# Patient Record
Sex: Female | Born: 1960
Health system: Southern US, Community
[De-identification: ages and names within clinical notes are randomized; demographics above are authoritative.]

## PROBLEM LIST (undated history)

## (undated) DIAGNOSIS — J189 Pneumonia, unspecified organism: Secondary | ICD-10-CM

## (undated) DIAGNOSIS — R918 Other nonspecific abnormal finding of lung field: Secondary | ICD-10-CM

## (undated) DIAGNOSIS — K589 Irritable bowel syndrome without diarrhea: Secondary | ICD-10-CM

## (undated) DIAGNOSIS — F32A Depression, unspecified: Secondary | ICD-10-CM

## (undated) DIAGNOSIS — E119 Type 2 diabetes mellitus without complications: Secondary | ICD-10-CM

## (undated) DIAGNOSIS — I499 Cardiac arrhythmia, unspecified: Secondary | ICD-10-CM

## (undated) DIAGNOSIS — I639 Cerebral infarction, unspecified: Secondary | ICD-10-CM

## (undated) DIAGNOSIS — M1990 Unspecified osteoarthritis, unspecified site: Secondary | ICD-10-CM

## (undated) DIAGNOSIS — G4730 Sleep apnea, unspecified: Secondary | ICD-10-CM

## (undated) DIAGNOSIS — F79 Unspecified intellectual disabilities: Secondary | ICD-10-CM

## (undated) DIAGNOSIS — R011 Cardiac murmur, unspecified: Secondary | ICD-10-CM

## (undated) DIAGNOSIS — K219 Gastro-esophageal reflux disease without esophagitis: Secondary | ICD-10-CM

## (undated) DIAGNOSIS — F419 Anxiety disorder, unspecified: Secondary | ICD-10-CM

## (undated) DIAGNOSIS — I10 Essential (primary) hypertension: Secondary | ICD-10-CM

## (undated) DIAGNOSIS — G43909 Migraine, unspecified, not intractable, without status migrainosus: Secondary | ICD-10-CM

## (undated) DIAGNOSIS — R0600 Dyspnea, unspecified: Secondary | ICD-10-CM

## (undated) DIAGNOSIS — C3490 Malignant neoplasm of unspecified part of unspecified bronchus or lung: Secondary | ICD-10-CM

## (undated) DIAGNOSIS — I219 Acute myocardial infarction, unspecified: Secondary | ICD-10-CM

## (undated) DIAGNOSIS — R739 Hyperglycemia, unspecified: Secondary | ICD-10-CM

## (undated) HISTORY — PX: GLAUCOMA SURGERY: SHX656

## (undated) HISTORY — PX: ABDOMINAL HYSTERECTOMY: SHX81

## (undated) HISTORY — PX: FOOT SURGERY: SHX648

## (undated) HISTORY — DX: Migraine, unspecified, not intractable, without status migrainosus: G43.909

## (undated) HISTORY — DX: Hyperglycemia, unspecified: R73.9

## (undated) HISTORY — DX: Anxiety disorder, unspecified: F41.9

## (undated) HISTORY — DX: Unspecified intellectual disabilities: F79

## (undated) HISTORY — DX: Sleep apnea, unspecified: G47.30

## (undated) HISTORY — PX: EXTERNAL EAR SURGERY: SHX627

## (undated) HISTORY — DX: Type 2 diabetes mellitus without complications: E11.9

## (undated) HISTORY — DX: Malignant neoplasm of unspecified part of unspecified bronchus or lung: C34.90

## (undated) HISTORY — DX: Irritable bowel syndrome, unspecified: K58.9

## (undated) HISTORY — DX: Essential (primary) hypertension: I10

---

## 2001-05-15 ENCOUNTER — Ambulatory Visit (HOSPITAL_COMMUNITY): Admission: RE | Admit: 2001-05-15 | Discharge: 2001-05-15 | Payer: Self-pay | Admitting: Internal Medicine

## 2001-05-15 ENCOUNTER — Encounter: Payer: Self-pay | Admitting: Obstetrics and Gynecology

## 2001-05-27 ENCOUNTER — Encounter: Payer: Self-pay | Admitting: Family Medicine

## 2001-05-27 ENCOUNTER — Ambulatory Visit (HOSPITAL_COMMUNITY): Admission: RE | Admit: 2001-05-27 | Discharge: 2001-05-27 | Payer: Self-pay | Admitting: Family Medicine

## 2001-06-02 ENCOUNTER — Encounter: Payer: Self-pay | Admitting: Internal Medicine

## 2001-06-02 ENCOUNTER — Emergency Department (HOSPITAL_COMMUNITY): Admission: EM | Admit: 2001-06-02 | Discharge: 2001-06-02 | Payer: Self-pay | Admitting: Internal Medicine

## 2001-06-20 ENCOUNTER — Ambulatory Visit (HOSPITAL_COMMUNITY): Admission: RE | Admit: 2001-06-20 | Discharge: 2001-06-20 | Payer: Self-pay | Admitting: Family Medicine

## 2001-06-20 ENCOUNTER — Encounter: Payer: Self-pay | Admitting: Family Medicine

## 2001-08-08 ENCOUNTER — Inpatient Hospital Stay (HOSPITAL_COMMUNITY): Admission: RE | Admit: 2001-08-08 | Discharge: 2001-08-12 | Payer: Self-pay | Admitting: Obstetrics and Gynecology

## 2001-08-10 ENCOUNTER — Encounter: Payer: Self-pay | Admitting: Obstetrics and Gynecology

## 2001-10-08 ENCOUNTER — Encounter: Payer: Self-pay | Admitting: Obstetrics and Gynecology

## 2001-10-08 ENCOUNTER — Ambulatory Visit (HOSPITAL_COMMUNITY): Admission: RE | Admit: 2001-10-08 | Discharge: 2001-10-08 | Payer: Self-pay | Admitting: Obstetrics and Gynecology

## 2001-12-11 ENCOUNTER — Ambulatory Visit (HOSPITAL_COMMUNITY): Admission: RE | Admit: 2001-12-11 | Discharge: 2001-12-11 | Payer: Self-pay | Admitting: Obstetrics and Gynecology

## 2001-12-11 ENCOUNTER — Encounter: Payer: Self-pay | Admitting: Obstetrics and Gynecology

## 2002-01-22 ENCOUNTER — Encounter: Payer: Self-pay | Admitting: Family Medicine

## 2002-01-22 ENCOUNTER — Ambulatory Visit (HOSPITAL_COMMUNITY): Admission: RE | Admit: 2002-01-22 | Discharge: 2002-01-22 | Payer: Self-pay | Admitting: Family Medicine

## 2002-02-13 ENCOUNTER — Ambulatory Visit (HOSPITAL_COMMUNITY): Admission: RE | Admit: 2002-02-13 | Discharge: 2002-02-13 | Payer: Self-pay | Admitting: Internal Medicine

## 2002-02-17 ENCOUNTER — Encounter: Payer: Self-pay | Admitting: Internal Medicine

## 2002-02-17 ENCOUNTER — Ambulatory Visit (HOSPITAL_COMMUNITY): Admission: RE | Admit: 2002-02-17 | Discharge: 2002-02-17 | Payer: Self-pay | Admitting: Internal Medicine

## 2002-02-27 ENCOUNTER — Ambulatory Visit (HOSPITAL_COMMUNITY): Admission: RE | Admit: 2002-02-27 | Discharge: 2002-02-27 | Payer: Self-pay | Admitting: Internal Medicine

## 2002-02-27 ENCOUNTER — Encounter: Payer: Self-pay | Admitting: Internal Medicine

## 2002-03-20 ENCOUNTER — Ambulatory Visit (HOSPITAL_COMMUNITY): Admission: RE | Admit: 2002-03-20 | Discharge: 2002-03-20 | Payer: Self-pay | Admitting: Internal Medicine

## 2002-03-20 ENCOUNTER — Encounter: Payer: Self-pay | Admitting: Internal Medicine

## 2002-04-11 ENCOUNTER — Encounter: Payer: Self-pay | Admitting: Internal Medicine

## 2002-04-11 ENCOUNTER — Ambulatory Visit (HOSPITAL_COMMUNITY): Admission: RE | Admit: 2002-04-11 | Discharge: 2002-04-11 | Payer: Self-pay | Admitting: Internal Medicine

## 2002-05-05 ENCOUNTER — Ambulatory Visit (HOSPITAL_COMMUNITY): Admission: RE | Admit: 2002-05-05 | Discharge: 2002-05-05 | Payer: Self-pay | Admitting: Internal Medicine

## 2002-05-05 HISTORY — PX: COLONOSCOPY: SHX174

## 2002-09-09 ENCOUNTER — Ambulatory Visit (HOSPITAL_COMMUNITY): Admission: RE | Admit: 2002-09-09 | Discharge: 2002-09-09 | Payer: Self-pay | Admitting: Internal Medicine

## 2002-09-09 ENCOUNTER — Encounter: Payer: Self-pay | Admitting: Internal Medicine

## 2002-10-31 ENCOUNTER — Ambulatory Visit (HOSPITAL_COMMUNITY): Admission: RE | Admit: 2002-10-31 | Discharge: 2002-10-31 | Payer: Self-pay | Admitting: Internal Medicine

## 2002-10-31 ENCOUNTER — Encounter: Payer: Self-pay | Admitting: Internal Medicine

## 2002-12-29 ENCOUNTER — Encounter: Payer: Self-pay | Admitting: Family Medicine

## 2002-12-29 ENCOUNTER — Encounter (HOSPITAL_COMMUNITY): Admission: RE | Admit: 2002-12-29 | Discharge: 2003-01-28 | Payer: Self-pay | Admitting: Family Medicine

## 2003-02-05 ENCOUNTER — Ambulatory Visit (HOSPITAL_COMMUNITY): Admission: RE | Admit: 2003-02-05 | Discharge: 2003-02-05 | Payer: Self-pay | Admitting: Podiatry

## 2003-03-13 ENCOUNTER — Other Ambulatory Visit: Admission: RE | Admit: 2003-03-13 | Discharge: 2003-03-13 | Payer: Self-pay | Admitting: Obstetrics and Gynecology

## 2003-03-20 ENCOUNTER — Inpatient Hospital Stay (HOSPITAL_COMMUNITY): Admission: RE | Admit: 2003-03-20 | Discharge: 2003-03-24 | Payer: Self-pay | Admitting: Obstetrics and Gynecology

## 2003-04-15 ENCOUNTER — Ambulatory Visit (HOSPITAL_COMMUNITY): Admission: RE | Admit: 2003-04-15 | Discharge: 2003-04-15 | Payer: Self-pay | Admitting: Obstetrics and Gynecology

## 2003-04-15 ENCOUNTER — Encounter: Payer: Self-pay | Admitting: Obstetrics and Gynecology

## 2003-09-01 ENCOUNTER — Ambulatory Visit (HOSPITAL_COMMUNITY): Admission: RE | Admit: 2003-09-01 | Discharge: 2003-09-01 | Payer: Self-pay | Admitting: Family Medicine

## 2003-10-22 ENCOUNTER — Encounter (HOSPITAL_COMMUNITY): Admission: RE | Admit: 2003-10-22 | Discharge: 2003-11-21 | Payer: Self-pay | Admitting: Internal Medicine

## 2003-10-30 ENCOUNTER — Ambulatory Visit (HOSPITAL_COMMUNITY): Admission: RE | Admit: 2003-10-30 | Discharge: 2003-10-30 | Payer: Self-pay | Admitting: Family Medicine

## 2003-11-23 ENCOUNTER — Ambulatory Visit (HOSPITAL_COMMUNITY): Admission: RE | Admit: 2003-11-23 | Discharge: 2003-11-23 | Payer: Self-pay | Admitting: Internal Medicine

## 2004-04-18 ENCOUNTER — Ambulatory Visit (HOSPITAL_COMMUNITY): Admission: RE | Admit: 2004-04-18 | Discharge: 2004-04-18 | Payer: Self-pay | Admitting: Family Medicine

## 2004-04-23 ENCOUNTER — Emergency Department (HOSPITAL_COMMUNITY): Admission: EM | Admit: 2004-04-23 | Discharge: 2004-04-23 | Payer: Self-pay | Admitting: Emergency Medicine

## 2004-07-21 ENCOUNTER — Ambulatory Visit: Payer: Self-pay | Admitting: Internal Medicine

## 2004-07-27 ENCOUNTER — Ambulatory Visit (HOSPITAL_COMMUNITY): Admission: RE | Admit: 2004-07-27 | Discharge: 2004-07-27 | Payer: Self-pay | Admitting: Family Medicine

## 2004-11-12 ENCOUNTER — Emergency Department (HOSPITAL_COMMUNITY): Admission: EM | Admit: 2004-11-12 | Discharge: 2004-11-12 | Payer: Self-pay | Admitting: Emergency Medicine

## 2005-01-19 ENCOUNTER — Ambulatory Visit (HOSPITAL_COMMUNITY): Admission: RE | Admit: 2005-01-19 | Discharge: 2005-01-19 | Payer: Self-pay | Admitting: Family Medicine

## 2005-02-08 ENCOUNTER — Ambulatory Visit: Payer: Self-pay | Admitting: Internal Medicine

## 2005-02-13 ENCOUNTER — Ambulatory Visit: Payer: Self-pay | Admitting: Internal Medicine

## 2005-03-16 ENCOUNTER — Ambulatory Visit: Payer: Self-pay | Admitting: Internal Medicine

## 2005-04-20 ENCOUNTER — Ambulatory Visit (HOSPITAL_COMMUNITY): Admission: RE | Admit: 2005-04-20 | Discharge: 2005-04-20 | Payer: Self-pay | Admitting: Family Medicine

## 2005-04-21 ENCOUNTER — Emergency Department (HOSPITAL_COMMUNITY): Admission: EM | Admit: 2005-04-21 | Discharge: 2005-04-22 | Payer: Self-pay | Admitting: Emergency Medicine

## 2005-07-13 ENCOUNTER — Ambulatory Visit: Payer: Self-pay | Admitting: Internal Medicine

## 2005-08-21 ENCOUNTER — Emergency Department (HOSPITAL_COMMUNITY): Admission: EM | Admit: 2005-08-21 | Discharge: 2005-08-21 | Payer: Self-pay | Admitting: Emergency Medicine

## 2005-09-17 ENCOUNTER — Emergency Department (HOSPITAL_COMMUNITY): Admission: EM | Admit: 2005-09-17 | Discharge: 2005-09-18 | Payer: Self-pay | Admitting: Emergency Medicine

## 2005-12-08 ENCOUNTER — Emergency Department (HOSPITAL_COMMUNITY): Admission: EM | Admit: 2005-12-08 | Discharge: 2005-12-08 | Payer: Self-pay | Admitting: Emergency Medicine

## 2006-01-31 ENCOUNTER — Observation Stay (HOSPITAL_COMMUNITY): Admission: EM | Admit: 2006-01-31 | Discharge: 2006-02-01 | Payer: Self-pay | Admitting: Emergency Medicine

## 2006-02-17 ENCOUNTER — Emergency Department (HOSPITAL_COMMUNITY): Admission: EM | Admit: 2006-02-17 | Discharge: 2006-02-17 | Payer: Self-pay | Admitting: Emergency Medicine

## 2006-02-26 ENCOUNTER — Ambulatory Visit (HOSPITAL_COMMUNITY): Admission: RE | Admit: 2006-02-26 | Discharge: 2006-02-26 | Payer: Self-pay | Admitting: Family Medicine

## 2006-04-23 ENCOUNTER — Ambulatory Visit (HOSPITAL_COMMUNITY): Admission: RE | Admit: 2006-04-23 | Discharge: 2006-04-23 | Payer: Self-pay | Admitting: Family Medicine

## 2006-09-03 ENCOUNTER — Emergency Department (HOSPITAL_COMMUNITY): Admission: EM | Admit: 2006-09-03 | Discharge: 2006-09-03 | Payer: Self-pay | Admitting: Emergency Medicine

## 2006-09-13 ENCOUNTER — Observation Stay (HOSPITAL_COMMUNITY): Admission: RE | Admit: 2006-09-13 | Discharge: 2006-09-15 | Payer: Self-pay | Admitting: Obstetrics and Gynecology

## 2007-04-25 ENCOUNTER — Ambulatory Visit: Payer: Self-pay | Admitting: Internal Medicine

## 2007-04-29 ENCOUNTER — Ambulatory Visit (HOSPITAL_COMMUNITY): Admission: RE | Admit: 2007-04-29 | Discharge: 2007-04-29 | Payer: Self-pay | Admitting: Family Medicine

## 2007-05-07 ENCOUNTER — Ambulatory Visit (HOSPITAL_COMMUNITY): Admission: RE | Admit: 2007-05-07 | Discharge: 2007-05-07 | Payer: Self-pay | Admitting: Family Medicine

## 2007-06-07 ENCOUNTER — Emergency Department (HOSPITAL_COMMUNITY): Admission: EM | Admit: 2007-06-07 | Discharge: 2007-06-07 | Payer: Self-pay | Admitting: Emergency Medicine

## 2007-06-19 ENCOUNTER — Emergency Department (HOSPITAL_COMMUNITY): Admission: EM | Admit: 2007-06-19 | Discharge: 2007-06-19 | Payer: Self-pay | Admitting: Emergency Medicine

## 2007-07-18 ENCOUNTER — Emergency Department (HOSPITAL_COMMUNITY): Admission: EM | Admit: 2007-07-18 | Discharge: 2007-07-18 | Payer: Self-pay | Admitting: Emergency Medicine

## 2007-08-08 ENCOUNTER — Ambulatory Visit: Payer: Self-pay | Admitting: Internal Medicine

## 2007-08-11 HISTORY — PX: ESOPHAGOGASTRODUODENOSCOPY: SHX1529

## 2007-08-15 ENCOUNTER — Ambulatory Visit (HOSPITAL_COMMUNITY): Admission: RE | Admit: 2007-08-15 | Discharge: 2007-08-15 | Payer: Self-pay | Admitting: Internal Medicine

## 2007-08-15 ENCOUNTER — Ambulatory Visit: Payer: Self-pay | Admitting: Internal Medicine

## 2007-08-20 ENCOUNTER — Encounter (HOSPITAL_COMMUNITY): Admission: RE | Admit: 2007-08-20 | Discharge: 2007-09-19 | Payer: Self-pay | Admitting: Internal Medicine

## 2007-09-17 ENCOUNTER — Ambulatory Visit: Payer: Self-pay | Admitting: Internal Medicine

## 2007-10-09 HISTORY — PX: OTHER SURGICAL HISTORY: SHX169

## 2007-10-25 ENCOUNTER — Emergency Department (HOSPITAL_COMMUNITY): Admission: EM | Admit: 2007-10-25 | Discharge: 2007-10-25 | Payer: Self-pay | Admitting: Emergency Medicine

## 2007-10-28 ENCOUNTER — Ambulatory Visit: Payer: Self-pay | Admitting: Gastroenterology

## 2007-11-08 ENCOUNTER — Encounter: Payer: Self-pay | Admitting: Internal Medicine

## 2007-11-08 ENCOUNTER — Ambulatory Visit (HOSPITAL_COMMUNITY): Admission: RE | Admit: 2007-11-08 | Discharge: 2007-11-08 | Payer: Self-pay | Admitting: Internal Medicine

## 2007-11-08 ENCOUNTER — Ambulatory Visit: Payer: Self-pay | Admitting: Internal Medicine

## 2007-11-08 HISTORY — PX: COLONOSCOPY: SHX174

## 2007-12-25 ENCOUNTER — Emergency Department (HOSPITAL_COMMUNITY): Admission: EM | Admit: 2007-12-25 | Discharge: 2007-12-25 | Payer: Self-pay | Admitting: Emergency Medicine

## 2008-02-19 ENCOUNTER — Ambulatory Visit: Admission: RE | Admit: 2008-02-19 | Discharge: 2008-02-19 | Payer: Self-pay | Admitting: Family Medicine

## 2008-04-08 ENCOUNTER — Ambulatory Visit: Payer: Self-pay | Admitting: Internal Medicine

## 2008-04-15 ENCOUNTER — Other Ambulatory Visit: Payer: Self-pay

## 2008-04-15 ENCOUNTER — Inpatient Hospital Stay (HOSPITAL_COMMUNITY): Admission: RE | Admit: 2008-04-15 | Discharge: 2008-04-18 | Payer: Self-pay | Admitting: *Deleted

## 2008-04-15 ENCOUNTER — Ambulatory Visit: Payer: Self-pay | Admitting: *Deleted

## 2008-04-15 ENCOUNTER — Other Ambulatory Visit: Payer: Self-pay | Admitting: Emergency Medicine

## 2008-05-08 ENCOUNTER — Ambulatory Visit: Payer: Self-pay | Admitting: Internal Medicine

## 2008-05-12 ENCOUNTER — Ambulatory Visit (HOSPITAL_COMMUNITY): Admission: RE | Admit: 2008-05-12 | Discharge: 2008-05-12 | Payer: Self-pay | Admitting: Family Medicine

## 2008-05-22 ENCOUNTER — Inpatient Hospital Stay (HOSPITAL_COMMUNITY): Admission: EM | Admit: 2008-05-22 | Discharge: 2008-05-23 | Payer: Self-pay | Admitting: Cardiology

## 2008-05-22 ENCOUNTER — Encounter: Payer: Self-pay | Admitting: Emergency Medicine

## 2008-05-22 ENCOUNTER — Ambulatory Visit: Payer: Self-pay | Admitting: *Deleted

## 2008-10-30 ENCOUNTER — Ambulatory Visit: Payer: Self-pay | Admitting: Cardiology

## 2008-10-30 ENCOUNTER — Encounter: Payer: Self-pay | Admitting: Family Medicine

## 2008-10-30 ENCOUNTER — Ambulatory Visit (HOSPITAL_COMMUNITY): Admission: RE | Admit: 2008-10-30 | Discharge: 2008-10-30 | Payer: Self-pay | Admitting: Family Medicine

## 2008-11-16 ENCOUNTER — Telehealth (INDEPENDENT_AMBULATORY_CARE_PROVIDER_SITE_OTHER): Payer: Self-pay

## 2008-11-25 ENCOUNTER — Emergency Department (HOSPITAL_COMMUNITY): Admission: EM | Admit: 2008-11-25 | Discharge: 2008-11-25 | Payer: Self-pay | Admitting: Emergency Medicine

## 2009-01-15 DIAGNOSIS — M25569 Pain in unspecified knee: Secondary | ICD-10-CM | POA: Insufficient documentation

## 2009-01-15 DIAGNOSIS — I10 Essential (primary) hypertension: Secondary | ICD-10-CM | POA: Insufficient documentation

## 2009-01-15 DIAGNOSIS — K5909 Other constipation: Secondary | ICD-10-CM | POA: Insufficient documentation

## 2009-01-15 DIAGNOSIS — F79 Unspecified intellectual disabilities: Secondary | ICD-10-CM | POA: Insufficient documentation

## 2009-01-15 DIAGNOSIS — K649 Unspecified hemorrhoids: Secondary | ICD-10-CM | POA: Insufficient documentation

## 2009-01-15 DIAGNOSIS — M549 Dorsalgia, unspecified: Secondary | ICD-10-CM | POA: Insufficient documentation

## 2009-01-15 DIAGNOSIS — Z86010 Personal history of colonic polyps: Secondary | ICD-10-CM | POA: Insufficient documentation

## 2009-01-15 DIAGNOSIS — F411 Generalized anxiety disorder: Secondary | ICD-10-CM | POA: Insufficient documentation

## 2009-01-15 DIAGNOSIS — G43909 Migraine, unspecified, not intractable, without status migrainosus: Secondary | ICD-10-CM | POA: Insufficient documentation

## 2009-01-15 DIAGNOSIS — K219 Gastro-esophageal reflux disease without esophagitis: Secondary | ICD-10-CM | POA: Insufficient documentation

## 2009-01-15 DIAGNOSIS — K589 Irritable bowel syndrome without diarrhea: Secondary | ICD-10-CM | POA: Insufficient documentation

## 2009-01-15 DIAGNOSIS — E119 Type 2 diabetes mellitus without complications: Secondary | ICD-10-CM | POA: Insufficient documentation

## 2009-02-17 ENCOUNTER — Ambulatory Visit: Payer: Self-pay | Admitting: Internal Medicine

## 2009-02-17 DIAGNOSIS — F70 Mild intellectual disabilities: Secondary | ICD-10-CM | POA: Insufficient documentation

## 2009-02-17 DIAGNOSIS — H409 Unspecified glaucoma: Secondary | ICD-10-CM | POA: Insufficient documentation

## 2009-04-14 ENCOUNTER — Emergency Department (HOSPITAL_COMMUNITY): Admission: EM | Admit: 2009-04-14 | Discharge: 2009-04-14 | Payer: Self-pay | Admitting: Emergency Medicine

## 2009-04-21 ENCOUNTER — Encounter: Payer: Self-pay | Admitting: Gastroenterology

## 2009-04-23 ENCOUNTER — Emergency Department (HOSPITAL_COMMUNITY): Admission: EM | Admit: 2009-04-23 | Discharge: 2009-04-23 | Payer: Self-pay | Admitting: Emergency Medicine

## 2009-05-14 ENCOUNTER — Ambulatory Visit: Admission: RE | Admit: 2009-05-14 | Discharge: 2009-05-14 | Payer: Self-pay | Admitting: Family Medicine

## 2009-05-14 ENCOUNTER — Ambulatory Visit (HOSPITAL_COMMUNITY): Admission: RE | Admit: 2009-05-14 | Discharge: 2009-05-14 | Payer: Self-pay | Admitting: Family Medicine

## 2009-07-14 ENCOUNTER — Ambulatory Visit (HOSPITAL_COMMUNITY): Admission: RE | Admit: 2009-07-14 | Discharge: 2009-07-14 | Payer: Self-pay | Admitting: Family Medicine

## 2010-04-19 ENCOUNTER — Ambulatory Visit: Payer: Self-pay | Admitting: Internal Medicine

## 2010-04-21 DIAGNOSIS — R1319 Other dysphagia: Secondary | ICD-10-CM | POA: Insufficient documentation

## 2010-04-21 DIAGNOSIS — K921 Melena: Secondary | ICD-10-CM | POA: Insufficient documentation

## 2010-05-10 ENCOUNTER — Ambulatory Visit: Payer: Self-pay | Admitting: Internal Medicine

## 2010-05-10 ENCOUNTER — Ambulatory Visit (HOSPITAL_COMMUNITY)
Admission: RE | Admit: 2010-05-10 | Discharge: 2010-05-10 | Payer: Self-pay | Source: Home / Self Care | Admitting: Internal Medicine

## 2010-05-10 HISTORY — PX: COLONOSCOPY: SHX174

## 2010-05-10 HISTORY — PX: ESOPHAGOGASTRODUODENOSCOPY: SHX1529

## 2010-05-12 ENCOUNTER — Encounter: Payer: Self-pay | Admitting: Internal Medicine

## 2010-05-24 ENCOUNTER — Ambulatory Visit (HOSPITAL_COMMUNITY): Admission: RE | Admit: 2010-05-24 | Discharge: 2010-05-24 | Payer: Self-pay | Admitting: Family Medicine

## 2010-06-09 ENCOUNTER — Encounter: Payer: Self-pay | Admitting: Gastroenterology

## 2010-06-09 ENCOUNTER — Ambulatory Visit: Payer: Self-pay | Admitting: Internal Medicine

## 2010-06-09 DIAGNOSIS — R112 Nausea with vomiting, unspecified: Secondary | ICD-10-CM | POA: Insufficient documentation

## 2010-06-09 HISTORY — DX: Nausea with vomiting, unspecified: R11.2

## 2010-06-13 ENCOUNTER — Ambulatory Visit (HOSPITAL_COMMUNITY)
Admission: RE | Admit: 2010-06-13 | Discharge: 2010-06-13 | Payer: Self-pay | Source: Home / Self Care | Admitting: Internal Medicine

## 2010-07-30 ENCOUNTER — Encounter: Payer: Self-pay | Admitting: Family Medicine

## 2010-07-31 ENCOUNTER — Encounter: Payer: Self-pay | Admitting: Family Medicine

## 2010-08-09 NOTE — Letter (Signed)
Summary: PHYSICIAN ORDERS  PHYSICIAN ORDERS   Imported By: Rexene Alberts 06/09/2010 15:38:21  _____________________________________________________________________  External Attachment:    Type:   Image     Comment:   External Document

## 2010-08-09 NOTE — Letter (Signed)
Summary: TCS/EGDPOSSED ORDER  TCS/EGDPOSSED ORDER   Imported By: Ave Filter 04/19/2010 11:04:48  _____________________________________________________________________  External Attachment:    Type:   Image     Comment:   External Document  Appended Document: TCS/EGDPOSSED ORDER Procedure scheduled for 05/10/10@9 :30am

## 2010-08-09 NOTE — Assessment & Plan Note (Signed)
Summary: 75YR F/U,GERD,IBS,COLON POLYPS/LAW   Visit Type:  Follow-up Visit Primary Care Provider:  [name]  Chief Complaint:  F/U GERD/ IBS.  History of Present Illness: 50 year old African American  lady with multiple medical problems including GERD here for followup. Now reports esophageal dysphagia to solids and worsening constipation despite taking MiraLax multiple times daily; she cites  blood per rectum recently with straining.   History of  colonic adenoma. Slated for surveilance colonoscopy 2014. Prior EGD does not demonstrate a large hernia but no obstructing lesion. She is on multiple medications.  On omeprazole 20 mg orally daily with fairly good control of reflux symptoms but maybe not as well more recently as previously.  Patient does not like utilizing MiraLax for constipation. She takes it but does not feel it works were well. She reported takes it 2-3 times daily without much in the way of any good results.           i  Current Medications (verified): 1)  Mirtazapine 30 Mg Tabs (Mirtazapine) .... At Bedtime 2)  K-Tabs 10 Meq Cr-Tabs (Potassium Chloride) .... Once Daily 3)  Nabumetone 500 Mg Tabs (Nabumetone) .... Two Times A Day 4)  Voltaren 1 % Gel (Diclofenac Sodium) .... As Needed 5)  Lisinopril 5 Mg Tabs (Lisinopril) .... Once Daily 6)  Furosemide 40 Mg Tabs (Furosemide) .... Every Morning 7)  Omeprazole 20 Mg Tbec (Omeprazole) .... One By Mouth 30 Mins Before Breakfast Daily 8)  Oster Cal 500 + D .... Three Times A Day 9)  Alprazolam 0.25 Mg Tabs (Alprazolam) .... Two Times A Day 10)  Loratadine 10 Mg Tabs (Loratadine) .... Once Daily As Needed 11)  Flovent Diskus 50 Mcg/blist Aepb (Fluticasone Propionate (Inhal)) .... As Directed 12)  Sertraline Hcl 100 Mg Tabs (Sertraline Hcl) .... Two Tablets Daily 13)  Invega 9 Mg Xr24h-Tab (Paliperidone) .... Take 1 Tablet By Mouth Once A Day 14)  Lamictal 100 Mg Tabs (Lamotrigine) .... Take 1 Tablet By Mouth Once A  Day 15)  Strattera 60 Mg Caps (Atomoxetine Hcl) .... One Capsule Daily 16)  Trazodone Hcl 50 Mg Tabs (Trazodone Hcl) .... Take 1 Tab By Mouth At Bedtime 17)  Hydroxyzine Hcl 50 Mg Tabs (Hydroxyzine Hcl) .... Two Tablets At Bedtime 18)  Aminofen 325 Mg Tabs (Acetaminophen) .... As Needed 19)  Qc Tussin .... As Needed 20)  Mild of Magnesia .... As Needed 21)  Anti-Diarrhea .... As Needed 22)  Antacid  Liquid .... As Needed 23)  Dimenhydrinate 50 Mg Tabs (Dimenhydrinate) .... As Needed 24)  Hemorrhoidal Ointment .... As Needed 25)  Tramadol Hcl 50 Mg Tabs (Tramadol Hcl) .... As Needed 26)  Miralax .... As Needed  Allergies (verified): 1)  ! Penicillin 2)  ! Aspirin 3)  ! Nsaids  Past History:  Past Surgical History: Last updated: March 15, 2009 BILATERAL EAR SURGERY COMPLETE HYSTERECTOMY FOOT SURGERY wisdom teeth extraction glaucoma  Family History: Last updated: 03-15-09 Father:deceased -MI  Mother: deceased-CVA Siblings:19 siblings  Family History of Colon Cancer: aunt  Social History: Last updated: 03-15-2009 Marital Status: no Children: 3 Occupation: disability Patient currently smokes.  Alcohol Use - no  Risk Factors: Smoking Status: current (03/15/09)  Past Medical History: Hypertension Mental Retardation Hyperglycemia  Review of Systems       denies chest pain dyspnea on exertion fever or chills. Otherwise as in history of present illness  Vital Signs:  Patient profile:   50 year old female Height:      70.5 inches  Weight:      255 pounds BMI:     36.20 Temp:     98.7 degrees F oral Pulse rate:   80 / minute BP sitting:   140 / 82  (left arm) Cuff size:   regular  Vitals Entered By: Cloria Spring LPN (April 19, 2010 10:30 AM)  Physical Exam  General:  pleasant alert lady in no acute distress Lungs:  clear to auscultation Heart:  regular rate and rhythm without murmur gallop rub Abdomen:  nondistended positive bowel sounds soft nontender  mass or organomegaly Rectal:  deferred until time of colonoscopy  Impression & Recommendations: Impression: A 50 year old lady with esophageal dysphagia in the setting of long-standing GERD. Worsening constipation apparently refractory to MiraLax. Now with intermittent blood per rectum. Polypharmacy is likely contributing to the clinical picture.  Recommendations: Diagnostic EGD with esophageal dilation as appropriate  in the near future. Risks, benefits, limitations, alternatives and imponderables reviewed; her questions have been answered.  In addition, we'll go ahead and perform a diagnostic colonoscopy this time as well. This procedure was also discussed. She is agreeable; we'll make further recommendations after endoscopic evaluation has been carried out.  Appended Document: Orders Update    Clinical Lists Changes  Problems: Added new problem of HEMATOCHEZIA (ICD-578.1) Added new problem of OTHER DYSPHAGIA (NWG-956.21) Orders: Added new Service order of Est. Patient Level IV (30865) - Signed

## 2010-08-09 NOTE — Letter (Signed)
Summary: Patient Notice, Colon Biopsy Results  [facility]  [address]   [place], [place] [address]   Phone: [phone]  Fax: [phone]       May 12, 2010   [name] [address] [place], [place]  [address] Aug 07, 1960    Dear [name],  I am pleased to inform you that the biopsies taken during your recent colonoscopy did not show any evidence of cancer upon pathologic examination.  Additional information/recommendations:  Please call [phone] to schedule a return visit to review your condition.  Continue with the treatment plan as outlined on the day of your exam.  Please call us if you are having persistent problems or have questions about your condition that have not been fully answered at this time.  Sincerely,    R. Roetta Sessions MD, FACP [facility] Ph: [phone]    Fax: [phone]   Appended Document: Patient Notice, Colon Biopsy Results letter mailed to pt  Appended Document: Patient Notice, Colon Biopsy Results pt is aware of next appt for 12/1 @ 130pm w/AS

## 2010-08-09 NOTE — Letter (Signed)
Summary: RAD ORDER/UGI/BPE  RAD ORDER/UGI/BPE   Imported By: Rexene Alberts 06/09/2010 14:25:29  _____________________________________________________________________  External Attachment:    Type:   Image     Comment:   External Document

## 2010-08-11 NOTE — Assessment & Plan Note (Signed)
Summary: PP FU IN 4-6 WEEKS WITH EXT/SS   Visit Type:  Follow-up Visit Primary Care Lofton Leon:  [name]  CC:  F/U .  History of Present Illness: [name] is a pleasant 50 year old female with a hx significant for esophageal dysphagia and constipation. She recently underwent EGD/colon on 11/1. She is here for follow-up. She reports +nausea that occurs in morning, lays down 3-4 hours and feels better, +emesis X 3-4 weeks, +headached X 3-4 weeks. Reports continued dysphagia with chicken, bread, tougher textures. reports 3-4 soft, losse stools daily. started dexilant and amitiza 24 micrograms two times a day after endoscopy. appears symptoms of nausea, headache, and loose BMs started with onset of amitiza. reflux controlled.   11/1:Noncritical Schatzki ring, otherwise normal esophagus status post     Maloney dilation. 2. Small hiatal hernia, otherwise normal stomach, D1, D2. 3. Colonoscopy findings, prep inadequate exam, attempted/incomplete     biopsy of rectum performed.  Current Medications (verified): 1)  Mirtazapine 30 Mg Tabs (Mirtazapine) .... At Bedtime 2)  K-Tabs 10 Meq Cr-Tabs (Potassium Chloride) .... Once Daily 3)  Nabumetone 500 Mg Tabs (Nabumetone) .... Two Times A Day 4)  Voltaren 1 % Gel (Diclofenac Sodium) .... As Needed 5)  Lisinopril 5 Mg Tabs (Lisinopril) .... Once Daily 6)  Furosemide 40 Mg Tabs (Furosemide) .... Every Morning 7)  Omeprazole 20 Mg Tbec (Omeprazole) .... One By Mouth 30 Mins Before Breakfast Daily 8)  Oster Cal 500 + D .... Three Times A Day 9)  Alprazolam 0.25 Mg Tabs (Alprazolam) .... Two Times A Day 10)  Loratadine 10 Mg Tabs (Loratadine) .... Once Daily As Needed 11)  Sertraline Hcl 100 Mg Tabs (Sertraline Hcl) .... Two Tablets Daily 12)  Invega 9 Mg Xr24h-Tab (Paliperidone) .... Take 1 Tablet By Mouth Once A Day 13)  Lamictal 100 Mg Tabs (Lamotrigine) .... Take 1 Tablet By Mouth Once A Day 14)  Strattera 60 Mg Caps (Atomoxetine Hcl) .... One  Capsule Daily 15)  Trazodone Hcl 50 Mg Tabs (Trazodone Hcl) .... Take 1 Tab By Mouth At Bedtime 16)  Hydroxyzine Hcl 50 Mg Tabs (Hydroxyzine Hcl) .... One To Two Tablets At Bedtime 17)  Aminofen 325 Mg Tabs (Acetaminophen) .... As Needed 18)  Qc Tussin .... As Needed 19)  Mild of Magnesia .... As Needed 20)  Anti-Diarrhea .... As Needed 21)  Antacid  Liquid .... As Needed 22)  Dimenhydrinate 50 Mg Tabs (Dimenhydrinate) .... As Needed 23)  Hemorrhoidal Ointment .... As Needed 24)  Tramadol Hcl 50 Mg Tabs (Tramadol Hcl) .... As Needed 25)  Miralax .... As Needed 26)  Dexilant 60 Mg Cpdr (Dexlansoprazole) .... Take 1 Tablet By Mouth Once A Day 27)  Amitiza 24 Mcg Caps (Lubiprostone) .... One Tablet Twice Daily With Meals  Allergies (verified): 1)  ! Penicillin 2)  ! Aspirin 3)  ! Nsaids 4)  ! Tylenol 5)  ! * Excedrin  Review of Systems General:  Denies fever, chills, and anorexia. Eyes:  Denies blurring, irritation, and discharge. ENT:  Denies sore throat, hoarseness, and difficulty swallowing. CV:  Denies chest pains and syncope. Resp:  Denies dyspnea at rest, cough, and wheezing. GI:  Complains of difficulty swallowing, nausea, vomiting, and diarrhea; denies pain on swallowing, indigestion/heartburn, abdominal pain, and constipation. GU:  Denies urinary burning, blood in urine, and urinary frequency. Denise:  Denies joint pain / LOM, joint stiffness, and joint deformity. Derm:  Denies rash, itching, and dry skin. Neuro:  Complains of headache; denies weakness  and syncope. Psych:  Denies depression and anxiety. Endo:  Denies cold intolerance and heat intolerance. Heme:  Denies bruising and bleeding.  Vital Signs:  Patient profile:   50 year old female Height:      70.5 inches Weight:      255 pounds BMI:     36.20 Temp:     98.3 degrees F oral Pulse rate:   80 / minute BP sitting:   138 / 88  (left arm) Cuff size:   large  Vitals Entered By: Cloria Spring LPN (June 09, 2010 1:29 PM)  Physical Exam  General:  Well developed, well nourished, no acute distress.obese.   Lungs:  Clear throughout to auscultation. Heart:  Regular rate and rhythm; no murmurs, rubs,  or bruits. Abdomen:  normal bowel sounds, obese, without guarding, without rebound, no distesion, no masses, and no hepatomegally or splenomegaly.   Msk:  Symmetrical with no gross deformities. Normal posture. Pulses:  Normal pulses noted. Extremities:  No clubbing, cyanosis, edema or deformities noted. Neurologic:  Alert and  oriented x4;  grossly normal neurologically. Psych:  Alert and cooperative. Normal mood and affect.  Impression & Recommendations:  Problem # 1:  OTHER DYSPHAGIA (ICD-60.66)  50 year old [place] American female who continues to complain of dysphagia s/p EGD/ED on 11/1. Will set up for UGI/BPE to assess.   Orders: Est. Patient Level II ([address])  Problem # 2:  GASTROESOPHAGEAL REFLUX DISEASE, CHRONIC (ICD-530.81) well-controlled on dexilant. continue current plan  Problem # 3:  NAUSEA AND VOMITING (ICD-787.01)  N/V and increase in stool frequency, as well as headache, since starting Amitiza. ?side effect? Will decrease to 24 micrograms once daily. Pt to call with any further N/V, may need GES. hx of diabets (last GES in 2009 was nl, as of note).   Orders: Est. Patient Level II ([address])  Patient Instructions: 1)  Take a probiotic daily (samples of Align given) 2)  ONLY take Amitiza once a day. Call if continued diarrhea, nausea, or headaches. We may need to adjust the dose more. 3)  The medication list was reviewed and reconciled.  All changed / newly prescribed medications were explained.  A complete medication list was provided to the patient / caregiver.   Appended Document: PP FU IN 4-6 WEEKS WITH EXT/SS can we have progress report on pt. last visit was c/o side effects from [place].   Appended Document: PP FU IN 4-6 WEEKS WITH EXT/SS tried to call number  listed in chart and was told pt doesnt live there anymore. No other number to call.

## 2010-09-23 ENCOUNTER — Encounter (INDEPENDENT_AMBULATORY_CARE_PROVIDER_SITE_OTHER): Payer: Self-pay | Admitting: *Deleted

## 2010-09-27 NOTE — Letter (Signed)
Summary: Recall Office Visit  [facility]  [address]   [place], [place] [address]   Phone: [phone]  Fax: [phone]      September 23, 2010   [name] [address] [place], [place]  [address] 26-Mar-1961   Dear [name],   According to our records, it is time for you to schedule a follow-up office visit with [place].   At your convenience, please call [phone] to schedule an office visit. If you have any questions, concerns, or feel that this letter is in error, we would appreciate your call.   Sincerely,    Diana Eves  [facility] Ph: [phone]   Fax: [phone]

## 2010-10-12 ENCOUNTER — Ambulatory Visit: Payer: Self-pay | Admitting: Gastroenterology

## 2010-10-13 LAB — CBC
HCT: 37.4 % (ref 36.0–46.0)
Hemoglobin: 12.4 g/dL (ref 12.0–15.0)
Platelets: 174 10*3/uL (ref 150–400)
RBC: 3.79 MIL/uL — ABNORMAL LOW (ref 3.87–5.11)
RBC: 3.98 MIL/uL (ref 3.87–5.11)
RDW: 13.5 % (ref 11.5–15.5)
WBC: 5 10*3/uL (ref 4.0–10.5)
WBC: 5.6 10*3/uL (ref 4.0–10.5)

## 2010-10-13 LAB — DIFFERENTIAL
Basophils Absolute: 0 10*3/uL (ref 0.0–0.1)
Lymphocytes Relative: 51 % — ABNORMAL HIGH (ref 12–46)
Lymphocytes Relative: 51 % — ABNORMAL HIGH (ref 12–46)
Lymphs Abs: 2.5 10*3/uL (ref 0.7–4.0)
Lymphs Abs: 2.9 10*3/uL (ref 0.7–4.0)
Monocytes Absolute: 0.5 10*3/uL (ref 0.1–1.0)
Monocytes Relative: 10 % (ref 3–12)
Monocytes Relative: 9 % (ref 3–12)
Neutro Abs: 1.9 10*3/uL (ref 1.7–7.7)
Neutrophils Relative %: 38 % — ABNORMAL LOW (ref 43–77)

## 2010-10-13 LAB — URINALYSIS, ROUTINE W REFLEX MICROSCOPIC
Glucose, UA: NEGATIVE mg/dL
Leukocytes, UA: NEGATIVE
pH: 6 (ref 5.0–8.0)

## 2010-10-13 LAB — RAPID URINE DRUG SCREEN, HOSP PERFORMED
Cocaine: NOT DETECTED
Tetrahydrocannabinol: NOT DETECTED

## 2010-10-13 LAB — URINE MICROSCOPIC-ADD ON

## 2010-10-13 LAB — BASIC METABOLIC PANEL
BUN: 1 mg/dL — ABNORMAL LOW (ref 6–23)
Calcium: 8.8 mg/dL (ref 8.4–10.5)
Chloride: 101 mEq/L (ref 96–112)
GFR calc Af Amer: >60 mL/min (ref >60)
GFR calc Af Amer: >60 mL/min (ref >60)
GFR calc non Af Amer: >60 mL/min (ref >60)
GFR calc non Af Amer: >60 mL/min (ref >60)
Potassium: 3.4 mEq/L — ABNORMAL LOW (ref 3.5–5.1)
Sodium: 138 mEq/L (ref 135–145)

## 2010-10-13 LAB — ETHANOL: Alcohol, Ethyl (B): <5 mg/dL (ref 0–10)

## 2010-10-18 LAB — CBC
HCT: 33.4 % — ABNORMAL LOW (ref 36.0–46.0)
Hemoglobin: 11.9 g/dL — ABNORMAL LOW (ref 12.0–15.0)
RDW: 13.5 % (ref 11.5–15.5)

## 2010-10-18 LAB — DIFFERENTIAL
Basophils Absolute: 0 10*3/uL (ref 0.0–0.1)
Basophils Relative: 1 % (ref 0–1)
Monocytes Relative: 8 % (ref 3–12)
Neutro Abs: 2 10*3/uL (ref 1.7–7.7)
Neutrophils Relative %: 30 % — ABNORMAL LOW (ref 43–77)

## 2010-10-18 LAB — COMPREHENSIVE METABOLIC PANEL
Alkaline Phosphatase: 87 U/L (ref 39–117)
BUN: 3 mg/dL — ABNORMAL LOW (ref 6–23)
Glucose, Bld: 107 mg/dL — ABNORMAL HIGH (ref 70–99)
Potassium: 3.2 mEq/L — ABNORMAL LOW (ref 3.5–5.1)
Total Bilirubin: 0.2 mg/dL — ABNORMAL LOW (ref 0.3–1.2)
Total Protein: 6.1 g/dL (ref 6.0–8.3)

## 2010-10-18 LAB — URINALYSIS, ROUTINE W REFLEX MICROSCOPIC
Nitrite: NEGATIVE
Specific Gravity, Urine: 1.01 (ref 1.005–1.030)
Urobilinogen, UA: 0.2 mg/dL (ref 0.0–1.0)

## 2010-10-19 ENCOUNTER — Ambulatory Visit (INDEPENDENT_AMBULATORY_CARE_PROVIDER_SITE_OTHER): Payer: Medicare Other | Admitting: Gastroenterology

## 2010-10-19 ENCOUNTER — Encounter: Payer: Self-pay | Admitting: Gastroenterology

## 2010-10-19 DIAGNOSIS — K589 Irritable bowel syndrome without diarrhea: Secondary | ICD-10-CM

## 2010-10-19 DIAGNOSIS — K5900 Constipation, unspecified: Secondary | ICD-10-CM

## 2010-10-19 DIAGNOSIS — Z86010 Personal history of colonic polyps: Secondary | ICD-10-CM

## 2010-10-19 DIAGNOSIS — R112 Nausea with vomiting, unspecified: Secondary | ICD-10-CM

## 2010-10-19 DIAGNOSIS — K219 Gastro-esophageal reflux disease without esophagitis: Secondary | ICD-10-CM

## 2010-10-19 NOTE — Progress Notes (Signed)
Primary Care Physician: [name], MD  Primary Gastroenterologist:  Dr. Roetta Sessions  Chief Complaint  Patient presents with  . Follow-up    abd pain    HPI: [name] is a 50 y.o. female here for followup of abdominal pain.C/O BM only 2-3 per month. Taking Amitiza once daily. Nausea with twice daily. Taking liquid for constipation but not helping. Been on it for two years. Has no idea what she is taking and called [facility] and they say she is not on any liquid meds. No melena, brbpr. Vomited last month. Some nausea most days. In evenings is the worse and with meals. Heartburn daily.   Current Outpatient Prescriptions  Medication Sig Dispense Refill  . ALPRAZolam (XANAX) 0.25 MG tablet Take 0.25 mg by mouth 2 (two) times daily as needed.        Marland Kitchen atomoxetine (STRATTERA) 100 MG capsule Take 100 mg by mouth daily.        . calcium-vitamin D (OSCAL WITH D) 500-200 MG-UNIT per tablet Take 1 tablet by mouth 3 (three) times daily.        Marland Kitchen dexlansoprazole (DEXILANT) 60 MG capsule Take 60 mg by mouth daily.        . furosemide (LASIX) 40 MG tablet Take 80 mg by mouth daily.        . hydrOXYzine (ATARAX) 50 MG tablet Take 50 mg by mouth daily.        Marland Kitchen lamoTRIgine (LAMICTAL) 100 MG tablet Take 100 mg by mouth daily.        Marland Kitchen lisinopril (PRINIVIL,ZESTRIL) 5 MG tablet Take 5 mg by mouth daily.        Marland Kitchen lubiprostone (AMITIZA) 24 MCG capsule Take 24 mcg by mouth daily with breakfast.        . mirtazapine (REMERON) 30 MG tablet Take 30 mg by mouth at bedtime.        . nabumetone (RELAFEN) 500 MG tablet Take 500 mg by mouth 2 (two) times daily.        . paliperidone (INVEGA) 9 MG 24 hr tablet Take 9 mg by mouth every morning.        . potassium chloride (KLOR-CON) 10 MEQ CR tablet Take 10 mEq by mouth daily.        . sertraline (ZOLOFT) 100 MG tablet Take 200 mg by mouth daily.        . traZODone (DESYREL) 50 MG tablet Take 50 mg by mouth at bedtime.          Allergies as of 10/19/2010 -  Review Complete 10/19/2010  Allergen Reaction Noted  . Acetaminophen    . Aspirin    . Aspirin-acetaminophen-caffeine    . Nsaids    . Penicillins      ROS:  General: Negative for anorexia, weight loss, fever, chills, fatigue, weakness. ENT: Negative for hoarseness, difficulty swallowing , nasal congestion. CV: Negative for chest pain, angina, palpitations, dyspnea on exertion, peripheral edema.  Respiratory: Negative for dyspnea at rest, dyspnea on exertion, cough, sputum, wheezing.  GI: See history of present illness. GU:  Negative for dysuria, hematuria, urinary incontinence, urinary frequency, nocturnal urination.  Endo: Negative for unusual weight change.    Physical Examination:   BP 131/85  Pulse 98  Temp 96.9 F (36.1 C)  Ht 5' 10.5" (1.791 m)  Wt 242 lb (109.77 kg)  BMI 34.23 kg/m2  SpO2 96%  General: Well-nourished, well-developed in no acute distress.  Eyes: No icterus. Mouth: Oropharyngeal mucosa moist and  pink , no lesions erythema or exudate. Lungs: Clear to auscultation bilaterally.  Heart: Regular rate and rhythm, no murmurs rubs or gallops.  Abdomen: Bowel sounds are normal, nontender, nondistended, no hepatosplenomegaly or masses, no abdominal bruits or hernia , no rebound or guarding.   Extremities: No lower extremity edema.  Neuro: Alert and oriented x 4   Skin: Warm and dry, no jaundice.   Psych: Alert and cooperative, normal mood and affect.

## 2010-10-20 ENCOUNTER — Encounter: Payer: Self-pay | Admitting: Gastroenterology

## 2010-10-20 DIAGNOSIS — K5900 Constipation, unspecified: Secondary | ICD-10-CM | POA: Insufficient documentation

## 2010-10-20 NOTE — Assessment & Plan Note (Signed)
Daily heartburn. Switch to Nexium 40 mg daily.

## 2010-10-20 NOTE — Assessment & Plan Note (Signed)
See assessment and plan for nausea/vomiting

## 2010-10-20 NOTE — Assessment & Plan Note (Signed)
Last colonoscopy was incomplete because of poor prep. Last complete colonoscopy was in 2009. Will discuss with Dr. Jena Gauss regarding timing of next colonoscopy.

## 2010-10-20 NOTE — Assessment & Plan Note (Signed)
Abdominal pain is vague and likely due to irritable bowel syndrome and constipation. We'll treat constipation and go from there. She'll call us and she has worsening abdominal pain, fever, vomiting, blood in the stool.

## 2010-10-20 NOTE — Assessment & Plan Note (Addendum)
Would like to see where her symptoms settle out after her constipation is adequately addressed. We'll increase her Amitiza to twice daily. Previously it was felt that her nausea may be a side effect of medication and was subsequently decreased to once daily. She's really noted no improvement other symptoms.

## 2010-10-26 ENCOUNTER — Encounter: Payer: Self-pay | Admitting: Internal Medicine

## 2010-10-26 NOTE — Progress Notes (Signed)
Pt is aware of OV on 01/25/11 @ 10am with LSL

## 2010-11-02 ENCOUNTER — Other Ambulatory Visit: Payer: Self-pay | Admitting: Internal Medicine

## 2010-11-17 ENCOUNTER — Encounter: Payer: Self-pay | Admitting: Urgent Care

## 2010-11-17 ENCOUNTER — Ambulatory Visit (INDEPENDENT_AMBULATORY_CARE_PROVIDER_SITE_OTHER): Payer: Medicare Other | Admitting: Urgent Care

## 2010-11-17 DIAGNOSIS — K5900 Constipation, unspecified: Secondary | ICD-10-CM

## 2010-11-17 NOTE — Progress Notes (Signed)
Referring Provider: [name], MD Primary Care Physician:  [name], MD, MD Primary Gastroenterologist:  Dr. Jena Gauss  Chief Complaint  Patient presents with  . Constipation    last bm was last week    HPI:  [name] is a 50 y.o. female here for follow up for constipation.  Was seen by Tana Coast, PA last month.  C/o "Bowels locked up."  Only had 1 BM last week.  C/o hard stools.  C/o suprapubic abd pain.  Takes amitiza daily.  She is not taking Miralax.  Not using any fiber or probiotic.  Denies any nausea, some vomiting x2 this week.  Denies fever or chills.  Denies rectal bleeding or melena.    Past Medical History  Diagnosis Date  . Mental retardation   . Hyperglycemia   . HTN (hypertension)   . Migraines   . Diabetes mellitus   . Anxiety   . IBS (irritable bowel syndrome)     Past Surgical History  Procedure Date  . Abdominal hysterectomy   . Foot surgery   . External ear surgery     bilateral  . Glaucoma surgery   . Colonoscopy 11/2007    hyperplastic polyps, prior hx of adenomas   . Small bowel capsule 10/2007    normal  . Esophagogastroduodenoscopy 08/2007    moderate sized hiatal hernia  . Colonoscopy 05/2010    incomplete due to poor prep, hyperplastic rectal polyp  . Esophagogastroduodenoscopy 05/2010    noncritical schatzki ring s/p 12F    Current Outpatient Prescriptions  Medication Sig Dispense Refill  . ALPRAZolam (XANAX) 0.25 MG tablet Take 0.25 mg by mouth 2 (two) times daily as needed.        . AMITIZA 24 MCG capsule TAKE 1 CAPSULE BY MOUTH  ONCE DAILY.  30 each  5  . atomoxetine (STRATTERA) 100 MG capsule Take 100 mg by mouth daily.        . calcium-vitamin D (OSCAL WITH D) 500-200 MG-UNIT per tablet Take 1 tablet by mouth 3 (three) times daily.        Marland Kitchen DEXILANT 60 MG capsule TAKE ONE CAPSULE BY MOUTHDAILY.  30 capsule  11  . furosemide (LASIX) 40 MG tablet Take 80 mg by mouth daily.        . hydrOXYzine (ATARAX) 50 MG tablet Take  50 mg by mouth daily.        Marland Kitchen lamoTRIgine (LAMICTAL) 100 MG tablet Take 100 mg by mouth daily.        Marland Kitchen lisinopril (PRINIVIL,ZESTRIL) 5 MG tablet Take 5 mg by mouth daily.        . mirtazapine (REMERON) 30 MG tablet Take 30 mg by mouth at bedtime.        . nabumetone (RELAFEN) 500 MG tablet Take 500 mg by mouth 2 (two) times daily.        . paliperidone (INVEGA) 9 MG 24 hr tablet Take 9 mg by mouth every morning.        . potassium chloride (KLOR-CON) 10 MEQ CR tablet Take 10 mEq by mouth daily.        . sertraline (ZOLOFT) 100 MG tablet Take 200 mg by mouth daily.        . traZODone (DESYREL) 50 MG tablet Take 50 mg by mouth at bedtime.          Allergies as of 11/17/2010 - Review Complete 11/17/2010  Allergen Reaction Noted  . Acetaminophen    . Aspirin    .  Aspirin-acetaminophen-caffeine    . Nsaids    . Penicillins    . Thorazine (chlorpromazine hcl)  10/20/2010   Family History:  There is no known family history of colorectal carcinoma , liver disease, or inflammatory bowel disease.  Review of Systems: Gen: Denies any fever, chills, sweats, anorexia, fatigue, weakness, malaise, weight loss, and sleep disorder CV: Denies chest pain, angina, palpitations, syncope, orthopnea, PND, peripheral edema, and claudication. Resp: Denies dyspnea at rest, dyspnea with exercise, cough, sputum, wheezing, coughing up blood, and pleurisy. GI: Denies vomiting blood, jaundice, and fecal incontinence.   Denies dysphagia or odynophagia. Derm: Denies rash, itching, dry skin, hives, moles, warts, or unhealing ulcers.  Psych: Denies depression, anxiety, memory loss, suicidal ideation, hallucinations, paranoia, and confusion. Heme: Denies bruising, bleeding, and enlarged lymph nodes.  Physical Exam: BP 132/87  Pulse 92  Temp(Src) 97.6 F (36.4 C) (Tympanic)  Ht 5' 10.5" (1.791 m)  Wt 241 lb 12.8 oz (109.68 kg)  BMI 34.20 kg/m2 General:   Alert,  Well-developed, well-nourished, pleasant and  cooperative in NAD Head:  Normocephalic and atraumatic. Eyes:  Sclera clear, no icterus.   Conjunctiva pink. Mouth:  No deformity or lesions, dentition normal. Neck:  Supple; no masses or thyromegaly. Heart:  Regular rate and rhythm; no murmurs, clicks, rubs,  or gallops. Abdomen:  Soft, nontender and nondistended. No masses, hepatosplenomegaly or hernias noted. Normal bowel sounds, without guarding, and without rebound.   Msk:  Symmetrical without gross deformities. Normal posture. Pulses:  Normal pulses noted. Extremities:  Without clubbing or edema. Neurologic:  Alert and  oriented x4;  grossly normal neurologically. Skin:  Intact without significant lesions or rashes. Cervical Nodes:  No significant cervical adenopathy. Psych:  Alert and cooperative. Normal mood and affect.

## 2010-11-17 NOTE — Assessment & Plan Note (Addendum)
Chronic constipation/IBS not responding to daily amitiza .  Pt can increase to BID if needed w/ food.  Will give bowel purge in the way of MoviPrep.  She is to hold Amitiza until prep is complete.  Keep July 2012 Follow up as planned.

## 2010-11-17 NOTE — Progress Notes (Signed)
Cc to PCP 

## 2010-11-17 NOTE — Patient Instructions (Signed)
Moviprep Bowel Prep Instructions:  Drink lots of clear liquids-like gatorade, sprite, water Empty 1 pouch A & B into disposable container Add water to the top line, mix & dissolve Every 15 minutes, drink down to the next line until complete  BE SURE TO DRINK 16 ounces of your favorite CLEAR LIQUID  Repeat a second time if needed  Hold amitiza today & tomorrow.  Start back on Saturday.  Hold if you have diarrhea.

## 2010-11-22 NOTE — Assessment & Plan Note (Signed)
NAMEMarland Kitchen  NYIA, TSAO                 CHART#:  [id number]   DATE:  09/17/2007                       DOB:  08-25-1960   CHIEF COMPLAINT:  Abdominal pain post defecation.   SUBJECTIVE:  [name] is a 50 year old female.  She has history of  chronic IBS, mostly diarrhea predominant, as well as chronic GERD.  She  has history of intermittent abdominal pain.  She has had an extensive  workup previously.  More recently, she was having refractory GERD,  nausea and vomiting.  She underwent EGD by Dr. Jena Gauss on August 15, 2007, which was normal except for a moderate-sized hiatal hernia.  She  then had a gastric emptying study, which was normal.  She had an  abdominal ultrasound on August 30, 2007, which was normal as well.  She has had a HIDA scan back in 2004.  She has also had a small bowel  capsule study on October 24, 2003, which was normal.  She has had a normal  colonoscopy as well.  She has had stool studies, which have been benign  as well.  She, at times, does have episodes of constipation.   More recently, she has been having a bowel movement every day, but notes  I hurt when I go.  She describes the pain as epigastric and mid  abdominal.  It can be 10/10 at worst.  It does last for several days at  times.  She complains of some nausea, but has not had any vomiting.  She  denies any fever or chills.  Her appetite has been good, and her weight  has remained stable.   CURRENT MEDICATION:  See the list from September 17, 2007.   ALLERGIES:  1. PENICILLIN.  2. NSAIDS.  3. ASPIRIN.  4. TYLENOL.   OBJECTIVE:  VITAL SIGNS:  Weight 222 pounds.  Height 70 inches.  Temperature 97.9.  Blood pressure 112/78.  Pulse 80.  GENERAL:  She is an obese [place] American female who is alert,  oriented, pleasant, cooperative, and in no acute distress.  HEENT:  Sclerae clear.  Conjunctivae pink.  Oropharynx pink and moist  without lesions.  CHEST:  Heart regular rate and rhythm.  Normal S1 and S2.  ABDOMEN:  Protuberant with positive bowel sounds x4.  No bruits  auscultated.  Soft, nontender, non-distended without palpable mass or  hepatosplenomegaly.  No rebound tenderness or guarding.  EXTREMITIES:  Without clubbing or edema bilaterally.   IMPRESSION:  Ms. Collett is a 50 year old female with history of chronic  gastroesophageal reflux disease as well as chronic irritable bowel  syndrome.  Only concern today is abdominal pain with defecation, which  is relieved by defecation, which I suspect is due to her irritable bowel  syndrome.   PLAN:  1. She should begin daily fiber supplementation, and we have given her      samples of Metamucil today.  2. Levsin 0.125 mg one p.o. morning and night p.r.n. abdominal pain,      number 60 and 2 refills.  3. She was warned side effect of constipation.  4. Irritable bowel syndrome literature given for her review.  5. Office visit in 6 months with Dr. Jena Gauss, or sooner if she has      worsening pain or any new signs of symptoms.  Lorenza Burton, N.P.  Electronically Signed     R. Roetta Sessions, M.D.  Electronically Signed    KJ/MEDQ  D:  09/18/2007  T:  09/18/2007  Job:  [id number]   cc:   Lorin Picket A. Gerda Diss, MD
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
## 2010-11-22 NOTE — Assessment & Plan Note (Signed)
NAME:  [name], [name]                 CHART#:  [id number]   DATE:  04/08/2008                       DOB:  1961/01/13   FOLLOWUP:  She comes in today complaining of incessant nonbloody watery  diarrhea.  We had last had contact with this lady back in May.  She  underwent a colonoscopy for rectal bleeding.  She is found to have some  diminutive polyp, which turned out to be hyperplastic.  Remainder of her  lower GI tract appeared normal aside from hemorrhoids.  She had wide  swings in bowel function, diarrhea, and constipation.  She was dropped  in the course of Anusol in small stores.  She tells me she is unaware of  any details of her current medications.  We do not have a current  medication list, however.  She tells me since she was constipated  earlier in the year, she was taking a powder that came in a large  bottle.  This started with poly and has been taking 2 or 3 scoops  twice a day and she has been having 15-20 bowel movements daily and  sometimes having nocturnal incontinence.  No bleeding.  She also has  been taking Lomotil on occasion for diarrhea, but we do not have a  complete accurate updated medication list at this time.   ALLERGIES:  She is allergic to penicillin, Tylenol, NSAIDs, and aspirin.   PHYSICAL EXAMINATION:  GENERAL:  Today, she appears to be at her  baseline, in no acute distress.  VITAL SIGNS:  Weight 217, height 5 feet 8 inches, temperature 98.6, BP  120/70, pulse 88, and weight is down 5 pounds.  HEENT:  No scleral icterus.  CHEST:  Lungs are clear to auscultation.  BREASTS:  Deferred.  ABDOMEN:  Positive bowel sounds, soft, and nontender without appreciable  mass or organomegaly.  EXTREMITIES:  No edema.   ASSESSMENT:  The patient has had several-month history of diarrhea and I  am suspicious that she has been taking polyethylene glycol in excessive  dose as previously prescribed for constipation and now far overshot her  endpoint and has diarrhea  because of this agent.   RECOMMENDATIONS:  Before doing anything else, we will drill down and  assess the medications, which she has actually taken to confirm this  observation.  If it turns out she has  been taking polyethylene glycol, we simply asked her to stop it and  continue on a fiber supplement like Benefiber 1 tablespoon daily and we  will plan to see her back in 1 month.       Jonathon Bellows, M.D.  Electronically Signed     RMR/MEDQ  D:  04/08/2008  T:  04/08/2008  Job:  [id number]   cc:   Lorin Picket A. Gerda Diss, MD

## 2010-11-22 NOTE — Op Note (Signed)
NAME:  [name], [name]                ACCOUNT NO.:  [id number]   MEDICAL RECORD NO.:  [id number]          PATIENT TYPE:  AMB   LOCATION:  DAY                           FACILITY:  [facility]   PHYSICIAN:  R. Roetta Sessions, M.D. DATE OF BIRTH:  10-28-1960   DATE OF PROCEDURE:  DATE OF DISCHARGE:                               OPERATIVE REPORT   INDICATIONS FOR PROCEDURE:  A 50 year old lady with chronic  gastrointestinal problems including reflux, irritable bowel syndrome, a  six-month history of nausea and vomiting.  Really no abdominal pain.  EGD is now being done __________  discussed with the patient the  potential risks, benefits, and alternatives have been reviewed.  Questions answered.  Please see the documentation in the medical record.   PROCEDURE NOTE:  O2 saturation, blood pressure, pulse and respirations  were monitored throughout the entire procedure.  Conscious sedation with  Versed 2 mg IV, Demerol 50 mg, Cetacaine spray for topical oropharyngeal  anesthesia.   FINDINGS:  Examination of tubular esophagus revealed no mucosal  abnormalities.  The patient heaved repeatedly during the procedure.   Stomach:  The gastric cavity was emptied and insufflated well with air.  Thorough examination of the gastric mucosa, including retroflexion of  the proximal stomach, esophagogastric junction, demonstrated a moderate-  sized hiatal hernia, otherwise, gastric mucosa appeared normal, pylorus  was patent and easily traversed.  Examination of the bulb and second  portion revealed no abnormalities.   THERAPY/DIAGNOSTIC MANEUVERS PERFORMED:  None.   The patient tolerated the procedure well, was reactive at endoscopy.   IMPRESSION:  Normal esophagus, moderate-sized hiatal hernia, otherwise  normal stomach, D1 and D2.   We will proceed with a __________  gastric-emptying study to evaluate  gastric emptying and further evaluate her nausea and vomiting.  Further  recommendations to  follow.      Jonathon Bellows, M.D.  Electronically Signed    RMR/MEDQ  D:  08/15/2007  T:  08/16/2007  Job:  [id number]   cc:   Lorin Picket A. Gerda Diss, MD  Fax: [phone]

## 2010-11-22 NOTE — Assessment & Plan Note (Signed)
NAME:  [name], [name]                 CHART#:  [id number]   DATE:  04/25/2007                       DOB:  19-May-1961   CHIEF COMPLAINT:  Constipation.   SUBJECTIVE:  [name] is a 50 year old African-American female who has  been followed by [place] for IBS. She has a history of diarrhea, abdominal  cramping alternating with constipation. She was last seen in January  2007. Her main concern today is constipation and proctalgia. She tells  me that she has severe pain with defecation. She describes some low  abdominal cramping. She is having a bowel movement every day, but she is  not satisfied with her bowel movement. She feels as though that she is  doing a lot of straining and they are hard. She tells me that she has  taken some medicine for this, but she cannot recall the name. She  noticed some intermittent hematochezia which was small volume with  wiping a couple of days ago.   PAST MEDICAL HISTORY:  Significant for hypertension, GERD, diabetes  mellitus, anxiety, chronic back and knee pain, IBS, and bilateral ear  surgeries, as well as complete hysterectomy, bilateral eye surgery, and  foot surgery. She had a colonoscopy by Dr. Jena Gauss on 05/05/2002. She was  found to have diminutive polyps in the rectum and left colon which were  hyperplastic and removed. She had scattered left sided diverticula. She  also had a Given's capsule study on 11/23/2003 for hemoccult positive  stool which was normal with rapid transit. She had previously had an EGD  by Dr. Jena Gauss as well.   CURRENT MEDICATIONS:  See the list from the 04/25/2007.   ALLERGIES:  PENICILLIN, ASPIRIN, NSAIDs, and TYLENOL.   FAMILY HISTORY:  No known family history of colon carcinoma or other GI  problems.   SOCIAL HISTORY:  [name] is divorced. She lives alone. She is  disabled. She smokes about two and a half packs of cigarettes per day.  She denies any alcohol or drug use.   REVIEW OF SYSTEMS:  See HPI, otherwise  negative.   PHYSICAL EXAMINATION:  VITAL SIGNS:  Weight 231 pounds, height 70  inches, temperature 98.4, blood pressure 130/82, pulse 80.  GENERAL:  [name] is an obese African-American female who is alert,  oriented, pleasant, and cooperative in no acute distress.  HEENT:  Sclerae pink, non-icteric, conjunctivae pink, oropharynx pink  and moist without any lesions.  NECK:  Supple without mass or thyromegaly.  HEART:  Regular rate and rhythm with normal S1 and S2.  No murmurs,  clicks, rubs or gallops.  ABDOMEN:  Protuberant with positive bowel sounds x4. No bruits  auscultated. Soft and nontender, nondistended without palpable mass or  hepatosplenomegaly. No rebound tenderness or guarding.  EXTREMITIES:  Without clubbing or edema bilaterally.  RECTAL:  No external lesions visualized. Good sphincter tone. No  internal masses palpated. Small amount of light brown stool in the  vault, which is hemoccult negative.   ASSESSMENT:  [name] is a 50 year old female with a history of IBS.  She is having significant constipation and straining at this time, along  with some intermittent hematochezia, which I suspect is due to a benign  anorectal source, such as internal hemorrhoid or fissure given her  proctalgia.   PLAN:  1. GlycoLax 17 grams  daily for constipation, number 527 grams with 1      refill.  2. Anusol HC suppositories, 1 per rectum b.i.d. for 10 days with no      refills.  3. I would like for her to be seen with Dr. Jena Gauss in 6 weeks or sooner      if needed.  4. She is to begin Benefiber or fiber supplement of choice and I have      given her samples to take daily.       Lorenza Burton, N.P.  Electronically Signed     R. Roetta Sessions, M.D.  Electronically Signed    KJ/MEDQ  D:  04/26/2007  T:  04/27/2007  Job:  [id number]   cc:   Lorin Picket A. Gerda Diss, MD

## 2010-11-22 NOTE — Op Note (Signed)
NAME:  [name], [name]                ACCOUNT NO.:  [id number]   MEDICAL RECORD NO.:  [id number]          PATIENT TYPE:  AMB   LOCATION:  DAY                           FACILITY:  [facility]   PHYSICIAN:  R. Roetta Sessions, M.D. DATE OF BIRTH:  1960-07-22   DATE OF PROCEDURE:  11/08/2007  DATE OF DISCHARGE:                               OPERATIVE REPORT   Colonoscopy with biopsy.   INDICATIONS FOR PROCEDURE:  A 49 year old lady with intermittent  hematochezia, 2-week history of bright red blood per rectum, distant  history of adenomatous polyp, and is due for surveillance.  Colonoscopy  is now being done.  She [facility] with  reports that she still had some formed dark stool coming out even though  she took her prep.  We gave her 2 more liters of GoLYTELY, and then  performing the exam, we gave her 2 more enemas as well.  Risks,  benefits, and alternatives have been reviewed and questions were  answered.  Please see the documentation in the medical record.   PROCEDURE NOTE:  O2 saturation, blood pressure, pulse, and respirations  were monitored throughout the entire procedure.   CONSCIOUS SEDATION:  Versed 3 mg IV, and Demerol 50 mg IV in divided  doses.   INSTRUMENT:  Pentax video chip system.   FINDINGS:  A digital rectal exam revealed no abnormalities.  Endoscopic  findings:  The prep was good.  Colon:  Colonic mucosa was surveyed from  the rectosigmoid junction through the left, transverse,  and right colon  to the appendiceal orifice, ileocecal valve, and cecum.  These  structures were well seen and photographed for the record.  From this  level, scope was slowly withdrawn.  All previously mentioned mucosal  surfaces were again seen.  The patient had two diminutive polyps in the  mid descending colon, which were cold biopsied/removed.  The remainder  of colonic mucosa appeared normal.  The scope was pulled down to the  rectum where thorough  examination of rectal mucosa including retroflexed  view of the anal verge demonstrated some distal 2-3 mm polyps just  inside the anal verge.  They were also cold biopsied/removed.  The  patient also had some internal hemorrhoids.  The patient tolerated the  procedure well and was reactive in Endoscopy.   IMPRESSION:  1. Distal rectal polyps diminutive, status post cold biopsy removal,      internal hemorrhoids, otherwise normal rectum.  2. Diminutive descending colon polyp status post cold biopsy.      Remainder of colonic mucosa appeared normal.   RECOMMENDATIONS:  1. Hemorrhoid literature provided to Ms. Impastato, follow up with path.  2. A 10-day course of Anusol-HC suppository one per rectum at bedtime.      Jonathon Bellows, M.D.  Electronically Signed     RMR/MEDQ  D:  11/08/2007  T:  11/09/2007  Job:  [id number]   cc:   Lorin Picket A. Gerda Diss, MD  Fax: [phone]

## 2010-11-22 NOTE — Assessment & Plan Note (Signed)
NAMEMarland Kitchen  [name], [name]                 CHART#:  [id number]   DATE:  05/08/2008                       DOB:  05-04-1961   CHIEF COMPLAINT:  Diarrhea followup.   PROBLEM LIST:  1. Chronic constipation alternating with diarrhea, suspected irritable      bowel syndrome versus diabetic diarrhea.  2. History of adenomatous polyps.  Last colonoscopy on Nov 08, 2007 by      Dr. Jena Gauss.  3. Chronic gastroesophageal reflux disease.  EGD by Dr. Jena Gauss on      August 15, 2007.  4. Normal GES on August 20, 2007.  5. Normal small bowel capsule on endoscopy on October 24, 2003.  6. Hemorrhoids.  7. Mentally challenged.  8. Migraine headaches.  9. Hypertension.  10.Diabetes mellitus, poorly controlled.  11.Anxiety.  12.Chronic back and knee pain.  13.Bilateral ear surgery.  14.Complete hysterectomy.  15.Foot surgery.  16.Obesity.   SUBJECTIVE:  The patient is a 50 year old African American female.  She  presents today with complaints of fatigue.  She occasionally has  intermittent cramping abdominal pain, but tells me it is relieved with  defecation.  She is having some lower extremity edema.  She has gained  11-1/2 pounds since she was last seen  1 month ago.  She is taking  Benefiber daily.  She has discontinued her MiraLax.  She tells me she  continues to have loose stools throughout the day.  She is eating lots  of greasy fried food at [facility] and [employer].  She is unable to cook for  herself.  She tells me she does not have any pain.  She is also having  left leg pain that starts at her buttock and shoots down her left leg.  Her heartburn and indigestion is well controlled at this time.   CURRENT MEDICATIONS:  See the list from May 08, 2008.   ALLERGIES:  Penicillin, NSAIDs, aspirin, and Tylenol.   OBJECTIVE:  VITAL SIGNS:  Weight 228.5 pounds, height 70 inches,  temperature 97.9, blood pressure 120/78, and pulse 60.  GENERAL:  She is an obese [place] American female, who is alert,  oriented, pleasant, and cooperative.  HEENT:  Sclerae clear and nonicteric.  Conjunctivae pink.  Oropharynx  pink and moist without any lesions.  CHEST:  Heart, regular rhythm.  Normal S1 and S2.  No murmurs, clicks,  rubs, or gallops.  ABDOMEN:  Protuberant with positive bowel sounds x4.  No bruits  auscultated.  Soft, nontender, nondistended without palpable mass or  hepatosplenomegaly.  No rebound, tenderness, or guarding.  EXTREMITIES:  She has 1+ lower pretibial edema bilaterally.   ASSESSMENT:  The patient is a 50 year old African American female.  She  has irritable bowel syndrome and is doing well at this point.  She is  having some diarrhea.  She also has chronic gastroesophageal reflux  disease well controlled proton pump inhibitors.   As far as her left leg pain is concerned, I have asked her to follow up  with Dr. Gerda Diss.  Also, she tells me her diabetes has been poorly  controlled for which she will need to follow up with Dr. Gerda Diss as well.   PLAN:  1. Continue Benefiber.  2. Continue omeprazole 20 mg daily, #31 with 11 refills.  3. If she has any further problems, she is going  to call us, otherwise      we will follow up in 1 year.  She is going to need repeat      colonoscopy with Dr. Jena Gauss in May 2014.       Lorenza Burton, N.P.  Electronically Signed     R. Roetta Sessions, M.D.  Electronically Signed    KJ/MEDQ  D:  05/08/2008  T:  05/09/2008  Job:  [id number]   cc:   Lorin Picket A. Gerda Diss, MD

## 2010-11-22 NOTE — H&P (Signed)
[name], [name]                ACCOUNT NO.:  [id number]   MEDICAL RECORD NO.:  [id number]          PATIENT TYPE:  INP   LOCATION:  2901                         FACILITY:  [facility]   PHYSICIAN:  Unice Cobble, MD     DATE OF BIRTH:  Dec 31, 1960   DATE OF ADMISSION:  05/22/2008  DATE OF DISCHARGE:                              HISTORY & PHYSICAL   CHIEF COMPLAINT:  Chest pain.   HISTORY OF PRESENT ILLNESS:  This is a 50 year old African American  female with a history of hypertension, diabetes type 2, and psychiatric  issues with recent discharge from the hospital for suicidal ideation who  presents with chest pain.  The pain onset was insidious starting 4 days  ago.  It has now been at a level of 10 out of 10 for 4 days straight.  She tells me that nothing makes it better and nothing makes it worse,  but inspiration and palpation of the chest tend to bring out more pain.  The pain radiates to both shoulders and through to her back.  She has  had some nausea and vomiting, but this is a chronic problem for her.  She complains of increased swelling in her legs, but no specific  shortness of breath.  No orthopnea or PND.   PAST MEDICAL HISTORY:  1. Depressive disorder with history of suicidal ideation.  2. History of mental retardation.  3. Diabetes mellitus type 2.  4. Hypertension.  5. Constipation versus irritable bowel syndrome.  6. Chronic nausea and vomiting.  7. GERD.  8. Obstructive sleep apnea pending throat surgery.  9. Pending eye surgery for unclear reasons.  10.Chronic back pain.  11.Migraines.  12.Chronic knee pain.  13.Status post hysterectomy.  14.Status post abdominal surgery/kidney surgery as a child.   ALLERGIES:  1. ASPIRIN.  2. NONSTEROIDAL ANTI-INFLAMMATORIES.  3. PENICILLIN.  4. TYLENOL.   MEDICATIONS:  1. Trazodone 50 mg q.h.s.  2. Metanx daily.  3. KCl 10 mEq daily.  4. Alprazolam 0.25 mg b.i.d.  5. Metformin 500 mg b.i.d.  6. Lisinopril 5 mg  daily.  7. Lasix 40 mg daily.  8. Docusate 200 mg daily.  9. Pantoprazole 40 mg daily.  10.Bentyl 10 mg t.i.d.  11.Zoloft 50 mg daily.   SOCIAL HISTORY:  She lives in [place] alone.  She is disabled.  She  smokes 2 packs per day.  No alcohol or drugs.   FAMILY HISTORY:  Her mother died at the age of 18 from coronary disease.  Her father was a diabetic.   REVIEW OF SYSTEMS:  A 12-point review of systems was done and she was  found to have peripheral neuropathy which bothers her from time to time,  chronic nausea, vomiting and constipation.  All other systems were  reviewed and found to be negative except as stated in the HPI.   PHYSICAL EXAMINATION:  VITAL SIGNS:  She is afebrile with a pulse of 68,  respiratory rate is 11 with blood pressure of 140/72, her O2 sats are  100% on 2 liters.  GENERAL:  She is obese and  grimacing in pain.  HEENT:  Shows PERRLA, EOMI.  Oropharynx without erythema or exudates.  Dentition is good.  NECK:  Supple without lymphadenopathy, thyromegaly, bruits or jugular  venous distention.  HEART:  Regular rate and rhythm with a normal S1-S2.  She has no  murmurs, gallops or rubs.  Normal PMI.  Pulses are 2+ and equal  bilaterally without bruits.  CHEST:  Tender all over the sternum and up into the shoulders  bilaterally reproducing the pain that she complains of.  LUNGS:  Clear to auscultation bilaterally without wheezes, rhonchi or  rales.  SKIN:  Shows no rashes or lesions.  ABDOMEN:  Soft with normal bowel sounds.  No rebound or guarding.  She  is tender diffusely and specifically in the epigastrium.  EXTREMITIES:  Show no cyanosis, clubbing or edema.  She tells me her  legs are tender to the touch due to fluid.  MUSCULOSKELETAL:  Shows no joint deformity, effusions, spine or CVA  tenderness.  NEUROLOGICALLY:  She is alert and oriented x3.  Cranial nerves are  grossly intact.  Strength is 5/5 in all extremities and axial groups.  She tells me she  cannot feel her legs very well.   DIAGNOSTICS:  1. Her chest x-ray was read as no acute abnormality.  2. EKG is 69 beats per minute in normal sinus rhythm.  She has      nonspecific changes to the T-wave.   LABORATORY DATA:  Labs show a normal white count and a normal  hemoglobin.  Her potassium is a little low at 2.9, but otherwise her  creatinine is 0.6 and her glucose is 99.  D-dimer is negative at 0.37.  Her CK-MB and troponin are negative as well.   ASSESSMENT/PLAN:  This is a 50 year old African American female with a  history of hypertension and diabetes with an extensive psychiatric  history who presents with chest pain.  Due to the reproducibility of the  pain, its pleuritic component, normal ECG and normal enzymes, this chest  pain is not cardiac.  I will check an ESR to rule out pericarditis just  in case even though the EKG does not support this.  She does have some  epigastric pain and I will check a lipase to ensure pancreatitis is not  in the differential.  There is not a large component of shortness of  breath for this and her D-dimer is negative making pulmonary embolism  very unlikely.  If an inflammatory process  is suspected, she may benefit from steroids at some point in the future,  although I suspect a large psychiatric component. Tonight, I will focus  on pain relief.  If everything is normal tomorrow, we will consider a  stress test and discharge in the morning.      Unice Cobble, MD  Electronically Signed     ACJ/MEDQ  D:  05/22/2008  T:  05/23/2008  Job:  [id number]
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
## 2010-11-22 NOTE — H&P (Signed)
NAME:  [name], [name]                ACCOUNT NO.:  [id number]   MEDICAL RECORD NO.:  [id number]          PATIENT TYPE:  AMB   LOCATION:  DAY                           FACILITY:  [facility]   PHYSICIAN:  R. Roetta Sessions, M.D. DATE OF BIRTH:  09-07-1960   DATE OF ADMISSION:  DATE OF DISCHARGE:  LH                              HISTORY & PHYSICAL   PRIMARY CARE PHYSICIAN:  Dr. [name].   CHIEF COMPLAINT:  Chronic vomiting.   HISTORY OF PRESENT ILLNESS:  [name] is a 50 year old female.  She has  been seen by Dr. Jena Gauss for quite some time with a history of chronic  intermittent nausea and vomiting as well as chronic constipation.  She  states that over the last 6 months she has been having daily nausea and  vomiting.  She cannot keep down her food.  She has episodes  intermittently throughout the day.  There is no particular time of her  nausea and vomiting.  She does feel as though food gets stuck in the  back of her throat.  She denies any odynophagia.  She complains of mid  abdominal pain which is intermittent as well as pain that radiates up  her chest.  She has lost 8 pounds in the past 3 months.  She recently  has noted some loose stools.  She previously has a history of chronic  constipation.  She describes an episode of recent incontinence.  She has  seen some blood in her stool with that episode of incontinence.  She  describes it as dark blood.  She is taking naproxen t.i.d.  She denies  any other NSAID use.  She is on omeprazole 20 mg daily.  She has daily  heartburn and indigestion.   PAST MEDICAL AND SURGICAL HISTORY:  She is mentally challenged.  She has  a history of migraine headaches.  She has a history of chronic  constipation and irritable bowel syndrome.  She has had a colonoscopy by  Dr. Jena Gauss on May 05, 2002.  She was found to have diminutive polyps  in the rectum and left colon which were hyperplastic and removed.  She  had scattered left-sided diverticula.   She had a capsule study on Nov 23, 2003 for Hemoccult positive stool which was normal with rapid  transit.  She had an EGD by Dr. Jena Gauss on February 13, 2002.  She was found  to have posterior bulbar erosions, otherwise normal examination.  She  has a history of hypertension, diabetes mellitus, anxiety, chronic back  and knee pain, bilateral ear surgery, complete hysterectomy, foot  surgery.   CURRENT MEDICATIONS:  1. Xanax 0.25 mg b.i.d. p.r.n.  2. Docusate calcium 24 mg daily.  3. Fluoxetine 20 mg nightly.  4. Lasix 40 mg daily.  5. Hyoscyamine 0.375 mg b.i.d.  6. Lisinopril 25 mg daily.  7. Loperamide 2 mg daily p.r.n.  8. Metformin 500 mg b.i.d.  9. Glyburide 3 mg daily.  10.Omeprazole 20 mg daily.  11.Potassium chloride 10 mEq t.i.d.  12.Trazodone 50 mg daily.  13.Zyprexa 5 mg nightly.  14.Dicyclomine 10 mg t.i.d. p.r.n.  15.Diphenoxylate 2.5/0.025 2 p.o. q.i.d. p.r.n.  16.Guaifenex b.i.d. p.r.n.  17.Ibuprofen 600 mg p.r.n.  18.Naproxen 500 mg p.r.n.  19.Phenergan 25 mg every 4-6 hours.  20.Darvocet-N 100 every 8 hours p.r.n.  21.Zoloft unknown dose b.i.d.   ALLERGIES:  PENICILLIN, ASPIRIN, TYLENOL, AND SOME NSAIDS.   FAMILY HISTORY:  There is no known family history of carcinoma or  chronic gastrointestinal problems.   SOCIAL HISTORY:  Ms. Cornell is divorced and lives alone.  She is  disabled.  She smokes about 2.5 half packs of cigarettes per day.  Denies any alcohol or drug use.   REVIEW OF SYSTEMS:  See history of present illness.  GYNECOLOGICAL:  She  is status post hysterectomy.  She denies any vaginal discharge.  She she  denies any dysuria, hematuria, or increased urinary frequency.   PHYSICAL EXAMINATION:  VITAL SIGNS:  Weight 223 pounds, height 70  inches, temperature 98 degrees, blood pressure 102/60, and pulse 88.  GENERAL: She is an obese, alert, oriented, pleasant, and cooperative  [place] American female in no acute distress.  HEENT:  Sclerae  nonicteric.  Conjunctivae pink.  Oropharynx pink and  moist without lesions.  NECK:  Supple without thyromegaly.  CHEST:  Heart regular rate and rhythm.  Normal S1 and S2.  No murmurs,  clicks, rubs, or gallops.  LUNGS:  Clear to auscultation bilaterally.  ABDOMEN:  Protuberant with positive bowel sounds x4.  No bruits  auscultated.  Soft and nontender without palpable mass or  hepatosplenomegaly.  No rebound or guarding.  EXTREMITIES:  Without  clubbing or edema bilaterally.  SKIN:  Brown, warm, and dry without rash or jaundice.  EXTREMITIES:  Without clubbing or edema bilaterally.   LABORATORY STUDIES:  From her emergency room visit on July 18, 2007  showed a hemoglobin of 12.1, hematocrit 36, white blood cell count of  5.1, platelet count 173,000.  She had a potassium at 3.3, sodium 137,  chloride 106, CO2 27, glucose 101, BUN 2, creatinine 0.61, and calcium  8.7.   IMPRESSION:  Ms. Bare is a 50 year old female with a history of chronic  gastrointestinal problems including chronic gastroesophageal reflux  disease and irritable bowel syndrome.  More recently. she has had a 6  month history of chronic nausea and vomiting and an 8 pound weight loss  in the last 3 months.  She is going to require further evaluation to  rule out gastritis and peptic ulcer disease given her symptoms.  She  also has refractory current symptoms and she alternates between  constipation and diarrhea with irritable bowel syndrome.   PLAN:  1. EGD with Dr. Jena Gauss in the near future.  I discussed the procedure      and the risks and benefits including bleeding, infection,      perforation, and drug reaction.  She understood and informed      consent has been obtained.  2. She is to continue omeprazole 20 mg daily for now.  3. She can use either hyoscyamine or dicyclomine for her bouts of      abdominal pain and diarrhea, but she is not to use both.      Lorenza Burton, N.P.      Jonathon Bellows,  M.D.  Electronically Signed    KJ/MEDQ  D:  08/08/2007  T:  08/09/2007  Job:  [id number]   cc:   Lorin Picket A. Gerda Diss, MD  Fax: [phone]

## 2010-11-22 NOTE — Consult Note (Signed)
[name], [name]                ACCOUNT NO.:  [id number]   MEDICAL RECORD NO.:  [id number]          PATIENT TYPE:  AMB   LOCATION:  DAY                           FACILITY:  [facility]   PHYSICIAN:  Kassie Mends, M.D.      DATE OF BIRTH:  [birth year]   DATE OF CONSULTATION:  10/28/2007  DATE OF DISCHARGE:                                 CONSULTATION   CHIEF COMPLAINT:  Rectal bleeding.   HISTORY OF PRESENT ILLNESS:  [name] is a 50 year old female with  history of intermittent small to moderate-volume hematochezia.  Approximately 2 weeks ago she developed dark burgundy blood in small to  moderate amounts with wiping on the toilet paper as well as in the  commode post defecation.  She complains of history of hard stools and  straining.  She does have history of hemorrhoids as well as history of  adenomatous polyps.  Last colonoscopy was May 06, 2002.  She was  found to have diminutive hyperplastic polyps in the rectum and left  colon as well as a few left-sided diverticula.  On previous colonoscopy,  however, on February 13, 2002, she was found to have diminutive polyps in  the rectum which were snared and destroyed as well as a polyp at 45 cm,  which was 5 mm on a stalk.  It was found to be a tubular adenoma.  She  is due for surveillance colonoscopy at this time.  She has history of  chronic nausea and vomiting as well as GERD.  She did undergo EGD by Dr.  Jena Gauss August 15, 2007, given her significant nausea and vomiting.  It  was felt that she was unable to tolerate the prep for the colonoscopy  and therefore it was postponed until now.  She had a normal GES August 20, 2007.  She had a normal abdominal ultrasound on August 30, 2007.  She also has a history of IBS.  She had a small-bowel capsule endoscopy  October 24, 2003, which was normal.  She does complain of severe  proctalgia.  She has undergone hemorrhoid treatment multiple times in  the last couple of years with continued  intermittent hematochezia.  She  is not having abdominal pain today.  She has intermittent nausea but  denies any recent vomiting.  Her appetite is good.  Her weight has been  relatively stable.   PAST MEDICAL-SURGICAL HISTORY:  1. She is mentally challenged.  2. She has history of migraine headaches.  3. Chronic constipation.  4. IBS, see HPI.  5. History of adenomatous polyps as described in HPI as well as two      hyperplastic polyps on last colonoscopy.  6. She has a history of left-sided diverticula.  7. She had a normal capsule endoscopy which showed rapid transit Nov 23, 2003.  8. EGD as described in HPI.  9. Hypertension.  10.Diabetes mellitus.  11.Anxiety.  12.Chronic back and knee pain.  13.Bilateral ear surgery.  14.Complete hysterectomy.  15.Foot surgery.   CURRENT MEDICATIONS:  1. Xanax 0.25 mg b.i.d.  2. Docusate.  3. Calcium 2400 mg daily.  4. Fluoxetine 20 mg nightly.  5. Lasix 40 mg daily.  6. Hyoscyamine 0.375 mg b.i.d.  7. Lisinopril 5 mg daily.  8. Loperamide 2 mg daily.  9. Metformin 500 mg b.i.d.  10.Glyburide 3 mg daily.  11.Omeprazole 20 mg daily.  12.Potassium chloride 10 mEq b.i.d.  13.Trazodone 50 mg daily.  14.Zyprexa 5 mg nightly.  15.Dicyclomine 10 mg t.i.d. p.r.n.  16.Diphenoxylate 2.5/0.025 two q.i.d. p.r.n.  17.Guaifenex PSC b.i.d. p.r.n.  18.Naproxen 500 mg b.i.d. p.r.n.  19.Phenergan 25 mg q.4-6 h. p.r.n.  20.Darvocet-N 100 one two q.8 h. p.r.n.  21.Zoloft unknown dose.  22.A new migraine prescription, she does not know the name.   ALLERGIES:  PENICILLIN, NSAIDs, ASPIRIN, TYLENOL and IBUPROFEN.   FAMILY HISTORY:  There is no known family history of colorectal  carcinoma, liver or chronic GI problems.   SOCIAL HISTORY:  [name] is divorced and lives alone.  She is  disabled.  She does have a history of tobacco use, smoking about 2-1/2  packs per day.  Denies any alcohol or drug use.   REVIEW OF SYSTEMS:  See HPI,  otherwise negative.   PHYSICAL EXAMINATION:  VITAL SIGNS:  Weight 220 pounds, height 70  inches, temperature 98.1, blood pressure 124/88, pulse 64.  GENERAL:  She is an obese, alert, oriented, pleasant, cooperative  African female in no acute distress.  HEENT:  Sclerae are clear, nonicteric.  Conjunctivae pink.  Oropharynx  pink and moist without any lesions.  NECK: Supple without any mass or thyromegaly.  CHEST:  Heart regular rate and rhythm, normal S1 and S2, without  murmurs, rubs or gallops.  Lungs are clear to auscultation bilaterally.  ABDOMEN:  Protuberant with positive bowel sounds x4.  No bruits  auscultated.  Soft, nontender, nondistended.  No palpable mass or  hepatosplenomegaly, no rebound tenderness or guarding.  EXTREMITIES:  Without clubbing or edema bilaterally.  SKIN:  Warm and dry without any rash or jaundice.  EXTREMITIES:  Without clubbing or edema.   IMPRESSION:  [name] is a 50 year old female with a history of  intermittent hematochezia with a 2-week history of symptoms noted,  noticing dark to bright red blood with wiping on the toilet paper and in  the commode.  She has longstanding history of irritable bowel syndrome  with chronic constipation and hemorrhoidal bleeding.  She also has  history of adenomatous polyp and is due for repeat surveillance as well.  She is on nonsteroidal anti-inflammatory drugs, putting her at risk for  nonsteroidal anti-inflammatory drug-induced gastrointestinal tract  injury.  Etiology of her bleeding includes benign anorectal source such  as internal hemorrhoids, fissure, diverticular bleeding, nonsteroidal  anti-inflammatory drug insult, or, less likely, a small-bowel etiology.   PLAN:  1. Anusol-HC suppositories one per rectum b.i.d., #20 with no refills.  2. Colonoscopy with Dr. Jena Gauss as soon as possible.  I discussed this      procedure including the risks and benefits which include but are      not limited to infection,  perforation, drug reaction.  She agrees.      A signed consent will be obtained.  3. She is to take half her metformin and glyburide the day prior to      and of the procedures.  4. Further recommendations to follow.  5. Continue omeprazole 20 mg daily.  6. May need to consider adjustment and further education regarding      loperamide  and dicyclomine given her recent bouts of constipation      and history of IBS.   Dr. Cira Servant will be signing this note in Dr. Luvenia Starch absence.      Lorenza Burton, N.P.      Kassie Mends, M.D.  Electronically Signed    KJ/MEDQ  D:  10/29/2007  T:  10/29/2007  Job:  [id number]

## 2010-11-22 NOTE — Procedures (Signed)
[name], [name]                ACCOUNT NO.:  [id number]   MEDICAL RECORD NO.:  [id number]          PATIENT TYPE:  OUT   LOCATION:  SLEE                          FACILITY:  [facility]   PHYSICIAN:  Kofi A. Gerilyn Pilgrim, M.D. DATE OF BIRTH:  Jun 10, 1961   DATE OF PROCEDURE:  02/19/2008  DATE OF DISCHARGE:  02/19/2008                             SLEEP DISORDER REPORT   REFERRING PHYSICIAN:  Scott A. Gerda Diss, MD   RECORDING DATE:  February 19, 2008.   SLEEP STAGE SUMMARY:  This is a split-night study with the first part  being a diagnostic and the second portion a titration portion.  The  total recording time is 423 minutes, sleep efficiency 95%, sleep latency  0.5 minutes, and REM latency 5.5 minutes.  Respiratory; the first  portion is a diagnostic portion and the second part is a titration  portion.  The baseline oxygen saturation is 96%.  The lowest saturation  is 87%.  The AHI during the diagnostic portion is 19.5.  The patient was  titrated between pressures of 5 and 10 with an optimal pressure of 10  results and the resolution of obstructive events.   LEM MOVEMENT SUMMARY:  PLM index 0.   ELECTROCARDIOGRAM SUMMARY:  No dysrhythmia was observed.  Average heart  rate 71.   IMPRESSION:  Moderate obstructive sleep apnea syndrome, which responded  well to a CPAP pressure of 10.      Kofi A. Gerilyn Pilgrim, M.D.  Electronically Signed     KAD/MEDQ  D:  02/26/2008  T:  02/26/2008  Job:  [id number]

## 2010-11-25 NOTE — Op Note (Signed)
[facility]  Patient:    [name], [name] Visit Number: [id number] MRN: [id number]          Service Type: SUR Location: 4A A420 01 Attending Physician:  Tilda Burrow Dictated by:   Christin Bach, M.D. Proc. Date: 08/08/01 Admit Date:  08/08/2001 Discharge Date: 08/12/2001   CC:         [name], M.D.   Operative Report  PREOPERATIVE DIAGNOSES:  Right ovarian cyst.  POSTOPERATIVE DIAGNOSES:  Right ovarian cyst, right hydrosalpinx, left hydrosalpinx, pedunculated small uterine fibroid.  PROCEDURE:  Right salpingo-oophorectomy, left salpingectomy, removal of pedunculated fibroid.  SURGEON:  Dr. Christin Bach  ASSISTANT:  Earlene Plater, RN  ANESTHESIA:  General.  COMPLICATIONS:  None.  ESTIMATED BLOOD LOSS:  150 cc.  OUTPUT:  50 cc.  INTAKE:  2450 cc Crystalloid.  PROCEDURE:  Bilateral chronic hydrosalpinx, right greater than left, simple right ovarian cyst densely adherent to side wall and back of broad ligament, small pedunculated uterine fibroid interfering with visualization.  DETAILS OF PROCEDURE:  Patient was taken to the operating room, prepped and draped for vertical lower abdominal surgery.  Midline incision was performed to the fascia which was opened easily and the bowel identified and packed away.  The patient had a great deal of intra-abdominal contents even though the bowel had been prepped.  The combination of fat and abdominal cavity contents made it difficult to pack things away and required two moistened laparotomy tapes plus a green towel used to pack the bowel away.  Balfour retractor was in place.  The pelvis was inspected and in addition to a pedunculated fibroid the right adnexa was made up of a thickened, sclerotic right tube which had a hydrosalpinx resting over a simple cyst in the right ovary.  We proceeded with salpingectomy by dissecting tight filmy adhesions in the right lower quadrant of the pelvis.  The  patient had the retroperitoneum opened on the right side and the infundibulopelvic ligament isolated and skeletonized.  The ureter could be identified in its retroperitoneal space and was confirmed as being well out of harms way.  The right IP ligament was taken down, clamped, cut, and doubly ligated using 0 chronic suture ligatures. The ovary was mobilized off of the side wall and then the right utero-ovarian ligament and fallopian tube stump grasped with a Kelly clamp x2, transected with the right adnexa excised, and then the pedicle ligated for hemostasis.  Attention was then directed to the opposite side which was inspected further. A small 2 cm pedunculated fibroid was in the way of visualizing the cul-de-sac and was removed from the posterior aspects of the uterus by cross clamping its pedicle, excising the ovary, and doubly ligating the pedicle.  The left adnexa was then inspected.  The ovary appeared grossly normal, though it was adherent to the side wall.  The overlying surface had a hydrosalpinx approximately 1 cm in diameter at longest length.  This could be removed by cross clamping at the insertion of the tube into the uterus, clamping, cutting, and suture ligating the tube.  The broad ligament was then serially elevated, clamped with Kelly clamp with gradual shelling of the fallopian tube on the right side all the way down to its fimbria where the fimbria were densely adherent to the ovary. We opened the tube in order to maintain orientation and cross clamped across the fimbrial area, excising the tube and ligating the pedicle.  Hemostasis was adequate and irrigation of the pelvis then  performed.  Laparotomy equipment was removed.  The tight amount of fat in the abdomen made the entire procedure technically challenging despite packing away.  At the end of the procedure we considered her to be adequately hemostatic with relatively minimal blood loss. There had been some  additional sutures necessary on the patients right side at the area of the right utero-ovarian ligament necessary to achieve hemostasis.  The anterior peritoneum was closed using 2-0 chromic (some consideration was given to proceeding with hysterectomy but the patient had been emphatic about this in the preoperative counseling, so given the relatively normal nature of the structures, we honored her request that the uterus and the uninvolved ovary be left in place, though the potential for recurrence of pain and cyst is present).  After closure of the anterior peritoneum, the fascia was closed using running 0 Vicryl.  Subcutaneous fatty tissues irrigated, confirmed as hemostatic, and closed with interrupted 2-0 plain.  Two retention sutures were placed in a vertical matris fashion beneath the skin and then staple closure of the skin completed the procedure.  Patient tolerated procedure well.  Went to recovery room in good condition. Dictated by:   Christin Bach, M.D. Attending Physician:  Tilda Burrow DD:  08/11/01 TD:  08/12/01 Job: [id number]

## 2010-11-25 NOTE — Op Note (Signed)
NAME:  [name], [name]                          ACCOUNT NO.:  [id number]   MEDICAL RECORD NO.:  [id number]                   PATIENT TYPE:  AMB   LOCATION:  DAY                                  FACILITY:  [facility]   PHYSICIAN:  Gerrit Friends. Rourk, M.D.               DATE OF BIRTH:  [birth year]   DATE OF PROCEDURE:  DATE OF DISCHARGE:                                 OPERATIVE REPORT   PROCEDURE PERFORMED:  Diagnostic EGD, followed by colonoscopy and snare  polypectomy.   INDICATIONS:  The patient is a 50 year old, mentally-challenged lady with a  generalized abdominal pain recently with intermittent nausea and vomiting  reported. A gallbladder ultrasound was negative recently. She has had some  mild rectal bleeding. EGD and colonoscopy are now being done to further  evaluate her symptoms. The approach has been discussed with the patient and  her caregiver previously in the office. The potential risks, benefits and  alternatives have been reviewed and questions answered, and she is  agreeable. Please see my dictated history and physical for more information  on the patient.   ANESTHESIA:  Conscious sedation with IV Versed and Demerol.   MONITORING:  Oxygen saturation, blood pressure, pulse of this patient were  monitored throughout the entirety of both procedures. Conscious sedation was  with IV Versed and Demerol in incremental doses.   INSTRUMENT USED:  Olympus video chip adult gastroscope and colonoscope.   EGD TECHNIQUE/FINDINGS:  Examination of the tubular esophagus revealed no  mucosal abnormalities. The GE junction was easily traversed including  stomach. The gastric cavity was emptied and insufflated well with air. A  thorough examination of the gastric mucosa including retroflexed view of the  proximal stomach and esophagogastric junction showed no abnormalities. The  pylorus was patent and easily traversed into the duodenum. The duodenum,  duodenal bulb and second portion  appeared okay except for a few posterior  bulbar erosions. Please see photos. Therapeutic/diagnostic maneuver was  done. The patient tolerated the procedure well and was prepared for  colonoscopy.   COLONOSCOPIC TECHNIQUE/FINDINGS:  Digital rectal exam revealed no  abnormalities. Unfortunately, the prep was poor with what appear to be  dozens of watermelon seeds and chunks of watermelon throughout the colon.  There was quite a bit of chunky fluid which could not be suctioned or  adequately washed. Examination of the rectum was incomplete, however, there  were multiple 3-5 mm sessile polyps; these were destroyed. One was removed  with a snare and submitted to the pathologist.   COLON:  Colonic mucosa was surveyed from the rectosigmoid junction to the  left transverse to right colon to the area of the appendiceal orifice,  ileocecal valve and cecum. I did reach the cecum, however, it was  incompletely seen due to the fluid meniscus and the food as mentioned above.  In spite of changing of the patient's position, I was unable to  get it out  of the way to adequately see all of the mucosa from the level of the cecum  and ileocecal valve. The scope was withdrawn and all previously mentioned  mucosal surfaces were again seen. At 45 cm there was a 5 mm polyp on a  stalk; it was snared and recovered. No other lesions were seen, but again,  the colon was incompletely seen.   DISPOSITION:  The patient tolerated both procedures well and was reactive.   ENDOSCOPY IMPRESSION:  1. Normal esophagus.  2. Normal stomach.  3. Posterior bulbar erosions. The remainder of duodenum first and second     portions appeared normal.   COLONOSCOPY IMPRESSION:  1. Dimunitive polyps in the rectum, snared and/or destroyed as described     above.  2. Polyp at 45 cm, removed with a snare. The remainder of the colon was     incompletely seen due to poor prep.   RECOMMENDATIONS:  1. Will proceed with a CBC, LFTs,  amylase, CMET-7 today.  2. Will also obtain H. Pylori serologies.  3. Will proceed with a CT of the abdomen and pelvis with IV oral contrast to     further evaluate her abdominal pain as it is not explained based on     today's findings.  4. In addition, in three to four weeks she will need a return for an air     contrast barium enema to image the colon not seen today.  5. No aspirin or arthritis medications for ten days.  6. Further recommendations to follow.                                               Gerrit Friends. Rourk, M.D.    RMR/MEDQ  D:  02/13/2002  T:  02/14/2002  Job:  [id number]   cc:   Lorin Picket A. Gerda Diss, M.D.

## 2010-11-25 NOTE — Op Note (Signed)
NAME:  MELONEE, GERSTEL NO.:  [id number]   MEDICAL RECORD NO.:  [id number]                   PATIENT TYPE:   LOCATION:                                       FACILITY:   PHYSICIAN:  Tilda Burrow, M.D.              DATE OF BIRTH:   DATE OF PROCEDURE:  03/20/2003  DATE OF DISCHARGE:                                 OPERATIVE REPORT   PREOPERATIVE DIAGNOSES:  1. Menorrhagia, recurrent anemia.  2. Right lower quadrant pain with suspected pelvic adhesions.   POSTOPERATIVE DIAGNOSES:  1. Menorrhagia, recurrent anemia.  2. Right lower quadrant pain with suspected pelvic adhesions.  3. Umbilical hernia.  4. Probable endometriosis.   PROCEDURE:  1. Total abdominal hysterectomy.  2. Left salpingo-oophorectomy.  3. Umbilical herniorrhaphy.  4. Lysis of adhesions ([place])   SURGEON:  Tilda Burrow, M.D.   ASSISTANT:  Amie Critchley, CST; Small, RN   ANESTHESIA:  General -- Nelda Severe, CRNA   COMPLICATIONS:  None.   FINDINGS:  Extensive pelvic and lower abdominal adhesions with sigmoid colon  reaching all the way over to the right side wall, quite large and  voluminous.  Left ovary densely adherent along left pelvic side wall with  thickening of the peritoneum; and a small chocolate cyst at the distal tip  of the ovary most consistent with endometrioma.  Obliterated cul-de-sac with  adhesions.  Mobile anterior uterus, deep pelvis.   DETAILS OF THE PROCEDURE:  The patient was taken to the operating room  prepped and draped for lower abdominal surgery with Foley catheter in place  and vaginal prepping performed.  Midline lower abdominal incision was  repeated with great care and the omentum identified and found to be adherent  to the anterior abdominal wall with attendant adhesions over a wide area  below the umbilicus.   We peeled the omentum off the anterior abdominal wall using sharp-and-blunt  dissection, with good tissue plane identification and  results.  We  identified a small, 1 cm wide umbilical hernia that protruded through  approximately 3 cm into the periumbilical fat.  The vertical incision was  extended into the umbilical hernia with the hernia sac itself trimmed and  excised.  We inspected the pelvis then and gradually freed the omentum up in  its entirety.  There was an 0 window in the omentum from her prior surgery  and this omentum was opened along its margin, so that there was no internal  hernia potential. We proceeded with elevation of the bowel and inspection of  the pelvis which showed the uterine fundus easily visible and the sigmoid  colon flipping all the way over to the right pelvic sidewall and coming back  across the back of the uterus.   Lahey thyroid tenaculum was attached to the uterine fundus which was then  elevated and the sidewall freed up from the sigmoid colon. The sigmoid was  then  sharply dissected free from its thin adhesions to the back of the  uterus.  The bowel was then repacked and elevated and visualization was  improved; but it was still technically challenging due to the very deep  pelvic structure.  We then proceeded with sharp dissection of the cul-de-sac  as much as possible.  The left ovary was densely adherent on the left  sidewall and the left half of the cul-de-sac over the ureterosacral  ligament.  The ovary was quite elongate with a distal fibrotic area that  felt like there had been longstanding scarring and indeed there was  endometrioma in this scar tissue as we dissected free.   The round ligaments were taken down on either side and then the small  remnants of the broad ligament on the right were clamped cut and suture  ligated using 2-0 chromic.  The bladder flap was developed anteriorly.  Uterine vessels on the right side were then clamped with curved Heaney  clamp, Kelly clamp placed for backbleeding, transected and then #0 chromic  suture ligated. A second ligature was  placed over the right uterine vessels.   The left side had further dissection performed so that we could free up the  adnexal structures.  The tube had already been removed.  The remnants of the  broad ligament were disrupted during the dissection.  We were able to  isolate the utero-ovarian ligament on the left side; doubly clamp it;  transect it; and ligate it on both sides.  We then marched down the uterus  clamping, cutting and suture ligating using curved Heaney clamps, knife  dissection and #0 chromic suture ligature.  We marched down to the level of  the cervix which was very deep inside the pelvis; and then we were able to  make a stab incision in the anterior cervical vaginal fornix.   Our initial estimate of where to place the incision was too high and a lower  stab incision was necessary once we were properly oriented.  We then were  able to open the vaginal cuff and excise the cervix in its entirety.  The  back, ureterosacral ligament attachment area was very thickened, dense and  fibrotic; and then, in order to free this up, we had to amputate the uterine  body off of the cervix so that we could rotate the cervix cephalad and cross  cut the dense uterosacral ligament attachments.  Four Kocher clamps were  placed on the vaginal cuff which were then closed with a series of  interrupted 2-0 chromic sutures.  We then inspected the pelvis, irrigated  and then proceeded with left salpingo-oophorectomy by identification of the  infundibulopelvic ligament, confirmation that we were well away from the  area where the ureter should be though it was not distinctly visible, and  then we transected, clamped, cut, and suture ligated.  The rest of the tip  of the attachments from the ovary to the lateral sidewall were sharply  dissected free.  We did not enter the retroperitoneum in the area where the  ureter should be.  Pelvic irrigation showed hemostasis.  The bowel was inspected and  no  evidence of trauma identified.  The patient then allowed to go to recovery  after irrigation of the pelvis, closure of the anterior peritoneum partially  with 2-0 chromic to the peritoneum, then continuous running 0 monofilament  closure of the fascia, then interrupted 2-0 plain closure of the  subcutaneous space and staple closure of  the skin.  Irrigation was performed  at each layer.  We then proceeded allowing the patient to awaken, and go to  the recovery room in good condition.   ESTIMATED BLOOD LOSS:  250 cc      JVF/MEDQ  D:  03/20/2003  T:  03/21/2003  Job:  [id number]

## 2010-11-25 NOTE — H&P (Signed)
[facility]  Patient:    [name], [name] Visit Number: [id number] MRN: [id number]          Service Type: OUT Location: RAD Attending Physician:  [name] Dictated by:   Christin Bach, M.D. Admit Date:  06/20/2001 Discharge Date: 06/20/2001   CC:         Mel Almond, M.D.                         History and Physical  ADMISSION DIAGNOSIS:  Symptomatic 8 cm right ovarian cyst.  HISTORY OF PRESENT ILLNESS:  This 50 year old female is referred from the office of W. [name], M.D., for evaluation of a symptomatic right ovarian cyst.  She has been seen in our office in referral after having abdominal pain for which MRI showed a 9 cm ovarian cyst with a single relatively thin septation through it.  The CA-125 level was within normal limits at 8.7.  The patient has been on oral contraceptives with no suppression of the ovary.  The repeat ultrasound shows the cyst to be persistent.  After discussion of the treatment options and continued observation, the patient is inclined to proceed with surgical removal.  In discussion with the patient of her desire for type of surgery, the patient is strongly inclined to only have the minimum amount done for this cyst.  In other words, she does not wish to have hysterectomy considered due to apprehension regarding changes in overall wellness and/or sexual function.  PAST MEDICAL HISTORY:  Borderline mental function.  PAST SURGICAL HISTORY:  Positive for history of gonorrhea and chlamydia. History of hypertension, currently not taking medications.  Positive for rheumatic fever as a child.  PAST SURGICAL HISTORY:  None.  HABITS:  Cigarettes:  One-half pack per day.  MEDICATIONS: 1. Birth control pills. 2. Zyrtec. 3. Lozol 2.5 mg one q.a.m. 4. Kay-Ciel 10 mEq one p.o. q.d.  PHYSICAL EXAMINATION:  Height 5 feet 8 inches.  Weight 268 pounds.  VITAL SIGNS:  Blood pressure 120/82.  GENERAL APPEARANCE:   An overweight, large-framed, African-American female. Alert and oriented x 3.  HEENT:  Pupils equal, round, and reactive.  Extraocular movements intact.  NECK:  Supple.  Trachea midline.  CHEST:  Clear to auscultation.  ABDOMEN:  Nontender.  Obese without masses appreciable per abdomen.  PELVIC:  External genitalia nulliparous.  Vaginal exam with normal secretions. Cervix nulliparous.  Uterus anterior.  Slightly tender right adnexa.  EXTREMITIES:  Grossly normal.  ASSESSMENT: 1. Controlled hypertension. 2. Symptomatic right ovarian cyst.  PLAN:  Proceed to laparotomy and right salpingo-oophorectomy with plans to remove other structures only if malignancy or other pathologic findings encountered. Dictated by:   Christin Bach, M.D. Attending Physician:  [name] DD:  08/06/01 TD:  08/06/01 Job: [id number]

## 2010-11-25 NOTE — Discharge Summary (Signed)
NAME:  [name], [name]                          ACCOUNT NO.:  [id number]   MEDICAL RECORD NO.:  [id number]                   PATIENT TYPE:  INP   LOCATION:  A319                                 FACILITY:  [facility]   PHYSICIAN:  Tilda Burrow, M.D.              DATE OF BIRTH:  April 30, 1961   DATE OF ADMISSION:  03/20/2003  DATE OF DISCHARGE:  03/24/2003                                 DISCHARGE SUMMARY   HISTORY OF PRESENT ILLNESS:  @@   ADMISSION DIAGNOSES:  1. Recurrent menorrhagia and anemia secondary to heavy menses.  2. Right lower quadrant pain.   DISCHARGE DIAGNOSES:  1. Recurrent menorrhagia resolved.  2. Anemia secondary to heavy menses, corrected.  3. Right lower quadrant pain.   PROCEDURE:  On March 20, 2003: Total abdominal hysterectomy and left  oophorectomy.  On March 23, 2003:  Transfusion of 2 units of packed cells.   SURGEON:  Tilda Burrow, M.D. performed on the day of admission,  March 20, 2003.   DISCHARGE MEDICATIONS:  1. Alprazolam 0.25 mg b.i.d.  2. Nexium 40 mg p.o. daily.  3. Lisinopril 5 mg p.o. daily.  4. Furosemide 20 mg daily.  5. Potassium 10 mEq daily.  6. Zyprexa 7.5 mg p.o. daily.  7. Fluoxetine 20 mg p.o. daily.  8. Trazodone 50 mg p.o. daily.  9. Additionally, at discharge, Tylox 15 tablets 1 q.4h. p.r.n. pain and     Keflex 500 mg q.i.d. x7 days.   HOSPITAL SUMMARY:  A 50 year old female gravida 5, para 3, AB 2 was admitted  for hysterectomy and removal of ovary.  She was considered for endometrial  ablation, but after discussion of her right lower quadrant pain we decided  that exploration was appropriate and likely to assist.   PAST MEDICAL HISTORY:  Positive for borderline mental function.  History of  GC and Chlamydia in the past. Currently negative.  History of rheumatic  fever.   PAST SURGICAL HISTORY:  Laparotomy, and right salpingo-oophorectomy, left  salpingectomy in January 2003.   PHYSICAL EXAMINATION:   Height 5 feet 8 inches, weight 266.  Blood pressure  130/70 with history notable and the patient gives an amazing and somewhat  dubious history of having weighed 800 pounds as a child and having weighed  as low at 130 pounds in the past.   HOSPITAL COURSE:  The patient was admitted, underwent hysterectomy as  described in the operative note with 200 cc blood loss.  The procedure was  notable for extensive pelvic adhesions with the sigmoid colon quite large  and stuck all the way to the right pelvic side wall.  The left ovary was  scarred into the left cul-de-sac and suspected endometriosis was felt to be  present due to chocolate cysts within the ovary.  It is also possible these  represent partially resolved hemorrhagic corpus luteum cysts.  Pathology  report is pending  at the time of this dictation.   The patient had stable hospital course, had hemoglobin on admission of 9.4,  hematocrit 29.3, and had immediate postop hemoglobin of 9.3 and hematocrit  29.2.  After reequilibration it returned at 8.6 and 27.0 and then leveled  out at 7.9 on postoperative day 3; at which time we chose to transfuse. She  had only 1 single febrile episode just before transfusions were initiated  and she was stable for discharge the following day with bowel passage of gas  from her rectum, with unchanged abdominal girth, incision looking clean and  healthy and tolerating a regular diet.  Because of the single isolated fever  we put her on a prophylactic regimen of Keflex at discharge even though she  was afebrile at the time of this discharge.  Follow up in 1 week with staple  removal and then 4 weeks.                                               Tilda Burrow, M.D.    JVF/MEDQ  D:  03/24/2003  T:  03/24/2003  Job:  [id number]   cc:   Donna Bernard, M.D.  [address]  [place]  [place] [address]  Fax: [phone]

## 2010-11-25 NOTE — Consult Note (Signed)
NAMEZANNIE, LOCASTRO                ACCOUNT NO.:  [id number]   MEDICAL RECORD NO.:  [id number]          PATIENT TYPE:  EMS   LOCATION:  MAJO                         FACILITY:  [facility]   PHYSICIAN:  Michael L. Reynolds, M.D.DATE OF BIRTH:  16-Sep-1960   DATE OF CONSULTATION:  04/21/2005  DATE OF DISCHARGE:                                   CONSULTATION   REFERRING PHYSICIAN:  [facility].   CHIEF COMPLAINT:  Code stroke.   HISTORY OF PRESENT ILLNESS:  This is the initial inpatient  consultation  evaluation of this 50 year old woman with reported past medical history  including hypertension, diabetes, heart disease and stroke.  The patient  reports that she was sitting with friends this evening when she suddenly  felt short of breath.  She may have had some chest pain at the time.  She  suddenly became unable to talk although she was aware of things going on  around her.  She then suffered a witnessed loss of consciousness without  obvious seizure activity.  According to EMS report when she awoke she was  not speaking and reportedly had left-sided weakness.  In transport she began  to speak a little bit and began to move all extremities.  In the emergency  room the patient is able to talk and move all extremities.  She complains of  chest pain at present.  Code stroke was called because of EMS information  relayed to the emergency room.   PAST MEDICAL HISTORY:  Hypertension, diabetes, strokes and heart disease all  per the patient.  We have no old records about any of that.  She is morbidly  obese.   REVIEW OF SYSTEMS:  Remarkable for chest pain, palpitations, shortness of  breath, nausea and vomiting all of which are ongoing right now.   PHYSICAL EXAMINATION:  VITAL SIGNS:  Temperature 97.2, blood pressure  125/68, pulse 75, respirations 20.  GENERAL:  This is an obese, healthy-appearing woman supine in no distress.  HEENT:  Normocephalic, atraumatic.  Oropharynx  benign.  NECK:  Supple without carotid bruits.  HEART:  Regular rate and rhythm without murmurs.  NEUROLOGIC:  Mental status she is awake, alert, she is fully oriented and  follows commands.  Her speech is forced quality but is appropriate in  content.  Cranial nerves extraocular movements full without nystagmus.  Pupils are round and reactive.  Face, tongue and palate move normally and  symmetrically.  Motor normal bulk and tone, normal strength in all tested  extremity muscles.  Reflexes are 2+ and symmetric.  Toes are downgoing  bilaterally.   LABORATORY DATA:  CT of the head was personally reviewed and is normal.  Specifically there is no evidence of old strokes and no evidence of an acute  infarct.   IMPRESSION:  A syncopal event, unlikely to be a transient ischemic attack.   PLAN:  We will defer further workup to the treating emergency department  physician and we will reconsult as needed.      Michael L. Thad Ranger, M.D.  Electronically Signed     MLR/MEDQ  D:  04/21/2005  T:  04/22/2005  Job:  [id number]

## 2010-11-25 NOTE — Consult Note (Signed)
NAMEJARA, Bryant                            ACCOUNT NO.:  [id number]   MEDICAL RECORD NO.:  [id number]                   PATIENT TYPE:   LOCATION:                                       FACILITY:   PHYSICIAN:  Gerrit Friends. Rourk, M.D.               DATE OF BIRTH:  [birth year]   DATE OF CONSULTATION:  02/06/2002  DATE OF DISCHARGE:                           GASTROENTEROLOGY CONSULTATION   REASON FOR CONSULTATION:  Black, tarry stools, red blood per rectum.   HISTORY OF PRESENT ILLNESS:  [name] is a 50 year old African-  American mentally-challenged female sent over at the courtesy of Scott A.  Gerda Diss, M.D., to evaluate a couple of months' history of nonspecific  generalized abdominal pain, intermittent nausea and vomiting, some  postprandial component to her abdominal pain, black stools, and some gross  blood per rectum.  [name] tells me that she has lost down from 350 pounds  one year ago to her current weight of 257 pounds today.  She furthermore  stated that at one time she weighed 800 pounds, but has lost that weight  over time.  She has not had any fever or chills.  She was found to be  Hemoccult-negative in Dr. Fletcher Anon office.  She reportedly also had an  abdominal ultrasound to check for gallstones (I do not have that study  report for review at this time).  She does not have odynophagia or  dysphagia.  She has been started on a PPI (agent unknown) recently for her  symptoms; however, she cannot tell much difference for that medication.  She  is accompanied by [name], her mental health counselor.  There has been  no apparent history of peptic ulcer disease or GI bleeding previously.  She  tells me her aunt died of colon cancer.   PAST MEDICAL HISTORY:  Significant for glaucoma, mental retardation.   PAST SURGICAL HISTORY:  Right oophorectomy, wisdom teeth extraction,  bilateral tympanostomy tubes, bilateral surgery for glaucoma.   ALLERGIES:  PENICILLIN,  ASPIRIN cause convulsions.   MEDICATIONS:  Potassium supplement, indapamide, ibuprofen (prescription for  #35 12/03), potassium,  Zyprexa, fluoxetine.   FAMILY HISTORY:  Mother succumbed to an MI, father had stroke and diabetes.  She probably has 19 siblings, apparently in good health.   SOCIAL HISTORY:  The patient has been married for 21 years, has three  children.  Disabled due to her mental condition.  Smokes a half-pack of  cigarettes per day.  She does not use alcohol.   REVIEW OF SYSTEMS:  No chest pain, dyspnea on exertion, weight loss is  chronicled above.   PHYSICAL EXAMINATION:  GENERAL:  A pleasant, obese 50 year old lady  accompanied by her caregiver, [name].  SKIN:  Warm and dry.  HEENT:  No scleral icterus.  Conjunctivae are pink.  Oral cavity:  No  lesions.  CHEST:  Lungs are clear to auscultation.  CARDIAC:  Regular rate and rhythm without murmur, gallop, or rub.  ABDOMEN:  Very obese.  Well-healed infraumbilical vertical surgical scar.  Has bowel sounds.  Abdomen is mildly diffusely tender without obvious mass  or organomegaly.  EXTREMITIES:  No edema.  RECTAL:  Deferred until the time of colonoscopy.   IMPRESSION:  [name] is a 50 year old lady with a nonspecific  abdominal pain with a postprandial component, some subjective anorexia and  reported massive weight loss (though the caregiver doubts the degree of  weight loss claim).  Has had black, tarry stools and gross blood per rectum.  She was Hemoccult-negative at Dr. Fletcher Anon office.   She apparently was taking ibuprofen for at least a brief period of time back  in May, possibly June, prescribed by Dr. Emelda Fear.   RECOMMENDATIONS:  Will go ahead and give the symptoms the benefit of the  doubt and plan to perform both EGD and colonoscopy in the near future at  [facility] to further evaluate her symptoms.  This approach has  been discussed with [name] and her caregiver at some  length.  Potential  risks, benefits, and alternatives have been reviewed and questions answered.  She is agreeable.  I feel she is at low risk for conscious sedation and will  give her Versed and Demerol.  Will also try to retrieve any recent  ultrasound study reports that are available for review.  Further  recommendations to follow.   I would like to thank Dr. [name] for allowing me to see this nice lady  today.                                                Gerrit Friends. Rourk, M.D.    RMR/MEDQ  D:  02/06/2002  T:  02/08/2002  Job:  [id number]   cc:   Lorin Picket A. Gerda Diss, M.D.

## 2010-11-25 NOTE — Discharge Summary (Signed)
[facility]  Patient:    [name], [name] Visit Number: [id number] MRN: [id number]          Service Type: SUR Location: 4A A420 01 Attending Physician:  Tilda Burrow Dictated by:   Christin Bach, M.D. Admit Date:  08/08/2001 Discharge Date: 08/12/2001   CC:         [name], M.D., [facility]   Discharge Summary  ADMITTING DIAGNOSIS:  Symptomatic 8 cm right ovarian cyst.  DISCHARGE DIAGNOSES: 1. Right hydrosalpinx, left hydrosalpinx. 2. Right ovarian cyst.  PROCEDURE PERFORMED: 1. August 08, 2001, right salpingo-oophorectomy and left salpingectomy. 2. Removal of pedunculated uterine fibroid.  DISCHARGE MEDICATIONS: 1. Tylox 1 q.4h. p.r.n. pain, dispense 15 tablets. 2. Zyrtec. 3. Lozol 2.5 mg 1 p.o. q.a.m. 4. Kay Ciel 10 mEq 1 p.o. q.d.  FOLLOWUP:  In 4 days for staple removal, August 16, 2001.  HISTORY OF PRESENT ILLNESS:  This 50 year old female was referred for evaluation of a symptomatic right ovarian cyst.  She was followed in our office after referral for abdominal pain.  MRI showed a 9 cm ovarian cyst with a single thin septation.  CA-125 was within normal limits at 8.7.  The patient strongly desires to avoid hysterectomy, due to apprehension regarding overall wellness and sexual function.  MEDICAL HISTORY:  Hypertension.  PAST MEDICAL HISTORY: 1. Borderline mental function. 2. Positive for a history of gonorrhea and chlamydia 3. Rheumatic fever as a child.  PAST SURGICAL HISTORY:  Negative.  HABITS:  Cigarettes 1/2 pack per day.  PHYSICAL EXAMINATION:  VITAL SIGNS:  Height 5 feet 8 inches.  Weight 268 pounds.  GENERAL:  She is an obese African-American female with pelvic examination showing uterus anterior, slight tender right adnexa with previously mentioned findings on the right ovary.  HOSPITAL COURSE:  The patient underwent right salpingo-oophorectomy and left salpingectomy and removal of  pedunculated fibroid as described in the operative note.  She had postoperative care showing some oozing in the dressing in the initial time.  Pulse remained in the 100s with blood pressure stable at 120-140/70-80.  The patient has postoperatively hemoglobin 10.6, hematocrit 30.5.  White count 8,500, considered stable.  She had a postoperatively ileus requiring slow return to eating.  On August 11, 2001, she was able to have return of full bowel function including bowel movements.  Incision was clear.  She was afebrile.  She was considered stable for discharge.  FOLLOWUP:  She was to follow up in 4 days for staple removal and then postoperative check in 1 month.  ADDENDUM:  Pathology report showed the cyst was consistent with fallopian tube.  There was no ovarian tissue identified in the pathology specimen but in review of the operative note, dictated immediately after the surgery, there was evidence that the right ovary was removed at the time of surgery and was perhaps obscured by the hydrosalpinx. Dictated by:   Christin Bach, M.D. Attending Physician:  Tilda Burrow DD:  09/09/01 TD:  09/09/01 Job: [id number]

## 2010-11-25 NOTE — Op Note (Signed)
[name], [name]                ACCOUNT NO.:  [id number]   MEDICAL RECORD NO.:  [id number]          PATIENT TYPE:  OBV   LOCATION:  A418                          FACILITY:  [facility]   PHYSICIAN:  Tilda Burrow, M.D. DATE OF BIRTH:  [birth year]   DATE OF PROCEDURE:  09/13/2006  DATE OF DISCHARGE:                               OPERATIVE REPORT   PREOPERATIVE DIAGNOSIS:  Rectocele and mental retardation.   POSTOPERATIVE DIAGNOSIS:  Rectocele and mental retardation.   PROCEDURE:  Posterior repair.   SURGEON:  Tilda Burrow, M.D.   ASSISTANTMarlana Salvage, CST   ANESTHESIA:  General.   COMPLICATIONS:  None.   FINDINGS:  Posterior peritoneal laxity, right pararectal site specific  defect identified and corrected.   DESCRIPTION OF PROCEDURE:  The patient was taken to the operating room  and prepped and draped for vaginal procedure.  Preoperative rectal exam  was performed revealing a pouch in the rectal area in the lower 8-10 cm  of the rectum that needed repair.  The vaginal mucosa was split 1 cm  inside the hymenal ring, lifted, and dissected free laterally on either  side.  You could feel strong supportive tissues on either side that were  connected well laterally and with a central defect.  On the right side,  the tissues were thicker.  An Allis clamp was used to grasp the right  side which, upon traction, could be stretched and lifted cephalad and to  the right and made dramatic improvement in the rectal support.  This  gave evidence that there was a linear tear down the right side of the  rectum probably from prior obstetric deliveries.  On the right side, you  could identify the connective tissue further in the sidewall.  A series  of three separate mattress sutures were used to do the site specific  repair on the right side, pulling the tissues on the left side over.  The result was significantly improved actually resulting in a dimple in  the perineal body due to the  improved support.  This was tied down.  Sterile gloves were, of course, changed as necessary during the  procedure.  The vaginal mucosa was then reapproximated with two sutures  in the perineal body to pull the tissue together.  No trimming of the  mucosa was necessary.  A repeat rectal exam showed that there were no  sutures in the rectum and no forward pouch noted before the procedure.  The patient went to the recovery room in good condition.  She received  Cleocin preoperatively.  Sponge and needle counts were correct.   ADDENDUM:  It should be noted that immediately prior to procedure,  [name] complained of extreme discomfort due to leg ache in both  legs, knees, calves, feet, altogether, without focal complaint.  She  states it has  been going on for three weeks and seems to be musculoskeletal or  arthritic in origin.  There is no leg lift pain.  The patient was  actually more comfortable in the legs flexed forward position, which was  the  position we put her in before being put to sleep and used the same  position throughout the procedure.      Tilda Burrow, M.D.  Electronically Signed     JVF/MEDQ  D:  09/13/2006  T:  09/13/2006  Job:  [id number]   cc:   Lorin Picket A. Gerda Diss, MD  Fax: [phone]

## 2010-11-25 NOTE — Op Note (Signed)
NAME:  [name], [name]                          ACCOUNT NO.:  [id number]   MEDICAL RECORD NO.:  [id number]                   PATIENT TYPE:  AMB   LOCATION:  DAY                                  FACILITY:  [facility]   PHYSICIAN:  R. Roetta Sessions, M.D.              DATE OF BIRTH:  06-14-1961   DATE OF PROCEDURE:  05/05/2002  DATE OF DISCHARGE:                                 OPERATIVE REPORT   PROCEDURE:  Colonoscopy with biopsy.   INDICATIONS FOR PROCEDURE:  The patient's a 50 year old lady who underwent a  colonoscopy a few weeks ago.  Had enemas ____________  from the rectum.  Poor prep concluded complete examination.  IV contrast barium enema  demonstrated a fixed 1-cm filling defect.  The patient returns for  colonoscopy in hopefully the setting of a good prep.  This approach has been  discussed with the patient previously.  Potential risks, benefits, and  alternatives have been reviewed.  Please seem my 03/12/02 H&P for more  information.   PROCEDURE NOTE:  O2 saturation, blood pressure, pulse, respirations were  monitored throughout the entire procedure.   CONSCIOUS SEDATION:  Versed 4 mg IV, Demerol 50 mg IV in divided doses.   INSTRUMENT USED:  Olympus video chip adult colonoscope.   FINDINGS:  Digital rectal exam revealed no abnormalities.   ENDOSCOPIC FINDINGS:  The prep was good.   Rectum:  Examination of the rectal mucosa including retroflex view of the  anal verge revealed a couple of 3-mm dimunitive polyps.  The remainder of  the rectal mucosa appeared normal.   Colon:  Colonic mucosa was surveyed from the rectosigmoid junction through  the left transverse right colon to the appendiceal orifice and ileocecal  valve and cecum.  These structures are well seen.  The patient had scattered  left-sided diverticula (few).  There were again some 3-mm diminutive polyps  on the descending and sigmoid mucosa.  I was unable to identify a 1-cm or  larger polyp from the level of  the cecum to ileocecal valve (see photos).  The scope was slowly withdrawn.  All previously mentioned mucosal surfaces  were again seen.  The diminutive polyps in the left colon and rectum were  cold biopsied/removed.  The patient tolerated the procedure well and was  reactive at end of endoscopy.   IMPRESSION:  Dimunitive polyps in the rectum and left colon, cold  biopsied/removed.  Scattered few left-sided diverticula.  Regular colonic  mucosa appeared normal.    RECOMMENDATIONS:  1. Follow up on pap.  2. Diverticulosis literature provided to the patient.  3. Further recommendations to follow.                                               Jonathon Bellows,  M.D.    RMR/MEDQ  D:  05/05/2002  T:  05/05/2002  Job:  [id number]   cc:   Lorin Picket A. Gerda Diss, M.D.

## 2010-11-25 NOTE — H&P (Signed)
[name], [name]                ACCOUNT NO.:  [id number]   MEDICAL RECORD NO.:  [id number]          PATIENT TYPE:  INP   LOCATION:  A318                          FACILITY:  [facility]   PHYSICIAN:  Donna Bernard, M.D.DATE OF BIRTH:  23-Apr-1961   DATE OF ADMISSION:  01/31/2006  DATE OF DISCHARGE:  LH                                HISTORY & PHYSICAL   CHIEF COMPLAINT:  Passing out spell.   SUBJECTIVE:  This patient is a 50 year old black female with a history of  hypertension, type 2 diabetes mellitus, mental retardation, and depression  who was brought to ER via EMS.  She was found incoherent and collapsed in  the hallway of her apartment.  The patient was found to have a very low  sugar.  She was given D50 ampule en route and another one when she reached  the ER.  She was then given a meal which she ate it entirely.  Her sugar  dropped again into the low 70s after the meal.  The patient could not recall  taking excess amounts of medicines.  Due to her mental retardation her  medicines are in a bubble wrap.  She does admit, though, to skipping meals.  The patient is accompanied by her sister.  The best her sister can recall  the patient has been taking her medicines.   Her medicines include:  1.  Abilify 5 mg q.h.s.  2.  Zoloft 50 mg q.a.m.  3.  Glynase 3 mg one p.o. q.a.m.  4.  Lasix 40 mg one daily.  5.  K-Tab 20 mEq one b.i.d.  6.  Albumin p.r.n.  7.  Trazodone 50 mg q.h.s.  8.  Omeprazole 20 mg q.a.m.  9.  Prinivil 5 mg each morning.   FAMILY HISTORY:  Positive for hypertension, diabetes.   PRIOR SURGERIES:  Remote ovarian cyst, colonoscopy with a polyp in 2003,  hysterectomy in 2004.   ALLERGIES:  1.  PENICILLIN.  2.  ASPIRIN.  3.  According to the patient, TYLENOL.   SOCIAL:  The patient is disabled, smokes a third of a pack a day, no alcohol  use.   REVIEW OF SYSTEMS:  Otherwise negative.   PHYSICAL EXAMINATION:  VITAL SIGNS:  Temperature afebrile, BP  142/80.  GENERAL:  The patient after several hours in the emergency room in alert, no  acute distress.  HEENT:  Normal.  NECK:  Supple.  LUNGS:  Clear.  HEART:  Regular rate and rhythm.  ABDOMEN:  Nontender.  NEUROLOGIC:  No focal deficits.  EXTREMITIES:  Normal.   SIGNIFICANT LABORATORY:  CBC:  White blood count 4.8, hemoglobin 12.9.  MET-  7:  Potassium 3.4.  Liver enzymes are slightly up:  AST 39, ALT 48.  Urinalysis negative.   IMPRESSION:  1.  Persistent hypoglycemia in someone with questionable oral intake and      uncertain compliance.  There is also medication confusion.  For      instance, metformin was stopped earlier in the year and yet the patient      called for refill on  this earlier in this month.  2.  Type 2 diabetes.  3.  Depression.  4.  Mental retardation.   PLAN:  Bring in for observation.  Further orders as noted in the chart.      Donna Bernard, M.D.  Electronically Signed     WSL/MEDQ  D:  02/01/2006  T:  02/01/2006  Job:  [id number]

## 2010-11-25 NOTE — H&P (Signed)
NAME:  [name], [name]                          ACCOUNT NO.:  [id number]   MEDICAL RECORD NO.:  [id number]                  PATIENT TYPE:   LOCATION:                                       FACILITY:   PHYSICIAN:  Gerrit Friends. Rourk, M.D.               DATE OF BIRTH:  March 21, 1961   DATE OF ADMISSION:  04/08/2002  DATE OF DISCHARGE:                                HISTORY & PHYSICAL   REASON FOR ADMISSION:  Recent colonoscopy with polypectomy and removal of  adenomatous polyp in the rectum, incomplete due to a poor prep.  BE  followed, which revealed a 1 cm fixed lesion at the splenic flexure.  EGD  then was positive for some duodenal bulbar erosions only.  She had evidence  of H. pylori based on serology.  She was treated with triple drug therapy.  She has had intermittent upper abdominal pain, nausea and vomiting.  These  symptoms have persisted since we last saw this nice lady.  She has lost a  tremendous amount of weight.  A CT scan of the abdomen and pelvis  demonstrated small simple cyst of the left ovary, which was confirmed by a  pelvic ultrasound.  CT also demonstrated diffuse fatty infiltration of the  liver and some left para-aortic lymph nodes.  She is having no odynophagia  or dysphagia.  She has Nexium on hand at home and has been taking it.  Recently she does have some reflux symptoms.  She also notes she has not had  a bowel movement in four days and complains of chronic constipation.   PAST MEDICAL HISTORY:  Significant for glaucoma, mental retardation.   PAST SURGICAL HISTORY:  Right oophorectomy, wisdom teeth extraction,  myringotomy in the TMs bilaterally, glaucoma surgery.   MEDICATIONS:  1. Zyprexa 7.5 mg daily.  2. Prozac 20 mg at bedtime.  3. Potassium 20 mEq daily.  4. Provera 10 mg seven days out of the month.   ALLERGIES:  PENICILLIN, ASPIRIN, convulsions.   FAMILY HISTORY:  Aunt succumbed to colorectal carcinoma, otherwise negative  for chronic GI or  liver disease.   SOCIAL HISTORY:  The patient is married for 21 years and three children.  Disabled due to mental status.  Lives in [place] in [place].  Smokes a half-pack of cigarettes per day.  No alcohol.   REVIEW OF SYMPTOMS:  No chest pain, no dyspnea, no fevers or chills.   PHYSICAL EXAMINATION:  GENERAL:  A pleasant 50 year old lady resting  comfortably, accompanied by caregiver.  VITAL SIGNS:  Weight 253, temperature 98.6,  BP 130/70, pulse 96.  SKIN:  Warm and dry.  HEENT:  No scleral icterus.  Conjunctivae pink.  Oral cavity:  No lesions.  CHEST:  Lungs are clear to auscultation.  CARDIAC:  Regular rate and rhythm without murmur, gallop, or rub.  ABDOMEN:  Nondistended.  Still remains obese.  Positive bowel sounds.  There  is vague upper abdominal pain to palpation.  No obvious mass or  organomegaly.   ASSESSMENT:  The patient has had some intermittent nausea and vomiting and  upper abdominal pain.  I would certainly be concerned about evolving biliary  tract disease, i.e., cholelithiasis given her reported massive weight loss.  Amylase previously normal, LFTs completely normal except for SGOT of 47.  Mild anemia with an H&H of 11.5 and 34.4, MCV 80.5.   Incomplete colonoscopy previously secondary to a poor prep.  Filling defect  at the splenic flexure, which was fixed, on recent air contrast barium  enema.  History of Helicobacter pylori gastritis based on serology.  She is  status post treatment with triple drug therapy.   There admittedly is no biliary dilation on recent CT.   RECOMMENDATIONS:  1. Will go ahead and treat her for constipation today with a 10 ounce bottle     of magnesium citrate to be consumed this afternoon.  We will proceed with     a right upper quadrant ultrasound.  2. She is to resume Nexium 40 mg orally daily empirically for     gastroesophageal reflux disease.  3. Will plan a colonoscopy, hopefully in the setting of a good  prep, later     in the month.  She will go on clear liquids for three days, will be given     a GoLYTELY purge.  Hopefully, she will have adequate prep.  Further     recommendations to follow.  I have again discussed the approach of     colonoscopy along with the potential risks, benefits, and alternatives.     Her questions were answered, and she is agreeable.  I feel she is at low     risk for conscious sedation.  Further recommendations to follow.                                                  Gerrit Friends. Rourk, M.D.    RMR/MEDQ  D:  04/08/2002  T:  04/08/2002  Job:  [id number]   cc:   Lorin Picket A. Gerda Diss, M.D.

## 2010-11-25 NOTE — H&P (Signed)
NAME:  [name], [name]                          ACCOUNT NO.:  [id number]   MEDICAL RECORD NO.:  [id number]                   PATIENT TYPE:  AMB   LOCATION:  DAY                                  FACILITY:  [facility]   PHYSICIAN:  Tilda Burrow, M.D.              DATE OF BIRTH:  1961/01/12   DATE OF ADMISSION:  03/19/2003  DATE OF DISCHARGE:                                HISTORY & PHYSICAL   ADMISSION DIAGNOSES:  1. Recurrent menorrhagia.  2. Anemia secondary to heavy menses.  3. Right lower quadrant pain.   HISTORY OF PRESENT ILLNESS:  This 50 year old female, gravida 5, para 3, AB  2, is admitted at this time for a total abdominal hysterectomy and a  possible removal of the left tube and ovary.  [name] has been followed  through our office and is well-known to our practice.  In February 2003 she  had a right salpingo-oophorectomy due to chronic right lower quadrant pain  and right ovarian cyst.  She refused to consider hysterectomy at the time.  Since that time she has had lots of pain on the right side which has  persisted.  She has developed progression of her periods.  At this point in  time we have been attempting to control her heavy menses.  Her anemia to 9.8  has not been able to be controlled without medical therapy.  We have  discussed options including endometrial ablation and due to the right lower  quadrant pain and suspected adhesions, she is not inclined to use that  method.  Plans are to proceed with a hysterectomy and possible removal of  the residual left ovary.  Review of her records indicates that she has  already had removal of the right tube and ovary as well as a pedunculated  fibroid.  Pelvic adhesions are suspected.  Review of the operative report  from August 12, 2001, shows bilateral chronic hydrosalpinx, right greater  than left, with simple ovarian cyst that was densely adherent to the  sidewall and back of the broad ligament, and a small pedunculated  fibroid  that interfered with pelvic visualization.  Plans are for surgical removal  of the uterus and cervix at this time.  The patient is aware that if the  left ovary is removed, that she will remain without hormones at that time  and issues of hormone support will be required subsequently.  We have  considered endometrial ablation, but this has been discarded after  discussion further.   PAST MEDICAL HISTORY:  1. Borderline mental function.  2. History of gonorrhea and Chlamydia cultures in the past, currently     negative.  3. Rheumatic fever as a child.   PAST SURGICAL HISTORY:  Negative except for laparotomy and right salpingo-  oophorectomy and salpingectomy in August 08, 2001, with removal of  pedunculated fibroid.   HABITS:  Cigarettes, half-pack per day.  PHYSICAL EXAMINATION:  VITAL SIGNS:  Height 5 feet 8 inches, weight 266.  Blood pressure 130/70, pulse 76.  GENERAL:  A healthy-appearing, obese, cheerful, oriented African-American  female who appears here with her support person.  Questions have been  answered to her satisfaction.  HEENT:  Pupils equal, round, and reactive.  Extraocular movements intact.  NECK:  Supple.  CHEST:  Clear to auscultation.  ABDOMEN:  No rebound tenderness.  There is right lower quadrant tenderness  to palpation.  PELVIC:  Normal secretions.  GC and Chlamydia are negative.  Pap smear is  class I.  External genitalia normal for age.  EXTREMITIES:  Chronic lymphedema of several years' duration.   IMPRESSION:  1. Menorrhagia.  2. History of uterine fibroids.  3. History of pelvic infection.  4. History of right salpingo-oophorectomy and left salpingectomy for     bilateral hydrosalpinx and right ovarian cyst.   PLAN:  Abdominal hysterectomy, possible removal of left ovary.  Evaluation  of right lower quadrant for adhesions or residual ovarian tissue.                                               Tilda Burrow, M.D.     JVF/MEDQ  D:  03/17/2003  T:  03/17/2003  Job:  [id number]   cc:   Lorin Picket A. Gerda Diss, M.D.  [address], [address]  [place]  [place] [address]  Fax: [phone]

## 2010-11-25 NOTE — Discharge Summary (Signed)
NAMEJADIA, [name]                ACCOUNT NO.:  [id number]   MEDICAL RECORD NO.:  [id number]          PATIENT TYPE:  INP   LOCATION:  A318                          FACILITY:  [facility]   PHYSICIAN:  Scott A. Gerda Diss, MD    DATE OF BIRTH:  1960-09-16   DATE OF ADMISSION:  01/31/2006  DATE OF DISCHARGE:  07/26/2007LH                                 DISCHARGE SUMMARY   HOSPITAL COURSE:  This patient was admitted in with observation due to  hypoglycemia.  She had not been eating her meals on that day and been taking  her medications and it is apparent that this caused it to bottom out.  Her  sugars stayed up after being given a couple of different D50s and then being  fed and she was stable on the date of the 26th.  It was felt that the best  thing to do is for her to stick with her usual medicines and so therefore  Abilify 5 mg p.o. nightly, Zoloft 50 mg q.a.m., trazodone 50 mg nightly,  omeprazole 20 mg q.a.m., Prinivil 5 mg q.a.m., Lasix 40 mg q.a.m., potassium  10 mEq, and I would recommend this just be q.a.m., and Glucophage 500 mg one  b.i.d., stop Glynase.  She will be followed up by home health and to check  over her medicines to make sure there is what is written above plus also too  she will be followed up in our office in approximately couple weeks, sooner  if any particular problems.  Warnings discussed with the patient and her  case worker will be called.      Scott A. Gerda Diss, MD  Electronically Signed     SAL/MEDQ  D:  02/01/2006  T:  02/01/2006  Job:  [id number]   cc:   Home Health

## 2010-11-25 NOTE — Discharge Summary (Signed)
NAMESANTANNA, [name]                ACCOUNT NO.:  [id number]   MEDICAL RECORD NO.:  [id number]          PATIENT TYPE:  INP   LOCATION:  2901                         FACILITY:  [facility]   PHYSICIAN:  Pricilla Riffle, MD, FACCDATE OF BIRTH:  26-Oct-1960   DATE OF ADMISSION:  05/22/2008  DATE OF DISCHARGE:  05/23/2008                               DISCHARGE SUMMARY   DISCHARGE DIAGNOSES:  1. Atypical chest pain.  2. Hypokalemia.  3. Obstructive sleep apnea.  4. Hypertension.  5. Diabetes mellitus type 2.  6. Depressive disorder with history of suicidal ideation.  7. Mental retardation.  8. Constipation versus irritable bowel syndrome.  9. Chronic nausea and vomiting.  10.Gastroesophageal reflux disease.  11.Chronic back pain.  12.Migraines.  13.Chronic knee pain.  14.Status post hysterectomy.  15.Status post abdominal surgery - kidney surgery as a child.   DISCHARGE MEDICATIONS:  1. Potassium chloride 20 mEq p.o. b.i.d. for 3 days, then 10 mEq      daily.  2. Xanax 0.25 mg p.o. b.i.d.  3. Lasix 40 mg p.o. daily.  4. Lisinopril 5 mg p.o. daily.  5. Protonix 40 mg p.o. daily.  6. Trazodone 50 mg p.o. nightly.  7. Bentyl 10 mg p.o. t.i.d.  8. Glucophage 500 mg p.o. b.i.d.  9. Colace 200 mg p.o. daily.  10.Zoloft 15 mg p.o. daily.  11.Metanx p.o. daily.   DISPOSITION/FOLLOWUP:  The patient is to follow up with Dr. [name]  2 days after discharge.  Dr. Tenny Craw will call Dr. Gerda Diss and discuss  whether the patient may benefit from stress testing in the outpatient  setting.  The patient also needs to have her potassium evaluated at that  time.   CONSULTATIONS:  No consultations were obtained.   ADMISSION HISTORY AND PHYSICAL:  The patient is a 50 year old female  with past medical history of hypertension, diabetes, tobacco abuse, and  recent discharge from an inpatient psychiatric facility, who presented  to the emergency room complaining of chest pain.  The patient's pain  started 4 days prior to admission, and it was rated 10/10 for 4 days.  It was pleuritic in nature and worse with palpation of the chest.  It  was associated with nausea and vomiting which appears to be chronic.  She also notes increased swelling, but denies orthopnea, PND, or fever.  For full details of admission H and P, please refer to dictation by Dr.  Laural Benes.   HOSPITAL COURSE BY PROBLEM:  1. Atypical chest pain.  The patient was monitored in the step-down      unit on telemetry because she had ongoing chest pain.  Cardiac      enzymes were cycled x3 and were all negative.  Nitroglycerin did      not seem to help her pain, but morphine did help some.  It was felt      like her pain was likely not cardiac in etiology given the      reproducibility on exam and history.  However, because she does      have multiple risk factors to include hypertension,  diabetes, and      tobacco abuse, she was admitted for further evaluation.  D-dimer      was done and was negative, lipase was done and was within normal      limits, and sed rate was normal.  Lipids were drawn to risk      stratify and LDL was around goal for diabetic at 78 and HDL was      good at 51.  The patient also had a chest x-ray that did not show      any evidence of any abnormality.  The patient's EKGs showed some      nonspecific T-wave changes laterally with some mild T-wave      inversion or flattening in leads V4, V5, and V6 and given that she      has multiple risk factors, if her pain persists, may benefit from      stress testing in the outpatient setting.  2. Hypokalemia.  This was felt to be likely secondary to either her      history of nausea and vomiting or therapy with Lasix in the      outpatient setting.  This was repleted orally, and she will follow      up in the outpatient setting to have this tested.  She did not have      any episodes of vomiting during this hospitalization.  3. Obstructive sleep apnea.   The patient was provided CPAP during her      visit overnight.  4. Hypertension.  Her blood pressure was well controlled with      systolics in the 110s to 120s during this hospitalization.  5. Tobacco abuse.  The patient was provided brief counseling on      smoking cessation.   DISCHARGE LABS:  CK 220, CK-MB 3.8, relative index 1.7.  Troponin less  than 0.01.  White blood cell count 5.4, hemoglobin 13.0, MCV 94,  platelet count 154.  Lipid profile, triglycerides 90, HDL 51, LDL 78,  total cholesterol 147.   DISCHARGE VITAL SIGNS:  Temperature 98.1, blood pressure 124/66, heart  rate 75, respiratory rate 16, and O2 95% on room air.      Joaquin Courts, MD  Electronically Signed      Pricilla Riffle, MD, [facility]  Electronically Signed    VW/MEDQ  D:  06/02/2008  T:  06/02/2008  Job:  [id number]   cc:   Lorin Picket A. Gerda Diss, MD

## 2010-11-25 NOTE — H&P (Signed)
[name], [name]                ACCOUNT NO.:  [id number]   MEDICAL RECORD NO.:  [id number]          PATIENT TYPE:  AMB   LOCATION:                                FACILITY:  [facility]   PHYSICIAN:  Tilda Burrow, M.D. DATE OF BIRTH:  30-Aug-1960   DATE OF ADMISSION:  09/13/2006  DATE OF DISCHARGE:  LH                              HISTORY & PHYSICAL   ADMISSION DIAGNOSIS:  Rectocele.   HISTORY OF PRESENT ILLNESS:  This 50 year old female, limited mental  capacity, is admitted at this time for posterior repair.  The date of  surgery is March 6.  [name] has been seen in our office for some time  with complaints of marked posterior perineal laxity, which is very  impressive above a good anal sphincter tone.  This results in difficulty  in defecation and has to splint to defecate, takes up to an hour to  evacuate the bowel movement at times due to straining and sticking.  She  would benefit tremendously from a posterior repair, in my opinion.  She  denies stress incontinence.  She will be admitted September 12, 2005, for  correction of rectocele.  The rectocele is quite high in the posterior  vaginal wall.   PAST MEDICAL HISTORY:  Positive for hypertension, diabetes and  dyslipidemia.  She has some decreased mental capacity as well.  She is a  smoker, and she gives a verbal history of DVT several years ago.  She  had a hysterectomy in 2004 and then right salpingo-oophorectomy in 2003.  There was extensive adhesiolysis at the time of the hysterectomy in  2004.   SURGICAL HISTORY:  Abdominal hysterectomy with hernia repair, Tilda Burrow, M.D., with extensive lysis of adhesions, 2004, and with a  right ovarian cystectomy, salpingo-oophorectomy and left salpingectomy  and removal of pedunculated fibroid back in 2003.   MEDICATION LIST:  Will seek a copy from [facility]  for medicines.   ALLERGIES:  None.   PHYSICAL EXAMINATION:  GENERAL:  An overweight, oriented,  communicative  African-American female.  VITAL SIGNS:  Weight is 226, blood pressure 142/68.  HEENT:  Notable for headaches of the neck, shoulder and chest that hurt  intermittently.  The pupils are equal, round, and reactive.  NECK:  Supple.  CARDIOVASCULAR:  Unremarkable.  ABDOMEN:  Nontender.  GENITOURINARY:  External genitalia multiparous.  Uterus anterior.  Adnexa difficult to assess.   PLAN:  Posterior vaginal repair on September 13, 2006.      Tilda Burrow, M.D.  Electronically Signed     JVF/MEDQ  D:  09/04/2006  T:  09/05/2006  Job:  [id number]   cc:   Lorin Picket A. Gerda Diss, MD  Fax: [phone]

## 2011-01-25 ENCOUNTER — Telehealth: Payer: Self-pay | Admitting: Gastroenterology

## 2011-01-25 ENCOUNTER — Ambulatory Visit: Payer: Medicare Other | Admitting: Gastroenterology

## 2011-02-08 NOTE — Telephone Encounter (Signed)
Routed to Provider

## 2011-03-27 ENCOUNTER — Other Ambulatory Visit: Payer: Self-pay | Admitting: Family Medicine

## 2011-03-27 DIAGNOSIS — Z139 Encounter for screening, unspecified: Secondary | ICD-10-CM

## 2011-03-29 LAB — CBC
MCV: 90
Platelets: 173
RDW: 13.6
WBC: 5.1

## 2011-03-29 LAB — BASIC METABOLIC PANEL
BUN: 2 — ABNORMAL LOW
Chloride: 106
Creatinine, Ser: 0.61
Glucose, Bld: 101 — ABNORMAL HIGH

## 2011-03-29 LAB — DIFFERENTIAL
Eosinophils Absolute: 0.1
Lymphs Abs: 1.6
Neutro Abs: 2.7
Neutrophils Relative %: 53

## 2011-04-03 ENCOUNTER — Encounter (HOSPITAL_COMMUNITY): Payer: Self-pay | Admitting: Emergency Medicine

## 2011-04-03 ENCOUNTER — Emergency Department (HOSPITAL_COMMUNITY)
Admission: EM | Admit: 2011-04-03 | Discharge: 2011-04-04 | Disposition: A | Discharge status: Home / Self Care | Payer: PRIVATE HEALTH INSURANCE | Attending: Emergency Medicine | Admitting: Emergency Medicine

## 2011-04-03 DIAGNOSIS — I10 Essential (primary) hypertension: Secondary | ICD-10-CM | POA: Insufficient documentation

## 2011-04-03 DIAGNOSIS — H9209 Otalgia, unspecified ear: Secondary | ICD-10-CM | POA: Insufficient documentation

## 2011-04-03 DIAGNOSIS — K589 Irritable bowel syndrome without diarrhea: Secondary | ICD-10-CM | POA: Insufficient documentation

## 2011-04-03 DIAGNOSIS — R42 Dizziness and giddiness: Secondary | ICD-10-CM | POA: Insufficient documentation

## 2011-04-03 DIAGNOSIS — F17200 Nicotine dependence, unspecified, uncomplicated: Secondary | ICD-10-CM | POA: Insufficient documentation

## 2011-04-03 DIAGNOSIS — Z79899 Other long term (current) drug therapy: Secondary | ICD-10-CM | POA: Insufficient documentation

## 2011-04-03 DIAGNOSIS — F411 Generalized anxiety disorder: Secondary | ICD-10-CM | POA: Insufficient documentation

## 2011-04-03 DIAGNOSIS — Z888 Allergy status to other drugs, medicaments and biological substances status: Secondary | ICD-10-CM | POA: Insufficient documentation

## 2011-04-03 DIAGNOSIS — Z880 Allergy status to penicillin: Secondary | ICD-10-CM | POA: Insufficient documentation

## 2011-04-03 DIAGNOSIS — W19XXXA Unspecified fall, initial encounter: Secondary | ICD-10-CM | POA: Insufficient documentation

## 2011-04-03 DIAGNOSIS — F79 Unspecified intellectual disabilities: Secondary | ICD-10-CM | POA: Insufficient documentation

## 2011-04-03 DIAGNOSIS — E119 Type 2 diabetes mellitus without complications: Secondary | ICD-10-CM | POA: Insufficient documentation

## 2011-04-03 LAB — GLUCOSE, CAPILLARY: Glucose-Capillary: 75 mg/dL (ref 70–99)

## 2011-04-03 MED ORDER — TRAMADOL HCL 50 MG PO TABS
50.0000 mg | ORAL_TABLET | Freq: Once | ORAL | Status: AC
  Administered 2011-04-03: 50 mg via ORAL
  Filled 2011-04-03: qty 1

## 2011-04-03 MED ORDER — ERYTHROMYCIN BASE 250 MG PO TABS
500.0000 mg | ORAL_TABLET | Freq: Once | ORAL | Status: AC
  Administered 2011-04-03: 500 mg via ORAL
  Filled 2011-04-03: qty 2

## 2011-04-03 MED ORDER — KETOROLAC TROMETHAMINE 60 MG/2ML IM SOLN
60.0000 mg | Freq: Once | INTRAMUSCULAR | Status: AC
  Administered 2011-04-03: 60 mg via INTRAMUSCULAR
  Filled 2011-04-03: qty 2

## 2011-04-03 NOTE — ED Notes (Signed)
Patient c/o "sugar problems" and dizziness.  Patient c/o sharp, stabbing chest pain x 3 days.  States has been feeling bad x 3 weeks.

## 2011-04-03 NOTE — ED Provider Notes (Addendum)
History     CSN: [id number] Arrival date & time: 04/03/2011  9:27 PM  Chief Complaint  Patient presents with  . Dizziness  . Otalgia  . Fall    HPI  (Consider location/radiation/quality/duration/timing/severity/associated sxs/prior treatment)  Patient is a 50 y.o. female presenting with ear pain and fall. The history is provided by the patient.  Otalgia Pertinent negatives include no headaches, no abdominal pain, no neck pain, no cough and no rash.  Fall Pertinent negatives include no fever, no abdominal pain and no headaches.   [name] is a 50 year old female with a history of mental retardation, diabetes, hypertension, irritable bowel syndrome, and anxiety.  She says her sick sugar was high today, however, she does not know how high.  She says she was dizzy, and she fell onto her right knee.  However, she is still ambulatory.  She denies nausea, vomiting, fevers, chills, cough, shortness of breath or urinary tract symptoms.  She states that she has a purulent discharge in her mouth.  She has upper and lower dentures.  She denies pain anywhere else.  Past Medical History  Diagnosis Date  . Mental retardation   . Hyperglycemia   . HTN (hypertension)   . Migraines   . Diabetes mellitus   . Anxiety   . IBS (irritable bowel syndrome)     Past Surgical History  Procedure Date  . Abdominal hysterectomy   . Foot surgery   . External ear surgery     bilateral  . Glaucoma surgery   . Colonoscopy 11/2007    hyperplastic polyps, prior hx of adenomas   . Small bowel capsule 10/2007    normal  . Esophagogastroduodenoscopy 08/2007    moderate sized hiatal hernia  . Colonoscopy 05/2010    incomplete due to poor prep, hyperplastic rectal polyp  . Esophagogastroduodenoscopy 05/2010    noncritical schatzki ring s/p 82F    Family History  Problem Relation Age of Onset  . Colon cancer      aunt  . Liver disease Neg Hx   . Inflammatory bowel disease Neg Hx   . Stroke Mother   .  Heart attack Father     History  Substance Use Topics  . Smoking status: Current Everyday Smoker -- 1.0 packs/day for 40 years    Types: Cigarettes  . Smokeless tobacco: Never Used  . Alcohol Use: No     social drinker    OB History    Grav Para Term Preterm Abortions TAB SAB Ect Mult Living                  Review of Systems  Review of Systems  Constitutional: Negative for fever and chills.  HENT: Negative for ear pain, congestion and neck pain.   Eyes: Negative for redness.  Respiratory: Negative for cough.   Cardiovascular: Negative for chest pain.  Gastrointestinal: Negative for abdominal pain.  Genitourinary: Negative for dysuria.  Musculoskeletal: Negative for back pain.  Skin: Negative for color change and rash.  Neurological: Negative for headaches.  Psychiatric/Behavioral: Negative for confusion.    Allergies  Acetaminophen; Aspirin; Aspirin-acetaminophen-caffeine; Nsaids; Penicillins; and Thorazine  Home Medications   Current Outpatient Rx  Name Route Sig Dispense Refill  . ALPRAZOLAM 0.25 MG PO TABS Oral Take 0.25 mg by mouth 2 (two) times daily as needed. For anxiety    . ATOMOXETINE HCL 100 MG PO CAPS Oral Take 100 mg by mouth daily.      Marland Kitchen CALCIUM CARBONATE-VITAMIN D  500-200 MG-UNIT PO TABS Oral Take 1 tablet by mouth 3 (three) times daily.      Marland Kitchen DEXILANT 60 MG PO CPDR  TAKE ONE CAPSULE BY MOUTHDAILY. 30 capsule 11  . LUBIPROSTONE 24 MCG PO CAPS       . FUROSEMIDE 40 MG PO TABS Oral Take 80 mg by mouth daily.      Marland Kitchen HYDROXYZINE HCL 50 MG PO TABS Oral Take 50 mg by mouth daily.      Marland Kitchen LAMOTRIGINE 100 MG PO TABS Oral Take 100 mg by mouth daily.      Marland Kitchen LISINOPRIL 5 MG PO TABS Oral Take 5 mg by mouth daily.      Marland Kitchen MIRTAZAPINE 30 MG PO TABS Oral Take 30 mg by mouth at bedtime.      Marland Kitchen NABUMETONE 500 MG PO TABS Oral Take 500 mg by mouth 2 (two) times daily.      Marland Kitchen PALIPERIDONE 9 MG PO TB24 Oral Take 9 mg by mouth every morning.      Marland Kitchen POTASSIUM CHLORIDE 10  MEQ PO TBCR Oral Take 10 mEq by mouth daily.      . SERTRALINE HCL 100 MG PO TABS Oral Take 200 mg by mouth daily.      . TRAZODONE HCL 50 MG PO TABS Oral Take 50 mg by mouth at bedtime.        Physical Exam    BP 126/82  Pulse 94  Temp(Src) 98.9 F (37.2 C) (Oral)  Resp 20  Ht 5\' 10"  (1.778 m)  Wt 300 lb (136.079 kg)  BMI 43.05 kg/m2  SpO2 96%  Physical Exam  Constitutional: She is oriented to person, place, and time. She appears well-developed and well-nourished.  HENT:  Head: Normocephalic and atraumatic.  Eyes: Pupils are equal, round, and reactive to light.  Neck: Normal range of motion.  Cardiovascular: Normal rate.   Pulmonary/Chest: Effort normal. No respiratory distress. She has no wheezes. She has no rales.  Abdominal: She exhibits no distension.  Musculoskeletal: She exhibits tenderness. She exhibits no edema.       Mild right knee tenderness.  There is no ecchymoses, edema.  She has a mild antalgic gait  Neurological: She is alert and oriented to person, place, and time.  Skin: Skin is warm and dry. No rash noted. No erythema. No pallor.  Psychiatric: She has a normal mood and affect.    ED Course  Procedures (including critical care time)   Labs Reviewed  GLUCOSE, CAPILLARY   No results found.   No diagnosis found.   MDM Fall There is no evidence of a fracture or dislocation.  There is no edema, or ecchymoses.  The patient is in no distress.  There is no indication for laboratory testing or radiographs at this time.        Nicholes Stairs, MD 04/03/11 [id number]  Nicholes Stairs, MD 04/03/11 [phone]

## 2011-04-03 NOTE — ED Notes (Signed)
Swelling right lower jaw  

## 2011-04-04 NOTE — ED Notes (Signed)
Pt left er stating no needs 

## 2011-04-06 LAB — URINALYSIS, ROUTINE W REFLEX MICROSCOPIC
Leukocytes, UA: NEGATIVE
Nitrite: NEGATIVE
Protein, ur: NEGATIVE
pH: 5

## 2011-04-06 LAB — CBC
HCT: 33.2 — ABNORMAL LOW
MCHC: 34.8
MCV: 92.2
Platelets: 165
RDW: 14.1

## 2011-04-06 LAB — DIFFERENTIAL
Basophils Absolute: 0
Basophils Relative: 1
Eosinophils Absolute: 0.1
Eosinophils Relative: 1

## 2011-04-06 LAB — BASIC METABOLIC PANEL
BUN: 9
Chloride: 105
Glucose, Bld: 99
Potassium: 3.6

## 2011-04-06 LAB — URINE MICROSCOPIC-ADD ON

## 2011-04-10 LAB — RAPID URINE DRUG SCREEN, HOSP PERFORMED
Amphetamines: NOT DETECTED
Barbiturates: NOT DETECTED
Benzodiazepines: NOT DETECTED
Cocaine: NOT DETECTED
Opiates: NOT DETECTED
Tetrahydrocannabinol: NOT DETECTED

## 2011-04-10 LAB — URINALYSIS, ROUTINE W REFLEX MICROSCOPIC
Bilirubin Urine: NEGATIVE
Glucose, UA: NEGATIVE
Ketones, ur: NEGATIVE
Leukocytes, UA: NEGATIVE
Nitrite: NEGATIVE
Protein, ur: NEGATIVE
Specific Gravity, Urine: <1.005 — ABNORMAL LOW
Urobilinogen, UA: 0.2
pH: 5

## 2011-04-10 LAB — URINE MICROSCOPIC-ADD ON

## 2011-04-10 LAB — DIFFERENTIAL
Basophils Absolute: 0
Basophils Relative: 0
Eosinophils Absolute: 0.1
Eosinophils Relative: 2
Lymphocytes Relative: 45
Lymphs Abs: 2.7
Monocytes Absolute: 0.5
Monocytes Relative: 9
Neutro Abs: 2.6
Neutrophils Relative %: 43

## 2011-04-10 LAB — BASIC METABOLIC PANEL
BUN: 5 — ABNORMAL LOW
CO2: 29
Calcium: 9.3
Chloride: 102
Creatinine, Ser: 0.54
GFR calc Af Amer: >60
GFR calc non Af Amer: >60
Glucose, Bld: 127 — ABNORMAL HIGH
Potassium: 3.3 — ABNORMAL LOW
Sodium: 141

## 2011-04-10 LAB — CBC
HCT: 36.6
Hemoglobin: 12.6
MCHC: 34.5
MCV: 92.3
Platelets: 161
RBC: 3.96
RDW: 13.3
WBC: 5.9

## 2011-04-10 LAB — ETHANOL: Alcohol, Ethyl (B): <5

## 2011-04-11 LAB — POCT CARDIAC MARKERS: CKMB, poc: 4.1

## 2011-04-11 LAB — CBC
Hemoglobin: 13.4
MCHC: 34.3
MCHC: 34.2
Platelets: 178
RBC: 4.05
RDW: 13.6
RDW: 13.6

## 2011-04-11 LAB — BASIC METABOLIC PANEL
BUN: 6
CO2: 32
CO2: 31
Calcium: 8.9
Calcium: 8.9
Creatinine, Ser: 0.58
Creatinine, Ser: 0.56
GFR calc Af Amer: >60
GFR calc non Af Amer: >60
GFR calc non Af Amer: >60
Glucose, Bld: 99
Sodium: 137

## 2011-04-11 LAB — TROPONIN I
Troponin I: 0.01
Troponin I: <0.01

## 2011-04-11 LAB — CK TOTAL AND CKMB (NOT AT ARMC)
CK, MB: 5.5 — ABNORMAL HIGH
Relative Index: 1.9
Relative Index: 1.9
Total CK: 220 — ABNORMAL HIGH

## 2011-04-11 LAB — GLUCOSE, CAPILLARY
Glucose-Capillary: 103 — ABNORMAL HIGH
Glucose-Capillary: 107 — ABNORMAL HIGH
Glucose-Capillary: 120 — ABNORMAL HIGH
Glucose-Capillary: 98
Glucose-Capillary: 82

## 2011-04-11 LAB — DIFFERENTIAL
Basophils Absolute: 0
Basophils Relative: 0
Monocytes Absolute: 0.5
Neutro Abs: 2.1

## 2011-04-11 LAB — D-DIMER, QUANTITATIVE: D-Dimer, Quant: 0.37

## 2011-04-11 LAB — LIPASE, BLOOD: Lipase: 44

## 2011-04-11 LAB — LIPID PANEL
Cholesterol: 147
LDL Cholesterol: 78
Total CHOL/HDL Ratio: 2.9

## 2011-04-11 LAB — MAGNESIUM: Magnesium: 1.9

## 2011-04-17 LAB — I-STAT 8, (EC8 V) (CONVERTED LAB)
BUN: 24 — ABNORMAL HIGH
Bicarbonate: 27.1 — ABNORMAL HIGH
Glucose, Bld: 92
pCO2, Ven: 47.8

## 2011-04-17 LAB — POCT I-STAT CREATININE: Creatinine, Ser: 1.4

## 2011-04-18 LAB — URINE MICROSCOPIC-ADD ON

## 2011-04-18 LAB — BASIC METABOLIC PANEL
CO2: 30
Calcium: 8.9
Glucose, Bld: 96
Sodium: 138

## 2011-04-18 LAB — URINALYSIS, ROUTINE W REFLEX MICROSCOPIC
Nitrite: NEGATIVE
Specific Gravity, Urine: 1.025
pH: 5.5

## 2011-04-18 LAB — DIFFERENTIAL
Basophils Absolute: 0.1
Basophils Relative: 1
Eosinophils Absolute: 0.1 — ABNORMAL LOW
Eosinophils Relative: 1
Monocytes Absolute: 0.5
Neutro Abs: 1.8

## 2011-04-18 LAB — CBC
Hemoglobin: 12.4
MCHC: 33.6
MCV: 91.2
RDW: 13.6

## 2011-04-27 ENCOUNTER — Ambulatory Visit (INDEPENDENT_AMBULATORY_CARE_PROVIDER_SITE_OTHER): Payer: Medicare Other | Admitting: Gastroenterology

## 2011-04-27 ENCOUNTER — Encounter: Payer: Self-pay | Admitting: Gastroenterology

## 2011-04-27 DIAGNOSIS — K5900 Constipation, unspecified: Secondary | ICD-10-CM

## 2011-04-27 MED ORDER — POLYETHYLENE GLYCOL 3350 17 GM/SCOOP PO POWD: 17.0000 g | Freq: Every day | ORAL | Status: AC

## 2011-04-27 MED ORDER — PEG-KCL-NACL-NASULF-NA ASC-C 100 G PO SOLR: 1.0000 | Freq: Once | ORAL | Status: DC

## 2011-04-27 NOTE — Patient Instructions (Signed)
Follow a high fiber diet. Try to limit fatty foods, soul foods.  Do not take Amitiza today or tomorrow. Start back on Saturday taking it twice a day. When you go home, I want you to take the bowel cleansing as directed here: Drink lots of clear liquids-like gatorade, sprite, water  Empty 1 pouch A & B into disposable container  Add water to the top line, mix & dissolve  Every 15 minutes, drink down to the next line until complete  BE SURE TO DRINK 16 ounces of your favorite CLEAR LIQUID    You may repeat this a second time if needed.   On Saturday, start taking Amitiza twice a day, WITH FOOD, as well as Miralax any day that you need to.   We will see you back in 3 months.

## 2011-04-27 NOTE — Progress Notes (Signed)
Cc to PCP 

## 2011-04-27 NOTE — Progress Notes (Signed)
Referring Provider: [name], MD Primary Care Physician:  [name], MD, MD Primary Gastroenterologist: Dr. Jena Gauss   Chief Complaint  Patient presents with  . Constipation    HPI:   [name] returns today in f/u for chronic constipation. Last seen in May 2012. Amitiza increased to BID at that time, instructed to take Miralax. Pt has been out of Miralax for about 2 mos. Now complaining of no BM in past 2-3 weeks. Prior to running out was having a BM about every day. Eating high fat foods, "soul foods" because it tasted good. Reports abdominal bloating, abdominal discomfort.   Up 10 lbs from May.   Past Medical History  Diagnosis Date  . Mental retardation   . Hyperglycemia   . HTN (hypertension)   . Migraines   . Diabetes mellitus   . Anxiety   . IBS (irritable bowel syndrome)     Past Surgical History  Procedure Date  . Abdominal hysterectomy   . Foot surgery   . External ear surgery     bilateral  . Glaucoma surgery   . Colonoscopy 11/2007    hyperplastic polyps, prior hx of adenomas   . Small bowel capsule 10/2007    normal  . Esophagogastroduodenoscopy 08/2007    moderate sized hiatal hernia  . Colonoscopy 05/2010    incomplete due to poor prep, hyperplastic rectal polyp  . Esophagogastroduodenoscopy 05/2010    noncritical schatzki ring s/p 78F    Current Outpatient Prescriptions  Medication Sig Dispense Refill  . ALPRAZolam (XANAX) 0.25 MG tablet Take 0.25 mg by mouth 2 (two) times daily as needed. For anxiety      . atomoxetine (STRATTERA) 100 MG capsule Take 100 mg by mouth daily.        . calcium-vitamin D (OSCAL WITH D) 500-200 MG-UNIT per tablet Take 1 tablet by mouth 3 (three) times daily.        Marland Kitchen DEXILANT 60 MG capsule TAKE ONE CAPSULE BY MOUTHDAILY.  30 capsule  11  . furosemide (LASIX) 40 MG tablet Take 80 mg by mouth daily.        . hydrOXYzine (ATARAX) 50 MG tablet Take 50 mg by mouth daily.        Marland Kitchen lamoTRIgine (LAMICTAL) 100 MG tablet Take  100 mg by mouth daily.        Marland Kitchen lisinopril (PRINIVIL,ZESTRIL) 5 MG tablet Take 5 mg by mouth daily.        Marland Kitchen lubiprostone (AMITIZA) 24 MCG capsule        . mirtazapine (REMERON) 30 MG tablet Take 30 mg by mouth at bedtime.        . nabumetone (RELAFEN) 500 MG tablet Take 500 mg by mouth 2 (two) times daily.        . paliperidone (INVEGA) 9 MG 24 hr tablet Take 9 mg by mouth every morning.        . potassium chloride (KLOR-CON) 10 MEQ CR tablet Take 10 mEq by mouth daily.        . sertraline (ZOLOFT) 100 MG tablet Take 200 mg by mouth daily.        . traZODone (DESYREL) 50 MG tablet Take 50 mg by mouth at bedtime.        . peg 3350 powder (MOVIPREP) 100 G SOLR Take 1 kit (100 g total) by mouth once. As directed Please purchase 1 Fleets enema to use with the prep  1 kit  0  . polyethylene glycol powder (GLYCOLAX/MIRALAX) powder  Take 17 g by mouth daily. Take 1 capful daily for a bowel movement.  255 g  11    Allergies as of 04/27/2011 - Review Complete 04/27/2011  Allergen Reaction Noted  . Acetaminophen    . Aspirin    . Aspirin-acetaminophen-caffeine    . Nsaids    . Penicillins    . Thorazine (chlorpromazine hcl)  10/20/2010    Family History  Problem Relation Age of Onset  . Colon cancer      aunt  . Liver disease Neg Hx   . Inflammatory bowel disease Neg Hx   . Stroke Mother   . Heart attack Father     History   Social History  . Marital Status: Divorced    Spouse Name: N/A    Number of Children: N/A  . Years of Education: N/A   Occupational History  . disabled    Social History Main Topics  . Smoking status: Current Everyday Smoker -- 1.0 packs/day for 40 years    Types: Cigarettes  . Smokeless tobacco: Never Used  . Alcohol Use: No     social drinker  . Drug Use: No  . Sexually Active: Yes -- Female partner(s)    Birth Control/ Protection: None     boyfriend   Other Topics Concern  . None   Social History Narrative  . None    Review of Systems: Gen:  Denies fever, chills, anorexia. Denies fatigue, weakness, weight loss.  CV: Denies chest pain, palpitations, syncope, peripheral edema, and claudication. Resp: Denies dyspnea at rest, cough, wheezing, coughing up blood, and pleurisy. GI: Denies vomiting blood, jaundice, and fecal incontinence.   Denies dysphagia or odynophagia. Derm: Denies rash, itching, dry skin Psych: Denies depression, anxiety, memory loss, confusion. No homicidal or suicidal ideation.  Heme: Denies bruising, bleeding, and enlarged lymph nodes.  Physical Exam: BP 135/80  Pulse 107  Temp(Src) 97.3 F (36.3 C) (Temporal)  Ht 5\' 10"  (1.778 m)  Wt 251 lb 12.8 oz (114.216 kg)  BMI 36.13 kg/m2 General:   Alert and oriented. No distress noted. Pleasant and cooperative.  Head:  Normocephalic and atraumatic. Eyes:  Conjuctiva clear without scleral icterus. Mouth:  Oral mucosa pink and moist.  Heart:  S1, S2 present without murmurs, rubs, or gallops. Regular rate and rhythm. Abdomen:  +BS, soft, obese, mildly distended but non-tender, non-tense. No rebound or guarding. No HSM or masses noted. Msk:  Symmetrical without gross deformities. Normal posture. Extremities:  Without edema. Neurologic:  Alert and  oriented x4;  grossly normal neurologically. Skin:  Intact without significant lesions or rashes. Psych:  Alert and cooperative. Normal mood and affect.

## 2011-04-27 NOTE — Assessment & Plan Note (Signed)
50 year old female with chronic constipation, had been doing well with combo of Amitiza BID and Miralax. However, ran out of Miralax 2 mos ago but did not tell our office. Diet high in fat and lack of adequate bowel regimen the culprit for significant constipation. As she has not had a BM in several weeks, we will do a bowel prep for purging. I am not concerned for bowel obstruction at this time. Abd soft, discomfort likely due to constipation.  Moviprep X 1 this evening.  Hold Amitiza until Sat, then resume Hold Miralax till Sat, then resume F/U in 3 mos or sooner if needed Miralax prescription sent to pharmacy to hopefully facilitate pt compliance HIGH FIBER DIET HANDOUT. Avoid fatty foods/soul food, etc.

## 2011-05-29 ENCOUNTER — Ambulatory Visit (HOSPITAL_COMMUNITY): Payer: PRIVATE HEALTH INSURANCE

## 2011-06-09 ENCOUNTER — Ambulatory Visit (HOSPITAL_COMMUNITY)
Admission: RE | Admit: 2011-06-09 | Discharge: 2011-06-09 | Disposition: A | Discharge status: Home / Self Care | Payer: PRIVATE HEALTH INSURANCE | Source: Ambulatory Visit | Attending: Family Medicine | Admitting: Family Medicine

## 2011-06-09 DIAGNOSIS — Z139 Encounter for screening, unspecified: Secondary | ICD-10-CM

## 2011-06-09 DIAGNOSIS — Z1231 Encounter for screening mammogram for malignant neoplasm of breast: Secondary | ICD-10-CM | POA: Insufficient documentation

## 2011-06-19 ENCOUNTER — Other Ambulatory Visit: Payer: Self-pay | Admitting: Family Medicine

## 2011-06-19 DIAGNOSIS — R928 Other abnormal and inconclusive findings on diagnostic imaging of breast: Secondary | ICD-10-CM

## 2011-06-28 ENCOUNTER — Ambulatory Visit (HOSPITAL_COMMUNITY)
Admission: RE | Admit: 2011-06-28 | Discharge: 2011-06-28 | Disposition: A | Discharge status: Home / Self Care | Payer: PRIVATE HEALTH INSURANCE | Source: Ambulatory Visit | Attending: Family Medicine | Admitting: Family Medicine

## 2011-06-28 ENCOUNTER — Other Ambulatory Visit (HOSPITAL_COMMUNITY): Payer: Self-pay | Admitting: Family Medicine

## 2011-06-28 DIAGNOSIS — R928 Other abnormal and inconclusive findings on diagnostic imaging of breast: Secondary | ICD-10-CM

## 2011-07-27 ENCOUNTER — Ambulatory Visit: Payer: Medicare Other | Admitting: Gastroenterology

## 2011-07-27 ENCOUNTER — Telehealth: Payer: Self-pay | Admitting: Gastroenterology

## 2011-07-27 NOTE — Telephone Encounter (Signed)
Pt was a no show

## 2011-08-24 ENCOUNTER — Ambulatory Visit (INDEPENDENT_AMBULATORY_CARE_PROVIDER_SITE_OTHER): Payer: PRIVATE HEALTH INSURANCE | Admitting: Gastroenterology

## 2011-08-24 ENCOUNTER — Encounter: Payer: Self-pay | Admitting: Gastroenterology

## 2011-08-24 DIAGNOSIS — K760 Fatty (change of) liver, not elsewhere classified: Secondary | ICD-10-CM

## 2011-08-24 DIAGNOSIS — Z86010 Personal history of colonic polyps: Secondary | ICD-10-CM

## 2011-08-24 DIAGNOSIS — K5900 Constipation, unspecified: Secondary | ICD-10-CM

## 2011-08-24 DIAGNOSIS — K7689 Other specified diseases of liver: Secondary | ICD-10-CM

## 2011-08-24 DIAGNOSIS — K219 Gastro-esophageal reflux disease without esophagitis: Secondary | ICD-10-CM

## 2011-08-24 MED ORDER — ESOMEPRAZOLE MAGNESIUM 40 MG PO CPDR: 40.0000 mg | DELAYED_RELEASE_CAPSULE | Freq: Two times a day (BID) | ORAL | Status: DC

## 2011-08-24 NOTE — Progress Notes (Signed)
Referring Provider: [name], MD Primary Care Physician:  [name], MD, MD Primary Gastroenterologist: Dr. Jena Gauss   Chief Complaint  Patient presents with  . Follow-up  . Gastrophageal Reflux    HPI:   Ms. Barren presents today in f/u for chronic constipation. Last seen Oct 2012. Prior medication regimen consisted of Amitiza 24 mcg BID with Miralax prn. She had been somewhat non-compliant with this and ran out prior to last appointment. Presented with constipation and given a bowel prep for purging purposes. Was eating diet high in fat.   Returns today with weight gain of 7 lbs since Oct 2012, up 17 total from May 2012. Still eating fatty foods, sodas, tea. Enjoys sausage, bacon, eggs. Daily BM now, not needing Miralax. Intermittent lower abdominal discomfort prior to BM. No rectal bleeding.   Notes worsening of reflux over past few months. States Dexilant not helping now over past few months. +nocturnal reflux. Vague dysphagia with chicken, sausage, bacon for past 3-4 weeks. Hx of non-critical Schatzki's ring s/p dilation in 2011.   Last TCS with poor prep in Nov 2011. Has hx of adenomas in past. Will need to discuss timing of next colonoscopy with RMR>   Past Medical History  Diagnosis Date  . Mental retardation   . Hyperglycemia   . HTN (hypertension)   . Migraines   . Diabetes mellitus   . Anxiety   . IBS (irritable bowel syndrome)     Past Surgical History  Procedure Date  . Abdominal hysterectomy   . Foot surgery   . External ear surgery     bilateral  . Glaucoma surgery   . Colonoscopy 11/2007    hyperplastic polyps, prior hx of adenomas   . Small bowel capsule 10/2007    normal  . Esophagogastroduodenoscopy 08/2007    moderate sized hiatal hernia  . Colonoscopy 05/2010    incomplete due to poor prep, hyperplastic rectal polyp  . Esophagogastroduodenoscopy 05/2010    noncritical schatzki ring s/p 50F    Current Outpatient Prescriptions  Medication Sig  Dispense Refill  . ALPRAZolam (XANAX) 0.25 MG tablet Take 0.25 mg by mouth 2 (two) times daily as needed. For anxiety      . calcium-vitamin D (OSCAL WITH D) 500-200 MG-UNIT per tablet Take 1 tablet by mouth 3 (three) times daily.        . furosemide (LASIX) 40 MG tablet Take 80 mg by mouth daily.        Marland Kitchen lamoTRIgine (LAMICTAL) 100 MG tablet Take 100 mg by mouth daily.        Marland Kitchen lisinopril (PRINIVIL,ZESTRIL) 5 MG tablet Take 5 mg by mouth daily.        Marland Kitchen lubiprostone (AMITIZA) 24 MCG capsule        . mirtazapine (REMERON) 30 MG tablet Take 30 mg by mouth at bedtime.        . nabumetone (RELAFEN) 500 MG tablet Take 500 mg by mouth 2 (two) times daily.        . paliperidone (INVEGA) 9 MG 24 hr tablet Take 9 mg by mouth every morning.        . potassium chloride (KLOR-CON) 10 MEQ CR tablet Take 10 mEq by mouth daily.        . sertraline (ZOLOFT) 100 MG tablet Take 200 mg by mouth daily.        Marland Kitchen atomoxetine (STRATTERA) 100 MG capsule Take 100 mg by mouth daily.        Marland Kitchen esomeprazole (  NEXIUM) 40 MG capsule Take 1 capsule (40 mg total) by mouth 2 (two) times daily. 30 minutes before breakfast and 30 minutes before dinner  60 capsule  3  . hydrOXYzine (ATARAX) 50 MG tablet Take 50 mg by mouth daily.          Allergies as of 08/24/2011 - Review Complete 08/24/2011  Allergen Reaction Noted  . Acetaminophen    . Aspirin    . Aspirin-acetaminophen-caffeine    . Nsaids    . Penicillins    . Thorazine (chlorpromazine hcl)  10/20/2010    Family History  Problem Relation Age of Onset  . Colon cancer      aunt  . Liver disease Neg Hx   . Inflammatory bowel disease Neg Hx   . Stroke Mother   . Heart attack Father     History   Social History  . Marital Status: Divorced    Spouse Name: N/A    Number of Children: N/A  . Years of Education: N/A   Occupational History  . disabled    Social History Main Topics  . Smoking status: Current Everyday Smoker -- 1.0 packs/day for 40 years     Types: Cigarettes  . Smokeless tobacco: Never Used  . Alcohol Use: No     social drinker  . Drug Use: No  . Sexually Active: Yes -- Female partner(s)    Birth Control/ Protection: None     boyfriend   Other Topics Concern  . None   Social History Narrative  . None    Review of Systems: Gen: Denies fever, chills, anorexia. Denies fatigue, weakness, weight loss.  CV: Denies chest pain, palpitations, syncope, peripheral edema, and claudication. Resp: Denies dyspnea at rest, cough, wheezing, coughing up blood, and pleurisy. GI: SEE HPI Derm: Denies rash, itching, dry skin Psych: Denies depression, anxiety, memory loss, confusion. No homicidal or suicidal ideation.  Heme: Denies bruising, bleeding, and enlarged lymph nodes.  Physical Exam: BP 139/84  Pulse 97  Temp(Src) 98.3 F (36.8 C) (Temporal)  Ht 5\' 10"  (1.778 m)  Wt 258 lb 6.4 oz (117.209 kg)  BMI 37.08 kg/m2 General:   Alert and oriented. No distress noted. Pleasant and cooperative.  Head:  Normocephalic and atraumatic. Eyes:  Conjuctiva clear without scleral icterus. Mouth:  Oral mucosa pink and moist. Good dentition. No lesions. Neck:  Supple, without mass or thyromegaly. Heart:  S1, S2 present without murmurs, rubs, or gallops. Regular rate and rhythm. Abdomen:  +BS, soft, obese, non-tender and non-distended. No rebound or guarding. No HSM or masses noted. Msk:  Symmetrical without gross deformities. Normal posture. Extremities:  Trace, non-pitting edema bilaterally Neurologic:  Alert and  oriented x4;  grossly normal neurologically. Skin:  Intact without significant lesions or rashes. Cervical Nodes:  No significant cervical adenopathy. Psych:  Alert and cooperative. Normal mood and affect.

## 2011-08-24 NOTE — Assessment & Plan Note (Signed)
Last full TCS in 2009. Hx of adenomatous polyps in remote past. Poor prep Nov 2011. Likely will need this year or at latest next year. Will discuss with RMR.

## 2011-08-24 NOTE — Patient Instructions (Signed)
Please review the reflux diet. It is VERY IMPORTANT that you follow this to help control your symptoms.   I know it is hard, but weight loss is very important as well. Please review the low-fat diet. Do not eat greasy, fatty, fried foods. Stay away from sodas and sugary drinks like tea, koolaid, juices. Walk most days of the week for exercise.  Stop Dexilant for now. Start taking Nexium once in the morning and once in the evening, on an empty stomach before your meals.   Keep taking Amitiza twice a day with food.  We will see you back in 6 weeks to see how you are doing.

## 2011-08-24 NOTE — Assessment & Plan Note (Signed)
Upon further review of chart, appears hx of fatty liver ([place] in 2007 with specific mention). No recent LFTs. I'm concerned about her continued wt gain in light of this hx.   Repeat LFTs in 6 weeks when she returns.

## 2011-08-24 NOTE — Assessment & Plan Note (Signed)
Worsening over past several months, +nocturnal reflux, associated intermittent vague dysphagia. Likely culprit is increasing wt, poor dietary choices. States Dexilant doesn't help anymore. Switch to Nexium 40 mg BID for now. Return in 6 weeks for f/u. If no improvement, needs EGD. Has hx of Schatzki's ring, last dilation Nov 2011. No other warning flags currently. Pt counseled extensively on low-fat diet, weight loss, reflux diet.

## 2011-08-24 NOTE — Assessment & Plan Note (Signed)
51 year old female with chronic constipation, doing well now on Amitiza 24 mcg BID. No need for prn Miralax currently. Diet choices are poor, with continued weight gain since last May, now at 17 lbs. Last TCS in Nov 2011 but poor prep. She has a hx of adenomatous polyps. Will need to discuss with Dr. Jena Gauss timing of next colonoscopy. Anticipate possibly this year or 2014 at latest.   Continue Amitiza BID. If concerns of compliance, switch to Linzess daily Return in 6 weeks (GERD f/u) Low-fat diet provided

## 2011-08-28 NOTE — Progress Notes (Signed)
Faxed to PCP

## 2011-10-04 ENCOUNTER — Encounter: Payer: Self-pay | Admitting: Internal Medicine

## 2011-10-05 ENCOUNTER — Encounter: Payer: Self-pay | Admitting: Gastroenterology

## 2011-10-05 ENCOUNTER — Ambulatory Visit (INDEPENDENT_AMBULATORY_CARE_PROVIDER_SITE_OTHER): Payer: PRIVATE HEALTH INSURANCE | Admitting: Gastroenterology

## 2011-10-05 ENCOUNTER — Telehealth: Payer: Self-pay | Admitting: Gastroenterology

## 2011-10-05 DIAGNOSIS — R109 Unspecified abdominal pain: Secondary | ICD-10-CM

## 2011-10-05 DIAGNOSIS — K7689 Other specified diseases of liver: Secondary | ICD-10-CM

## 2011-10-05 DIAGNOSIS — Z86010 Personal history of colonic polyps: Secondary | ICD-10-CM

## 2011-10-05 DIAGNOSIS — K5900 Constipation, unspecified: Secondary | ICD-10-CM

## 2011-10-05 DIAGNOSIS — K760 Fatty (change of) liver, not elsewhere classified: Secondary | ICD-10-CM

## 2011-10-05 DIAGNOSIS — R1319 Other dysphagia: Secondary | ICD-10-CM

## 2011-10-05 DIAGNOSIS — K219 Gastro-esophageal reflux disease without esophagitis: Secondary | ICD-10-CM

## 2011-10-05 NOTE — Progress Notes (Signed)
Referring Provider: Babs Sciara, MD Primary Care Physician:  [name], MD, MD Primary Gastroenterologist: Dr. Jena Gauss   Chief Complaint  Patient presents with  . Follow-up    HPI:   [name] presents today in follow-up for chronic constipation, reflux, vague dysphagia, hx of non-critical Schatzki's ring s/p dilation in 2011. Due to hx of mild mental retardation, somewhat difficult to obtain clear history from her at times. However, she reports taking Amitiza 24 mcg BID. Hx of poor diet choices. Wt has continued to climb with total of 22 lbs since last May.  Reports 8-10 loose stools per day since Feb. NO rectal bleeding. +periumbilical pain, constant, worse right before loose stools. Feels like a "hard pain", not a cramp.  Switched to Nexium BID since last visit, has helped with reflux. Reports following GERD diet but continued difficulty with dysphagia, reports "getting strangled".   She denies any recent sick contacts. Some question of fever although not documented.   Past Medical History  Diagnosis Date  . Mental retardation   . Hyperglycemia   . HTN (hypertension)   . Migraines   . Diabetes mellitus   . Anxiety   . IBS (irritable bowel syndrome)     Past Surgical History  Procedure Date  . Abdominal hysterectomy   . Foot surgery   . External ear surgery     bilateral  . Glaucoma surgery   . Colonoscopy 11/2007    hyperplastic polyps, prior hx of adenomas   . Small bowel capsule 10/2007    normal  . Esophagogastroduodenoscopy 08/2007    moderate sized hiatal hernia  . Colonoscopy 05/2010    incomplete due to poor prep, hyperplastic rectal polyp  . Esophagogastroduodenoscopy 05/2010    noncritical schatzki ring s/p 21F  . Colonoscopy 05/05/2002    Dimunitive polyps in the rectum and left colon, cold    biopsied/removed.  Scattered few left-sided diverticula.  Regular colonic   mucosa appeared normal    Current Outpatient Prescriptions  Medication Sig Dispense  Refill  . ALPRAZolam (XANAX) 0.25 MG tablet Take 0.25 mg by mouth 2 (two) times daily as needed. For anxiety      . atomoxetine (STRATTERA) 100 MG capsule Take 100 mg by mouth daily.        . calcium-vitamin D (OSCAL WITH D) 500-200 MG-UNIT per tablet Take 1 tablet by mouth 3 (three) times daily.        Marland Kitchen esomeprazole (NEXIUM) 40 MG capsule Take 1 capsule (40 mg total) by mouth 2 (two) times daily. 30 minutes before breakfast and 30 minutes before dinner  60 capsule  3  . furosemide (LASIX) 40 MG tablet Take 80 mg by mouth daily.        . hydrOXYzine (ATARAX) 50 MG tablet Take 50 mg by mouth daily.        Marland Kitchen lamoTRIgine (LAMICTAL) 100 MG tablet Take 100 mg by mouth daily.        Marland Kitchen lisinopril (PRINIVIL,ZESTRIL) 5 MG tablet Take 5 mg by mouth daily.        . mirtazapine (REMERON) 30 MG tablet Take 30 mg by mouth at bedtime.        . nabumetone (RELAFEN) 500 MG tablet Take 500 mg by mouth 2 (two) times daily.        . paliperidone (INVEGA) 9 MG 24 hr tablet Take 9 mg by mouth every morning.        . potassium chloride (KLOR-CON) 10 MEQ CR tablet Take  10 mEq by mouth daily.        . sertraline (ZOLOFT) 100 MG tablet Take 200 mg by mouth daily.          Allergies as of 10/05/2011 - Review Complete 10/05/2011  Allergen Reaction Noted  . Acetaminophen    . Aspirin    . Aspirin-acetaminophen-caffeine    . Nsaids    . Penicillins    . Thorazine (chlorpromazine hcl)  10/20/2010    Family History  Problem Relation Age of Onset  . Colon cancer      aunt  . Liver disease Neg Hx   . Inflammatory bowel disease Neg Hx   . Stroke Mother   . Heart attack Father     History   Social History  . Marital Status: Divorced    Spouse Name: N/A    Number of Children: N/A  . Years of Education: N/A   Occupational History  . disabled    Social History Main Topics  . Smoking status: Current Everyday Smoker -- 1.0 packs/day for 40 years    Types: Cigarettes  . Smokeless tobacco: Never Used  .  Alcohol Use: No     social drinker  . Drug Use: No  . Sexually Active: Yes -- Female partner(s)    Birth Control/ Protection: None     boyfriend   Other Topics Concern  . None   Social History Narrative  . None    Review of Systems: Gen:SEE HPI  CV: Denies chest pain, palpitations, syncope, peripheral edema, and claudication. Resp: Denies dyspnea at rest, cough, wheezing, coughing up blood, and pleurisy. GI: SEE HPI Derm: Denies rash, itching, dry skin Psych: Denies depression, anxiety, memory loss, confusion. No homicidal or suicidal ideation.  Heme: Denies bruising, bleeding, and enlarged lymph nodes.  Physical Exam: BP 125/81  Pulse 82  Temp(Src) 98.2 F (36.8 C) (Temporal)  Ht 5' 10.5" (1.791 m)  Wt 263 lb 12.8 oz (119.659 kg)  BMI 37.32 kg/m2 General:   Alert and oriented. No distress noted. Head:  Normocephalic and atraumatic. Eyes:  Conjuctiva clear without scleral icterus. Mouth:  Oral mucosa pink and moist. No lesions. Neck:  Supple, without mass or thyromegaly. Heart:  S1, S2 present without murmurs, rubs, or gallops. Regular rate and rhythm. Abdomen:  +BS, soft, diffusely TTP and non-distended. No rebound or guarding. No HSM or masses noted.  Msk:  Symmetrical without gross deformities. Normal posture. Extremities:  2+ LEE  Neurologic:  Alert and  oriented x4;  grossly normal neurologically. Skin:  Intact without significant lesions or rashes. Cervical Nodes:  No significant cervical adenopathy. Psych:  Alert and cooperative. Normal mood and affect.

## 2011-10-05 NOTE — Patient Instructions (Addendum)
Stop Amitiza. Start taking a probiotic. We have set you up for a CT scan in the near future.   Please have blood work completed. We will call you with the results.  We have set you up for an upper endoscopy in the near future due to difficulty swallowing.   It is important that you exercise most days of the week and follow a low-fat diet. This will help with your symptoms of reflux as well as reduce the fat in your liver.   We will see you back in 3 months.   IMPORTANT: If you notice worsening of belly pain, nausea, vomiting, fever, chills, seek medical attention.

## 2011-10-05 NOTE — Telephone Encounter (Signed)
CT scheduled for 04/02 @ 9:30- pts request- she is aware to pick up contrast before hand and to be NPO after midnight

## 2011-10-08 DIAGNOSIS — R109 Unspecified abdominal pain: Secondary | ICD-10-CM | POA: Insufficient documentation

## 2011-10-08 NOTE — Assessment & Plan Note (Signed)
Hx of fatty liver in remote past, need updated LFTs. Pt has continued to gain wt despite our discussion of low-fat, reflux diet.

## 2011-10-08 NOTE — Assessment & Plan Note (Signed)
Chronic GERD, doing well on Nexium BID. Continues to gain wt. Discussed importance of losing wt to help assist with GERD symptoms. Due to mental capacity limitations, may be challenging.

## 2011-10-08 NOTE — Assessment & Plan Note (Signed)
51 year old female with hx of mild mental retardation, chronic constipation, now presenting with 8-10 loose stools reportedly per day since Feb, time of last visit. She is still on Amitiza 24 BID and had been doing well with that in the past. Physical exam with tenderness diffusely, no rebound or guarding. Some question of fever in past but not documented. No sick contacts. Despite reports of diarrhea, she has continued to gain wt at each visit since May of last year. No rectal bleeding. Last TCS in Nov 2011, incomplete due to poor prep, hx of adenomatous polyps. At this point, we will stop the Amitiza completely. I am setting her up for a CT scan due to findings on physical exam, and I have requested she have this done today. However, she states she is unable due to transportation. I discussed the importance of this, and she still would rather wait. Signs/symptoms discussed that would warrant emergent treatment.   If loose stools continue after cessation of Amitiza, we will obtain stool studies.  Await CT CBC with diff

## 2011-10-08 NOTE — Assessment & Plan Note (Signed)
Significantly TTP on physical exam, diffusely, but no rebound/guarding. Set up for CT, which I had preferred day of visit. Pt states she is unable to do this due to transportation. Stop Amitiza due to reports of diarrhea since Feb. Monitor for any worsening of symptoms, check CBC. Await CT findings.

## 2011-10-08 NOTE — Assessment & Plan Note (Signed)
Continued dysphagia with pills and solid food. Hx of Schatzki's ring s/p dilation 2011. Symptoms continued despite reported dietary modification at last visit. Wt continues to climb, question dysphagia multifactorial at this point secondary to hx of Schatzki's ring and possibly uncontrolled GERD. Will proceed with EGD/ED with RMR in future.  ~We will facilitate sedation with Phenergan 25 mg IV due to polypharmacy.  ~Proceed with upper endoscopy and dilation  in the near future with Dr. Jena Gauss. The risks, benefits, and alternatives have been discussed in detail with patient. They have stated understanding and desire to proceed.

## 2011-10-08 NOTE — Assessment & Plan Note (Signed)
Poor prep 2011. Will need to discuss timing of next TCS with RMR.

## 2011-10-09 ENCOUNTER — Encounter: Payer: Self-pay | Admitting: Internal Medicine

## 2011-10-09 LAB — CBC WITH DIFFERENTIAL/PLATELET
Hemoglobin: 13.5 g/dL (ref 12.0–15.0)
Lymphocytes Relative: 50 % — ABNORMAL HIGH (ref 12–46)
Lymphs Abs: 2.1 10*3/uL (ref 0.7–4.0)
Monocytes Relative: 10 % (ref 3–12)
Neutro Abs: 1.6 10*3/uL — ABNORMAL LOW (ref 1.7–7.7)
Neutrophils Relative %: 37 % — ABNORMAL LOW (ref 43–77)
Platelets: 202 10*3/uL (ref 150–400)
RBC: 4.48 MIL/uL (ref 3.87–5.11)
WBC: 4.3 10*3/uL (ref 4.0–10.5)

## 2011-10-09 LAB — BASIC METABOLIC PANEL
CO2: 31 mEq/L (ref 19–32)
Glucose, Bld: 104 mg/dL — ABNORMAL HIGH (ref 70–99)
Potassium: 3.8 mEq/L (ref 3.5–5.3)
Sodium: 140 mEq/L (ref 135–145)

## 2011-10-09 LAB — HEPATIC FUNCTION PANEL
Albumin: 4.2 g/dL (ref 3.5–5.2)
Indirect Bilirubin: 0.1 mg/dL (ref 0.0–0.9)
Total Protein: 7.4 g/dL (ref 6.0–8.3)

## 2011-10-09 NOTE — Progress Notes (Signed)
Faxed to PCP

## 2011-10-10 ENCOUNTER — Encounter (HOSPITAL_COMMUNITY): Payer: Self-pay

## 2011-10-10 ENCOUNTER — Ambulatory Visit (HOSPITAL_COMMUNITY)
Admission: RE | Admit: 2011-10-10 | Discharge: 2011-10-10 | Disposition: A | Discharge status: Home / Self Care | Payer: PRIVATE HEALTH INSURANCE | Source: Ambulatory Visit | Attending: Gastroenterology | Admitting: Gastroenterology

## 2011-10-10 DIAGNOSIS — R112 Nausea with vomiting, unspecified: Secondary | ICD-10-CM | POA: Insufficient documentation

## 2011-10-10 DIAGNOSIS — R197 Diarrhea, unspecified: Secondary | ICD-10-CM | POA: Insufficient documentation

## 2011-10-10 DIAGNOSIS — R509 Fever, unspecified: Secondary | ICD-10-CM | POA: Insufficient documentation

## 2011-10-10 DIAGNOSIS — I10 Essential (primary) hypertension: Secondary | ICD-10-CM | POA: Insufficient documentation

## 2011-10-10 DIAGNOSIS — R918 Other nonspecific abnormal finding of lung field: Secondary | ICD-10-CM | POA: Insufficient documentation

## 2011-10-10 DIAGNOSIS — R109 Unspecified abdominal pain: Secondary | ICD-10-CM | POA: Insufficient documentation

## 2011-10-10 DIAGNOSIS — E119 Type 2 diabetes mellitus without complications: Secondary | ICD-10-CM | POA: Insufficient documentation

## 2011-10-10 DIAGNOSIS — K429 Umbilical hernia without obstruction or gangrene: Secondary | ICD-10-CM | POA: Insufficient documentation

## 2011-10-10 MED ORDER — IOHEXOL 300 MG/ML  SOLN
100.0000 mL | Freq: Once | INTRAMUSCULAR | Status: AC | PRN
  Administered 2011-10-10: 100 mL via INTRAVENOUS

## 2011-10-23 NOTE — Progress Notes (Signed)
Quick Note:  No leukocytosis or anemia.  HFP better (AST/ALT normalized).  CT showed tiny umbilical hernia, possible fatty liver, nothing to explain abdominal discomfort. However, noted a questionable 7mm nodule medial right middle lobe lung base. Need dedicated CT chest to further evaluate.   1. How is diarrhea since stopping Amitiza? 2. How is abdominal pain? 3. Needs CT chest due to nodule in right middle lobe.   ______

## 2011-10-23 NOTE — Progress Notes (Signed)
Quick Note:  Setting up for CT chest.  Further notes under labs. ______

## 2011-10-24 NOTE — Progress Notes (Signed)
Quick Note:  Tried to call pt- NA ______ 

## 2011-10-25 NOTE — Progress Notes (Signed)
Quick Note:  Tried to call pt- NA ______ 

## 2011-10-25 NOTE — Progress Notes (Signed)
Quick Note:  Mailed letter asking pt to call for results. ______

## 2011-10-26 ENCOUNTER — Other Ambulatory Visit: Payer: Self-pay | Admitting: Gastroenterology

## 2011-10-26 NOTE — Telephone Encounter (Signed)
[name], I see you stopped this patient's Amitiza recently for diarrhea. Refill was requested. Should she remain off the Amitiza?

## 2011-10-30 ENCOUNTER — Other Ambulatory Visit: Payer: Self-pay | Admitting: Gastroenterology

## 2011-10-30 ENCOUNTER — Telehealth: Payer: Self-pay

## 2011-10-30 DIAGNOSIS — R911 Solitary pulmonary nodule: Secondary | ICD-10-CM

## 2011-10-30 NOTE — Telephone Encounter (Signed)
Pt left VM that she was calling for results. Call back number is [phone]. RMR pt. Routing to [place].

## 2011-10-30 NOTE — Telephone Encounter (Signed)
Pt is still having diarrhea 3x a day. No abd pain right now.

## 2011-10-30 NOTE — Telephone Encounter (Signed)
I'd like her to contact our office before refilling this. We have sent a letter for her to contact us. Tried to contact her multiple times for a progress report.  If diarrhea resolved, reevaluate dosing, consider other agent.

## 2011-10-30 NOTE — Telephone Encounter (Signed)
Spoke with pt

## 2011-11-01 ENCOUNTER — Ambulatory Visit (HOSPITAL_COMMUNITY)
Admission: RE | Admit: 2011-11-01 | Discharge: 2011-11-01 | Disposition: A | Discharge status: Home / Self Care | Payer: PRIVATE HEALTH INSURANCE | Source: Ambulatory Visit | Attending: Gastroenterology | Admitting: Gastroenterology

## 2011-11-01 ENCOUNTER — Encounter: Payer: Self-pay | Admitting: Gastroenterology

## 2011-11-01 DIAGNOSIS — R1310 Dysphagia, unspecified: Secondary | ICD-10-CM | POA: Insufficient documentation

## 2011-11-01 DIAGNOSIS — E119 Type 2 diabetes mellitus without complications: Secondary | ICD-10-CM | POA: Insufficient documentation

## 2011-11-01 DIAGNOSIS — R911 Solitary pulmonary nodule: Secondary | ICD-10-CM | POA: Insufficient documentation

## 2011-11-01 MED ORDER — IOHEXOL 300 MG/ML  SOLN
80.0000 mL | Freq: Once | INTRAMUSCULAR | Status: AC | PRN
  Administered 2011-11-01: 80 mL via INTRAVENOUS

## 2011-11-01 NOTE — Telephone Encounter (Signed)
Do not take Amitiza.  Need stool studies:  Cdiff PCR Stool culture.  Was hoping stopping Amitiza would address this issue.

## 2011-11-01 NOTE — Progress Notes (Unsigned)
Received refill request from pharmacy for Amitiza. Pt still having loose stools, three a day.  Make sure NOT taking Amitiza as instructed.  Obtain Cdiff PCR, Stool culture if NOT taking Amitiza. Was hoping holding off on Amitiza would help.

## 2011-11-02 NOTE — Progress Notes (Signed)
REVIEWED.  

## 2011-11-02 NOTE — Telephone Encounter (Signed)
Tried to call pt- NA 

## 2011-11-02 NOTE — Progress Notes (Signed)
Tried to call pt- NA 

## 2011-11-07 ENCOUNTER — Telehealth: Payer: Self-pay

## 2011-11-07 NOTE — Progress Notes (Signed)
Quick Note:  Tried to call pt- NA ______ 

## 2011-11-07 NOTE — Progress Notes (Signed)
Quick Note:  CT chest shows right middle lobe nodule, unable to really assess if this is stable or not from prior scans.  Non-specific. May be benign but unable to exclude malignancy. Would be difficult to biopsy.  Regardless, this needs to be sent to Dr. Gerda Diss, along with this result note.  We will let him decide on f/u chest CT in 3 mos or PET scan now.  Let's make sure his nurse is aware this is coming; I don't want it to be overlooked. ______

## 2011-11-07 NOTE — Telephone Encounter (Signed)
Tried to call pt- NA 

## 2011-11-07 NOTE — Telephone Encounter (Signed)
Opened in error

## 2011-11-08 NOTE — Progress Notes (Signed)
Tried to call pt- NA 

## 2011-11-08 NOTE — Progress Notes (Signed)
Quick Note:  Tried to call pt- NA ______ 

## 2011-11-08 NOTE — Telephone Encounter (Signed)
Tried to call pt- NA 

## 2011-11-09 ENCOUNTER — Other Ambulatory Visit: Payer: Self-pay

## 2011-11-09 ENCOUNTER — Other Ambulatory Visit: Payer: Self-pay | Admitting: Gastroenterology

## 2011-11-09 DIAGNOSIS — R197 Diarrhea, unspecified: Secondary | ICD-10-CM

## 2011-11-09 NOTE — Telephone Encounter (Signed)
Seen 2/14.

## 2011-11-09 NOTE — Telephone Encounter (Signed)
Pt aware, lab order and stool containers at front desk.

## 2011-11-09 NOTE — Progress Notes (Signed)
Pt still with diarrhea, no amitiza. Lab orders and stool containers at front desk.

## 2011-11-10 ENCOUNTER — Other Ambulatory Visit: Payer: Self-pay | Admitting: Gastroenterology

## 2011-11-21 ENCOUNTER — Other Ambulatory Visit: Payer: Self-pay | Admitting: Family Medicine

## 2011-11-21 DIAGNOSIS — R911 Solitary pulmonary nodule: Secondary | ICD-10-CM

## 2011-11-22 NOTE — Progress Notes (Signed)
Pt has not taken an Amitiza in a month. She is having 4 loose stool a day now. I am going to make sure the orders and container are still up front for her. She will come by Thursday to pick them up.

## 2011-11-22 NOTE — Progress Notes (Unsigned)
Pt has not done stool studies. How is diarrhea?

## 2011-11-22 NOTE — Progress Notes (Signed)
Noted  

## 2011-11-29 ENCOUNTER — Encounter (HOSPITAL_COMMUNITY): Payer: Self-pay

## 2011-11-29 ENCOUNTER — Encounter (HOSPITAL_COMMUNITY)
Admission: RE | Admit: 2011-11-29 | Discharge: 2011-11-29 | Disposition: A | Discharge status: Home / Self Care | Payer: PRIVATE HEALTH INSURANCE | Source: Ambulatory Visit | Attending: Family Medicine | Admitting: Family Medicine

## 2011-11-29 DIAGNOSIS — R222 Localized swelling, mass and lump, trunk: Secondary | ICD-10-CM | POA: Insufficient documentation

## 2011-11-29 DIAGNOSIS — R911 Solitary pulmonary nodule: Secondary | ICD-10-CM

## 2011-11-29 DIAGNOSIS — J984 Other disorders of lung: Secondary | ICD-10-CM | POA: Insufficient documentation

## 2011-11-29 DIAGNOSIS — R918 Other nonspecific abnormal finding of lung field: Secondary | ICD-10-CM | POA: Insufficient documentation

## 2011-11-29 HISTORY — DX: Other nonspecific abnormal finding of lung field: R91.8

## 2011-11-29 LAB — GLUCOSE, CAPILLARY: Glucose-Capillary: 113 mg/dL — ABNORMAL HIGH (ref 70–99)

## 2011-11-29 MED ORDER — FLUDEOXYGLUCOSE F - 18 (FDG) INJECTION
18.4000 | Freq: Once | INTRAVENOUS | Status: AC | PRN
  Administered 2011-11-29: 18.4 via INTRAVENOUS

## 2011-12-07 ENCOUNTER — Ambulatory Visit: Payer: PRIVATE HEALTH INSURANCE | Admitting: Gastroenterology

## 2011-12-07 ENCOUNTER — Telehealth: Payer: Self-pay | Admitting: Gastroenterology

## 2011-12-07 NOTE — Telephone Encounter (Signed)
Pt was a no show

## 2011-12-19 ENCOUNTER — Encounter: Payer: PRIVATE HEALTH INSURANCE | Admitting: Thoracic Surgery (Cardiothoracic Vascular Surgery)

## 2011-12-20 ENCOUNTER — Encounter: Payer: PRIVATE HEALTH INSURANCE | Admitting: Thoracic Surgery (Cardiothoracic Vascular Surgery)

## 2011-12-26 ENCOUNTER — Other Ambulatory Visit: Payer: Self-pay

## 2011-12-26 ENCOUNTER — Ambulatory Visit (INDEPENDENT_AMBULATORY_CARE_PROVIDER_SITE_OTHER): Payer: PRIVATE HEALTH INSURANCE | Admitting: Gastroenterology

## 2011-12-26 ENCOUNTER — Encounter: Payer: Self-pay | Admitting: Gastroenterology

## 2011-12-26 VITALS — BP 123/78 | HR 92 | Temp 98.2°F | Ht 70.5 in | Wt 268.6 lb

## 2011-12-26 DIAGNOSIS — R635 Abnormal weight gain: Secondary | ICD-10-CM | POA: Insufficient documentation

## 2011-12-26 DIAGNOSIS — R197 Diarrhea, unspecified: Secondary | ICD-10-CM

## 2011-12-26 DIAGNOSIS — R1319 Other dysphagia: Secondary | ICD-10-CM

## 2011-12-26 DIAGNOSIS — K219 Gastro-esophageal reflux disease without esophagitis: Secondary | ICD-10-CM

## 2011-12-26 DIAGNOSIS — R911 Solitary pulmonary nodule: Secondary | ICD-10-CM

## 2011-12-26 DIAGNOSIS — R109 Unspecified abdominal pain: Secondary | ICD-10-CM

## 2011-12-26 LAB — HEMOCCULT GUIAC POC 1CARD (OFFICE): Fecal Occult Blood, POC: NEGATIVE

## 2011-12-26 MED ORDER — ALIGN 4 MG PO CAPS: 4.0000 mg | ORAL_CAPSULE | Freq: Every day | ORAL | Status: DC

## 2011-12-26 NOTE — Patient Instructions (Signed)
Please have your blood work and stool test done. Once we have the results, we can decide if you need to have your colon and stomach scoped.

## 2011-12-26 NOTE — Assessment & Plan Note (Signed)
Breakthrough symptoms on BID nexium. EGD in near future for dysphagia. Reevaluate at that time. Antireflux measures.

## 2011-12-26 NOTE — Assessment & Plan Note (Signed)
Chronic diarrhea at this point, going on for 3-4 months. Rare solid stool. No longer on Amitiza. Used to have 2-3 BMs/month. Stool studies to be done. Heme negative today on exam and no stool in rectal vault. Check TSH/free T4. Add probiotic such as Align one daily for 3 weeks, samples provided.

## 2011-12-26 NOTE — Assessment & Plan Note (Signed)
May be in part due to edema. She also had significant edema in 09/2011. She is on lasix, demadex. Request she f/u with pcp.

## 2011-12-26 NOTE — Assessment & Plan Note (Signed)
Recent CT ok. Work up diarrhea. If stool test negative, trial of Bentyl.

## 2011-12-26 NOTE — Assessment & Plan Note (Signed)
Plan for EGD/ED in near future but will await w/u for diarrhea before scheduling.

## 2011-12-26 NOTE — Progress Notes (Signed)
Primary Care Physician: [name], MD  Primary Gastroenterologist:  Roetta Sessions, MD   Chief Complaint  Patient presents with  . Abdominal Pain    HPI: [name] is a 51 y.o. female here for f/u abd pain. She has h/o GERD, IBS, constipation. Last seen by Gerrit Halls, NP in 09/2011. At that time she had diarrhea up to 10 stools daily. Amitiza was stopped. She had CT A/P which did not explain her abdominal pain. Right middle lobe ?nodule seen. Chest CT confirmed nodule. Referred back to Dr. Gerda Diss for management. PET done as outlined below. States she has f/u for this. Patient also c/o vague dysphagia at last OV. Notes report EGD to be scheduled but this did not happen.   Still having 5-10 stools daily since 09/2011. Not taking Amitiza or other laxatives. Occasional brbpr. Abdominal pain, periumbilical. Bloating too. Home health nurse came by last week. Some n/v last week. She lives at the [facility] by herself. Still having some heartburn on Nexium BID. Dysphagia mostly with meats and sometimes with pills.   Current Outpatient Prescriptions  Medication Sig Dispense Refill  . ALPRAZolam (XANAX) 0.25 MG tablet Take 0.25 mg by mouth 2 (two) times daily as needed. For anxiety      . atomoxetine (STRATTERA) 100 MG capsule Take 100 mg by mouth daily.        Marland Kitchen buPROPion (WELLBUTRIN SR) 150 MG 12 hr tablet Take 150 mg by mouth 2 (two) times daily.       . calcium-vitamin D (OSCAL WITH D) 500-200 MG-UNIT per tablet Take 1 tablet by mouth 3 (three) times daily.        . furosemide (LASIX) 40 MG tablet Take 80 mg by mouth daily.        . hydrOXYzine (ATARAX) 50 MG tablet Take 50 mg by mouth daily.        Marland Kitchen lamoTRIgine (LAMICTAL) 100 MG tablet Take 100 mg by mouth daily.        Marland Kitchen lisinopril (PRINIVIL,ZESTRIL) 5 MG tablet Take 5 mg by mouth daily.        . mirtazapine (REMERON) 30 MG tablet Take 30 mg by mouth at bedtime.        . nabumetone (RELAFEN) 500 MG tablet Take 500 mg by mouth 2  (two) times daily.        Marland Kitchen NEXIUM 40 MG capsule TAKE 1 CAPSULE BY MOUTH TWICE DAILY. 30 MINUTES BEFORE BREAKFAST & BEFORE DINNER.  60 each  5  . paliperidone (INVEGA) 9 MG 24 hr tablet Take 9 mg by mouth every morning.        . potassium chloride (KLOR-CON) 10 MEQ CR tablet Take 10 mEq by mouth daily.        . sertraline (ZOLOFT) 100 MG tablet Take 200 mg by mouth daily.        Marland Kitchen torsemide (DEMADEX) 20 MG tablet Take 20 mg by mouth daily.      . traZODone (DESYREL) 100 MG tablet       . DISCONTD: buPROPion (WELLBUTRIN) 100 MG tablet Take 100 mg by mouth 2 (two) times daily.        Allergies as of 12/26/2011 - Review Complete 12/26/2011  Allergen Reaction Noted  . Acetaminophen    . Aspirin    . Aspirin-acetaminophen-caffeine    . Nsaids    . Penicillins    . Thorazine (chlorpromazine hcl)  10/20/2010   Past Medical History  Diagnosis Date  . Mental  retardation   . Hyperglycemia   . HTN (hypertension)   . Migraines   . Anxiety   . IBS (irritable bowel syndrome)   . Diabetes mellitus   . Lung nodules     right, followed by PCP, PET 11/2011   Past Surgical History  Procedure Date  . Abdominal hysterectomy   . Foot surgery   . External ear surgery     bilateral  . Glaucoma surgery   . Colonoscopy 11/2007    hyperplastic polyps, prior hx of adenomas   . Small bowel capsule 10/2007    normal  . Esophagogastroduodenoscopy 08/2007    moderate sized hiatal hernia  . Colonoscopy 05/2010    incomplete due to poor prep, hyperplastic rectal polyp  . Esophagogastroduodenoscopy 05/2010    noncritical schatzki ring s/p 16F  . Colonoscopy 05/05/2002    Dimunitive polyps in the rectum and left colon, cold    biopsied/removed.  Scattered few left-sided diverticula.  Regular colonic   mucosa appeared normal   Family History  Problem Relation Age of Onset  . Colon cancer      aunt  . Liver disease Neg Hx   . Inflammatory bowel disease Neg Hx   . Stroke Mother   . Heart attack  Father    History   Social History  . Marital Status: Divorced    Spouse Name: N/A    Number of Children: N/A  . Years of Education: N/A   Occupational History  . disabled    Social History Main Topics  . Smoking status: Current Everyday Smoker -- 1.0 packs/day for 40 years    Types: Cigarettes  . Smokeless tobacco: Never Used  . Alcohol Use: No     social drinker  . Drug Use: No  . Sexually Active: Yes -- Female partner(s)    Birth Control/ Protection: None     boyfriend   Other Topics Concern  . None   Social History Narrative  . None    ROS:  General: Negative for anorexia, weight loss, fever, chills, fatigue, weakness. ENT: Negative for hoarseness, nasal congestion. CV: Negative for chest pain, angina, palpitations, dyspnea on exertion, peripheral edema.  Respiratory: Negative for dyspnea at rest, dyspnea on exertion, cough, sputum, wheezing.  GI: See history of present illness. GU:  Negative for dysuria, hematuria, urinary incontinence, urinary frequency, nocturnal urination.  Endo: Negative for unusual weight change.    Physical Examination:   BP 123/78  Pulse 92  Temp 98.2 F (36.8 C) (Temporal)  Ht 5' 10.5" (1.791 m)  Wt 268 lb 9.6 oz (121.836 kg)  BMI 38.00 kg/m2  General: Well-nourished, well-developed in no acute distress.  Eyes: No icterus. Mouth: Oropharyngeal mucosa moist and pink , no lesions erythema or exudate. Lungs: Clear to auscultation bilaterally.  Heart: Regular rate and rhythm, no murmurs rubs or gallops.  Abdomen: Bowel sounds are normal, diffuse tenderness to light palpation, nondistended, no hepatosplenomegaly or masses, no abdominal bruits or hernia , no rebound or guarding.   Extremities: 1-2+ PE pretibial/ankle bilaterally. No clubbing or deformities. Neuro: Alert and oriented x 4   Skin: Warm and dry, no jaundice.   Psych: Alert and cooperative, normal mood and affect.   Imaging Studies: Nm Pet Image Initial (pi) Skull Base  To Thigh  11/29/2011  *RADIOLOGY REPORT*  Clinical Data:  Recent imaging demonstrates a solitary pulmonary nodule.  FDG PET CT requested to evaluate for possible malignancy.  NUCLEAR MEDICINE PET SKULL BASE TO THIGH  Fasting Blood Glucose:  113  Technique:  18.4 mCi F-18 FDG was injected intravenously via the right antecubital fossa.  CT data was obtained and used for attenuation correction and anatomic localization only.  (This was not acquired as a diagnostic CT examination.) Additional exam technical data entered on technologist worksheet.  Comparison:  CT 11/01/2011  Findings:  Head/Neck:  No hypermetabolic lymph nodes in the neck.  Chest:  Within the right middle lobe there is a 8 mm pulmonary nodule adjacent to the right atrium with mild metabolic activity ( SUV max = 2.5).  Additional hypermetabolic nodules are present.  No hypermetabolic mediastinal lymph nodes.  There is a single hypermetabolic right axillary lymph node which is normal pathology.  There is misregistration in the right axilla due to patient motion.  Abdomen/Pelvis:  No abnormal hypermetabolic activity within the liver, pancreas, adrenal glands, or spleen.  No hypermetabolic lymph nodes in the abdomen or pelvis.  Skeleton:  No focal hypermetabolic activity to suggest skeletal metastasis. Sclerosis of the left SI joint appears benign.  IMPRESSION:  1.  Mildly hypermetabolic nodule within the right middle lobe. Although the activity is mild, for a nodule this side this is concerning although nonspecific.  If biopsy is not feasible due to location, recommend short-term follow-up CT with contrast  in 1 to 3 months. 2.  No evidence of metastasis. 3.  Hypermetabolic right axial lymph node is likely reactive.  Original Report Authenticated By: Genevive Bi, M.D.

## 2011-12-26 NOTE — Assessment & Plan Note (Signed)
F/u with PCP as planned. 

## 2012-01-09 ENCOUNTER — Encounter: Payer: Self-pay | Admitting: *Deleted

## 2012-01-09 ENCOUNTER — Encounter: Payer: Self-pay | Admitting: Cardiothoracic Surgery

## 2012-01-09 ENCOUNTER — Other Ambulatory Visit: Payer: Self-pay | Admitting: Cardiothoracic Surgery

## 2012-01-09 ENCOUNTER — Encounter: Payer: PRIVATE HEALTH INSURANCE | Admitting: Thoracic Surgery (Cardiothoracic Vascular Surgery)

## 2012-01-09 ENCOUNTER — Institutional Professional Consult (permissible substitution) (INDEPENDENT_AMBULATORY_CARE_PROVIDER_SITE_OTHER): Payer: PRIVATE HEALTH INSURANCE | Admitting: Cardiothoracic Surgery

## 2012-01-09 VITALS — BP 148/94 | HR 94 | Resp 18 | Ht 70.5 in | Wt 345.0 lb

## 2012-01-09 DIAGNOSIS — R911 Solitary pulmonary nodule: Secondary | ICD-10-CM

## 2012-01-09 DIAGNOSIS — J984 Other disorders of lung: Secondary | ICD-10-CM

## 2012-01-09 NOTE — Progress Notes (Signed)
[address]            [place] [address]          [phone]      [name] [facility] Medical Record #[id number] Date of Birth: [birth year]  Referring: Babs Sciara, MD Primary Care: [name], MD  Chief Complaint:    Chief Complaint  Patient presents with  . Lung Lesion    eval and treat....[email or web address]@APH , PET    History of Present Illness:    Patient is a 51 year old female with long-term history of smoking who had the incidental finding of the 8mm nodule in the right middle lobe. She is referred to thoracic surgery for evaluation of this and to develop a treatment plan. She's had no previous history of lung cancer. She does have a history of limited mental capacity and comes to the office today with her social worker from [place]. She's had no history of tuberculosis or known tuberculosis exposure.  She does give a history of having a myocardial infarction in being hospitalized for 5 days at age 63 no other details about this revealed. She does note that at times especially with exertion she feels a pounding heart and chest discomfort with exertional shortness of breath. She denies resting shortness of breath.  She does give a history of having a right-sided stroke 5 years ago symptoms of this have resolved.      Current Activity/ Functional Status: Patient Patient Patient is independent with mobility/ambulation, transfers, ADL's, IADL's. The patient does level and but has assistance of a Child psychotherapist because of her limited mental capacity.      Past Medical History  Diagnosis Date  . Mental retardation   . Hyperglycemia   . HTN (hypertension)   . Migraines   . Anxiety   . IBS (irritable bowel syndrome)   . Diabetes mellitus   . Lung nodules     right, followed by PCP, PET 11/2011    Past Surgical History  Procedure Date  . Abdominal hysterectomy   . Foot surgery   . External ear surgery     bilateral  .  Glaucoma surgery   . Colonoscopy 11/2007    hyperplastic polyps, prior hx of adenomas   . Small bowel capsule 10/2007    normal  . Esophagogastroduodenoscopy 08/2007    moderate sized hiatal hernia  . Colonoscopy 05/2010    incomplete due to poor prep, hyperplastic rectal polyp  . Esophagogastroduodenoscopy 05/2010    noncritical schatzki ring s/p 78F  . Colonoscopy 05/05/2002    Dimunitive polyps in the rectum and left colon, cold    biopsied/removed.  Scattered few left-sided diverticula.  Regular colonic   mucosa appeared normal    Family History  Problem Relation Age of Onset  . Colon cancer      aunt  . Liver disease Neg Hx   . Inflammatory bowel disease Neg Hx   . Stroke Mother   . Heart attack Father       History  Smoking status  . Current Everyday Smoker -- 0.2 packs/day for 40 years  . Types: Cigarettes  Smokeless tobacco  . Never Used    History  Alcohol Use No   Says does not drink alcohol      Allergies  Allergen Reactions  . Acetaminophen   . Aspirin     REACTION: UNKNOWN REACTION  .  Aspirin-Acetaminophen-Caffeine   . Nsaids     REACTION: UNKNOWN REACTION  . Penicillins     REACTION: UNKNOWN REACTION  . Thorazine (Chlorpromazine Hcl)     Current Outpatient Prescriptions  Medication Sig Dispense Refill  . ALPRAZolam (XANAX) 0.25 MG tablet Take 0.25 mg by mouth 2 (two) times daily as needed. For anxiety      . atomoxetine (STRATTERA) 100 MG capsule Take 100 mg by mouth daily.        Marland Kitchen buPROPion (WELLBUTRIN SR) 150 MG 12 hr tablet Take 150 mg by mouth 2 (two) times daily.       . calcium-vitamin D (OSCAL WITH D) 500-200 MG-UNIT per tablet Take 1 tablet by mouth 3 (three) times daily.        . furosemide (LASIX) 40 MG tablet Take 80 mg by mouth daily.        . hydrOXYzine (ATARAX) 50 MG tablet Take 50 mg by mouth daily.        Marland Kitchen lamoTRIgine (LAMICTAL) 100 MG tablet Take 100 mg by mouth daily.        Marland Kitchen lisinopril (PRINIVIL,ZESTRIL) 5 MG tablet Take  5 mg by mouth daily.        . mirtazapine (REMERON) 30 MG tablet Take 30 mg by mouth at bedtime.        . nabumetone (RELAFEN) 500 MG tablet Take 500 mg by mouth 2 (two) times daily.        Marland Kitchen NEXIUM 40 MG capsule TAKE 1 CAPSULE BY MOUTH TWICE DAILY. 30 MINUTES BEFORE BREAKFAST & BEFORE DINNER.  60 each  5  . paliperidone (INVEGA) 9 MG 24 hr tablet Take 9 mg by mouth every morning.        . potassium chloride (KLOR-CON) 10 MEQ CR tablet Take 10 mEq by mouth daily.        . Probiotic Product (ALIGN) 4 MG CAPS Take 4 mg by mouth daily.  21 capsule  0  . sertraline (ZOLOFT) 100 MG tablet Take 200 mg by mouth daily.        Marland Kitchen torsemide (DEMADEX) 20 MG tablet Take 20 mg by mouth daily.      . traZODone (DESYREL) 100 MG tablet       . Olopatadine HCl (PATADAY) 0.2 % SOLN Apply to eye.           Review of Systems:     Cardiac Review of Systems: Y or N  Chest Pain [ y ]  Resting SOB [ n  ] Exertional SOB  Cove.Etienne ]  Pollyann Kennedy Milo.Brash ]   Pedal Edema Cove.Etienne   ]    Palpitations Cove.Etienne  ] Syncope  Cove.Etienne  ]   Presyncope [ n  ]  General Review of Systems: [Y] = yes [  ]=no Constitional: recent weight change [ y ]; anorexia [  ]; fatigue [  ]; nausea [  ]; night sweats [  ]; fever [  ]; or chills [  ];  Dental: poor dentition[y  ];  Eye : blurred vision [  ]; diplopia [   ]; vision changes [  ];  Amaurosis fugax[  ]; Resp: cough [  ];  wheezing[  ];  hemoptysis[  ]; shortness of breath[  ]; paroxysmal nocturnal dyspnea[  ]; dyspnea on exertion[  ]; or orthopnea[  ];  GI:  gallstones[  ], vomiting[  ];  dysphagia[  ]; melena[  ];  hematochezia [  ]; heartburn[  ];   Hx of  Colonoscopy[  ]; GU: kidney stones [  ]; hematuria[  ];   dysuria [  ];  nocturia[  ];  history of     obstruction [  ];             Skin: rash, swelling[  ];, hair loss[  ];  peripheral edema[  ];  or itching[   ]; Musculosketetal: myalgias[  ];  joint swelling[  ];  joint erythema[  ];  joint pain[  ];  back pain[  ];  Heme/Lymph: bruising[  ];  bleeding[  ];  anemia[  ];  Neuro: TIA[  ];  headaches[  ];  stroke[y  ];  vertigo[y  ];  seizures[y  ];   paresthesias[ y ];  difficulty walking[ y ];  Psych:depression[ y ]; anxiety[ y ];  Endocrine: diabetes[  ];  thyroid dysfunction[n  ];  Immunizations: Flu [ ? ]; Pneumococcal[?  ];  Other: Patient has a very positive review of systems including weight gain loss of appetite chest pain chest tightness shortness of breath and lying flat palpitation shortness of breath with exertion heart murmur atrial fibrillation arrhythmia productive cough bronchitis asthma wheezing abdominal pain constipation reflux trouble swallowing pain in her legs with walking pain feet when lying flat temporary blindness in one eye dizziness blackouts headaches muscle pain joint pain arthritis depression nervousness change in eyesight and change in hearing.  Physical Exam: BP 148/94  Pulse 94  Resp 18  Ht 5' 10.5" (1.791 m)  Wt 345 lb (156.491 kg)  BMI 48.80 kg/m2  SpO2 97%  General appearance: alert, cooperative, appears older than stated age and no distress Neurologic: intact Heart: regular rate and rhythm, S1, S2 normal, no murmur, click, rub or gallop and normal apical impulse Lungs: clear to auscultation bilaterally and normal percussion bilaterally Abdomen: soft, non-tender; bowel sounds normal; no masses,  no organomegaly Extremities: edema mild bil pedal edema and Homans sign is negative, no sign of DVT Wound:  Patient has no carotid bruits no cervical supraclavicular adenopathy is appreciated, especially in the area noted on that scan in the right axilla   Diagnostic Studies & Laboratory data:     Recent Radiology Findings:  11/09/2011 RADIOLOGY REPORT*  Clinical Data: Right basilar pulmonary nodule on abdominal CT.  Dysphagia. The patient has diabetes mellitus  and unspecified  mental retardation.  CT CHEST WITH CONTRAST  Technique: Multidetector CT imaging of the chest was performed  following the standard protocol during bolus administration of  intravenous contrast.  Contrast: 80mL OMNIPAQUE IOHEXOL 300 MG/ML SOLN  Comparison: Abdominal CTs 10/10/2011 and 01/19/2005. Chest  radiographs 10/23/2001.  Findings: As demonstrated on the recent abdominal CT, there is a  lobulated well circumscribed 10 x 9 mm right middle lobe nodule on  image 35. This is noncalcified. This area of the lung bases was  not imaged on the 2006 CT. No other pulmonary nodules are  identified. There is no airspace disease or confluent airspace  opacity.  There are no enlarged mediastinal or hilar lymph nodes. There is  no pleural or pericardial effusion. Minimal aortic atherosclerosis  is noted.  Images through the upper abdomen demonstrate diffuse hepatic  steatosis. There is no adrenal mass.  IMPRESSION:  1. Lobulated noncalcified 1-cm right middle lobe nodule was not  imaged on prior abdominal CT. This nodule is not conclusively  demonstrated on radiographs such that stability cannot be  addressed.  2. This nodule is nonspecific and could reflect a hamartoma or  other benign lesion. However, malignancy cannot be excluded.  3. No other nodules, adenopathy or acute findings.  This nodule would be difficult to biopsy given its close proximity  to the heart. Management options include PET CT to assess for  hypermetabolic activity and follow-up CT (depending on risk factors  for malignancy). If follow-up is elected, follow-up chest CT at 3  months is recommended. This recommendation is adapted from the  consensus statement: Guidelines for Management of Small Pulmonary  Nodules Detected on CT Scans: A Statement from the Fleischner  Society as published in Radiology 2005; 237:395-400.  Original Report Authenticated By: Gerrianne Scale, M.D.  Kary Kos,  MD Tue Jan 09, 2012 2:52:39 PM EDT       **ADDENDUM** CREATED: 01/09/2012 14:43:25  Voice recognition errors:  Within the Findings section, the second sentence of the CHEST  section should read: "NO additional hypermetabolic nodules are  present."  Within the first IMPRESSION, the last phrase of the first sentence  should read: "for a nodule this SIZE."  Findings discussed with Dr. Tyrone Sage on 07/ 08/2011  **END ADDENDUM** SIGNED BY: Genevive Bi, M.D.      Study Result     *RADIOLOGY REPORT*  Clinical Data: Recent imaging demonstrates a solitary pulmonary  nodule. FDG PET CT requested to evaluate for possible malignancy.  NUCLEAR MEDICINE PET SKULL BASE TO THIGH  Fasting Blood Glucose: 113  Technique: 18.4 mCi F-18 FDG was injected intravenously via the  right antecubital fossa. CT data was obtained and used for  attenuation correction and anatomic localization only. (This was  not acquired as a diagnostic CT examination.) Additional exam  technical data entered on technologist worksheet.  Comparison: CT 11/01/2011  Findings:  Head/Neck: No hypermetabolic lymph nodes in the neck.  Chest: Within the right middle lobe there is a 8 mm pulmonary  nodule adjacent to the right atrium with mild metabolic activity (  SUV max = 2.5). Additional hypermetabolic nodules are present. No  hypermetabolic mediastinal lymph nodes.  There is a single hypermetabolic right axillary lymph node which is  normal pathology. There is misregistration in the right axilla due  to patient motion.  Abdomen/Pelvis: No abnormal hypermetabolic activity within the  liver, pancreas, adrenal glands, or spleen. No hypermetabolic  lymph nodes in the abdomen or pelvis.  Skeleton: No focal hypermetabolic activity to suggest skeletal  metastasis. Sclerosis of the left SI joint appears benign.  IMPRESSION:  1. Mildly hypermetabolic nodule within the right middle lobe.  Although the activity is mild, for a nodule  this side this is  concerning although nonspecific. If biopsy is not feasible due to  location, recommend short-term follow-up CT with contrast in 1 to  3 months.  2. No evidence of metastasis.  3. Hypermetabolic right axial lymph node is likely reactive.  Original Report Authenticated By: Genevive Bi, M.D.       Recent Lab Findings: Lab Results  Component Value Date   WBC 4.3  10/09/2011   HGB 13.5 10/09/2011   HCT 40.9 10/09/2011   PLT 202 10/09/2011   GLUCOSE 104* 10/09/2011   CHOL  Value: 147        ATP III CLASSIFICATION:  <200     mg/dL   Desirable  841-324  mg/dL   Borderline High  >=401    mg/dL   High 02/72/5366   TRIG 90 05/23/2008   HDL 51 05/23/2008   LDLCALC  Value: 78        Total Cholesterol/HDL:CHD Risk Coronary Heart Disease Risk Table                     Men   Women  1/2 Average Risk   3.4   3.3 05/23/2008   ALT 18 10/09/2011   AST 27 10/09/2011   NA 140 10/09/2011   K 3.8 10/09/2011   CL 100 10/09/2011   CREATININE 0.87 10/09/2011   BUN 7 10/09/2011   CO2 31 10/09/2011      Assessment / Plan:   8 mm right middle lobe lung nodule in a smoker abutting the right margin of the heart The size and location of this nodule makes it very difficult to biopsy percutaneously or with ENB . I discussed with her the treatment options and have recommended that we wait 3 months and repeat CT scan. In  the meantime we will evaluate her pulmonary function studies, she will make an attempt to stop smoking, and we will obtain cardiac clearance for what sounds symptomatically like exertional angina in a diabetic who smokes.  I spent at least 10 minutes with her and her social worker discussing methods for her to stop smoking and the need to.     . Mental retardation   . Hyperglycemia   . HTN (hypertension)   . Migraines   . Anxiety   . IBS (irritable bowel syndrome)   . Diabetes mellitus   .               Delight Ovens MD  Beeper [phone] Office [phone] 01/09/2012 3:11  PM

## 2012-01-16 ENCOUNTER — Ambulatory Visit (INDEPENDENT_AMBULATORY_CARE_PROVIDER_SITE_OTHER): Payer: 59 | Admitting: Cardiovascular Disease

## 2012-01-16 ENCOUNTER — Encounter: Payer: Self-pay | Admitting: Cardiovascular Disease

## 2012-01-16 VITALS — BP 130/83 | HR 80 | Ht 70.5 in | Wt 260.0 lb

## 2012-01-16 DIAGNOSIS — I10 Essential (primary) hypertension: Secondary | ICD-10-CM

## 2012-01-16 DIAGNOSIS — R911 Solitary pulmonary nodule: Secondary | ICD-10-CM

## 2012-01-16 DIAGNOSIS — E119 Type 2 diabetes mellitus without complications: Secondary | ICD-10-CM

## 2012-01-16 DIAGNOSIS — Z720 Tobacco use: Secondary | ICD-10-CM | POA: Insufficient documentation

## 2012-01-16 DIAGNOSIS — Z01810 Encounter for preprocedural cardiovascular examination: Secondary | ICD-10-CM

## 2012-01-16 DIAGNOSIS — F17200 Nicotine dependence, unspecified, uncomplicated: Secondary | ICD-10-CM

## 2012-01-16 NOTE — Assessment & Plan Note (Signed)
Counseled for less than 10 minutes  Lozengers may help  Will pick up at [employer].  F/U Dr Gerda Diss  Encouraged her to quit before surgery

## 2012-01-16 NOTE — Assessment & Plan Note (Signed)
Well controlled.  Continue current medications and low sodium Dash type diet.    

## 2012-01-16 NOTE — Assessment & Plan Note (Signed)
Discussed low carb diet.  Target hemoglobin A1c is 6.5 or less.  Continue current medications.  

## 2012-01-16 NOTE — Assessment & Plan Note (Signed)
Smoker with abnormal ECG and chest pain.  ? History of MI  Needs thoracotomy for ? Lung CA.  Will schedule lexiscan myovue and clear for surgery if normal.

## 2012-01-16 NOTE — Progress Notes (Signed)
Patient ID: DHANVI BOESEN, female   DOB: 1960-09-03, 51 y.o.   MRN: [id number] 51 yo with mental retardation.  Smoker.  Lung nodule and contemplating lobectomy and throacotomy with Dr Tyrone Sage.  Describes episode of chest pain While working at [employer] in [place] had chet pain and went to [employer].  Stayed a day or two.  No cath.  Not clear that she really had and MI.  Gets some Atypical SSCP Some with ambulation. Some at rest. Points to left breast.  Still smoking counseled for less than 10 minutes on cessation and thinks lozengers would be hellpful. Also gets mild exertional dyspnea.  ECG has nonspecfic ST/T wave changes.  CRF smoking and HTN on Rx  ROS: Denies fever, malais, weight loss, blurry vision, decreased visual acuity, cough, sputum, SOB, hemoptysis, pleuritic pain, palpitaitons, heartburn, abdominal pain, melena, lower extremity edema, claudication, or rash.  All other systems reviewed and negative   General: Affect appropriate Overweight black female HEENT: normal Neck supple with no adenopathy JVP normal no bruits no thyromegaly Lungs clear with no wheezing and good diaphragmatic motion Heart:  S1/S2 no murmur,rub, gallop or click PMI normal Abdomen: benighn, BS positve, no tenderness, no AAA no bruit.  No HSM or HJR Distal pulses intact with no bruits No edema Neuro non-focal Skin warm and dry No muscular weakness  Medications Current Outpatient Prescriptions  Medication Sig Dispense Refill  . ALPRAZolam (XANAX) 0.25 MG tablet Take 0.25 mg by mouth 2 (two) times daily as needed. For anxiety      . atomoxetine (STRATTERA) 100 MG capsule Take 100 mg by mouth daily.        Marland Kitchen buPROPion (WELLBUTRIN SR) 150 MG 12 hr tablet Take 150 mg by mouth 2 (two) times daily.       . calcium-vitamin D (OSCAL WITH D) 500-200 MG-UNIT per tablet Take 1 tablet by mouth 3 (three) times daily.        . furosemide (LASIX) 40 MG tablet Take 80 mg by mouth daily.        . hydrOXYzine (ATARAX)  50 MG tablet Take 50 mg by mouth daily.        Marland Kitchen lamoTRIgine (LAMICTAL) 100 MG tablet Take 100 mg by mouth daily.        Marland Kitchen lisinopril (PRINIVIL,ZESTRIL) 5 MG tablet Take 5 mg by mouth daily.        . mirtazapine (REMERON) 30 MG tablet Take 30 mg by mouth at bedtime.        . nabumetone (RELAFEN) 500 MG tablet Take 500 mg by mouth 2 (two) times daily.        Marland Kitchen NEXIUM 40 MG capsule TAKE 1 CAPSULE BY MOUTH TWICE DAILY. 30 MINUTES BEFORE BREAKFAST & BEFORE DINNER.  60 each  5  . Olopatadine HCl (PATADAY) 0.2 % SOLN Apply to eye.      . paliperidone (INVEGA) 9 MG 24 hr tablet Take 9 mg by mouth every morning.        . potassium chloride (KLOR-CON) 10 MEQ CR tablet Take 10 mEq by mouth daily.        . Probiotic Product (ALIGN) 4 MG CAPS Take 4 mg by mouth daily.  21 capsule  0  . sertraline (ZOLOFT) 100 MG tablet Take 200 mg by mouth daily.        Marland Kitchen torsemide (DEMADEX) 20 MG tablet Take 20 mg by mouth daily.      . traZODone (DESYREL) 100 MG tablet  Allergies Acetaminophen; Aspirin; Aspirin-acetaminophen-caffeine; Nsaids; Penicillins; and Thorazine  Family History: Family History  Problem Relation Age of Onset  . Colon cancer      aunt  . Liver disease Neg Hx   . Inflammatory bowel disease Neg Hx   . Stroke Mother   . Heart attack Father     Social History: History   Social History  . Marital Status: Divorced    Spouse Name: N/A    Number of Children: N/A  . Years of Education: N/A   Occupational History  . disabled    Social History Main Topics  . Smoking status: Current Everyday Smoker -- 0.2 packs/day for 40 years    Types: Cigarettes  . Smokeless tobacco: Never Used  . Alcohol Use: No     social drinker  . Drug Use: No  . Sexually Active: Yes -- Female partner(s)    Birth Control/ Protection: None     boyfriend   Other Topics Concern  . Not on file   Social History Narrative  . No narrative on file    Electrocardiogram:  NSR rate 77 nonspecifc ST/T  wave changes inferolateral leads  Assessment and Plan

## 2012-01-16 NOTE — Addendum Note (Signed)
**Note De-Identified Jaylan Duggar Obfuscation** Addended by: Demetrios Loll on: 01/16/2012 02:50 PM   Modules accepted: Orders

## 2012-01-16 NOTE — Patient Instructions (Addendum)
  Your physician has requested that you have a lexiscan myoview. For further information please visit [email or web address] Please follow instruction sheet, as given.  Your physician recommends that you schedule a follow-up appointment in: as needed

## 2012-01-17 ENCOUNTER — Other Ambulatory Visit: Payer: Self-pay

## 2012-01-17 ENCOUNTER — Encounter (HOSPITAL_COMMUNITY): Payer: Self-pay | Admitting: *Deleted

## 2012-01-17 ENCOUNTER — Emergency Department (HOSPITAL_COMMUNITY)
Admission: EM | Admit: 2012-01-17 | Discharge: 2012-01-17 | Disposition: A | Discharge status: Home / Self Care | Payer: PRIVATE HEALTH INSURANCE | Attending: Emergency Medicine | Admitting: Emergency Medicine

## 2012-01-17 ENCOUNTER — Ambulatory Visit (HOSPITAL_COMMUNITY)
Admission: RE | Admit: 2012-01-17 | Discharge: 2012-01-17 | Disposition: A | Discharge status: Home / Self Care | Payer: PRIVATE HEALTH INSURANCE | Source: Ambulatory Visit | Attending: Cardiothoracic Surgery | Admitting: Cardiothoracic Surgery

## 2012-01-17 ENCOUNTER — Emergency Department (HOSPITAL_COMMUNITY): Payer: PRIVATE HEALTH INSURANCE

## 2012-01-17 DIAGNOSIS — F17200 Nicotine dependence, unspecified, uncomplicated: Secondary | ICD-10-CM | POA: Insufficient documentation

## 2012-01-17 DIAGNOSIS — F79 Unspecified intellectual disabilities: Secondary | ICD-10-CM | POA: Insufficient documentation

## 2012-01-17 DIAGNOSIS — E119 Type 2 diabetes mellitus without complications: Secondary | ICD-10-CM | POA: Insufficient documentation

## 2012-01-17 DIAGNOSIS — Z79899 Other long term (current) drug therapy: Secondary | ICD-10-CM | POA: Insufficient documentation

## 2012-01-17 DIAGNOSIS — R911 Solitary pulmonary nodule: Secondary | ICD-10-CM | POA: Insufficient documentation

## 2012-01-17 DIAGNOSIS — I10 Essential (primary) hypertension: Secondary | ICD-10-CM | POA: Insufficient documentation

## 2012-01-17 DIAGNOSIS — R05 Cough: Secondary | ICD-10-CM | POA: Insufficient documentation

## 2012-01-17 DIAGNOSIS — R059 Cough, unspecified: Secondary | ICD-10-CM | POA: Insufficient documentation

## 2012-01-17 DIAGNOSIS — K589 Irritable bowel syndrome without diarrhea: Secondary | ICD-10-CM | POA: Insufficient documentation

## 2012-01-17 DIAGNOSIS — R079 Chest pain, unspecified: Secondary | ICD-10-CM

## 2012-01-17 LAB — BASIC METABOLIC PANEL
Calcium: 9.2 mg/dL (ref 8.4–10.5)
Creatinine, Ser: 0.78 mg/dL (ref 0.50–1.10)
GFR calc non Af Amer: >90 mL/min (ref >90)
Glucose, Bld: 85 mg/dL (ref 70–99)
Sodium: 134 mEq/L — ABNORMAL LOW (ref 135–145)

## 2012-01-17 LAB — CARDIAC PANEL(CRET KIN+CKTOT+MB+TROPI)
CK, MB: 4.4 ng/mL — ABNORMAL HIGH (ref 0.3–4.0)
Relative Index: 0.9 (ref 0.0–2.5)
Troponin I: <0.3 ng/mL (ref <0.30)

## 2012-01-17 LAB — CK TOTAL AND CKMB (NOT AT ARMC): Total CK: 478 U/L — ABNORMAL HIGH (ref 7–177)

## 2012-01-17 LAB — CBC WITH DIFFERENTIAL/PLATELET
Basophils Absolute: 0 10*3/uL (ref 0.0–0.1)
Eosinophils Absolute: 0.1 10*3/uL (ref 0.0–0.7)
Eosinophils Relative: 2 % (ref 0–5)
MCH: 30.3 pg (ref 26.0–34.0)
MCV: 87.5 fL (ref 78.0–100.0)
Platelets: 186 10*3/uL (ref 150–400)
RDW: 13.6 % (ref 11.5–15.5)

## 2012-01-17 LAB — PHOSPHORUS: Phosphorus: 3.6 mg/dL (ref 2.3–4.6)

## 2012-01-17 MED ORDER — ALBUTEROL SULFATE (5 MG/ML) 0.5% IN NEBU: 2.5000 mg | INHALATION_SOLUTION | Freq: Once | RESPIRATORY_TRACT | Status: DC

## 2012-01-17 NOTE — ED Notes (Signed)
Pt was having a pulmonary function test and began having cp, was brought to er staff.

## 2012-01-17 NOTE — ED Provider Notes (Signed)
History   This chart was scribed for Denise Octave, MD by Shari Heritage. The patient was seen in room APA19/AP[facility]19. Patient's care was started at 1410.     CSN: [id number]  Arrival date & time 01/17/12  1410   First MD Initiated Contact with Patient 01/17/12 1501      Chief Complaint  Patient presents with  . Chest Pain    (Consider location/radiation/quality/duration/timing/severity/associated sxs/prior treatment) Patient is a 51 y.o. female presenting with chest pain. The history is provided by the patient. No language interpreter was used.  Chest Pain Chest pain occurs constantly. The chest pain is unchanged. The pain is associated with exertion. The severity of the pain is severe. The quality of the pain is described as dull. Radiates to: Neck. Chest pain is worsened by exertion. Primary symptoms include nausea and vomiting. Pertinent negatives for primary symptoms include no fever and no wheezing.  Pertinent negatives for associated symptoms include no claudication, no diaphoresis, no lower extremity edema, no near-syncope, no numbness, no orthopnea, no paroxysmal nocturnal dyspnea and no weakness. Risk factors include smoking/tobacco exposure.  Her past medical history is significant for MI.    [name] is a 51 y.o. female who presents to the Emergency Department complaining of recurrent chest pain onset several hours ago. Patient says that pain radiates up to her neck and is worse with palpation. Patient says that the she experiences pain almost every day which worsens with exertion (specifically while walking). Associated symptoms include nausea and vomiting (2x this morning). Patient reports no other symptoms. Patient says that when the pain started, she was in the middle of a pulmonary function test yesterday. Patient with h/o MI. Patient denies stents in heart. Patient has never had a stress test, reports that she has one scheduled for July 23. Patient also has h/o of  diabetes, HTN, mental retardation, migraines, hyperglycemia, IBS and lung nodules. Patient with surgical h/o abdominal hysterectomy, foot surgery, esophagogastroduodenoscopy, colonoscopy (2x). Patient is a current everyday smoker (0.25 packs/day for 40 years).  Past Medical History  Diagnosis Date  . Mental retardation   . Hyperglycemia   . HTN (hypertension)   . Migraines   . Anxiety   . IBS (irritable bowel syndrome)   . Diabetes mellitus   . Lung nodules     right, followed by PCP, PET 11/2011    Past Surgical History  Procedure Date  . Abdominal hysterectomy   . Foot surgery   . External ear surgery     bilateral  . Glaucoma surgery   . Colonoscopy 11/2007    hyperplastic polyps, prior hx of adenomas   . Small bowel capsule 10/2007    normal  . Esophagogastroduodenoscopy 08/2007    moderate sized hiatal hernia  . Colonoscopy 05/2010    incomplete due to poor prep, hyperplastic rectal polyp  . Esophagogastroduodenoscopy 05/2010    noncritical schatzki ring s/p 53F  . Colonoscopy 05/05/2002    Dimunitive polyps in the rectum and left colon, cold    biopsied/removed.  Scattered few left-sided diverticula.  Regular colonic   mucosa appeared normal    Family History  Problem Relation Age of Onset  . Colon cancer      aunt  . Liver disease Neg Hx   . Inflammatory bowel disease Neg Hx   . Stroke Mother   . Heart attack Father     History  Substance Use Topics  . Smoking status: Current Everyday Smoker -- 0.2 packs/day for 40  years    Types: Cigarettes  . Smokeless tobacco: Never Used  . Alcohol Use: No     social drinker    OB History    Grav Para Term Preterm Abortions TAB SAB Ect Mult Living                  Review of Systems  Constitutional: Negative for fever and diaphoresis.  Respiratory: Negative for wheezing.   Cardiovascular: Positive for chest pain. Negative for orthopnea, claudication and near-syncope.  Gastrointestinal: Positive for nausea and  vomiting.  Neurological: Negative for weakness and numbness.  All other systems reviewed and are negative.    Allergies  Thorazine; Acetaminophen; Aspirin; Aspirin-acetaminophen-caffeine; Nsaids; Penicillins; and Tomato  Home Medications   Current Outpatient Rx  Name Route Sig Dispense Refill  . ALPRAZOLAM 0.25 MG PO TABS Oral Take 0.25 mg by mouth 2 (two) times daily as needed. For anxiety    . ATOMOXETINE HCL 100 MG PO CAPS Oral Take 100 mg by mouth daily.      . BUPROPION HCL ER (SR) 150 MG PO TB12 Oral Take 150 mg by mouth 2 (two) times daily.     Marland Kitchen CALCIUM CARBONATE-VITAMIN D 500-200 MG-UNIT PO TABS Oral Take 1 tablet by mouth 3 (three) times daily.      Marland Kitchen ESOMEPRAZOLE MAGNESIUM 40 MG PO CPDR Oral Take 40 mg by mouth 2 (two) times daily before a meal.    . FUROSEMIDE 40 MG PO TABS Oral Take 80 mg by mouth daily.      Marland Kitchen HYDROXYZINE HCL 50 MG PO TABS Oral Take 50 mg by mouth daily.      Marland Kitchen LAMOTRIGINE 100 MG PO TABS Oral Take 100 mg by mouth daily.      Marland Kitchen LISINOPRIL 5 MG PO TABS Oral Take 5 mg by mouth daily.      Marland Kitchen MIRTAZAPINE 30 MG PO TABS Oral Take 30 mg by mouth at bedtime.      Marland Kitchen NABUMETONE 500 MG PO TABS Oral Take 500 mg by mouth 2 (two) times daily.      . OLOPATADINE HCL 0.2 % OP SOLN Both Eyes Place 4 drops into both eyes 3 (three) times daily.     Marland Kitchen PALIPERIDONE ER 9 MG PO TB24 Oral Take 9 mg by mouth every morning.      Marland Kitchen POTASSIUM CHLORIDE 10 MEQ PO TBCR Oral Take 10 mEq by mouth daily.      Marland Kitchen ALIGN 4 MG PO CAPS Oral Take 4 mg by mouth daily.    . SERTRALINE HCL 100 MG PO TABS Oral Take 200 mg by mouth daily.      . TORSEMIDE 20 MG PO TABS Oral Take 20 mg by mouth daily.    . TRAZODONE HCL 100 MG PO TABS Oral Take 100 mg by mouth at bedtime.       BP 129/78  Pulse 70  Temp 98.1 F (36.7 C) (Oral)  Resp 20  SpO2 100%  Physical Exam  Nursing note and vitals reviewed. Constitutional: She is oriented to person, place, and time. She appears well-developed and  well-nourished.  HENT:  Head: Normocephalic and atraumatic.  Eyes: Conjunctivae and EOM are normal. Pupils are equal, round, and reactive to light.  Neck: Normal range of motion. Neck supple.  Cardiovascular: Normal rate, regular rhythm and normal heart sounds.   No murmur heard. Pulmonary/Chest: Effort normal and breath sounds normal. No respiratory distress. She has no wheezes.  Reproducible left-sided chest pain. +2 pedal edema.   Abdominal: Soft. Bowel sounds are normal. There is no tenderness.  Musculoskeletal: Normal range of motion.  Neurological: She is alert and oriented to person, place, and time. No cranial nerve deficit.  Skin: Skin is warm and dry.  Psychiatric: She has a normal mood and affect.    ED Course  Procedures (including critical care time) DIAGNOSTIC STUDIES: Oxygen Saturation is 100% on room air, normal by my interpretation.    COORDINATION OF CARE: 3:04PM- Patient informed of current plan for treatment and evaluation and agrees with plan at this time.   4:11PM- Performed consult with Dr. Johney Frame. Patient's case was explained and discussed.  4:22PM- Updated patient on treatment plan. Patient is comfortable.  Results for orders placed during the hospital encounter of 01/17/12  CBC WITH DIFFERENTIAL      Component Value Range   WBC 3.9 (*) 4.0 - 10.5 K/uL   RBC 4.39  3.87 - 5.11 MIL/uL   Hemoglobin 13.3  12.0 - 15.0 g/dL   HCT 16.1  09.6 - 04.5 %   MCV 87.5  78.0 - 100.0 fL   MCH 30.3  26.0 - 34.0 pg   MCHC 34.6  30.0 - 36.0 g/dL   RDW 40.9  81.1 - 91.4 %   Platelets 186  150 - 400 K/uL   Neutrophils Relative 36 (*) 43 - 77 %   Neutro Abs 1.4 (*) 1.7 - 7.7 K/uL   Lymphocytes Relative 55 (*) 12 - 46 %   Lymphs Abs 2.1  0.7 - 4.0 K/uL   Monocytes Relative 8  3 - 12 %   Monocytes Absolute 0.3  0.1 - 1.0 K/uL   Eosinophils Relative 2  0 - 5 %   Eosinophils Absolute 0.1  0.0 - 0.7 K/uL   Basophils Relative 1  0 - 1 %   Basophils Absolute 0.0  0.0  - 0.1 K/uL  BASIC METABOLIC PANEL      Component Value Range   Sodium 134 (*) 135 - 145 mEq/L   Potassium 3.2 (*) 3.5 - 5.1 mEq/L   Chloride 93 (*) 96 - 112 mEq/L   CO2 33 (*) 19 - 32 mEq/L   Glucose, Bld 85  70 - 99 mg/dL   BUN 7  6 - 23 mg/dL   Creatinine, Ser 7.82  0.50 - 1.10 mg/dL   Calcium 9.2  8.4 - 95.6 mg/dL   GFR calc non Af Amer >90  >90 mL/min   GFR calc Af Amer >90  >90 mL/min  TROPONIN I      Component Value Range   Troponin I <0.30  <0.30 ng/mL  CK TOTAL AND CKMB      Component Value Range   Total CK 478 (*) 7 - 177 U/L   CK, MB 4.4 (*) 0.3 - 4.0 ng/mL   Relative Index 0.9  0.0 - 2.5  MAGNESIUM      Component Value Range   Magnesium 1.5  1.5 - 2.5 mg/dL  PHOSPHORUS      Component Value Range   Phosphorus 3.6  2.3 - 4.6 mg/dL   Dg Chest Portable 1 View  01/17/2012  *RADIOLOGY REPORT*  Clinical Data: Smoker, cough, chest pain, history hypertension and diabetes  PORTABLE CHEST - 1 VIEW  Comparison: Portable exam 1448 hours compared to 10/24/2011  Findings: Normal heart size, mediastinal contours, and pulmonary vascularity. Lungs clear. No pleural effusion or pneumothorax. Bones unremarkable.  IMPRESSION: No acute  abnormalities.  Original Report Authenticated By: Lollie Marrow, M.D.     No diagnosis found.    MDM  Patient complains of left-sided chest pain it radiates to her neck that onset while she was having a pulmonary function test. His been having the same type of pain daily for months. It is reproducible to palpation. She saw Dr. Eden Emms yesterday and was scheduled for an outpatient stress test on July 23.  Initial QT interval 556, on repeat 516, then 492.  Several months of substernal chest pain on the left side that radiates to the neck. Somewhat worse with exertion, worse with palpation. Seen by cardiology yesterday.  Atypical for ACS.  No tachycardia, tachypnea or hypoxia to suggest PE.  Case discussed with Dr. Johney Frame who is on call for Dr. Eden Emms,  including reproducible nature of the pain, nonspecific EKG, prolonged QT. He agrees with serial enzymes and EKGs in the department with outpatient followup. Spontaneous improvement in QTc on serial EKGs. Delta troponin negative. Improved QTc on EKG.  Chest pain improved, still reproducible to palpation. Stable for outpatient stress test with cardiology as scheduled.   Date: 01/17/2012 1839  Rate: 70  Rhythm: normal sinus rhythm  QRS Axis: normal  Intervals: QT prolonged  ST/T Wave abnormalities: nonspecific ST/T changes  Conduction Disutrbances:none  Narrative Interpretation: Nonspecific anterolateral T-wave changes unchanged  Old EKG Reviewed: unchanged    Date: 01/17/2012 1417  Rate: 75  Rhythm: normal sinus rhythm  QRS Axis: normal  Intervals: QT prolonged  ST/T Wave abnormalities: nonspecific ST/T changes  Conduction Disutrbances:none  Narrative Interpretation: T-wave flattening laterally  Old EKG Reviewed: unchanged   Date: 01/17/2012 1515  Rate: 70  Rhythm: normal sinus rhythm  QRS Axis: normal  Intervals: prolonged QT  ST/T Wave abnormalities: nonspecific ST/T changes  Conduction Disutrbances:none  Narrative Interpretation: prolonged QT  Old EKG Reviewed: changes noted     I personally performed the services described in this documentation, which was scribed in my presence.  The recorded information has been reviewed and considered.    Denise Octave, MD 01/17/12 1940

## 2012-01-19 NOTE — Procedures (Signed)
**Note [name] via Obfuscation** [name], [name]                ACCOUNT NO.:  [id number]  MEDICAL RECORD NO.:  [id number]  LOCATION:                                 FACILITY:  PHYSICIAN:  Kenard Morawski L. Juanetta Gosling, M.D.DATE OF BIRTH:  [birth year]  DATE OF PROCEDURE:  01/18/2012 DATE OF DISCHARGE:                           PULMONARY FUNCTION TEST   Reason for pulmonary function testing is lung nodule. 1. Spirometry shows a severe ventilatory defect with evidence of     airflow obstruction.  Please note that, the patient was unable to     complete the pulmonary function test and was sent to the emergency     department because of the development of chest and joint pain.     Emanuell Morina L. Juanetta Gosling, M.D.     [facility]/MEDQ  D:  01/18/2012  T:  01/18/2012  Job:  [id number]

## 2012-01-23 LAB — PULMONARY FUNCTION TEST

## 2012-01-26 ENCOUNTER — Other Ambulatory Visit: Payer: Self-pay | Admitting: *Deleted

## 2012-01-26 DIAGNOSIS — R079 Chest pain, unspecified: Secondary | ICD-10-CM

## 2012-01-30 ENCOUNTER — Encounter (HOSPITAL_COMMUNITY): Payer: PRIVATE HEALTH INSURANCE

## 2012-01-31 ENCOUNTER — Encounter (HOSPITAL_COMMUNITY): Payer: PRIVATE HEALTH INSURANCE

## 2012-01-31 ENCOUNTER — Ambulatory Visit (HOSPITAL_COMMUNITY): Payer: PRIVATE HEALTH INSURANCE

## 2012-02-15 ENCOUNTER — Encounter (HOSPITAL_COMMUNITY): Payer: Self-pay

## 2012-02-15 ENCOUNTER — Encounter (HOSPITAL_COMMUNITY)
Admission: RE | Admit: 2012-02-15 | Discharge: 2012-02-15 | Disposition: A | Discharge status: Home / Self Care | Payer: PRIVATE HEALTH INSURANCE | Source: Ambulatory Visit | Attending: Cardiovascular Disease | Admitting: Cardiovascular Disease

## 2012-02-15 ENCOUNTER — Other Ambulatory Visit: Payer: Self-pay | Admitting: Family Medicine

## 2012-02-15 ENCOUNTER — Ambulatory Visit (INDEPENDENT_AMBULATORY_CARE_PROVIDER_SITE_OTHER): Payer: PRIVATE HEALTH INSURANCE

## 2012-02-15 ENCOUNTER — Encounter (HOSPITAL_COMMUNITY): Payer: Self-pay | Admitting: Cardiology

## 2012-02-15 DIAGNOSIS — R0602 Shortness of breath: Secondary | ICD-10-CM

## 2012-02-15 DIAGNOSIS — F17200 Nicotine dependence, unspecified, uncomplicated: Secondary | ICD-10-CM | POA: Insufficient documentation

## 2012-02-15 DIAGNOSIS — Z01810 Encounter for preprocedural cardiovascular examination: Secondary | ICD-10-CM

## 2012-02-15 DIAGNOSIS — R079 Chest pain, unspecified: Secondary | ICD-10-CM | POA: Insufficient documentation

## 2012-02-15 DIAGNOSIS — Z09 Encounter for follow-up examination after completed treatment for conditions other than malignant neoplasm: Secondary | ICD-10-CM

## 2012-02-15 DIAGNOSIS — I10 Essential (primary) hypertension: Secondary | ICD-10-CM | POA: Insufficient documentation

## 2012-02-15 DIAGNOSIS — E119 Type 2 diabetes mellitus without complications: Secondary | ICD-10-CM

## 2012-02-15 MED ORDER — TECHNETIUM TC 99M TETROFOSMIN IV KIT
10.0000 | PACK | Freq: Once | INTRAVENOUS | Status: AC | PRN
  Administered 2012-02-15: 9.5 via INTRAVENOUS

## 2012-02-15 MED ORDER — TECHNETIUM TC 99M TETROFOSMIN IV KIT
30.0000 | PACK | Freq: Once | INTRAVENOUS | Status: AC | PRN
  Administered 2012-02-15: 29.4 via INTRAVENOUS

## 2012-02-15 NOTE — Progress Notes (Signed)
Stress Lab Nurses Notes - [name]  [name] 02/15/2012 Reason for doing test: Surgical Clearance Type of test: [name] Nurse performing test: [name], RN Nuclear Medicine Tech: [name] Echo Tech: Not Applicable MD performing test: [name] & [name] NP Family MD: [name] Test explained and consent signed: yes IV started: 22g jelco, Saline lock flushed, No redness or edema and Saline lock started in radiology Symptoms: Stomach discomfort Treatment/Intervention: None Reason test stopped: protocol completed After recovery IV was: Discontinued via X-ray tech and No redness or edema Patient to return to Nuc. Med at : 12:00 Patient discharged: Home Patient's Condition upon discharge was: stable Comments: During test BP 108/63 & HR 87.  Recovery BP 101/62 & HR 82.  Symptoms resolved in recovery. [name]

## 2012-02-21 ENCOUNTER — Ambulatory Visit (HOSPITAL_COMMUNITY)
Admission: RE | Admit: 2012-02-21 | Discharge: 2012-02-21 | Disposition: A | Discharge status: Home / Self Care | Payer: PRIVATE HEALTH INSURANCE | Source: Ambulatory Visit | Attending: Family Medicine | Admitting: Family Medicine

## 2012-02-21 DIAGNOSIS — Z09 Encounter for follow-up examination after completed treatment for conditions other than malignant neoplasm: Secondary | ICD-10-CM | POA: Insufficient documentation

## 2012-02-21 DIAGNOSIS — N63 Unspecified lump in unspecified breast: Secondary | ICD-10-CM | POA: Insufficient documentation

## 2012-03-22 ENCOUNTER — Other Ambulatory Visit: Payer: Self-pay | Admitting: Thoracic Surgery (Cardiothoracic Vascular Surgery)

## 2012-03-22 DIAGNOSIS — R911 Solitary pulmonary nodule: Secondary | ICD-10-CM

## 2012-04-22 ENCOUNTER — Ambulatory Visit (HOSPITAL_COMMUNITY)
Admission: RE | Admit: 2012-04-22 | Discharge: 2012-04-22 | Disposition: A | Discharge status: Home / Self Care | Payer: PRIVATE HEALTH INSURANCE | Source: Ambulatory Visit | Attending: Thoracic Surgery (Cardiothoracic Vascular Surgery) | Admitting: Thoracic Surgery (Cardiothoracic Vascular Surgery)

## 2012-04-22 ENCOUNTER — Other Ambulatory Visit: Payer: Self-pay | Admitting: Gastroenterology

## 2012-04-22 DIAGNOSIS — J984 Other disorders of lung: Secondary | ICD-10-CM | POA: Insufficient documentation

## 2012-04-22 DIAGNOSIS — Z09 Encounter for follow-up examination after completed treatment for conditions other than malignant neoplasm: Secondary | ICD-10-CM | POA: Insufficient documentation

## 2012-04-22 DIAGNOSIS — R911 Solitary pulmonary nodule: Secondary | ICD-10-CM

## 2012-04-22 LAB — POCT I-STAT, CHEM 8
Calcium, Ion: 1.1 mmol/L — ABNORMAL LOW (ref 1.12–1.23)
HCT: 41 % (ref 36.0–46.0)
TCO2: 29 mmol/L (ref 0–100)

## 2012-04-22 MED ORDER — IOHEXOL 300 MG/ML  SOLN
80.0000 mL | Freq: Once | INTRAMUSCULAR | Status: AC | PRN
  Administered 2012-04-22: 80 mL via INTRAVENOUS

## 2012-04-22 NOTE — Progress Notes (Signed)
Blood sample obtained from right arm IV for Creatnine level.  

## 2012-04-23 ENCOUNTER — Ambulatory Visit: Payer: PRIVATE HEALTH INSURANCE | Admitting: Thoracic Surgery (Cardiothoracic Vascular Surgery)

## 2012-04-25 ENCOUNTER — Other Ambulatory Visit: Payer: Self-pay | Admitting: Gastroenterology

## 2012-05-09 ENCOUNTER — Ambulatory Visit (INDEPENDENT_AMBULATORY_CARE_PROVIDER_SITE_OTHER): Payer: PRIVATE HEALTH INSURANCE | Admitting: Cardiothoracic Surgery

## 2012-05-09 ENCOUNTER — Encounter: Payer: Self-pay | Admitting: Cardiothoracic Surgery

## 2012-05-09 VITALS — BP 124/76 | HR 87 | Resp 18 | Ht 70.5 in | Wt 345.0 lb

## 2012-05-09 DIAGNOSIS — R911 Solitary pulmonary nodule: Secondary | ICD-10-CM

## 2012-05-09 NOTE — Progress Notes (Signed)
[address]            [place] [address]          [phone]       HAJRA PORT [facility] Medical Record #[id number] Date of Birth: 01-Apr-1961  Referring: Babs Sciara, MD Primary Care: [name], MD  Chief Complaint:    Lung Nodule   History of Present Illness:    Patient is a 51 year old female with long-term history of smoking who had the incidental finding of the 8mm nodule in the right middle lobe. She is referred to thoracic surgery for evaluation of this and to develop a treatment plan. She's had no previous history of lung cancer. She does have a history of limited mental capacity and comes to the office today with her social worker from [place]. She's had no history of tuberculosis or known tuberculosis exposure.  She does give a history of having a myocardial infarction in being hospitalized for 5 days at age 25 no other details about this revealed. She does note that at times especially with exertion she feels a pounding heart and chest discomfort with exertional shortness of breath. She denies resting shortness of breath.  She does give a history of having a right-sided stroke 5 years ago symptoms of this have resolved.   Since last seen has cut back to smoking 4 cig a day Was unable to do full pfts   Current Activity/ Functional Status: Patient Patient Patient is independent with mobility/ambulation, transfers, ADL's, IADL's. The patient does level and but has assistance of a Child psychotherapist because of her limited mental capacity.      Past Medical History  Diagnosis Date  . Mental retardation   . Hyperglycemia   . HTN (hypertension)   . Migraines   . Anxiety   . IBS (irritable bowel syndrome)   . Diabetes mellitus   . Lung nodules     right, followed by PCP, PET 11/2011    Past Surgical History  Procedure Date  . Abdominal hysterectomy   . Foot surgery   . External ear surgery     bilateral  . Glaucoma surgery   . Colonoscopy 11/2007    hyperplastic polyps, prior hx of adenomas   . Small bowel capsule 10/2007    normal  . Esophagogastroduodenoscopy 08/2007    moderate sized hiatal hernia  . Colonoscopy 05/2010    incomplete due to poor prep, hyperplastic rectal polyp  . Esophagogastroduodenoscopy 05/2010    noncritical schatzki ring s/p 35F  . Colonoscopy 05/05/2002    Dimunitive polyps in the rectum and left colon, cold    biopsied/removed.  Scattered few left-sided diverticula.  Regular colonic   mucosa appeared normal    Family History  Problem Relation Age of Onset  . Colon cancer      aunt  . Liver disease Neg Hx   . Inflammatory bowel disease Neg Hx   . Stroke Mother   . Heart attack Father       History  Smoking status  . Current Every Day Smoker -- 0.2 packs/day for 40 years  . Types: Cigarettes  Smokeless tobacco  . Never Used    History  Alcohol Use No   Says does not drink alcohol  Allergies  Allergen Reactions  . Thorazine (Chlorpromazine Hcl) Anaphylaxis  . Acetaminophen Other (See Comments)    Makes pt dizzy  . Aspirin Other (See Comments)    seizure  . Aspirin-Acetaminophen-Caffeine Other (See Comments)    seizure  . Nsaids Nausea And Vomiting  . Penicillins Nausea And Vomiting  . Tomato Rash    Current Outpatient Prescriptions  Medication Sig Dispense Refill  . ALPRAZolam (XANAX) 0.25 MG tablet Take 0.25 mg by mouth 2 (two) times daily as needed. For anxiety      . atomoxetine (STRATTERA) 100 MG capsule Take 100 mg by mouth daily.        Marland Kitchen buPROPion (WELLBUTRIN SR) 150 MG 12 hr tablet Take 150 mg by mouth 2 (two) times daily.       . calcium-vitamin D (OSCAL WITH D) 500-200 MG-UNIT per tablet Take 1 tablet by mouth 3 (three) times daily.        . furosemide (LASIX) 40 MG tablet Take 80 mg by mouth daily.        . hydrOXYzine (ATARAX) 50 MG tablet Take 50 mg by mouth daily.        Marland Kitchen lamoTRIgine (LAMICTAL) 100 MG  tablet Take 100 mg by mouth daily.        Marland Kitchen lisinopril (PRINIVIL,ZESTRIL) 5 MG tablet Take 5 mg by mouth daily.        . mirtazapine (REMERON) 30 MG tablet Take 30 mg by mouth at bedtime.        . nabumetone (RELAFEN) 500 MG tablet Take 500 mg by mouth 2 (two) times daily.        Marland Kitchen NEXIUM 40 MG capsule TAKE 1 CAPSULE BY MOUTH TWICE DAILY. 30 MINUTES BEFORE BREAKFAST & BEFORE DINNER.  60 capsule  5  . Olopatadine HCl (PATADAY) 0.2 % SOLN Place 4 drops into both eyes 3 (three) times daily.       . paliperidone (INVEGA) 9 MG 24 hr tablet Take 9 mg by mouth every morning.        . potassium chloride (KLOR-CON) 10 MEQ CR tablet Take 10 mEq by mouth daily.        . Probiotic Product (ALIGN) 4 MG CAPS Take 4 mg by mouth daily.      . sertraline (ZOLOFT) 100 MG tablet Take 200 mg by mouth daily.        Marland Kitchen torsemide (DEMADEX) 20 MG tablet Take 20 mg by mouth daily.      . traZODone (DESYREL) 100 MG tablet Take 100 mg by mouth at bedtime.            Review of Systems:     Cardiac Review of Systems: Y or N  Chest Pain [ y ]  Resting SOB [ n  ] Exertional SOB  Cove.Etienne ]  Pollyann Kennedy Milo.Brash ]   Pedal Edema Cove.Etienne   ]    Palpitations Cove.Etienne  ] Syncope  Cove.Etienne  ]   Presyncope [ n  ]  General Review of Systems: [Y] = yes [  ]=no Constitional: recent weight change [ y ]; anorexia [  ]; fatigue [  ]; nausea [  ]; night sweats [  ]; fever [  ]; or chills [  ];  Dental: poor dentition[y  ];  Eye : blurred vision [  ]; diplopia [   ]; vision changes [  ];  Amaurosis fugax[  ]; Resp: cough [  ];  wheezing[  ];  hemoptysis[  ]; shortness of breath[  ]; paroxysmal nocturnal dyspnea[  ]; dyspnea on exertion[  ]; or orthopnea[  ];  GI:  gallstones[  ], vomiting[  ];  dysphagia[  ]; melena[  ];  hematochezia [  ]; heartburn[  ];   Hx of  Colonoscopy[  ]; GU: kidney stones [  ]; hematuria[  ];    dysuria [  ];  nocturia[  ];  history of     obstruction [  ];             Skin: rash, swelling[  ];, hair loss[  ];  peripheral edema[  ];  or itching[  ]; Musculosketetal: myalgias[  ];  joint swelling[  ];  joint erythema[  ];  joint pain[  ];  back pain[  ];  Heme/Lymph: bruising[  ];  bleeding[  ];  anemia[  ];  Neuro: TIA[  ];  headaches[  ];  stroke[y  ];  vertigo[y  ];  seizures[y  ];   paresthesias[ y ];  difficulty walking[ y ];  Psych:depression[ y ]; anxiety[ y ];  Endocrine: diabetes[  ];  thyroid dysfunction[n  ];  Immunizations: Flu [ ? ]; Pneumococcal[?  ];  Other: Patient has a very positive review of systems including weight gain loss of appetite chest pain chest tightness shortness of breath and lying flat palpitation shortness of breath with exertion heart murmur atrial fibrillation arrhythmia productive cough bronchitis asthma wheezing abdominal pain constipation reflux trouble swallowing pain in her legs with walking pain feet when lying flat temporary blindness in one eye dizziness blackouts headaches muscle pain joint pain arthritis depression nervousness change in eyesight and change in hearing.  Physical Exam: .BP 124/76  Pulse 87  Resp 18  Ht 5' 10.5" (1.791 m)  Wt 345 lb (156.491 kg)  BMI 48.80 kg/m2  SpO2 96%   General appearance: alert, cooperative, appears older than stated age and no distress Neurologic: intact Heart: regular rate and rhythm, S1, S2 normal, no murmur, click, rub or gallop and normal apical impulse Lungs: clear to auscultation bilaterally and normal percussion bilaterally Abdomen: soft, non-tender; bowel sounds normal; no masses,  no organomegaly Extremities: edema mild bil pedal edema and Homans sign is negative, no sign of DVT Wound:  Patient has no carotid bruits no cervical supraclavicular adenopathy is appreciated, especially in the area noted on that scan in the right axilla   Diagnostic Studies & Laboratory data:     Recent  Radiology Findings:  Ct Chest W Contrast  04/22/2012  *RADIOLOGY REPORT*  Clinical Data: Follow-up pulmonary nodule.  CT CHEST WITH CONTRAST  Technique:  Multidetector CT imaging of the chest was performed following the standard protocol during bolus administration of intravenous contrast.  Contrast: 80mL OMNIPAQUE IOHEXOL 300 MG/ML  SOLN  Comparison: PET 11/29/2011 and CT chest 11/01/2011.  Findings: No pathologically enlarged mediastinal, hilar or axillary lymph nodes.  Heart size normal.  No pericardial effusion.  A nodule in the right middle lobe measures 10 x 7 mm, stable from 11/01/2011 when remeasured.  A tiny subpleural nodular density in the right lower lobe measures 3 mm (image 42), stable.  No pleural fluid.  Airway is unremarkable.  Incidental imaging of the upper abdomen shows low attenuation throughout  the visualized portion of the liver.  No worrisome lytic or sclerotic lesions.  IMPRESSION: Right middle lobe nodule is unchanged from 11/01/2011.  Given mild hypermetabolism on 11/29/2011, a follow-up interval of 6 months is recommended to confirm continued stability.   Original Report Authenticated By: Reyes Ivan, M.D.      11/09/2011 RADIOLOGY REPORT*  Clinical Data: Right basilar pulmonary nodule on abdominal CT.  Dysphagia. The patient has diabetes mellitus and unspecified  mental retardation.  CT CHEST WITH CONTRAST  Technique: Multidetector CT imaging of the chest was performed  following the standard protocol during bolus administration of  intravenous contrast.  Contrast: 80mL OMNIPAQUE IOHEXOL 300 MG/ML SOLN  Comparison: Abdominal CTs 10/10/2011 and 01/19/2005. Chest  radiographs 10/23/2001.  Findings: As demonstrated on the recent abdominal CT, there is a  lobulated well circumscribed 10 x 9 mm right middle lobe nodule on  image 35. This is noncalcified. This area of the lung bases was  not imaged on the 2006 CT. No other pulmonary nodules are  identified. There is  no airspace disease or confluent airspace  opacity.  There are no enlarged mediastinal or hilar lymph nodes. There is  no pleural or pericardial effusion. Minimal aortic atherosclerosis  is noted.  Images through the upper abdomen demonstrate diffuse hepatic  steatosis. There is no adrenal mass.  IMPRESSION:  1. Lobulated noncalcified 1-cm right middle lobe nodule was not  imaged on prior abdominal CT. This nodule is not conclusively  demonstrated on radiographs such that stability cannot be  addressed.  2. This nodule is nonspecific and could reflect a hamartoma or  other benign lesion. However, malignancy cannot be excluded.  3. No other nodules, adenopathy or acute findings.  This nodule would be difficult to biopsy given its close proximity  to the heart. Management options include PET CT to assess for  hypermetabolic activity and follow-up CT (depending on risk factors  for malignancy). If follow-up is elected, follow-up chest CT at 3  months is recommended. This recommendation is adapted from the  consensus statement: Guidelines for Management of Small Pulmonary  Nodules Detected on CT Scans: A Statement from the Fleischner  Society as published in Radiology 2005; 237:395-400.  Original Report Authenticated By: Gerrianne Scale, M.D.  Kary Kos, MD Tue Jan 09, 2012 2:52:39 PM EDT       **ADDENDUM** CREATED: 01/09/2012 14:43:25  Voice recognition errors:  Within the Findings section, the second sentence of the CHEST  section should read: "NO additional hypermetabolic nodules are  present."  Within the first IMPRESSION, the last phrase of the first sentence  should read: "for a nodule this SIZE."  Findings discussed with Dr. Tyrone Sage on 07/ 08/2011  **END ADDENDUM** SIGNED BY: Genevive Bi, M.D.      Study Result     *RADIOLOGY REPORT*  Clinical Data: Recent imaging demonstrates a solitary pulmonary  nodule. FDG PET CT requested to evaluate for possible  malignancy.  NUCLEAR MEDICINE PET SKULL BASE TO THIGH  Fasting Blood Glucose: 113  Technique: 18.4 mCi F-18 FDG was injected intravenously via the  right antecubital fossa. CT data was obtained and used for  attenuation correction and anatomic localization only. (This was  not acquired as a diagnostic CT examination.) Additional exam  technical data entered on technologist worksheet.  Comparison: CT 11/01/2011  Findings:  Head/Neck: No hypermetabolic lymph nodes in the neck.  Chest: Within the right middle lobe there is a 8 mm pulmonary  nodule adjacent to  the right atrium with mild metabolic activity (  SUV max = 2.5). Additional hypermetabolic nodules are present. No  hypermetabolic mediastinal lymph nodes.  There is a single hypermetabolic right axillary lymph node which is  normal pathology. There is misregistration in the right axilla due  to patient motion.  Abdomen/Pelvis: No abnormal hypermetabolic activity within the  liver, pancreas, adrenal glands, or spleen. No hypermetabolic  lymph nodes in the abdomen or pelvis.  Skeleton: No focal hypermetabolic activity to suggest skeletal  metastasis. Sclerosis of the left SI joint appears benign.  IMPRESSION:  1. Mildly hypermetabolic nodule within the right middle lobe.  Although the activity is mild, for a nodule this side this is  concerning although nonspecific. If biopsy is not feasible due to  location, recommend short-term follow-up CT with contrast in 1 to  3 months.  2. No evidence of metastasis.  3. Hypermetabolic right axial lymph node is likely reactive.  Original Report Authenticated By: Genevive Bi, M.D.       Recent Lab Findings: Lab Results  Component Value Date   WBC 3.9* 01/17/2012   HGB 13.9 04/22/2012   HCT 41.0 04/22/2012   PLT 186 01/17/2012   GLUCOSE 102* 04/22/2012   CHOL  Value: 147        ATP III CLASSIFICATION:  <200     mg/dL   Desirable  161-096  mg/dL   Borderline High  >=045    mg/dL    High 40/98/1191   TRIG 90 05/23/2008   HDL 51 05/23/2008   LDLCALC  Value: 78        Total Cholesterol/HDL:CHD Risk Coronary Heart Disease Risk Table                     Men   Women  1/2 Average Risk   3.4   3.3 05/23/2008   ALT 18 10/09/2011   AST 27 10/09/2011   NA 137 04/22/2012   K 3.3* 04/22/2012   CL 95* 04/22/2012   CREATININE 1.00 04/22/2012   BUN <3* 04/22/2012   CO2 33* 01/17/2012      Assessment / Plan:   9 mm right middle lobe lung nodule in a smoker abutting the right margin of the heart The size and location of this nodule makes it very difficult to biopsy percutaneously or with ENB . I discussed with her the treatment options and have recommended that we wait 5 months and repeat CT scan.  Patient . prefers to wait before proceeding with surgical resection., since have seen no change since 10/2011  I spent at least 15 minutes with her and her social worker discussing methods for her to stop smoking and the need to and options for treatment.  She did not complete pfts will try again   . Mental retardation   . Hyperglycemia   . HTN (hypertension)   . Migraines   . Anxiety   . IBS (irritable bowel syndrome)   . Diabetes mellitus   .               Delight Ovens MD  Beeper [phone] Office [phone] 05/09/2012 12:57 PM

## 2012-05-09 NOTE — Patient Instructions (Signed)
Return in 5 months for fu ct scan

## 2012-08-27 ENCOUNTER — Encounter (HOSPITAL_COMMUNITY): Payer: Self-pay | Admitting: Cardiology

## 2012-09-02 ENCOUNTER — Other Ambulatory Visit (HOSPITAL_COMMUNITY): Payer: Self-pay | Admitting: Family Medicine

## 2012-09-02 DIAGNOSIS — Z09 Encounter for follow-up examination after completed treatment for conditions other than malignant neoplasm: Secondary | ICD-10-CM

## 2012-09-11 ENCOUNTER — Ambulatory Visit (HOSPITAL_COMMUNITY)
Admission: RE | Admit: 2012-09-11 | Discharge: 2012-09-11 | Disposition: A | Discharge status: Home / Self Care | Payer: PRIVATE HEALTH INSURANCE | Source: Ambulatory Visit | Attending: Family Medicine | Admitting: Family Medicine

## 2012-09-11 DIAGNOSIS — N6489 Other specified disorders of breast: Secondary | ICD-10-CM | POA: Insufficient documentation

## 2012-09-11 DIAGNOSIS — Z09 Encounter for follow-up examination after completed treatment for conditions other than malignant neoplasm: Secondary | ICD-10-CM | POA: Insufficient documentation

## 2012-09-25 ENCOUNTER — Other Ambulatory Visit: Payer: Self-pay | Admitting: *Deleted

## 2012-09-25 NOTE — Telephone Encounter (Signed)
Pt is requesting tramadol and zpack, please clarify dosage and times

## 2012-09-27 ENCOUNTER — Other Ambulatory Visit: Payer: Self-pay | Admitting: *Deleted

## 2012-09-27 ENCOUNTER — Encounter: Payer: Self-pay | Admitting: *Deleted

## 2012-09-27 ENCOUNTER — Other Ambulatory Visit: Payer: Self-pay

## 2012-09-27 DIAGNOSIS — D381 Neoplasm of uncertain behavior of trachea, bronchus and lung: Secondary | ICD-10-CM

## 2012-09-27 MED ORDER — AZITHROMYCIN 250 MG PO TABS: ORAL_TABLET | ORAL | Status: DC

## 2012-09-27 MED ORDER — TRAMADOL HCL 50 MG PO TABS: 50.0000 mg | ORAL_TABLET | Freq: Four times a day (QID) | ORAL | Status: DC | PRN

## 2012-09-27 NOTE — Telephone Encounter (Signed)
Pt notified of rx requests sent to pharmacy

## 2012-10-24 ENCOUNTER — Ambulatory Visit (INDEPENDENT_AMBULATORY_CARE_PROVIDER_SITE_OTHER): Payer: PRIVATE HEALTH INSURANCE | Admitting: Gastroenterology

## 2012-10-24 ENCOUNTER — Encounter: Payer: Self-pay | Admitting: Gastroenterology

## 2012-10-24 VITALS — BP 112/74 | HR 58 | Temp 97.9°F | Ht 70.5 in | Wt 239.6 lb

## 2012-10-24 DIAGNOSIS — R112 Nausea with vomiting, unspecified: Secondary | ICD-10-CM

## 2012-10-24 DIAGNOSIS — R1013 Epigastric pain: Secondary | ICD-10-CM

## 2012-10-24 MED ORDER — ONDANSETRON HCL 4 MG PO TABS: 4.0000 mg | ORAL_TABLET | Freq: Three times a day (TID) | ORAL | Status: DC | PRN

## 2012-10-24 NOTE — Progress Notes (Signed)
Primary Care Physician: [name], MD  Primary Gastroenterologist:  Roetta Sessions, MD   Chief Complaint  Patient presents with  . Emesis    HPI: [name] is a 52 y.o. female here for further evaluation of N/V. She has h/o GERD, IBS-C. Last seen in 12/2011. Some nocturnal heartburn. Three weeks of N/V all during the day. Last two days no vomiting. Mid abd pain, worse with meals, no fever. Nothing seems to make pain better. C/O constipation. She has daily BM with intermittent diarrhea but more hard stools lately. BRBPR with BM last week. Denies dysphagia. Weight down 28 pounds since 12/2011 but was noted to have significant edema at that time.     Current Outpatient Prescriptions  Medication Sig Dispense Refill  . ALPRAZolam (XANAX) 0.25 MG tablet Take 0.25 mg by mouth 2 (two) times daily as needed. For anxiety      . atomoxetine (STRATTERA) 100 MG capsule Take 100 mg by mouth daily.        Marland Kitchen buPROPion (WELLBUTRIN SR) 150 MG 12 hr tablet Take 150 mg by mouth 2 (two) times daily.       . calcium-vitamin D (OSCAL WITH D) 500-200 MG-UNIT per tablet Take 1 tablet by mouth 3 (three) times daily.        . furosemide (LASIX) 40 MG tablet Take 80 mg by mouth daily.        . hydrOXYzine (ATARAX) 50 MG tablet Take 50 mg by mouth daily.        . hydrOXYzine (VISTARIL) 50 MG capsule 1 capsule 2 (two) times daily.      Marland Kitchen lamoTRIgine (LAMICTAL) 100 MG tablet Take 100 mg by mouth daily.        Marland Kitchen lisinopril (PRINIVIL,ZESTRIL) 5 MG tablet Take 5 mg by mouth daily.        . mirtazapine (REMERON) 30 MG tablet Take 30 mg by mouth at bedtime.        . nabumetone (RELAFEN) 500 MG tablet Take 500 mg by mouth 2 (two) times daily.        Marland Kitchen NEXIUM 40 MG capsule TAKE 1 CAPSULE BY MOUTH TWICE DAILY. 30 MINUTES BEFORE BREAKFAST & BEFORE DINNER.  60 capsule  5  . Olopatadine HCl (PATADAY) 0.2 % SOLN Place 4 drops into both eyes 3 (three) times daily.       . paliperidone (INVEGA) 9 MG 24 hr tablet Take 9 mg  by mouth every morning.        . potassium chloride (K-DUR) 10 MEQ tablet 1 tablet 2 (two) times daily.      . potassium chloride (KLOR-CON) 10 MEQ CR tablet Take 10 mEq by mouth daily.        . Probiotic Product (ALIGN) 4 MG CAPS Take 4 mg by mouth daily.      . sertraline (ZOLOFT) 100 MG tablet Take 200 mg by mouth daily.        Marland Kitchen torsemide (DEMADEX) 20 MG tablet Take 20 mg by mouth daily.      . traMADol (ULTRAM) 50 MG tablet Take 1 tablet (50 mg total) by mouth every 6 (six) hours as needed for pain.  20 tablet  0  . traZODone (DESYREL) 100 MG tablet Take 100 mg by mouth at bedtime.        No current facility-administered medications for this visit.    Allergies as of 10/24/2012 - Review Complete 10/24/2012  Allergen Reaction Noted  . Thorazine (chlorpromazine hcl) Anaphylaxis 10/20/2010  .  Acetaminophen Other (See Comments)   . Aspirin Other (See Comments)   . Aspirin-acetaminophen-caffeine Other (See Comments)   . Nsaids Nausea And Vomiting   . Penicillins Nausea And Vomiting   . Tomato Rash 01/17/2012    ROS:  General: Negative for anorexia, fever, chills, fatigue, weakness. ENT: Negative for hoarseness, difficulty swallowing , nasal congestion. CV: Negative for chest pain, angina, palpitations, dyspnea on exertion, peripheral edema.  Respiratory: Negative for dyspnea at rest, dyspnea on exertion, cough, sputum, wheezing.  GI: See history of present illness. GU:  Negative for dysuria, hematuria, urinary incontinence, urinary frequency, nocturnal urination.  Endo: Weight loss as outlined in hpi.    Physical Examination:   BP 112/74  Pulse 58  Temp(Src) 97.9 F (36.6 C) (Oral)  Ht 5' 10.5" (1.791 m)  Wt 239 lb 9.6 oz (108.682 kg)  BMI 33.88 kg/m2  General: Well-nourished, well-developed in no acute distress.  Eyes: No icterus. Mouth: Oropharyngeal mucosa moist and pink , no lesions erythema or exudate. Lungs: Clear to auscultation bilaterally.  Heart: Regular rate  and rhythm, no murmurs rubs or gallops.  Abdomen: Bowel sounds are normal, mild epig tenderness, nondistended, no hepatosplenomegaly or masses, no abdominal bruits or hernia , no rebound or guarding.   Extremities: Trace lower extremity edema. No clubbing or deformities. Neuro: Alert and oriented x 4   Skin: Warm and dry, no jaundice.   Psych: Alert and cooperative, normal mood and affect.    Imaging Studies: No results found.

## 2012-10-24 NOTE — Patient Instructions (Addendum)
Please have your blood work and abdominal ultrasound done. Prescription for nausea medication sent to [facility].

## 2012-10-25 ENCOUNTER — Encounter: Payer: Self-pay | Admitting: Gastroenterology

## 2012-10-25 NOTE — Assessment & Plan Note (Signed)
Several week h/o pp abdominal pain and N/V. Evaluate gallbladder via u/s. If negative, would offer her EGD again, she never had done last year due to fear. Zofran for nausea. Check labs.

## 2012-10-28 ENCOUNTER — Encounter: Payer: Self-pay | Admitting: Gastroenterology

## 2012-10-28 NOTE — Progress Notes (Signed)
Cc PCP 

## 2012-10-29 ENCOUNTER — Ambulatory Visit (HOSPITAL_COMMUNITY): Admission: RE | Admit: 2012-10-29 | Payer: PRIVATE HEALTH INSURANCE | Source: Ambulatory Visit

## 2012-10-31 ENCOUNTER — Other Ambulatory Visit: Payer: Self-pay | Admitting: Gastroenterology

## 2012-10-31 DIAGNOSIS — R112 Nausea with vomiting, unspecified: Secondary | ICD-10-CM

## 2012-11-13 ENCOUNTER — Ambulatory Visit (HOSPITAL_COMMUNITY)
Admission: RE | Admit: 2012-11-13 | Discharge: 2012-11-13 | Disposition: A | Discharge status: Home / Self Care | Payer: PRIVATE HEALTH INSURANCE | Source: Ambulatory Visit | Attending: Gastroenterology | Admitting: Gastroenterology

## 2012-11-13 DIAGNOSIS — R112 Nausea with vomiting, unspecified: Secondary | ICD-10-CM

## 2012-11-13 DIAGNOSIS — R110 Nausea: Secondary | ICD-10-CM | POA: Insufficient documentation

## 2012-11-13 DIAGNOSIS — K7689 Other specified diseases of liver: Secondary | ICD-10-CM | POA: Insufficient documentation

## 2012-11-13 LAB — COMPREHENSIVE METABOLIC PANEL
ALT: 16 U/L (ref 0–35)
AST: 30 U/L (ref 0–37)
Creat: 0.96 mg/dL (ref 0.50–1.10)
Total Bilirubin: 0.3 mg/dL (ref 0.3–1.2)

## 2012-11-13 LAB — CBC WITH DIFFERENTIAL/PLATELET
Basophils Absolute: 0 10*3/uL (ref 0.0–0.1)
Eosinophils Relative: 1 % (ref 0–5)
HCT: 35.6 % — ABNORMAL LOW (ref 36.0–46.0)
Lymphocytes Relative: 46 % (ref 12–46)
MCHC: 34.8 g/dL (ref 30.0–36.0)
MCV: 80 fL (ref 78.0–100.0)
Monocytes Absolute: 0.4 10*3/uL (ref 0.1–1.0)
Monocytes Relative: 9 % (ref 3–12)
RDW: 14 % (ref 11.5–15.5)
WBC: 4.4 10*3/uL (ref 4.0–10.5)

## 2012-11-13 LAB — LIPASE: Lipase: 43 U/L (ref 0–75)

## 2012-11-14 ENCOUNTER — Other Ambulatory Visit: Payer: PRIVATE HEALTH INSURANCE

## 2012-11-14 ENCOUNTER — Ambulatory Visit: Payer: PRIVATE HEALTH INSURANCE | Admitting: Cardiothoracic Surgery

## 2012-11-21 ENCOUNTER — Other Ambulatory Visit: Payer: PRIVATE HEALTH INSURANCE

## 2012-11-21 ENCOUNTER — Ambulatory Visit (INDEPENDENT_AMBULATORY_CARE_PROVIDER_SITE_OTHER): Payer: PRIVATE HEALTH INSURANCE | Admitting: Cardiothoracic Surgery

## 2012-11-21 DIAGNOSIS — R911 Solitary pulmonary nodule: Secondary | ICD-10-CM

## 2012-11-21 NOTE — Progress Notes (Deleted)
[address]            [place] [address]          [phone]       [name] [facility] Medical Record #[id number] Date of Birth: 09-Feb-1961  Referring: Babs Sciara, MD Primary Care: [name], MD  Chief Complaint:    Lung Nodule   History of Present Illness:    Patient is a 52 year old female with long-term history of smoking who had the incidental finding of the 8mm nodule in the right middle lobe. She is referred to thoracic surgery for evaluation of this and to develop a treatment plan. She's had no previous history of lung cancer. She does have a history of limited mental capacity and comes to the office today with her social worker from [place]. She's had no history of tuberculosis or known tuberculosis exposure.  She does give a history of having a myocardial infarction in being hospitalized for 5 days at age 71 no other details about this revealed. She does note that at times especially with exertion she feels a pounding heart and chest discomfort with exertional shortness of breath. She denies resting shortness of breath.  She does give a history of having a right-sided stroke 5 years ago symptoms of this have resolved.   Since last seen has cut back to smoking 4 cig a day Was unable to do full pfts   Current Activity/ Functional Status:     Past Medical History  Diagnosis Date  . Mental retardation   . Hyperglycemia   . HTN (hypertension)   . Migraines   . Anxiety   . IBS (irritable bowel syndrome)   . Diabetes mellitus   . Lung nodules     right, followed by PCP, PET 11/2011    Past Surgical History  Procedure Laterality Date  . Abdominal hysterectomy    . Foot surgery    . External ear surgery      bilateral  . Glaucoma surgery    . Colonoscopy  11/2007    hyperplastic polyps, prior hx of adenomas   . Small bowel capsule  10/2007    normal  . Esophagogastroduodenoscopy   08/2007    moderate sized hiatal hernia  . Colonoscopy  05/2010    incomplete due to poor prep, hyperplastic rectal polyp  . Esophagogastroduodenoscopy  05/2010    noncritical schatzki ring s/p 29F  . Colonoscopy  05/05/2002    Dimunitive polyps in the rectum and left colon, cold    biopsied/removed.  Scattered few left-sided diverticula.  Regular colonic   mucosa appeared normal    Family History  Problem Relation Age of Onset  . Colon cancer      aunt  . Liver disease Neg Hx   . Inflammatory bowel disease Neg Hx   . Stroke Mother   . Heart attack Father   . Obesity    . COPD    . GER disease    . Diabetes type II    . Anxiety disorder    . Depression        History  Smoking status  . Current Every Day Smoker -- 0.25 packs/day for 40 years  . Types: Cigarettes  Smokeless tobacco  . Never Used  History  Alcohol Use No   Says does not drink alcohol      Allergies  Allergen Reactions  . Thorazine (Chlorpromazine Hcl) Anaphylaxis  . Acetaminophen Other (See Comments)    Makes pt dizzy  . Aspirin Other (See Comments)    seizure  . Aspirin-Acetaminophen-Caffeine Other (See Comments)    seizure  . Nsaids Nausea And Vomiting  . Penicillins Nausea And Vomiting  . Tomato Rash    Current Outpatient Prescriptions  Medication Sig Dispense Refill  . ALPRAZolam (XANAX) 0.25 MG tablet Take 0.25 mg by mouth 2 (two) times daily as needed. For anxiety      . atomoxetine (STRATTERA) 100 MG capsule Take 100 mg by mouth daily.        Marland Kitchen buPROPion (WELLBUTRIN SR) 150 MG 12 hr tablet Take 150 mg by mouth 2 (two) times daily.       . calcium-vitamin D (OSCAL WITH D) 500-200 MG-UNIT per tablet Take 1 tablet by mouth 3 (three) times daily.        Marland Kitchen esomeprazole (NEXIUM) 40 MG capsule Take 1 capsule (40 mg total) by mouth daily before breakfast.  60 capsule  3  . furosemide (LASIX) 40 MG tablet Take 80 mg by mouth daily.        . hydrOXYzine (ATARAX) 50 MG tablet Take 50 mg by  mouth daily.        . hydrOXYzine (VISTARIL) 50 MG capsule 1 capsule 2 (two) times daily.      Marland Kitchen lamoTRIgine (LAMICTAL) 100 MG tablet Take 100 mg by mouth daily.        Marland Kitchen lisinopril (PRINIVIL,ZESTRIL) 5 MG tablet Take 5 mg by mouth daily.        . mirtazapine (REMERON) 30 MG tablet Take 30 mg by mouth at bedtime.        . nabumetone (RELAFEN) 500 MG tablet Take 500 mg by mouth 2 (two) times daily.        . Olopatadine HCl (PATADAY) 0.2 % SOLN Place 4 drops into both eyes 3 (three) times daily.       . ondansetron (ZOFRAN) 4 MG tablet Take 1 tablet (4 mg total) by mouth every 8 (eight) hours as needed for nausea.  20 tablet  0  . paliperidone (INVEGA) 9 MG 24 hr tablet Take 9 mg by mouth every morning.        . potassium chloride (K-DUR) 10 MEQ tablet 1 tablet 2 (two) times daily.      . potassium chloride (KLOR-CON) 10 MEQ CR tablet Take 10 mEq by mouth daily.        . Probiotic Product (ALIGN) 4 MG CAPS Take 4 mg by mouth daily.      . sertraline (ZOLOFT) 100 MG tablet Take 200 mg by mouth daily.        Marland Kitchen torsemide (DEMADEX) 20 MG tablet Take 20 mg by mouth daily.      . traMADol (ULTRAM) 50 MG tablet Take 1 tablet (50 mg total) by mouth every 6 (six) hours as needed for pain.  20 tablet  0  . traZODone (DESYREL) 100 MG tablet Take 100 mg by mouth at bedtime.        No current facility-administered medications for this visit.       Review of Systems:     Cardiac Review of Systems: Y or N  Chest Pain [ y ]  Resting SOB [ n  ] Exertional SOB  Cove.Etienne ]  Orthopnea [  n ]   Pedal Edema Cove.Etienne   ]    Palpitations Cove.Etienne  ] Syncope  Cove.Etienne  ]   Presyncope [ n  ]  General Review of Systems: [Y] = yes [  ]=no Constitional: recent weight change [ y ]; anorexia [  ]; fatigue [  ]; nausea [  ]; night sweats [  ]; fever [  ]; or chills [  ];                                                                                                                                          Dental: poor dentition[y  ];  Eye :  blurred vision [  ]; diplopia [   ]; vision changes [  ];  Amaurosis fugax[  ]; Resp: cough [  ];  wheezing[  ];  hemoptysis[  ]; shortness of breath[  ]; paroxysmal nocturnal dyspnea[  ]; dyspnea on exertion[  ]; or orthopnea[  ];  GI:  gallstones[  ], vomiting[  ];  dysphagia[  ]; melena[  ];  hematochezia [  ]; heartburn[  ];   Hx of  Colonoscopy[  ]; GU: kidney stones [  ]; hematuria[  ];   dysuria [  ];  nocturia[  ];  history of     obstruction [  ];             Skin: rash, swelling[  ];, hair loss[  ];  peripheral edema[  ];  or itching[  ]; Musculosketetal: myalgias[  ];  joint swelling[  ];  joint erythema[  ];  joint pain[  ];  back pain[  ];  Heme/Lymph: bruising[  ];  bleeding[  ];  anemia[  ];  Neuro: TIA[  ];  headaches[  ];  stroke[y  ];  vertigo[y  ];  seizures[y  ];   paresthesias[ y ];  difficulty walking[ y ];  Psych:depression[ y ]; anxiety[ y ];  Endocrine: diabetes[  ];  thyroid dysfunction[n  ];  Immunizations: Flu [ ? ]; Pneumococcal[?  ];  Other: Patient has a very positive review of systems including weight gain loss of appetite chest pain chest tightness shortness of breath and lying flat palpitation shortness of breath with exertion heart murmur atrial fibrillation arrhythmia productive cough bronchitis asthma wheezing abdominal pain constipation reflux trouble swallowing pain in her legs with walking pain feet when lying flat temporary blindness in one eye dizziness blackouts headaches muscle pain joint pain arthritis depression nervousness change in eyesight and change in hearing.  Physical Exam: .There were no vitals taken for this visit.   General appearance: alert, cooperative, appears older than stated age and no distress Neurologic: intact Heart: regular rate and rhythm, S1, S2 normal, no murmur, click, rub or gallop and normal apical impulse Lungs: clear to auscultation bilaterally and normal percussion bilaterally Abdomen: soft, non-tender; bowel sounds  normal;  no masses,  no organomegaly Extremities: edema mild bil pedal edema and Homans sign is negative, no sign of DVT Wound:  Patient has no carotid bruits no cervical supraclavicular adenopathy is appreciated, especially in the area noted on that scan in the right axilla   Diagnostic Studies & Laboratory data:     Recent Radiology Findings:   US Abdomen Complete  11/13/2012   *RADIOLOGY REPORT*  Clinical Data:  Nausea.  COMPLETE ABDOMINAL ULTRASOUND  Comparison:  CT scan of the abdomen dated 10/10/2011.  Findings:  Gallbladder:  No gallstones, gallbladder wall thickening, or pericholecystic fluid. Negative sonographic Murphy's sign.  Common bile duct:  Normal.  3.7 mm in diameter.  Liver:  Diffuse increase echogenicity consistent with hepatic steatosis with a small focal area of fatty sparing adjacent to the gallbladder.  IVC:  Normal.  Pancreas:  The head, body, and majority of the tail of the pancreas are visualized and are normal.  Extreme tail of the pancreas is obscured by bowel.  Spleen:  Normal.  6.0 cm in length.  Right Kidney:  Normal.  12.3 cm in length.  Left Kidney:  Normal.  11.6 cm in length.  Abdominal aorta:  Normal.  2.0 cm in diameter.  IMPRESSION: Hepatic steatosis.  Otherwise, normal exam.  Nonvisualization of the distal tail of the pancreas.   Original Report Authenticated By: Francene Boyers, M.D.   Ct Chest W Contrast  04/22/2012  *RADIOLOGY REPORT*  Clinical Data: Follow-up pulmonary nodule.  CT CHEST WITH CONTRAST  Technique:  Multidetector CT imaging of the chest was performed following the standard protocol during bolus administration of intravenous contrast.  Contrast: 80mL OMNIPAQUE IOHEXOL 300 MG/ML  SOLN  Comparison: PET 11/29/2011 and CT chest 11/01/2011.  Findings: No pathologically enlarged mediastinal, hilar or axillary lymph nodes.  Heart size normal.  No pericardial effusion.  A nodule in the right middle lobe measures 10 x 7 mm, stable from 11/01/2011 when  remeasured.  A tiny subpleural nodular density in the right lower lobe measures 3 mm (image 42), stable.  No pleural fluid.  Airway is unremarkable.  Incidental imaging of the upper abdomen shows low attenuation throughout the visualized portion of the liver.  No worrisome lytic or sclerotic lesions.  IMPRESSION: Right middle lobe nodule is unchanged from 11/01/2011.  Given mild hypermetabolism on 11/29/2011, a follow-up interval of 6 months is recommended to confirm continued stability.   Original Report Authenticated By: Reyes Ivan, M.D.      11/09/2011 RADIOLOGY REPORT*  Clinical Data: Right basilar pulmonary nodule on abdominal CT.  Dysphagia. The patient has diabetes mellitus and unspecified  mental retardation.  CT CHEST WITH CONTRAST  Technique: Multidetector CT imaging of the chest was performed  following the standard protocol during bolus administration of  intravenous contrast.  Contrast: 80mL OMNIPAQUE IOHEXOL 300 MG/ML SOLN  Comparison: Abdominal CTs 10/10/2011 and 01/19/2005. Chest  radiographs 10/23/2001.  Findings: As demonstrated on the recent abdominal CT, there is a  lobulated well circumscribed 10 x 9 mm right middle lobe nodule on  image 35. This is noncalcified. This area of the lung bases was  not imaged on the 2006 CT. No other pulmonary nodules are  identified. There is no airspace disease or confluent airspace  opacity.  There are no enlarged mediastinal or hilar lymph nodes. There is  no pleural or pericardial effusion. Minimal aortic atherosclerosis  is noted.  Images through the upper abdomen demonstrate diffuse hepatic  steatosis. There is no adrenal mass.  IMPRESSION:  1. Lobulated noncalcified 1-cm right middle lobe nodule was not  imaged on prior abdominal CT. This nodule is not conclusively  demonstrated on radiographs such that stability cannot be  addressed.  2. This nodule is nonspecific and could reflect a hamartoma or  other benign lesion.  However, malignancy cannot be excluded.  3. No other nodules, adenopathy or acute findings.  This nodule would be difficult to biopsy given its close proximity  to the heart. Management options include PET CT to assess for  hypermetabolic activity and follow-up CT (depending on risk factors  for malignancy). If follow-up is elected, follow-up chest CT at 3  months is recommended. This recommendation is adapted from the  consensus statement: Guidelines for Management of Small Pulmonary  Nodules Detected on CT Scans: A Statement from the Fleischner  Society as published in Radiology 2005; 237:395-400.  Original Report Authenticated By: Gerrianne Scale, M.D.  Kary Kos, MD Tue Jan 09, 2012 2:52:39 PM EDT       **ADDENDUM** CREATED: 01/09/2012 14:43:25  Voice recognition errors:  Within the Findings section, the second sentence of the CHEST  section should read: "NO additional hypermetabolic nodules are  present."  Within the first IMPRESSION, the last phrase of the first sentence  should read: "for a nodule this SIZE."  Findings discussed with Dr. Tyrone Sage on 07/ 08/2011  **END ADDENDUM** SIGNED BY: Genevive Bi, M.D.      Study Result     *RADIOLOGY REPORT*  Clinical Data: Recent imaging demonstrates a solitary pulmonary  nodule. FDG PET CT requested to evaluate for possible malignancy.  NUCLEAR MEDICINE PET SKULL BASE TO THIGH  Fasting Blood Glucose: 113  Technique: 18.4 mCi F-18 FDG was injected intravenously via the  right antecubital fossa. CT data was obtained and used for  attenuation correction and anatomic localization only. (This was  not acquired as a diagnostic CT examination.) Additional exam  technical data entered on technologist worksheet.  Comparison: CT 11/01/2011  Findings:  Head/Neck: No hypermetabolic lymph nodes in the neck.  Chest: Within the right middle lobe there is a 8 mm pulmonary  nodule adjacent to the right atrium with mild  metabolic activity (  SUV max = 2.5). Additional hypermetabolic nodules are present. No  hypermetabolic mediastinal lymph nodes.  There is a single hypermetabolic right axillary lymph node which is  normal pathology. There is misregistration in the right axilla due  to patient motion.  Abdomen/Pelvis: No abnormal hypermetabolic activity within the  liver, pancreas, adrenal glands, or spleen. No hypermetabolic  lymph nodes in the abdomen or pelvis.  Skeleton: No focal hypermetabolic activity to suggest skeletal  metastasis. Sclerosis of the left SI joint appears benign.  IMPRESSION:  1. Mildly hypermetabolic nodule within the right middle lobe.  Although the activity is mild, for a nodule this side this is  concerning although nonspecific. If biopsy is not feasible due to  location, recommend short-term follow-up CT with contrast in 1 to  3 months.  2. No evidence of metastasis.  3. Hypermetabolic right axial lymph node is likely reactive.  Original Report Authenticated By: Genevive Bi, M.D.       Recent Lab Findings: Lab Results  Component Value Date   WBC 4.4 11/13/2012   HGB 12.4 11/13/2012   HCT 35.6* 11/13/2012   PLT 217 11/13/2012   GLUCOSE 87 11/13/2012   CHOL  Value: 147        ATP III CLASSIFICATION:  <200  mg/dL   Desirable  161-096  mg/dL   Borderline High  >=045    mg/dL   High 40/98/1191   TRIG 90 05/23/2008   HDL 51 05/23/2008   LDLCALC  Value: 78        Total Cholesterol/HDL:CHD Risk Coronary Heart Disease Risk Table                     Men   Women  1/2 Average Risk   3.4   3.3 05/23/2008   ALT 16 11/13/2012   AST 30 11/13/2012   NA 138 11/13/2012   K 4.0 11/13/2012   CL 96 11/13/2012   CREATININE 0.96 11/13/2012   BUN 7 11/13/2012   CO2 34* 11/13/2012      Assessment / Plan:   9 mm right middle lobe lung nodule in a smoker abutting the right margin of the heart The size and location of this nodule makes it very difficult to biopsy percutaneously or with ENB . I  discussed with her the treatment options and have recommended that we wait 5 months and repeat CT scan.  Patient . prefers to wait before proceeding with surgical resection., since have seen no change since 10/2011  I spent at least 15 minutes with her and her social worker discussing methods for her to stop smoking and the need to and options for treatment.  She did not complete pfts will try again   . Mental retardation   . Hyperglycemia   . HTN (hypertension)   . Migraines   . Anxiety   . IBS (irritable bowel syndrome)   . Diabetes mellitus   .               Delight Ovens MD  Beeper [phone] Office [phone] 11/21/2012 10:26 AM

## 2012-11-25 NOTE — Progress Notes (Signed)
Patient never came to office.

## 2012-11-26 NOTE — Progress Notes (Signed)
Quick Note:  Pt aware ______ 

## 2012-11-26 NOTE — Progress Notes (Signed)
Quick Note:  Gallbladder without stones. She has fatty liver. Normal LFTs. See lab result note. ______

## 2012-11-26 NOTE — Progress Notes (Signed)
Quick Note:  Labs are ok. Fatty liver on u/s. It has been over one month since we saw her. She likely needs EGD but let's get her back in to office and see. ______

## 2012-11-29 ENCOUNTER — Other Ambulatory Visit (HOSPITAL_COMMUNITY): Payer: Self-pay | Admitting: Family Medicine

## 2012-12-03 NOTE — Telephone Encounter (Signed)
Seen in office 09/23/12 for diabetic check up

## 2012-12-10 ENCOUNTER — Ambulatory Visit (INDEPENDENT_AMBULATORY_CARE_PROVIDER_SITE_OTHER): Payer: Medicaid Other | Admitting: Gastroenterology

## 2012-12-10 ENCOUNTER — Encounter: Payer: Self-pay | Admitting: Gastroenterology

## 2012-12-10 VITALS — BP 122/78 | HR 83 | Temp 98.4°F | Ht 70.0 in | Wt 239.6 lb

## 2012-12-10 DIAGNOSIS — R195 Other fecal abnormalities: Secondary | ICD-10-CM | POA: Insufficient documentation

## 2012-12-10 DIAGNOSIS — R1310 Dysphagia, unspecified: Secondary | ICD-10-CM | POA: Insufficient documentation

## 2012-12-10 NOTE — Progress Notes (Signed)
Referring Provider: Babs Sciara, MD Primary Care Physician:  [name], MD Primary GI: Dr. Jena Gauss   Chief Complaint  Patient presents with  . Diarrhea  . Colonoscopy    HPI:   [name] is a 52 year old female who returns today to discuss possibly pursuing an upper endoscopy. Last EGD in 2011 by Dr. Jena Gauss with non-critical Schatzki's ring s/p 51 F dilation. She was seen by our office April 2014 with N/V, postprandial abdominal pain. [place] of abdomen with fatty liver, labs to include CBC, HFP, lipase unimpressive.    Weight 260 in July 2013, now 239.   BM about 4-5 times per day, states loose stools. Cramping after BM. No abx that she is aware. Present since April, since she "had the tests done". States low-volume hematochezia sometimes. Has hx of IBS, in past trended towards constipation. Occasional N/V, but much improved. Notes epigastric pain after eating. Vomiting sometimes 3-4 times per day. Denies NSAIDs. No melena. Notes her baseline for BMs was twice a day. Occasional pill dysphagia, intermittent solid food dysphagia.   Past Medical History  Diagnosis Date  . Mental retardation   . Hyperglycemia   . HTN (hypertension)   . Migraines   . Anxiety   . IBS (irritable bowel syndrome)   . Diabetes mellitus   . Lung nodules     right, followed by PCP, PET 11/2011    Past Surgical History  Procedure Laterality Date  . Abdominal hysterectomy    . Foot surgery    . External ear surgery      bilateral  . Glaucoma surgery    . Colonoscopy  11/2007    hyperplastic polyps, prior hx of adenomas   . Small bowel capsule  10/2007    normal  . Esophagogastroduodenoscopy  08/2007    moderate sized hiatal hernia  . Colonoscopy  05/2010    incomplete due to poor prep, hyperplastic rectal polyp  . Esophagogastroduodenoscopy  05/2010    noncritical schatzki ring s/p 47F  . Colonoscopy  05/05/2002    Dimunitive polyps in the rectum and left colon, cold    biopsied/removed.  Scattered few  left-sided diverticula.  Regular colonic   mucosa appeared normal    Current Outpatient Prescriptions  Medication Sig Dispense Refill  . ALPRAZolam (XANAX) 0.25 MG tablet Take 0.25 mg by mouth 2 (two) times daily as needed. For anxiety      . atomoxetine (STRATTERA) 100 MG capsule Take 100 mg by mouth daily.        Marland Kitchen buPROPion (WELLBUTRIN SR) 150 MG 12 hr tablet Take 150 mg by mouth 2 (two) times daily.       . calcium-vitamin D (OSCAL WITH D) 500-200 MG-UNIT per tablet Take 1 tablet by mouth 3 (three) times daily.        Marland Kitchen esomeprazole (NEXIUM) 40 MG capsule Take 1 capsule (40 mg total) by mouth daily before breakfast.  60 capsule  3  . furosemide (LASIX) 40 MG tablet Take 80 mg by mouth daily.        . hydrOXYzine (ATARAX) 50 MG tablet Take 50 mg by mouth daily.        . hydrOXYzine (VISTARIL) 50 MG capsule 1 capsule 2 (two) times daily.      Marland Kitchen lamoTRIgine (LAMICTAL) 100 MG tablet Take 100 mg by mouth daily.        Marland Kitchen lisinopril (PRINIVIL,ZESTRIL) 5 MG tablet Take 5 mg by mouth daily.        Marland Kitchen  mirtazapine (REMERON) 30 MG tablet Take 30 mg by mouth at bedtime.        . nabumetone (RELAFEN) 500 MG tablet TAKE (1) TABLET BY MOUTH TWICE DAILY.  60 tablet  2  . Olopatadine HCl (PATADAY) 0.2 % SOLN Place 4 drops into both eyes 3 (three) times daily.       . ondansetron (ZOFRAN) 4 MG tablet Take 1 tablet (4 mg total) by mouth every 8 (eight) hours as needed for nausea.  20 tablet  0  . paliperidone (INVEGA) 9 MG 24 hr tablet Take 9 mg by mouth every morning.        . potassium chloride (KLOR-CON) 10 MEQ CR tablet Take 10 mEq by mouth daily.        . Probiotic Product (ALIGN) 4 MG CAPS Take 4 mg by mouth daily.      . sertraline (ZOLOFT) 100 MG tablet Take 200 mg by mouth daily.        Marland Kitchen torsemide (DEMADEX) 20 MG tablet Take 20 mg by mouth daily.      . traMADol (ULTRAM) 50 MG tablet Take 1 tablet (50 mg total) by mouth every 6 (six) hours as needed for pain.  20 tablet  0  . traZODone (DESYREL)  100 MG tablet Take 100 mg by mouth at bedtime.       . potassium chloride (K-DUR) 10 MEQ tablet 1 tablet 2 (two) times daily.       No current facility-administered medications for this visit.    Allergies as of 12/10/2012 - Review Complete 12/10/2012  Allergen Reaction Noted  . Thorazine (chlorpromazine hcl) Anaphylaxis 10/20/2010  . Acetaminophen Other (See Comments)   . Aspirin Other (See Comments)   . Aspirin-acetaminophen-caffeine Other (See Comments)   . Nsaids Nausea And Vomiting   . Penicillins Nausea And Vomiting   . Tomato Rash 01/17/2012    Family History  Problem Relation Age of Onset  . Colon cancer      aunt  . Liver disease Neg Hx   . Inflammatory bowel disease Neg Hx   . Stroke Mother   . Heart attack Father   . Obesity    . COPD    . GER disease    . Diabetes type II    . Anxiety disorder    . Depression      History   Social History  . Marital Status: Divorced    Spouse Name: N/A    Number of Children: N/A  . Years of Education: N/A   Occupational History  . disabled    Social History Main Topics  . Smoking status: Current Every Day Smoker -- 0.25 packs/day for 40 years    Types: Cigarettes  . Smokeless tobacco: Never Used  . Alcohol Use: No     Comment: social drinker  . Drug Use: No  . Sexually Active: Yes -- Female partner(s)    Birth Control/ Protection: None     Comment: boyfriend   Other Topics Concern  . None   Social History Narrative  . None    Review of Systems: Negative unless mentioned in HPI  Physical Exam: BP 122/78  Pulse 83  Temp(Src) 98.4 F (36.9 C) (Oral)  Ht 5\' 10"  (1.778 m)  Wt 239 lb 9.6 oz (108.682 kg)  BMI 34.38 kg/m2 General:   Alert and oriented. No distress noted. Pleasant and cooperative.  Head:  Normocephalic and atraumatic. Eyes:  Conjuctiva clear without scleral icterus. Mouth:  Oral  mucosa pink and moist. Good dentition. No lesions. Heart:  S1, S2 present without murmurs, rubs, or gallops.  Regular rate and rhythm. Abdomen:  +BS, soft, non-tender and non-distended. No rebound or guarding. No HSM or masses noted. Msk:  Symmetrical without gross deformities. Normal posture. Extremities:  Without edema. Neurologic:  Alert and  oriented x4;  grossly normal neurologically. Skin:  Intact without significant lesions or rashes. Psych:  Alert and cooperative. Normal mood and affect.

## 2012-12-10 NOTE — Patient Instructions (Addendum)
Please complete stool studies as soon as possible.   We have set you up for an upper endoscopy with possible dilation with Dr. Jena Gauss in the near future.   Please complete blood work as well.

## 2012-12-11 NOTE — Assessment & Plan Note (Signed)
Question IBS, but historically patient dealt with more constipation. Occasional low-volume hematochezia noted. Last TCS in 2011, with history of adenomatous polyps in remote past. Evaluation in 2011 with poor prep, and she may ultimately need an updated surveillance colonoscopy in near future. Will pursue stool studies first, check TSH. If persistent loose stools with no etiology, pursue updated lower GI evaluation. Otherwise, consider surveillance in October this year, which will have been 3 years. Small polyps could have been obscured by poor prep at time of last colonoscopy, so a shorter interval follow-up should be considered.

## 2012-12-11 NOTE — Assessment & Plan Note (Addendum)
52 year old female with history of non-critical Schatzki's ring s/p 67 F dilation in 2011 by Dr. Jena Gauss, presenting with recurrent solid food and pill dysphagia. Also notable intermittent epigastric pain after eating, N/V. No NSAIDs or melena. N/V has actually improved from visit in April. Due to continued upper GI symptoms and dysphagia, proceed with EGD/ED in near future. With epigastric pain, concern for underlying biliary dyskinesia remains. [place] of abdomen updated and without evidence of cholelithiasis. Labs overall unrevealing. May need HIDA if EGD negative.   Proceed with upper endoscopy/dilation in the near future with Dr. Jena Gauss. The risks, benefits, and alternatives have been discussed in detail with patient. They have stated understanding and desire to proceed.

## 2012-12-12 NOTE — Progress Notes (Signed)
Cc PCP 

## 2012-12-16 ENCOUNTER — Ambulatory Visit (HOSPITAL_COMMUNITY)
Admission: RE | Admit: 2012-12-16 | Payer: PRIVATE HEALTH INSURANCE | Source: Ambulatory Visit | Admitting: Internal Medicine

## 2012-12-16 ENCOUNTER — Encounter (HOSPITAL_COMMUNITY): Admission: RE | Payer: Self-pay | Source: Ambulatory Visit

## 2012-12-16 SURGERY — ESOPHAGOGASTRODUODENOSCOPY (EGD) WITH ESOPHAGEAL DILATION
Anesthesia: Moderate Sedation

## 2013-01-02 ENCOUNTER — Encounter: Payer: Self-pay | Admitting: Cardiothoracic Surgery

## 2013-01-02 ENCOUNTER — Ambulatory Visit
Admission: RE | Admit: 2013-01-02 | Discharge: 2013-01-02 | Disposition: A | Discharge status: Home / Self Care | Payer: PRIVATE HEALTH INSURANCE | Source: Ambulatory Visit | Attending: Cardiothoracic Surgery | Admitting: Cardiothoracic Surgery

## 2013-01-02 ENCOUNTER — Ambulatory Visit (INDEPENDENT_AMBULATORY_CARE_PROVIDER_SITE_OTHER): Payer: PRIVATE HEALTH INSURANCE | Admitting: Cardiothoracic Surgery

## 2013-01-02 ENCOUNTER — Other Ambulatory Visit: Payer: Self-pay

## 2013-01-02 VITALS — BP 112/77 | HR 79 | Resp 16 | Ht 70.5 in | Wt 240.0 lb

## 2013-01-02 DIAGNOSIS — R911 Solitary pulmonary nodule: Secondary | ICD-10-CM

## 2013-01-02 DIAGNOSIS — D381 Neoplasm of uncertain behavior of trachea, bronchus and lung: Secondary | ICD-10-CM

## 2013-01-02 DIAGNOSIS — Z7189 Other specified counseling: Secondary | ICD-10-CM

## 2013-01-02 DIAGNOSIS — Z712 Person consulting for explanation of examination or test findings: Secondary | ICD-10-CM

## 2013-01-02 DIAGNOSIS — J984 Other disorders of lung: Secondary | ICD-10-CM

## 2013-01-02 NOTE — Patient Instructions (Signed)
Spot on rt lung unchanged since oct 2013 Continue efforts to stop smoking Return one year for follow up CT of the chest

## 2013-01-02 NOTE — Progress Notes (Signed)
[address]       [place] [address]             [phone]       [name] Medical Record #[id number] Date of Birth: 1961/01/17  Referring: Babs Sciara, MD Primary Care: [name], MD  Chief Complaint:    Lung Nodule   History of Present Illness:    Patient is a 52 year old female with long-term history of smoking who had the incidental finding of the 8mm nodule in the right middle lobe. She is referred to thoracic surgery for evaluation of this and to develop a treatment plan in July 2013 . She's had no previous history of lung cancer. She does have a history of limited mental capacity and comes to the office today with her social worker from [place]. She's had no history of tuberculosis or known tuberculosis exposure.  She does give a history of having a myocardial infarction in being hospitalized for 5 days at age 43 no other details about this revealed. She does note that at times especially with exertion she feels a pounding heart and chest discomfort with exertional shortness of breath. She denies resting shortness of breath.  She does give a history of having a right-sided stroke 5 years ago symptoms of this have resolved.   The patient was unable to complete pulmonary function studies, and in fact when doing the pulmonary function studies ended up in the emergency room with shortness of breath and chest pain.  She continues to smoke, but has decreased to one pack lasting for 3 days.   Current Activity/ Functional Status: Patient Patient Patient is independent with mobility/ambulation, transfers, ADL's, IADL's. The patient does level and but has assistance of a Child psychotherapist because of her limited mental capacity.      Past Medical History  Diagnosis Date  . Mental retardation   . Hyperglycemia   . HTN (hypertension)   . Migraines   . Anxiety   . IBS (irritable bowel syndrome)   . Diabetes mellitus   .  Lung nodules     right, followed by PCP, PET 11/2011    Past Surgical History  Procedure Laterality Date  . Abdominal hysterectomy    . Foot surgery    . External ear surgery      bilateral  . Glaucoma surgery    . Colonoscopy  11/2007    hyperplastic polyps, prior hx of adenomas   . Small bowel capsule  10/2007    normal  . Esophagogastroduodenoscopy  08/2007    moderate sized hiatal hernia  . Colonoscopy  05/2010    incomplete due to poor prep, hyperplastic rectal polyp  . Esophagogastroduodenoscopy  05/2010    noncritical schatzki ring s/p 76F  . Colonoscopy  05/05/2002    Dimunitive polyps in the rectum and left colon, cold    biopsied/removed.  Scattered few left-sided diverticula.  Regular colonic   mucosa appeared normal    Family History  Problem Relation Age of Onset  . Colon cancer      aunt  . Liver disease Neg Hx   . Inflammatory bowel disease Neg Hx   . Stroke Mother   . Heart attack Father   . Obesity    . COPD    . GER disease    . Diabetes type II    . Anxiety disorder    . Depression  History  Smoking status  . Current Every Day Smoker -- 0.25 packs/day for 40 years  . Types: Cigarettes  Smokeless tobacco  . Never Used    History  Alcohol Use No   Says does not drink alcohol      Allergies  Allergen Reactions  . Thorazine (Chlorpromazine Hcl) Anaphylaxis  . Acetaminophen Other (See Comments)    Makes pt dizzy  . Aspirin Other (See Comments)    seizure  . Aspirin-Acetaminophen-Caffeine Other (See Comments)    seizure  . Nsaids Nausea And Vomiting  . Penicillins Nausea And Vomiting  . Tomato Rash    Current Outpatient Prescriptions  Medication Sig Dispense Refill  . ALPRAZolam (XANAX) 0.25 MG tablet Take 0.25 mg by mouth 2 (two) times daily as needed. For anxiety      . atomoxetine (STRATTERA) 100 MG capsule Take 100 mg by mouth daily.        Marland Kitchen buPROPion (WELLBUTRIN SR) 150 MG 12 hr tablet Take 150 mg by mouth 2 (two) times  daily.       . calcium-vitamin D (OSCAL WITH D) 500-200 MG-UNIT per tablet Take 1 tablet by mouth 3 (three) times daily.        Marland Kitchen esomeprazole (NEXIUM) 40 MG capsule Take 1 capsule (40 mg total) by mouth daily before breakfast.  60 capsule  3  . furosemide (LASIX) 40 MG tablet Take 80 mg by mouth daily.        . hydrOXYzine (ATARAX) 50 MG tablet Take 50 mg by mouth daily.        . hydrOXYzine (VISTARIL) 50 MG capsule 1 capsule 2 (two) times daily.      Marland Kitchen lamoTRIgine (LAMICTAL) 100 MG tablet Take 100 mg by mouth daily.        Marland Kitchen lisinopril (PRINIVIL,ZESTRIL) 5 MG tablet Take 5 mg by mouth daily.        . mirtazapine (REMERON) 30 MG tablet Take 30 mg by mouth at bedtime.        . nabumetone (RELAFEN) 500 MG tablet TAKE (1) TABLET BY MOUTH TWICE DAILY.  60 tablet  2  . Olopatadine HCl (PATADAY) 0.2 % SOLN Place 4 drops into both eyes 3 (three) times daily.       . ondansetron (ZOFRAN) 4 MG tablet Take 1 tablet (4 mg total) by mouth every 8 (eight) hours as needed for nausea.  20 tablet  0  . paliperidone (INVEGA) 9 MG 24 hr tablet Take 9 mg by mouth every morning.        . potassium chloride (K-DUR) 10 MEQ tablet 1 tablet 2 (two) times daily.      . potassium chloride (KLOR-CON) 10 MEQ CR tablet Take 10 mEq by mouth daily.        . Probiotic Product (ALIGN) 4 MG CAPS Take 4 mg by mouth daily.      . sertraline (ZOLOFT) 100 MG tablet Take 200 mg by mouth daily.        Marland Kitchen torsemide (DEMADEX) 20 MG tablet Take 20 mg by mouth daily.      . traMADol (ULTRAM) 50 MG tablet Take 1 tablet (50 mg total) by mouth every 6 (six) hours as needed for pain.  20 tablet  0  . traZODone (DESYREL) 100 MG tablet Take 100 mg by mouth at bedtime.        No current facility-administered medications for this visit.       Review of Systems:  Cardiac Review of Systems: Y or N  Chest Pain [ y ]  Resting SOB [ n  ] Exertional SOB  Cove.Etienne ]  Pollyann Kennedy [place].Brash ]   Pedal Edema Cove.Etienne   ]    Palpitations Cove.Etienne  ] Syncope  Cove.Etienne  ]     Presyncope [ n  ]  General Review of Systems: [Y] = yes [  ]=no Constitional: recent weight change [ y ]; anorexia [  ]; fatigue [  ]; nausea [  ]; night sweats [  ]; fever [  ]; or chills [  ];                                                                                                                                          Dental: poor dentition[y  ];  Eye : blurred vision [  ]; diplopia [   ]; vision changes [  ];  Amaurosis fugax[  ]; Resp: cough [  ];  wheezing[  ];  hemoptysis[  ]; shortness of breath[  ]; paroxysmal nocturnal dyspnea[  ]; dyspnea on exertion[  ]; or orthopnea[  ];  GI:  gallstones[  ], vomiting[  ];  dysphagia[  ]; melena[  ];  hematochezia [  ]; heartburn[  ];   Hx of  Colonoscopy[  ]; GU: kidney stones [  ]; hematuria[  ];   dysuria [  ];  nocturia[  ];  history of     obstruction [  ];             Skin: rash, swelling[  ];, hair loss[  ];  peripheral edema[  ];  or itching[  ]; Musculosketetal: myalgias[  ];  joint swelling[  ];  joint erythema[  ];  joint pain[  ];  back pain[  ];  Heme/Lymph: bruising[  ];  bleeding[  ];  anemia[  ];  Neuro: TIA[  ];  headaches[  ];  stroke[y  ];  vertigo[y  ];  seizures[y  ];   paresthesias[ y ];  difficulty walking[ y ];  Psych:depression[ y ]; anxiety[ y ];  Endocrine: diabetes[  ];  thyroid dysfunction[n  ];  Immunizations: Flu [ ? ]; Pneumococcal[?  ];  Other: Patient has a very positive review of systems including weight gain loss of appetite chest pain chest tightness shortness of breath and lying flat palpitation shortness of breath with exertion heart murmur atrial fibrillation arrhythmia productive cough bronchitis asthma wheezing abdominal pain constipation reflux trouble swallowing pain in her legs with walking pain feet when lying flat temporary blindness in one eye dizziness blackouts headaches muscle pain joint pain arthritis depression nervousness change in eyesight and change in hearing.  Physical Exam: .BP  112/77  Pulse 79  Resp 16  Ht 5' 10.5" (1.791 m)  Wt 240 lb (108.863 kg)  BMI 33.94 kg/m2  SpO2 98%  General appearance: alert, cooperative, appears older than stated age and no distress Neurologic: intact Heart: regular rate and rhythm, S1, S2 normal, no murmur, click, rub or gallop and normal apical impulse Lungs: clear to auscultation bilaterally and normal percussion bilaterally Abdomen: soft, non-tender; bowel sounds normal; no masses,  no organomegaly Extremities: edema mild bil pedal edema and Homans sign is negative, no sign of DVT Wound:  Patient has no carotid bruits no cervical supraclavicular adenopathy is appreciated, especially in the area noted on that scan in the right axilla   Diagnostic Studies & Laboratory data:     Recent Radiology Findings:  Ct Chest Wo Contrast  01/02/2013   *RADIOLOGY REPORT*  Clinical Data: Right lung nodule.  History of breast cancer.  CT CHEST WITHOUT CONTRAST  Technique:  Multidetector CT imaging of the chest was performed following the standard protocol without IV contrast.  Comparison: Chest CT 04/22/2012.  Findings: The right middle lobe pulmonary nodule is unchanged. Measures 10.7 x 6.9 mm and previous measure 10.3 x 7.4 mm.  No new nodules are identified.  No acute pulmonary findings.  No pleural effusion.  The heart is normal in size.  No pericardial effusion.  No mediastinal or hilar lymphadenopathy.  Small scattered lymph nodes are stable.  The aorta is normal in caliber.  The esophagus is grossly normal.  The chest wall is unremarkable.  Stable calcifications of the right breast.  No supraclavicular or axillary adenopathy.  Stable bilateral axillary lymph nodes.  The thyroid gland is normal.  The bony thorax is intact.  IMPRESSION:  Stable right middle lobe pulmonary nodule.  Recommend follow-up noncontrast chest CT in 1 year to document stability.   Original Report Authenticated By: Rudie Meyer, M.D.     Ct Chest W  Contrast  04/22/2012  *RADIOLOGY REPORT*  Clinical Data: Follow-up pulmonary nodule.  CT CHEST WITH CONTRAST  Technique:  Multidetector CT imaging of the chest was performed following the standard protocol during bolus administration of intravenous contrast.  Contrast: 80mL OMNIPAQUE IOHEXOL 300 MG/ML  SOLN  Comparison: PET 11/29/2011 and CT chest 11/01/2011.  Findings: No pathologically enlarged mediastinal, hilar or axillary lymph nodes.  Heart size normal.  No pericardial effusion.  A nodule in the right middle lobe measures 10 x 7 mm, stable from 11/01/2011 when remeasured.  A tiny subpleural nodular density in the right lower lobe measures 3 mm (image 42), stable.  No pleural fluid.  Airway is unremarkable.  Incidental imaging of the upper abdomen shows low attenuation throughout the visualized portion of the liver.  No worrisome lytic or sclerotic lesions.  IMPRESSION: Right middle lobe nodule is unchanged from 11/01/2011.  Given mild hypermetabolism on 11/29/2011, a follow-up interval of 6 months is recommended to confirm continued stability.   Original Report Authenticated By: Reyes Ivan, M.D.      11/09/2011 RADIOLOGY REPORT*  Clinical Data: Right basilar pulmonary nodule on abdominal CT.  Dysphagia. The patient has diabetes mellitus and unspecified  mental retardation.  CT CHEST WITH CONTRAST  Technique: Multidetector CT imaging of the chest was performed  following the standard protocol during bolus administration of  intravenous contrast.  Contrast: 80mL OMNIPAQUE IOHEXOL 300 MG/ML SOLN  Comparison: Abdominal CTs 10/10/2011 and 01/19/2005. Chest  radiographs 10/23/2001.  Findings: As demonstrated on the recent abdominal CT, there is a  lobulated well circumscribed 10 x 9 mm right middle lobe nodule on  image 35. This is noncalcified. This area of the lung bases was  not  imaged on the 2006 CT. No other pulmonary nodules are  identified. There is no airspace disease or confluent  airspace  opacity.  There are no enlarged mediastinal or hilar lymph nodes. There is  no pleural or pericardial effusion. Minimal aortic atherosclerosis  is noted.  Images through the upper abdomen demonstrate diffuse hepatic  steatosis. There is no adrenal mass.  IMPRESSION:  1. Lobulated noncalcified 1-cm right middle lobe nodule was not  imaged on prior abdominal CT. This nodule is not conclusively  demonstrated on radiographs such that stability cannot be  addressed.  2. This nodule is nonspecific and could reflect a hamartoma or  other benign lesion. However, malignancy cannot be excluded.  3. No other nodules, adenopathy or acute findings.  This nodule would be difficult to biopsy given its close proximity  to the heart. Management options include PET CT to assess for  hypermetabolic activity and follow-up CT (depending on risk factors  for malignancy). If follow-up is elected, follow-up chest CT at 3  months is recommended. This recommendation is adapted from the  consensus statement: Guidelines for Management of Small Pulmonary  Nodules Detected on CT Scans: A Statement from the Fleischner  Society as published in Radiology 2005; 237:395-400.  Original Report Authenticated By: Gerrianne Scale, M.D.  Kary Kos, MD Tue Jan 09, 2012 2:52:39 PM EDT       **ADDENDUM** CREATED: 01/09/2012 14:43:25  Voice recognition errors:  Within the Findings section, the second sentence of the CHEST  section should read: "NO additional hypermetabolic nodules are  present."  Within the first IMPRESSION, the last phrase of the first sentence  should read: "for a nodule this SIZE."  Findings discussed with Dr. Tyrone Sage on 07/ 08/2011  **END ADDENDUM** SIGNED BY: Genevive Bi, M.D.      Study Result     *RADIOLOGY REPORT*  Clinical Data: Recent imaging demonstrates a solitary pulmonary  nodule. FDG PET CT requested to evaluate for possible malignancy.  NUCLEAR MEDICINE PET  SKULL BASE TO THIGH  Fasting Blood Glucose: 113  Technique: 18.4 mCi F-18 FDG was injected intravenously via the  right antecubital fossa. CT data was obtained and used for  attenuation correction and anatomic localization only. (This was  not acquired as a diagnostic CT examination.) Additional exam  technical data entered on technologist worksheet.  Comparison: CT 11/01/2011  Findings:  Head/Neck: No hypermetabolic lymph nodes in the neck.  Chest: Within the right middle lobe there is a 8 mm pulmonary  nodule adjacent to the right atrium with mild metabolic activity (  SUV max = 2.5). Additional hypermetabolic nodules are present. No  hypermetabolic mediastinal lymph nodes.  There is a single hypermetabolic right axillary lymph node which is  normal pathology. There is misregistration in the right axilla due  to patient motion.  Abdomen/Pelvis: No abnormal hypermetabolic activity within the  liver, pancreas, adrenal glands, or spleen. No hypermetabolic  lymph nodes in the abdomen or pelvis.  Skeleton: No focal hypermetabolic activity to suggest skeletal  metastasis. Sclerosis of the left SI joint appears benign.  IMPRESSION:  1. Mildly hypermetabolic nodule within the right middle lobe.  Although the activity is mild, for a nodule this side this is  concerning although nonspecific. If biopsy is not feasible due to  location, recommend short-term follow-up CT with contrast in 1 to  3 months.  2. No evidence of metastasis.  3. Hypermetabolic right axial lymph node is likely reactive.  Original Report Authenticated By: Roseanne Reno  Amil Amen, M.D.       Recent Lab Findings: Lab Results  Component Value Date   WBC 4.4 11/13/2012   HGB 12.4 11/13/2012   HCT 35.6* 11/13/2012   PLT 217 11/13/2012   GLUCOSE 87 11/13/2012   CHOL  Value: 147        ATP III CLASSIFICATION:  <200     mg/dL   Desirable  161-096  mg/dL   Borderline High  >=045    mg/dL   High 40/98/1191   TRIG 90 05/23/2008   HDL 51  05/23/2008   LDLCALC  Value: 78        Total Cholesterol/HDL:CHD Risk Coronary Heart Disease Risk Table                     Men   Women  1/2 Average Risk   3.4   3.3 05/23/2008   ALT 16 11/13/2012   AST 30 11/13/2012   NA 138 11/13/2012   K 4.0 11/13/2012   CL 96 11/13/2012   CREATININE 0.96 11/13/2012   BUN 7 11/13/2012   CO2 34* 11/13/2012      Assessment / Plan:   9 mm right middle lobe lung nodule in a smoker abutting the right margin of the heart. There have been some delays in the patient's followup scans, but she returns today with a completed CT scan of the chest. The right middle lobe nodule appears unchanged since the scan 8 months ago. Unfortunately the patient continues to smoke and in spite of attempts to have her stop it appears unlikely that she will be able to. Since there is and no change in the size of the nodule over 8 months, I recommended to her that we repeat the scan in one year. I have again reviewed with her the need to stop smoking. However all poor medical condition and inability even to complete pulmonary function studies would make any type of surgical resection very difficult.          Delight Ovens MD  [facility] [phone] Office [phone] 01/02/2013 12:41 PM

## 2013-01-06 ENCOUNTER — Other Ambulatory Visit: Payer: PRIVATE HEALTH INSURANCE

## 2013-01-30 ENCOUNTER — Ambulatory Visit (INDEPENDENT_AMBULATORY_CARE_PROVIDER_SITE_OTHER): Payer: PRIVATE HEALTH INSURANCE | Admitting: Gastroenterology

## 2013-01-30 ENCOUNTER — Encounter: Payer: Self-pay | Admitting: Gastroenterology

## 2013-01-30 VITALS — BP 121/81 | HR 81 | Temp 98.4°F | Ht 70.5 in | Wt 237.6 lb

## 2013-01-30 DIAGNOSIS — R112 Nausea with vomiting, unspecified: Secondary | ICD-10-CM

## 2013-01-30 DIAGNOSIS — R1013 Epigastric pain: Secondary | ICD-10-CM

## 2013-01-30 DIAGNOSIS — R195 Other fecal abnormalities: Secondary | ICD-10-CM

## 2013-01-30 MED ORDER — DICYCLOMINE HCL 10 MG PO CAPS: 10.0000 mg | ORAL_CAPSULE | Freq: Three times a day (TID) | ORAL | Status: DC

## 2013-01-30 NOTE — Assessment & Plan Note (Addendum)
52 year old female with continued epigastric pain after eating, nausea, vomiting, and documented weight loss since last year. [place] of abdomen in May with fatty liver and unrevealing labs to include CBC, HFP, lipase. Last EGD in 2011 with Schatzki's ring s/p 58 F dilation. Complaints of solid food dysphagia also noted, likely with Schatzki's ring as the culprit. Recommend EGD with dilation in near future; proceed with HIDA scan if EGD negative for process that would explain epigastric pain. Gallbladder remains in situ.  Proceed with upper endoscopy and dilation in the near future with Dr. Jena Gauss. The risks, benefits, and alternatives have been discussed in detail with patient. They have stated understanding and desire to proceed.  Continue Nexium BID.  Follow-up with PCP regarding abdominal wound, ?secondary to boil/infection that spontaneously drained

## 2013-01-30 NOTE — Assessment & Plan Note (Signed)
Check stool studies as previously recommended. TSH today. Start Bentyl. May ultimately need colonoscopy in future, as last was in 2011 and poor prep; remote hx of adenomatous polyps.

## 2013-01-30 NOTE — Assessment & Plan Note (Signed)
EGD/ED as planned. HIDA scan if EGD unrevealing.

## 2013-01-30 NOTE — Patient Instructions (Addendum)
Please complete the stool studies as soon as possible and return to the lab. Please have blood work done.  Start taking Bentyl with meals and at bedtime. Monitor for confusion, constipation, dizziness. This is for loose stool and belly cramping.  We have set you up for an upper endoscopy and possible dilation with Dr. Jena Gauss in the near future. You will need a colonoscopy in the near future if the loose stools do not get better.

## 2013-01-30 NOTE — Progress Notes (Signed)
Referring Provider: Babs Sciara, MD Primary Care Physician:  [name], MD  Primary GI: Dr. Jena Gauss    Chief Complaint  Patient presents with  . Nausea  . Emesis  . Diarrhea    HPI:   [name] presents today with a history of IBS, Schatzki's ring s/p 58 F dilation in 2011, chronic abdominal pain, N/V. [place] of abdomen this year with fatty liver, labs to include CBC, HFP, lipase unimpressive in May 2014.    Weight 260 in July 2013, now 237, which is  2 lbs down from June 2014 at her last visit. She was set up for an EGD/ED in June 2014 after I saw her, but this was cancelled due to transportation issues. I had also ordered stool studies, which were not completed. Last TCS in 2011 with history of adenomatous polyps in remote past. Poor prep at that time.   States left-sided/periumbilical pain with eating. Associated nausea and vomiting. Tolerates veggies. Fried foods cause issues. Notes solid food dysphagia, specifically meats. Vomiting up to 4 times a day. No melena. No NSAIDs or aspirin powders. No rectal bleeding.  Sometimes up to 4-5 loose stools per day. Cramping prior.    Past Medical History  Diagnosis Date  . Mental retardation   . Hyperglycemia   . HTN (hypertension)   . Migraines   . Anxiety   . IBS (irritable bowel syndrome)   . Diabetes mellitus   . Lung nodules     right, followed by PCP, PET 11/2011    Past Surgical History  Procedure Laterality Date  . Abdominal hysterectomy    . Foot surgery    . External ear surgery      bilateral  . Glaucoma surgery    . Colonoscopy  11/2007    hyperplastic polyps, prior hx of adenomas   . Small bowel capsule  10/2007    normal  . Esophagogastroduodenoscopy  08/2007    moderate sized hiatal hernia  . Colonoscopy  05/2010    incomplete due to poor prep, hyperplastic rectal polyp  . Esophagogastroduodenoscopy  05/2010    noncritical schatzki ring s/p 23F  . Colonoscopy  05/05/2002    Dimunitive polyps in the rectum  and left colon, cold    biopsied/removed.  Scattered few left-sided diverticula.  Regular colonic   mucosa appeared normal    Current Outpatient Prescriptions  Medication Sig Dispense Refill  . ALPRAZolam (XANAX) 0.25 MG tablet Take 0.25 mg by mouth 2 (two) times daily as needed. For anxiety      . atomoxetine (STRATTERA) 100 MG capsule Take 100 mg by mouth daily.        Marland Kitchen buPROPion (WELLBUTRIN SR) 150 MG 12 hr tablet Take 150 mg by mouth 2 (two) times daily.       . calcium-vitamin D (OSCAL WITH D) 500-200 MG-UNIT per tablet Take 1 tablet by mouth 3 (three) times daily.        Marland Kitchen esomeprazole (NEXIUM) 40 MG capsule Take 40 mg by mouth 2 (two) times daily.      . furosemide (LASIX) 40 MG tablet Take 80 mg by mouth daily.        . hydrOXYzine (ATARAX) 50 MG tablet Take 50 mg by mouth daily.        . hydrOXYzine (VISTARIL) 50 MG capsule 1 capsule 2 (two) times daily.      Marland Kitchen lamoTRIgine (LAMICTAL) 100 MG tablet Take 100 mg by mouth daily.        Marland Kitchen  lisinopril (PRINIVIL,ZESTRIL) 5 MG tablet Take 5 mg by mouth daily.        . mirtazapine (REMERON) 30 MG tablet Take 30 mg by mouth at bedtime.        . nabumetone (RELAFEN) 500 MG tablet TAKE (1) TABLET BY MOUTH TWICE DAILY.  60 tablet  2  . Olopatadine HCl (PATADAY) 0.2 % SOLN Place 4 drops into both eyes 3 (three) times daily.       . ondansetron (ZOFRAN) 4 MG tablet Take 1 tablet (4 mg total) by mouth every 8 (eight) hours as needed for nausea.  20 tablet  0  . paliperidone (INVEGA) 9 MG 24 hr tablet Take 9 mg by mouth every morning.        . potassium chloride (K-DUR) 10 MEQ tablet 1 tablet 2 (two) times daily.      . Probiotic Product (ALIGN) 4 MG CAPS Take 4 mg by mouth daily.      . sertraline (ZOLOFT) 100 MG tablet Take 200 mg by mouth daily.        Marland Kitchen torsemide (DEMADEX) 20 MG tablet Take 20 mg by mouth daily.      . traMADol (ULTRAM) 50 MG tablet Take 1 tablet (50 mg total) by mouth every 6 (six) hours as needed for pain.  20 tablet  0  .  Vortioxetine HBr (BRINTELLIX) 10 MG TABS Take 10 mg by mouth.      . traZODone (DESYREL) 100 MG tablet Take 100 mg by mouth at bedtime.        No current facility-administered medications for this visit.    Allergies as of 01/30/2013 - Review Complete 01/30/2013  Allergen Reaction Noted  . Thorazine (chlorpromazine hcl) Anaphylaxis 10/20/2010  . Acetaminophen Other (See Comments)   . Aspirin Other (See Comments)   . Aspirin-acetaminophen-caffeine Other (See Comments)   . Nsaids Nausea And Vomiting   . Penicillins Nausea And Vomiting   . Tomato Rash 01/17/2012    Family History  Problem Relation Age of Onset  . Colon cancer      aunt  . Liver disease Neg Hx   . Inflammatory bowel disease Neg Hx   . Stroke Mother   . Heart attack Father   . Obesity    . COPD    . GER disease    . Diabetes type II    . Anxiety disorder    . Depression      History   Social History  . Marital Status: Divorced    Spouse Name: N/A    Number of Children: N/A  . Years of Education: N/A   Occupational History  . disabled    Social History Main Topics  . Smoking status: Current Every Day Smoker -- 0.25 packs/day for 40 years    Types: Cigarettes  . Smokeless tobacco: Never Used  . Alcohol Use: No     Comment: social drinker  . Drug Use: No  . Sexually Active: Yes -- Female partner(s)    Birth Control/ Protection: None     Comment: boyfriend   Other Topics Concern  . None   Social History Narrative  . None    Review of Systems: Negative unless mentioned in HPI.   Physical Exam: BP 121/81  Pulse 81  Temp(Src) 98.4 F (36.9 C) (Oral)  Ht 5' 10.5" (1.791 m)  Wt 237 lb 9.6 oz (107.775 kg)  BMI 33.6 kg/m2 General:   Alert and oriented. No distress noted. Pleasant and cooperative.  Head:  Normocephalic and atraumatic. Eyes:  Conjuctiva clear without scleral icterus. Mouth:  edentulous Neck:  Supple, without mass or thyromegaly. Heart:  S1, S2 present without murmurs, rubs,  or gallops. Regular rate and rhythm. Abdomen:  +BS, soft, mild TTP epigastric region and non-distended. No rebound or guarding. No HSM or masses noted. LLQ with open superficial wound, about pencil-eraser size in diameter, with granulation tissue noted. Patient notes present for month. No drainage Msk:  Symmetrical without gross deformities. Normal posture. Extremities:  Without edema. Neurologic:  Alert and  oriented x4;  grossly normal neurologically. Skin:  Intact without significant lesions or rashes. Cervical Nodes:  No significant cervical adenopathy. Psych:  Alert and cooperative. Normal mood and affect.

## 2013-01-31 ENCOUNTER — Encounter: Payer: Self-pay | Admitting: Family Medicine

## 2013-01-31 ENCOUNTER — Ambulatory Visit (INDEPENDENT_AMBULATORY_CARE_PROVIDER_SITE_OTHER): Payer: PRIVATE HEALTH INSURANCE | Admitting: Family Medicine

## 2013-01-31 VITALS — BP 138/88 | Wt 235.8 lb

## 2013-01-31 DIAGNOSIS — E782 Mixed hyperlipidemia: Secondary | ICD-10-CM

## 2013-01-31 DIAGNOSIS — E119 Type 2 diabetes mellitus without complications: Secondary | ICD-10-CM

## 2013-01-31 DIAGNOSIS — Z79899 Other long term (current) drug therapy: Secondary | ICD-10-CM

## 2013-01-31 MED ORDER — MUPIROCIN 2 % EX OINT: TOPICAL_OINTMENT | CUTANEOUS | Status: DC

## 2013-01-31 NOTE — Progress Notes (Signed)
  Subjective:    Patient ID: [name], female    DOB: 10-17-1960, 52 y.o.   MRN: [id number]  Diabetes She presents for her follow-up diabetic visit. She has type 2 diabetes mellitus.  Hypertension This is a chronic problem. The current episode started more than 1 year ago.   The patient brings in her medication list she also brings in her medicines plus also we reviewed over the electronic record. This took an extensive amount of time making sure that her medicines were correct. Patient does have mental impairment so very important that medicine list be accurate. She denies sweats chills vomiting diarrhea. She denies excessive excessive thirst joint pain chest pressure abdominal pain. Denies rectal bleeding.  She is trying eat healthy to a degree but she is very limited in her food resources. She does not check her sugars on a regular basis. She does take her medicines but does not check blood pressure outside the office Patient really does not have many friends she is followed by psychiatry for her depression past medical history see above. Her problem list is extensive the patient has a tendency to exaggerate her symptoms. Family history reviewed social history She smokes. Little she's been encouraged to quit Review of Systems See above    Objective:   Physical Exam Neck no masses. Lungs are clear no crackles Heart is regular Abdomen soft no guarding rebound Extremities no edema foot exam normal       Assessment & Plan:  History of hyperglycemia-her sugar I believe is doing great A1c looks good encourage healthy diet no medications indicated Hypertension decent control currently continue current measures Arthralgias Relafen helps she may use that when necessary Patient followup in 3-4 months.

## 2013-01-31 NOTE — Progress Notes (Signed)
CC PCP 

## 2013-02-01 ENCOUNTER — Emergency Department (HOSPITAL_COMMUNITY)
Admission: EM | Admit: 2013-02-01 | Discharge: 2013-02-01 | Disposition: A | Discharge status: Home / Self Care | Payer: PRIVATE HEALTH INSURANCE | Attending: Emergency Medicine | Admitting: Emergency Medicine

## 2013-02-01 ENCOUNTER — Encounter (HOSPITAL_COMMUNITY): Payer: Self-pay

## 2013-02-01 DIAGNOSIS — R51 Headache: Secondary | ICD-10-CM | POA: Insufficient documentation

## 2013-02-01 DIAGNOSIS — F419 Anxiety disorder, unspecified: Secondary | ICD-10-CM

## 2013-02-01 DIAGNOSIS — R42 Dizziness and giddiness: Secondary | ICD-10-CM | POA: Insufficient documentation

## 2013-02-01 DIAGNOSIS — E119 Type 2 diabetes mellitus without complications: Secondary | ICD-10-CM | POA: Insufficient documentation

## 2013-02-01 DIAGNOSIS — E876 Hypokalemia: Secondary | ICD-10-CM

## 2013-02-01 DIAGNOSIS — I10 Essential (primary) hypertension: Secondary | ICD-10-CM | POA: Insufficient documentation

## 2013-02-01 DIAGNOSIS — F411 Generalized anxiety disorder: Secondary | ICD-10-CM | POA: Insufficient documentation

## 2013-02-01 DIAGNOSIS — Z8719 Personal history of other diseases of the digestive system: Secondary | ICD-10-CM | POA: Insufficient documentation

## 2013-02-01 DIAGNOSIS — Z8639 Personal history of other endocrine, nutritional and metabolic disease: Secondary | ICD-10-CM | POA: Insufficient documentation

## 2013-02-01 DIAGNOSIS — Z8679 Personal history of other diseases of the circulatory system: Secondary | ICD-10-CM | POA: Insufficient documentation

## 2013-02-01 DIAGNOSIS — Z8659 Personal history of other mental and behavioral disorders: Secondary | ICD-10-CM | POA: Insufficient documentation

## 2013-02-01 DIAGNOSIS — Z880 Allergy status to penicillin: Secondary | ICD-10-CM | POA: Insufficient documentation

## 2013-02-01 DIAGNOSIS — Z79899 Other long term (current) drug therapy: Secondary | ICD-10-CM | POA: Insufficient documentation

## 2013-02-01 DIAGNOSIS — F17200 Nicotine dependence, unspecified, uncomplicated: Secondary | ICD-10-CM | POA: Insufficient documentation

## 2013-02-01 DIAGNOSIS — Z862 Personal history of diseases of the blood and blood-forming organs and certain disorders involving the immune mechanism: Secondary | ICD-10-CM | POA: Insufficient documentation

## 2013-02-01 DIAGNOSIS — F22 Delusional disorders: Secondary | ICD-10-CM | POA: Insufficient documentation

## 2013-02-01 LAB — BASIC METABOLIC PANEL
BUN: 8 mg/dL (ref 6–23)
Chloride: 93 mEq/L — ABNORMAL LOW (ref 96–112)
GFR calc Af Amer: >90 mL/min (ref >90)
GFR calc non Af Amer: >90 mL/min (ref >90)
Glucose, Bld: 87 mg/dL (ref 70–99)
Potassium: 3.2 mEq/L — ABNORMAL LOW (ref 3.5–5.1)
Sodium: 134 mEq/L — ABNORMAL LOW (ref 135–145)

## 2013-02-01 LAB — URINALYSIS, ROUTINE W REFLEX MICROSCOPIC
Hgb urine dipstick: NEGATIVE
Nitrite: NEGATIVE
Specific Gravity, Urine: 1.01 (ref 1.005–1.030)
Urobilinogen, UA: 0.2 mg/dL (ref 0.0–1.0)
pH: 6.5 (ref 5.0–8.0)

## 2013-02-01 MED ORDER — HYDROCODONE-ACETAMINOPHEN 5-325 MG PO TABS: 2.0000 | ORAL_TABLET | Freq: Once | ORAL | Status: DC

## 2013-02-01 MED ORDER — METOCLOPRAMIDE HCL 10 MG PO TABS: 10.0000 mg | ORAL_TABLET | Freq: Once | ORAL | Status: DC

## 2013-02-01 MED ORDER — POTASSIUM CHLORIDE CRYS ER 20 MEQ PO TBCR
40.0000 meq | EXTENDED_RELEASE_TABLET | Freq: Once | ORAL | Status: AC
  Administered 2013-02-01: 40 meq via ORAL
  Filled 2013-02-01: qty 2

## 2013-02-01 MED ORDER — LORAZEPAM 1 MG PO TABS
1.0000 mg | ORAL_TABLET | Freq: Once | ORAL | Status: AC
  Administered 2013-02-01: 1 mg via ORAL
  Filled 2013-02-01: qty 1
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
## 2013-02-01 NOTE — ED Notes (Signed)
Pt presents with c/o feeling anxious today worsening in the past 2-3 hrs.  Pt reports has been lying in bed all day. Has xanax at home, ' just didn't take them". No other c/o voiced. Pt denies pain at this time.  Pt noted laying calmly on bed, legs crossed talking on cell phone with no distress or anxious behavior noted.

## 2013-02-01 NOTE — ED Notes (Signed)
Patient states that sometimes she gets scared.

## 2013-02-01 NOTE — ED Provider Notes (Signed)
Medical screening examination/treatment/procedure(s) were performed by non-physician practitioner and as supervising physician I was immediately available for consultation/collaboration. Devoria Albe, MD, Armando Gang   Ward Givens, MD 02/01/13 [phone]
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
## 2013-02-03 ENCOUNTER — Encounter (HOSPITAL_COMMUNITY): Payer: Self-pay | Admitting: Pharmacy Technician

## 2013-02-06 ENCOUNTER — Encounter (HOSPITAL_COMMUNITY): Payer: Self-pay

## 2013-02-06 ENCOUNTER — Encounter (HOSPITAL_COMMUNITY)
Admission: RE | Disposition: A | Discharge status: Home / Self Care | Payer: Self-pay | Source: Ambulatory Visit | Attending: Internal Medicine

## 2013-02-06 ENCOUNTER — Ambulatory Visit (HOSPITAL_COMMUNITY)
Admission: RE | Admit: 2013-02-06 | Discharge: 2013-02-06 | Disposition: A | Discharge status: Home / Self Care | Payer: PRIVATE HEALTH INSURANCE | Source: Ambulatory Visit | Attending: Internal Medicine | Admitting: Internal Medicine

## 2013-02-06 DIAGNOSIS — K449 Diaphragmatic hernia without obstruction or gangrene: Secondary | ICD-10-CM

## 2013-02-06 DIAGNOSIS — R1310 Dysphagia, unspecified: Secondary | ICD-10-CM

## 2013-02-06 DIAGNOSIS — R197 Diarrhea, unspecified: Secondary | ICD-10-CM | POA: Insufficient documentation

## 2013-02-06 DIAGNOSIS — Z79899 Other long term (current) drug therapy: Secondary | ICD-10-CM | POA: Insufficient documentation

## 2013-02-06 DIAGNOSIS — R1013 Epigastric pain: Secondary | ICD-10-CM

## 2013-02-06 DIAGNOSIS — K7689 Other specified diseases of liver: Secondary | ICD-10-CM | POA: Insufficient documentation

## 2013-02-06 DIAGNOSIS — F411 Generalized anxiety disorder: Secondary | ICD-10-CM | POA: Insufficient documentation

## 2013-02-06 DIAGNOSIS — Z880 Allergy status to penicillin: Secondary | ICD-10-CM | POA: Insufficient documentation

## 2013-02-06 DIAGNOSIS — I10 Essential (primary) hypertension: Secondary | ICD-10-CM | POA: Insufficient documentation

## 2013-02-06 DIAGNOSIS — Z886 Allergy status to analgesic agent status: Secondary | ICD-10-CM | POA: Insufficient documentation

## 2013-02-06 DIAGNOSIS — K589 Irritable bowel syndrome without diarrhea: Secondary | ICD-10-CM | POA: Insufficient documentation

## 2013-02-06 DIAGNOSIS — Z800 Family history of malignant neoplasm of digestive organs: Secondary | ICD-10-CM | POA: Insufficient documentation

## 2013-02-06 DIAGNOSIS — F17200 Nicotine dependence, unspecified, uncomplicated: Secondary | ICD-10-CM | POA: Insufficient documentation

## 2013-02-06 DIAGNOSIS — G43909 Migraine, unspecified, not intractable, without status migrainosus: Secondary | ICD-10-CM | POA: Insufficient documentation

## 2013-02-06 DIAGNOSIS — E119 Type 2 diabetes mellitus without complications: Secondary | ICD-10-CM | POA: Insufficient documentation

## 2013-02-06 DIAGNOSIS — R918 Other nonspecific abnormal finding of lung field: Secondary | ICD-10-CM | POA: Insufficient documentation

## 2013-02-06 DIAGNOSIS — Z91018 Allergy to other foods: Secondary | ICD-10-CM | POA: Insufficient documentation

## 2013-02-06 DIAGNOSIS — F79 Unspecified intellectual disabilities: Secondary | ICD-10-CM | POA: Insufficient documentation

## 2013-02-06 DIAGNOSIS — R195 Other fecal abnormalities: Secondary | ICD-10-CM

## 2013-02-06 DIAGNOSIS — Z888 Allergy status to other drugs, medicaments and biological substances status: Secondary | ICD-10-CM | POA: Insufficient documentation

## 2013-02-06 DIAGNOSIS — Q391 Atresia of esophagus with tracheo-esophageal fistula: Secondary | ICD-10-CM | POA: Insufficient documentation

## 2013-02-06 DIAGNOSIS — R112 Nausea with vomiting, unspecified: Secondary | ICD-10-CM

## 2013-02-06 HISTORY — PX: ESOPHAGOGASTRODUODENOSCOPY (EGD) WITH ESOPHAGEAL DILATION: SHX5812

## 2013-02-06 SURGERY — ESOPHAGOGASTRODUODENOSCOPY (EGD) WITH ESOPHAGEAL DILATION
Anesthesia: Moderate Sedation

## 2013-02-06 MED ORDER — ONDANSETRON HCL 4 MG/2ML IJ SOLN
INTRAMUSCULAR | Status: DC | PRN
  Administered 2013-02-06: 4 mg via INTRAVENOUS

## 2013-02-06 MED ORDER — ONDANSETRON HCL 4 MG/2ML IJ SOLN
INTRAMUSCULAR | Status: AC
  Filled 2013-02-06: qty 2

## 2013-02-06 MED ORDER — STERILE WATER FOR IRRIGATION IR SOLN
Status: DC | PRN
  Administered 2013-02-06: 15:00:00

## 2013-02-06 MED ORDER — MIDAZOLAM HCL 5 MG/5ML IJ SOLN
INTRAMUSCULAR | Status: AC
  Filled 2013-02-06: qty 10

## 2013-02-06 MED ORDER — MIDAZOLAM HCL 5 MG/5ML IJ SOLN
INTRAMUSCULAR | Status: DC | PRN
  Administered 2013-02-06 (×2): 2 mg via INTRAVENOUS

## 2013-02-06 MED ORDER — MEPERIDINE HCL 100 MG/ML IJ SOLN
INTRAMUSCULAR | Status: DC | PRN
  Administered 2013-02-06: 25 mg via INTRAVENOUS
  Administered 2013-02-06: 50 mg via INTRAVENOUS

## 2013-02-06 MED ORDER — MEPERIDINE HCL 100 MG/ML IJ SOLN
INTRAMUSCULAR | Status: AC
  Filled 2013-02-06: qty 1

## 2013-02-06 MED ORDER — SODIUM CHLORIDE 0.9 % IV SOLN
INTRAVENOUS | Status: DC
  Administered 2013-02-06: 14:00:00 via INTRAVENOUS

## 2013-02-06 MED ORDER — BUTAMBEN-TETRACAINE-BENZOCAINE 2-2-14 % EX AERO
INHALATION_SPRAY | CUTANEOUS | Status: DC | PRN
  Administered 2013-02-06: 2 via TOPICAL

## 2013-02-06 NOTE — Interval H&P Note (Signed)
History and Physical Interval Note:  02/06/2013 3:09 PM  [name]  has presented today for surgery, with the diagnosis of EPIGASTRIC PAIN, NAUSEA AND VOMITING AND LOOSE STOOLS  The various methods of treatment have been discussed with the patient and family. After consideration of risks, benefits and other options for treatment, the patient has consented to  Procedure(s) with comments: ESOPHAGOGASTRODUODENOSCOPY (EGD) WITH ESOPHAGEAL DILATION (N/A) - 2:45 as a surgical intervention .  The patient's history has been reviewed, patient examined, no change in status, stable for surgery.  I have reviewed the patient's chart and labs.  Questions were answered to the patient's  satisfaction.    As above. No change. EGD with possible esophageal dilation.The risks, benefits, limitations, alternatives and imponderables have been reviewed with the patient. Potential for esophageal dilation, biopsy, etc. have also been reviewed.  Questions have been answered. All parties agreeable. [name]

## 2013-02-06 NOTE — H&P (View-Only) (Signed)
Referring Provider: Luking, Scott A, MD Primary Care Physician:  LUKING,SCOTT, MD  Primary GI: Dr. Rourk    Chief Complaint  Patient presents with  . Nausea  . Emesis  . Diarrhea    HPI:   [name] presents today with a history of IBS, Schatzki's ring s/p 56 F dilation in 2011, chronic abdominal pain, N/V. US of abdomen this year with fatty liver, labs to include CBC, HFP, lipase unimpressive in May 2014.    Weight 260 in July 2013, now 237, which is  2 lbs down from June 2014 at her last visit. She was set up for an EGD/ED in June 2014 after I saw her, but this was cancelled due to transportation issues. I had also ordered stool studies, which were not completed. Last TCS in 2011 with history of adenomatous polyps in remote past. Poor prep at that time.   States left-sided/periumbilical pain with eating. Associated nausea and vomiting. Tolerates veggies. Fried foods cause issues. Notes solid food dysphagia, specifically meats. Vomiting up to 4 times a day. No melena. No NSAIDs or aspirin powders. No rectal bleeding.  Sometimes up to 4-5 loose stools per day. Cramping prior.    Past Medical History  Diagnosis Date  . Mental retardation   . Hyperglycemia   . HTN (hypertension)   . Migraines   . Anxiety   . IBS (irritable bowel syndrome)   . Diabetes mellitus   . Lung nodules     right, followed by PCP, PET 11/2011    Past Surgical History  Procedure Laterality Date  . Abdominal hysterectomy    . Foot surgery    . External ear surgery      bilateral  . Glaucoma surgery    . Colonoscopy  11/2007    hyperplastic polyps, prior hx of adenomas   . Small bowel capsule  10/2007    normal  . Esophagogastroduodenoscopy  08/2007    moderate sized hiatal hernia  . Colonoscopy  05/2010    incomplete due to poor prep, hyperplastic rectal polyp  . Esophagogastroduodenoscopy  05/2010    noncritical schatzki ring s/p 56F  . Colonoscopy  05/05/2002    Dimunitive polyps in the rectum  and left colon, cold    biopsied/removed.  Scattered few left-sided diverticula.  Regular colonic   mucosa appeared normal    Current Outpatient Prescriptions  Medication Sig Dispense Refill  . ALPRAZolam (XANAX) 0.25 MG tablet Take 0.25 mg by mouth 2 (two) times daily as needed. For anxiety      . atomoxetine (STRATTERA) 100 MG capsule Take 100 mg by mouth daily.        . buPROPion (WELLBUTRIN SR) 150 MG 12 hr tablet Take 150 mg by mouth 2 (two) times daily.       . calcium-vitamin D (OSCAL WITH D) 500-200 MG-UNIT per tablet Take 1 tablet by mouth 3 (three) times daily.        . esomeprazole (NEXIUM) 40 MG capsule Take 40 mg by mouth 2 (two) times daily.      . furosemide (LASIX) 40 MG tablet Take 80 mg by mouth daily.        . hydrOXYzine (ATARAX) 50 MG tablet Take 50 mg by mouth daily.        . hydrOXYzine (VISTARIL) 50 MG capsule 1 capsule 2 (two) times daily.      . lamoTRIgine (LAMICTAL) 100 MG tablet Take 100 mg by mouth daily.        .   lisinopril (PRINIVIL,ZESTRIL) 5 MG tablet Take 5 mg by mouth daily.        . mirtazapine (REMERON) 30 MG tablet Take 30 mg by mouth at bedtime.        . nabumetone (RELAFEN) 500 MG tablet TAKE (1) TABLET BY MOUTH TWICE DAILY.  60 tablet  2  . Olopatadine HCl (PATADAY) 0.2 % SOLN Place 4 drops into both eyes 3 (three) times daily.       . ondansetron (ZOFRAN) 4 MG tablet Take 1 tablet (4 mg total) by mouth every 8 (eight) hours as needed for nausea.  20 tablet  0  . paliperidone (INVEGA) 9 MG 24 hr tablet Take 9 mg by mouth every morning.        . potassium chloride (K-DUR) 10 MEQ tablet 1 tablet 2 (two) times daily.      . Probiotic Product (ALIGN) 4 MG CAPS Take 4 mg by mouth daily.      . sertraline (ZOLOFT) 100 MG tablet Take 200 mg by mouth daily.        . torsemide (DEMADEX) 20 MG tablet Take 20 mg by mouth daily.      . traMADol (ULTRAM) 50 MG tablet Take 1 tablet (50 mg total) by mouth every 6 (six) hours as needed for pain.  20 tablet  0  .  Vortioxetine HBr (BRINTELLIX) 10 MG TABS Take 10 mg by mouth.      . traZODone (DESYREL) 100 MG tablet Take 100 mg by mouth at bedtime.        No current facility-administered medications for this visit.    Allergies as of 01/30/2013 - Review Complete 01/30/2013  Allergen Reaction Noted  . Thorazine (chlorpromazine hcl) Anaphylaxis 10/20/2010  . Acetaminophen Other (See Comments)   . Aspirin Other (See Comments)   . Aspirin-acetaminophen-caffeine Other (See Comments)   . Nsaids Nausea And Vomiting   . Penicillins Nausea And Vomiting   . Tomato Rash 01/17/2012    Family History  Problem Relation Age of Onset  . Colon cancer      aunt  . Liver disease Neg Hx   . Inflammatory bowel disease Neg Hx   . Stroke Mother   . Heart attack Father   . Obesity    . COPD    . GER disease    . Diabetes type II    . Anxiety disorder    . Depression      History   Social History  . Marital Status: Divorced    Spouse Name: N/A    Number of Children: N/A  . Years of Education: N/A   Occupational History  . disabled    Social History Main Topics  . Smoking status: Current Every Day Smoker -- 0.25 packs/day for 40 years    Types: Cigarettes  . Smokeless tobacco: Never Used  . Alcohol Use: No     Comment: social drinker  . Drug Use: No  . Sexually Active: Yes -- Female partner(s)    Birth Control/ Protection: None     Comment: boyfriend   Other Topics Concern  . None   Social History Narrative  . None    Review of Systems: Negative unless mentioned in HPI.   Physical Exam: BP 121/81  Pulse 81  Temp(Src) 98.4 F (36.9 C) (Oral)  Ht 5' 10.5" (1.791 m)  Wt 237 lb 9.6 oz (107.775 kg)  BMI 33.6 kg/m2 General:   Alert and oriented. No distress noted. Pleasant and cooperative.    Head:  Normocephalic and atraumatic. Eyes:  Conjuctiva clear without scleral icterus. Mouth:  edentulous Neck:  Supple, without mass or thyromegaly. Heart:  S1, S2 present without murmurs, rubs,  or gallops. Regular rate and rhythm. Abdomen:  +BS, soft, mild TTP epigastric region and non-distended. No rebound or guarding. No HSM or masses noted. LLQ with open superficial wound, about pencil-eraser size in diameter, with granulation tissue noted. Patient notes present for month. No drainage Msk:  Symmetrical without gross deformities. Normal posture. Extremities:  Without edema. Neurologic:  Alert and  oriented x4;  grossly normal neurologically. Skin:  Intact without significant lesions or rashes. Cervical Nodes:  No significant cervical adenopathy. Psych:  Alert and cooperative. Normal mood and affect.  

## 2013-02-06 NOTE — Op Note (Signed)
[facility] [address] [place], [address]   ENDOSCOPY PROCEDURE REPORT  PATIENT: [name], [name]  MR#: [id number] BIRTHDATE: 1961/03/17 , 52  yrs. old GENDER: Female ENDOSCOPIST: R.  Roetta Sessions, MD FACP FACG REFERRED BY:  [name], M.D. PROCEDURE DATE:  02/06/2013 PROCEDURE:     EGD with Elease Hashimoto dilation  INDICATIONS:      recurrent esophageal dysphagia in the setting of a known Schatzki's ring and epigastric pain  INFORMED CONSENT:   The risks, benefits, limitations, alternatives and imponderables have been discussed.  The potential for biopsy, esophogeal dilation, etc. have also been reviewed.  Questions have been answered.  All parties agreeable.  Please see the history and physical in the medical record for more information.  MEDICATIONS:   Versed 4 mg IV and Demerol 75 mg in divided doses. Zofran 4 mg  DESCRIPTION OF PROCEDURE:   The EG-[id number] ([id number])  endoscope was introduced through the mouth and advanced to the second portion of the duodenum without difficulty or limitations.  The mucosal surfaces were surveyed very carefully during advancement of the scope and upon withdrawal.  Retroflexion view of the proximal stomach and esophagogastric junction was performed.      FINDINGS: Normal-appearing, patent tubular esophagus. Stomach empty. Small hiatal hernia. Abnormal gastric mucosa. Patent pylorus. Normal first and second portion of the duodenum.  THERAPEUTIC / DIAGNOSTIC MANEUVERS PERFORMED:  A 56 French Maloney dilator was passed to full insertion easily Subsequently a 58 [place] Maloney dilator was passed to full insertion easily. A look back revealed no trauma whatsoever related to passage of these dilators.   COMPLICATIONS:  None  IMPRESSION:  Normal esophagus-status post dilation up to a 58 [place] size with [facility] dilators.  Hiatal hernia  RECOMMENDATIONS:    Continue Nexium 40 mg orally twice a day. Office followup with [place]  in 6-8 weeks to re-assess.    _______________________________ R. Roetta Sessions, MD FACP [facility] eSigned:  R. Roetta Sessions, MD FACP [facility] 02/06/2013 3:39 PM     CC:

## 2013-02-07 ENCOUNTER — Encounter (HOSPITAL_COMMUNITY): Payer: Self-pay | Admitting: Internal Medicine

## 2013-02-19 ENCOUNTER — Other Ambulatory Visit: Payer: Self-pay

## 2013-02-19 MED ORDER — ESOMEPRAZOLE MAGNESIUM 40 MG PO CPDR: 40.0000 mg | DELAYED_RELEASE_CAPSULE | Freq: Two times a day (BID) | ORAL | Status: DC

## 2013-02-24 ENCOUNTER — Emergency Department (HOSPITAL_COMMUNITY): Payer: PRIVATE HEALTH INSURANCE

## 2013-02-24 ENCOUNTER — Emergency Department (HOSPITAL_COMMUNITY)
Admission: EM | Admit: 2013-02-24 | Discharge: 2013-02-24 | Disposition: A | Discharge status: Home / Self Care | Payer: PRIVATE HEALTH INSURANCE | Attending: Emergency Medicine | Admitting: Emergency Medicine

## 2013-02-24 ENCOUNTER — Encounter (HOSPITAL_COMMUNITY): Payer: Self-pay | Admitting: Emergency Medicine

## 2013-02-24 DIAGNOSIS — G43909 Migraine, unspecified, not intractable, without status migrainosus: Secondary | ICD-10-CM | POA: Insufficient documentation

## 2013-02-24 DIAGNOSIS — Z8719 Personal history of other diseases of the digestive system: Secondary | ICD-10-CM | POA: Insufficient documentation

## 2013-02-24 DIAGNOSIS — E119 Type 2 diabetes mellitus without complications: Secondary | ICD-10-CM | POA: Insufficient documentation

## 2013-02-24 DIAGNOSIS — F17200 Nicotine dependence, unspecified, uncomplicated: Secondary | ICD-10-CM | POA: Insufficient documentation

## 2013-02-24 DIAGNOSIS — R079 Chest pain, unspecified: Secondary | ICD-10-CM | POA: Insufficient documentation

## 2013-02-24 DIAGNOSIS — K5900 Constipation, unspecified: Secondary | ICD-10-CM | POA: Insufficient documentation

## 2013-02-24 DIAGNOSIS — Z880 Allergy status to penicillin: Secondary | ICD-10-CM | POA: Insufficient documentation

## 2013-02-24 DIAGNOSIS — M79609 Pain in unspecified limb: Secondary | ICD-10-CM | POA: Insufficient documentation

## 2013-02-24 DIAGNOSIS — E876 Hypokalemia: Secondary | ICD-10-CM

## 2013-02-24 DIAGNOSIS — Z90710 Acquired absence of both cervix and uterus: Secondary | ICD-10-CM | POA: Insufficient documentation

## 2013-02-24 DIAGNOSIS — E669 Obesity, unspecified: Secondary | ICD-10-CM | POA: Insufficient documentation

## 2013-02-24 DIAGNOSIS — F79 Unspecified intellectual disabilities: Secondary | ICD-10-CM | POA: Insufficient documentation

## 2013-02-24 DIAGNOSIS — Z9889 Other specified postprocedural states: Secondary | ICD-10-CM | POA: Insufficient documentation

## 2013-02-24 DIAGNOSIS — I252 Old myocardial infarction: Secondary | ICD-10-CM | POA: Insufficient documentation

## 2013-02-24 DIAGNOSIS — Z79899 Other long term (current) drug therapy: Secondary | ICD-10-CM | POA: Insufficient documentation

## 2013-02-24 DIAGNOSIS — F411 Generalized anxiety disorder: Secondary | ICD-10-CM | POA: Insufficient documentation

## 2013-02-24 DIAGNOSIS — I10 Essential (primary) hypertension: Secondary | ICD-10-CM | POA: Insufficient documentation

## 2013-02-24 DIAGNOSIS — R112 Nausea with vomiting, unspecified: Secondary | ICD-10-CM | POA: Insufficient documentation

## 2013-02-24 DIAGNOSIS — R109 Unspecified abdominal pain: Secondary | ICD-10-CM | POA: Insufficient documentation

## 2013-02-24 HISTORY — DX: Acute myocardial infarction, unspecified: I21.9

## 2013-02-24 LAB — CBC WITH DIFFERENTIAL/PLATELET
Hemoglobin: 14.7 g/dL (ref 12.0–15.0)
Lymphocytes Relative: 45 % (ref 12–46)
Lymphs Abs: 2.6 10*3/uL (ref 0.7–4.0)
Monocytes Relative: 7 % (ref 3–12)
Neutrophils Relative %: 46 % (ref 43–77)
Platelets: 239 10*3/uL (ref 150–400)
RBC: 4.98 MIL/uL (ref 3.87–5.11)
WBC: 5.8 10*3/uL (ref 4.0–10.5)

## 2013-02-24 LAB — OCCULT BLOOD, POC DEVICE: Fecal Occult Bld: NEGATIVE

## 2013-02-24 LAB — URINALYSIS, ROUTINE W REFLEX MICROSCOPIC
Bilirubin Urine: NEGATIVE
Hgb urine dipstick: NEGATIVE
Nitrite: NEGATIVE
Protein, ur: NEGATIVE mg/dL
Specific Gravity, Urine: <1.005 — ABNORMAL LOW (ref 1.005–1.030)
Urobilinogen, UA: 0.2 mg/dL (ref 0.0–1.0)

## 2013-02-24 LAB — COMPREHENSIVE METABOLIC PANEL
ALT: 20 U/L (ref 0–35)
Alkaline Phosphatase: 104 U/L (ref 39–117)
CO2: 33 mEq/L — ABNORMAL HIGH (ref 19–32)
Chloride: 95 mEq/L — ABNORMAL LOW (ref 96–112)
GFR calc Af Amer: 90 mL/min — ABNORMAL LOW (ref >90)
GFR calc non Af Amer: 77 mL/min — ABNORMAL LOW (ref >90)
Glucose, Bld: 113 mg/dL — ABNORMAL HIGH (ref 70–99)
Potassium: 2.8 mEq/L — ABNORMAL LOW (ref 3.5–5.1)
Sodium: 131 mEq/L — ABNORMAL LOW (ref 135–145)
Total Bilirubin: 0.3 mg/dL (ref 0.3–1.2)

## 2013-02-24 LAB — TROPONIN I: Troponin I: <0.3 ng/mL (ref <0.30)

## 2013-02-24 LAB — LIPASE, BLOOD: Lipase: 88 U/L — ABNORMAL HIGH (ref 11–59)

## 2013-02-24 MED ORDER — ONDANSETRON HCL 4 MG/2ML IJ SOLN: 4.0000 mg | Freq: Once | INTRAMUSCULAR | Status: DC

## 2013-02-24 MED ORDER — HYDROMORPHONE HCL PF 1 MG/ML IJ SOLN: 1.0000 mg | Freq: Once | INTRAMUSCULAR | Status: DC

## 2013-02-24 MED ORDER — SODIUM CHLORIDE 0.9 % IV SOLN
INTRAVENOUS | Status: DC
  Administered 2013-02-24: 15:00:00 via INTRAVENOUS

## 2013-02-24 MED ORDER — HYDROMORPHONE HCL PF 1 MG/ML IJ SOLN
1.0000 mg | Freq: Once | INTRAMUSCULAR | Status: AC
  Administered 2013-02-24: 1 mg via INTRAVENOUS
  Filled 2013-02-24: qty 1

## 2013-02-24 MED ORDER — SODIUM CHLORIDE 0.9 % IV BOLUS (SEPSIS)
1000.0000 mL | Freq: Once | INTRAVENOUS | Status: AC
  Administered 2013-02-24: 1000 mL via INTRAVENOUS

## 2013-02-24 MED ORDER — POTASSIUM CHLORIDE CRYS ER 20 MEQ PO TBCR
40.0000 meq | EXTENDED_RELEASE_TABLET | Freq: Once | ORAL | Status: AC
  Administered 2013-02-24: 40 meq via ORAL
  Filled 2013-02-24: qty 2

## 2013-02-24 MED ORDER — METRONIDAZOLE 500 MG PO TABS: 500.0000 mg | ORAL_TABLET | Freq: Two times a day (BID) | ORAL | Status: DC

## 2013-02-24 MED ORDER — ONDANSETRON HCL 4 MG/2ML IJ SOLN
4.0000 mg | Freq: Once | INTRAMUSCULAR | Status: AC
  Administered 2013-02-24: 4 mg via INTRAVENOUS
  Filled 2013-02-24: qty 2

## 2013-02-24 NOTE — ED Notes (Signed)
States that she started having chest pain last night, states she is having epigastric abdominal pain with nausea and vomiting, back pain and constipation.

## 2013-02-24 NOTE — ED Provider Notes (Signed)
CSN: [id number]     Arrival date & time 02/24/13  1345 History     First MD Initiated Contact with Patient 02/24/13 1359     Chief Complaint  Patient presents with  . Chest Pain  . Abdominal Pain   (Consider location/radiation/quality/duration/timing/severity/associated sxs/prior Treatment) HPI Comments: [name] is a 52 y.o. Female who complains of abdominal pain for one week. Chest pain, for one day, nausea and vomiting for one day, and no bowel movement, for 3 weeks. She went to a restaurant today, but vomited, so came here for evaluation. She also has pain in her right arm. She any trauma. She states that she is taking her regular medications, without relief. There are no other known modifying factors.  Patient is a 52 y.o. female presenting with chest pain and abdominal pain. The history is provided by the patient.  Chest Pain Associated symptoms: abdominal pain   Abdominal Pain Associated symptoms: chest pain     Past Medical History  Diagnosis Date  . Mental retardation   . Hyperglycemia   . HTN (hypertension)   . Migraines   . Anxiety   . IBS (irritable bowel syndrome)   . Diabetes mellitus   . Lung nodules     right, followed by PCP, PET 11/2011  . MI (myocardial infarction)    Past Surgical History  Procedure Laterality Date  . Abdominal hysterectomy    . Foot surgery    . External ear surgery      bilateral  . Glaucoma surgery    . Colonoscopy  11/2007    hyperplastic polyps, prior hx of adenomas   . Small bowel capsule  10/2007    normal  . Esophagogastroduodenoscopy  08/2007    moderate sized hiatal hernia  . Colonoscopy  05/2010    incomplete due to poor prep, hyperplastic rectal polyp  . Esophagogastroduodenoscopy  05/2010    noncritical schatzki ring s/p 57F  . Colonoscopy  05/05/2002    Dimunitive polyps in the rectum and left colon, cold    biopsied/removed.  Scattered few left-sided diverticula.  Regular colonic   mucosa appeared normal  .  Esophagogastroduodenoscopy (egd) with esophageal dilation N/A 02/06/2013    Procedure: ESOPHAGOGASTRODUODENOSCOPY (EGD) WITH ESOPHAGEAL DILATION;  Surgeon: Corbin Ade, MD;  Location: [facility] ENDO SUITE;  Service: Endoscopy;  Laterality: N/A;  2:45   Family History  Problem Relation Age of Onset  . Colon cancer      aunt  . Liver disease Neg Hx   . Inflammatory bowel disease Neg Hx   . Stroke Mother   . Heart attack Father   . Obesity    . COPD    . GER disease    . Diabetes type II    . Anxiety disorder    . Depression     History  Substance Use Topics  . Smoking status: Current Every Day Smoker -- 0.25 packs/day for 40 years    Types: Cigarettes  . Smokeless tobacco: Never Used  . Alcohol Use: No     Comment: social drinker   OB History   Grav Para Term Preterm Abortions TAB SAB Ect Mult Living                 Review of Systems  Cardiovascular: Positive for chest pain.  Gastrointestinal: Positive for abdominal pain.  All other systems reviewed and are negative.    Allergies  Thorazine; Acetaminophen; Aspirin; Aspirin-acetaminophen-caffeine; Nsaids; Penicillins; and Tomato  Home Medications  Current Outpatient Rx  Name  Route  Sig  Dispense  Refill  . ALPRAZolam (XANAX) 0.25 MG tablet   Oral   Take 0.25 mg by mouth 2 (two) times daily as needed. For anxiety         . buPROPion (WELLBUTRIN SR) 150 MG 12 hr tablet   Oral   Take 150 mg by mouth 2 (two) times daily.          . calcium-vitamin D (OSCAL WITH D) 500-200 MG-UNIT per tablet   Oral   Take 1 tablet by mouth 3 (three) times daily.           Marland Kitchen dicyclomine (BENTYL) 10 MG capsule   Oral   Take 1 capsule (10 mg total) by mouth 4 (four) times daily -  before meals and at bedtime.   120 capsule   3   . esomeprazole (NEXIUM) 40 MG capsule   Oral   Take 1 capsule (40 mg total) by mouth 2 (two) times daily.   60 capsule   5   . hydrOXYzine (ATARAX) 50 MG tablet   Oral   Take 50 mg by mouth  daily.           Marland Kitchen lamoTRIgine (LAMICTAL) 100 MG tablet   Oral   Take 100 mg by mouth 2 (two) times daily.          Marland Kitchen lisinopril (PRINIVIL,ZESTRIL) 5 MG tablet   Oral   Take 5 mg by mouth daily.           . mirtazapine (REMERON) 30 MG tablet   Oral   Take 30 mg by mouth at bedtime.           . nabumetone (RELAFEN) 500 MG tablet   Oral   Take 500 mg by mouth 2 (two) times daily.         . ondansetron (ZOFRAN) 4 MG tablet   Oral   Take 1 tablet (4 mg total) by mouth every 8 (eight) hours as needed for nausea.   20 tablet   0   . paliperidone (INVEGA) 9 MG 24 hr tablet   Oral   Take 9 mg by mouth 2 (two) times daily.          . potassium chloride (K-DUR) 10 MEQ tablet      1 tablet 2 (two) times daily.         . Probiotic Product (ALIGN) 4 MG CAPS   Oral   Take 4 mg by mouth daily.         . sertraline (ZOLOFT) 100 MG tablet   Oral   Take 100 mg by mouth 2 (two) times daily.          Marland Kitchen torsemide (DEMADEX) 20 MG tablet   Oral   Take 20 mg by mouth daily.         . traZODone (DESYREL) 100 MG tablet   Oral   Take 100 mg by mouth at bedtime.          . Vortioxetine HBr (BRINTELLIX) 10 MG TABS   Oral   Take 10 mg by mouth 3 (three) times daily.           BP 129/72  Pulse 84  Temp(Src) 99 F (37.2 C) (Oral)  Resp 16  Ht 5\' 10"  (1.778 m)  Wt 235 lb (106.595 kg)  BMI 33.72 kg/m2  SpO2 98% Physical Exam  Nursing note and vitals reviewed. Constitutional: She is oriented  to person, place, and time. She appears well-developed.  Obese, appears older than stated age  HENT:  Head: Normocephalic and atraumatic.  Eyes: Conjunctivae and EOM are normal. Pupils are equal, round, and reactive to light.  Neck: Normal range of motion and phonation normal. Neck supple.  Cardiovascular: Normal rate, regular rhythm and intact distal pulses.   Pulmonary/Chest: Effort normal and breath sounds normal. She exhibits no tenderness.  Abdominal: Soft. She  exhibits no distension and no mass. There is tenderness (Diffuse, mild). There is no rebound and no guarding.  Decreased bowel sounds  Genitourinary:  Normal anus. Small amount of firm, brown stool in the rectum. Hemoccult is negative. No pedal rectal mass. No hemorrhoids.  Musculoskeletal: Normal range of motion.  Normal range of motion, arms, and Iegs.  Neurological: She is alert and oriented to person, place, and time. She has normal strength. She exhibits normal muscle tone.  Skin: Skin is warm and dry.  Psychiatric: She has a normal mood and affect. Her behavior is normal. Judgment and thought content normal.    ED Course   Procedures (including critical care time)  Medications  0.9 %  sodium chloride infusion ( Intravenous New Bag/Given 02/24/13 1508)  potassium chloride SA (K-DUR,KLOR-CON) CR tablet 40 mEq (not administered)  sodium chloride 0.9 % bolus 1,000 mL (1,000 mL Intravenous New Bag/Given 02/24/13 1508)  HYDROmorphone (DILAUDID) injection 1 mg (1 mg Intravenous Given 02/24/13 1508)  ondansetron (ZOFRAN) injection 4 mg (4 mg Intravenous Given 02/24/13 1507)     Patient Vitals for the past 24 hrs:  BP Temp Temp src Pulse Resp SpO2 Height Weight  02/24/13 1506 129/72 mmHg - - 84 16 98 % - -  02/24/13 1349 138/82 mmHg 99 F (37.2 C) Oral 86 20 98 % 5\' 10"  (1.778 m) 235 lb (106.595 kg)      3:53 PM Reevaluation with update and discussion. After initial assessment and treatment, an updated evaluation reveals patient is comfortable now. She admits to using laxatives, recently. She's been told not to do that, by her PCP. She has no further complaints.Mancel Bale L     Date: 02/24/13  Rate: 94  Rhythm: normal sinus rhythm  QRS Axis: normal  PR and QT Intervals: normal  ST/T Wave abnormalities: nonspecific T wave changes  PR and QRS Conduction Disutrbances:none  Narrative Interpretation:   Old EKG Reviewed: unchanged- 01/17/12   Labs Reviewed  COMPREHENSIVE  METABOLIC PANEL - Abnormal; Notable for the following:    Sodium 131 (*)    Potassium 2.8 (*)    Chloride 95 (*)    CO2 33 (*)    Glucose, Bld 113 (*)    Total Protein 8.8 (*)    GFR calc non Af Amer 77 (*)    GFR calc Af Amer 90 (*)    All other components within normal limits  LIPASE, BLOOD - Abnormal; Notable for the following:    Lipase 88 (*)    All other components within normal limits  URINALYSIS, ROUTINE W REFLEX MICROSCOPIC - Abnormal; Notable for the following:    Specific Gravity, Urine <1.005 (*)    All other components within normal limits  URINE CULTURE  CBC WITH DIFFERENTIAL  TROPONIN I  OCCULT BLOOD X 1 CARD TO LAB, STOOL  OCCULT BLOOD, POC DEVICE   Dg Abd Acute W/chest  02/24/2013   *RADIOLOGY REPORT*  Clinical Data: Chest pain, dizziness and nausea.  ACUTE ABDOMEN SERIES (ABDOMEN 2 VIEW & CHEST 1 VIEW)  Comparison: CT chest 01/02/2013 and plain films of the chest 10/24/2011.  Findings: Right middle lobe pulmonary nodule seen on CT scan is not well visualized on plain film of the chest.  Lungs appear clear. Heart size normal.  No pneumothorax or pleural fluid.  Two views of the abdomen show no free intraperitoneal air.  The bowel gas pattern is normal.  No abnormal abdominal calcification or focal bony abnormality is identified.  IMPRESSION: Negative exam.   Original Report Authenticated By: Holley Dexter, M.D.   1. Abdominal pain   2. Chest pain   3. Constipation   4. Hypokalemia     MDM  Constipation, with nonspecific chest pain. No evidence for bowel obstruction. Doubt infectious colitis. Doubt metabolic instability, serious bacterial infection or impending vascular collapse; the patient is stable for discharge.  Nursing Notes Reviewed/ Care Coordinated, and agree without changes. Applicable Imaging Reviewed.  Interpretation of Laboratory Data incorporated into ED treatment   Plan: Home Medications- magnesium citrate, Colace, other as usual; Home  Treatments and Observation- rest, increase fluids, and fiber in diet; return here if the recommended treatment, does not improve the symptoms; Recommended follow up- PCP, for checkup, in one week.      Flint Melter, MD 02/24/13 [phone]

## 2013-02-26 LAB — URINE CULTURE: Colony Count: NO GROWTH

## 2013-03-02 ENCOUNTER — Emergency Department (HOSPITAL_COMMUNITY)
Admission: EM | Admit: 2013-03-02 | Discharge: 2013-03-03 | Disposition: A | Discharge status: Home / Self Care | Payer: PRIVATE HEALTH INSURANCE | Attending: Emergency Medicine | Admitting: Emergency Medicine

## 2013-03-02 ENCOUNTER — Emergency Department (HOSPITAL_COMMUNITY): Payer: PRIVATE HEALTH INSURANCE

## 2013-03-02 ENCOUNTER — Encounter (HOSPITAL_COMMUNITY): Payer: Self-pay

## 2013-03-02 DIAGNOSIS — K5900 Constipation, unspecified: Secondary | ICD-10-CM | POA: Insufficient documentation

## 2013-03-02 DIAGNOSIS — R509 Fever, unspecified: Secondary | ICD-10-CM | POA: Insufficient documentation

## 2013-03-02 DIAGNOSIS — R109 Unspecified abdominal pain: Secondary | ICD-10-CM | POA: Insufficient documentation

## 2013-03-02 DIAGNOSIS — Z880 Allergy status to penicillin: Secondary | ICD-10-CM | POA: Insufficient documentation

## 2013-03-02 DIAGNOSIS — R51 Headache: Secondary | ICD-10-CM | POA: Insufficient documentation

## 2013-03-02 DIAGNOSIS — Z79899 Other long term (current) drug therapy: Secondary | ICD-10-CM | POA: Insufficient documentation

## 2013-03-02 DIAGNOSIS — Z90710 Acquired absence of both cervix and uterus: Secondary | ICD-10-CM | POA: Insufficient documentation

## 2013-03-02 DIAGNOSIS — R197 Diarrhea, unspecified: Secondary | ICD-10-CM | POA: Insufficient documentation

## 2013-03-02 DIAGNOSIS — F17200 Nicotine dependence, unspecified, uncomplicated: Secondary | ICD-10-CM | POA: Insufficient documentation

## 2013-03-02 DIAGNOSIS — I252 Old myocardial infarction: Secondary | ICD-10-CM | POA: Insufficient documentation

## 2013-03-02 DIAGNOSIS — F411 Generalized anxiety disorder: Secondary | ICD-10-CM | POA: Insufficient documentation

## 2013-03-02 DIAGNOSIS — E119 Type 2 diabetes mellitus without complications: Secondary | ICD-10-CM | POA: Insufficient documentation

## 2013-03-02 DIAGNOSIS — E871 Hypo-osmolality and hyponatremia: Secondary | ICD-10-CM | POA: Insufficient documentation

## 2013-03-02 DIAGNOSIS — R5381 Other malaise: Secondary | ICD-10-CM | POA: Insufficient documentation

## 2013-03-02 DIAGNOSIS — Z9889 Other specified postprocedural states: Secondary | ICD-10-CM | POA: Insufficient documentation

## 2013-03-02 DIAGNOSIS — R112 Nausea with vomiting, unspecified: Secondary | ICD-10-CM | POA: Insufficient documentation

## 2013-03-02 DIAGNOSIS — Z8659 Personal history of other mental and behavioral disorders: Secondary | ICD-10-CM | POA: Insufficient documentation

## 2013-03-02 DIAGNOSIS — R630 Anorexia: Secondary | ICD-10-CM | POA: Insufficient documentation

## 2013-03-02 DIAGNOSIS — E876 Hypokalemia: Secondary | ICD-10-CM | POA: Insufficient documentation

## 2013-03-02 DIAGNOSIS — Z8719 Personal history of other diseases of the digestive system: Secondary | ICD-10-CM | POA: Insufficient documentation

## 2013-03-02 DIAGNOSIS — R0602 Shortness of breath: Secondary | ICD-10-CM | POA: Insufficient documentation

## 2013-03-02 DIAGNOSIS — R209 Unspecified disturbances of skin sensation: Secondary | ICD-10-CM | POA: Insufficient documentation

## 2013-03-02 DIAGNOSIS — I10 Essential (primary) hypertension: Secondary | ICD-10-CM | POA: Insufficient documentation

## 2013-03-02 DIAGNOSIS — G43909 Migraine, unspecified, not intractable, without status migrainosus: Secondary | ICD-10-CM | POA: Insufficient documentation

## 2013-03-02 LAB — CBC WITH DIFFERENTIAL/PLATELET
Eosinophils Absolute: 0 10*3/uL (ref 0.0–0.7)
Hemoglobin: 13.6 g/dL (ref 12.0–15.0)
Lymphocytes Relative: 56 % — ABNORMAL HIGH (ref 12–46)
Lymphs Abs: 2.3 10*3/uL (ref 0.7–4.0)
Monocytes Relative: 11 % (ref 3–12)
Neutro Abs: 1.3 10*3/uL — ABNORMAL LOW (ref 1.7–7.7)
Neutrophils Relative %: 32 % — ABNORMAL LOW (ref 43–77)
Platelets: 191 10*3/uL (ref 150–400)
RBC: 4.62 MIL/uL (ref 3.87–5.11)
WBC: 4 10*3/uL (ref 4.0–10.5)

## 2013-03-02 LAB — COMPREHENSIVE METABOLIC PANEL
ALT: 19 U/L (ref 0–35)
Alkaline Phosphatase: 79 U/L (ref 39–117)
BUN: 5 mg/dL — ABNORMAL LOW (ref 6–23)
CO2: 33 mEq/L — ABNORMAL HIGH (ref 19–32)
Chloride: 90 mEq/L — ABNORMAL LOW (ref 96–112)
GFR calc Af Amer: 87 mL/min — ABNORMAL LOW (ref >90)
Glucose, Bld: 89 mg/dL (ref 70–99)
Potassium: 2.9 mEq/L — ABNORMAL LOW (ref 3.5–5.1)
Sodium: 132 mEq/L — ABNORMAL LOW (ref 135–145)
Total Bilirubin: 0.3 mg/dL (ref 0.3–1.2)
Total Protein: 7.7 g/dL (ref 6.0–8.3)

## 2013-03-02 LAB — URINALYSIS, ROUTINE W REFLEX MICROSCOPIC
Bilirubin Urine: NEGATIVE
Hgb urine dipstick: NEGATIVE
Ketones, ur: NEGATIVE mg/dL
Specific Gravity, Urine: <1.005 — ABNORMAL LOW (ref 1.005–1.030)
Urobilinogen, UA: 0.2 mg/dL (ref 0.0–1.0)

## 2013-03-02 MED ORDER — ONDANSETRON HCL 4 MG/2ML IJ SOLN
4.0000 mg | Freq: Once | INTRAMUSCULAR | Status: AC
  Administered 2013-03-02: 4 mg via INTRAVENOUS
  Filled 2013-03-02: qty 2

## 2013-03-02 MED ORDER — POTASSIUM CHLORIDE CRYS ER 20 MEQ PO TBCR
40.0000 meq | EXTENDED_RELEASE_TABLET | Freq: Once | ORAL | Status: AC
  Administered 2013-03-02: 40 meq via ORAL
  Filled 2013-03-02: qty 2

## 2013-03-02 MED ORDER — ONDANSETRON 8 MG PO TBDP: 8.0000 mg | ORAL_TABLET | Freq: Three times a day (TID) | ORAL | Status: DC | PRN

## 2013-03-02 MED ORDER — IOHEXOL 300 MG/ML  SOLN
100.0000 mL | Freq: Once | INTRAMUSCULAR | Status: AC | PRN
  Administered 2013-03-02: 100 mL via INTRAVENOUS

## 2013-03-02 MED ORDER — SODIUM CHLORIDE 0.9 % IV BOLUS (SEPSIS)
500.0000 mL | Freq: Once | INTRAVENOUS | Status: AC
  Administered 2013-03-02: 18:00:00 via INTRAVENOUS

## 2013-03-02 MED ORDER — IOHEXOL 300 MG/ML  SOLN
50.0000 mL | Freq: Once | INTRAMUSCULAR | Status: AC | PRN
  Administered 2013-03-02: 50 mL via ORAL

## 2013-03-02 MED ORDER — POTASSIUM CHLORIDE 10 MEQ/100ML IV SOLN
10.0000 meq | Freq: Once | INTRAVENOUS | Status: AC
  Administered 2013-03-02: 10 meq via INTRAVENOUS
  Filled 2013-03-02: qty 100

## 2013-03-02 MED ORDER — POTASSIUM CHLORIDE CRYS ER 20 MEQ PO TBCR: 20.0000 meq | EXTENDED_RELEASE_TABLET | Freq: Two times a day (BID) | ORAL | Status: DC

## 2013-03-02 NOTE — ED Notes (Signed)
Pt resting calmly w/ eyes closed. Rise & fall of the chest noted. Bed in low position, side rails up x2. NAD noted at this time.  

## 2013-03-02 NOTE — ED Notes (Signed)
Pt c/o n/v/d for the past three weeks,. Noticed dark stools this am.

## 2013-03-02 NOTE — ED Notes (Signed)
Pt arrived from home by ems, pt reports vomiting and diarrhea since yesterday --black in color.

## 2013-03-02 NOTE — ED Provider Notes (Signed)
CSN: [id number]     Arrival date & time 03/02/13  1701 History  This chart was scribed for [employer]. Rubin Payor, MD, by Yevette Edwards, ED Scribe. This patient was seen in room APA03/APA03 and the patient's care was started at 5:33 PM.   First MD Initiated Contact with Patient 03/02/13 1715     Chief Complaint  Patient presents with  . Emesis  . Diarrhea    The history is provided by the patient. No language interpreter was used.   HPI Comments: [name] is a 52 y.o. female, with a h/o IBS, who presents to the Emergency Department complaining of intermittent emesis which began yesterday. The pt also reports that she has experienced a fever, chills, headache, diarrhea, SOB, numbness, weakness, and a diminished appetite as associated symptoms. She describes both the emesis and the diarrhea as "black." The pt states she has had sick contacts recently, though she reports none had emesis. She was treated in the ED six days ago for nausea, emesis, and constipation. The pt has a h/o abdominal surgery. She is a current smoker, and she reports using alcohol occasionally.   Past Medical History  Diagnosis Date  . Mental retardation   . Hyperglycemia   . HTN (hypertension)   . Migraines   . Anxiety   . IBS (irritable bowel syndrome)   . Diabetes mellitus   . Lung nodules     right, followed by PCP, PET 11/2011  . MI (myocardial infarction)    Past Surgical History  Procedure Laterality Date  . Abdominal hysterectomy    . Foot surgery    . External ear surgery      bilateral  . Glaucoma surgery    . Colonoscopy  11/2007    hyperplastic polyps, prior hx of adenomas   . Small bowel capsule  10/2007    normal  . Esophagogastroduodenoscopy  08/2007    moderate sized hiatal hernia  . Colonoscopy  05/2010    incomplete due to poor prep, hyperplastic rectal polyp  . Esophagogastroduodenoscopy  05/2010    noncritical schatzki ring s/p 14F  . Colonoscopy  05/05/2002    Dimunitive polyps  in the rectum and left colon, cold    biopsied/removed.  Scattered few left-sided diverticula.  Regular colonic   mucosa appeared normal  . Esophagogastroduodenoscopy (egd) with esophageal dilation N/A 02/06/2013    Procedure: ESOPHAGOGASTRODUODENOSCOPY (EGD) WITH ESOPHAGEAL DILATION;  Surgeon: Corbin Ade, MD;  Location: [facility];  Service: Endoscopy;  Laterality: N/A;  2:45   Family History  Problem Relation Age of Onset  . Colon cancer      aunt  . Liver disease Neg Hx   . Inflammatory bowel disease Neg Hx   . Stroke Mother   . Heart attack Father   . Obesity    . COPD    . GER disease    . Diabetes type II    . Anxiety disorder    . Depression     History  Substance Use Topics  . Smoking status: Current Every Day Smoker -- 0.25 packs/day for 40 years    Types: Cigarettes  . Smokeless tobacco: Never Used  . Alcohol Use: No     Comment: social drinker   No OB history provided.  Review of Systems  Constitutional: Positive for fever and chills.  Respiratory: Positive for shortness of breath.   Gastrointestinal: Positive for nausea, vomiting, abdominal pain, diarrhea and constipation.  Neurological: Positive for weakness, numbness  and headaches.  Psychiatric/Behavioral: The patient is nervous/anxious.   All other systems reviewed and are negative.   Allergies  Thorazine; Acetaminophen; Aspirin; Aspirin-acetaminophen-caffeine; Nsaids; Penicillins; and Tomato  Home Medications   Current Outpatient Rx  Name  Route  Sig  Dispense  Refill  . ALPRAZolam (XANAX) 0.25 MG tablet   Oral   Take 0.25 mg by mouth 2 (two) times daily as needed. For anxiety         . buPROPion (WELLBUTRIN SR) 150 MG 12 hr tablet   Oral   Take 150 mg by mouth 2 (two) times daily.          . calcium-vitamin D (OSCAL WITH D) 500-200 MG-UNIT per tablet   Oral   Take 1 tablet by mouth 3 (three) times daily.           Marland Kitchen dicyclomine (BENTYL) 10 MG capsule   Oral   Take 1 capsule (10  mg total) by mouth 4 (four) times daily -  before meals and at bedtime.   120 capsule   3   . esomeprazole (NEXIUM) 40 MG capsule   Oral   Take 1 capsule (40 mg total) by mouth 2 (two) times daily.   60 capsule   5   . hydrOXYzine (ATARAX) 50 MG tablet   Oral   Take 50 mg by mouth daily.           Marland Kitchen lamoTRIgine (LAMICTAL) 100 MG tablet   Oral   Take 100 mg by mouth 2 (two) times daily.          Marland Kitchen lisinopril (PRINIVIL,ZESTRIL) 5 MG tablet   Oral   Take 5 mg by mouth daily.           . mirtazapine (REMERON) 30 MG tablet   Oral   Take 30 mg by mouth at bedtime.           . nabumetone (RELAFEN) 500 MG tablet   Oral   Take 500 mg by mouth 2 (two) times daily.         . paliperidone (INVEGA) 9 MG 24 hr tablet   Oral   Take 9 mg by mouth 2 (two) times daily.          . potassium chloride (K-DUR) 10 MEQ tablet      1 tablet 2 (two) times daily.         . Probiotic Product (ALIGN) 4 MG CAPS   Oral   Take 4 mg by mouth daily.         . sertraline (ZOLOFT) 100 MG tablet   Oral   Take 100 mg by mouth 2 (two) times daily.          Marland Kitchen torsemide (DEMADEX) 20 MG tablet   Oral   Take 20 mg by mouth daily.         . traZODone (DESYREL) 100 MG tablet   Oral   Take 100 mg by mouth at bedtime.          . Vortioxetine HBr (BRINTELLIX) 10 MG TABS   Oral   Take 10 mg by mouth 3 (three) times daily.          . ondansetron (ZOFRAN-ODT) 8 MG disintegrating tablet   Oral   Take 1 tablet (8 mg total) by mouth every 8 (eight) hours as needed for nausea.   10 tablet   0   . potassium chloride SA (K-DUR,KLOR-CON) 20 MEQ tablet   Oral  Take 1 tablet (20 mEq total) by mouth 2 (two) times daily.   6 tablet   0    Triage Vitals: BP 108/77  Pulse 97  Temp(Src) 98.1 F (36.7 C) (Oral)  Resp 20  Ht 5' 10.5" (1.791 m)  Wt 235 lb (106.595 kg)  BMI 33.23 kg/m2  SpO2 100%  Physical Exam  Nursing note and vitals reviewed. Constitutional: She is oriented  to person, place, and time. She appears well-developed and well-nourished. No distress.  Obese.  HENT:  Head: Normocephalic and atraumatic.  Eyes: EOM are normal.  Neck: Neck supple. No tracheal deviation present.  Cardiovascular: Normal rate.   Pulmonary/Chest: Effort normal. No respiratory distress.  Abdominal: There is tenderness.  Mildly diffusely tender.   Musculoskeletal: Normal range of motion.  Neurological: She is alert and oriented to person, place, and time.  Skin: Skin is warm and dry.  Psychiatric: She has a normal mood and affect. Her behavior is normal.    ED Course   DIAGNOSTIC STUDIES:   Oxygen Saturation is 100% on room air, normal by my interpretation.    COORDINATION OF CARE:  5:37 PM- Discussed treatment plan with patient, and the patient agreed to the plan.   7:28 PM- Rechecked pt. Pt was still having abdominal pain. Informed pt that she would have an abdominal CT performed.   Procedures (including critical care time)  Labs Reviewed  CBC WITH DIFFERENTIAL - Abnormal; Notable for the following:    Neutrophils Relative % 32 (*)    Neutro Abs 1.3 (*)    Lymphocytes Relative 56 (*)    All other components within normal limits  COMPREHENSIVE METABOLIC PANEL - Abnormal; Notable for the following:    Sodium 132 (*)    Potassium 2.9 (*)    Chloride 90 (*)    CO2 33 (*)    BUN 5 (*)    GFR calc non Af Amer 75 (*)    GFR calc Af Amer 87 (*)    All other components within normal limits  LIPASE, BLOOD - Abnormal; Notable for the following:    Lipase 65 (*)    All other components within normal limits  URINALYSIS, ROUTINE W REFLEX MICROSCOPIC - Abnormal; Notable for the following:    APPearance HAZY (*)    Specific Gravity, Urine <1.005 (*)    All other components within normal limits   Ct Abdomen Pelvis W Contrast  03/02/2013   *RADIOLOGY REPORT*  Clinical Data: Abdominal pain  CT ABDOMEN AND PELVIS WITH CONTRAST  Technique:  Multidetector CT imaging of  the abdomen and pelvis was performed following the standard protocol during bolus administration of intravenous contrast.  Contrast: 50mL OMNIPAQUE IOHEXOL 300 MG/ML  SOLN, OMNIPAQUE IOHEXOL 300 MG/ML  SOLN  Comparison: 10/10/2011; PET CT - 11/29/2011  Findings:  Normal hepatic contour.  There is a minimal amount of focal fatty infiltration adjacent to the fissure for the ligamentum teres.  No discrete hepatic lesions.  Normal appearance of the gallbladder given under distension.  No intra or extrahepatic biliary ductal dilatation.  No ascites.  There is symmetric enhancement and excretion of the bilateral kidneys.  No definite renal stones on the post contrast examination.  No discrete renal lesions.  No urinary obstruction or perinephric stranding.  Normal appearance of the bilateral adrenal glands, pancreas and spleen.  Ingested enteric contrast extends to the level of the distal sigmoid colon.  No evidence of enteric obstruction.  Moderate colonic stool burden.  Normal  appearance of the appendix.  No pneumoperitoneum, pneumatosis or portal venous gas.  Normal caliber of the abdominal aorta.  Scattered atherosclerotic plaque within the abdominal aorta.  The major branch vessels of the abdominal aorta appear patent on this non CTA examination.  No retroperitoneal, mesenteric, pelvic or inguinal lymphadenopathy.  Post hysterectomy.  No discrete adnexal lesion.  No free fluid in the pelvis.  Limited visualization of the lower thorax is negative for focal airspace opacity or pleural effusion.  Normal heart size.  No pericardial effusion.  No acute or aggressive osseous abnormalities.  IMPRESSION: No explanation for patient's abdominal pain.  Specifically, no evidence of enteric or urinary obstruction.  Normal appearance of the appendix.   Original Report Authenticated By: Tacey Ruiz, MD   1. Nausea vomiting and diarrhea   2. Hypokalemia   3. Hyponatremia     MDM  Patient with nausea vomiting diarrhea.  Recently seen for same. Has some hypokalemia and hyponatremia that are similar to previously. Lipase was minimally elevated but less than 2 times normal. It is decreased since last measurement. Had continued pain and CT was done which was reassuring. Patient's tolerate orals will be discharged home. She'll follow with her PCP  I personally performed the services described in this documentation, which was scribed in my presence. The recorded information has been reviewed and is accurate.     Juliet Rude. Rubin Payor, MD 03/03/13 [phone]

## 2013-03-03 ENCOUNTER — Telehealth: Payer: Self-pay | Admitting: Internal Medicine

## 2013-03-03 NOTE — Telephone Encounter (Signed)
[name] called me yesterday evening regarding the patient, [name].  Patient could not come to the telephone. Patient having vomiting bringing up black material and black tarry bowel movements. I recommended the patient get to the emergency room for further evaluation without delay.

## 2013-03-03 NOTE — ED Notes (Signed)
Pt called for her ride to pick her up.

## 2013-03-03 NOTE — Telephone Encounter (Signed)
Pts friend spoke with RMR yesterday- they were advised to go to ED. Per ED note, ct normal and pt was told to see PCP. Pt has ov for 03/20/13. No earlier appts available.  Darl Pikes, if you have any cancellations, please try to get pt in. If not she needs to see pcp or go back to ED. Thanks.

## 2013-03-03 NOTE — ED Notes (Signed)
Pt alert & oriented x4, stable gait. Patient  given discharge instructions, paperwork & prescription(s). Patient verbalized understanding. Pt left department w/ no further questions. 

## 2013-03-03 NOTE — Telephone Encounter (Signed)
Pt was seen in the ER yesterday and was told to come see [name] ASAP. She is vomiting black blood and passing black dark stools. Her friend Vernona Rieger) called to see if she could be seen sooner than 9/11 with [facility]. Please advise and call Jazminn at [phone] or Vernona Rieger at [phone]

## 2013-03-04 ENCOUNTER — Encounter: Payer: Self-pay | Admitting: Internal Medicine

## 2013-03-04 ENCOUNTER — Ambulatory Visit (INDEPENDENT_AMBULATORY_CARE_PROVIDER_SITE_OTHER): Payer: PRIVATE HEALTH INSURANCE | Admitting: Gastroenterology

## 2013-03-04 VITALS — BP 108/72 | HR 76 | Temp 97.0°F | Ht 68.0 in | Wt 225.8 lb

## 2013-03-04 DIAGNOSIS — E876 Hypokalemia: Secondary | ICD-10-CM | POA: Insufficient documentation

## 2013-03-04 DIAGNOSIS — K920 Hematemesis: Secondary | ICD-10-CM | POA: Insufficient documentation

## 2013-03-04 DIAGNOSIS — K5900 Constipation, unspecified: Secondary | ICD-10-CM

## 2013-03-04 DIAGNOSIS — K921 Melena: Secondary | ICD-10-CM

## 2013-03-04 DIAGNOSIS — R1013 Epigastric pain: Secondary | ICD-10-CM

## 2013-03-04 LAB — CBC WITH DIFFERENTIAL/PLATELET
Eosinophils Absolute: 0 10*3/uL (ref 0.0–0.7)
Eosinophils Relative: 1 % (ref 0–5)
Hemoglobin: 13.9 g/dL (ref 12.0–15.0)
Lymphs Abs: 2.7 10*3/uL (ref 0.7–4.0)
MCH: 29.3 pg (ref 26.0–34.0)
MCHC: 34.4 g/dL (ref 30.0–36.0)
MCV: 85.1 fL (ref 78.0–100.0)
Monocytes Relative: 9 % (ref 3–12)
Platelets: 236 10*3/uL (ref 150–400)
RBC: 4.75 MIL/uL (ref 3.87–5.11)

## 2013-03-04 LAB — BASIC METABOLIC PANEL
CO2: 32 mEq/L (ref 19–32)
Calcium: 10.1 mg/dL (ref 8.4–10.5)
Creat: 0.93 mg/dL (ref 0.50–1.10)
Glucose, Bld: 91 mg/dL (ref 70–99)

## 2013-03-04 MED ORDER — POLYETHYLENE GLYCOL 3350 17 GM/SCOOP PO POWD: 17.0000 g | Freq: Every day | ORAL | Status: DC

## 2013-03-04 NOTE — Patient Instructions (Addendum)
1. Please have your blood work done today. 2. Start Miralax one capful each day. 3. I will discuss with Dr. Jena Gauss and let you know the next step.

## 2013-03-04 NOTE — Telephone Encounter (Signed)
LMOM for pt's friend Vernona Rieger) and also spoke with patient that we have a cancellation today at 1130 with [facility] if she could come today since they had concerns and didn't want to wait for OV in Sept with [facility]. Patient agreed to come today.

## 2013-03-04 NOTE — Progress Notes (Signed)
Primary Care Physician: [name], MD  Primary Gastroenterologist:  Roetta Sessions, MD   Chief Complaint  Patient presents with  . Diarrhea  . Emesis    HPI: [name] is a 52 y.o. female here with complaints of diarrhea and vomiting. She presented to [facility] department on August 18 with complaints of abdominal pain. Stated she did not have a bowel movement for 3 weeks. Single episode of vomiting prior to evaluation. Rectal exam showed firm brown stool, heme-negative. She is in last take magnesium citrate, Colace. Patient tells me that she was told not to take any laxatives. She states she eventually does have a bowel movement. 2 days after her bowel movements started she noted black stool. She had multiple episodes of emesis, describes black coffee grounds. She called our office and was advised to go back to the emergency department for evaluation. She was evaluated on August 24. CT of the abdomen and pelvis showed moderate amount of stool burden. Hemoglobin in the 13 range. She had hyponatremia and hypokalemia. She was sent home. Five BMs last night. No BM today. Black stool with red trim. Soft to liquidy stool. Epigastric pain/central abdominal pain. Last episode of emesis yesterday.  No Pepto/Mylanta/Maalox.  Current Outpatient Prescriptions  Medication Sig Dispense Refill  . ALPRAZolam (XANAX) 0.25 MG tablet Take 0.25 mg by mouth 2 (two) times daily as needed. For anxiety      . buPROPion (WELLBUTRIN SR) 150 MG 12 hr tablet Take 150 mg by mouth 2 (two) times daily.       . calcium-vitamin D (OSCAL WITH D) 500-200 MG-UNIT per tablet Take 1 tablet by mouth 3 (three) times daily.        Marland Kitchen dicyclomine (BENTYL) 10 MG capsule Take 1 capsule (10 mg total) by mouth 4 (four) times daily -  before meals and at bedtime.  120 capsule  3  . esomeprazole (NEXIUM) 40 MG capsule Take 1 capsule (40 mg total) by mouth 2 (two) times daily.  60 capsule  5  . hydrOXYzine (ATARAX) 50 MG tablet Take 50 mg by  mouth daily.        Marland Kitchen lamoTRIgine (LAMICTAL) 100 MG tablet Take 100 mg by mouth 2 (two) times daily.       Marland Kitchen lisinopril (PRINIVIL,ZESTRIL) 5 MG tablet Take 5 mg by mouth daily.        . mirtazapine (REMERON) 30 MG tablet Take 30 mg by mouth at bedtime.        . nabumetone (RELAFEN) 500 MG tablet Take 500 mg by mouth 2 (two) times daily.      . ondansetron (ZOFRAN-ODT) 8 MG disintegrating tablet Take 1 tablet (8 mg total) by mouth every 8 (eight) hours as needed for nausea.  10 tablet  0  . paliperidone (INVEGA) 9 MG 24 hr tablet Take 9 mg by mouth 2 (two) times daily.       . potassium chloride (K-DUR) 10 MEQ tablet 1 tablet 2 (two) times daily.      . potassium chloride SA (K-DUR,KLOR-CON) 20 MEQ tablet Take 1 tablet (20 mEq total) by mouth 2 (two) times daily.  6 tablet  0  . Probiotic Product (ALIGN) 4 MG CAPS Take 4 mg by mouth daily.      . sertraline (ZOLOFT) 100 MG tablet Take 100 mg by mouth 2 (two) times daily.       Marland Kitchen torsemide (DEMADEX) 20 MG tablet Take 20 mg by mouth daily.      Marland Kitchen  traZODone (DESYREL) 100 MG tablet Take 100 mg by mouth at bedtime.       . Vortioxetine HBr (BRINTELLIX) 10 MG TABS Take 10 mg by mouth 3 (three) times daily.        No current facility-administered medications for this visit.    Allergies as of 03/04/2013 - Review Complete 03/04/2013  Allergen Reaction Noted  . Thorazine [chlorpromazine hcl] Anaphylaxis 10/20/2010  . Acetaminophen Other (See Comments)   . Aspirin Other (See Comments)   . Aspirin-acetaminophen-caffeine Other (See Comments)   . Nsaids Nausea And Vomiting   . Penicillins Nausea And Vomiting   . Tomato Rash 01/17/2012    ROS:  General: Negative for anorexia, weight loss, fever, chills, fatigue, weakness. ENT: Negative for hoarseness, difficulty swallowing , nasal congestion. CV: Negative for chest pain, angina, palpitations, dyspnea on exertion, peripheral edema.  Respiratory: Negative for dyspnea at rest, dyspnea on exertion,  cough, sputum, wheezing.  GI: See history of present illness. GU:  Negative for dysuria, hematuria, urinary incontinence, urinary frequency, nocturnal urination.  Endo: Negative for unusual weight change.    Physical Examination:   BP 108/72  Pulse 76  Temp(Src) 97 F (36.1 C) (Oral)  Ht 5\' 8"  (1.727 m)  Wt 225 lb 12.8 oz (102.422 kg)  BMI 34.34 kg/m2  General: Well-nourished, well-developed in no acute distress.  Eyes: No icterus. Mouth: Oropharyngeal mucosa moist and pink , no lesions erythema or exudate. Lungs: Clear to auscultation bilaterally.  Heart: Regular rate and rhythm, no murmurs rubs or gallops.  Abdomen: Bowel sounds are normal, nontender, nondistended, no hepatosplenomegaly or masses, no abdominal bruits or hernia , no rebound or guarding.   Extremities: No lower extremity edema. No clubbing or deformities. Rectal: light brown, formed stool, heme negative. No abnormalities noted. Neuro: Alert and oriented x 4   Skin: Warm and dry, no jaundice.   Psych: Alert and cooperative, normal mood and affect.  Labs:  Lab Results  Component Value Date   WBC 4.0 03/02/2013   HGB 13.6 03/02/2013   HCT 38.9 03/02/2013   MCV 84.2 03/02/2013   PLT 191 03/02/2013   Lab Results  Component Value Date   CREATININE 0.88 03/02/2013   BUN 5* 03/02/2013   NA 132* 03/02/2013   K 2.9* 03/02/2013   CL 90* 03/02/2013   CO2 33* 03/02/2013   Lab Results  Component Value Date   ALT 19 03/02/2013   AST 27 03/02/2013   ALKPHOS 79 03/02/2013   BILITOT 0.3 03/02/2013   Lab Results  Component Value Date   LIPASE 65* 03/02/2013   Heme negative 02/24/13.   Imaging Studies: Ct Abdomen Pelvis W Contrast  03/02/2013   *RADIOLOGY REPORT*  Clinical Data: Abdominal pain  CT ABDOMEN AND PELVIS WITH CONTRAST  Technique:  Multidetector CT imaging of the abdomen and pelvis was performed following the standard protocol during bolus administration of intravenous contrast.  Contrast: 50mL OMNIPAQUE IOHEXOL 300  MG/ML  SOLN, OMNIPAQUE IOHEXOL 300 MG/ML  SOLN  Comparison: 10/10/2011; PET CT - 11/29/2011  Findings:  Normal hepatic contour.  There is a minimal amount of focal fatty infiltration adjacent to the fissure for the ligamentum teres.  No discrete hepatic lesions.  Normal appearance of the gallbladder given under distension.  No intra or extrahepatic biliary ductal dilatation.  No ascites.  There is symmetric enhancement and excretion of the bilateral kidneys.  No definite renal stones on the post contrast examination.  No discrete renal lesions.  No urinary  obstruction or perinephric stranding.  Normal appearance of the bilateral adrenal glands, pancreas and spleen.  Ingested enteric contrast extends to the level of the distal sigmoid colon.  No evidence of enteric obstruction.  Moderate colonic stool burden.  Normal appearance of the appendix.  No pneumoperitoneum, pneumatosis or portal venous gas.  Normal caliber of the abdominal aorta.  Scattered atherosclerotic plaque within the abdominal aorta.  The major branch vessels of the abdominal aorta appear patent on this non CTA examination.  No retroperitoneal, mesenteric, pelvic or inguinal lymphadenopathy.  Post hysterectomy.  No discrete adnexal lesion.  No free fluid in the pelvis.  Limited visualization of the lower thorax is negative for focal airspace opacity or pleural effusion.  Normal heart size.  No pericardial effusion.  No acute or aggressive osseous abnormalities.  IMPRESSION: No explanation for patient's abdominal pain.  Specifically, no evidence of enteric or urinary obstruction.  Normal appearance of the appendix.   Original Report Authenticated By: Tacey Ruiz, MD   Dg Abd Acute W/chest  02/24/2013   *RADIOLOGY REPORT*  Clinical Data: Chest pain, dizziness and nausea.  ACUTE ABDOMEN SERIES (ABDOMEN 2 VIEW & CHEST 1 VIEW)  Comparison: CT chest 01/02/2013 and plain films of the chest 10/24/2011.  Findings: Right middle lobe pulmonary nodule  seen on CT scan is not well visualized on plain film of the chest.  Lungs appear clear. Heart size normal.  No pneumothorax or pleural fluid.  Two views of the abdomen show no free intraperitoneal air.  The bowel gas pattern is normal.  No abnormal abdominal calcification or focal bony abnormality is identified.  IMPRESSION: Negative exam.   Original Report Authenticated By: Holley Dexter, M.D.

## 2013-03-05 NOTE — Progress Notes (Signed)
CC'd to PCP 

## 2013-03-05 NOTE — Assessment & Plan Note (Addendum)
52 year old lady presents with complaints of melena, coffee ground emesis, central abdominal pain. She had EGD on July 31 for dysphagia and epigastric pain. She had a normal esophagus but she was dilated up to 76 [place] without any evidence of trauma. She had a hiatal hernia. Otherwise unremarkable exam. 4 weeks from procedure, very unlikely to procedural related bleeding. Cannot exclude proximal small bowel lesion but it is important to note that her hemoglobin has remained normal and she has been heme negative despite her history. She is a difficult historian given her mental illness and cognitive deficits.  Recheck CBC today. Given her hyponatremia and hypokalemia we will recheck that as well. She tells me she's not on any medication for constipation and cannot exclude spurious diarrhea at this point. She had solid stool on exam today and evidence of significant stool burden on recent CT scan. MiraLax 17 g daily. Further recommendations to follow.

## 2013-03-05 NOTE — Progress Notes (Signed)
Please let pt know her H/H, Potassium are normal. Discussed with Dr. Jena Gauss. Continue Miralax daily as needed for constipation.  OV in 4 weeks for follow-up. Call if recurrent black, tarry stools.

## 2013-03-06 NOTE — Progress Notes (Signed)
Pt is aware.  Susan, please schedule ov 

## 2013-03-06 NOTE — Progress Notes (Signed)
Unable to reach patient by phone and she doesn't have VM. I mailed her appt card for 9/29 at 10 with [facility] and to disregard the OV for 9/11 since we moved her appt up to sooner.

## 2013-03-07 ENCOUNTER — Encounter: Payer: Self-pay | Admitting: Family Medicine

## 2013-03-07 ENCOUNTER — Ambulatory Visit (INDEPENDENT_AMBULATORY_CARE_PROVIDER_SITE_OTHER): Payer: PRIVATE HEALTH INSURANCE | Admitting: Family Medicine

## 2013-03-07 VITALS — BP 108/80 | Temp 98.5°F | Ht 70.0 in | Wt 224.0 lb

## 2013-03-07 DIAGNOSIS — K920 Hematemesis: Secondary | ICD-10-CM

## 2013-03-07 DIAGNOSIS — R109 Unspecified abdominal pain: Secondary | ICD-10-CM

## 2013-03-07 DIAGNOSIS — R195 Other fecal abnormalities: Secondary | ICD-10-CM

## 2013-03-07 NOTE — Progress Notes (Signed)
  Subjective:    Patient ID: [name], female    DOB: 1961-01-23, 52 y.o.   MRN: [id number]  Emesis  This is a recurrent problem. The current episode started more than 1 month ago. The problem occurs more than 10 times per day. The problem has been gradually worsening. The emesis has an appearance of coffee grounds. Associated symptoms include abdominal pain, chest pain, diarrhea, dizziness, headaches and weight loss.   Went to the hospital for same symptoms on Sunday, was released on Tuesday and talked about possible surgery.  This patient gave a very circular history regarding what went on it required significant amount of time while with the patient to look at the most recent ER notes as well as the gastroenterology notes her labs recent procedures and rediscuss her symptoms with her. Patient still states that she has intermittent vomiting maybe not as much as she first represented and she is uncertain if there is really blood in her vomitus her fingerstick hemoglobin looks good today. She has a history of stretching what is really going on. Past medical history family history social was reviewed patient was encouraged to quit smoking Review of Systems  Constitutional: Positive for weight loss.  Cardiovascular: Positive for chest pain.  Gastrointestinal: Positive for vomiting, abdominal pain and diarrhea.  Neurological: Positive for dizziness and headaches.       Objective:   Physical Exam Her lungs are clear hearts regular pulse normal abdomen soft extremities no edema skin warm dry.       Assessment & Plan:  Hematemesis/vomiting/abdominal pain-her hemoglobin is good it's hard to believe that she's losing much if any also her recent EGD looked fairly good I don't recommend any further testing currently instead we will see her back in a few weeks to see how she is doing I encourage her to bring all of her medicines with her visit so we can go overall of those. 25 minutes was spent  with this patient [phone]

## 2013-03-07 NOTE — Patient Instructions (Signed)
Bring all meds to next visit  OV here in 2 weeks  Do your labs

## 2013-03-20 ENCOUNTER — Ambulatory Visit: Payer: PRIVATE HEALTH INSURANCE | Admitting: Gastroenterology

## 2013-03-21 ENCOUNTER — Ambulatory Visit: Payer: Medicaid Other | Admitting: Family Medicine

## 2013-03-28 ENCOUNTER — Ambulatory Visit (INDEPENDENT_AMBULATORY_CARE_PROVIDER_SITE_OTHER): Payer: PRIVATE HEALTH INSURANCE | Admitting: Family Medicine

## 2013-03-28 ENCOUNTER — Encounter: Payer: Self-pay | Admitting: Family Medicine

## 2013-03-28 VITALS — BP 110/78 | Ht 70.5 in | Wt 228.0 lb

## 2013-03-28 DIAGNOSIS — R634 Abnormal weight loss: Secondary | ICD-10-CM

## 2013-03-28 DIAGNOSIS — F70 Mild intellectual disabilities: Secondary | ICD-10-CM

## 2013-03-28 DIAGNOSIS — R911 Solitary pulmonary nodule: Secondary | ICD-10-CM

## 2013-03-28 DIAGNOSIS — R1319 Other dysphagia: Secondary | ICD-10-CM

## 2013-03-28 NOTE — Progress Notes (Signed)
  Subjective:    Patient ID: [name], female    DOB: Sep 28, 1960, 52 y.o.   MRN: [id number]  HPIMigraine headache and dizziness. Meds not helping.  Patient relates intermittent headaches but when I asked her in detail what this entails she states that headaches at times and at times doesn't no vomiting with it no blurred vision.  Having jerks in arms and legs for a few months. She states at times her legs jerk. She recently had lab work done where this electrolytes look normal. There is no abnormal movements while I was in the exam room with her.  Trouble swallowing food and meds for a couple of months. Having throat pain. She has lost some weight. It is questionable if she eats more than once per day. This patient has mental health issues. She has a Educational psychologist who follows her. He tries to help assist her with her medications. Currently she is getting her medications from [facility]. There are bubble packing the medicine. That helps her be more compliant.  Went to [facility] ER for chest and stomach pain about 2 months ago. She had a CAT scan in the summer of the chest that showed a stable pulmonary nodule with a recommended repeat in one year plus also a CAT scan of the abdomen which was negative in addition to this had endoscopy with stretching in the esophagus.  Patient has extensive psychologic issues with quite a few somatic complaints and significant difficulties with relating what is really going on    Review of Systems See above.    Objective:   Physical Exam Neck no masses no gross no nodules lungs are clear no crackles heart is regular abdomen is soft no guarding rebound or tenderness skin warm dry       Assessment & Plan:  Weight loss-I think this is primarily nutritional I believe this patient would benefit from home health coming out in assessing her home environment checking on her medication compliance plus also considering a nutritional consultation. I do not  believe that this patient needs further testing currently we'll see her back in one month if still dramatic weight loss will reconsider rechecking some lab work including thyroid  I advised the family/patient to do a good job with her medications and followup in one month and bring all of her medicines with her.

## 2013-04-07 ENCOUNTER — Other Ambulatory Visit (HOSPITAL_COMMUNITY): Payer: Self-pay | Admitting: Internal Medicine

## 2013-04-07 ENCOUNTER — Encounter: Payer: Self-pay | Admitting: Gastroenterology

## 2013-04-07 ENCOUNTER — Ambulatory Visit (INDEPENDENT_AMBULATORY_CARE_PROVIDER_SITE_OTHER): Payer: PRIVATE HEALTH INSURANCE | Admitting: Gastroenterology

## 2013-04-07 VITALS — BP 99/63 | HR 76 | Temp 97.0°F | Ht 70.5 in | Wt 228.4 lb

## 2013-04-07 DIAGNOSIS — K5909 Other constipation: Secondary | ICD-10-CM

## 2013-04-07 DIAGNOSIS — K625 Hemorrhage of anus and rectum: Secondary | ICD-10-CM

## 2013-04-07 NOTE — Progress Notes (Signed)
Tried to call pt- NA 

## 2013-04-07 NOTE — Progress Notes (Signed)
Please schedule patient for a colonoscopy with Dr. Jena Gauss for history of rectal bleeding, tubular adenomas. Due to incomplete colonoscopy previously from poor bowel prep, would recommend modified prep.  1. 2 full days of clear liquids. 2. Began Dulcolax 10 mg orally daily 3 days prior to bowel prep.  Please request patient stopped taking Bentyl unless she has diarrhea (outside of time of bowel prep of course). We may need to [facility] as the patient has her medications provided via bubble packs.

## 2013-04-07 NOTE — Progress Notes (Signed)
Primary Care Physician: [name], MD  Primary Gastroenterologist:  Roetta Sessions, MD   Chief Complaint  Patient presents with  . Follow-up   HPI: [name] is a 52 y.o. female here for followup. I last saw her on 03/04/2013 with complaint of melena. See note for previous workup. For complaints of melena and coffee ground emesis we rechecked her hemoglobin which was again normal. She was started on MiraLax 17 g daily for constipation. Her weight is up 3 pounds since we last saw her. Although her weight was 239 pounds back in April. BMs okay on Miralax. Heartburn ok. No more melena, brbpr. Appetite not good. Occasional bright red blood per rectum. She wonders when her next colonoscopy is due.  Her last attempt at a colonoscopy was in 2011 but due to the poor prep it was incomplete. Her last complete colonoscopy was in 2009. She had hyperplastic polyps at that time. She does have a history of prior adenomas, 2003. Last EGD in July 2014, status post esophageal dilation. She also had a hiatal hernia.     Current Outpatient Prescriptions  Medication Sig Dispense Refill  . buPROPion (WELLBUTRIN SR) 150 MG 12 hr tablet Take 150 mg by mouth 2 (two) times daily.       . calcium-vitamin D (OSCAL WITH D) 500-200 MG-UNIT per tablet Take 1 tablet by mouth 3 (three) times daily.        Marland Kitchen dicyclomine (BENTYL) 10 MG capsule Take 1 capsule (10 mg total) by mouth 4 (four) times daily -  before meals and at bedtime.  120 capsule  3  . esomeprazole (NEXIUM) 40 MG capsule Take 1 capsule (40 mg total) by mouth 2 (two) times daily.  60 capsule  5  . hydrOXYzine (ATARAX) 50 MG tablet Take 50 mg by mouth daily.        Marland Kitchen lamoTRIgine (LAMICTAL) 100 MG tablet Take 100 mg by mouth 2 (two) times daily.       Marland Kitchen lisinopril (PRINIVIL,ZESTRIL) 5 MG tablet Take 5 mg by mouth daily.        . nabumetone (RELAFEN) 500 MG tablet Take 500 mg by mouth 2 (two) times daily.      . paliperidone (INVEGA) 9 MG 24 hr tablet Take 9  mg by mouth at bedtime.       . polyethylene glycol powder (GLYCOLAX/MIRALAX) powder Take 17 g by mouth daily.  527 g  3  . potassium chloride (K-DUR) 10 MEQ tablet 1 tablet 2 (two) times daily.      . Vortioxetine HBr (BRINTELLIX) 10 MG TABS Take 10 mg by mouth daily.       . sertraline (ZOLOFT) 100 MG tablet 100 mg daily.      Marland Kitchen torsemide (DEMADEX) 20 MG tablet 40 mg daily.      . traZODone (DESYREL) 100 MG tablet 100 mg daily.       No current facility-administered medications for this visit.     Allergies as of 04/07/2013 - Review Complete 04/07/2013  Allergen Reaction Noted  . Thorazine [chlorpromazine hcl] Anaphylaxis 10/20/2010  . Acetaminophen Other (See Comments)   . Aspirin Other (See Comments)   . Aspirin-acetaminophen-caffeine Other (See Comments)   . Nsaids Nausea And Vomiting   . Penicillins Nausea And Vomiting   . Tomato Rash 01/17/2012    Past Medical History  Diagnosis Date  . Mental retardation   . Hyperglycemia   . HTN (hypertension)   . Migraines   . Anxiety   .  IBS (irritable bowel syndrome)   . Diabetes mellitus   . Lung nodules     right, followed by PCP, PET 11/2011  . MI (myocardial infarction)    Past Surgical History  Procedure Laterality Date  . Abdominal hysterectomy    . Foot surgery    . External ear surgery      bilateral  . Glaucoma surgery    . Colonoscopy  11/2007    hyperplastic polyps, prior hx of adenomas   . Small bowel capsule  10/2007    normal  . Esophagogastroduodenoscopy  08/2007    moderate sized hiatal hernia  . Colonoscopy  05/2010    incomplete due to poor prep, hyperplastic rectal polyp  . Esophagogastroduodenoscopy  05/2010    noncritical schatzki ring s/p 91F  . Colonoscopy  05/05/2002    Dimunitive polyps in the rectum and left colon, cold    biopsied/removed.  Scattered few left-sided diverticula.  Regular colonic   mucosa appeared normal  . Esophagogastroduodenoscopy (egd) with esophageal dilation N/A 02/06/2013     ZOX:WRUEAV esophagus-s/p dilation up to a 58 [place] size with [facility] dilators.  Hiatal hernia   Family History  Problem Relation Age of Onset  . Colon cancer      aunt  . Liver disease Neg Hx   . Inflammatory bowel disease Neg Hx   . Stroke Mother   . Heart attack Father   . Obesity    . COPD    . GER disease    . Diabetes type II    . Anxiety disorder    . Depression     History   Social History  . Marital Status: Divorced    Spouse Name: N/A    Number of Children: N/A  . Years of Education: N/A   Occupational History  . disabled    Social History Main Topics  . Smoking status: Current Every Day Smoker -- 0.25 packs/day for 40 years    Types: Cigarettes  . Smokeless tobacco: Never Used  . Alcohol Use: No     Comment: social drinker  . Drug Use: No  . Sexual Activity: Yes    Partners: Male    Birth Control/ Protection: None     Comment: boyfriend   Other Topics Concern  . None   Social History Narrative  . None    ROS:  General: Negative for anorexia, weight loss, fever, chills, fatigue, weakness. ENT: Negative for hoarseness, difficulty swallowing , nasal congestion. CV: Negative for chest pain, angina, palpitations, dyspnea on exertion, peripheral edema.  Respiratory: Negative for dyspnea at rest, dyspnea on exertion, cough, sputum, wheezing.  GI: See history of present illness. GU:  Negative for dysuria, hematuria, urinary incontinence, urinary frequency, nocturnal urination.  Endo: Negative for unusual weight change.    Physical Examination:   BP 99/63  Pulse 76  Temp(Src) 97 F (36.1 C) (Oral)  Ht 5' 10.5" (1.791 m)  Wt 228 lb 6.4 oz (103.602 kg)  BMI 32.3 kg/m2  General: Well-nourished, well-developed in no acute distress.  Eyes: No icterus. Mouth: Oropharyngeal mucosa moist and pink , no lesions erythema or exudate. Lungs: Clear to auscultation bilaterally.  Heart: Regular rate and rhythm, no murmurs rubs or gallops.  Abdomen: Bowel  sounds are normal, nontender, nondistended, no hepatosplenomegaly or masses, no abdominal bruits or hernia , no rebound or guarding.   Extremities: No lower extremity edema. No clubbing or deformities. Neuro: Alert and oriented x 4   Skin: Warm and  dry, no jaundice.   Psych: Alert and cooperative, normal mood and affect.  Labs:  Lab Results  Component Value Date   WBC 4.8 03/04/2013   HGB 14.8 03/07/2013   HCT 40.4 03/04/2013   MCV 85.1 03/04/2013   PLT 236 03/04/2013    Imaging Studies: No results found.

## 2013-04-07 NOTE — Patient Instructions (Addendum)
1. We will determine when you are due for your next colonoscopy and let you know. 2. Please continue MiraLax one cap full daily. You may use up to twice daily if needed for constipation.

## 2013-04-07 NOTE — Assessment & Plan Note (Addendum)
52 year old lady with history of IBS, intermittent bright red blood per rectum, tubular adenomas in 2003. Predominantly constipated although in 01/2013 was having more diarrhea and was placed on Bentyl. At last OV c/o melena. Hemoglobin has been stable over multiple checks. Patient denies further melena. Constipation improved on MiraLax. Weight is up 3 pounds. At this time, question is whether to pursue surveillance/diagnostic colonoscopy given her last complete exam was in 2009. She had incomplete colonoscopy in 2011 due to poor prep. Patient is interested in colonoscopy and given that she does have a history of tubular adenomas, intermittent rectal bleeding, would advise colonoscopy at this time. Plan for modified bowel prep, 2 days. We will make arrangements.

## 2013-04-08 ENCOUNTER — Other Ambulatory Visit: Payer: Self-pay | Admitting: Internal Medicine

## 2013-04-08 DIAGNOSIS — K625 Hemorrhage of anus and rectum: Secondary | ICD-10-CM

## 2013-04-08 DIAGNOSIS — D369 Benign neoplasm, unspecified site: Secondary | ICD-10-CM

## 2013-04-08 MED ORDER — PEG 3350-KCL-NA BICARB-NACL 420 G PO SOLR: 4000.0000 mL | ORAL | Status: DC

## 2013-04-08 NOTE — Progress Notes (Signed)
Patient is scheduled for TCS w/RMR on Thurs Oct 9th at 10:00 and she is aware and she will pick up instructions from the office

## 2013-04-08 NOTE — Progress Notes (Signed)
CC'd to PCP 

## 2013-04-08 NOTE — Progress Notes (Signed)
Pt is aware and said it was ok to set up tcs. She said she will not take the bentyl unless she has diarrhea.

## 2013-04-17 ENCOUNTER — Ambulatory Visit (HOSPITAL_COMMUNITY)
Admission: RE | Admit: 2013-04-17 | Payer: PRIVATE HEALTH INSURANCE | Source: Ambulatory Visit | Admitting: Internal Medicine

## 2013-04-17 ENCOUNTER — Encounter (HOSPITAL_COMMUNITY): Admission: RE | Payer: Self-pay | Source: Ambulatory Visit

## 2013-04-17 DIAGNOSIS — E119 Type 2 diabetes mellitus without complications: Secondary | ICD-10-CM

## 2013-04-17 SURGERY — COLONOSCOPY
Anesthesia: Moderate Sedation

## 2013-04-18 ENCOUNTER — Telehealth: Payer: Self-pay | Admitting: Internal Medicine

## 2013-04-18 NOTE — Telephone Encounter (Signed)
Ms. Nugent case worker Ether Griffins called endo and spoke to [place] on 10/09 and stated that Ms. Mccarthy never picked up her prep to do the TCS, this has happened on several occasions and we would need to contact him to R/S her appointment. I LMOM for Mr. Yow to return my call to have her R/S before her 30 day H&P was up and he has not returned my call

## 2013-04-18 NOTE — Telephone Encounter (Signed)
[name] [phone] @ social services

## 2013-04-21 NOTE — Progress Notes (Signed)
REVIEWED.  

## 2013-04-28 ENCOUNTER — Ambulatory Visit (INDEPENDENT_AMBULATORY_CARE_PROVIDER_SITE_OTHER): Payer: PRIVATE HEALTH INSURANCE | Admitting: Family Medicine

## 2013-04-28 ENCOUNTER — Encounter: Payer: Self-pay | Admitting: Family Medicine

## 2013-04-28 ENCOUNTER — Telehealth: Payer: Self-pay

## 2013-04-28 ENCOUNTER — Other Ambulatory Visit: Payer: Self-pay | Admitting: Gastroenterology

## 2013-04-28 VITALS — BP 130/88 | Ht 70.0 in | Wt 228.8 lb

## 2013-04-28 DIAGNOSIS — E876 Hypokalemia: Secondary | ICD-10-CM

## 2013-04-28 DIAGNOSIS — K760 Fatty (change of) liver, not elsewhere classified: Secondary | ICD-10-CM

## 2013-04-28 DIAGNOSIS — F32A Depression, unspecified: Secondary | ICD-10-CM

## 2013-04-28 DIAGNOSIS — E042 Nontoxic multinodular goiter: Secondary | ICD-10-CM

## 2013-04-28 DIAGNOSIS — K7689 Other specified diseases of liver: Secondary | ICD-10-CM

## 2013-04-28 DIAGNOSIS — Z23 Encounter for immunization: Secondary | ICD-10-CM

## 2013-04-28 DIAGNOSIS — F329 Major depressive disorder, single episode, unspecified: Secondary | ICD-10-CM

## 2013-04-28 DIAGNOSIS — I10 Essential (primary) hypertension: Secondary | ICD-10-CM

## 2013-04-28 NOTE — Telephone Encounter (Signed)
T/C from Child psychotherapist, Ether Griffins. He said that the pt did not do her colonoscopy last week, because she did not get her prep. I spoke to Soledad Gerlach who is aware of this situation. Routing to her to reschedule.

## 2013-04-28 NOTE — Telephone Encounter (Signed)
I will let Dr. Jena Gauss make that call. Please ask him on Wednesday.

## 2013-04-28 NOTE — Progress Notes (Signed)
  Subjective:    Patient ID: [name], female    DOB: [birth year], 52 y.o.   MRN: [id number]  HPI Patient is here today for a f/u from 9/19 related to weight loss. Weight stable. Medications were reviewed in detail. She does have psychiatric health problems and apparently just did not get along with her other psychiatrist. She would like to try new psychiatrist  She does state she is trying eat relatively healthy but she does not do a good job describing how she is eating Past medical history reviewed family history reviewed Patient does smoke  Review of Systems Denies any chest tightness pressure pain shortness of breath. Denies rectal bleeding.    Objective:   Physical Exam Lungs clear heart regular abdomen soft extremities no edema foot exam was completed has calluses and some edema       Assessment & Plan:  #1 behavioral health issues-patient is on a boatload of medicines. I would like to see her get some reduction in her medications. She currently right now is having some difficulty with psychiatry she is seen she would like to establish with a new place. Unfortunately [facility] will not take her. So we will look into come behavioral health to see if they will be willing to see her #2 weight loss-actually stable currently she seems to be doing a better job of taking care of herself #3 pulmonary nodule stable repeat CT scan in June of next year #4 I would overall of her medicines one by one we will reduce hydroxyzine from 50 mg 2 each bedtime down to 1 each bedtime. 25 minutes spent with patient followup 3 months, her social service worker was with her today

## 2013-04-28 NOTE — Telephone Encounter (Signed)
Forwarding to RMR

## 2013-04-28 NOTE — Telephone Encounter (Signed)
Mr. Thurnell Lose cant get Ms. Forinash R/S unitl Nov and her 30 day H&P will have expired by then, can she be triaged over the phone to R/S, this is the 2nd time she has been R/S because of not doing her prep as directed.

## 2013-04-29 ENCOUNTER — Other Ambulatory Visit: Payer: Self-pay

## 2013-04-29 MED ORDER — HYDROXYZINE PAMOATE 50 MG PO CAPS: 50.0000 mg | ORAL_CAPSULE | Freq: Every day | ORAL | Status: DC

## 2013-04-29 NOTE — Telephone Encounter (Signed)
Got it; just re-schedule him; no need for a office visit again

## 2013-04-29 NOTE — Telephone Encounter (Signed)
Forwarding to [employer] for tirage

## 2013-05-02 LAB — LIPID PANEL
Cholesterol: 173 mg/dL (ref 0–200)
HDL: 56 mg/dL (ref >39)
LDL Cholesterol: 100 mg/dL — ABNORMAL HIGH (ref 0–99)
Total CHOL/HDL Ratio: 3.1 Ratio
Triglycerides: 86 mg/dL (ref <150)
VLDL: 17 mg/dL (ref 0–40)

## 2013-05-02 LAB — BASIC METABOLIC PANEL
BUN: 5 mg/dL — ABNORMAL LOW (ref 6–23)
CO2: 31 mEq/L (ref 19–32)
Calcium: 9 mg/dL (ref 8.4–10.5)
Creat: 0.84 mg/dL (ref 0.50–1.10)
Glucose, Bld: 75 mg/dL (ref 70–99)
Sodium: 135 mEq/L (ref 135–145)

## 2013-05-02 LAB — HEPATIC FUNCTION PANEL
ALT: 14 U/L (ref 0–35)
AST: 19 U/L (ref 0–37)
Alkaline Phosphatase: 91 U/L (ref 39–117)
Bilirubin, Direct: 0.1 mg/dL (ref 0.0–0.3)
Indirect Bilirubin: 0.2 mg/dL (ref 0.0–0.9)
Total Bilirubin: 0.3 mg/dL (ref 0.3–1.2)

## 2013-05-05 ENCOUNTER — Ambulatory Visit: Payer: Medicaid Other | Admitting: Family Medicine

## 2013-05-05 ENCOUNTER — Telehealth: Payer: Self-pay

## 2013-05-06 ENCOUNTER — Encounter: Payer: Self-pay | Admitting: Family Medicine

## 2013-05-08 ENCOUNTER — Other Ambulatory Visit: Payer: Self-pay | Admitting: Internal Medicine

## 2013-05-08 DIAGNOSIS — K625 Hemorrhage of anus and rectum: Secondary | ICD-10-CM

## 2013-05-08 DIAGNOSIS — K5900 Constipation, unspecified: Secondary | ICD-10-CM

## 2013-05-12 ENCOUNTER — Ambulatory Visit (HOSPITAL_COMMUNITY): Payer: Self-pay | Admitting: Psychiatry

## 2013-05-12 NOTE — Telephone Encounter (Signed)
Patient has prep and instructions and her social worker is also aware of the prep instructions

## 2013-05-12 NOTE — Telephone Encounter (Signed)
See separate triage.  

## 2013-05-12 NOTE — Telephone Encounter (Signed)
Gastroenterology Pre-Procedure Review  PER DR. Jena Gauss OK TO TRIAGE FOR COLONOSCOPY  Request Date: 05/05/2013 Requesting Physician: Dr. Jena Gauss  PATIENT REVIEW QUESTIONS: The patient responded to the following health history questions as indicated:    1. Diabetes Melitis: PRE-DIABETIC/ DIET CONTROLLED 2. Joint replacements in the past 12 months: no 3. Major health problems in the past 3 months: no 4. Has an artificial valve or MVP: no 5. Has a defibrillator: no 6. Has been advised in past to take antibiotics in advance of a procedure like teeth cleaning: no    MEDICATIONS & ALLERGIES:    Patient reports the following regarding taking any blood thinners:   Plavix? no Aspirin? no Coumadin? no  Patient confirms/reports the following medications:  Current Outpatient Prescriptions  Medication Sig Dispense Refill  . buPROPion (WELLBUTRIN SR) 150 MG 12 hr tablet Take 150 mg by mouth 2 (two) times daily.       . calcium-vitamin D (OSCAL WITH D) 500-200 MG-UNIT per tablet Take 1 tablet by mouth 3 (three) times daily.        Marland Kitchen dicyclomine (BENTYL) 10 MG capsule TAKE 1 CAPSULE 4 TIMES DAILY BEFORE MEALS AND AT BEDTIME.  120 capsule  5  . esomeprazole (NEXIUM) 40 MG capsule Take 1 capsule (40 mg total) by mouth 2 (two) times daily.  60 capsule  5  . hydrOXYzine (VISTARIL) 50 MG capsule Take 1 capsule (50 mg total) by mouth at bedtime.  30 capsule  0  . lamoTRIgine (LAMICTAL) 100 MG tablet Take 100 mg by mouth 2 (two) times daily.       Marland Kitchen lisinopril (PRINIVIL,ZESTRIL) 5 MG tablet Take 5 mg by mouth daily.        . nabumetone (RELAFEN) 500 MG tablet Take 500 mg by mouth 2 (two) times daily.      . paliperidone (INVEGA) 9 MG 24 hr tablet Take 9 mg by mouth at bedtime.       . polyethylene glycol powder (GLYCOLAX/MIRALAX) powder Take 17 g by mouth daily.  527 g  3  . potassium chloride (K-DUR) 10 MEQ tablet 1 tablet 2 (two) times daily.      . sertraline (ZOLOFT) 100 MG tablet 100 mg daily.      Marland Kitchen  torsemide (DEMADEX) 20 MG tablet 40 mg daily.      . traZODone (DESYREL) 100 MG tablet 100 mg daily.      . Vortioxetine HBr (BRINTELLIX) 10 MG TABS Take 10 mg by mouth daily.       . mupirocin ointment (BACTROBAN) 2 % Apply 1 application topically 2 (two) times daily.      . polyethylene glycol-electrolytes (TRILYTE) 420 G solution Take 4,000 mLs by mouth as directed.  4000 mL  0   No current facility-administered medications for this visit.    Patient confirms/reports the following allergies:  Allergies  Allergen Reactions  . Thorazine [Chlorpromazine Hcl] Anaphylaxis  . Acetaminophen Other (See Comments)    Makes pt dizzy  . Aspirin Other (See Comments)    seizure  . Aspirin-Acetaminophen-Caffeine Other (See Comments)    seizure  . Nsaids Nausea And Vomiting  . Penicillins Nausea And Vomiting  . Tomato Rash    No orders of the defined types were placed in this encounter.    AUTHORIZATION INFORMATION Primary Insurance:   ID #:   Group #:  Pre-Cert / Auth required:  Pre-Cert / Auth #:   Secondary Insurance:   ID #:   Group #:  Pre-Cert / Berkley Harvey required: Pre-Cert / Auth #:   SCHEDULE INFORMATION: Procedure has been scheduled as follows:  Date: 05/28/2013            Time:  8:30 AM Location: [facility] Short Stay  This Gastroenterology Pre-Precedure Review Form is being routed to the following provider(s): R. Roetta Sessions, MD

## 2013-05-12 NOTE — Telephone Encounter (Signed)
Routing to Soledad Gerlach who mailed the prep.

## 2013-05-12 NOTE — Telephone Encounter (Signed)
OK. to schedule. Patient needs a good prep this time. Last colonoscopy 2011-poor prep. Hyperplastic polyp. Distant history of colonic adenoma.

## 2013-05-15 ENCOUNTER — Ambulatory Visit (INDEPENDENT_AMBULATORY_CARE_PROVIDER_SITE_OTHER): Payer: PRIVATE HEALTH INSURANCE | Admitting: Psychiatry

## 2013-05-15 ENCOUNTER — Encounter (HOSPITAL_COMMUNITY): Payer: Self-pay | Admitting: Psychiatry

## 2013-05-15 VITALS — BP 130/70 | Ht 68.0 in | Wt 223.0 lb

## 2013-05-15 DIAGNOSIS — F259 Schizoaffective disorder, unspecified: Secondary | ICD-10-CM

## 2013-05-15 MED ORDER — BUPROPION HCL ER (SR) 150 MG PO TB12: 150.0000 mg | ORAL_TABLET | Freq: Two times a day (BID) | ORAL | Status: DC

## 2013-05-15 MED ORDER — HYDROXYZINE PAMOATE 50 MG PO CAPS: 50.0000 mg | ORAL_CAPSULE | Freq: Every day | ORAL | Status: DC

## 2013-05-15 MED ORDER — LAMOTRIGINE 100 MG PO TABS: 100.0000 mg | ORAL_TABLET | Freq: Two times a day (BID) | ORAL | Status: DC

## 2013-05-15 MED ORDER — VORTIOXETINE HBR 10 MG PO TABS: 10.0000 mg | ORAL_TABLET | Freq: Every day | ORAL | Status: DC

## 2013-05-15 MED ORDER — SERTRALINE HCL 50 MG PO TABS: ORAL_TABLET | ORAL | Status: DC

## 2013-05-15 MED ORDER — PALIPERIDONE ER 9 MG PO TB24: 9.0000 mg | ORAL_TABLET | Freq: Every day | ORAL | Status: DC

## 2013-05-15 MED ORDER — TRAZODONE HCL 100 MG PO TABS: 100.0000 mg | ORAL_TABLET | Freq: Every day | ORAL | Status: DC

## 2013-05-15 NOTE — Patient Instructions (Signed)
Take Zoloft 50 mg for 2 weeks then d/c
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
## 2013-05-20 ENCOUNTER — Encounter (HOSPITAL_COMMUNITY): Payer: Self-pay | Admitting: Pharmacy Technician

## 2013-05-28 ENCOUNTER — Encounter (HOSPITAL_COMMUNITY): Payer: Self-pay | Admitting: *Deleted

## 2013-05-28 ENCOUNTER — Ambulatory Visit (HOSPITAL_COMMUNITY)
Admission: RE | Admit: 2013-05-28 | Discharge: 2013-05-28 | Disposition: A | Discharge status: Home Health | Payer: PRIVATE HEALTH INSURANCE | Source: Ambulatory Visit | Attending: Internal Medicine | Admitting: Internal Medicine

## 2013-05-28 ENCOUNTER — Encounter (HOSPITAL_COMMUNITY)
Admission: RE | Disposition: A | Discharge status: Home Health | Payer: Self-pay | Source: Ambulatory Visit | Attending: Internal Medicine

## 2013-05-28 DIAGNOSIS — D126 Benign neoplasm of colon, unspecified: Secondary | ICD-10-CM | POA: Insufficient documentation

## 2013-05-28 DIAGNOSIS — Z01812 Encounter for preprocedural laboratory examination: Secondary | ICD-10-CM | POA: Insufficient documentation

## 2013-05-28 DIAGNOSIS — I10 Essential (primary) hypertension: Secondary | ICD-10-CM | POA: Insufficient documentation

## 2013-05-28 DIAGNOSIS — D128 Benign neoplasm of rectum: Secondary | ICD-10-CM

## 2013-05-28 DIAGNOSIS — K625 Hemorrhage of anus and rectum: Secondary | ICD-10-CM

## 2013-05-28 DIAGNOSIS — D129 Benign neoplasm of anus and anal canal: Secondary | ICD-10-CM

## 2013-05-28 DIAGNOSIS — K620 Anal polyp: Secondary | ICD-10-CM

## 2013-05-28 DIAGNOSIS — K5900 Constipation, unspecified: Secondary | ICD-10-CM

## 2013-05-28 DIAGNOSIS — E119 Type 2 diabetes mellitus without complications: Secondary | ICD-10-CM | POA: Insufficient documentation

## 2013-05-28 DIAGNOSIS — K921 Melena: Secondary | ICD-10-CM | POA: Insufficient documentation

## 2013-05-28 DIAGNOSIS — Z86010 Personal history of colonic polyps: Secondary | ICD-10-CM

## 2013-05-28 DIAGNOSIS — K621 Rectal polyp: Secondary | ICD-10-CM

## 2013-05-28 HISTORY — PX: COLONOSCOPY: SHX5424

## 2013-05-28 LAB — GLUCOSE, CAPILLARY: Glucose-Capillary: 94 mg/dL (ref 70–99)

## 2013-05-28 SURGERY — COLONOSCOPY
Anesthesia: Moderate Sedation

## 2013-05-28 MED ORDER — MEPERIDINE HCL 100 MG/ML IJ SOLN
INTRAMUSCULAR | Status: DC | PRN
  Administered 2013-05-28: 50 mg via INTRAVENOUS

## 2013-05-28 MED ORDER — ONDANSETRON HCL 4 MG/2ML IJ SOLN
INTRAMUSCULAR | Status: AC
  Filled 2013-05-28: qty 2

## 2013-05-28 MED ORDER — MIDAZOLAM HCL 5 MG/5ML IJ SOLN
INTRAMUSCULAR | Status: AC
  Filled 2013-05-28: qty 10

## 2013-05-28 MED ORDER — MEPERIDINE HCL 100 MG/ML IJ SOLN
INTRAMUSCULAR | Status: AC
  Filled 2013-05-28: qty 2

## 2013-05-28 MED ORDER — ONDANSETRON HCL 4 MG/2ML IJ SOLN
INTRAMUSCULAR | Status: DC | PRN
  Administered 2013-05-28: 4 mg via INTRAVENOUS

## 2013-05-28 MED ORDER — MIDAZOLAM HCL 5 MG/5ML IJ SOLN
INTRAMUSCULAR | Status: DC | PRN
  Administered 2013-05-28: 2 mg via INTRAVENOUS

## 2013-05-28 MED ORDER — SODIUM CHLORIDE 0.9 % IV SOLN
INTRAVENOUS | Status: DC
  Administered 2013-05-28: 08:00:00 via INTRAVENOUS

## 2013-05-28 MED ORDER — STERILE WATER FOR IRRIGATION IR SOLN
Status: DC | PRN
  Administered 2013-05-28: 09:00:00

## 2013-05-28 NOTE — Op Note (Signed)
[facility] [address] [place], [address]   COLONOSCOPY PROCEDURE REPORT  PATIENT: [name], [name]  MR#:         #[id number] BIRTHDATE: 28-Jul-1960 , 52  yrs. old GENDER: Female ENDOSCOPIST: R.  Roetta Sessions, MD FACP [facility] REFERRED BY:  Simone Curia, M.D.  [name], M.D. PROCEDURE DATE:  05/28/2013 PROCEDURE:     Colonoscopy with snare polypectomy and polyp ablation  INDICATIONS: History of colonic adenoma; paper hematochezia  INFORMED CONSENT:  The risks, benefits, alternatives and imponderables including but not limited to bleeding, perforation as well as the possibility of a missed lesion have been reviewed.  The potential for biopsy, lesion removal, etc. have also been discussed.  Questions have been answered.  All parties agreeable. Please see the history and physical in the medical record for more information.  MEDICATIONS: Versed 3 mg IV and Demerol 50 mg IV in divided doses. Zofran 4 mg IV  DESCRIPTION OF PROCEDURE:  After a digital rectal exam was performed, the EC-[id number] ([id number])  colonoscope was advanced from the anus through the rectum and colon to the area of the cecum, ileocecal valve and appendiceal orifice.  The cecum was deeply intubated.  These structures were well-seen and photographed for the record.  From the level of the cecum and ileocecal valve, the scope was slowly and cautiously withdrawn.  The mucosal surfaces were carefully surveyed utilizing scope tip deflection to facilitate fold flattening as needed.  The scope was pulled down into the rectum where a thorough examination including retroflexion was performed.    FINDINGS:  Adequate preparation. Friable anal canal; multiple hyperplastic-appearing polyps  -  all less than 5 mm in size at 10 cm from the anal verge and down at the anorectal junction; otherwise, the remainder of the rectal mucosa appeared normal. Somewhat redundant colon. The patient had a 6 mm polyp at  the hepatic flexure and 5 mm polyp at the splenic flexure. The remainder of the colonic mucosa appeared normal.  THERAPEUTIC / DIAGNOSTIC MANEUVERS PERFORMED:  The hepatic and splenic flexure polyps were hot and cold snared removed, respectively. (2)  4 mm hyperplastic polyps at 10 cm in from the anal verge were cold snare removed. The diminutive rectosigmoid polyps were ablated with the tip of a hot snare loop.  COMPLICATIONS: None  CECAL WITHDRAWAL TIME:  15 minutes  IMPRESSION:    Rectal and colonic polyps removed/treated as described above. I suspect trivial rectal bleeding benign anorectal origin in the setting of constipation.  RECOMMENDATIONS: Followup on pathology. Continue to manage constipation as previously recommended.   _______________________________ eSigned:  R. Roetta Sessions, MD FACP [facility] 05/28/2013 9:32 AM   CC:

## 2013-05-28 NOTE — H&P (Signed)
Primary Care Physician:  [name], MD Primary Gastroenterologist:  Dr. Jena Gauss  Pre-Procedure History & Physical: HPI:  [name] is a 52 y.o. female here for diagnostic colonoscopy. History of colonic adenoma. Poor prep 2011. Intermittent hematochezia in the setting of constipation. Seen earlier this year. Patient had to be rescheduled because she did not get her prep as instructed.  Past Medical History  Diagnosis Date  . Mental retardation   . Hyperglycemia   . HTN (hypertension)   . Migraines   . Anxiety   . IBS (irritable bowel syndrome)   . Diabetes mellitus   . Lung nodules     right, followed by PCP, PET 11/2011  . MI (myocardial infarction)   . Diabetes mellitus, type II     Past Surgical History  Procedure Laterality Date  . Abdominal hysterectomy    . Foot surgery    . External ear surgery      bilateral  . Glaucoma surgery    . Colonoscopy  11/2007    hyperplastic polyps, prior hx of adenomas   . Small bowel capsule  10/2007    normal  . Esophagogastroduodenoscopy  08/2007    moderate sized hiatal hernia  . Colonoscopy  05/2010    incomplete due to poor prep, hyperplastic rectal polyp  . Esophagogastroduodenoscopy  05/2010    noncritical schatzki ring s/p 45F  . Colonoscopy  05/05/2002    Dimunitive polyps in the rectum and left colon, cold    biopsied/removed.  Scattered few left-sided diverticula.  Regular colonic   mucosa appeared normal  . Esophagogastroduodenoscopy (egd) with esophageal dilation N/A 02/06/2013    WUJ:[id number] esophagus-s/p dilation up to a 58 [place] size with [facility] dilators.  Hiatal hernia    Prior to Admission medications   Medication Sig Start Date End Date Taking? Authorizing Provider  buPROPion (WELLBUTRIN SR) 150 MG 12 hr tablet Take 1 tablet (150 mg total) by mouth 2 (two) times daily. 05/15/13  Yes Diannia Ruder, MD  calcium-vitamin D (OSCAL WITH D) 500-200 MG-UNIT per tablet Take 1 tablet by mouth 3 (three) times daily.     Yes  Historical Provider, MD  dicyclomine (BENTYL) 10 MG capsule TAKE 1 CAPSULE 4 TIMES DAILY BEFORE MEALS AND AT BEDTIME. 05/08/13  Yes Nira Retort, NP  esomeprazole (NEXIUM) 40 MG capsule Take 1 capsule (40 mg total) by mouth 2 (two) times daily. 02/19/13  Yes Tiffany Kocher, PA-C  hydrOXYzine (VISTARIL) 50 MG capsule Take 1 capsule (50 mg total) by mouth at bedtime. 05/15/13  Yes Diannia Ruder, MD  lamoTRIgine (LAMICTAL) 100 MG tablet Take 1 tablet (100 mg total) by mouth 2 (two) times daily. 05/15/13  Yes Diannia Ruder, MD  lisinopril (PRINIVIL,ZESTRIL) 5 MG tablet Take 5 mg by mouth daily.     Yes Historical Provider, MD  nabumetone (RELAFEN) 500 MG tablet Take 500 mg by mouth 2 (two) times daily.   Yes Historical Provider, MD  paliperidone (INVEGA) 9 MG 24 hr tablet Take 1 tablet (9 mg total) by mouth at bedtime. 05/15/13  Yes Diannia Ruder, MD  potassium chloride (K-DUR) 10 MEQ tablet 1 tablet 2 (two) times daily. 10/01/12  Yes Historical Provider, MD  sertraline (ZOLOFT) 50 MG tablet Take one daily for 2 weeks then d/c 05/15/13  Yes Diannia Ruder, MD  torsemide (DEMADEX) 20 MG tablet 40 mg daily. 03/13/13  Yes Historical Provider, MD  traZODone (DESYREL) 100 MG tablet Take 1 tablet (100 mg total) by mouth daily. 05/15/13  Yes Diannia Ruder, MD  Vortioxetine HBr (BRINTELLIX) 10 MG TABS Take 1 tablet (10 mg total) by mouth daily. 05/15/13  Yes Diannia Ruder, MD  polyethylene glycol powder (GLYCOLAX/MIRALAX) powder Take 17 g by mouth daily. 03/04/13   Tiffany Kocher, PA-C    Allergies as of 05/08/2013 - Review Complete 05/05/2013  Allergen Reaction Noted  . Thorazine [chlorpromazine hcl] Anaphylaxis 10/20/2010  . Acetaminophen Other (See Comments)   . Aspirin Other (See Comments)   . Aspirin-acetaminophen-caffeine Other (See Comments)   . Nsaids Nausea And Vomiting   . Penicillins Nausea And Vomiting   . Tomato Rash 01/17/2012    Family History  Problem Relation Age of Onset  . Colon cancer      aunt   . Liver disease Neg Hx   . Inflammatory bowel disease Neg Hx   . Stroke Mother   . Heart attack Father   . Obesity    . COPD    . GER disease    . Diabetes type II    . Anxiety disorder    . Depression    . Depression Sister   . Schizophrenia Sister   . Schizophrenia Other   . Drug abuse Other   . Alcohol abuse Other     History   Social History  . Marital Status: Divorced    Spouse Name: N/A    Number of Children: N/A  . Years of Education: N/A   Occupational History  . disabled    Social History Main Topics  . Smoking status: Current Every Day Smoker -- 0.25 packs/day for 40 years    Types: Cigarettes  . Smokeless tobacco: Never Used  . Alcohol Use: No     Comment: social drinker  . Drug Use: No  . Sexual Activity: Yes    Partners: Male    Birth Control/ Protection: None     Comment: boyfriend   Other Topics Concern  . Not on file   Social History Narrative  . No narrative on file    Review of Systems: See HPI, otherwise negative ROS  Physical Exam: BP 119/78  Pulse 81  Temp(Src) 98.6 F (37 C) (Oral)  Resp 16  Ht 5\' 10"  (1.778 m)  Wt 223 lb (101.152 kg)  BMI 32.00 kg/m2  SpO2 94% General:   Alert,  Well-developed, well-nourished, pleasant and cooperative in NAD Skin:  Intact without significant lesions or rashes. Eyes:  Sclera clear, no icterus.   Conjunctiva pink. Ears:  Normal auditory acuity. Nose:  No deformity, discharge,  or lesions. Mouth:  No deformity or lesions. Neck:  Supple; no masses or thyromegaly. No significant cervical adenopathy. Lungs:  Clear throughout to auscultation.   No wheezes, crackles, or rhonchi. No acute distress. Heart:  Regular rate and rhythm; no murmurs, clicks, rubs,  or gallops. Abdomen: Obese. Non-distended, normal bowel sounds.  Soft and nontender without appreciable mass or hepatosplenomegaly.  Pulses:  Normal pulses noted. Extremities:  Without clubbing or edema.  Impression:   Pleasant 52 year old  lady with a history of colonic adenoma. Poor prep 2011 now with intermittent hematochezia in a setting of constipation-rectal bleeding improved with a better management of constipation via MiraLax. Colonoscopy today per plan.The risks, benefits, limitations, alternatives and imponderables have been reviewed with the patient. Questions have been answered. All parties are agreeable.

## 2013-05-28 NOTE — Discharge Summary (Signed)

## 2013-05-29 ENCOUNTER — Encounter: Payer: Self-pay | Admitting: Internal Medicine

## 2013-06-02 ENCOUNTER — Ambulatory Visit (INDEPENDENT_AMBULATORY_CARE_PROVIDER_SITE_OTHER): Payer: PRIVATE HEALTH INSURANCE | Admitting: Psychiatry

## 2013-06-02 DIAGNOSIS — F411 Generalized anxiety disorder: Secondary | ICD-10-CM

## 2013-06-02 DIAGNOSIS — F329 Major depressive disorder, single episode, unspecified: Secondary | ICD-10-CM

## 2013-06-02 DIAGNOSIS — F419 Anxiety disorder, unspecified: Secondary | ICD-10-CM

## 2013-06-03 ENCOUNTER — Encounter (HOSPITAL_COMMUNITY): Payer: Self-pay | Admitting: Internal Medicine
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
## 2013-06-03 NOTE — Patient Instructions (Signed)
Discussed orally 

## 2013-06-12 ENCOUNTER — Ambulatory Visit (INDEPENDENT_AMBULATORY_CARE_PROVIDER_SITE_OTHER): Payer: PRIVATE HEALTH INSURANCE | Admitting: Psychiatry

## 2013-06-12 ENCOUNTER — Encounter (HOSPITAL_COMMUNITY): Payer: Self-pay | Admitting: Psychiatry

## 2013-06-12 VITALS — BP 110/76 | Ht 70.0 in | Wt 217.0 lb

## 2013-06-12 DIAGNOSIS — F259 Schizoaffective disorder, unspecified: Secondary | ICD-10-CM

## 2013-06-12 DIAGNOSIS — F79 Unspecified intellectual disabilities: Secondary | ICD-10-CM

## 2013-06-12 DIAGNOSIS — F329 Major depressive disorder, single episode, unspecified: Secondary | ICD-10-CM

## 2013-06-12 NOTE — Patient Instructions (Signed)
Stop Trazodone
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
## 2013-06-23 ENCOUNTER — Ambulatory Visit (INDEPENDENT_AMBULATORY_CARE_PROVIDER_SITE_OTHER): Payer: PRIVATE HEALTH INSURANCE | Admitting: Psychiatry

## 2013-06-23 DIAGNOSIS — F419 Anxiety disorder, unspecified: Secondary | ICD-10-CM

## 2013-06-23 DIAGNOSIS — F329 Major depressive disorder, single episode, unspecified: Secondary | ICD-10-CM

## 2013-06-23 DIAGNOSIS — F411 Generalized anxiety disorder: Secondary | ICD-10-CM

## 2013-06-23 NOTE — Patient Instructions (Signed)
Discussed orally 
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
## 2013-07-08 ENCOUNTER — Other Ambulatory Visit: Payer: Self-pay | Admitting: Family Medicine

## 2013-07-08 ENCOUNTER — Other Ambulatory Visit (HOSPITAL_COMMUNITY): Payer: Self-pay | Admitting: Psychiatry

## 2013-07-14 ENCOUNTER — Ambulatory Visit (INDEPENDENT_AMBULATORY_CARE_PROVIDER_SITE_OTHER): Payer: PRIVATE HEALTH INSURANCE | Admitting: Psychiatry

## 2013-07-14 ENCOUNTER — Encounter (HOSPITAL_COMMUNITY): Payer: Self-pay | Admitting: Psychiatry

## 2013-07-14 VITALS — BP 100/70 | Ht 70.0 in | Wt 219.0 lb

## 2013-07-14 DIAGNOSIS — F259 Schizoaffective disorder, unspecified: Secondary | ICD-10-CM

## 2013-07-14 MED ORDER — HYDROXYZINE PAMOATE 50 MG PO CAPS: 50.0000 mg | ORAL_CAPSULE | Freq: Every day | ORAL | Status: DC

## 2013-07-14 MED ORDER — LAMOTRIGINE 100 MG PO TABS: 100.0000 mg | ORAL_TABLET | Freq: Two times a day (BID) | ORAL | Status: DC

## 2013-07-14 MED ORDER — BUPROPION HCL ER (SR) 150 MG PO TB12: 150.0000 mg | ORAL_TABLET | Freq: Two times a day (BID) | ORAL | Status: DC

## 2013-07-14 MED ORDER — PALIPERIDONE ER 9 MG PO TB24: 9.0000 mg | ORAL_TABLET | Freq: Every day | ORAL | Status: DC

## 2013-07-14 MED ORDER — VORTIOXETINE HBR 10 MG PO TABS: 10.0000 mg | ORAL_TABLET | Freq: Every day | ORAL | Status: DC
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
## 2013-07-15 ENCOUNTER — Ambulatory Visit (HOSPITAL_COMMUNITY): Payer: Self-pay | Admitting: Psychiatry

## 2013-07-17 ENCOUNTER — Ambulatory Visit (HOSPITAL_COMMUNITY): Payer: Self-pay | Admitting: Psychiatry

## 2013-07-24 ENCOUNTER — Ambulatory Visit (INDEPENDENT_AMBULATORY_CARE_PROVIDER_SITE_OTHER): Payer: PRIVATE HEALTH INSURANCE | Admitting: Psychiatry

## 2013-07-24 DIAGNOSIS — F259 Schizoaffective disorder, unspecified: Secondary | ICD-10-CM

## 2013-07-24 DIAGNOSIS — F419 Anxiety disorder, unspecified: Secondary | ICD-10-CM

## 2013-07-24 DIAGNOSIS — F411 Generalized anxiety disorder: Secondary | ICD-10-CM

## 2013-07-24 NOTE — Patient Instructions (Signed)
Discussed orally 
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
## 2013-07-30 ENCOUNTER — Ambulatory Visit (INDEPENDENT_AMBULATORY_CARE_PROVIDER_SITE_OTHER): Payer: PRIVATE HEALTH INSURANCE | Admitting: Family Medicine

## 2013-07-30 ENCOUNTER — Encounter: Payer: Self-pay | Admitting: Family Medicine

## 2013-07-30 VITALS — BP 130/76 | Temp 98.9°F | Ht 68.0 in | Wt 220.0 lb

## 2013-07-30 DIAGNOSIS — I10 Essential (primary) hypertension: Secondary | ICD-10-CM

## 2013-07-30 DIAGNOSIS — K219 Gastro-esophageal reflux disease without esophagitis: Secondary | ICD-10-CM

## 2013-07-30 DIAGNOSIS — K589 Irritable bowel syndrome without diarrhea: Secondary | ICD-10-CM

## 2013-07-30 NOTE — Progress Notes (Signed)
   Subjective:    Patient ID: [name], female    DOB: [birth year], 53 y.o.   MRN: [id number]  HPIHere for a med check up.   Cough, Sore throat, ear pain, low grade fever for 4 days. She denies any discolored phlegm wheezing difficulty breathing. She denies shortness of breath.  She is trying to eat healthy 8 she is not exercising on a regular basis. States in her apartment most of the time she is going to mental health for counseling.  She will be due for a CT scan of the lungs in June   A systematic review of her medicines was done. She is taking Nexium twice daily this puts her at higher risk of hip fractures we will reduce it also dicyclomine is been used 4 times a day in my opinion once at night would be fine then may use it when necessary during the day.  Review of Systems  Constitutional: Negative for fever, activity change, appetite change and fatigue.  HENT: Positive for congestion and rhinorrhea. Negative for ear pain.   Eyes: Negative for discharge.  Respiratory: Positive for cough. Negative for shortness of breath and wheezing.   Cardiovascular: Negative for chest pain.  Endocrine: Negative for polydipsia and polyphagia.  Genitourinary: Negative for frequency.  Neurological: Negative for weakness.  Psychiatric/Behavioral: Negative for confusion.       Objective:   Physical Exam  Constitutional: She is oriented to person, place, and time. She appears well-developed and well-nourished.  HENT:  Head: Normocephalic.  Right Ear: External ear normal.  Left Ear: External ear normal.  Neck: No thyromegaly present.  Cardiovascular: Normal rate, regular rhythm, normal heart sounds and intact distal pulses.   No murmur heard. Pulmonary/Chest: Effort normal and breath sounds normal. No respiratory distress. She has no wheezes.  Abdominal: Soft. Bowel sounds are normal. She exhibits no distension and no mass. There is no tenderness.  Musculoskeletal: Normal range of motion.  She exhibits no edema and no tenderness.  Lymphadenopathy:    She has no cervical adenopathy.  Neurological: She is alert and oriented to person, place, and time. She exhibits normal muscle tone.  Skin: Skin is warm and dry.  Psychiatric: She has a normal mood and affect. Her behavior is normal.          Assessment & Plan:  #1 blood pressure good #2 reflux-reduce Nexium to once daily this will help reduce her risk of osteoporosis later in life #3 irritable bowel use dicyclomine only at nighttime avoid it during the day use it only on a when necessary basis #4 pulmonary nodule repeat scan in June #5 followup in May for a checkup and lab work.

## 2013-07-31 ENCOUNTER — Other Ambulatory Visit: Payer: Self-pay | Admitting: *Deleted

## 2013-07-31 MED ORDER — ESOMEPRAZOLE MAGNESIUM 40 MG PO CPDR: 40.0000 mg | DELAYED_RELEASE_CAPSULE | Freq: Every day | ORAL | Status: DC

## 2013-07-31 MED ORDER — DICYCLOMINE HCL 10 MG PO CAPS: 10.0000 mg | ORAL_CAPSULE | Freq: Three times a day (TID) | ORAL | Status: DC

## 2013-08-05 ENCOUNTER — Ambulatory Visit (INDEPENDENT_AMBULATORY_CARE_PROVIDER_SITE_OTHER): Payer: PRIVATE HEALTH INSURANCE | Admitting: Family Medicine

## 2013-08-05 ENCOUNTER — Encounter: Payer: Self-pay | Admitting: Family Medicine

## 2013-08-05 VITALS — BP 120/82 | Temp 98.9°F | Ht 68.0 in | Wt 220.6 lb

## 2013-08-05 DIAGNOSIS — H6691 Otitis media, unspecified, right ear: Secondary | ICD-10-CM

## 2013-08-05 DIAGNOSIS — H6690 Otitis media, unspecified, unspecified ear: Secondary | ICD-10-CM

## 2013-08-05 MED ORDER — SULFAMETHOXAZOLE-TMP DS 800-160 MG PO TABS: 1.0000 | ORAL_TABLET | Freq: Two times a day (BID) | ORAL | Status: DC

## 2013-08-05 MED ORDER — NEOMYCIN-POLYMYXIN-HC 3.5-10000-1 OT SOLN: 3.0000 [drp] | Freq: Four times a day (QID) | OTIC | Status: AC

## 2013-08-05 NOTE — Patient Instructions (Signed)
Otitis Media With Effusion Otitis media with effusion is the presence of fluid in the middle ear. This is a common problem in children, which often follows ear infections. It may be present for weeks or longer after the infection. Unlike an acute ear infection, otitis media with effusion refers only to fluid behind the ear drum and not infection. Children with repeated ear and sinus infections and allergy problems are the most likely to get otitis media with effusion. CAUSES  The most frequent cause of the fluid buildup is dysfunction of the eustachian tubes. These are the tubes that drain fluid in the ears to the to the back of the nose (nasopharynx). SYMPTOMS   The main symptom of this condition is hearing loss. As a result, you or your child may:  Listen to the TV at a loud volume.  Not respond to questions.  Ask "what" often when spoken to.  Mistake or confuse on sound or word for another.  There may be a sensation of fullness or pressure but usually not pain. DIAGNOSIS   Your health care provider will diagnose this condition by examining you or your child's ears.  Your health care provider may test the pressure in you or your child's ear with a tympanometer.  A hearing test may be conducted if the problem persists. TREATMENT   Treatment depends on the duration and the effects of the effusion.  Antibiotics, decongestants, nose drops, and cortisone-type drugs (tablets or nasal spray) may not be helpful.  Children with persistent ear effusions may have delayed language or behavioral problems. Children at risk for developmental delays in hearing, learning, and speech may require referral to a specialist earlier than children not at risk.  You or your child's health care provider may suggest a referral to an ear, nose, and throat surgeon for treatment. The following may help restore normal hearing:  Drainage of fluid.  Placement of ear tubes (tympanostomy tubes).  Removal of  adenoids (adenoidectomy). HOME CARE INSTRUCTIONS   Avoid second hand smoke.  Infants who are breast fed are less likely to have this condition.  Avoid feeding infants while laying flat.  Avoid known environmental allergens.  Avoid people who are sick. SEEK MEDICAL CARE IF:   Hearing is not better in 3 months.  Hearing is worse.  Ear pain.  Drainage from the ear.  Dizziness. MAKE SURE YOU:   Understand these instructions.  Will watch your condition.  Will get help right away if you are not doing well or get worse. Document Released: 08/03/2004 Document Revised: 04/16/2013 Document Reviewed: 01/21/2013 [facility] Patient Information 2014 [place], [place].

## 2013-08-05 NOTE — Progress Notes (Signed)
   Subjective:    Patient ID: [name], female    DOB: Mar 10, 1961, 53 y.o.   MRN: [id number]  HPI Patient arrives with right ear pain since Sat. Some coughing over the past few days no wheezing difficulty breathing no severe headache. No vomiting or diarrhea. PMH benign.  Review of Systems She relates right ear pain head congestion drainage    Objective:   Physical Exam  Right otitis media is noted left eardrum is normal throat is normal neck is supple lungs are clear extremities no edema      Assessment & Plan:  Left otitis media possible sinus infection antibiotics prescribed. If progressive troubles or if worse followup.

## 2013-08-18 ENCOUNTER — Ambulatory Visit (INDEPENDENT_AMBULATORY_CARE_PROVIDER_SITE_OTHER): Payer: PRIVATE HEALTH INSURANCE | Admitting: Psychiatry

## 2013-08-18 DIAGNOSIS — F259 Schizoaffective disorder, unspecified: Secondary | ICD-10-CM
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
## 2013-08-18 NOTE — Patient Instructions (Signed)
Discussed orally 

## 2013-09-04 ENCOUNTER — Other Ambulatory Visit: Payer: Self-pay | Admitting: Family Medicine

## 2013-09-08 ENCOUNTER — Other Ambulatory Visit: Payer: Self-pay | Admitting: Family Medicine

## 2013-09-08 DIAGNOSIS — Z1231 Encounter for screening mammogram for malignant neoplasm of breast: Secondary | ICD-10-CM

## 2013-09-09 ENCOUNTER — Encounter (HOSPITAL_COMMUNITY): Payer: Self-pay | Admitting: Psychiatry

## 2013-09-15 ENCOUNTER — Ambulatory Visit (HOSPITAL_COMMUNITY): Payer: Self-pay | Admitting: Psychiatry

## 2013-09-18 ENCOUNTER — Ambulatory Visit (HOSPITAL_COMMUNITY)
Admission: RE | Admit: 2013-09-18 | Discharge: 2013-09-18 | Disposition: A | Discharge status: Home / Self Care | Payer: PRIVATE HEALTH INSURANCE | Source: Ambulatory Visit | Attending: Family Medicine | Admitting: Family Medicine

## 2013-09-18 DIAGNOSIS — Z1231 Encounter for screening mammogram for malignant neoplasm of breast: Secondary | ICD-10-CM | POA: Insufficient documentation

## 2013-09-25 ENCOUNTER — Ambulatory Visit (HOSPITAL_COMMUNITY): Payer: Self-pay | Admitting: Psychiatry

## 2013-10-02 ENCOUNTER — Other Ambulatory Visit (HOSPITAL_COMMUNITY): Payer: Self-pay | Admitting: Psychiatry

## 2013-10-02 ENCOUNTER — Other Ambulatory Visit: Payer: Self-pay | Admitting: Family Medicine

## 2013-10-02 NOTE — Telephone Encounter (Signed)
Discontinue Relafen because of history of GI bleed, may refill the other one time 6.

## 2013-10-06 ENCOUNTER — Telehealth (HOSPITAL_COMMUNITY): Payer: Self-pay | Admitting: *Deleted

## 2013-10-08 ENCOUNTER — Telehealth (HOSPITAL_COMMUNITY): Payer: Self-pay | Admitting: *Deleted

## 2013-10-08 ENCOUNTER — Other Ambulatory Visit: Payer: Self-pay | Admitting: Family Medicine

## 2013-10-14 ENCOUNTER — Encounter (HOSPITAL_COMMUNITY): Payer: Self-pay | Admitting: Psychiatry

## 2013-10-14 ENCOUNTER — Ambulatory Visit (INDEPENDENT_AMBULATORY_CARE_PROVIDER_SITE_OTHER): Payer: PRIVATE HEALTH INSURANCE | Admitting: Psychiatry

## 2013-10-14 VITALS — BP 110/68 | Ht 68.0 in | Wt 216.0 lb

## 2013-10-14 DIAGNOSIS — F259 Schizoaffective disorder, unspecified: Secondary | ICD-10-CM

## 2013-10-14 DIAGNOSIS — F79 Unspecified intellectual disabilities: Secondary | ICD-10-CM

## 2013-10-14 MED ORDER — TRAZODONE HCL 100 MG PO TABS: 100.0000 mg | ORAL_TABLET | Freq: Every day | ORAL | Status: DC

## 2013-10-14 MED ORDER — LAMOTRIGINE 100 MG PO TABS: 100.0000 mg | ORAL_TABLET | Freq: Two times a day (BID) | ORAL | Status: DC

## 2013-10-14 MED ORDER — VORTIOXETINE HBR 10 MG PO TABS: 10.0000 mg | ORAL_TABLET | Freq: Every day | ORAL | Status: DC

## 2013-10-14 MED ORDER — PALIPERIDONE ER 9 MG PO TB24: 9.0000 mg | ORAL_TABLET | Freq: Every day | ORAL | Status: DC
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
## 2013-11-03 ENCOUNTER — Other Ambulatory Visit: Payer: Self-pay | Admitting: Family Medicine

## 2013-11-05 ENCOUNTER — Other Ambulatory Visit: Payer: Self-pay | Admitting: *Deleted

## 2013-11-05 ENCOUNTER — Telehealth (HOSPITAL_COMMUNITY): Payer: Self-pay | Admitting: *Deleted

## 2013-11-05 DIAGNOSIS — R911 Solitary pulmonary nodule: Secondary | ICD-10-CM

## 2013-11-05 NOTE — Telephone Encounter (Signed)
Med dced

## 2013-11-25 ENCOUNTER — Ambulatory Visit (INDEPENDENT_AMBULATORY_CARE_PROVIDER_SITE_OTHER): Payer: PRIVATE HEALTH INSURANCE | Admitting: Family Medicine

## 2013-11-25 ENCOUNTER — Encounter: Payer: Self-pay | Admitting: Family Medicine

## 2013-11-25 VITALS — BP 110/64 | Ht 70.5 in | Wt 227.0 lb

## 2013-11-25 DIAGNOSIS — E119 Type 2 diabetes mellitus without complications: Secondary | ICD-10-CM

## 2013-11-25 DIAGNOSIS — I10 Essential (primary) hypertension: Secondary | ICD-10-CM

## 2013-11-25 DIAGNOSIS — R911 Solitary pulmonary nodule: Secondary | ICD-10-CM

## 2013-11-25 LAB — POCT GLYCOSYLATED HEMOGLOBIN (HGB A1C): HEMOGLOBIN A1C: 5.5

## 2013-11-25 NOTE — Patient Instructions (Addendum)
DASH Diet The DASH diet stands for "Dietary Approaches to Stop Hypertension." It is a healthy eating plan that has been shown to reduce high blood pressure (hypertension) in as little as 14 days, while also possibly providing other significant health benefits. These other health benefits include reducing the risk of breast cancer after menopause and reducing the risk of type 2 diabetes, heart disease, colon cancer, and stroke. Health benefits also include weight loss and slowing kidney failure in patients with chronic kidney disease.  DIET GUIDELINES  Limit salt (sodium). Your diet should contain less than 1500 mg of sodium daily.  Limit refined or processed carbohydrates. Your diet should include mostly whole grains. Desserts and added sugars should be used sparingly.  Include small amounts of heart-healthy fats. These types of fats include nuts, oils, and tub margarine. Limit saturated and trans fats. These fats have been shown to be harmful in the body. CHOOSING FOODS  The following food groups are based on a 2000 calorie diet. See your Registered Dietitian for individual calorie needs. Grains and Grain Products (6 to 8 servings daily)  Eat More Often: Whole-wheat bread, brown rice, whole-grain or wheat pasta, quinoa, popcorn without added fat or salt (air popped).  Eat Less Often: White bread, white pasta, white rice, cornbread. Vegetables (4 to 5 servings daily)  Eat More Often: Fresh, frozen, and canned vegetables. Vegetables may be raw, steamed, roasted, or grilled with a minimal amount of fat.  Eat Less Often/Avoid: Creamed or fried vegetables. Vegetables in a cheese sauce. Fruit (4 to 5 servings daily)  Eat More Often: All fresh, canned (in natural juice), or frozen fruits. Dried fruits without added sugar. One hundred percent fruit juice ( cup [237 mL] daily).  Eat Less Often: Dried fruits with added sugar. Canned fruit in light or heavy syrup. [employer], Fish, and Poultry (2  servings or less daily. One serving is 3 to 4 oz [85-114 g]).  Eat More Often: Ninety percent or leaner ground beef, tenderloin, sirloin. Round cuts of beef, chicken breast, [place] breast. All fish. Grill, bake, or broil your meat. Nothing should be fried.  Eat Less Often/Avoid: Fatty cuts of meat, [place], or chicken leg, thigh, or wing. Fried cuts of meat or fish. Dairy (2 to 3 servings)  Eat More Often: Low-fat or fat-free milk, low-fat plain or light yogurt, reduced-fat or part-skim cheese.  Eat Less Often/Avoid: Milk (whole, 2%).Whole milk yogurt. Full-fat cheeses. Nuts, Seeds, and Legumes (4 to 5 servings per week)  Eat More Often: All without added salt.  Eat Less Often/Avoid: Salted nuts and seeds, canned beans with added salt. Fats and Sweets (limited)  Eat More Often: Vegetable oils, tub margarines without trans fats, sugar-free gelatin. Mayonnaise and salad dressings.  Eat Less Often/Avoid: Coconut oils, palm oils, butter, stick margarine, cream, half and half, cookies, candy, pie. FOR MORE INFORMATION The Dash Diet Eating Plan: www.dashdiet.org    2 Gram Low Sodium Diet A 2 gram sodium diet restricts the amount of sodium in the diet to no more than 2 g or 2000 mg daily. Limiting the amount of sodium is often used to help lower blood pressure. It is important if you have heart, liver, or kidney problems. Many foods contain sodium for flavor and sometimes as a preservative. When the amount of sodium in a diet needs to be low, it is important to know what to look for when choosing foods and drinks. The following includes some information and guidelines to help make it  easier for you to adapt to a low sodium diet. QUICK TIPS  Do not add salt to food.  Avoid convenience items and fast food.  Choose unsalted snack foods.  Buy lower sodium products, often labeled as "lower sodium" or "no salt added."  Check food labels to learn how much sodium is in 1 serving.  When eating  at a restaurant, ask that your food be prepared with less salt or none, if possible. READING FOOD LABELS FOR SODIUM INFORMATION The nutrition facts label is a good place to find how much sodium is in foods. Look for products with no more than 500 to 600 mg of sodium per meal and no more than 150 mg per serving. Remember that 2 g = 2000 mg. The food label may also list foods as:  Sodium-free: Less than 5 mg in a serving.  Very low sodium: 35 mg or less in a serving.  Low-sodium: 140 mg or less in a serving.  Light in sodium: 50% less sodium in a serving. For example, if a food that usually has 300 mg of sodium is changed to become light in sodium, it will have 150 mg of sodium.  Reduced sodium: 25% less sodium in a serving. For example, if a food that usually has 400 mg of sodium is changed to reduced sodium, it will have 300 mg of sodium. CHOOSING FOODS Grains  Avoid: Salted crackers and snack items. Some cereals, including instant hot cereals. Bread stuffing and biscuit mixes. Seasoned rice or pasta mixes.  Choose: Unsalted snack items. Low-sodium cereals, oats, puffed wheat and rice, shredded wheat. English muffins and bread. Pasta. Meats  Avoid: Salted, canned, smoked, spiced, pickled meats, including fish and poultry. Bacon, ham, sausage, cold cuts, hot dogs, anchovies.  Choose: Low-sodium canned tuna and salmon. Fresh or frozen meat, poultry, and fish. Dairy  Avoid: Processed cheese and spreads. Cottage cheese. Buttermilk and condensed milk. Regular cheese.  Choose: Milk. Low-sodium cottage cheese. Yogurt. Sour cream. Low-sodium cheese. Fruits and Vegetables  Avoid: Regular canned vegetables. Regular canned tomato sauce and paste. Frozen vegetables in sauces. Olives. Angie Fava. Relishes. Sauerkraut.  Choose: Low-sodium canned vegetables. Low-sodium tomato sauce and paste. Frozen or fresh vegetables. Fresh and frozen fruit. Condiments  Avoid: Canned and packaged gravies.  Worcestershire sauce. Tartar sauce. Barbecue sauce. Soy sauce. Steak sauce. Ketchup. Onion, garlic, and table salt. Meat flavorings and tenderizers.  Choose: Fresh and dried herbs and spices. Low-sodium varieties of mustard and ketchup. Lemon juice. Tabasco sauce. Horseradish. SAMPLE 2 GRAM SODIUM MEAL PLAN Breakfast / Sodium (mg)  1 cup low-fat milk / 846 mg  2 slices whole-wheat toast / 270 mg  1 tbs heart-healthy margarine / 153 mg  1 hard-boiled egg / 139 mg  1 small orange / 0 mg Lunch / Sodium (mg)  1 cup raw carrots / 76 mg   cup hummus / 298 mg  1 cup low-fat milk / 143 mg   cup red grapes / 2 mg  1 whole-wheat pita bread / 356 mg Dinner / Sodium (mg)  1 cup whole-wheat pasta / 2 mg  1 cup low-sodium tomato sauce / 73 mg  3 oz lean ground beef / 57 mg  1 small side salad (1 cup raw spinach leaves,  cup cucumber,  cup yellow bell pepper) with 1 tsp olive oil and 1 tsp red wine vinegar / 25 mg Snack / Sodium (mg)  1 container low-fat vanilla yogurt / 107 mg  3 graham cracker squares /  127 mg Nutrient Analysis  Calories: 2033  Protein: 77 g  Carbohydrate: 282 g  Fat: 72 g  Sodium: 1971 mg Document Released: 06/26/2005 Document Revised: 09/18/2011 Document Reviewed: 09/27/2009 [employer] Patient Information 2014 [place]. Document Released: 06/15/2011 Document Revised: 09/18/2011 Document Reviewed: 06/15/2011 [facility] Patient Information 2014 [place], [place].   Stop relafen ( Nabumetone)  Recheck here in 3 months

## 2013-11-25 NOTE — Progress Notes (Signed)
   Subjective:    Patient ID: [name], female    DOB: 17-Jun-1961, 53 y.o.   MRN: [id number]  HPIBilateral leg and foot pain and swelling. Started 3 weeks ago. She denies any chest pain shortness breath nausea vomiting diarrhea denies fever chills.  Concerns about having shakes in hands, arms, and legs. Started about 2 weeks ago. She states at times she gets anxious she does see specialists at the behavioral [facility]  Blood sugars running high around 200. The patient was seen today as part of a comprehensive diabetic check up. The patient had the following elements completed: -Review of medication compliance -Review of glucose monitoring results -Review of any complications do to high or low sugars -Diabetic foot exam was completed as part of today's visit. The following was also discussed: -Importance of yearly eye exams -Importance of following diabetic/low sugar-starch diet -Importance of exercise and regular activity -Importance of regular followup visits. -Most recent hemoglobin A1c were reviewed with the patient along with goals regarding diabetes.   Review of Systems  Constitutional: Negative for activity change, appetite change and fatigue.  HENT: Negative for congestion.   Respiratory: Negative for cough, choking and shortness of breath.   Cardiovascular: Positive for leg swelling. Negative for chest pain and palpitations.  Gastrointestinal: Negative for abdominal pain.  Endocrine: Negative for polydipsia and polyphagia.  Genitourinary: Negative for frequency.  Neurological: Negative for weakness.  Psychiatric/Behavioral: Negative for confusion.       Objective:   Physical Exam Lungs are clear hearts regular pulse normal blood pressure  Overall looks good. She does have 1-2+ pitting edema in the lower legs. She also has foot deformities decreased sensation decreased pulses consistent with diabetic neuropathy and foot changes. She is at high risk of developing  ulcers with a pre-ulcerative calluses.     Assessment & Plan:  July 9th Ct and OV Dr Servando Snare -he does have pulmonary nodule no be important for her to have these tests done in July her social worker has already got this set up.  Significant pedal edema she is not doing a good job eating healthy she needs to consume less salt stay more physically active and try to lose weight she will try to do so information was given to her. Continue diuretic.  #3 diabetes under very good control currently dietary measures exercise best path need. Her A1c does not-with the elevated glucoses that she states. Patient sometimes over states her symptoms.  #4 diabetic foot changes with callus changes this is concerning that she could develop some foot ulcers referral for diabetic shoes made  Followup within 4-6 weeks to recheck the feet

## 2013-12-02 ENCOUNTER — Other Ambulatory Visit (HOSPITAL_COMMUNITY): Payer: Self-pay | Admitting: Psychiatry

## 2013-12-05 ENCOUNTER — Other Ambulatory Visit: Payer: Self-pay | Admitting: Family Medicine

## 2013-12-08 ENCOUNTER — Telehealth (HOSPITAL_COMMUNITY): Payer: Self-pay | Admitting: *Deleted

## 2013-12-09 ENCOUNTER — Other Ambulatory Visit (HOSPITAL_COMMUNITY): Payer: Self-pay | Admitting: Psychiatry

## 2013-12-09 MED ORDER — BUPROPION HCL ER (SR) 150 MG PO TB12
150.0000 mg | ORAL_TABLET | Freq: Two times a day (BID) | ORAL | Status: DC
Start: 2013-12-09 — End: 2014-03-06

## 2013-12-09 NOTE — Telephone Encounter (Signed)
done

## 2014-01-01 ENCOUNTER — Ambulatory Visit: Payer: Self-pay | Admitting: Cardiothoracic Surgery

## 2014-01-01 ENCOUNTER — Other Ambulatory Visit: Payer: Self-pay

## 2014-01-02 ENCOUNTER — Other Ambulatory Visit (HOSPITAL_COMMUNITY): Payer: Self-pay | Admitting: Psychiatry

## 2014-01-02 ENCOUNTER — Telehealth (HOSPITAL_COMMUNITY): Payer: Self-pay | Admitting: *Deleted

## 2014-01-02 ENCOUNTER — Other Ambulatory Visit: Payer: Self-pay | Admitting: Family Medicine

## 2014-01-02 MED ORDER — TRAZODONE HCL 100 MG PO TABS: 100.0000 mg | ORAL_TABLET | Freq: Every day | ORAL | Status: DC

## 2014-01-06 ENCOUNTER — Other Ambulatory Visit: Payer: Self-pay

## 2014-01-06 MED ORDER — DICYCLOMINE HCL 10 MG PO CAPS: ORAL_CAPSULE | ORAL | Status: DC

## 2014-01-13 ENCOUNTER — Ambulatory Visit (HOSPITAL_COMMUNITY): Payer: Self-pay | Admitting: Psychiatry

## 2014-01-13 ENCOUNTER — Encounter (HOSPITAL_COMMUNITY): Payer: Self-pay | Admitting: Psychiatry

## 2014-01-15 ENCOUNTER — Encounter: Payer: Self-pay | Admitting: Cardiothoracic Surgery

## 2014-01-15 ENCOUNTER — Ambulatory Visit
Admission: RE | Admit: 2014-01-15 | Discharge: 2014-01-15 | Disposition: A | Discharge status: Home / Self Care | Payer: PRIVATE HEALTH INSURANCE | Source: Ambulatory Visit | Attending: Cardiothoracic Surgery | Admitting: Cardiothoracic Surgery

## 2014-01-15 ENCOUNTER — Ambulatory Visit (INDEPENDENT_AMBULATORY_CARE_PROVIDER_SITE_OTHER): Payer: Medicaid Other | Admitting: Cardiothoracic Surgery

## 2014-01-15 VITALS — BP 116/76 | HR 83 | Resp 16 | Ht 70.5 in | Wt 227.0 lb

## 2014-01-15 DIAGNOSIS — J984 Other disorders of lung: Secondary | ICD-10-CM

## 2014-01-15 DIAGNOSIS — R911 Solitary pulmonary nodule: Secondary | ICD-10-CM

## 2014-01-15 DIAGNOSIS — Z712 Person consulting for explanation of examination or test findings: Secondary | ICD-10-CM

## 2014-01-15 DIAGNOSIS — Z7189 Other specified counseling: Secondary | ICD-10-CM

## 2014-01-15 NOTE — Progress Notes (Signed)
[name]       [place] [address]             [phone]       [name]  Medical Record #[id number] Date of Birth: Oct 12, 1960  Referring: Kathyrn Drown, MD Primary Care: Sallee Lange, MD  Chief Complaint:    Lung Nodule   History of Present Illness:    Patient is a 53 year old female with long-term history of smoking who had the incidental finding of the 57mm nodule in the right middle lobe. She is referred to thoracic surgery for evaluation of this and to develop a treatment plan in July 2013 . She's had no previous history of lung cancer. She does have a history of limited mental capacity and comes to the office today with her social worker from [place]. She's had no history of tuberculosis or known tuberculosis exposure.  She does give a history of having a myocardial infarction in being hospitalized for 5 days at age 41 no other details about this revealed. She does note that at times especially with exertion she feels a pounding heart and chest discomfort with exertional shortness of breath. She denies resting shortness of breath.  She does give a history of having a right-sided stroke 5 years ago symptoms of this have resolved.   The patient was unable to complete pulmonary function studies, and in fact when doing the pulmonary function studies ended up in the emergency room with shortness of breath and chest pain.  She continues to smoke,     Current Activity/ Functional Status: Patient Patient Patient is independent with mobility/ambulation, transfers, ADL's, IADL's. The patient does level and but has assistance of a Education officer, museum because of her limited mental capacity.      Past Medical History  Diagnosis Date  . Mental retardation   . Hyperglycemia   . HTN (hypertension)   . Migraines   . Anxiety   . IBS (irritable bowel syndrome)   . Diabetes mellitus   . Lung nodules     right, followed by PCP, PET 11/2011     Past Surgical History  Procedure Laterality Date  . Abdominal hysterectomy    . Foot surgery    . External ear surgery      bilateral  . Glaucoma surgery    . Colonoscopy  11/2007    hyperplastic polyps, prior hx of adenomas   . Small bowel capsule  10/2007    normal  . Esophagogastroduodenoscopy  08/2007    moderate sized hiatal hernia  . Colonoscopy  05/2010    incomplete due to poor prep, hyperplastic rectal polyp  . Esophagogastroduodenoscopy  05/2010    noncritical schatzki ring s/p 60F  . Colonoscopy  05/05/2002    Dimunitive polyps in the rectum and left colon, cold    biopsied/removed.  Scattered few left-sided diverticula.  Regular colonic   mucosa appeared normal  . Esophagogastroduodenoscopy (egd) with esophageal dilation N/A 02/06/2013    XBJ:YNWGNF esophagus-s/p dilation up to a 30 [place] size with [facility] dilators.  Hiatal hernia  . Colonoscopy N/A 05/28/2013    Procedure: COLONOSCOPY;  Surgeon: Daneil Dolin, MD;  Location: [facility];  Service: Endoscopy;  Laterality: N/A;  8:30    Family History  Problem Relation Age of Onset  . Colon cancer      aunt  . Liver disease Neg Hx   . Inflammatory bowel disease Neg Hx   .  Stroke Mother   . Heart attack Father   . Obesity    . COPD    . GER disease    . Diabetes type II    . Anxiety disorder    . Depression    . Depression Sister   . Schizophrenia Sister   . Schizophrenia Other   . Drug abuse Other   . Alcohol abuse Other       History  Smoking status  . Current Every Day Smoker -- 0.25 packs/day for 40 years  . Types: Cigarettes  Smokeless tobacco  . Never Used    History  Alcohol Use No   Says does not drink alcohol      Allergies  Allergen Reactions  . Thorazine [Chlorpromazine Hcl] Anaphylaxis  . Acetaminophen Other (See Comments)    Makes pt dizzy  . Aspirin Other (See Comments)    seizure  . Aspirin-Acetaminophen-Caffeine Other (See Comments)    seizure  . Nsaids Nausea And  Vomiting  . Penicillins Nausea And Vomiting  . Tomato Rash    Current Outpatient Prescriptions  Medication Sig Dispense Refill  . buPROPion (WELLBUTRIN SR) 150 MG 12 hr tablet Take 1 tablet (150 mg total) by mouth 2 (two) times daily.  60 tablet  2  . calcium-vitamin D (OSCAL WITH D) 500-200 MG-UNIT per tablet Take 1 tablet by mouth 3 (three) times daily.        Marland Kitchen dicyclomine (BENTYL) 10 MG capsule TAKE 1 CAPSULE 4 TIMES DAILY BEFORE MEALS AND AT BEDTIME.  120 capsule  5  . lamoTRIgine (LAMICTAL) 100 MG tablet Take 1 tablet (100 mg total) by mouth 2 (two) times daily.  60 tablet  2  . lisinopril (PRINIVIL,ZESTRIL) 5 MG tablet TAKE (1) TABLET BY MOUTH ONCE DAILY.  30 tablet  2  . mupirocin ointment (BACTROBAN) 2 %       . NEXIUM 40 MG capsule TAKE 1 CAPSULE BY MOUTH ONCE A DAY.  30 capsule  2  . paliperidone (INVEGA) 9 MG 24 hr tablet Take 1 tablet (9 mg total) by mouth at bedtime.  30 tablet  2  . polyethylene glycol powder (GLYCOLAX/MIRALAX) powder Take 17 g by mouth daily.  527 g  3  . potassium chloride (K-DUR) 10 MEQ tablet TAKE (1) TABLET TWICE DAILY.  60 tablet  2  . torsemide (DEMADEX) 20 MG tablet TAKE 2 TABLETS BY MOUTH EVERY MORNING.  60 tablet  5  . traZODone (DESYREL) 100 MG tablet Take 1 tablet (100 mg total) by mouth at bedtime.  30 tablet  2  . Vortioxetine HBr (BRINTELLIX) 10 MG TABS Take 1 tablet (10 mg total) by mouth daily.  30 tablet  2   No current facility-administered medications for this visit.       Review of Systems:     Cardiac Review of Systems: Y or N  Chest Pain [ y ]  Resting SOB [ n  ] Exertional SOB  Blue.Reese ]  Vertell Limber Florencio.Farrier ]   Pedal Edema Blue.Reese   ]    Palpitations Blue.Reese  ] Syncope  Blue.Reese  ]   Presyncope [ n  ]  General Review of Systems: [Y] = yes [  ]=no Constitional: recent weight change [ y ]; anorexia [  ]; fatigue [  ]; nausea [  ]; night sweats [  ]; fever [  ]; or chills [  ];  Dental: poor dentition[y  ];  Eye : blurred vision [  ]; diplopia [   ]; vision changes [  ];  Amaurosis fugax[  ]; Resp: cough [  ];  wheezing[  ];  hemoptysis[  ]; shortness of breath[  ]; paroxysmal nocturnal dyspnea[  ]; dyspnea on exertion[  ]; or orthopnea[  ];  GI:  gallstones[  ], vomiting[  ];  dysphagia[  ]; melena[  ];  hematochezia [  ]; heartburn[  ];   Hx of  Colonoscopy[  ]; GU: kidney stones [  ]; hematuria[  ];   dysuria [  ];  nocturia[  ];  history of     obstruction [  ];             Skin: rash, swelling[  ];, hair loss[  ];  peripheral edema[  ];  or itching[  ]; Musculosketetal: myalgias[  ];  joint swelling[  ];  joint erythema[  ];  joint pain[  ];  back pain[  ];  Heme/Lymph: bruising[  ];  bleeding[  ];  anemia[  ];  Neuro: TIA[  ];  headaches[  ];  stroke[y  ];  vertigo[y  ];  seizures[y  ];   paresthesias[ y ];  difficulty walking[ y ];  Psych:depression[ y ]; anxiety[ y ];  Endocrine: diabetes[  ];  thyroid dysfunction[n  ];  Immunizations: Flu [ ? ]; Pneumococcal[?  ];  Other: Patient has a very positive review of systems including weight gain loss of appetite chest pain chest tightness shortness of breath and lying flat palpitation shortness of breath with exertion heart murmur atrial fibrillation arrhythmia productive cough bronchitis asthma wheezing abdominal pain constipation reflux trouble swallowing pain in her legs with walking pain feet when lying flat temporary blindness in one eye dizziness blackouts headaches muscle pain joint pain arthritis depression nervousness change in eyesight and change in hearing.  Physical Exam: .BP 116/76  Pulse 83  Resp 16  Ht 5' 10.5" (1.791 m)  Wt 227 lb (102.967 kg)  BMI 32.10 kg/m2  SpO2 90%   General appearance: alert, cooperative, appears older than stated age and no distress Neurologic: intact Heart: regular rate and rhythm, S1, S2 normal, no murmur, click,  rub or gallop and normal apical impulse Lungs: clear to auscultation bilaterally and normal percussion bilaterally Abdomen: soft, non-tender; bowel sounds normal; no masses,  no organomegaly Extremities: edema mild bil pedal edema and Homans sign is negative, no sign of DVT Wound:  Patient has no carotid bruits no cervical supraclavicular adenopathy is appreciated, especially in the area noted on that scan in the right axilla   Diagnostic Studies & Laboratory data:     Recent Radiology Findings:  Ct Chest Wo Contrast  01/15/2014   CLINICAL DATA:  Shortness of breath. Cough. Hemoptysis. History of smoking. Followup evaluation of lung nodule.  EXAM: CT CHEST WITHOUT CONTRAST  TECHNIQUE: Multidetector CT imaging of the chest was performed following the standard protocol without IV contrast.  COMPARISON:  Chest CT 01/02/2013.  FINDINGS: Mediastinum: Heart size is normal. There is no significant pericardial fluid, thickening or pericardial calcification. There is atherosclerosis of the thoracic aorta, the great vessels of the mediastinum and the coronary arteries, including calcified atherosclerotic plaque in the left anterior descending coronary artery. No pathologically enlarged mediastinal or hilar lymph nodes. Please note that accurate exclusion of hilar adenopathy is limited on noncontrast CT scans. Esophagus is unremarkable in appearance.  Lungs/Pleura: 12 x 7 mm well-circumscribed  nodule in the mid medial segment of the right middle lobe (image 38 of series 4) is only slightly larger than remote prior study 11/01/2011 at which point it measured 11 x 7 mm when measured in a similar fashion on image 34 of series 3 of that examination. No other suspicious appearing pulmonary nodules or masses. No acute consolidative airspace disease. No pleural effusions.  Upper Abdomen: Unremarkable.  Musculoskeletal: There are no aggressive appearing lytic or blastic lesions noted in the visualized portions of the  skeleton.  IMPRESSION: 1. 12 x 7 mm well-circumscribed nodule in the mid medial segment of the right middle lobe (image 38 of series 4) is only slightly larger than remote prior study 11/01/2011 at which point it measured 11 x 7 mm when measured in a similar fashion on image 34 of series 3 of that examination. Given the relative stability over this 2 year time interval, this is strongly favored to represent a benign lesion, potentially a hammartoma (some hammartomas contain brown fat, which may account for low-level metabolic activity on the prior PET-CT). Another differential consideration would be in a low-grade carcinoid tumor. An additional noncontrast chest CT in 1-2 years is suggested to ensure continued stability.   Electronically Signed   By: Vinnie Langton M.D.   On: 01/15/2014 13:24  Ct Chest Wo Contrast  01/02/2013   *RADIOLOGY REPORT*  Clinical Data: Right lung nodule.  History of breast cancer.  CT CHEST WITHOUT CONTRAST  Technique:  Multidetector CT imaging of the chest was performed following the standard protocol without IV contrast.  Comparison: Chest CT 04/22/2012.  Findings: The right middle lobe pulmonary nodule is unchanged. Measures 10.7 x 6.9 mm and previous measure 10.3 x 7.4 mm.  No new nodules are identified.  No acute pulmonary findings.  No pleural effusion.  The heart is normal in size.  No pericardial effusion.  No mediastinal or hilar lymphadenopathy.  Small scattered lymph nodes are stable.  The aorta is normal in caliber.  The esophagus is grossly normal.  The chest wall is unremarkable.  Stable calcifications of the right breast.  No supraclavicular or axillary adenopathy.  Stable bilateral axillary lymph nodes.  The thyroid gland is normal.  The bony thorax is intact.  IMPRESSION:  Stable right middle lobe pulmonary nodule.  Recommend follow-up noncontrast chest CT in 1 year to document stability.   Original Report Authenticated By: Marijo Sanes, M.D.     Ct Chest W  Contrast  04/22/2012  *RADIOLOGY REPORT*  Clinical Data: Follow-up pulmonary nodule.  CT CHEST WITH CONTRAST  Technique:  Multidetector CT imaging of the chest was performed following the standard protocol during bolus administration of intravenous contrast.  Contrast: 69mL OMNIPAQUE IOHEXOL 300 MG/ML  SOLN  Comparison: PET 11/29/2011 and CT chest 11/01/2011.  Findings: No pathologically enlarged mediastinal, hilar or axillary lymph nodes.  Heart size normal.  No pericardial effusion.  A nodule in the right middle lobe measures 10 x 7 mm, stable from 11/01/2011 when remeasured.  A tiny subpleural nodular density in the right lower lobe measures 3 mm (image 42), stable.  No pleural fluid.  Airway is unremarkable.  Incidental imaging of the upper abdomen shows low attenuation throughout the visualized portion of the liver.  No worrisome lytic or sclerotic lesions.  IMPRESSION: Right middle lobe nodule is unchanged from 11/01/2011.  Given mild hypermetabolism on 11/29/2011, a follow-up interval of 6 months is recommended to confirm continued stability.   Original Report Authenticated By: Orlene Erm.  Rosario Jacks, M.D.      11/09/2011 RADIOLOGY REPORT*  Clinical Data: Right basilar pulmonary nodule on abdominal CT.  Dysphagia. The patient has diabetes mellitus and unspecified  mental retardation.  CT CHEST WITH CONTRAST  Technique: Multidetector CT imaging of the chest was performed  following the standard protocol during bolus administration of  intravenous contrast.  Contrast: 83mL OMNIPAQUE IOHEXOL 300 MG/ML SOLN  Comparison: Abdominal CTs 10/10/2011 and 01/19/2005. Chest  radiographs 10/23/2001.  Findings: As demonstrated on the recent abdominal CT, there is a  lobulated well circumscribed 10 x 9 mm right middle lobe nodule on  image 35. This is noncalcified. This area of the lung bases was  not imaged on the 2006 CT. No other pulmonary nodules are  identified. There is no airspace disease or confluent  airspace  opacity.  There are no enlarged mediastinal or hilar lymph nodes. There is  no pleural or pericardial effusion. Minimal aortic atherosclerosis  is noted.  Images through the upper abdomen demonstrate diffuse hepatic  steatosis. There is no adrenal mass.  IMPRESSION:  1. Lobulated noncalcified 1-cm right middle lobe nodule was not  imaged on prior abdominal CT. This nodule is not conclusively  demonstrated on radiographs such that stability cannot be  addressed.  2. This nodule is nonspecific and could reflect a hamartoma or  other benign lesion. However, malignancy cannot be excluded.  3. No other nodules, adenopathy or acute findings.  This nodule would be difficult to biopsy given its close proximity  to the heart. Management options include PET CT to assess for  hypermetabolic activity and follow-up CT (depending on risk factors  for malignancy). If follow-up is elected, follow-up chest CT at 3  months is recommended. This recommendation is adapted from the  consensus statement: Guidelines for Management of Small Pulmonary  Nodules Detected on CT Scans: A Statement from the [employer] as published in Radiology 2005; 237:395-400.  Original Report Authenticated By: Vivia Ewing, M.D.  Halina Andreas, MD Tue Jan 09, 2012 2:52:39 PM EDT       **ADDENDUM** CREATED: 01/09/2012 14:43:25  Voice recognition errors:  Within the Findings section, the second sentence of the CHEST  section should read: "NO additional hypermetabolic nodules are  present."  Within the first IMPRESSION, the last phrase of the first sentence  should read: "for a nodule this SIZE."  Findings discussed with Dr. Servando Snare on 07/ 08/2011  **END ADDENDUM** SIGNED BY: Suzy Bouchard, M.D.      Study Result     *RADIOLOGY REPORT*  Clinical Data: Recent imaging demonstrates a solitary pulmonary  nodule. FDG PET CT requested to evaluate for possible malignancy.  NUCLEAR MEDICINE PET  SKULL BASE TO THIGH  Fasting Blood Glucose: 113  Technique: 18.4 mCi F-18 FDG was injected intravenously via the  right antecubital fossa. CT data was obtained and used for  attenuation correction and anatomic localization only. (This was  not acquired as a diagnostic CT examination.) Additional exam  technical data entered on technologist worksheet.  Comparison: CT 11/01/2011  Findings:  Head/Neck: No hypermetabolic lymph nodes in the neck.  Chest: Within the right middle lobe there is a 8 mm pulmonary  nodule adjacent to the right atrium with mild metabolic activity (  SUV max = 2.5). Additional hypermetabolic nodules are present. No  hypermetabolic mediastinal lymph nodes.  There is a single hypermetabolic right axillary lymph node which is  normal pathology. There is misregistration in the right axilla due  to  patient motion.  Abdomen/Pelvis: No abnormal hypermetabolic activity within the  liver, pancreas, adrenal glands, or spleen. No hypermetabolic  lymph nodes in the abdomen or pelvis.  Skeleton: No focal hypermetabolic activity to suggest skeletal  metastasis. Sclerosis of the left SI joint appears benign.  IMPRESSION:  1. Mildly hypermetabolic nodule within the right middle lobe.  Although the activity is mild, for a nodule this side this is  concerning although nonspecific. If biopsy is not feasible due to  location, recommend short-term follow-up CT with contrast in 1 to  3 months.  2. No evidence of metastasis.  3. Hypermetabolic right axial lymph node is likely reactive.  Original Report Authenticated By: Suzy Bouchard, M.D.       Recent Lab Findings: Lab Results  Component Value Date   WBC 4.8 03/04/2013   HGB 14.8 03/07/2013   HCT 40.4 03/04/2013   PLT 236 03/04/2013   GLUCOSE 75 05/01/2013   CHOL 173 05/01/2013   TRIG 86 05/01/2013   HDL 56 05/01/2013   LDLCALC 100* 05/01/2013   ALT 14 05/01/2013   AST 19 05/01/2013   NA 135 05/01/2013   K 3.7  05/01/2013   CL 93* 05/01/2013   CREATININE 0.84 05/01/2013   BUN 5* 05/01/2013   CO2 31 05/01/2013   HGBA1C 5.5 11/25/2013      Assessment / Plan:   Right middle lobe lung nodule-Given the relative stability over  2 year time interval, this is strongly favored to represent a benign lesion, potentially a hammartoma  Unfortunately the patient continues to smoke and in spite of attempts to have her stop it appears unlikely that she will be able to. Since there is and no change in the size of the nodule over 8 months, I recommended to her that we repeat the scan in one year. I have again reviewed with her the need to stop smoking. However all poor medical condition and inability even to complete pulmonary function studies would make any type of surgical resection very difficult.  I plan to see the patient back in one year with a followup CT scan of the chest        Grace Isaac MD  Beeper [phone] Office [phone] 01/15/2014 1:36 PM

## 2014-01-27 ENCOUNTER — Emergency Department (HOSPITAL_COMMUNITY): Payer: Medicare Other

## 2014-01-27 ENCOUNTER — Emergency Department (HOSPITAL_COMMUNITY)
Admission: EM | Admit: 2014-01-27 | Discharge: 2014-01-27 | Disposition: A | Discharge status: Home / Self Care | Payer: Medicare Other | Attending: Emergency Medicine | Admitting: Emergency Medicine

## 2014-01-27 ENCOUNTER — Encounter (HOSPITAL_COMMUNITY): Payer: Self-pay | Admitting: Emergency Medicine

## 2014-01-27 DIAGNOSIS — IMO0002 Reserved for concepts with insufficient information to code with codable children: Secondary | ICD-10-CM | POA: Insufficient documentation

## 2014-01-27 DIAGNOSIS — R0789 Other chest pain: Secondary | ICD-10-CM

## 2014-01-27 DIAGNOSIS — F411 Generalized anxiety disorder: Secondary | ICD-10-CM | POA: Diagnosis not present

## 2014-01-27 DIAGNOSIS — Z79899 Other long term (current) drug therapy: Secondary | ICD-10-CM | POA: Diagnosis not present

## 2014-01-27 DIAGNOSIS — F17200 Nicotine dependence, unspecified, uncomplicated: Secondary | ICD-10-CM | POA: Insufficient documentation

## 2014-01-27 DIAGNOSIS — E119 Type 2 diabetes mellitus without complications: Secondary | ICD-10-CM | POA: Diagnosis not present

## 2014-01-27 DIAGNOSIS — IMO0001 Reserved for inherently not codable concepts without codable children: Secondary | ICD-10-CM | POA: Insufficient documentation

## 2014-01-27 DIAGNOSIS — S298XXA Other specified injuries of thorax, initial encounter: Secondary | ICD-10-CM | POA: Insufficient documentation

## 2014-01-27 DIAGNOSIS — I10 Essential (primary) hypertension: Secondary | ICD-10-CM | POA: Diagnosis not present

## 2014-01-27 DIAGNOSIS — I252 Old myocardial infarction: Secondary | ICD-10-CM | POA: Diagnosis not present

## 2014-01-27 DIAGNOSIS — Z880 Allergy status to penicillin: Secondary | ICD-10-CM | POA: Diagnosis not present

## 2014-01-27 DIAGNOSIS — G43909 Migraine, unspecified, not intractable, without status migrainosus: Secondary | ICD-10-CM | POA: Diagnosis not present

## 2014-01-27 DIAGNOSIS — Y9289 Other specified places as the place of occurrence of the external cause: Secondary | ICD-10-CM | POA: Diagnosis not present

## 2014-01-27 DIAGNOSIS — Y9389 Activity, other specified: Secondary | ICD-10-CM | POA: Diagnosis not present

## 2014-01-27 DIAGNOSIS — S3992XA Unspecified injury of lower back, initial encounter: Secondary | ICD-10-CM

## 2014-01-27 DIAGNOSIS — W010XXA Fall on same level from slipping, tripping and stumbling without subsequent striking against object, initial encounter: Secondary | ICD-10-CM | POA: Diagnosis not present

## 2014-01-27 DIAGNOSIS — Z9889 Other specified postprocedural states: Secondary | ICD-10-CM | POA: Diagnosis not present

## 2014-01-27 DIAGNOSIS — K589 Irritable bowel syndrome without diarrhea: Secondary | ICD-10-CM | POA: Insufficient documentation

## 2014-01-27 LAB — BASIC METABOLIC PANEL
ANION GAP: 10 (ref 5–15)
BUN: 5 mg/dL — ABNORMAL LOW (ref 6–23)
CHLORIDE: 98 meq/L (ref 96–112)
CO2: 31 mEq/L (ref 19–32)
Calcium: 9.3 mg/dL (ref 8.4–10.5)
Creatinine, Ser: 0.83 mg/dL (ref 0.50–1.10)
GFR calc Af Amer: >90 mL/min (ref >90)
GFR calc non Af Amer: 80 mL/min — ABNORMAL LOW (ref >90)
Glucose, Bld: 110 mg/dL — ABNORMAL HIGH (ref 70–99)
POTASSIUM: 3.8 meq/L (ref 3.7–5.3)
SODIUM: 139 meq/L (ref 137–147)

## 2014-01-27 LAB — URINALYSIS, ROUTINE W REFLEX MICROSCOPIC
BILIRUBIN URINE: NEGATIVE
Glucose, UA: NEGATIVE mg/dL
Hgb urine dipstick: NEGATIVE
Ketones, ur: NEGATIVE mg/dL
LEUKOCYTES UA: NEGATIVE
NITRITE: NEGATIVE
Protein, ur: NEGATIVE mg/dL
SPECIFIC GRAVITY, URINE: 1.01 (ref 1.005–1.030)
Urobilinogen, UA: 0.2 mg/dL (ref 0.0–1.0)
pH: 5.5 (ref 5.0–8.0)

## 2014-01-27 LAB — CBC WITH DIFFERENTIAL/PLATELET
BASOS ABS: 0 10*3/uL (ref 0.0–0.1)
Basophils Relative: 1 % (ref 0–1)
Eosinophils Absolute: 0 10*3/uL (ref 0.0–0.7)
Eosinophils Relative: 1 % (ref 0–5)
HCT: 39.4 % (ref 36.0–46.0)
Hemoglobin: 13.5 g/dL (ref 12.0–15.0)
LYMPHS PCT: 43 % (ref 12–46)
Lymphs Abs: 1.7 10*3/uL (ref 0.7–4.0)
MCH: 30 pg (ref 26.0–34.0)
MCHC: 34.3 g/dL (ref 30.0–36.0)
MCV: 87.6 fL (ref 78.0–100.0)
Monocytes Absolute: 0.3 10*3/uL (ref 0.1–1.0)
Monocytes Relative: 8 % (ref 3–12)
NEUTROS ABS: 1.9 10*3/uL (ref 1.7–7.7)
Neutrophils Relative %: 47 % (ref 43–77)
Platelets: 178 10*3/uL (ref 150–400)
RBC: 4.5 MIL/uL (ref 3.87–5.11)
RDW: 14.5 % (ref 11.5–15.5)
WBC: 4 10*3/uL (ref 4.0–10.5)

## 2014-01-27 LAB — TROPONIN I: Troponin I: <0.3 ng/mL (ref <0.30)

## 2014-01-27 MED ORDER — IOHEXOL 350 MG/ML SOLN
120.0000 mL | Freq: Once | INTRAVENOUS | Status: AC | PRN
  Administered 2014-01-27: 120 mL via INTRAVENOUS

## 2014-01-27 MED ORDER — HYDROCODONE-ACETAMINOPHEN 5-325 MG PO TABS: 2.0000 | ORAL_TABLET | Freq: Once | ORAL | Status: DC

## 2014-01-27 MED ORDER — SODIUM CHLORIDE 0.9 % IJ SOLN
INTRAMUSCULAR | Status: AC
  Filled 2014-01-27: qty 500

## 2014-01-27 MED ORDER — HYDROCODONE-ACETAMINOPHEN 5-325 MG PO TABS: 2.0000 | ORAL_TABLET | ORAL | Status: DC | PRN

## 2014-01-27 MED ORDER — SODIUM CHLORIDE 0.9 % IJ SOLN
INTRAMUSCULAR | Status: AC
  Filled 2014-01-27: qty 45

## 2014-01-27 MED ORDER — HYDROCODONE-ACETAMINOPHEN 5-325 MG PO TABS
1.0000 | ORAL_TABLET | Freq: Once | ORAL | Status: AC
  Administered 2014-01-27: 1 via ORAL
  Filled 2014-01-27: qty 1

## 2014-01-27 MED ORDER — HYDROCODONE-ACETAMINOPHEN 5-325 MG PO TABS
2.0000 | ORAL_TABLET | Freq: Once | ORAL | Status: AC
  Administered 2014-01-27: 2 via ORAL
  Filled 2014-01-27: qty 2

## 2014-01-27 NOTE — ED Notes (Signed)
Patient ambulatory with cane.  Patient was able to walk without assistance of anyone.

## 2014-01-27 NOTE — ED Notes (Signed)
Fall Sunday and hurt back.  Rating pain 10.

## 2014-01-27 NOTE — Discharge Instructions (Signed)
Tailbone Injury Take the pain medication as prescribed. Follow up with your doctor. Return to the ED if you develop new or worsening symptoms. The tailbone (coccyx) is the small bone at the lower end of the spine. A tailbone injury may involve stretched ligaments, bruising, or a broken bone (fracture). Women are more vulnerable to this injury due to having a wider pelvis. CAUSES  This type of injury typically occurs from falling and landing on the tailbone. Repeated strain or friction from actions such as rowing and bicycling may also injure the area. The tailbone can be injured during childbirth. Infections or tumors may also press on the tailbone and cause pain. Sometimes, the cause of injury is unknown. SYMPTOMS   Bruising.  Pain when sitting.  Painful bowel movements.  In women, pain during intercourse. DIAGNOSIS  Your caregiver can diagnose a tailbone injury based on your symptoms and a physical exam. X-rays may be taken if a fracture is suspected. Your caregiver may also use an MRI scan imaging test to evaluate your symptoms. TREATMENT  Your caregiver may prescribe medicines to help relieve your pain. Most tailbone injuries heal on their own in 4 to 6 weeks. However, if the injury is caused by an infection or tumor, the recovery period may vary. PREVENTION  Wear appropriate padding and sports gear when bicycling and rowing. This can help prevent an injury from repeated strain or friction. HOME CARE INSTRUCTIONS   Put ice on the injured area.  Put ice in a plastic bag.  Place a towel between your skin and the bag.  Leave the ice on for 15-20 minutes, every hour while awake for the first 1 to 2 days.  Sit on a large, rubber or inflated ring or cushion to ease your pain. Lean forward when sitting to help decrease discomfort.  Avoid sitting for long periods of time.  Increase your activity as the pain allows.  Only take over-the-counter or prescription medicines for pain,  discomfort, or fever as directed by your caregiver.  You may use stool softeners if it is painful to have a bowel movement, or as directed by your caregiver.  Eat a diet with plenty of fiber to help prevent constipation.  Keep all follow-up appointments as directed by your caregiver. SEEK MEDICAL CARE IF:   Your pain becomes worse.  Your bowel movements cause a great deal of discomfort.  You are unable to have a bowel movement.  You have a fever. MAKE SURE YOU:  Understand these instructions.  Will watch your condition.  Will get help right away if you are not doing well or get worse. Document Released: 06/23/2000 Document Revised: 09/18/2011 Document Reviewed: 01/19/2011 [facility] Patient Information 2015 [place], [place]. This information is not intended to replace advice given to you by your health care provider. Make sure you discuss any questions you have with your health care provider.

## 2014-01-27 NOTE — ED Provider Notes (Signed)
CSN: [id number]     Arrival date & time 01/27/14  1252 History   First MD Initiated Contact with Patient 01/27/14 1323     Chief Complaint  Patient presents with  . Back Pain     (Consider location/radiation/quality/duration/timing/severity/associated sxs/prior Treatment) HPI Comments: Patient presents with mid and low back pain after a fall 2 nights ago. She states she tripped and fell backwards hitting her head or losing consciousness for a few seconds. She states she was on the floor for 45 minutes before she was helped up. Denies any chest pain or shortness of breath. Complains of pain in her mid and low back that radiates down her right leg. There is no numbness or tingling. There is no bowel or bladder incontinence. There is no fever or vomiting. She complains of pain in the back of her head. There is no neck pain. There is no abdominal pain. Denies any previous history of back problems. She taking Tylenol without relief. She also complains of a three-day history of constant right-sided chest pain has been constant since the fall. Nothing makes it better or worse. It does not radiate. He has a remote history of MI by her report. She doesn't know if she has any stents in her heart. The pain is worse with palpation.  The history is provided by the patient.    Past Medical History  Diagnosis Date  . Mental retardation   . Hyperglycemia   . HTN (hypertension)   . Migraines   . Anxiety   . IBS (irritable bowel syndrome)   . Diabetes mellitus   . Lung nodules     right, followed by PCP, PET 11/2011  . MI (myocardial infarction)   . Diabetes mellitus, type II    Past Surgical History  Procedure Laterality Date  . Abdominal hysterectomy    . Foot surgery    . External ear surgery      bilateral  . Glaucoma surgery    . Colonoscopy  11/2007    hyperplastic polyps, prior hx of adenomas   . Small bowel capsule  10/2007    normal  . Esophagogastroduodenoscopy  08/2007    moderate  sized hiatal hernia  . Colonoscopy  05/2010    incomplete due to poor prep, hyperplastic rectal polyp  . Esophagogastroduodenoscopy  05/2010    noncritical schatzki ring s/p 63F  . Colonoscopy  05/05/2002    Dimunitive polyps in the rectum and left colon, cold    biopsied/removed.  Scattered few left-sided diverticula.  Regular colonic   mucosa appeared normal  . Esophagogastroduodenoscopy (egd) with esophageal dilation N/A 02/06/2013    YNW:GNFAOZ esophagus-s/p dilation up to a 27 [place] size with [facility] dilators.  Hiatal hernia  . Colonoscopy N/A 05/28/2013    Procedure: COLONOSCOPY;  Surgeon: Daneil Dolin, MD;  Location: [facility];  Service: Endoscopy;  Laterality: N/A;  8:30   Family History  Problem Relation Age of Onset  . Colon cancer      aunt  . Liver disease Neg Hx   . Inflammatory bowel disease Neg Hx   . Stroke Mother   . Heart attack Father   . Obesity    . COPD    . GER disease    . Diabetes type II    . Anxiety disorder    . Depression    . Depression Sister   . Schizophrenia Sister   . Schizophrenia Other   . Drug abuse Other   .  Alcohol abuse Other    History  Substance Use Topics  . Smoking status: Current Every Day Smoker -- 0.25 packs/day for 40 years    Types: Cigarettes  . Smokeless tobacco: Never Used  . Alcohol Use: No     Comment: social drinker   OB History   Grav Para Term Preterm Abortions TAB SAB Ect Mult Living                 Review of Systems  Constitutional: Negative for fever, activity change and appetite change.  Eyes: Negative for photophobia.  Respiratory: Negative for chest tightness and shortness of breath.   Cardiovascular: Positive for chest pain.  Gastrointestinal: Negative for nausea, vomiting and abdominal pain.  Genitourinary: Negative for dysuria and hematuria.  Musculoskeletal: Positive for arthralgias, back pain and myalgias. Negative for neck pain.  Skin: Negative for wound.  Neurological: Negative for  weakness and headaches.  A complete 10 system review of systems was obtained and all systems are negative except as noted in the HPI and PMH.      Allergies  Thorazine; Acetaminophen; Aspirin; Aspirin-acetaminophen-caffeine; Nsaids; Penicillins; and Tomato  Home Medications   Prior to Admission medications   Medication Sig Start Date End Date Taking? Authorizing Provider  acetaminophen-codeine (TYLENOL #3) 300-30 MG per tablet Take 2 tablets by mouth every 12 (twelve) hours as needed for moderate pain.   Yes Historical Provider, MD  buPROPion (WELLBUTRIN SR) 150 MG 12 hr tablet Take 1 tablet (150 mg total) by mouth 2 (two) times daily. 12/09/13  Yes Levonne Spiller, MD  calcium-vitamin D (OSCAL WITH D) 500-200 MG-UNIT per tablet Take 1 tablet by mouth 3 (three) times daily.     Yes Historical Provider, MD  dicyclomine (BENTYL) 10 MG capsule TAKE 1 CAPSULE 4 TIMES DAILY BEFORE MEALS AND AT BEDTIME. 01/06/14  Yes Kathyrn Drown, MD  lamoTRIgine (LAMICTAL) 100 MG tablet Take 1 tablet (100 mg total) by mouth 2 (two) times daily. 10/14/13  Yes Levonne Spiller, MD  lisinopril (PRINIVIL,ZESTRIL) 5 MG tablet TAKE (1) TABLET BY MOUTH ONCE DAILY. 01/02/14  Yes Kathyrn Drown, MD  mupirocin ointment (BACTROBAN) 2 % Apply 1 application topically 2 (two) times daily.  05/08/13  Yes Historical Provider, MD  NEXIUM 40 MG capsule TAKE 1 CAPSULE BY MOUTH ONCE A DAY. 01/02/14  Yes Kathyrn Drown, MD  paliperidone (INVEGA) 9 MG 24 hr tablet Take 9 mg by mouth daily.    Yes Historical Provider, MD  polyethylene glycol (MIRALAX / GLYCOLAX) packet Take 17 g by mouth daily as needed for mild constipation.   Yes Historical Provider, MD  potassium chloride (K-DUR) 10 MEQ tablet TAKE (1) TABLET TWICE DAILY. 01/02/14  Yes Kathyrn Drown, MD  torsemide (DEMADEX) 20 MG tablet TAKE 2 TABLETS BY MOUTH EVERY MORNING.   Yes Kathyrn Drown, MD  traZODone (DESYREL) 100 MG tablet Take 1 tablet (100 mg total) by mouth at bedtime. 01/02/14   Yes Levonne Spiller, MD  Vortioxetine HBr (BRINTELLIX) 10 MG TABS Take 1 tablet by mouth 2 (two) times daily.   Yes Historical Provider, MD  HYDROcodone-acetaminophen (NORCO/VICODIN) 5-325 MG per tablet Take 2 tablets by mouth every 4 (four) hours as needed. 01/27/14   Ezequiel Essex, MD   BP 123/56  Pulse 79  Temp(Src) 98.9 F (37.2 C) (Oral)  Resp 18  SpO2 100% Physical Exam  Nursing note and vitals reviewed. Constitutional: She is oriented to person, place, and time. She appears well-developed and  well-nourished. No distress.  HENT:  Head: Normocephalic and atraumatic.  Mouth/Throat: Oropharynx is clear and moist. No oropharyngeal exudate.  Eyes: Conjunctivae and EOM are normal. Pupils are equal, round, and reactive to light.  Neck: Normal range of motion. Neck supple.  No C-spine tenderness   Cardiovascular: Normal rate, regular rhythm, normal heart sounds and intact distal pulses.   No murmur heard. Pulmonary/Chest: Effort normal and breath sounds normal. No respiratory distress. She exhibits tenderness.  Right sided chest tenderness reproduces patient's pain exactly  Abdominal: Soft. There is no tenderness. There is no rebound and no guarding.  Musculoskeletal: Normal range of motion. She exhibits tenderness. She exhibits no edema.  Diffuse tenderness of thoracic and lumbar spine without step-off or deformity. 5/5 strength in bilateral lower extremities. Ankle plantar and dorsiflexion intact. Great toe extension intact bilaterally. +2 DP and PT pulses. +2 patellar reflexes bilaterally. Normal gait.   Neurological: She is alert and oriented to person, place, and time. No cranial nerve deficit. She exhibits normal muscle tone. Coordination normal.  5/5 strength throughout, CN 2-12 intact  Skin: Skin is warm.  Psychiatric: She has a normal mood and affect. Her behavior is normal.    ED Course  Procedures (including critical care time) Labs Review Labs Reviewed  BASIC METABOLIC  PANEL - Abnormal; Notable for the following:    Glucose, Bld 110 (*)    BUN 5 (*)    GFR calc non Af Amer 80 (*)    All other components within normal limits  URINALYSIS, ROUTINE W REFLEX MICROSCOPIC  CBC WITH DIFFERENTIAL  TROPONIN I  TROPONIN I    Imaging Review Dg Chest 1 View  01/27/2014   CLINICAL DATA:  Upper and lower back pain  EXAM: CHEST - 1 VIEW  COMPARISON:  None.  FINDINGS: The heart size and mediastinal contours are within normal limits. Both lungs are clear. The visualized skeletal structures are unremarkable.  IMPRESSION: No active disease.   Electronically Signed   By: Kathreen Devoid   On: 01/27/2014 15:36   Dg Thoracic Spine W/swimmers  01/27/2014   CLINICAL DATA:  Upper and lower back pain. Sacrum and coccyx pain. Fall landing on the back.  EXAM: THORACIC SPINE - 2 VIEW + SWIMMERS  COMPARISON:  01/15/2014 chest CT.  FINDINGS: Moderate lower cervical spondylosis. Thoracic spinal alignment and vertebral body height is preserved. The paraspinal lines appear normal. Monitoring leads project over the chest. Mild mid to lower thoracic spondylosis.  IMPRESSION: Mild thoracic spondylosis without an acute osseous abnormality.   Electronically Signed   By: Dereck Ligas M.D.   On: 01/27/2014 15:36   Dg Lumbar Spine Complete  01/27/2014   CLINICAL DATA:  Both upper and lower back pain, sacrum pain, coccyx pain, after falling and landing on back 2 days ago  EXAM: LUMBAR SPINE - COMPLETE 4+ VIEW  COMPARISON:  None.  FINDINGS: Normal alignment. Mild degenerative disc disease at L2-3, L3-4, and L4-5. Subtle focal irregularity involving the inferior tip of the sacrum just above the sacrococcygeal junction suggests nondisplaced fracture.  IMPRESSION: Subtle fracture involving the inferior tip of the sacrum.   Electronically Signed   By: Skipper Cliche M.D.   On: 01/27/2014 15:36   Dg Sacrum/coccyx  01/27/2014   CLINICAL DATA:  Pain.  EXAM: SACRUM AND COCCYX - 2+ VIEW  COMPARISON:  None.   FINDINGS: No acute bony or joint abnormality. No evidence of fracture. Degenerative changes lumbar spine and both hips.  IMPRESSION: No acute  abnormality.   Electronically Signed   By: Marcello Moores  Register   On: 01/27/2014 15:38   Ct Head Wo Contrast  01/27/2014   CLINICAL DATA:  [place] 2 days ago with back pain  EXAM: CT HEAD WITHOUT CONTRAST  TECHNIQUE: Contiguous axial images were obtained from the base of the skull through the vertex without intravenous contrast.  COMPARISON:  07/18/2007  FINDINGS: Mild age-related atrophy. No hemorrhage, extra-axial fluid, infarct, mass, or hydrocephalus. No skull fracture.  IMPRESSION: No acute findings   Electronically Signed   By: Skipper Cliche M.D.   On: 01/27/2014 15:43   Ct Angio Chest Aorta W/cm &/or Wo/cm  01/27/2014   CLINICAL DATA:  Fall Sunday. Back pain, chest pain. Question dissection.  EXAM: CT ANGIOGRAPHY CHEST, ABDOMEN AND PELVIS  TECHNIQUE: Multidetector CT imaging through the chest, abdomen and pelvis was performed using the standard protocol during bolus administration of intravenous contrast. Multiplanar reconstructed images and MIPs were obtained and reviewed to evaluate the vascular anatomy.  CONTRAST:  122mL OMNIPAQUE IOHEXOL 350 MG/ML SOLN  COMPARISON:  01/15/2014  FINDINGS: CTA CHEST FINDINGS  Heart is normal size. Aorta is normal caliber. Scattered aortic arch calcifications. No dissection.  No filling defects in the pulmonary arteries to suggest pulmonary emboli.  Lungs are clear. No focal airspace opacities or suspicious nodules. No effusions. No mediastinal, hilar, or axillary adenopathy. Chest wall soft tissues are unremarkable.  Review of the MIP images confirms the above findings.  CTA ABDOMEN AND PELVIS FINDINGS  Aorta is normal caliber. Scattered infrarenal aortic calcifications. No aneurysm or dissection. Mesenteric and renal arteries are widely patent.  Liver, spleen, pancreas, adrenals and kidneys are unremarkable. Gallbladder, stomach,  large and small bowel grossly unremarkable. Appendix is visualized and is normal. Small retroperitoneal lymph nodes, none pathologically enlarged.  Prior hysterectomy. No adnexal masses. Urinary bladder is unremarkable.  No acute bony abnormality or focal bone lesion.  Review of the MIP images confirms the above findings.  IMPRESSION: No evidence of aortic dissection or aneurysm.  No evidence of pulmonary embolus.  No acute findings in the chest, abdomen or pelvis.   Electronically Signed   By: Rolm Baptise M.D.   On: 01/27/2014 17:15   Ct Angio Abd/pel W/ And/or W/o  01/27/2014   CLINICAL DATA:  Fall Sunday. Back pain, chest pain. Question dissection.  EXAM: CT ANGIOGRAPHY CHEST, ABDOMEN AND PELVIS  TECHNIQUE: Multidetector CT imaging through the chest, abdomen and pelvis was performed using the standard protocol during bolus administration of intravenous contrast. Multiplanar reconstructed images and MIPs were obtained and reviewed to evaluate the vascular anatomy.  CONTRAST:  134mL OMNIPAQUE IOHEXOL 350 MG/ML SOLN  COMPARISON:  01/15/2014  FINDINGS: CTA CHEST FINDINGS  Heart is normal size. Aorta is normal caliber. Scattered aortic arch calcifications. No dissection.  No filling defects in the pulmonary arteries to suggest pulmonary emboli.  Lungs are clear. No focal airspace opacities or suspicious nodules. No effusions. No mediastinal, hilar, or axillary adenopathy. Chest wall soft tissues are unremarkable.  Review of the MIP images confirms the above findings.  CTA ABDOMEN AND PELVIS FINDINGS  Aorta is normal caliber. Scattered infrarenal aortic calcifications. No aneurysm or dissection. Mesenteric and renal arteries are widely patent.  Liver, spleen, pancreas, adrenals and kidneys are unremarkable. Gallbladder, stomach, large and small bowel grossly unremarkable. Appendix is visualized and is normal. Small retroperitoneal lymph nodes, none pathologically enlarged.  Prior hysterectomy. No adnexal masses.  Urinary bladder is unremarkable.  No acute bony abnormality or focal  bone lesion.  Review of the MIP images confirms the above findings.  IMPRESSION: No evidence of aortic dissection or aneurysm.  No evidence of pulmonary embolus.  No acute findings in the chest, abdomen or pelvis.   Electronically Signed   By: Rolm Baptise M.D.   On: 01/27/2014 17:15     EKG Interpretation   Date/Time:  Tuesday January 27 2014 13:54:30 EDT Ventricular Rate:  78 PR Interval:  172 QRS Duration: 80 QT Interval:  386 QTC Calculation: 440 R Axis:   59 Text Interpretation:  Sinus rhythm Borderline T wave abnormalities  Artifact No significant change was found Confirmed by Wyvonnia Dusky  MD, Dameka Younker  [phone]) on 01/27/2014 2:02:42 PM      MDM   Final diagnoses:  Tailbone injury, initial encounter  Chest wall pain   Mechanical fall 2 nights ago hitting head and back. Questionable loss of consciousness. Ongoing back pain without focal deficit. Also complains of right-sided chest pain has been constant since the fall.  Patient with pain in her tailbone. No step-off or deformity. Neurologically intact with equal strength bilaterally. Imaging shows likely nondisplaced tip of sacrum fracture. No evidence of aortic dissection.  Chest pain is constant and right-sided and reproducible. Low suspicion for ACS EKG changes. Troponin negative x2.  Patient is able to ambulate without assistance. Pain controlled in the ED. Return precautions discussed. Stable for outpatient followup with pain control.   Ezequiel Essex, MD 01/27/14 2157

## 2014-01-27 NOTE — ED Notes (Signed)
Pt states she fell last night and has had back pain since that time.

## 2014-02-02 ENCOUNTER — Other Ambulatory Visit (HOSPITAL_COMMUNITY): Payer: Self-pay | Admitting: Psychiatry

## 2014-02-03 ENCOUNTER — Telehealth (HOSPITAL_COMMUNITY): Payer: Self-pay | Admitting: *Deleted

## 2014-02-03 MED ORDER — PALIPERIDONE ER 9 MG PO TB24
9.0000 mg | ORAL_TABLET | Freq: Every day | ORAL | Status: DC
Start: 2014-02-03 — End: 2014-03-06

## 2014-02-03 MED ORDER — VORTIOXETINE HBR 10 MG PO TABS: 1.0000 | ORAL_TABLET | Freq: Every day | ORAL | Status: DC

## 2014-02-03 MED ORDER — LAMOTRIGINE 100 MG PO TABS: 100.0000 mg | ORAL_TABLET | Freq: Two times a day (BID) | ORAL | Status: DC

## 2014-02-03 NOTE — Telephone Encounter (Signed)
30 day supply of RX given with note to make appt

## 2014-02-09 ENCOUNTER — Other Ambulatory Visit (HOSPITAL_COMMUNITY): Payer: Self-pay | Admitting: Psychiatry

## 2014-02-17 ENCOUNTER — Ambulatory Visit: Payer: PRIVATE HEALTH INSURANCE | Admitting: Family Medicine

## 2014-02-26 ENCOUNTER — Ambulatory Visit: Payer: PRIVATE HEALTH INSURANCE | Admitting: Family Medicine

## 2014-03-03 ENCOUNTER — Ambulatory Visit (INDEPENDENT_AMBULATORY_CARE_PROVIDER_SITE_OTHER): Payer: PRIVATE HEALTH INSURANCE | Admitting: Family Medicine

## 2014-03-03 ENCOUNTER — Encounter: Payer: Self-pay | Admitting: Family Medicine

## 2014-03-03 VITALS — BP 116/78 | Ht 70.5 in | Wt 223.0 lb

## 2014-03-03 DIAGNOSIS — E119 Type 2 diabetes mellitus without complications: Secondary | ICD-10-CM

## 2014-03-03 DIAGNOSIS — E785 Hyperlipidemia, unspecified: Secondary | ICD-10-CM

## 2014-03-03 DIAGNOSIS — S300XXS Contusion of lower back and pelvis, sequela: Secondary | ICD-10-CM

## 2014-03-03 DIAGNOSIS — Z23 Encounter for immunization: Secondary | ICD-10-CM

## 2014-03-03 LAB — POCT GLYCOSYLATED HEMOGLOBIN (HGB A1C): HEMOGLOBIN A1C: 5.3

## 2014-03-03 NOTE — Progress Notes (Signed)
   Subjective:    Patient ID: [name], female    DOB: Jan 08, 1961, 53 y.o.   MRN: [id number]  Diabetes She presents for her follow-up diabetic visit. She has type 2 diabetes mellitus. Pertinent negatives for hypoglycemia include no headaches. Pertinent negatives for diabetes include no chest pain, no fatigue and no weakness. When asked about current treatments, none were reported. She is compliant with treatment all of the time. She is following a diabetic diet. She rarely participates in exercise. Her breakfast blood glucose range is generally >200 mg/dl. She sees a podiatrist.Eye exam is not current.  A1C 5.3  Fell last month. Went to [facility]. Having back pain and headaches since fall. I reviewed her ER records with  Significant time spent counseling patient quit smoking  Bone spurs in both feet.    Review of Systems  Constitutional: Negative for activity change, appetite change and fatigue.  HENT: Negative for congestion, ear discharge and rhinorrhea.   Eyes: Negative for discharge.  Respiratory: Negative for cough, chest tightness and wheezing.   Cardiovascular: Negative for chest pain.  Gastrointestinal: Negative for vomiting and abdominal pain.  Genitourinary: Negative for frequency and difficulty urinating.  Musculoskeletal: Negative for neck pain.  Allergic/Immunologic: Negative for environmental allergies and food allergies.  Neurological: Negative for weakness and headaches.  Psychiatric/Behavioral: Negative for behavioral problems and agitation.       Objective:   Physical Exam  Vitals reviewed. Constitutional: She appears well-nourished. No distress.  Cardiovascular: Normal rate, regular rhythm and normal heart sounds.   No murmur heard. Pulmonary/Chest: Effort normal and breath sounds normal. No respiratory distress.  Abdominal: Soft. There is no tenderness.  Musculoskeletal: She exhibits no edema.  Lymphadenopathy:    She has no cervical adenopathy.    Neurological: She is alert. She exhibits normal muscle tone.  Psychiatric: Her behavior is normal.      Diabetic foot exam was completed please see details    Assessment & Plan:  Diabetes excellent control watch diet closely.  She has significant changes in her feet she should qualify for diabetic shoes  Fractured tailbone will take some time but it should get better  History hyperlipidemia check lipid profile await results and your micro-protein wait the results. Followup 3 months to 4 months.  Patient has ongoing cough. Her last scan did not show any cancer they recommend a repeat scan in one year she was strongly encouraged to quit smoking it is unlikely she will do so  25 minutes followup 3 months

## 2014-03-04 ENCOUNTER — Other Ambulatory Visit (HOSPITAL_COMMUNITY): Payer: Self-pay | Admitting: Psychiatry

## 2014-03-06 ENCOUNTER — Other Ambulatory Visit (HOSPITAL_COMMUNITY): Payer: Self-pay | Admitting: *Deleted

## 2014-03-06 ENCOUNTER — Telehealth (HOSPITAL_COMMUNITY): Payer: Self-pay | Admitting: *Deleted

## 2014-03-06 MED ORDER — VORTIOXETINE HBR 10 MG PO TABS: 1.0000 | ORAL_TABLET | Freq: Every day | ORAL | Status: DC

## 2014-03-06 MED ORDER — PALIPERIDONE ER 9 MG PO TB24: 9.0000 mg | ORAL_TABLET | Freq: Every day | ORAL | Status: DC

## 2014-03-06 MED ORDER — LAMOTRIGINE 100 MG PO TABS: 100.0000 mg | ORAL_TABLET | Freq: Two times a day (BID) | ORAL | Status: DC

## 2014-03-06 MED ORDER — BUPROPION HCL ER (SR) 150 MG PO TB12: 150.0000 mg | ORAL_TABLET | Freq: Two times a day (BID) | ORAL | Status: DC

## 2014-03-06 NOTE — Telephone Encounter (Signed)
Per Dr. Harrington Challenger to fill pt med for 30 days with no refills. Fax Received. Refill Completed. Chaniya Genter Chowoe (R.M.A)

## 2014-03-06 NOTE — Telephone Encounter (Signed)
Pt pharmacy is requesting refills for pt. Pt had not been seen since 10-14-13. Called pt to make an appt. Made upcoming appt  for Sept 9, 2015. Informed pt that I have to talk to Dr. Harrington Challenger to see if she would like to fill pt  Brintellix, Bupropion SR, Invega and Lamotrigine until her appt and pt agreed.

## 2014-03-06 NOTE — Telephone Encounter (Signed)
meds can be filled until next appt

## 2014-03-18 ENCOUNTER — Ambulatory Visit (INDEPENDENT_AMBULATORY_CARE_PROVIDER_SITE_OTHER): Payer: Medicare Other | Admitting: Psychiatry

## 2014-03-18 ENCOUNTER — Encounter (HOSPITAL_COMMUNITY): Payer: Self-pay | Admitting: Psychiatry

## 2014-03-18 VITALS — BP 113/69 | HR 89 | Ht 70.5 in | Wt 227.2 lb

## 2014-03-18 DIAGNOSIS — F259 Schizoaffective disorder, unspecified: Secondary | ICD-10-CM

## 2014-03-18 DIAGNOSIS — F251 Schizoaffective disorder, depressive type: Secondary | ICD-10-CM

## 2014-03-18 MED ORDER — BUPROPION HCL ER (SR) 150 MG PO TB12: 150.0000 mg | ORAL_TABLET | Freq: Two times a day (BID) | ORAL | Status: DC

## 2014-03-18 MED ORDER — TRAZODONE HCL 100 MG PO TABS: 100.0000 mg | ORAL_TABLET | Freq: Every day | ORAL | Status: DC

## 2014-03-18 MED ORDER — PALIPERIDONE ER 9 MG PO TB24: 9.0000 mg | ORAL_TABLET | Freq: Every day | ORAL | Status: DC

## 2014-03-18 MED ORDER — LAMOTRIGINE 100 MG PO TABS: 100.0000 mg | ORAL_TABLET | Freq: Two times a day (BID) | ORAL | Status: DC
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
## 2014-04-02 ENCOUNTER — Other Ambulatory Visit: Payer: Self-pay | Admitting: Family Medicine

## 2014-04-02 ENCOUNTER — Other Ambulatory Visit (HOSPITAL_COMMUNITY): Payer: Self-pay | Admitting: Psychiatry

## 2014-04-06 ENCOUNTER — Telehealth (HOSPITAL_COMMUNITY): Payer: Self-pay | Admitting: *Deleted

## 2014-04-06 NOTE — Telephone Encounter (Signed)
Pt pharmacy requesting pt Brintellix. Per our records, pt was D/C from this medication 03-18-14. Called pharmacy and spoke with Minette Brine and per Lanny Hurst, he will D/C medication from their records

## 2014-05-06 ENCOUNTER — Telehealth: Payer: Self-pay | Admitting: Family Medicine

## 2014-05-06 ENCOUNTER — Other Ambulatory Visit: Payer: Self-pay | Admitting: *Deleted

## 2014-05-06 MED ORDER — ESOMEPRAZOLE MAGNESIUM 40 MG PO CPDR: 40.0000 mg | DELAYED_RELEASE_CAPSULE | Freq: Every day | ORAL | Status: DC

## 2014-05-06 NOTE — Telephone Encounter (Signed)
NEXIUM 40 MG capsule  [facility]  See chart for request

## 2014-05-06 NOTE — Telephone Encounter (Signed)
Yes - that would be fine

## 2014-05-06 NOTE — Telephone Encounter (Signed)
Med sent to pharm 

## 2014-05-06 NOTE — Telephone Encounter (Signed)
nexium will not be covered starting Jan 2016. Alternative on the 2016 list is esomeprazole magnesium. Can we switch med.

## 2014-05-15 ENCOUNTER — Ambulatory Visit (HOSPITAL_COMMUNITY): Payer: Medicare Other | Admitting: Psychiatry

## 2014-05-29 ENCOUNTER — Other Ambulatory Visit (HOSPITAL_COMMUNITY): Payer: Self-pay | Admitting: Psychiatry

## 2014-05-30 ENCOUNTER — Other Ambulatory Visit: Payer: Self-pay | Admitting: *Deleted

## 2014-05-30 MED ORDER — ESOMEPRAZOLE MAGNESIUM 40 MG PO CPDR: 40.0000 mg | DELAYED_RELEASE_CAPSULE | Freq: Every day | ORAL | Status: DC

## 2014-06-03 ENCOUNTER — Encounter (HOSPITAL_COMMUNITY): Payer: Self-pay | Admitting: Psychiatry

## 2014-06-03 ENCOUNTER — Ambulatory Visit (INDEPENDENT_AMBULATORY_CARE_PROVIDER_SITE_OTHER): Payer: Medicare Other | Admitting: Psychiatry

## 2014-06-03 VITALS — BP 120/76 | Ht 70.0 in | Wt 242.0 lb

## 2014-06-03 DIAGNOSIS — F259 Schizoaffective disorder, unspecified: Secondary | ICD-10-CM

## 2014-06-03 DIAGNOSIS — F251 Schizoaffective disorder, depressive type: Secondary | ICD-10-CM

## 2014-06-03 MED ORDER — BUPROPION HCL ER (SR) 150 MG PO TB12: 150.0000 mg | ORAL_TABLET | Freq: Two times a day (BID) | ORAL | Status: DC

## 2014-06-03 MED ORDER — PALIPERIDONE ER 9 MG PO TB24: 9.0000 mg | ORAL_TABLET | Freq: Every day | ORAL | Status: DC

## 2014-06-03 MED ORDER — LAMOTRIGINE 100 MG PO TABS: 100.0000 mg | ORAL_TABLET | Freq: Two times a day (BID) | ORAL | Status: DC

## 2014-06-03 MED ORDER — TRAZODONE HCL 100 MG PO TABS: 100.0000 mg | ORAL_TABLET | Freq: Every day | ORAL | Status: DC
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
## 2014-06-22 ENCOUNTER — Ambulatory Visit (INDEPENDENT_AMBULATORY_CARE_PROVIDER_SITE_OTHER): Payer: Medicare Other | Admitting: Family Medicine

## 2014-06-22 ENCOUNTER — Encounter: Payer: Self-pay | Admitting: Family Medicine

## 2014-06-22 VITALS — BP 120/68 | Ht 70.0 in | Wt 243.0 lb

## 2014-06-22 DIAGNOSIS — E119 Type 2 diabetes mellitus without complications: Secondary | ICD-10-CM

## 2014-06-22 DIAGNOSIS — E782 Mixed hyperlipidemia: Secondary | ICD-10-CM

## 2014-06-22 DIAGNOSIS — K219 Gastro-esophageal reflux disease without esophagitis: Secondary | ICD-10-CM

## 2014-06-22 LAB — POCT GLYCOSYLATED HEMOGLOBIN (HGB A1C): HEMOGLOBIN A1C: 6.1

## 2014-06-22 MED ORDER — METFORMIN HCL 500 MG PO TABS: ORAL_TABLET | ORAL | Status: DC

## 2014-06-22 NOTE — Progress Notes (Signed)
   Subjective:    Patient ID: [name], female    DOB: 06-14-1961, 53 y.o.   MRN: [id number]  Diabetes She presents for her follow-up diabetic visit. She has type 2 diabetes mellitus. Hypoglycemia symptoms include nervousness/anxiousness. Associated symptoms include fatigue and polyuria. She participates in exercise daily. She monitors blood glucose at home 1-2 x per week. Her highest blood glucose is >200 mg/dl. She sees a podiatrist.Eye exam is not current.   Needs FL2 form filled out She has history hyperlipidemia she also has history of diabetes peripheral edema. She also has mild mental retardation which gets in the way of her dietary selection and exercise habits.  Review of Systems  Constitutional: Positive for fatigue.  Endocrine: Positive for polyuria.  Psychiatric/Behavioral: The patient is nervous/anxious.       PMH benign Objective:   Physical Exam Patient has significant deformities in the feet along with dry skin and some mild neuropathy. Extremities trace edema lungs are clear no crackles heart is regular pulse normal  Blood pressure good Patient is compromise has mild mental retardation she does benefit from having an assistant helping her.     Assessment & Plan:  #1 this patient has diabetes A1c is worse than what it was. We will restart metformin at a low dose hopefully she will tolerate it we'll recheck A1c in approximately 3-4 months  #2history hyperlipidemia check lipid liver profile as previously ordered patient did not get this completed  #3 reflux issues under decent control with Nexium continue current measures  #4 for at least the second or third time within the past few weeks we will refill out FL 2 form. Hopefully we'll get to the right place this time

## 2014-06-30 ENCOUNTER — Other Ambulatory Visit: Payer: Self-pay | Admitting: Family Medicine

## 2014-07-20 DIAGNOSIS — E785 Hyperlipidemia, unspecified: Secondary | ICD-10-CM | POA: Diagnosis not present

## 2014-07-20 DIAGNOSIS — E119 Type 2 diabetes mellitus without complications: Secondary | ICD-10-CM | POA: Diagnosis not present

## 2014-07-20 LAB — LIPID PANEL
CHOL/HDL RATIO: 2.9 ratio
Cholesterol: 154 mg/dL (ref 0–200)
HDL: 53 mg/dL (ref >39)
LDL Cholesterol: 80 mg/dL (ref 0–99)
Triglycerides: 105 mg/dL (ref <150)
VLDL: 21 mg/dL (ref 0–40)

## 2014-07-21 LAB — MICROALBUMIN, URINE: MICROALB UR: 3.4 mg/dL — AB (ref <2.0)

## 2014-07-30 ENCOUNTER — Ambulatory Visit (HOSPITAL_COMMUNITY): Payer: MEDICAID | Admitting: Physical Therapy

## 2014-08-06 ENCOUNTER — Ambulatory Visit (HOSPITAL_COMMUNITY): Payer: MEDICAID | Admitting: Physical Therapy

## 2014-08-11 ENCOUNTER — Ambulatory Visit (HOSPITAL_COMMUNITY)
Admission: RE | Admit: 2014-08-11 | Discharge: 2014-08-11 | Disposition: A | Discharge status: Home / Self Care | Payer: Medicare Other | Source: Ambulatory Visit | Attending: Foot & Ankle Surgery | Admitting: Foot & Ankle Surgery

## 2014-08-11 DIAGNOSIS — E119 Type 2 diabetes mellitus without complications: Secondary | ICD-10-CM | POA: Insufficient documentation

## 2014-08-11 DIAGNOSIS — R2689 Other abnormalities of gait and mobility: Secondary | ICD-10-CM | POA: Diagnosis not present

## 2014-08-11 DIAGNOSIS — R531 Weakness: Secondary | ICD-10-CM | POA: Insufficient documentation

## 2014-08-11 DIAGNOSIS — M25672 Stiffness of left ankle, not elsewhere classified: Secondary | ICD-10-CM | POA: Diagnosis not present

## 2014-08-11 DIAGNOSIS — M25673 Stiffness of unspecified ankle, not elsewhere classified: Secondary | ICD-10-CM

## 2014-08-11 DIAGNOSIS — M25671 Stiffness of right ankle, not elsewhere classified: Secondary | ICD-10-CM | POA: Insufficient documentation

## 2014-08-11 DIAGNOSIS — M25571 Pain in right ankle and joints of right foot: Secondary | ICD-10-CM | POA: Diagnosis not present

## 2014-08-11 DIAGNOSIS — M25572 Pain in left ankle and joints of left foot: Secondary | ICD-10-CM | POA: Diagnosis not present

## 2014-08-11 DIAGNOSIS — I10 Essential (primary) hypertension: Secondary | ICD-10-CM | POA: Insufficient documentation

## 2014-08-11 DIAGNOSIS — M25579 Pain in unspecified ankle and joints of unspecified foot: Secondary | ICD-10-CM

## 2014-08-11 NOTE — Therapy (Signed)
[place], [place], [address] Phone: [phone]   Fax:  [phone]  Physical Therapy Evaluation  Patient Details  Name: [name] MRN: [id number] Date of Birth: 1961-05-10 Referring Provider:  Francee Piccolo, MD  Encounter Date: 08/11/2014      PT End of Session - 08/11/14 1625    Visit Number 1   Number of Visits 9   Date for PT Re-Evaluation 10/10/14   Authorization Type [employer] medicare   PT Start Time 1520   PT Stop Time 6270   PT Time Calculation (min) 55 min      Past Medical History  Diagnosis Date  . Mental retardation   . Hyperglycemia   . HTN (hypertension)   . Migraines   . Anxiety   . IBS (irritable bowel syndrome)   . Diabetes mellitus   . Lung nodules     right, followed by PCP, PET 11/2011  . MI (myocardial infarction)   . Diabetes mellitus, type II     Past Surgical History  Procedure Laterality Date  . Abdominal hysterectomy    . Foot surgery    . External ear surgery      bilateral  . Glaucoma surgery    . Colonoscopy  11/2007    hyperplastic polyps, prior hx of adenomas   . Small bowel capsule  10/2007    normal  . Esophagogastroduodenoscopy  08/2007    moderate sized hiatal hernia  . Colonoscopy  05/2010    incomplete due to poor prep, hyperplastic rectal polyp  . Esophagogastroduodenoscopy  05/2010    noncritical schatzki ring s/p 48F  . Colonoscopy  05/05/2002    Dimunitive polyps in the rectum and left colon, cold    biopsied/removed.  Scattered few left-sided diverticula.  Regular colonic   mucosa appeared normal  . Esophagogastroduodenoscopy (egd) with esophageal dilation N/A 02/06/2013    JJK:[id number] esophagus-s/p dilation up to a 2 [place] size with [facility] dilators.  Hiatal hernia  . Colonoscopy N/A 05/28/2013    Procedure: COLONOSCOPY;  Surgeon: Daneil Dolin, MD;  [facility];  Service: Endoscopy;  Laterality: N/A;  8:30    There were no vitals taken  for this visit.  Visit Diagnosis:  Stiffness of ankle joint, unspecified laterality  Pain in joint, ankle and foot, unspecified laterality  Poor balance      Subjective Assessment - 08/11/14 1538    Symptoms Ms. Waguespack states that her feet have been bothering her for a couple of months the Lt foot greater than the Rt.  She states that the pain is behind her heel and into her foot.  She has been having e-stim and stretching at another therapy facility in [place].  She has not been able tot continue this treatment due to cost factors now she is being referred to this facility for therapy.    How long can you stand comfortably? 30 minutes with quad cane (pt started using the quad cane due to pain)    How long can you walk comfortably? 40  minutes with quad cane. ( Pt started using quad cane due to pain)   Currently in Pain? Yes   Pain Score 8    Pain Location Ankle   Pain Orientation Right;Left   Pain Descriptors / Indicators Aching   Pain Type Chronic pain   Aggravating Factors  standing    Pain Relieving Factors rubbing  [facility] PT Assessment - 08/11/14 1544    Assessment   Medical Diagnosis achilles tendonitis B   Onset Date 05/11/14   Prior Therapy [facility] physcial therapy   Precautions   Precautions None   Restrictions   Weight Bearing Restrictions No   Balance Screen   Has the patient fallen in the past 6 months Yes   How many times? 3   Has the patient had a decrease in activity level because of a fear of falling?  Yes   Is the patient reluctant to leave their home because of a fear of falling?  No   Prior Function   Vocation On disability   AROM   Right Ankle Dorsiflexion -2   Right Ankle Plantar Flexion 65   Right Ankle Inversion 4   Right Ankle Eversion 10   Left Ankle Dorsiflexion -3   Left Ankle Plantar Flexion 55   Left Ankle Inversion 20   Left Ankle Eversion 2   Strength   Right Ankle Dorsiflexion 4+/5   Right Ankle Plantar Flexion 4+/5    Right Ankle Inversion 4+/5   Right Ankle Eversion 3/5   Left Ankle Dorsiflexion 4+/5   Left Ankle Plantar Flexion 4-/5   Left Ankle Inversion 4+/5   Left Ankle Eversion 4/5   Flexibility   Soft Tissue Assessment /Muscle Lenght yes   Palpation   Palpation Tight mm in gastroc B    Balance   Balance Assessed --  Single leg stance:  Rt= 4 seconds; Lt 4 seconds                   [facility] Adult PT Treatment/Exercise - 08/11/14 0001    Exercises   Exercises Ankle   Manual Therapy   Manual Therapy Massage   Massage friction massage to B achilles tendon; STM to mm belly   Ankle Exercises: Stretches   Gastroc Stretch 3 reps;30 seconds                  PT Short Term Goals - 08/11/14 1629    PT SHORT TERM GOAL #1   Title I HEP   Time 10   Period Days   PT SHORT TERM GOAL #2   Title Pain no greater than a 5/10   Time 10   Period Days   PT SHORT TERM GOAL #3   Title Pt ROM to be to 5 degree dorsiflextion to normalize ge   Time 10   Period Days   PT SHORT TERM GOAL #4   Title Pt to be able to SLS x 10 seconds B to reduce risk of falling   Time 10   Period Days           PT Long Term Goals - 08/11/14 1630    PT LONG TERM GOAL #1   Title I in advance HEP   Time 3   Period Weeks   PT LONG TERM GOAL #2   Title Pt pain to be decreased to no more than 3/10 80% of the day   Time 3   Period Weeks   PT LONG TERM GOAL #3   Title Pt to be walking without quad cane   Time 3   Period Weeks   PT LONG TERM GOAL #4   Title Pt to be able to SLS for 15 seconds B to reduce risk of falling   Time 3   Period Weeks  Plan - 2014-08-28 1625    Clinical Impression Statement Pt is a 54 yo female with B achilles tendonitis referred to skilled physical therapy.  Evaluation demonstrates major deficit of ROM, pain  and balance with minor of strength.  Pt will benefit from skilled PT for there ex, balance exercises , manual therapy and gait training.     Pt  will benefit from skilled therapeutic intervention in order to improve on the following deficits Decreased balance;Pain;Increased muscle spasms;Decreased strength;Impaired flexibility;Decreased range of motion   PT Frequency 3x / week   PT Duration 3 weeks   PT Treatment/Interventions Therapeutic activities;Therapeutic exercise;Balance training;Gait training;Patient/family education;Manual techniques   PT Next Visit Plan begin heel toe gait training, plantar fascia stretching; BAPs, SLS; toe raises (no heel raises), continue with manual    PT Home Exercise Plan given          G-Codes - 08/28/2014 1640    Functional Assessment Tool Used clinical judgement   Functional Limitation Mobility: Walking and moving around   Mobility: Walking and Moving Around Current Status (S9702) At least 40 percent but less than 60 percent impaired, limited or restricted   Mobility: Walking and Moving Around Goal Status [phone]) At least 20 percent but less than 40 percent impaired, limited or restricted       Problem List Patient Active Problem List   Diagnosis Date Noted  . Rectal bleeding 04/07/2013  . Melena 03/04/2013  . Hematemesis 03/04/2013  . Abdominal pain, epigastric 03/04/2013  . Hypokalemia 03/04/2013  . Smoker 01/16/2012  . Diarrhea 12/26/2011  . Pulmonary nodule, right 12/26/2011  . Fatty liver 08/24/2011  . Constipation 10/20/2010  . NAUSEA AND VOMITING 06/09/2010  . HEMATOCHEZIA 04/21/2010  . OTHER DYSPHAGIA 04/21/2010  . MILD MENTAL RETARDATION 02/17/2009  . GLAUCOMA 02/17/2009  . DM 01/15/2009  . OBESITY 01/15/2009  . ANXIETY 01/15/2009  . UNSPECIFIED MENTAL RETARDATION 01/15/2009  . MIGRAINE HEADACHE 01/15/2009  . HYPERTENSION 01/15/2009  . HEMORRHOIDS 01/15/2009  . GASTROESOPHAGEAL REFLUX DISEASE, CHRONIC 01/15/2009  . CONSTIPATION, CHRONIC 01/15/2009  . IBS 01/15/2009  . KNEE PAIN, CHRONIC 01/15/2009  . BACK PAIN, CHRONIC 01/15/2009  . COLONIC POLYPS, ADENOMATOUS, HX OF  01/15/2009    RUSSELL,CINDY PT 2014-08-28, 4:40 PM  [place] [address] [place], [place], [address] Phone: [phone]   Fax:  [phone]

## 2014-08-11 NOTE — Patient Instructions (Signed)
Gastroc Stretch   Stand with right foot back, leg straight, forward leg bent. Keeping heel on floor, turned slightly out, lean into wall until stretch is felt in calf. Hold __30__ seconds. Repeat __3__ times per set. Do __1__ sets per session. Do 3____ sessions per day.  http://orth.exer.us/26   Copyright  VHI. All rights reserved.

## 2014-08-13 ENCOUNTER — Ambulatory Visit (HOSPITAL_COMMUNITY)
Admission: RE | Admit: 2014-08-13 | Discharge: 2014-08-13 | Disposition: A | Discharge status: Home / Self Care | Payer: Medicare Other | Source: Ambulatory Visit | Attending: Foot & Ankle Surgery | Admitting: Foot & Ankle Surgery

## 2014-08-13 DIAGNOSIS — M25579 Pain in unspecified ankle and joints of unspecified foot: Secondary | ICD-10-CM

## 2014-08-13 DIAGNOSIS — R2689 Other abnormalities of gait and mobility: Secondary | ICD-10-CM | POA: Diagnosis not present

## 2014-08-13 DIAGNOSIS — E119 Type 2 diabetes mellitus without complications: Secondary | ICD-10-CM | POA: Diagnosis not present

## 2014-08-13 DIAGNOSIS — M25571 Pain in right ankle and joints of right foot: Secondary | ICD-10-CM | POA: Diagnosis not present

## 2014-08-13 DIAGNOSIS — R531 Weakness: Secondary | ICD-10-CM | POA: Diagnosis not present

## 2014-08-13 DIAGNOSIS — I10 Essential (primary) hypertension: Secondary | ICD-10-CM | POA: Diagnosis not present

## 2014-08-13 DIAGNOSIS — M25572 Pain in left ankle and joints of left foot: Secondary | ICD-10-CM | POA: Diagnosis not present

## 2014-08-13 DIAGNOSIS — M25671 Stiffness of right ankle, not elsewhere classified: Secondary | ICD-10-CM | POA: Diagnosis not present

## 2014-08-13 DIAGNOSIS — M25673 Stiffness of unspecified ankle, not elsewhere classified: Secondary | ICD-10-CM

## 2014-08-13 DIAGNOSIS — M25672 Stiffness of left ankle, not elsewhere classified: Secondary | ICD-10-CM | POA: Diagnosis not present

## 2014-08-13 NOTE — Therapy (Signed)
[place], [place], [address] Phone: [phone]   Fax:  [phone]  Physical Therapy Treatment  Patient Details  Name: [name] MRN: [id number] Date of Birth: 01/18/1961 Referring Provider:  Francee Piccolo, MD  Encounter Date: 08/13/2014      PT End of Session - 08/13/14 1902    Visit Number 2   Number of Visits 9   Date for PT Re-Evaluation 10/10/14   Authorization Type [employer] medicare   Authorization - Visit Number 2   Authorization - Number of Visits 10   PT Start Time 7035   PT Stop Time 1515   PT Time Calculation (min) 40 min   Activity Tolerance Patient limited by pain   Behavior During Therapy [facility] for tasks assessed/performed      Past Medical History  Diagnosis Date  . Mental retardation   . Hyperglycemia   . HTN (hypertension)   . Migraines   . Anxiety   . IBS (irritable bowel syndrome)   . Diabetes mellitus   . Lung nodules     right, followed by PCP, PET 11/2011  . MI (myocardial infarction)   . Diabetes mellitus, type II     Past Surgical History  Procedure Laterality Date  . Abdominal hysterectomy    . Foot surgery    . External ear surgery      bilateral  . Glaucoma surgery    . Colonoscopy  11/2007    hyperplastic polyps, prior hx of adenomas   . Small bowel capsule  10/2007    normal  . Esophagogastroduodenoscopy  08/2007    moderate sized hiatal hernia  . Colonoscopy  05/2010    incomplete due to poor prep, hyperplastic rectal polyp  . Esophagogastroduodenoscopy  05/2010    noncritical schatzki ring s/p 91F  . Colonoscopy  05/05/2002    Dimunitive polyps in the rectum and left colon, cold    biopsied/removed.  Scattered few left-sided diverticula.  Regular colonic   mucosa appeared normal  . Esophagogastroduodenoscopy (egd) with esophageal dilation N/A 02/06/2013    [id number] esophagus-s/p dilation up to a 37 [place] size with [facility] dilators.  Hiatal hernia  . Colonoscopy  N/A 05/28/2013    Procedure: COLONOSCOPY;  Surgeon: Daneil Dolin, MD;  [facility];  Service: Endoscopy;  Laterality: N/A;  8:30    There were no vitals taken for this visit.  Visit Diagnosis:  Stiffness of ankle joint, unspecified laterality  Pain in joint, ankle and foot, unspecified laterality  Poor balance      Subjective Assessment - 08/13/14 1858    Symptoms Pt comes today stating she is in alot of pain and nothing is helping.  States she has been doing her HEP.   Currently in Pain? Yes   Pain Score 8    Pain Location Foot   Pain Orientation Right;Left                    [facility] Adult PT Treatment/Exercise - 08/13/14 1859    Exercises   Exercises Ankle   Manual Therapy   Manual Therapy Massage   Massage B achilles tendons, plantar fascia in prone   Ankle Exercises: Stretches   Plantar Fascia Stretch 3 reps;30 seconds   Gastroc Stretch 3 reps;30 seconds   Ankle Exercises: Standing   SLS bilateral 5" max without UE assist   Toe Raise Limitations 10 reps   Other Standing Ankle Exercises gait training  100 feet without AD                  PT Short Term Goals - 08/11/14 1629    PT SHORT TERM GOAL #1   Title I HEP   Time 10   Period Days   PT SHORT TERM GOAL #2   Title Pain no greater than a 5/10   Time 10   Period Days   PT SHORT TERM GOAL #3   Title Pt ROM to be to 5 degree dorsiflextion to normalize ge   Time 10   Period Days   PT SHORT TERM GOAL #4   Title Pt to be able to SLS x 10 seconds B to reduce risk of falling   Time 10   Period Days           PT Long Term Goals - 08/11/14 1630    PT LONG TERM GOAL #1   Title I in advance HEP   Time 3   Period Weeks   PT LONG TERM GOAL #2   Title Pt pain to be decreased to no more than 3/10 80% of the day   Time 3   Period Weeks   PT LONG TERM GOAL #3   Title Pt to be walking without quad cane   Time 3   Period Weeks   PT LONG TERM GOAL #4   Title Pt to be able to  SLS for 15 seconds B to reduce risk of falling   Time 3   Period Weeks               Plan - 08/13/14 1903    Clinical Impression Statement Began new stretches and standing exericses per PT POC.  Pt able to complete with cues and without c/o pain. Contiued with manual for bilateral feet/achilles/gastrocs.  Pt reported increased pain following manual to 9/10, however appeared to be walking better than when arrived for therapy.     PT Next Visit Plan Add BAPs and continue to improve gait.  Consult with evaluating therapist if pain continues to increase at end of session.  Inquire on HEP to ensure patient is completing.    PT Home Exercise Plan given        Problem List Patient Active Problem List   Diagnosis Date Noted  . Rectal bleeding 04/07/2013  . Melena 03/04/2013  . Hematemesis 03/04/2013  . Abdominal pain, epigastric 03/04/2013  . Hypokalemia 03/04/2013  . Smoker 01/16/2012  . Diarrhea 12/26/2011  . Pulmonary nodule, right 12/26/2011  . Fatty liver 08/24/2011  . Constipation 10/20/2010  . NAUSEA AND VOMITING 06/09/2010  . HEMATOCHEZIA 04/21/2010  . OTHER DYSPHAGIA 04/21/2010  . MILD MENTAL RETARDATION 02/17/2009  . GLAUCOMA 02/17/2009  . DM 01/15/2009  . OBESITY 01/15/2009  . ANXIETY 01/15/2009  . UNSPECIFIED MENTAL RETARDATION 01/15/2009  . MIGRAINE HEADACHE 01/15/2009  . HYPERTENSION 01/15/2009  . HEMORRHOIDS 01/15/2009  . GASTROESOPHAGEAL REFLUX DISEASE, CHRONIC 01/15/2009  . CONSTIPATION, CHRONIC 01/15/2009  . IBS 01/15/2009  . KNEE PAIN, CHRONIC 01/15/2009  . BACK PAIN, CHRONIC 01/15/2009  . COLONIC POLYPS, ADENOMATOUS, HX OF 01/15/2009    Teena Irani, PTA/CLT [phone] 08/13/2014, 7:17 PM  [place] [address] [place], [place], [address] Phone: [phone]   Fax:  [phone]

## 2014-08-18 ENCOUNTER — Other Ambulatory Visit: Payer: Self-pay | Admitting: Family Medicine

## 2014-08-18 ENCOUNTER — Ambulatory Visit (HOSPITAL_COMMUNITY)
Admission: RE | Admit: 2014-08-18 | Discharge: 2014-08-18 | Disposition: A | Discharge status: Home / Self Care | Payer: Medicare Other | Source: Ambulatory Visit | Attending: Foot & Ankle Surgery | Admitting: Foot & Ankle Surgery

## 2014-08-18 ENCOUNTER — Other Ambulatory Visit: Payer: Self-pay | Admitting: Emergency Medicine

## 2014-08-18 DIAGNOSIS — R531 Weakness: Secondary | ICD-10-CM | POA: Diagnosis not present

## 2014-08-18 DIAGNOSIS — E119 Type 2 diabetes mellitus without complications: Secondary | ICD-10-CM | POA: Diagnosis not present

## 2014-08-18 DIAGNOSIS — M25572 Pain in left ankle and joints of left foot: Secondary | ICD-10-CM | POA: Diagnosis not present

## 2014-08-18 DIAGNOSIS — M25671 Stiffness of right ankle, not elsewhere classified: Secondary | ICD-10-CM | POA: Diagnosis not present

## 2014-08-18 DIAGNOSIS — R2689 Other abnormalities of gait and mobility: Secondary | ICD-10-CM

## 2014-08-18 DIAGNOSIS — Z1231 Encounter for screening mammogram for malignant neoplasm of breast: Secondary | ICD-10-CM

## 2014-08-18 DIAGNOSIS — M25673 Stiffness of unspecified ankle, not elsewhere classified: Secondary | ICD-10-CM

## 2014-08-18 DIAGNOSIS — I10 Essential (primary) hypertension: Secondary | ICD-10-CM | POA: Diagnosis not present

## 2014-08-18 DIAGNOSIS — M25672 Stiffness of left ankle, not elsewhere classified: Secondary | ICD-10-CM | POA: Diagnosis not present

## 2014-08-18 DIAGNOSIS — M25579 Pain in unspecified ankle and joints of unspecified foot: Secondary | ICD-10-CM

## 2014-08-18 DIAGNOSIS — M25571 Pain in right ankle and joints of right foot: Secondary | ICD-10-CM | POA: Diagnosis not present

## 2014-08-18 NOTE — Therapy (Signed)
[place], [place], [address] Phone: [phone]   Fax:  [phone]  Physical Therapy Treatment  Patient Details  Name: [name] MRN: [id number] Date of Birth: 1960-07-29 Referring Provider:  Francee Piccolo, MD  Encounter Date: 08/18/2014      PT End of Session - 08/18/14 1609    Visit Number 3   Number of Visits 9   Date for PT Re-Evaluation 10/10/14   Authorization Type [employer] medicare   Authorization - Visit Number 3   Authorization - Number of Visits 10   PT Start Time 9381   PT Stop Time 1656   PT Time Calculation (min) 51 min   Activity Tolerance Patient limited by pain   Behavior During Therapy [facility] for tasks assessed/performed      Past Medical History  Diagnosis Date  . Mental retardation   . Hyperglycemia   . HTN (hypertension)   . Migraines   . Anxiety   . IBS (irritable bowel syndrome)   . Diabetes mellitus   . Lung nodules     right, followed by PCP, PET 11/2011  . MI (myocardial infarction)   . Diabetes mellitus, type II     Past Surgical History  Procedure Laterality Date  . Abdominal hysterectomy    . Foot surgery    . External ear surgery      bilateral  . Glaucoma surgery    . Colonoscopy  11/2007    hyperplastic polyps, prior hx of adenomas   . Small bowel capsule  10/2007    normal  . Esophagogastroduodenoscopy  08/2007    moderate sized hiatal hernia  . Colonoscopy  05/2010    incomplete due to poor prep, hyperplastic rectal polyp  . Esophagogastroduodenoscopy  05/2010    noncritical schatzki ring s/p 2F  . Colonoscopy  05/05/2002    Dimunitive polyps in the rectum and left colon, cold    biopsied/removed.  Scattered few left-sided diverticula.  Regular colonic   mucosa appeared normal  . Esophagogastroduodenoscopy (egd) with esophageal dilation N/A 02/06/2013    [id number] esophagus-s/p dilation up to a 47 [place] size with [facility] dilators.  Hiatal hernia  . Colonoscopy  N/A 05/28/2013    Procedure: COLONOSCOPY;  Surgeon: Daneil Dolin, MD;  [facility];  Service: Endoscopy;  Laterality: N/A;  8:30    There were no vitals taken for this visit.  Visit Diagnosis:  Stiffness of ankle joint, unspecified laterality  Pain in joint, ankle and foot, unspecified laterality  Poor balance      Subjective Assessment - 08/18/14 1608    Symptoms Pain scale 8 07/11/08 Bil feet today, reports compliance with HEP 3x daily   Currently in Pain? Yes   Pain Score 8    Pain Location Foot   Pain Orientation Right;Left                    [facility] Adult PT Treatment/Exercise - 08/18/14 0001    Exercises   Exercises Ankle   Modalities   Modalities Cryotherapy   Cryotherapy   Number Minutes Cryotherapy 10 Minutes   Cryotherapy Location Ankle   Type of Cryotherapy Ice pack   Manual Therapy   Manual Therapy Massage   Massage friction massage to Bil achilles tendon, STM to gastroc/soleus complex   Ankle Exercises: Stretches   Plantar Fascia Stretch 3 reps;30 seconds   Gastroc Stretch 3 reps;30 seconds   Gastroc Stretch Limitations slant  board   Ankle Exercises: Standing   Heel Raises 10 reps   Toe Raise Limitations 10 reps   Other Standing Ankle Exercises gait training x 226 feet   Ankle Exercises: Seated   BAPS Sitting;Level 3;5 reps   BAPS Limitations A/P, R/L, CW/CCW                  PT Short Term Goals - 08/18/14 1651    PT SHORT TERM GOAL #1   Title I HEP   Status On-going   PT SHORT TERM GOAL #2   Title Pain no greater than a 5/10   PT SHORT TERM GOAL #3   Title Pt ROM to be to 5 degree dorsiflextion to normalize ge   Status On-going   PT SHORT TERM GOAL #4   Title Pt to be able to SLS x 10 seconds B to reduce risk of falling           PT Long Term Goals - 08/18/14 1651    PT LONG TERM GOAL #1   Title I in advance HEP   PT LONG TERM GOAL #2   Title Pt pain to be decreased to no more than 3/10 80% of the day    PT LONG TERM GOAL #3   Title Pt to be walking without quad cane   PT LONG TERM GOAL #4   Title Pt to be able to SLS for 15 seconds B to reduce risk of falling               Plan - 08/18/14 1613    Clinical Impression Statement Continues with ankle stretches and standing strengtheing exercises per PT POC.  Added seated BAPS to improve ROM and coordination with mod cueing for technique.  Manual techniques complete to reduce fascial restrictions for bilateral plantar surface/ achillies and gastroc/soleus complex, pt reported increased pain following manual but does demonstrate improve gait mechaiics following cueing.  Added ice at end of session for pain control with reports of pain reduced at end of session.   PT Next Visit Plan Continue with BAPs and cueing to improve gait.  Progress LE strenghtening to improve balance as able.  Consult with evaluating therapist if pain continues to increase at end of session.  Inquire on HEP to ensure patient is completing.         Problem List Patient Active Problem List   Diagnosis Date Noted  . Rectal bleeding 04/07/2013  . Melena 03/04/2013  . Hematemesis 03/04/2013  . Abdominal pain, epigastric 03/04/2013  . Hypokalemia 03/04/2013  . Smoker 01/16/2012  . Diarrhea 12/26/2011  . Pulmonary nodule, right 12/26/2011  . Fatty liver 08/24/2011  . Constipation 10/20/2010  . NAUSEA AND VOMITING 06/09/2010  . HEMATOCHEZIA 04/21/2010  . OTHER DYSPHAGIA 04/21/2010  . MILD MENTAL RETARDATION 02/17/2009  . GLAUCOMA 02/17/2009  . DM 01/15/2009  . OBESITY 01/15/2009  . ANXIETY 01/15/2009  . UNSPECIFIED MENTAL RETARDATION 01/15/2009  . MIGRAINE HEADACHE 01/15/2009  . HYPERTENSION 01/15/2009  . HEMORRHOIDS 01/15/2009  . GASTROESOPHAGEAL REFLUX DISEASE, CHRONIC 01/15/2009  . CONSTIPATION, CHRONIC 01/15/2009  . IBS 01/15/2009  . KNEE PAIN, CHRONIC 01/15/2009  . BACK PAIN, CHRONIC 01/15/2009  . COLONIC POLYPS, ADENOMATOUS, HX OF 01/15/2009    Ihor Austin, [place]   Aldona Lento 08/18/2014, 5:00 PM  [place] [address] [place], [place], [address] Phone: [phone]   Fax:  [phone]

## 2014-08-19 ENCOUNTER — Ambulatory Visit (HOSPITAL_COMMUNITY)
Admission: RE | Admit: 2014-08-19 | Discharge: 2014-08-19 | Disposition: A | Discharge status: Home / Self Care | Payer: Medicare Other | Source: Ambulatory Visit | Attending: Foot & Ankle Surgery | Admitting: Foot & Ankle Surgery

## 2014-08-19 DIAGNOSIS — I10 Essential (primary) hypertension: Secondary | ICD-10-CM | POA: Diagnosis not present

## 2014-08-19 DIAGNOSIS — R531 Weakness: Secondary | ICD-10-CM | POA: Diagnosis not present

## 2014-08-19 DIAGNOSIS — E119 Type 2 diabetes mellitus without complications: Secondary | ICD-10-CM | POA: Diagnosis not present

## 2014-08-19 DIAGNOSIS — M25672 Stiffness of left ankle, not elsewhere classified: Secondary | ICD-10-CM | POA: Diagnosis not present

## 2014-08-19 DIAGNOSIS — R2689 Other abnormalities of gait and mobility: Secondary | ICD-10-CM | POA: Diagnosis not present

## 2014-08-19 DIAGNOSIS — M25571 Pain in right ankle and joints of right foot: Secondary | ICD-10-CM | POA: Diagnosis not present

## 2014-08-19 DIAGNOSIS — M25579 Pain in unspecified ankle and joints of unspecified foot: Secondary | ICD-10-CM

## 2014-08-19 DIAGNOSIS — M25673 Stiffness of unspecified ankle, not elsewhere classified: Secondary | ICD-10-CM

## 2014-08-19 DIAGNOSIS — M25671 Stiffness of right ankle, not elsewhere classified: Secondary | ICD-10-CM | POA: Diagnosis not present

## 2014-08-19 DIAGNOSIS — M25572 Pain in left ankle and joints of left foot: Secondary | ICD-10-CM | POA: Diagnosis not present

## 2014-08-19 NOTE — Therapy (Signed)
[place], [place], [address] Phone: [phone]   Fax:  [phone]  Physical Therapy Treatment  Patient Details  Name: [name] MRN: [id number] Date of Birth: 02/02/1961 Referring Provider:  Francee Piccolo, MD  Encounter Date: 08/19/2014      PT End of Session - 08/19/14 1347    Visit Number 4   Number of Visits 9   Date for PT Re-Evaluation 10/10/14   Authorization Type [employer] medicare   Authorization - Visit Number 4   Authorization - Number of Visits 10   PT Start Time 5638   PT Stop Time 1352   PT Time Calculation (min) 48 min   Activity Tolerance Patient limited by pain   Behavior During Therapy [facility] for tasks assessed/performed      Past Medical History  Diagnosis Date  . Mental retardation   . Hyperglycemia   . HTN (hypertension)   . Migraines   . Anxiety   . IBS (irritable bowel syndrome)   . Diabetes mellitus   . Lung nodules     right, followed by PCP, PET 11/2011  . MI (myocardial infarction)   . Diabetes mellitus, type II     Past Surgical History  Procedure Laterality Date  . Abdominal hysterectomy    . Foot surgery    . External ear surgery      bilateral  . Glaucoma surgery    . Colonoscopy  11/2007    hyperplastic polyps, prior hx of adenomas   . Small bowel capsule  10/2007    normal  . Esophagogastroduodenoscopy  08/2007    moderate sized hiatal hernia  . Colonoscopy  05/2010    incomplete due to poor prep, hyperplastic rectal polyp  . Esophagogastroduodenoscopy  05/2010    noncritical schatzki ring s/p 4F  . Colonoscopy  05/05/2002    Dimunitive polyps in the rectum and left colon, cold    biopsied/removed.  Scattered few left-sided diverticula.  Regular colonic   mucosa appeared normal  . Esophagogastroduodenoscopy (egd) with esophageal dilation N/A 02/06/2013    VFI:EPPIRJ esophagus-s/p dilation up to a 16 [place] size with [facility] dilators.  Hiatal hernia  . Colonoscopy  N/A 05/28/2013    Procedure: COLONOSCOPY;  Surgeon: Daneil Dolin, MD;  [facility];  Service: Endoscopy;  Laterality: N/A;  8:30    There were no vitals taken for this visit.  Visit Diagnosis:  Stiffness of ankle joint, unspecified laterality  Pain in joint, ankle and foot, unspecified laterality  Poor balance      Subjective Assessment - 08/19/14 1345    Symptoms Pt reports she is completing her HEP.  States pain has not decreased with 81/2 pain scale reported bilateral feet and ankles.     Currently in Pain? Yes   Pain Score 9    Pain Location Foot   Pain Orientation Right;Left                    [facility] Adult PT Treatment/Exercise - 08/19/14 1312    Modalities   Modalities Cryotherapy   Cryotherapy   Number Minutes Cryotherapy 10 Minutes   Cryotherapy Location Ankle   Type of Cryotherapy Ice pack   Ankle Exercises: Standing   Heel Raises 10 reps   Heel Raises Limitations Neutral, IR, ER   Toe Raise 10 reps   Toe Raise Limitations 10 reps   Other Standing Ankle Exercises gait training x 226 feet  Other Standing Ankle Exercises forward lunges onto 6" step, rockerboard A/P & R/L 10 reps   Ankle Exercises: Stretches   Plantar Fascia Stretch 3 reps;30 seconds   Gastroc Stretch 3 reps;30 seconds   Gastroc Stretch Limitations slant board   Ankle Exercises: Seated   BAPS Sitting;Level 3;5 reps   BAPS Limitations A/P, R/L, CW/CCW                  PT Short Term Goals - 08/18/14 1651    PT SHORT TERM GOAL #1   Title I HEP   Status On-going   PT SHORT TERM GOAL #2   Title Pain no greater than a 5/10   PT SHORT TERM GOAL #3   Title Pt ROM to be to 5 degree dorsiflextion to normalize ge   Status On-going   PT SHORT TERM GOAL #4   Title Pt to be able to SLS x 10 seconds B to reduce risk of falling           PT Long Term Goals - 08/18/14 1651    PT LONG TERM GOAL #1   Title I in advance HEP   PT LONG TERM GOAL #2   Title Pt pain  to be decreased to no more than 3/10 80% of the day   PT LONG TERM GOAL #3   Title Pt to be walking without quad cane   PT LONG TERM GOAL #4   Title Pt to be able to SLS for 15 seconds B to reduce risk of falling               Plan - 08/19/14 1342    Clinical Impression Statement Pt continues to report high pain however does not present with antalgia or difficulty with any task.  Pt does require max multimodal cues during therapy to complete exercises in correct form.  Manual techniques held this session to see if these are aggrevating pain as she typically reports increased pain following these.  Added rockerboard and forward lunges with cues but no pain reported.  Concluded with ice at end of session.   PT Next Visit Plan Continue with BAPs and cueing to improve gait.  Progress LE strenghtening to improve balance as able.  Consult with evaluating therapist if pain continues to increase at end of session.          Problem List Patient Active Problem List   Diagnosis Date Noted  . Rectal bleeding 04/07/2013  . Melena 03/04/2013  . Hematemesis 03/04/2013  . Abdominal pain, epigastric 03/04/2013  . Hypokalemia 03/04/2013  . Smoker 01/16/2012  . Diarrhea 12/26/2011  . Pulmonary nodule, right 12/26/2011  . Fatty liver 08/24/2011  . Constipation 10/20/2010  . NAUSEA AND VOMITING 06/09/2010  . HEMATOCHEZIA 04/21/2010  . OTHER DYSPHAGIA 04/21/2010  . MILD MENTAL RETARDATION 02/17/2009  . GLAUCOMA 02/17/2009  . DM 01/15/2009  . OBESITY 01/15/2009  . ANXIETY 01/15/2009  . UNSPECIFIED MENTAL RETARDATION 01/15/2009  . MIGRAINE HEADACHE 01/15/2009  . HYPERTENSION 01/15/2009  . HEMORRHOIDS 01/15/2009  . GASTROESOPHAGEAL REFLUX DISEASE, CHRONIC 01/15/2009  . CONSTIPATION, CHRONIC 01/15/2009  . IBS 01/15/2009  . KNEE PAIN, CHRONIC 01/15/2009  . BACK PAIN, CHRONIC 01/15/2009  . COLONIC POLYPS, ADENOMATOUS, HX OF 01/15/2009    Teena Irani, PTA/CLT [phone] 08/19/2014,  1:47 PM  [facility] [address] [place], [place], [address] Phone: [phone]   Fax:  [phone]

## 2014-08-26 ENCOUNTER — Encounter (HOSPITAL_COMMUNITY): Payer: Self-pay | Admitting: Physical Therapy

## 2014-08-28 ENCOUNTER — Ambulatory Visit (HOSPITAL_COMMUNITY): Payer: Medicare Other | Admitting: Physical Therapy

## 2014-08-28 DIAGNOSIS — I10 Essential (primary) hypertension: Secondary | ICD-10-CM | POA: Diagnosis not present

## 2014-08-28 DIAGNOSIS — M25672 Stiffness of left ankle, not elsewhere classified: Secondary | ICD-10-CM | POA: Diagnosis not present

## 2014-08-28 DIAGNOSIS — E119 Type 2 diabetes mellitus without complications: Secondary | ICD-10-CM | POA: Diagnosis not present

## 2014-08-28 DIAGNOSIS — M25673 Stiffness of unspecified ankle, not elsewhere classified: Secondary | ICD-10-CM

## 2014-08-28 DIAGNOSIS — M25572 Pain in left ankle and joints of left foot: Secondary | ICD-10-CM | POA: Diagnosis not present

## 2014-08-28 DIAGNOSIS — R2689 Other abnormalities of gait and mobility: Secondary | ICD-10-CM

## 2014-08-28 DIAGNOSIS — M25579 Pain in unspecified ankle and joints of unspecified foot: Secondary | ICD-10-CM

## 2014-08-28 DIAGNOSIS — M25671 Stiffness of right ankle, not elsewhere classified: Secondary | ICD-10-CM | POA: Diagnosis not present

## 2014-08-28 DIAGNOSIS — M25571 Pain in right ankle and joints of right foot: Secondary | ICD-10-CM | POA: Diagnosis not present

## 2014-08-28 DIAGNOSIS — R531 Weakness: Secondary | ICD-10-CM | POA: Diagnosis not present

## 2014-08-28 NOTE — Therapy (Signed)
[place], [place], [address] Phone: [phone]   Fax:  [phone]  Physical Therapy Treatment  Patient Details  Name: [name] MRN: [id number] Date of Birth: 01-17-1961 Referring Provider:  Francee Piccolo, MD  Encounter Date: 08/28/2014      PT End of Session - 08/28/14 1614    Visit Number 5   Number of Visits 9   Date for PT Re-Evaluation 10/10/14   Authorization Type [employer] medicare   Authorization - Visit Number 5   Authorization - Number of Visits 9   PT Start Time 1025   PT Stop Time 1108   PT Time Calculation (min) 43 min      Past Medical History  Diagnosis Date  . Mental retardation   . Hyperglycemia   . HTN (hypertension)   . Migraines   . Anxiety   . IBS (irritable bowel syndrome)   . Diabetes mellitus   . Lung nodules     right, followed by PCP, PET 11/2011  . MI (myocardial infarction)   . Diabetes mellitus, type II     Past Surgical History  Procedure Laterality Date  . Abdominal hysterectomy    . Foot surgery    . External ear surgery      bilateral  . Glaucoma surgery    . Colonoscopy  11/2007    hyperplastic polyps, prior hx of adenomas   . Small bowel capsule  10/2007    normal  . Esophagogastroduodenoscopy  08/2007    moderate sized hiatal hernia  . Colonoscopy  05/2010    incomplete due to poor prep, hyperplastic rectal polyp  . Esophagogastroduodenoscopy  05/2010    noncritical schatzki ring s/p 78F  . Colonoscopy  05/05/2002    Dimunitive polyps in the rectum and left colon, cold    biopsied/removed.  Scattered few left-sided diverticula.  Regular colonic   mucosa appeared normal  . Esophagogastroduodenoscopy (egd) with esophageal dilation N/A 02/06/2013    ONG:EXBMWU esophagus-s/p dilation up to a 34 [place] size with [facility] dilators.  Hiatal hernia  . Colonoscopy N/A 05/28/2013    Procedure: COLONOSCOPY;  Surgeon: Daneil Dolin, MD;  [facility];   Service: Endoscopy;  Laterality: N/A;  8:30    There were no vitals taken for this visit.  Visit Diagnosis:  Stiffness of ankle joint, unspecified laterality  Pain in joint, ankle and foot, unspecified laterality  Poor balance      Subjective Assessment - 08/28/14 1034    Symptoms Pt states pain is mainly in her Rt ankle now    Pain Score 10-Worst pain ever   Pain Orientation Right                    [facility] Adult PT Treatment/Exercise - 08/28/14 0001    Modalities   Modalities --   Cryotherapy   Cryotherapy Location --   Ankle Exercises: Standing   BAPS Standing;Level 2;10 reps   SLS Rt/ Lt x 20 seconds max x 5    Heel Raises --   Heel Raises Limitations --   Toe Raise 10 reps   Toe Raise Limitations 10 reps   Other Standing Ankle Exercises gait training  hold heel strike x 15 seconds; then foot flat opposite heel   Other Standing Ankle Exercises --   Ankle Exercises: Stretches   Plantar Fascia Stretch 3 reps;30 seconds   Gastroc Stretch 3 reps;30 seconds   Gastroc Stretch  Limitations slant board   Ankle Exercises: Seated   BAPS --   BAPS Limitations --                PT Education - 08/28/14 1618    Education provided Yes   Education Details proper heel toe gait with equal stride length   Person(s) Educated Patient   Methods Explanation;Demonstration   Comprehension Need further instruction          PT Short Term Goals - 08/18/14 1651    PT SHORT TERM GOAL #1   Title I HEP   Status On-going   PT SHORT TERM GOAL #2   Title Pain no greater than a 5/10   PT SHORT TERM GOAL #3   Title Pt ROM to be to 5 degree dorsiflextion to normalize ge   Status On-going   PT SHORT TERM GOAL #4   Title Pt to be able to SLS x 10 seconds B to reduce risk of falling           PT Long Term Goals - 08/18/14 1651    PT LONG TERM GOAL #1   Title I in advance HEP   PT LONG TERM GOAL #2   Title Pt pain to be decreased to no more than 3/10 80% of the  day   PT LONG TERM GOAL #3   Title Pt to be walking without quad cane   PT LONG TERM GOAL #4   Title Pt to be able to SLS for 15 seconds B to reduce risk of falling               Plan - 08/28/14 1615    Clinical Impression Statement Pt ambulates with flat feet treatement today focused on proper heel toe gait with pt needing multimodal cuing and will need continued instructin.  Pt needs significant cuing with all exercises but attempts to perform exercises incorrect form.  Increased Baps to standing position   Pt will benefit from skilled therapeutic intervention in order to improve on the following deficits Decreased balance;Pain;Increased muscle spasms;Decreased strength;Impaired flexibility;Decreased range of motion   PT Next Visit Plan may use manual techniques for stretching and mobs to decrease pain.         Problem List Patient Active Problem List   Diagnosis Date Noted  . Rectal bleeding 04/07/2013  . Melena 03/04/2013  . Hematemesis 03/04/2013  . Abdominal pain, epigastric 03/04/2013  . Hypokalemia 03/04/2013  . Smoker 01/16/2012  . Diarrhea 12/26/2011  . Pulmonary nodule, right 12/26/2011  . Fatty liver 08/24/2011  . Constipation 10/20/2010  . NAUSEA AND VOMITING 06/09/2010  . HEMATOCHEZIA 04/21/2010  . OTHER DYSPHAGIA 04/21/2010  . MILD MENTAL RETARDATION 02/17/2009  . GLAUCOMA 02/17/2009  . DM 01/15/2009  . OBESITY 01/15/2009  . ANXIETY 01/15/2009  . UNSPECIFIED MENTAL RETARDATION 01/15/2009  . MIGRAINE HEADACHE 01/15/2009  . HYPERTENSION 01/15/2009  . HEMORRHOIDS 01/15/2009  . GASTROESOPHAGEAL REFLUX DISEASE, CHRONIC 01/15/2009  . CONSTIPATION, CHRONIC 01/15/2009  . IBS 01/15/2009  . KNEE PAIN, CHRONIC 01/15/2009  . BACK PAIN, CHRONIC 01/15/2009  . COLONIC POLYPS, ADENOMATOUS, HX OF 01/15/2009    RUSSELL,CINDY PT 08/28/2014, 4:19 PM  [facility] [address] [place], [place], [address] Phone:  [phone]   Fax:  [phone]

## 2014-09-01 ENCOUNTER — Ambulatory Visit (HOSPITAL_COMMUNITY): Payer: Medicare Other

## 2014-09-01 DIAGNOSIS — M25571 Pain in right ankle and joints of right foot: Secondary | ICD-10-CM | POA: Diagnosis not present

## 2014-09-01 DIAGNOSIS — R2689 Other abnormalities of gait and mobility: Secondary | ICD-10-CM | POA: Diagnosis not present

## 2014-09-01 DIAGNOSIS — M25673 Stiffness of unspecified ankle, not elsewhere classified: Secondary | ICD-10-CM

## 2014-09-01 DIAGNOSIS — M25572 Pain in left ankle and joints of left foot: Secondary | ICD-10-CM | POA: Diagnosis not present

## 2014-09-01 DIAGNOSIS — M25671 Stiffness of right ankle, not elsewhere classified: Secondary | ICD-10-CM | POA: Diagnosis not present

## 2014-09-01 DIAGNOSIS — I10 Essential (primary) hypertension: Secondary | ICD-10-CM | POA: Diagnosis not present

## 2014-09-01 DIAGNOSIS — M25672 Stiffness of left ankle, not elsewhere classified: Secondary | ICD-10-CM | POA: Diagnosis not present

## 2014-09-01 DIAGNOSIS — E119 Type 2 diabetes mellitus without complications: Secondary | ICD-10-CM | POA: Diagnosis not present

## 2014-09-01 DIAGNOSIS — M25579 Pain in unspecified ankle and joints of unspecified foot: Secondary | ICD-10-CM

## 2014-09-01 DIAGNOSIS — R531 Weakness: Secondary | ICD-10-CM | POA: Diagnosis not present

## 2014-09-01 NOTE — Therapy (Signed)
[place], [place], [address] Phone: [phone]   Fax:  [phone]  Physical Therapy Treatment  Patient Details  Name: [name] MRN: [id number] Date of Birth: Sep 20, 1960 Referring Provider:  Francee Piccolo, MD  Encounter Date: 09/01/2014      PT End of Session - 09/01/14 1313    Visit Number 6   Number of Visits 9   Date for PT Re-Evaluation 10/10/14   Authorization Type [employer] medicare   Authorization - Visit Number 6   Authorization - Number of Visits 9   PT Start Time 5638   PT Stop Time 1355   PT Time Calculation (min) 51 min   Activity Tolerance Patient limited by pain   Behavior During Therapy [facility] for tasks assessed/performed      Past Medical History  Diagnosis Date  . Mental retardation   . Hyperglycemia   . HTN (hypertension)   . Migraines   . Anxiety   . IBS (irritable bowel syndrome)   . Diabetes mellitus   . Lung nodules     right, followed by PCP, PET 11/2011  . MI (myocardial infarction)   . Diabetes mellitus, type II     Past Surgical History  Procedure Laterality Date  . Abdominal hysterectomy    . Foot surgery    . External ear surgery      bilateral  . Glaucoma surgery    . Colonoscopy  11/2007    hyperplastic polyps, prior hx of adenomas   . Small bowel capsule  10/2007    normal  . Esophagogastroduodenoscopy  08/2007    moderate sized hiatal hernia  . Colonoscopy  05/2010    incomplete due to poor prep, hyperplastic rectal polyp  . Esophagogastroduodenoscopy  05/2010    noncritical schatzki ring s/p 38F  . Colonoscopy  05/05/2002    Dimunitive polyps in the rectum and left colon, cold    biopsied/removed.  Scattered few left-sided diverticula.  Regular colonic   mucosa appeared normal  . Esophagogastroduodenoscopy (egd) with esophageal dilation N/A 02/06/2013    VFI:EPPIRJ esophagus-s/p dilation up to a 89 [place] size with [facility] dilators.  Hiatal hernia  . Colonoscopy  N/A 05/28/2013    Procedure: COLONOSCOPY;  Surgeon: Daneil Dolin, MD;  [facility];  Service: Endoscopy;  Laterality: N/A;  8:30    There were no vitals taken for this visit.  Visit Diagnosis:  Stiffness of ankle joint, unspecified laterality  Pain in joint, ankle and foot, unspecified laterality  Poor balance      Subjective Assessment - 09/01/14 1309    Symptoms Pt stated compliance with stretches 4x daily and has been walking more daily.  Pain scale 8 1/2 Rt foot today   Currently in Pain? Yes   Pain Score 9    Pain Location Foot   Pain Orientation Right                    [facility] Adult PT Treatment/Exercise - 09/01/14 0001    Exercises   Exercises Ankle   Modalities   Modalities Cryotherapy   Cryotherapy   Number Minutes Cryotherapy 10 Minutes   Cryotherapy Location Ankle   Type of Cryotherapy Ice pack   Manual Therapy   Manual Therapy Massage   Massage Rt achilles tendon with STM to gastroc/soleus complex   Ankle Exercises: Stretches   Plantar Fascia Stretch 3 reps;30 seconds   Gastroc Stretch 3 reps;30 seconds  Gastroc Stretch Limitations slant board   Ankle Exercises: Standing   BAPS Standing;Level 2;10 reps   BAPS Limitations A/P and Inv/Env   Other Standing Ankle Exercises gait training  Focus on heel to toe and decrease Inv with Rt heel touch                  PT Short Term Goals - 09/01/14 1314    PT SHORT TERM GOAL #1   Title I HEP   Status On-going   PT SHORT TERM GOAL #2   Title Pain no greater than a 5/10   PT SHORT TERM GOAL #3   Title Pt ROM to be to 5 degree dorsiflextion to normalize ge   Status On-going   PT SHORT TERM GOAL #4   Title Pt to be able to SLS x 10 seconds B to reduce risk of falling           PT Long Term Goals - 09/01/14 1315    PT LONG TERM GOAL #1   Title I in advance HEP   PT LONG TERM GOAL #2   Title Pt pain to be decreased to no more than 3/10 80% of the day   PT LONG TERM GOAL  #3   Title Pt to be walking without quad cane   PT LONG TERM GOAL #4   Title Pt to be able to SLS for 15 seconds B to reduce risk of falling               Plan - 09/01/14 1351    Clinical Impression Statement Pt continued to ambulate with flat feet and increased inversion when cued for heel to toe.  Gait training complete to improve gait mechanics with multimodal cueing required.  Continued stanced BAPS with therapist facilitation to improve Added manual to improve AROM especaily eversion and dorsiflexion.  Reduced therex activities and increased time with manual techniques with noted edema  Lt LE, gentle retromassage, PROM, and STM to gastroc/soleus matrix for pain control.  Ended with ice pack elevated for pain and edema control.  Pt stated pain reduced to 6 07/11/08 at end of sessoin.     PT Next Visit Plan Continue to improve gait mechanics, AROM, strengthening as pain reduced and use manual techniques for stretching and mobs to decrease pain.         Problem List Patient Active Problem List   Diagnosis Date Noted  . Rectal bleeding 04/07/2013  . Melena 03/04/2013  . Hematemesis 03/04/2013  . Abdominal pain, epigastric 03/04/2013  . Hypokalemia 03/04/2013  . Smoker 01/16/2012  . Diarrhea 12/26/2011  . Pulmonary nodule, right 12/26/2011  . Fatty liver 08/24/2011  . Constipation 10/20/2010  . NAUSEA AND VOMITING 06/09/2010  . HEMATOCHEZIA 04/21/2010  . OTHER DYSPHAGIA 04/21/2010  . MILD MENTAL RETARDATION 02/17/2009  . GLAUCOMA 02/17/2009  . DM 01/15/2009  . OBESITY 01/15/2009  . ANXIETY 01/15/2009  . UNSPECIFIED MENTAL RETARDATION 01/15/2009  . MIGRAINE HEADACHE 01/15/2009  . HYPERTENSION 01/15/2009  . HEMORRHOIDS 01/15/2009  . GASTROESOPHAGEAL REFLUX DISEASE, CHRONIC 01/15/2009  . CONSTIPATION, CHRONIC 01/15/2009  . IBS 01/15/2009  . KNEE PAIN, CHRONIC 01/15/2009  . BACK PAIN, CHRONIC 01/15/2009  . COLONIC POLYPS, ADENOMATOUS, HX OF 01/15/2009   Ihor Austin, [place]  Aldona Lento 09/01/2014, 2:05 PM  [facility] [address] [place], [place], [address] Phone: [phone]   Fax:  [phone]

## 2014-09-01 NOTE — Addendum Note (Signed)
Encounter addended by: Leeroy Cha, PT on: 09/01/2014  2:07 PM<BR>     Documentation filed: Letters

## 2014-09-02 DIAGNOSIS — L602 Onychogryphosis: Secondary | ICD-10-CM | POA: Diagnosis not present

## 2014-09-02 DIAGNOSIS — L84 Corns and callosities: Secondary | ICD-10-CM | POA: Diagnosis not present

## 2014-09-02 DIAGNOSIS — E1140 Type 2 diabetes mellitus with diabetic neuropathy, unspecified: Secondary | ICD-10-CM | POA: Diagnosis not present

## 2014-09-03 ENCOUNTER — Encounter (HOSPITAL_COMMUNITY): Payer: Self-pay | Admitting: Psychiatry

## 2014-09-03 ENCOUNTER — Ambulatory Visit (INDEPENDENT_AMBULATORY_CARE_PROVIDER_SITE_OTHER): Payer: 59 | Admitting: Psychiatry

## 2014-09-03 VITALS — BP 127/82 | HR 90 | Ht 70.0 in | Wt 241.4 lb

## 2014-09-03 DIAGNOSIS — F251 Schizoaffective disorder, depressive type: Secondary | ICD-10-CM

## 2014-09-03 DIAGNOSIS — F259 Schizoaffective disorder, unspecified: Secondary | ICD-10-CM

## 2014-09-03 MED ORDER — BUPROPION HCL ER (SR) 150 MG PO TB12: 150.0000 mg | ORAL_TABLET | Freq: Two times a day (BID) | ORAL | Status: DC

## 2014-09-03 MED ORDER — LAMOTRIGINE 100 MG PO TABS: 100.0000 mg | ORAL_TABLET | Freq: Two times a day (BID) | ORAL | Status: DC

## 2014-09-03 MED ORDER — TRAZODONE HCL 100 MG PO TABS: 100.0000 mg | ORAL_TABLET | Freq: Every day | ORAL | Status: DC

## 2014-09-03 MED ORDER — PALIPERIDONE ER 9 MG PO TB24: 9.0000 mg | ORAL_TABLET | Freq: Every day | ORAL | Status: DC
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
## 2014-09-09 ENCOUNTER — Ambulatory Visit (HOSPITAL_COMMUNITY): Payer: Medicare Other | Attending: Foot & Ankle Surgery | Admitting: Physical Therapy

## 2014-09-09 DIAGNOSIS — I10 Essential (primary) hypertension: Secondary | ICD-10-CM | POA: Insufficient documentation

## 2014-09-09 DIAGNOSIS — M25572 Pain in left ankle and joints of left foot: Secondary | ICD-10-CM | POA: Insufficient documentation

## 2014-09-09 DIAGNOSIS — R531 Weakness: Secondary | ICD-10-CM | POA: Insufficient documentation

## 2014-09-09 DIAGNOSIS — R2689 Other abnormalities of gait and mobility: Secondary | ICD-10-CM | POA: Insufficient documentation

## 2014-09-09 DIAGNOSIS — M25571 Pain in right ankle and joints of right foot: Secondary | ICD-10-CM | POA: Insufficient documentation

## 2014-09-09 DIAGNOSIS — E119 Type 2 diabetes mellitus without complications: Secondary | ICD-10-CM | POA: Insufficient documentation

## 2014-09-09 DIAGNOSIS — M25672 Stiffness of left ankle, not elsewhere classified: Secondary | ICD-10-CM | POA: Insufficient documentation

## 2014-09-09 DIAGNOSIS — M25671 Stiffness of right ankle, not elsewhere classified: Secondary | ICD-10-CM | POA: Insufficient documentation

## 2014-09-14 ENCOUNTER — Ambulatory Visit (HOSPITAL_COMMUNITY): Payer: Medicare Other | Admitting: Physical Therapy

## 2014-09-14 DIAGNOSIS — M25579 Pain in unspecified ankle and joints of unspecified foot: Secondary | ICD-10-CM

## 2014-09-14 DIAGNOSIS — M25673 Stiffness of unspecified ankle, not elsewhere classified: Secondary | ICD-10-CM

## 2014-09-14 DIAGNOSIS — M25572 Pain in left ankle and joints of left foot: Secondary | ICD-10-CM | POA: Diagnosis not present

## 2014-09-14 DIAGNOSIS — R2689 Other abnormalities of gait and mobility: Secondary | ICD-10-CM

## 2014-09-14 DIAGNOSIS — E119 Type 2 diabetes mellitus without complications: Secondary | ICD-10-CM | POA: Diagnosis not present

## 2014-09-14 DIAGNOSIS — R531 Weakness: Secondary | ICD-10-CM | POA: Diagnosis not present

## 2014-09-14 DIAGNOSIS — I10 Essential (primary) hypertension: Secondary | ICD-10-CM | POA: Diagnosis not present

## 2014-09-14 DIAGNOSIS — M25571 Pain in right ankle and joints of right foot: Secondary | ICD-10-CM | POA: Diagnosis not present

## 2014-09-14 DIAGNOSIS — M25671 Stiffness of right ankle, not elsewhere classified: Secondary | ICD-10-CM | POA: Diagnosis not present

## 2014-09-14 DIAGNOSIS — M25672 Stiffness of left ankle, not elsewhere classified: Secondary | ICD-10-CM | POA: Diagnosis not present

## 2014-09-14 NOTE — Therapy (Signed)
[place], [place], [address] Phone: [phone]   Fax:  [phone]  Physical Therapy Treatment  Patient Details  Name: [name] MRN: [id number] Date of Birth: 08-Jun-1961 Referring Provider:  Francee Piccolo, MD  Encounter Date: 09/14/2014      PT End of Session - 09/14/14 1015    Visit Number 7   Number of Visits 9   Date for PT Re-Evaluation 10/10/14   Authorization Type [employer] medicare   Authorization - Visit Number 7   Authorization - Number of Visits 9   PT Start Time 0930   PT Stop Time 1012   PT Time Calculation (min) 42 min   Activity Tolerance Patient tolerated treatment well   Behavior During Therapy [facility] for tasks assessed/performed      Past Medical History  Diagnosis Date  . Mental retardation   . Hyperglycemia   . HTN (hypertension)   . Migraines   . Anxiety   . IBS (irritable bowel syndrome)   . Diabetes mellitus   . Lung nodules     right, followed by PCP, PET 11/2011  . MI (myocardial infarction)   . Diabetes mellitus, type II     Past Surgical History  Procedure Laterality Date  . Abdominal hysterectomy    . Foot surgery    . External ear surgery      bilateral  . Glaucoma surgery    . Colonoscopy  11/2007    hyperplastic polyps, prior hx of adenomas   . Small bowel capsule  10/2007    normal  . Esophagogastroduodenoscopy  08/2007    moderate sized hiatal hernia  . Colonoscopy  05/2010    incomplete due to poor prep, hyperplastic rectal polyp  . Esophagogastroduodenoscopy  05/2010    noncritical schatzki ring s/p 31F  . Colonoscopy  05/05/2002    Dimunitive polyps in the rectum and left colon, cold    biopsied/removed.  Scattered few left-sided diverticula.  Regular colonic   mucosa appeared normal  . Esophagogastroduodenoscopy (egd) with esophageal dilation N/A 02/06/2013    ATF:TDDUKG esophagus-s/p dilation up to a 67 [place] size with [facility]  dilators.  Hiatal hernia  .  Colonoscopy N/A 05/28/2013    Procedure: COLONOSCOPY;  Surgeon: Daneil Dolin, MD;  [facility];  Service: Endoscopy;  Laterality: N/A;  8:30    There were no vitals taken for this visit.  Visit Diagnosis:  Stiffness of ankle joint, unspecified laterality  Pain in joint, ankle and foot, unspecified laterality  Poor balance      Subjective Assessment - 09/14/14 0945    Symptoms Patient states that she is having a lot of pain, just an [place] weekend; pain 10/10 L foot and 9/10 R foot   Currently in Pain? Yes   Pain Score 9    Pain Location Foot   Pain Orientation --  Bilateral feet 9-10/10 pain                    [facility] Adult PT Treatment/Exercise - 09/14/14 0001    Manual Therapy   Manual Therapy Massage   Massage L achilles tendona nd distal calf musculature   Ankle Exercises: Stretches   Gastroc Stretch 3 reps;30 seconds   Gastroc Stretch Limitations on stairs   Other Stretch Piriformis stretch 2x30 seconds, seated   Other Stretch Attempted quad stretch in prone and sidelying; however patient had pain so stretch terminated   Ankle Exercises: Seated  ABC's 1 rep;Other (comment)  each side; wrote name with each foot instead of alphabet   Ankle Circles/Pumps Both;10 reps;Other (comment)  manual cues for proper performance   Other Seated Ankle Exercises Ankle PF/DF, inversion/eversion   Ankle Exercises: Standing   Other Standing Ankle Exercises Hip IR walks agsinst box 1x3 each side                PT Education - 09/14/14 1015    Education provided Yes   Education Details education on hip tightness and impact on gait and possible impact on ankle/foot pain   Person(s) Educated Patient   Methods Explanation   Comprehension Verbalized understanding          PT Short Term Goals - 09/01/14 1314    PT SHORT TERM GOAL #1   Title I HEP   Status On-going   PT SHORT TERM GOAL #2   Title Pain no greater than a 5/10   PT SHORT TERM GOAL #3    Title Pt ROM to be to 5 degree dorsiflextion to normalize ge   Status On-going   PT SHORT TERM GOAL #4   Title Pt to be able to SLS x 10 seconds B to reduce risk of falling           PT Long Term Goals - 09/01/14 1315    PT LONG TERM GOAL #1   Title I in advance HEP   PT LONG TERM GOAL #2   Title Pt pain to be decreased to no more than 3/10 80% of the day   PT LONG TERM GOAL #3   Title Pt to be walking without quad cane   PT LONG TERM GOAL #4   Title Pt to be able to SLS for 15 seconds B to reduce risk of falling               Plan - 09/14/14 1016    Clinical Impression Statement Patient presents with hip tightness especially in bilateral piriformis groups, as well reduced hip ER and IR during functional gait. Continues to display flat footed gait pattern. Patient may possibly have reduction in pain in feet as hips are loosened and overall hip range improves. Contniues to display significant tenderness to manual mobilization of soft tissues in bilateral achilles regsions.    Pt will benefit from skilled therapeutic intervention in order to improve on the following deficits Decreased balance;Pain;Increased muscle spasms;Decreased strength;Impaired flexibility;Decreased range of motion   PT Frequency 3x / week   PT Duration 3 weeks   PT Treatment/Interventions Therapeutic activities;Therapeutic exercise;Balance training;Gait training;Patient/family education;Manual techniques   PT Next Visit Plan Piriformis stretch and faciltiation of improved bilateral hip IR; gait training; functional stretching of hip and ankle; strengthening as appropriate; manual techniques   Consulted and Agree with Plan of Care Patient        Problem List Patient Active Problem List   Diagnosis Date Noted  . Rectal bleeding 04/07/2013  . Melena 03/04/2013  . Hematemesis 03/04/2013  . Abdominal pain, epigastric 03/04/2013  . Hypokalemia 03/04/2013  . Smoker 01/16/2012  . Diarrhea 12/26/2011  .  Pulmonary nodule, right 12/26/2011  . Fatty liver 08/24/2011  . Constipation 10/20/2010  . NAUSEA AND VOMITING 06/09/2010  . HEMATOCHEZIA 04/21/2010  . OTHER DYSPHAGIA 04/21/2010  . MILD MENTAL RETARDATION 02/17/2009  . GLAUCOMA 02/17/2009  . DM 01/15/2009  . OBESITY 01/15/2009  . ANXIETY 01/15/2009  . UNSPECIFIED MENTAL RETARDATION 01/15/2009  . MIGRAINE HEADACHE 01/15/2009  .  HYPERTENSION 01/15/2009  . HEMORRHOIDS 01/15/2009  . GASTROESOPHAGEAL REFLUX DISEASE, CHRONIC 01/15/2009  . CONSTIPATION, CHRONIC 01/15/2009  . IBS 01/15/2009  . KNEE PAIN, CHRONIC 01/15/2009  . BACK PAIN, CHRONIC 01/15/2009  . COLONIC POLYPS, ADENOMATOUS, HX OF 01/15/2009    Deniece Ree PT, DPT [place] [address] [place], [place], [address] Phone: [phone]   Fax:  [phone]

## 2014-09-18 ENCOUNTER — Ambulatory Visit (HOSPITAL_COMMUNITY): Payer: Medicare Other | Admitting: Physical Therapy

## 2014-09-18 DIAGNOSIS — R2689 Other abnormalities of gait and mobility: Secondary | ICD-10-CM

## 2014-09-18 DIAGNOSIS — M25672 Stiffness of left ankle, not elsewhere classified: Secondary | ICD-10-CM | POA: Diagnosis not present

## 2014-09-18 DIAGNOSIS — M25571 Pain in right ankle and joints of right foot: Secondary | ICD-10-CM | POA: Diagnosis not present

## 2014-09-18 DIAGNOSIS — M25572 Pain in left ankle and joints of left foot: Secondary | ICD-10-CM | POA: Diagnosis not present

## 2014-09-18 DIAGNOSIS — M25671 Stiffness of right ankle, not elsewhere classified: Secondary | ICD-10-CM | POA: Diagnosis not present

## 2014-09-18 DIAGNOSIS — E119 Type 2 diabetes mellitus without complications: Secondary | ICD-10-CM | POA: Diagnosis not present

## 2014-09-18 DIAGNOSIS — R531 Weakness: Secondary | ICD-10-CM | POA: Diagnosis not present

## 2014-09-18 DIAGNOSIS — I10 Essential (primary) hypertension: Secondary | ICD-10-CM | POA: Diagnosis not present

## 2014-09-18 DIAGNOSIS — M25579 Pain in unspecified ankle and joints of unspecified foot: Secondary | ICD-10-CM

## 2014-09-18 DIAGNOSIS — M25673 Stiffness of unspecified ankle, not elsewhere classified: Secondary | ICD-10-CM

## 2014-09-18 NOTE — Therapy (Signed)
[place], [place], [address] Phone: [phone]   Fax:  [phone]  Physical Therapy Treatment  Patient Details  Name: [name] MRN: [id number] Date of Birth: 1960/09/07 Referring Provider:  Francee Piccolo, MD  Encounter Date: 09/18/2014      PT End of Session - 09/18/14 1127    Visit Number 8   Number of Visits 9   Date for PT Re-Evaluation 10/10/14   Authorization Type [employer] medicare   Authorization - Visit Number 8   Authorization - Number of Visits 9   PT Start Time 3662   PT Stop Time 1100   PT Time Calculation (min) 42 min   Activity Tolerance Patient tolerated treatment well   Behavior During Therapy [facility] for tasks assessed/performed      Past Medical History  Diagnosis Date  . Mental retardation   . Hyperglycemia   . HTN (hypertension)   . Migraines   . Anxiety   . IBS (irritable bowel syndrome)   . Diabetes mellitus   . Lung nodules     right, followed by PCP, PET 11/2011  . MI (myocardial infarction)   . Diabetes mellitus, type II     Past Surgical History  Procedure Laterality Date  . Abdominal hysterectomy    . Foot surgery    . External ear surgery      bilateral  . Glaucoma surgery    . Colonoscopy  11/2007    hyperplastic polyps, prior hx of adenomas   . Small bowel capsule  10/2007    normal  . Esophagogastroduodenoscopy  08/2007    moderate sized hiatal hernia  . Colonoscopy  05/2010    incomplete due to poor prep, hyperplastic rectal polyp  . Esophagogastroduodenoscopy  05/2010    noncritical schatzki ring s/p 15F  . Colonoscopy  05/05/2002    Dimunitive polyps in the rectum and left colon, cold    biopsied/removed.  Scattered few left-sided diverticula.  Regular colonic   mucosa appeared normal  . Esophagogastroduodenoscopy (egd) with esophageal dilation N/A 02/06/2013    HUT:MLYYTK esophagus-s/p dilation up to a 44 [place] size with [facility] dilators.  Hiatal hernia  .  Colonoscopy N/A 05/28/2013    Procedure: COLONOSCOPY;  Surgeon: Daneil Dolin, MD;  [facility];  Service: Endoscopy;  Laterality: N/A;  8:30    There were no vitals filed for this visit.  Visit Diagnosis:  Stiffness of ankle joint, unspecified laterality  Pain in joint, ankle and foot, unspecified laterality  Poor balance      Subjective Assessment - 09/18/14 1102    Symptoms Patient continues to have high levels of pain especially in the R ankle   Currently in Pain? Yes   Pain Score 10-Worst pain ever   Pain Location Ankle   Pain Orientation Right                       [facility] Adult PT Treatment/Exercise - 09/18/14 0001    Manual Therapy   Manual Therapy Massage;Other (comment)   Massage Attempted right ankle but patient very tender, unable to tolerate massage   Ankle Exercises: Stretches   Gastroc Stretch 3 reps;30 seconds   Gastroc Stretch Limitations on stairs   Other Stretch Piriformis stretch 2x30 seconds, seated   Other Stretch Hamstring stretch 3x30 seconds   Ankle Exercises: Seated   ABC's 1 rep;Other (comment)  each leg   Ankle Circles/Pumps Both;10 reps  Towel Inversion/Eversion 1 rep;Other (comment)  1x10 bilateral   Other Seated Ankle Exercises Ankle PF/DF, inversion/eversion   Ankle Exercises: Standing   Rocker Board 2 minutes   Rebounder anterior/posterior   Other Standing Ankle Exercises Attempted hip IR box walks; unable to perform against box but unable due to pain so performed manually                  PT Short Term Goals - 09/01/14 1314    PT SHORT TERM GOAL #1   Title I HEP   Status On-going   PT SHORT TERM GOAL #2   Title Pain no greater than a 5/10   PT SHORT TERM GOAL #3   Title Pt ROM to be to 5 degree dorsiflextion to normalize ge   Status On-going   PT SHORT TERM GOAL #4   Title Pt to be able to SLS x 10 seconds B to reduce risk of falling           PT Long Term Goals - 09/01/14 1315    PT  LONG TERM GOAL #1   Title I in advance HEP   PT LONG TERM GOAL #2   Title Pt pain to be decreased to no more than 3/10 80% of the day   PT LONG TERM GOAL #3   Title Pt to be walking without quad cane   PT LONG TERM GOAL #4   Title Pt to be able to SLS for 15 seconds B to reduce risk of falling               Plan - 09/18/14 1129    Clinical Impression Statement Patient continues to display significant pain in ankles, especially the right side on this date, and appears to be making very slow if any progress with skilled PT services. Attempted massage to tight area however patient unable to tolerate at this time due to pain and tenderness in area. Very poor technique during all exercises today despite Mod-Max cues and demonstration from therapist.    Pt will benefit from skilled therapeutic intervention in order to improve on the following deficits Decreased balance;Pain;Increased muscle spasms;Decreased strength;Impaired flexibility;Decreased range of motion   PT Frequency 3x / week   PT Duration 3 weeks   PT Treatment/Interventions Therapeutic activities;Therapeutic exercise;Balance training;Gait training;Patient/family education;Manual techniques   PT Next Visit Plan Piriformis stretch and faciltiation of improved bilateral hip IR; gait training; functional stretching of hip and ankle; strengthening as appropriate; manual techniques   Consulted and Agree with Plan of Care Patient        Problem List Patient Active Problem List   Diagnosis Date Noted  . Rectal bleeding 04/07/2013  . Melena 03/04/2013  . Hematemesis 03/04/2013  . Abdominal pain, epigastric 03/04/2013  . Hypokalemia 03/04/2013  . Smoker 01/16/2012  . Diarrhea 12/26/2011  . Pulmonary nodule, right 12/26/2011  . Fatty liver 08/24/2011  . Constipation 10/20/2010  . NAUSEA AND VOMITING 06/09/2010  . HEMATOCHEZIA 04/21/2010  . OTHER DYSPHAGIA 04/21/2010  . MILD MENTAL RETARDATION 02/17/2009  . GLAUCOMA  02/17/2009  . DM 01/15/2009  . OBESITY 01/15/2009  . ANXIETY 01/15/2009  . UNSPECIFIED MENTAL RETARDATION 01/15/2009  . MIGRAINE HEADACHE 01/15/2009  . HYPERTENSION 01/15/2009  . HEMORRHOIDS 01/15/2009  . GASTROESOPHAGEAL REFLUX DISEASE, CHRONIC 01/15/2009  . CONSTIPATION, CHRONIC 01/15/2009  . IBS 01/15/2009  . KNEE PAIN, CHRONIC 01/15/2009  . BACK PAIN, CHRONIC 01/15/2009  . COLONIC POLYPS, ADENOMATOUS, HX OF 01/15/2009    Deniece Ree  PT, DPT [place] [address] [place], [place], [address] Phone: [phone]   Fax:  [phone]

## 2014-09-23 ENCOUNTER — Ambulatory Visit (HOSPITAL_COMMUNITY): Payer: Medicare Other | Admitting: Physical Therapy

## 2014-09-23 ENCOUNTER — Ambulatory Visit (HOSPITAL_COMMUNITY)
Admission: RE | Admit: 2014-09-23 | Discharge: 2014-09-23 | Disposition: A | Discharge status: Home / Self Care | Payer: Medicare Other | Source: Ambulatory Visit | Attending: Family Medicine | Admitting: Family Medicine

## 2014-09-23 ENCOUNTER — Ambulatory Visit (INDEPENDENT_AMBULATORY_CARE_PROVIDER_SITE_OTHER): Payer: Medicare Other | Admitting: Family Medicine

## 2014-09-23 VITALS — Ht 70.0 in | Wt 240.0 lb

## 2014-09-23 DIAGNOSIS — M25572 Pain in left ankle and joints of left foot: Secondary | ICD-10-CM | POA: Diagnosis not present

## 2014-09-23 DIAGNOSIS — E119 Type 2 diabetes mellitus without complications: Secondary | ICD-10-CM

## 2014-09-23 DIAGNOSIS — M25571 Pain in right ankle and joints of right foot: Secondary | ICD-10-CM | POA: Diagnosis not present

## 2014-09-23 DIAGNOSIS — Z1231 Encounter for screening mammogram for malignant neoplasm of breast: Secondary | ICD-10-CM

## 2014-09-23 DIAGNOSIS — R2689 Other abnormalities of gait and mobility: Secondary | ICD-10-CM | POA: Diagnosis not present

## 2014-09-23 DIAGNOSIS — M25673 Stiffness of unspecified ankle, not elsewhere classified: Secondary | ICD-10-CM

## 2014-09-23 DIAGNOSIS — M25579 Pain in unspecified ankle and joints of unspecified foot: Secondary | ICD-10-CM

## 2014-09-23 DIAGNOSIS — I10 Essential (primary) hypertension: Secondary | ICD-10-CM | POA: Diagnosis not present

## 2014-09-23 DIAGNOSIS — M25671 Stiffness of right ankle, not elsewhere classified: Secondary | ICD-10-CM | POA: Diagnosis not present

## 2014-09-23 DIAGNOSIS — M25672 Stiffness of left ankle, not elsewhere classified: Secondary | ICD-10-CM | POA: Diagnosis not present

## 2014-09-23 DIAGNOSIS — R531 Weakness: Secondary | ICD-10-CM | POA: Diagnosis not present

## 2014-09-23 LAB — POCT GLYCOSYLATED HEMOGLOBIN (HGB A1C): Hemoglobin A1C: 5.8

## 2014-09-23 NOTE — Therapy (Signed)
[place], [place], [address] Phone: [phone]   Fax:  [phone]  Physical Therapy reassessment   Patient Details  Name: [name] MRN: [id number] Date of Birth: 1961/02/17 Referring Provider:  Francee Piccolo, MD  Encounter Date: 09/23/2014 PHYSICAL THERAPY DISCHARGE SUMMARY   Plan: Patient agrees to discharge.  Patient goals were partially met. Patient is being discharged due to lack of progress.  ?????    ]     PT End of Session - 09/23/14 1019    Visit Number 9   Number of Visits [place] - Visit Number 9   Authorization - Number of Visits 9   PT Start Time [phone]   PT Stop Time 1022   PT Time Calculation (min) 46 min      Past Medical History  Diagnosis Date  . Mental retardation   . Hyperglycemia   . HTN (hypertension)   . Migraines   . Anxiety   . IBS (irritable bowel syndrome)   . Diabetes mellitus   . Lung nodules     right, followed by PCP, PET 11/2011  . MI (myocardial infarction)   . Diabetes mellitus, type II     Past Surgical History  Procedure Laterality Date  . Abdominal hysterectomy    . Foot surgery    . External ear surgery      bilateral  . Glaucoma surgery    . Colonoscopy  11/2007    hyperplastic polyps, prior hx of adenomas   . Small bowel capsule  10/2007    normal  . Esophagogastroduodenoscopy  08/2007    moderate sized hiatal hernia  . Colonoscopy  05/2010    incomplete due to poor prep, hyperplastic rectal polyp  . Esophagogastroduodenoscopy  05/2010    noncritical schatzki ring s/p 60F  . Colonoscopy  05/05/2002    Dimunitive polyps in the rectum and left colon, cold    biopsied/removed.  Scattered few left-sided diverticula.  Regular colonic   mucosa appeared normal  . Esophagogastroduodenoscopy (egd) with esophageal dilation N/A 02/06/2013    XIH:WTUUEK esophagus-s/p dilation up to a 76 [place] size with [facility]  dilators.  Hiatal hernia  . Colonoscopy N/A 05/28/2013    Procedure: COLONOSCOPY;  Surgeon: Daneil Dolin, MD;  [facility];  Service: Endoscopy;  Laterality: N/A;  8:30    There were no vitals filed for this visit.  Visit Diagnosis:  No diagnosis found.      Subjective Assessment - 09/23/14 0939    Symptoms Pt states she is still having quite a bit of pain in her Rt foot on the top and middle ,(not in back where achilles tendonitis would be located)   How long can you stand comfortably? able to stand for an hour was 30 minutes    How long can you walk comfortably? 60 minutes witn quad cane was 40    Currently in Pain? Yes   Pain Score 10-Worst pain ever   pt appears very relaxed             [facility] PT Assessment - 09/23/14 0001    Assessment   Medical Diagnosis achilles tendonitis B   Onset Date 05/11/14   Prior Therapy [facility] physcial therapy   Precautions   Precautions None   Restrictions   Weight Bearing Restrictions No   Balance Screen   Has the patient fallen in the past 6  months Yes   How many times? 3  no falls since therapy started    Has the patient had a decrease in activity level because of a fear of falling?  Yes   Is the patient reluctant to leave their home because of a fear of falling?  No   Prior Function   Vocation On disability   AROM   Right Ankle Dorsiflexion -5  passive to neutral   Right Ankle Plantar Flexion 50   Right Ankle Inversion 22   Right Ankle Eversion 12   Left Ankle Dorsiflexion -3   Left Ankle Plantar Flexion 55   Left Ankle Inversion 20   Left Ankle Eversion 2   Strength   Right Ankle Dorsiflexion 5/5   Right Ankle Plantar Flexion 3/5   Right Ankle Inversion 4/5   Right Ankle Eversion 5/5   Left Ankle Dorsiflexion 5/5   Left Ankle Plantar Flexion 4+/5   Left Ankle Inversion 5/5   Left Ankle Eversion 5/5   Flexibility   Soft Tissue Assessment /Muscle Length yes   Palpation   Palpation Tight mm in  gastroc B    Balance   Balance Assessed --  Single leg stance:  Rt= 4 seconds; Lt 4 seconds            [facility] Adult PT Treatment/Exercise - 09/23/14 0001    Manual Therapy   Manual Therapy Joint mobilization;Edema management;Massage   Edema Management decongestion    Joint Mobilization mid tarsal    Massage Rt gastroc    Ankle Exercises: Stretches   Plantar Fascia Stretch 3 reps;30 seconds   Gastroc Stretch 3 reps;30 seconds   Gastroc Stretch Limitations at wall    Ankle Exercises: Standing   SLS Lt only secondary to increased pain on Rt Lt max = 22 '"   Heel Raises 10 reps  Then 10  Lt only    Toe Raise 10 reps                PT Education - 09/23/14 1019    Education provided Yes   Education Details reviewed HEP    Methods Explanation   Comprehension Returned demonstration          PT Short Term Goals - 09/23/14 1005    PT SHORT TERM GOAL #4   Title Pt to be able to SLS x 10 seconds B to reduce risk of falling   Time 10   Status Achieved           PT Long Term Goals - 09/23/14 1005    PT LONG TERM GOAL #4   Title Pt to be able to SLS for 15 seconds B to reduce risk of falling   Time 3   Period Weeks   Status Partially Met  Lt 22 seconds; Rt too painful                Plan - 09/23/14 1019    Clinical Impression Statement Pt has been seen at this clinic for 3 weeks, 9 treatments.  She was seen at a physical therapy facility in [place] for 8 months previous to this with no significant subjective improvement.  Pt and therapist reviewed HEP and agreed that pt should be discharged to a HEP>    PT Next Visit Plan Discharge.          Problem List Patient Active Problem List   Diagnosis Date Noted  . Rectal bleeding 04/07/2013  . Melena 03/04/2013  .  Hematemesis 03/04/2013  . Abdominal pain, epigastric 03/04/2013  . Hypokalemia 03/04/2013  . Smoker 01/16/2012  . Diarrhea 12/26/2011  . Pulmonary nodule, right 12/26/2011  . Fatty  liver 08/24/2011  . Constipation 10/20/2010  . NAUSEA AND VOMITING 06/09/2010  . HEMATOCHEZIA 04/21/2010  . OTHER DYSPHAGIA 04/21/2010  . MILD MENTAL RETARDATION 02/17/2009  . GLAUCOMA 02/17/2009  . DM 01/15/2009  . OBESITY 01/15/2009  . ANXIETY 01/15/2009  . UNSPECIFIED MENTAL RETARDATION 01/15/2009  . MIGRAINE HEADACHE 01/15/2009  . HYPERTENSION 01/15/2009  . HEMORRHOIDS 01/15/2009  . GASTROESOPHAGEAL REFLUX DISEASE, CHRONIC 01/15/2009  . CONSTIPATION, CHRONIC 01/15/2009  . IBS 01/15/2009  . KNEE PAIN, CHRONIC 01/15/2009  . BACK PAIN, CHRONIC 01/15/2009  . COLONIC POLYPS, ADENOMATOUS, HX OF 01/15/2009    RUSSELL,CINDY PT 09/23/2014, 12:26 PM  [place] [address] [place], [place], [address] Phone: [phone]   Fax:  [phone]

## 2014-09-23 NOTE — Progress Notes (Signed)
   Subjective:    Patient ID: [name], female    DOB: 06/03/1961, 54 y.o.   MRN: [id number]  Diabetes She presents for her follow-up diabetic visit. She has type 2 diabetes mellitus. Pertinent negatives for hypoglycemia include no confusion. Pertinent negatives for diabetes include no chest pain, no fatigue, no polydipsia, no polyphagia and no weakness. Risk factors for coronary artery disease include diabetes mellitus. Current diabetic treatment includes oral agent (monotherapy). She is compliant with treatment all of the time. She sees a podiatrist.Eye exam is not current.   Patient had mammo today and been doing therapy ordered by foot doctor for several months with no measurable improvement.   Review of Systems  Constitutional: Negative for activity change, appetite change and fatigue.  HENT: Negative for congestion.   Respiratory: Negative for cough.   Cardiovascular: Negative for chest pain.  Gastrointestinal: Negative for abdominal pain.  Endocrine: Negative for polydipsia and polyphagia.  Neurological: Negative for weakness.  Psychiatric/Behavioral: Negative for confusion.       Objective:   Physical Exam  Constitutional: She appears well-nourished. No distress.  Cardiovascular: Normal rate, regular rhythm and normal heart sounds.   No murmur heard. Pulmonary/Chest: Effort normal and breath sounds normal. No respiratory distress.  Musculoskeletal: She exhibits no edema.  Lymphadenopathy:    She has no cervical adenopathy.  Neurological: She is alert. She exhibits normal muscle tone.  Psychiatric: Her behavior is normal.  Vitals reviewed.         Assessment & Plan:  A1c looks great Blood pressure looks good continue current measures Follow-up 6 months sooner if any problems more comprehensive lab work on follow-up

## 2014-09-23 NOTE — Patient Instructions (Signed)
Please do your eye exam with Dr Jorja Loa  Follow up here in the summer  Will do blood work in the summer

## 2014-09-24 ENCOUNTER — Other Ambulatory Visit: Payer: Self-pay | Admitting: Family Medicine

## 2014-09-24 ENCOUNTER — Ambulatory Visit (HOSPITAL_COMMUNITY): Payer: Medicare Other | Admitting: Physical Therapy

## 2014-09-28 ENCOUNTER — Ambulatory Visit (HOSPITAL_COMMUNITY): Payer: Medicare Other | Admitting: Physical Therapy

## 2014-09-30 ENCOUNTER — Encounter (HOSPITAL_COMMUNITY): Payer: Self-pay | Admitting: Physical Therapy

## 2014-10-05 ENCOUNTER — Encounter (HOSPITAL_COMMUNITY): Payer: Self-pay | Admitting: Physical Therapy

## 2014-10-07 ENCOUNTER — Encounter (HOSPITAL_COMMUNITY): Payer: Self-pay | Admitting: Physical Therapy

## 2014-10-20 ENCOUNTER — Encounter: Payer: Self-pay | Admitting: Family Medicine

## 2014-10-20 ENCOUNTER — Ambulatory Visit (INDEPENDENT_AMBULATORY_CARE_PROVIDER_SITE_OTHER): Payer: Medicare Other | Admitting: Family Medicine

## 2014-10-20 VITALS — BP 128/82 | Temp 98.8°F | Ht 70.0 in | Wt 242.8 lb

## 2014-10-20 DIAGNOSIS — J019 Acute sinusitis, unspecified: Secondary | ICD-10-CM | POA: Diagnosis not present

## 2014-10-20 DIAGNOSIS — B9689 Other specified bacterial agents as the cause of diseases classified elsewhere: Secondary | ICD-10-CM

## 2014-10-20 MED ORDER — CEFPROZIL 500 MG PO TABS: 500.0000 mg | ORAL_TABLET | Freq: Two times a day (BID) | ORAL | Status: DC

## 2014-10-20 MED ORDER — LORATADINE 10 MG PO TABS: 10.0000 mg | ORAL_TABLET | Freq: Every day | ORAL | Status: DC

## 2014-10-20 NOTE — Progress Notes (Signed)
   Subjective:    Patient ID: [name], female    DOB: February 26, 1961, 54 y.o.   MRN: [id number]  Fever  This is a new problem. The current episode started in the past 7 days. Associated symptoms include abdominal pain, congestion, coughing, diarrhea, ear pain, headaches, muscle aches, nausea, a sore throat and vomiting. Pertinent negatives include no chest pain or wheezing. She has tried nothing for the symptoms.   PMH benign   Review of Systems  Constitutional: Positive for fever. Negative for activity change.  HENT: Positive for congestion, ear pain, rhinorrhea and sore throat.   Eyes: Negative for discharge.  Respiratory: Positive for cough. Negative for shortness of breath and wheezing.   Cardiovascular: Negative for chest pain.  Gastrointestinal: Positive for nausea, vomiting, abdominal pain and diarrhea.  Neurological: Positive for headaches.       Objective:   Physical Exam  Constitutional: She appears well-developed.  HENT:  Head: Normocephalic.  Nose: Nose normal.  Mouth/Throat: Oropharynx is clear and moist. No oropharyngeal exudate.  Neck: Neck supple.  Cardiovascular: Normal rate and normal heart sounds.   No murmur heard. Pulmonary/Chest: Effort normal and breath sounds normal. She has no wheezes.  Lymphadenopathy:    She has no cervical adenopathy.  Skin: Skin is warm and dry.  Nursing note and vitals reviewed.   Her social worker assistance from social services was here with her.      Assessment & Plan:  Viral syndrome Secondary sinusitis Antibiotics prescribed Warning signs discussed with the patient keep regular follow-ups

## 2014-10-23 ENCOUNTER — Other Ambulatory Visit: Payer: Self-pay | Admitting: Family Medicine

## 2014-11-20 ENCOUNTER — Encounter: Payer: Self-pay | Admitting: Family Medicine

## 2014-11-20 ENCOUNTER — Ambulatory Visit (INDEPENDENT_AMBULATORY_CARE_PROVIDER_SITE_OTHER): Payer: Medicare Other | Admitting: Family Medicine

## 2014-11-20 VITALS — BP 112/70 | Temp 97.9°F | Ht 70.0 in | Wt 233.0 lb

## 2014-11-20 DIAGNOSIS — J019 Acute sinusitis, unspecified: Secondary | ICD-10-CM

## 2014-11-20 DIAGNOSIS — B9689 Other specified bacterial agents as the cause of diseases classified elsewhere: Secondary | ICD-10-CM

## 2014-11-20 MED ORDER — CEFDINIR 300 MG PO CAPS: 300.0000 mg | ORAL_CAPSULE | Freq: Two times a day (BID) | ORAL | Status: DC

## 2014-11-20 MED ORDER — HYDROCODONE-HOMATROPINE 5-1.5 MG/5ML PO SYRP: 5.0000 mL | ORAL_SOLUTION | Freq: Every evening | ORAL | Status: DC | PRN

## 2014-11-20 NOTE — Progress Notes (Signed)
   Subjective:    Patient ID: [name], female    DOB: 15-Jan-1961, 54 y.o.   MRN: [id number]  HPI Patient is here today because she has been hurting all over her body since Sunday morning. Joint pain, loss of appetite, headaches and mouth soreness are also noted. Treatments tried: Aleve with no relief.  Arms and legs aching  Back and hips pain  Mouth and throat and tongue inflammed and jurting   Patient states that she has no other concerns at this time.  Appetite not good  Did not measure a fever  Appetite pun and not feeling well     patient has recently had some nasal congestion cough and sore throat. Cough occasionally productive. Review of Systems     No high fevers or chills decent appetite no rash no change in bowel habits Objective:   Physical Exam  alert vitals stable HET moderate his congestion frontal tenderness pharynx normal lungs clear heart regular in rhythm.       Assessment & Plan:   impression rhinosinusitis with secondary features plan symptom care discussed. Warning signs discussed. Antibiotics prescribed

## 2014-11-23 ENCOUNTER — Other Ambulatory Visit (HOSPITAL_COMMUNITY): Payer: Self-pay | Admitting: Psychiatry

## 2014-11-24 DIAGNOSIS — H52223 Regular astigmatism, bilateral: Secondary | ICD-10-CM | POA: Diagnosis not present

## 2014-11-24 DIAGNOSIS — H5203 Hypermetropia, bilateral: Secondary | ICD-10-CM | POA: Diagnosis not present

## 2014-11-24 DIAGNOSIS — H524 Presbyopia: Secondary | ICD-10-CM | POA: Diagnosis not present

## 2014-11-24 DIAGNOSIS — E119 Type 2 diabetes mellitus without complications: Secondary | ICD-10-CM | POA: Diagnosis not present

## 2014-12-01 ENCOUNTER — Ambulatory Visit (INDEPENDENT_AMBULATORY_CARE_PROVIDER_SITE_OTHER): Payer: 59 | Admitting: Psychiatry

## 2014-12-01 ENCOUNTER — Encounter (HOSPITAL_COMMUNITY): Payer: Self-pay | Admitting: Psychiatry

## 2014-12-01 VITALS — BP 97/72 | HR 103 | Ht 70.0 in | Wt 232.8 lb

## 2014-12-01 DIAGNOSIS — F251 Schizoaffective disorder, depressive type: Secondary | ICD-10-CM

## 2014-12-01 MED ORDER — PALIPERIDONE ER 9 MG PO TB24: 9.0000 mg | ORAL_TABLET | Freq: Every day | ORAL | Status: DC

## 2014-12-01 MED ORDER — TRAZODONE HCL 100 MG PO TABS: 100.0000 mg | ORAL_TABLET | Freq: Every day | ORAL | Status: DC

## 2014-12-01 MED ORDER — LAMOTRIGINE 100 MG PO TABS: 100.0000 mg | ORAL_TABLET | Freq: Two times a day (BID) | ORAL | Status: DC

## 2014-12-01 MED ORDER — BUPROPION HCL ER (SR) 150 MG PO TB12: 150.0000 mg | ORAL_TABLET | Freq: Two times a day (BID) | ORAL | Status: DC
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
## 2014-12-03 DIAGNOSIS — L602 Onychogryphosis: Secondary | ICD-10-CM | POA: Diagnosis not present

## 2014-12-03 DIAGNOSIS — E1151 Type 2 diabetes mellitus with diabetic peripheral angiopathy without gangrene: Secondary | ICD-10-CM | POA: Diagnosis not present

## 2014-12-03 DIAGNOSIS — L84 Corns and callosities: Secondary | ICD-10-CM | POA: Diagnosis not present

## 2014-12-23 ENCOUNTER — Other Ambulatory Visit: Payer: Self-pay | Admitting: Family Medicine

## 2014-12-23 NOTE — Telephone Encounter (Signed)
Needs office visit.

## 2015-01-01 ENCOUNTER — Other Ambulatory Visit: Payer: Self-pay | Admitting: Cardiothoracic Surgery

## 2015-01-01 DIAGNOSIS — R911 Solitary pulmonary nodule: Secondary | ICD-10-CM

## 2015-01-13 ENCOUNTER — Telehealth: Payer: Self-pay | Admitting: Family Medicine

## 2015-01-13 DIAGNOSIS — R7309 Other abnormal glucose: Secondary | ICD-10-CM | POA: Diagnosis not present

## 2015-01-13 DIAGNOSIS — R809 Proteinuria, unspecified: Secondary | ICD-10-CM | POA: Diagnosis not present

## 2015-01-13 DIAGNOSIS — R7303 Prediabetes: Secondary | ICD-10-CM

## 2015-01-13 NOTE — Telephone Encounter (Signed)
Blood work ordered in [facility]. Johnny notified.

## 2015-01-13 NOTE — Telephone Encounter (Signed)
pts DSS helper states that Dr Nicki Reaper said to call in to get some labs for  Kidney levels? Please advise  Call [place] back   [phone]

## 2015-01-13 NOTE — Telephone Encounter (Signed)
Metabolic 7, hemoglobin [id number] and micro-proteinuria

## 2015-01-14 LAB — BASIC METABOLIC PANEL
BUN / CREAT RATIO: 9 (ref 9–23)
BUN: 7 mg/dL (ref 6–24)
CALCIUM: 9 mg/dL (ref 8.7–10.2)
CO2: 29 mmol/L (ref 18–29)
Chloride: 97 mmol/L (ref 97–108)
Creatinine, Ser: 0.78 mg/dL (ref 0.57–1.00)
GFR calc Af Amer: 101 mL/min/{1.73_m2} (ref >59)
GFR calc non Af Amer: 88 mL/min/{1.73_m2} (ref >59)
Glucose: 91 mg/dL (ref 65–99)
POTASSIUM: 4 mmol/L (ref 3.5–5.2)
SODIUM: 142 mmol/L (ref 134–144)

## 2015-01-14 LAB — HEMOGLOBIN A1C
Est. average glucose Bld gHb Est-mCnc: 134 mg/dL
HEMOGLOBIN A1C: 6.3 % — AB (ref 4.8–5.6)

## 2015-01-25 ENCOUNTER — Ambulatory Visit (INDEPENDENT_AMBULATORY_CARE_PROVIDER_SITE_OTHER): Payer: Medicare Other | Admitting: Family Medicine

## 2015-01-25 ENCOUNTER — Encounter: Payer: Self-pay | Admitting: Family Medicine

## 2015-01-25 VITALS — BP 118/74 | Ht 70.0 in | Wt 238.0 lb

## 2015-01-25 DIAGNOSIS — M25569 Pain in unspecified knee: Secondary | ICD-10-CM

## 2015-01-25 DIAGNOSIS — R911 Solitary pulmonary nodule: Secondary | ICD-10-CM | POA: Diagnosis not present

## 2015-01-25 DIAGNOSIS — I10 Essential (primary) hypertension: Secondary | ICD-10-CM | POA: Diagnosis not present

## 2015-01-25 DIAGNOSIS — Z720 Tobacco use: Secondary | ICD-10-CM | POA: Diagnosis not present

## 2015-01-25 DIAGNOSIS — R7303 Prediabetes: Secondary | ICD-10-CM

## 2015-01-25 DIAGNOSIS — F17200 Nicotine dependence, unspecified, uncomplicated: Secondary | ICD-10-CM

## 2015-01-25 DIAGNOSIS — R7309 Other abnormal glucose: Secondary | ICD-10-CM | POA: Diagnosis not present

## 2015-01-25 NOTE — Patient Instructions (Signed)
AVOID REGULAR SODAS  TRY TO QUIT SMOKING!!!!    Smoking Cessation Quitting smoking is important to your health and has many advantages. However, it is not always easy to quit since nicotine is a very addictive drug. Oftentimes, people try 3 times or more before being able to quit. This document explains the best ways for you to prepare to quit smoking. Quitting takes hard work and a lot of effort, but you can do it. ADVANTAGES OF QUITTING SMOKING  You will live longer, feel better, and live better.  Your body will feel the impact of quitting smoking almost immediately.  Within 20 minutes, blood pressure decreases. Your pulse returns to its normal level.  After 8 hours, carbon monoxide levels in the blood return to normal. Your oxygen level increases.  After 24 hours, the chance of having a heart attack starts to decrease. Your breath, hair, and body stop smelling like smoke.  After 48 hours, damaged nerve endings begin to recover. Your sense of taste and smell improve.  After 72 hours, the body is virtually free of nicotine. Your bronchial tubes relax and breathing becomes easier.  After 2 to 12 weeks, lungs can hold more air. Exercise becomes easier and circulation improves.  The risk of having a heart attack, stroke, cancer, or lung disease is greatly reduced.  After 1 year, the risk of coronary heart disease is cut in half.  After 5 years, the risk of stroke falls to the same as a nonsmoker.  After 10 years, the risk of lung cancer is cut in half and the risk of other cancers decreases significantly.  After 15 years, the risk of coronary heart disease drops, usually to the level of a nonsmoker.  If you are pregnant, quitting smoking will improve your chances of having a healthy baby.  The people you live with, especially any children, will be healthier.  You will have extra money to spend on things other than cigarettes. QUESTIONS TO THINK ABOUT BEFORE ATTEMPTING TO  QUIT You may want to talk about your answers with your health care provider.  Why do you want to quit?  If you tried to quit in the past, what helped and what did not?  What will be the most difficult situations for you after you quit? How will you plan to handle them?  Who can help you through the tough times? Your family? Friends? A health care provider?  What pleasures do you get from smoking? What ways can you still get pleasure if you quit? Here are some questions to ask your health care provider:  How can you help me to be successful at quitting?  What medicine do you think would be best for me and how should I take it?  What should I do if I need more help?  What is smoking withdrawal like? How can I get information on withdrawal? GET READY  Set a quit date.  Change your environment by getting rid of all cigarettes, ashtrays, matches, and lighters in your home, car, or work. Do not let people smoke in your home.  Review your past attempts to quit. Think about what worked and what did not. GET SUPPORT AND ENCOURAGEMENT You have a better chance of being successful if you have help. You can get support in many ways.  Tell your family, friends, and coworkers that you are going to quit and need their support. Ask them not to smoke around you.  Get individual, group, or telephone counseling and  support. Programs are available at [employer] and health centers. Call your local health department for information about programs in your area.  Spiritual beliefs and practices may help some smokers quit.  Download a "quit meter" on your computer to keep track of quit statistics, such as how long you have gone without smoking, cigarettes not smoked, and money saved.  Get a self-help book about quitting smoking and staying off tobacco. [place] yourself from urges to smoke. Talk to someone, go for a walk, or occupy your time with a task.  Change  your normal routine. Take a different route to work. Drink tea instead of coffee. Eat breakfast in a different place.  Reduce your stress. Take a hot bath, exercise, or read a book.  Plan something enjoyable to do every day. Reward yourself for not smoking.  Explore interactive web-based programs that specialize in helping you quit. GET MEDICINE AND USE IT CORRECTLY Medicines can help you stop smoking and decrease the urge to smoke. Combining medicine with the above behavioral methods and support can greatly increase your chances of successfully quitting smoking.  Nicotine replacement therapy helps deliver nicotine to your body without the negative effects and risks of smoking. Nicotine replacement therapy includes nicotine gum, lozenges, inhalers, nasal sprays, and skin patches. Some may be available over-the-counter and others require a prescription.  Antidepressant medicine helps people abstain from smoking, but how this works is unknown. This medicine is available by prescription.  Nicotinic receptor partial agonist medicine simulates the effect of nicotine in your brain. This medicine is available by prescription. Ask your health care provider for advice about which medicines to use and how to use them based on your health history. Your health care provider will tell you what side effects to look out for if you choose to be on a medicine or therapy. Carefully read the information on the package. Do not use any other product containing nicotine while using a nicotine replacement product.  RELAPSE OR DIFFICULT SITUATIONS Most relapses occur within the first 3 months after quitting. Do not be discouraged if you start smoking again. Remember, most people try several times before finally quitting. You may have symptoms of withdrawal because your body is used to nicotine. You may crave cigarettes, be irritable, feel very hungry, cough often, get headaches, or have difficulty concentrating. The  withdrawal symptoms are only temporary. They are strongest when you first quit, but they will go away within 10-14 days. To reduce the chances of relapse, try to:  Avoid drinking alcohol. Drinking lowers your chances of successfully quitting.  Reduce the amount of caffeine you consume. Once you quit smoking, the amount of caffeine in your body increases and can give you symptoms, such as a rapid heartbeat, sweating, and anxiety.  Avoid smokers because they can make you want to smoke.  Do not let weight gain distract you. Many smokers will gain weight when they quit, usually less than 10 pounds. Eat a healthy diet and stay active. You can always lose the weight gained after you quit.  Find ways to improve your mood other than smoking. FOR MORE INFORMATION  www.smokefree.gov  Document Released: 06/20/2001 Document Revised: 11/10/2013 Document Reviewed: 10/05/2011 [facility] Patient Information 2015 [place], [place]. This information is not intended to replace advice given to you by your health care provider. Make sure you discuss any questions you have with your health care provider. Diabetes Mellitus and Food It is important for you to manage  your blood sugar (glucose) level. Your blood glucose level can be greatly affected by what you eat. Eating healthier foods in the appropriate amounts throughout the day at about the same time each day will help you control your blood glucose level. It can also help slow or prevent worsening of your diabetes mellitus. Healthy eating may even help you improve the level of your blood pressure and reach or maintain a healthy weight.  HOW CAN FOOD AFFECT ME? Carbohydrates Carbohydrates affect your blood glucose level more than any other type of food. Your dietitian will help you determine how many carbohydrates to eat at each meal and teach you how to count carbohydrates. Counting carbohydrates is important to keep your blood glucose at a healthy level, especially if  you are using insulin or taking certain medicines for diabetes mellitus. Alcohol Alcohol can cause sudden decreases in blood glucose (hypoglycemia), especially if you use insulin or take certain medicines for diabetes mellitus. Hypoglycemia can be a life-threatening condition. Symptoms of hypoglycemia (sleepiness, dizziness, and disorientation) are similar to symptoms of having too much alcohol.  If your health care provider has given you approval to drink alcohol, do so in moderation and use the following guidelines:  Women should not have more than one drink per day, and men should not have more than two drinks per day. One drink is equal to:  12 oz of beer.  5 oz of wine.  1 oz of hard liquor.  Do not drink on an empty stomach.  Keep yourself hydrated. Have water, diet soda, or unsweetened iced tea.  Regular soda, juice, and other mixers might contain a lot of carbohydrates and should be counted. WHAT FOODS ARE NOT RECOMMENDED? As you make food choices, it is important to remember that all foods are not the same. Some foods have fewer nutrients per serving than other foods, even though they might have the same number of calories or carbohydrates. It is difficult to get your body what it needs when you eat foods with fewer nutrients. Examples of foods that you should avoid that are high in calories and carbohydrates but low in nutrients include:  Trans fats (most processed foods list trans fats on the Nutrition Facts label).  Regular soda.  Juice.  Candy.  Sweets, such as cake, pie, doughnuts, and cookies.  Fried foods. WHAT FOODS CAN I EAT? Have nutrient-rich foods, which will nourish your body and keep you healthy. The food you should eat also will depend on several factors, including:  The calories you need.  The medicines you take.  Your weight.  Your blood glucose level.  Your blood pressure level.  Your cholesterol level. You also should eat a variety of foods,  including:  Protein, such as meat, poultry, fish, tofu, nuts, and seeds (lean animal proteins are best).  Fruits.  Vegetables.  Dairy products, such as milk, cheese, and yogurt (low fat is best).  Breads, grains, pasta, cereal, rice, and beans.  Fats such as olive oil, trans fat-free margarine, canola oil, avocado, and olives. DOES EVERYONE WITH DIABETES MELLITUS HAVE THE SAME MEAL PLAN? Because every person with diabetes mellitus is different, there is not one meal plan that works for everyone. It is very important that you meet with a dietitian who will help you create a meal plan that is just right for you. Document Released: 03/23/2005 Document Revised: 07/01/2013 Document Reviewed: 05/23/2013 [facility] Patient Information 2015 [place], [place]. This information is not intended to replace advice given to you  by your health care provider. Make sure you discuss any questions you have with your health care provider.

## 2015-01-25 NOTE — Progress Notes (Signed)
   Subjective:    Patient ID: [name], female    DOB: [birth year], 54 y.o.   MRN: [id number]  HPI Pt arrives today for a diabetic checkup. Pt states last bs was 150. A1C done on bloodwork 7/6 was 6.3 She sees podiatrist next month. Last eye exam was May 17th.   Bilateral knee pain for the past 3 weeks. She does do some walking she know she needs to lose weight.  Bilateral foot pain. Started a while ago. At times she gets a little bit tingling in her feet but nothing severe  Having trouble with anxiety. She denies being depressed.  Requesting rx for shingles vaccine.   Review of Systems See above. Denies chest tightness pressure pain shortness breath nausea vomiting diarrhea    Objective:   Physical Exam On examination the neck had no masses her lungs are clear no crackles heart is regular pulse normal blood pressure good extremities no edema skin warm dry patient does have mild neuropathy to her feet she also has bunions with some pre-ulcerative calluses   The patient was counseled to quit smoking. She has a small pulmonary nodule for which they will be doing a follow-up CAT scan in the future. She was instructed that smoking increases her risk of cancer    Assessment & Plan:  Diabetes-she needs to stop drinking regular sodas she need to do a better job of being more physically active and watch her dietary intake we will repeat lab work again in 3 months. If A1c going up we will need to potentially add medication  HTN blood pressure good keep current measures  I don't think her insurance will pay for shingles vaccine they will check into it if so we will give a prescription  Patient with bilateral knee pain and foot pain I find no evidence of than some mild arthritis I recommend Tylenol when necessary  Pain patient relates some anxiety issues but not depressed recheck patient in 3-4 months

## 2015-02-11 ENCOUNTER — Ambulatory Visit
Admission: RE | Admit: 2015-02-11 | Discharge: 2015-02-11 | Disposition: A | Discharge status: Home / Self Care | Payer: Medicare Other | Source: Ambulatory Visit | Attending: Cardiothoracic Surgery | Admitting: Cardiothoracic Surgery

## 2015-02-11 ENCOUNTER — Ambulatory Visit (INDEPENDENT_AMBULATORY_CARE_PROVIDER_SITE_OTHER): Payer: Medicare Other | Admitting: Cardiothoracic Surgery

## 2015-02-11 VITALS — BP 130/80 | HR 96 | Resp 16 | Ht 70.0 in | Wt 238.0 lb

## 2015-02-11 DIAGNOSIS — Z712 Person consulting for explanation of examination or test findings: Secondary | ICD-10-CM

## 2015-02-11 DIAGNOSIS — R911 Solitary pulmonary nodule: Secondary | ICD-10-CM | POA: Diagnosis not present

## 2015-02-11 DIAGNOSIS — R05 Cough: Secondary | ICD-10-CM | POA: Diagnosis not present

## 2015-02-11 DIAGNOSIS — J984 Other disorders of lung: Secondary | ICD-10-CM | POA: Diagnosis not present

## 2015-02-11 DIAGNOSIS — Z7189 Other specified counseling: Secondary | ICD-10-CM

## 2015-02-11 DIAGNOSIS — Z853 Personal history of malignant neoplasm of breast: Secondary | ICD-10-CM | POA: Diagnosis not present

## 2015-02-11 NOTE — Progress Notes (Signed)
[address]       [place] [address]             [phone]       Nakyiah D Clyne [place] Medical Record #[id number] Date of Birth: 1960-10-27  Referring: Kathyrn Drown, MD Primary Care: Sallee Lange, MD  Chief Complaint:    Lung Nodule   History of Present Illness:    Patient is a 54 year old female with long-term history of smoking who had the incidental finding of the 74m nodule in the right middle lobe. She was  referred to thoracic surgery for evaluation of this and to develop a treatment plan in July 2013 . She's had no previous history of lung cancer. She does have a history of limited mental capacity and comes to the office today [place] She's had no history of tuberculosis or known tuberculosis exposure.  She does give a history of having a myocardial infarction in being hospitalized for 5 days at age [age]no other details about this revealed. She does note that at times especially with exertion she feels a pounding heart and chest discomfort with exertional shortness of breath. She denies resting shortness of breath.  She does give a history of having a right-sided stroke 5 years ago symptoms of this have resolved.     She continues to smoke, and  has since age [age]and to a significant  degree since age 54 she has decreased to one pack every 4 days    Current Activity/ Functional Status: Patient  is independent with mobility/ambulation, transfers, ADL's, IADL's. T     Past Medical History  Diagnosis Date  . Mental retardation   . Hyperglycemia   . HTN (hypertension)   . Migraines   . Anxiety   . IBS (irritable bowel syndrome)   . Diabetes mellitus   . Lung nodules     right, followed by PCP, PET 11/2011    Past Surgical History  Procedure Laterality Date  . Abdominal hysterectomy    . Foot surgery    . External ear surgery      bilateral  . Glaucoma surgery    . Colonoscopy  11/2007   hyperplastic polyps, prior hx of adenomas   . Small bowel capsule  10/2007    normal  . Esophagogastroduodenoscopy  08/2007    moderate sized hiatal hernia  . Colonoscopy  05/2010    incomplete due to poor prep, hyperplastic rectal polyp  . Esophagogastroduodenoscopy  05/2010    noncritical schatzki ring s/p 15F  . Colonoscopy  05/05/2002    Dimunitive polyps in the rectum and left colon, cold    biopsied/removed.  Scattered few left-sided diverticula.  Regular colonic   mucosa appeared normal  . Esophagogastroduodenoscopy (egd) with esophageal dilation N/A 02/06/2013    RKDT:OIZTIWesophagus-s/p dilation up to a 548FPakistansize with [facility] Care[facility]ators.  Hiatal hernia  . Colonoscopy N/A 05/28/2013    Procedure: COLONOSCOPY;  Surgeon: RDaneil Dolin MD;  Location: [facility];  Service: Endoscopy;  Laterality: N/A;  8:30    Family History  Problem Relation Age of Onset  . Colon cancer      aunt  . Liver disease Neg Hx   . Inflammatory bowel disease Neg Hx   . Stroke Mother   . Heart attack Father   . Obesity    . COPD    . GER disease    .  Diabetes type II    . Anxiety disorder    . Depression    . Depression Sister   . Schizophrenia Sister   . Schizophrenia Other   . Drug abuse Other   . Alcohol abuse Other       History  Smoking status  . Current Every Day Smoker -- 0.25 packs/day for 40 years  . Types: Cigarettes  Smokeless tobacco  . Never Used    History  Alcohol Use No   Says does not drink alcohol      Allergies  Allergen Reactions  . Thorazine [Chlorpromazine Hcl] Anaphylaxis  . Acetaminophen Other (See Comments)    Makes pt dizzy  . Aspirin Other (See Comments)    seizure  . Aspirin-Acetaminophen-Caffeine Other (See Comments)    seizure  . Nsaids Nausea And Vomiting  . Penicillins Nausea And Vomiting  . Tomato Rash    Current Outpatient Prescriptions  Medication Sig Dispense Refill  . buPROPion (WELLBUTRIN SR) 150 MG 12 hr tablet Take 1 tablet  (150 mg total) by mouth 2 (two) times daily. 60 tablet 2  . calcium-vitamin D (OSCAL WITH D) 500-200 MG-UNIT per tablet Take 1 tablet by mouth 3 (three) times daily.      Marland Kitchen dicyclomine (BENTYL) 10 MG capsule TAKE 1 CAPSULE 4 TIMES DAILY BEFORE MEALS AND AT BEDTIME. 120 capsule 0  . esomeprazole (NEXIUM) 40 MG capsule TAKE 1 CAPSULE BY MOUTH ONCE A DAY. 30 capsule 0  . lamoTRIgine (LAMICTAL) 100 MG tablet Take 1 tablet (100 mg total) by mouth 2 (two) times daily. 60 tablet 2  . lisinopril (PRINIVIL,ZESTRIL) 5 MG tablet TAKE (1) TABLET BY MOUTH ONCE DAILY. 30 tablet 5  . loratadine (CLARITIN) 10 MG tablet Take 1 tablet (10 mg total) by mouth daily. 30 tablet 5  . metFORMIN (GLUCOPHAGE) 500 MG tablet 1/2 tablet twice a day 90 tablet 3  . mupirocin ointment (BACTROBAN) 2 % Apply 1 application topically 2 (two) times daily.     . paliperidone (INVEGA) 9 MG 24 hr tablet Take 1 tablet (9 mg total) by mouth daily. 30 tablet 2  . polyethylene glycol (MIRALAX / GLYCOLAX) packet Take 17 g by mouth daily as needed for mild constipation.    . torsemide (DEMADEX) 20 MG tablet TAKE 2 TABLETS BY MOUTH EVERY MORNING. 60 tablet 5  . traZODone (DESYREL) 100 MG tablet Take 1 tablet (100 mg total) by mouth at bedtime. 30 tablet 2   No current facility-administered medications for this visit.       Review of Systems:     Cardiac Review of Systems: Y or N  Chest Pain [ y ]  Resting SOB [ n  ] Exertional SOB  Blue.Reese ]  Vertell Limber Florencio.Farrier ]   Pedal Edema Blue.Reese   ]    Palpitations Blue.Reese  ] Syncope  Blue.Reese  ]   Presyncope [ n  ]  General Review of Systems: [Y] = yes [  ]=no Constitional: recent weight change [ y ]; anorexia [  ]; fatigue [  ]; nausea [  ]; night sweats [  ]; fever [  ]; or chills [  ];  Dental: poor dentition[y  ];  Eye : blurred vision [  ]; diplopia [   ]; vision changes [  ];   Amaurosis fugax[  ]; Resp: cough [  ];  wheezing[  ];  hemoptysis[  ]; shortness of breath[  ]; paroxysmal nocturnal dyspnea[  ]; dyspnea on exertion[  ]; or orthopnea[  ];  GI:  gallstones[  ], vomiting[  ];  dysphagia[  ]; melena[  ];  hematochezia [  ]; heartburn[  ];   Hx of  Colonoscopy[  ]; GU: kidney stones [  ]; hematuria[  ];   dysuria [  ];  nocturia[  ];  history of     obstruction [  ];             Skin: rash, swelling[  ];, hair loss[  ];  peripheral edema[  ];  or itching[  ]; Musculosketetal: myalgias[  ];  joint swelling[  ];  joint erythema[  ];  joint pain[  ];  back pain[  ];  Heme/Lymph: bruising[  ];  bleeding[  ];  anemia[  ];  Neuro: TIA[  ];  headaches[  ];  stroke[y  ];  vertigo[y  ];  seizures[y  ];   paresthesias[ y ];  difficulty walking[ y ];  Psych:depression[ y ]; anxiety[ y ];  Endocrine: diabetes[  ];  thyroid dysfunction[n  ];  Immunizations: Flu [ ? ]; Pneumococcal[?  ];  Other: Patient has a very positive review of systems including weight gain loss of appetite chest pain chest tightness shortness of breath and lying flat palpitation shortness of breath with exertion heart murmur atrial fibrillation arrhythmia productive cough bronchitis asthma wheezing abdominal pain constipation reflux trouble swallowing pain in her legs with walking pain feet when lying flat temporary blindness in one eye dizziness blackouts headaches muscle pain joint pain arthritis depression nervousness change in eyesight and change in hearing.  Physical Exam: .BP 130/80 mmHg  Pulse 96  Resp 16  Ht '5\' 10"'$  (1.778 m)  Wt 238 lb (107.956 kg)  BMI 34.15 kg/m2  SpO2 98%   General appearance: alert, cooperative, appears older than stated age and no distress Neurologic: intact Heart: regular rate and rhythm, S1, S2 normal, no murmur, click, rub or gallop and normal apical impulse Lungs: clear to auscultation bilaterally and normal percussion bilaterally Abdomen: soft, non-tender; bowel  sounds normal; no masses,  no organomegaly Extremities: edema mild bil pedal edema and Homans sign is negative, no sign of DVT Wound:  Patient has no carotid bruits no cervical supraclavicular adenopathy is appreciated, especially in the area noted on that scan in the right axilla   Diagnostic Studies & Laboratory data:     Recent Radiology Findings:  Ct Chest Wo Contrast  02/11/2015   CLINICAL DATA:  Follow-up lung nodule. Mid left chest pain and productive cough. History of right breast cancer.  EXAM: CT CHEST WITHOUT CONTRAST  TECHNIQUE: Multidetector CT imaging of the chest was performed following the standard protocol without IV contrast.  COMPARISON:  Chest CT 01/27/2014. ; chest CT 01/15/2014 ; chest CT 11/01/2011.  FINDINGS: Mediastinum/Nodes: Visualized thyroid is unremarkable. No enlarged axillary, mediastinal or hilar lymphadenopathy. Normal heart size.  Lungs/Pleura: Central airways are patent. No consolidative pulmonary opacities. There is an 11 x 8 mm well-circumscribed nodule within the medial segment of the right middle lobe (image 38; series 4). This is unchanged from prior exam 01/27/2014 and minimally increased in size from CT 11/01/2011. No other suspicious  appearing pulmonary nodules or masses. No pleural effusion or pneumothorax.  Upper abdomen: Hepatic steatosis.  Musculoskeletal: No aggressive or acute appearing osseous lesions.  IMPRESSION: No significant interval change in size of oval circumscribed nodule within the medial segment right middle lobe. Given stability over time, this is favored to represent a benign process. As discussed on prior CT, an additional followup chest CT in 1-2 years is recommended to ensure continued stability.   Electronically Signed   By: Lovey Newcomer M.D.   On: 02/11/2015 11:10   Ct Chest Wo Contrast  01/15/2014   CLINICAL DATA:  Shortness of breath. Cough. Hemoptysis. History of smoking. Followup evaluation of lung nodule.  EXAM: CT CHEST WITHOUT  CONTRAST  TECHNIQUE: Multidetector CT imaging of the chest was performed following the standard protocol without IV contrast.  COMPARISON:  Chest CT 01/02/2013.  FINDINGS: Mediastinum: Heart size is normal. There is no significant pericardial fluid, thickening or pericardial calcification. There is atherosclerosis of the thoracic aorta, the great vessels of the mediastinum and the coronary arteries, including calcified atherosclerotic plaque in the left anterior descending coronary artery. No pathologically enlarged mediastinal or hilar lymph nodes. Please note that accurate exclusion of hilar adenopathy is limited on noncontrast CT scans. Esophagus is unremarkable in appearance.  Lungs/Pleura: 12 x 7 mm well-circumscribed nodule in the mid medial segment of the right middle lobe (image 38 of series 4) is only slightly larger than remote prior study 11/01/2011 at which point it measured 11 x 7 mm when measured in a similar fashion on image 34 of series 3 of that examination. No other suspicious appearing pulmonary nodules or masses. No acute consolidative airspace disease. No pleural effusions.  Upper Abdomen: Unremarkable.  Musculoskeletal: There are no aggressive appearing lytic or blastic lesions noted in the visualized portions of the skeleton.  IMPRESSION: 1. 12 x 7 mm well-circumscribed nodule in the mid medial segment of the right middle lobe (image 38 of series 4) is only slightly larger than remote prior study 11/01/2011 at which point it measured 11 x 7 mm when measured in a similar fashion on image 34 of series 3 of that examination. Given the relative stability over this 2 year time interval, this is strongly favored to represent a benign lesion, potentially a hammartoma (some hammartomas contain brown fat, which may account for low-level metabolic activity on the prior PET-CT). Another differential consideration would be in a low-grade carcinoid tumor. An additional noncontrast chest CT in 1-2 years is  suggested to ensure continued stability.   Electronically Signed   By: Vinnie Langton M.D.   On: 01/15/2014 13:24  Ct Chest Wo Contrast  01/02/2013   *RADIOLOGY REPORT*  Clinical Data: Right lung nodule.  History of breast cancer.  CT CHEST WITHOUT CONTRAST  Technique:  Multidetector CT imaging of the chest was performed following the standard protocol without IV contrast.  Comparison: Chest CT 04/22/2012.  Findings: The right middle lobe pulmonary nodule is unchanged. Measures 10.7 x 6.9 mm and previous measure 10.3 x 7.4 mm.  No new nodules are identified.  No acute pulmonary findings.  No pleural effusion.  The heart is normal in size.  No pericardial effusion.  No mediastinal or hilar lymphadenopathy.  Small scattered lymph nodes are stable.  The aorta is normal in caliber.  The esophagus is grossly normal.  The chest wall is unremarkable.  Stable calcifications of the right breast.  No supraclavicular or axillary adenopathy.  Stable bilateral axillary lymph nodes.  The  thyroid gland is normal.  The bony thorax is intact.  IMPRESSION:  Stable right middle lobe pulmonary nodule.  Recommend follow-up noncontrast chest CT in 1 year to document stability.   Original Report Authenticated By: Marijo Sanes, M.D.     Ct Chest W Contrast  04/22/2012  *RADIOLOGY REPORT*  Clinical Data: Follow-up pulmonary nodule.  CT CHEST WITH CONTRAST  Technique:  Multidetector CT imaging of the chest was performed following the standard protocol during bolus administration of intravenous contrast.  Contrast: 47m OMNIPAQUE IOHEXOL 300 MG/ML  SOLN  Comparison: PET 11/29/2011 and CT chest 11/01/2011.  Findings: No pathologically enlarged mediastinal, hilar or axillary lymph nodes.  Heart size normal.  No pericardial effusion.  A nodule in the right middle lobe measures 10 x 7 mm, stable from 11/01/2011 when remeasured.  A tiny subpleural nodular density in the right lower lobe measures 3 mm (image 42), stable.  No pleural  fluid.  Airway is unremarkable.  Incidental imaging of the upper abdomen shows low attenuation throughout the visualized portion of the liver.  No worrisome lytic or sclerotic lesions.  IMPRESSION: Right middle lobe nodule is unchanged from 11/01/2011.  Given mild hypermetabolism on 11/29/2011, a follow-up interval of 6 months is recommended to confirm continued stability.   Original Report Authenticated By: MLuretha Rued M.D.      11/09/2011 RADIOLOGY REPORT*  Clinical Data: Right basilar pulmonary nodule on abdominal CT.  Dysphagia. The patient has diabetes mellitus and unspecified  mental retardation.  CT CHEST WITH CONTRAST  Technique: Multidetector CT imaging of the chest was performed  following the standard protocol during bolus administration of  intravenous contrast.  Contrast: 881mOMNIPAQUE IOHEXOL 300 MG/ML SOLN  Comparison: Abdominal CTs 10/10/2011 and 01/19/2005. Chest  radiographs 10/23/2001.  Findings: As demonstrated on the recent abdominal CT, there is a  lobulated well circumscribed 10 x 9 mm right middle lobe nodule on  image 35. This is noncalcified. This area of the lung bases was  not imaged on the 2006 CT. No other pulmonary nodules are  identified. There is no airspace disease or confluent airspace  opacity.  There are no enlarged mediastinal or hilar lymph nodes. There is  no pleural or pericardial effusion. Minimal aortic atherosclerosis  is noted.  Images through the upper abdomen demonstrate diffuse hepatic  steatosis. There is no adrenal mass.  IMPRESSION:  1. Lobulated noncalcified 1-cm right middle lobe nodule was not  imaged on prior abdominal CT. This nodule is not conclusively  demonstrated on radiographs such that stability cannot be  addressed.  2. This nodule is nonspecific and could reflect a hamartoma or  other benign lesion. However, malignancy cannot be excluded.  3. No other nodules, adenopathy or acute findings.  This nodule would be  difficult to biopsy given its close proximity  to the heart. Management options include PET CT to assess for  hypermetabolic activity and follow-up CT (depending on risk factors  for malignancy). If follow-up is elected, follow-up chest CT at 3  months is recommended. This recommendation is adapted from the  consensus statement: Guidelines for Management of Small Pulmonary  Nodules Detected on CT Scans: A Statement from the [employer] published in Radiology 2005; 237:395-400.  Original Report Authenticated By: WIVivia EwingM.D.  JoHalina AndreasMD Tue Jan 09, 2012 2:52:39 PM EDT       **ADDENDUM** CREATED: 01/09/2012 14:43:25  Voice recognition errors:  Within the Findings section, the second sentence of the  CHEST  section should read: "NO additional hypermetabolic nodules are  present."  Within the first IMPRESSION, the last phrase of the first sentence  should read: "for a nodule this SIZE."  Findings discussed with Dr. Servando Snare on 07/ 08/2011  **END ADDENDUM** SIGNED BY: Suzy Bouchard, M.D.      Study Result     *RADIOLOGY REPORT*  Clinical Data: Recent imaging demonstrates a solitary pulmonary  nodule. FDG PET CT requested to evaluate for possible malignancy.  NUCLEAR MEDICINE PET SKULL BASE TO THIGH  Fasting Blood Glucose: 113  Technique: 18.4 mCi F-18 FDG was injected intravenously via the  right antecubital fossa. CT data was obtained and used for  attenuation correction and anatomic localization only. (This was  not acquired as a diagnostic CT examination.) Additional exam  technical data entered on technologist worksheet.  Comparison: CT 11/01/2011  Findings:  Head/Neck: No hypermetabolic lymph nodes in the neck.  Chest: Within the right middle lobe there is a 8 mm pulmonary  nodule adjacent to the right atrium with mild metabolic activity (  SUV max = 2.5). Additional hypermetabolic nodules are present. No  hypermetabolic mediastinal lymph  nodes.  There is a single hypermetabolic right axillary lymph node which is  normal pathology. There is misregistration in the right axilla due  to patient motion.  Abdomen/Pelvis: No abnormal hypermetabolic activity within the  liver, pancreas, adrenal glands, or spleen. No hypermetabolic  lymph nodes in the abdomen or pelvis.  Skeleton: No focal hypermetabolic activity to suggest skeletal  metastasis. Sclerosis of the left SI joint appears benign.  IMPRESSION:  1. Mildly hypermetabolic nodule within the right middle lobe.  Although the activity is mild, for a nodule this side this is  concerning although nonspecific. If biopsy is not feasible due to  location, recommend short-term follow-up CT with contrast in 1 to  3 months.  2. No evidence of metastasis.  3. Hypermetabolic right axial lymph node is likely reactive.  Original Report Authenticated By: Suzy Bouchard, M.D.       Recent Lab Findings: Lab Results  Component Value Date   WBC 4.0 01/27/2014   HGB 13.5 01/27/2014   HCT 39.4 01/27/2014   PLT 178 01/27/2014   GLUCOSE 91 01/13/2015   CHOL 154 07/20/2014   TRIG 105 07/20/2014   HDL 53 07/20/2014   LDLCALC 80 07/20/2014   ALT 14 05/01/2013   AST 19 05/01/2013   NA 142 01/13/2015   K 4.0 01/13/2015   CL 97 01/13/2015   CREATININE 0.78 01/13/2015   BUN 7 01/13/2015   CO2 29 01/13/2015   HGBA1C 6.3* 01/13/2015      Assessment / Plan:   Right middle lobe lung nodule-Given the relative stability over  3 year time interval, this is strongly favored to represent a benign lesion, potentially a hammartoma  Unfortunately the patient continues to smoke and in spite of attempts to have her stop it appears unlikely that she will be able to.  I have again reviewed with her the need to stop smoking and congratulated her on mark ly decreasing the amount  I plan to see the patient back in one year with a followup CT scan of the chest, will referr to [facility] Screening  clinic to get follow up low dose scans for lung cancer screening     Grace Isaac MD  [facility] [phone] 02/11/2015 12:54 PM

## 2015-02-12 ENCOUNTER — Encounter: Payer: Self-pay | Admitting: Cardiothoracic Surgery

## 2015-02-12 ENCOUNTER — Telehealth: Payer: Self-pay | Admitting: Family Medicine

## 2015-02-12 DIAGNOSIS — E1140 Type 2 diabetes mellitus with diabetic neuropathy, unspecified: Secondary | ICD-10-CM | POA: Diagnosis not present

## 2015-02-12 DIAGNOSIS — L602 Onychogryphosis: Secondary | ICD-10-CM | POA: Diagnosis not present

## 2015-02-12 DIAGNOSIS — L84 Corns and callosities: Secondary | ICD-10-CM | POA: Diagnosis not present

## 2015-02-12 NOTE — Telephone Encounter (Signed)
pts Dr from [employer] she needs a new pair of diabetic shoes  Can we send an order for this to [place]

## 2015-03-03 ENCOUNTER — Ambulatory Visit (HOSPITAL_COMMUNITY): Payer: Self-pay | Admitting: Psychiatry

## 2015-03-03 ENCOUNTER — Other Ambulatory Visit (HOSPITAL_COMMUNITY): Payer: Self-pay | Admitting: Psychiatry

## 2015-03-03 ENCOUNTER — Other Ambulatory Visit: Payer: Self-pay | Admitting: Family Medicine

## 2015-03-11 ENCOUNTER — Ambulatory Visit (INDEPENDENT_AMBULATORY_CARE_PROVIDER_SITE_OTHER): Payer: Medicare Other | Admitting: Psychiatry

## 2015-03-11 ENCOUNTER — Encounter (HOSPITAL_COMMUNITY): Payer: Self-pay | Admitting: Psychiatry

## 2015-03-11 VITALS — BP 122/70 | HR 81 | Ht 70.0 in | Wt 239.2 lb

## 2015-03-11 DIAGNOSIS — F251 Schizoaffective disorder, depressive type: Secondary | ICD-10-CM | POA: Diagnosis not present

## 2015-03-11 MED ORDER — PALIPERIDONE ER 9 MG PO TB24: 9.0000 mg | ORAL_TABLET | Freq: Every day | ORAL | Status: DC

## 2015-03-11 MED ORDER — LAMOTRIGINE 100 MG PO TABS: 100.0000 mg | ORAL_TABLET | Freq: Two times a day (BID) | ORAL | Status: DC

## 2015-03-11 MED ORDER — BUPROPION HCL ER (SR) 150 MG PO TB12: 150.0000 mg | ORAL_TABLET | Freq: Two times a day (BID) | ORAL | Status: DC

## 2015-03-11 MED ORDER — BENZTROPINE MESYLATE 1 MG PO TABS: 1.0000 mg | ORAL_TABLET | Freq: Every day | ORAL | Status: DC

## 2015-03-11 MED ORDER — TRAZODONE HCL 100 MG PO TABS: 100.0000 mg | ORAL_TABLET | Freq: Every day | ORAL | Status: DC
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
## 2015-03-27 ENCOUNTER — Other Ambulatory Visit: Payer: Self-pay | Admitting: Family Medicine

## 2015-04-11 NOTE — Telephone Encounter (Signed)
I am not against the patient having diabetic shoes but she would need a specific office visit for a diabetic foot exam. The patient can wait until her appointment in early November if she would like. (You can either talk with the patient or her social worker Nevis)

## 2015-04-12 NOTE — Telephone Encounter (Signed)
LMRC

## 2015-04-15 NOTE — Telephone Encounter (Signed)
Pt wants to come in sooner for the diabetic foot exam. Pt transferred to front to schedule visit.

## 2015-04-26 ENCOUNTER — Ambulatory Visit: Payer: Medicare Other | Admitting: Family Medicine

## 2015-04-28 ENCOUNTER — Other Ambulatory Visit (HOSPITAL_COMMUNITY): Payer: Self-pay | Admitting: Psychiatry

## 2015-05-10 ENCOUNTER — Telehealth: Payer: Self-pay | Admitting: Family Medicine

## 2015-05-10 DIAGNOSIS — Z79899 Other long term (current) drug therapy: Secondary | ICD-10-CM | POA: Diagnosis not present

## 2015-05-10 DIAGNOSIS — Z13220 Encounter for screening for lipoid disorders: Secondary | ICD-10-CM

## 2015-05-10 DIAGNOSIS — E119 Type 2 diabetes mellitus without complications: Secondary | ICD-10-CM | POA: Diagnosis not present

## 2015-05-10 DIAGNOSIS — I10 Essential (primary) hypertension: Secondary | ICD-10-CM

## 2015-05-10 DIAGNOSIS — R5383 Other fatigue: Secondary | ICD-10-CM

## 2015-05-10 NOTE — Telephone Encounter (Signed)
Pts DSS care taker is calling to get bw orders on Leaann so she can go over them  When she comes in for her appt this Wednesday.   Aware of [employer]    [phone] when Ready to go , Reyne Dumas

## 2015-05-10 NOTE — Telephone Encounter (Signed)
Bmp, a1c on 01/13/15 Lipid, microalb urine 07/20/14

## 2015-05-10 NOTE — Telephone Encounter (Signed)
Lipid/liv/met 7/ A1C/CBC/urine ACR 

## 2015-05-10 NOTE — Telephone Encounter (Signed)
Orders for bw put in. Pt notified.

## 2015-05-11 LAB — BASIC METABOLIC PANEL
BUN/Creatinine Ratio: 7 — ABNORMAL LOW (ref 9–23)
BUN: 5 mg/dL — ABNORMAL LOW (ref 6–24)
CHLORIDE: 101 mmol/L (ref 97–106)
CO2: 25 mmol/L (ref 18–29)
CREATININE: 0.7 mg/dL (ref 0.57–1.00)
Calcium: 8.9 mg/dL (ref 8.7–10.2)
GFR calc Af Amer: 115 mL/min/{1.73_m2} (ref >59)
GFR calc non Af Amer: 100 mL/min/{1.73_m2} (ref >59)
GLUCOSE: 87 mg/dL (ref 65–99)
POTASSIUM: 4 mmol/L (ref 3.5–5.2)
SODIUM: 141 mmol/L (ref 136–144)

## 2015-05-11 LAB — CBC WITH DIFFERENTIAL/PLATELET
BASOS: 0 %
Basophils Absolute: 0 10*3/uL (ref 0.0–0.2)
EOS (ABSOLUTE): 0 10*3/uL (ref 0.0–0.4)
EOS: 1 %
HEMATOCRIT: 35.4 % (ref 34.0–46.6)
HEMOGLOBIN: 12 g/dL (ref 11.1–15.9)
IMMATURE GRANS (ABS): 0 10*3/uL (ref 0.0–0.1)
Immature Granulocytes: 0 %
LYMPHS: 46 %
Lymphocytes Absolute: 2.2 10*3/uL (ref 0.7–3.1)
MCH: 29.7 pg (ref 26.6–33.0)
MCHC: 33.9 g/dL (ref 31.5–35.7)
MCV: 88 fL (ref 79–97)
MONOCYTES: 10 %
Monocytes Absolute: 0.5 10*3/uL (ref 0.1–0.9)
NEUTROS ABS: 2.1 10*3/uL (ref 1.4–7.0)
Neutrophils: 43 %
Platelets: 208 10*3/uL (ref 150–379)
RBC: 4.04 x10E6/uL (ref 3.77–5.28)
RDW: 14.1 % (ref 12.3–15.4)
WBC: 4.8 10*3/uL (ref 3.4–10.8)

## 2015-05-11 LAB — LIPID PANEL
Chol/HDL Ratio: 2.4 ratio units (ref 0.0–4.4)
Cholesterol, Total: 156 mg/dL (ref 100–199)
HDL: 64 mg/dL (ref >39)
LDL Calculated: 78 mg/dL (ref 0–99)
Triglycerides: 70 mg/dL (ref 0–149)
VLDL CHOLESTEROL CAL: 14 mg/dL (ref 5–40)

## 2015-05-11 LAB — HEMOGLOBIN A1C
ESTIMATED AVERAGE GLUCOSE: 134 mg/dL
HEMOGLOBIN A1C: 6.3 % — AB (ref 4.8–5.6)

## 2015-05-11 LAB — HEPATIC FUNCTION PANEL
ALK PHOS: 90 IU/L (ref 39–117)
ALT: 27 IU/L (ref 0–32)
AST: 28 IU/L (ref 0–40)
Albumin: 3.8 g/dL (ref 3.5–5.5)
Bilirubin Total: 0.2 mg/dL (ref 0.0–1.2)
Bilirubin, Direct: 0.07 mg/dL (ref 0.00–0.40)
TOTAL PROTEIN: 5.9 g/dL — AB (ref 6.0–8.5)

## 2015-05-11 LAB — MICROALBUMIN / CREATININE URINE RATIO
CREATININE, UR: 157.3 mg/dL
MICROALB/CREAT RATIO: 6.9 mg/g creat (ref 0.0–30.0)
Microalbumin, Urine: 10.9 ug/mL

## 2015-05-12 ENCOUNTER — Ambulatory Visit: Payer: Medicare Other | Admitting: Family Medicine

## 2015-05-21 ENCOUNTER — Other Ambulatory Visit (HOSPITAL_COMMUNITY): Payer: Self-pay | Admitting: Psychiatry

## 2015-06-09 ENCOUNTER — Encounter: Payer: Self-pay | Admitting: Family Medicine

## 2015-06-09 ENCOUNTER — Ambulatory Visit (INDEPENDENT_AMBULATORY_CARE_PROVIDER_SITE_OTHER): Payer: Medicare Other | Admitting: Family Medicine

## 2015-06-09 VITALS — BP 118/82 | Ht 70.0 in | Wt 237.6 lb

## 2015-06-09 DIAGNOSIS — K760 Fatty (change of) liver, not elsewhere classified: Secondary | ICD-10-CM

## 2015-06-09 DIAGNOSIS — E119 Type 2 diabetes mellitus without complications: Secondary | ICD-10-CM | POA: Diagnosis not present

## 2015-06-09 DIAGNOSIS — I10 Essential (primary) hypertension: Secondary | ICD-10-CM

## 2015-06-09 DIAGNOSIS — Z23 Encounter for immunization: Secondary | ICD-10-CM | POA: Diagnosis not present

## 2015-06-09 DIAGNOSIS — K219 Gastro-esophageal reflux disease without esophagitis: Secondary | ICD-10-CM

## 2015-06-09 DIAGNOSIS — E669 Obesity, unspecified: Secondary | ICD-10-CM | POA: Diagnosis not present

## 2015-06-09 NOTE — Progress Notes (Addendum)
   Subjective:    Patient ID: [name], female    DOB: 1961/04/01, 54 y.o.   MRN: [id number]  Diabetes She presents for her follow-up diabetic visit. She has type 2 diabetes mellitus. Pertinent negatives for hypoglycemia include no confusion. Pertinent negatives for diabetes include no chest pain, no fatigue, no polydipsia, no polyphagia and no weakness. Risk factors for coronary artery disease include diabetes mellitus, dyslipidemia, hypertension, obesity, post-menopausal and sedentary lifestyle. Current diabetic treatment includes diet. Her weight is stable. She is following a diabetic diet.   Discuss recent labs. The patient relates that she's had a lot of jerking in her muscles. States it happens intermittently. Wonders what it may be going on. States that her specialist put her on Cogentin recently.  She does try to watch her diet she does try to avoid excessive calories she has not been losing weight she does not walk at all on a regular basis or exercise. Patient does take her blood pressure medicine on a regular basis. Denies trouble Relates reflux under good control as long she takes her medicine  Patient has psychiatric illness followed by specialist Chronic swelling of the legs takes diuretic on a regular basis does well with this Glucophage patient tolerates well. Review of Systems  Constitutional: Negative for activity change, appetite change and fatigue.  HENT: Negative for congestion.   Respiratory: Negative for cough.   Cardiovascular: Negative for chest pain.  Gastrointestinal: Negative for abdominal pain.  Endocrine: Negative for polydipsia and polyphagia.  Neurological: Negative for weakness.  Psychiatric/Behavioral: Negative for confusion.       Objective:   Physical Exam  Constitutional: She appears well-nourished. No distress.  Cardiovascular: Normal rate, regular rhythm and normal heart sounds.   No murmur heard. Pulmonary/Chest: Effort normal and breath  sounds normal. No respiratory distress.  Musculoskeletal: She exhibits no edema.  Lymphadenopathy:    She has no cervical adenopathy.  Neurological: She is alert. She exhibits normal muscle tone.  Psychiatric: Her behavior is normal.  Vitals reviewed.         Assessment & Plan:  1. Encounter for immunization Flu vaccine today  2. Essential hypertension Blood pressure good control continue current measures  3. Gastroesophageal reflux disease without esophagitis Continue PPI without it she has trouble  4. Fatty liver Exercise watching diet losing weight recent lab work looks good  5. Controlled type 2 diabetes mellitus without complication, without long-term current use of insulin (HCC) Diabetes good control continue current measures  6. OBESITY Calorie restriction regular physical activity recommended  25 minutes was spent with the patient. Greater than half the time was spent in discussion and answering questions and counseling regarding the issues that the patient came in for today.  Addendum-diabetic foot exam was completed on this day. It was documented under the diabetic foot exam portion. Apparently this does not pronounced for the local pharmacy to be able to qualify therefore I will dictate it. A diabetic foot exam was completed on this day. Monofilament testing showed no peripheral neuropathy. Also showed she does have mild foot deformity with developing bunions and pre-ulcerative calluses. She should receive diabetic shoes and would qualify for these.

## 2015-06-10 ENCOUNTER — Other Ambulatory Visit: Payer: Self-pay

## 2015-06-10 NOTE — Progress Notes (Signed)
Called and spoke with pharmacy. Patient last had Nexium filled. Had Protonix Discontinued per Dr.Scott Luking in epic and with pharmacy.

## 2015-06-14 ENCOUNTER — Ambulatory Visit (HOSPITAL_COMMUNITY): Payer: Medicare Other | Admitting: Psychiatry

## 2015-06-22 ENCOUNTER — Telehealth (HOSPITAL_COMMUNITY): Payer: Self-pay | Admitting: *Deleted

## 2015-06-22 NOTE — Telephone Encounter (Signed)
You may refill these for 30 days and schedule f/u

## 2015-06-22 NOTE — Telephone Encounter (Signed)
Pt pharmacy requesting refills for pt Benztropine 1 mg QD, Trazodone 100 mg QHS, Paliperidone ER '9mg'$  QD, Lamotrigine 100 mg BID and Bupropion SR 150 mg BID. Pt pharmacy number is [phone]. All of pt medications were last filled 03-11-15. Pt no showed for last appt that was scheduled for 06-14-15 and do not have f/u appt.

## 2015-06-23 ENCOUNTER — Telehealth (HOSPITAL_COMMUNITY): Payer: Self-pay | Admitting: *Deleted

## 2015-06-26 MED ORDER — PALIPERIDONE ER 9 MG PO TB24: 9.0000 mg | ORAL_TABLET | Freq: Every day | ORAL | Status: DC

## 2015-06-26 MED ORDER — TRAZODONE HCL 100 MG PO TABS: 100.0000 mg | ORAL_TABLET | Freq: Every day | ORAL | Status: DC

## 2015-06-26 MED ORDER — LAMOTRIGINE 100 MG PO TABS: 100.0000 mg | ORAL_TABLET | Freq: Two times a day (BID) | ORAL | Status: DC

## 2015-06-26 MED ORDER — BENZTROPINE MESYLATE 1 MG PO TABS: 1.0000 mg | ORAL_TABLET | Freq: Every day | ORAL | Status: DC

## 2015-06-26 NOTE — Telephone Encounter (Signed)
Per Dr. Harrington Challenger to send in 1 months worth of script to pt pharmacy and to make f/u appt for pt. [name], pt case worker made appt for pt and showed understanding.

## 2015-06-28 NOTE — Telephone Encounter (Signed)
Medications sent to pt pharmacy

## 2015-07-08 ENCOUNTER — Encounter (HOSPITAL_COMMUNITY): Payer: Self-pay | Admitting: Psychiatry

## 2015-07-08 ENCOUNTER — Ambulatory Visit (INDEPENDENT_AMBULATORY_CARE_PROVIDER_SITE_OTHER): Payer: Medicare Other | Admitting: Psychiatry

## 2015-07-08 VITALS — BP 104/80 | HR 117 | Ht 70.0 in | Wt 238.0 lb

## 2015-07-08 DIAGNOSIS — F251 Schizoaffective disorder, depressive type: Secondary | ICD-10-CM

## 2015-07-08 MED ORDER — PROPRANOLOL HCL 10 MG PO TABS: 10.0000 mg | ORAL_TABLET | Freq: Three times a day (TID) | ORAL | Status: DC

## 2015-07-08 MED ORDER — TRAZODONE HCL 100 MG PO TABS: 100.0000 mg | ORAL_TABLET | Freq: Every day | ORAL | Status: DC

## 2015-07-08 MED ORDER — LAMOTRIGINE 100 MG PO TABS: 100.0000 mg | ORAL_TABLET | Freq: Two times a day (BID) | ORAL | Status: DC

## 2015-07-08 MED ORDER — PALIPERIDONE ER 9 MG PO TB24: 9.0000 mg | ORAL_TABLET | Freq: Every day | ORAL | Status: DC

## 2015-07-08 MED ORDER — SERTRALINE HCL 100 MG PO TABS: 100.0000 mg | ORAL_TABLET | Freq: Every day | ORAL | Status: DC
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
## 2015-07-08 NOTE — Patient Instructions (Signed)
Stop wellbutrin and benztropine

## 2015-07-28 ENCOUNTER — Telehealth: Payer: Self-pay | Admitting: Family Medicine

## 2015-07-28 NOTE — Telephone Encounter (Signed)
recertify SA in home funds, FL2 form needs to be filled out an sent back  As soon as you can

## 2015-08-01 NOTE — Telephone Encounter (Signed)
This was reviewed and signed-thank you

## 2015-08-02 NOTE — Telephone Encounter (Signed)
Faxed back by [facility]

## 2015-08-17 ENCOUNTER — Other Ambulatory Visit: Payer: Self-pay | Admitting: Family Medicine

## 2015-08-17 DIAGNOSIS — Z1231 Encounter for screening mammogram for malignant neoplasm of breast: Secondary | ICD-10-CM

## 2015-08-23 ENCOUNTER — Telehealth: Payer: Self-pay | Admitting: Family Medicine

## 2015-08-23 DIAGNOSIS — L84 Corns and callosities: Secondary | ICD-10-CM | POA: Diagnosis not present

## 2015-08-23 DIAGNOSIS — E1351 Other specified diabetes mellitus with diabetic peripheral angiopathy without gangrene: Secondary | ICD-10-CM | POA: Diagnosis not present

## 2015-08-23 DIAGNOSIS — L602 Onychogryphosis: Secondary | ICD-10-CM | POA: Diagnosis not present

## 2015-08-23 NOTE — Telephone Encounter (Signed)
Pt is needing a new pair of Diabetic shoes, please send script to [facility]

## 2015-08-24 ENCOUNTER — Other Ambulatory Visit (HOSPITAL_COMMUNITY): Payer: Self-pay | Admitting: Psychiatry

## 2015-08-24 ENCOUNTER — Telehealth: Payer: Self-pay | Admitting: Family Medicine

## 2015-08-24 NOTE — Telephone Encounter (Signed)
This will require a standard office visit specifically for diabetic foot exam and diabetic shoe prescription. Anywhere within the next 4 weeks is appropriate.

## 2015-08-24 NOTE — Telephone Encounter (Signed)
[facility] called stating patient is denied diabetic shoe. She hasnt had recent office visit and notes from 2016 are not acceptable.And needing more detail in office notes for insurance to cover shoe.

## 2015-08-24 NOTE — Telephone Encounter (Signed)
Prescription for diabetic shoes was written. The documentation from visit in October if printed under the encounter form does in fact have diabetic foot exam as part of the documentation please print/please highlight diabetic foot exam/please send a diabetic shoe prescription to [facility] thank you

## 2015-08-25 NOTE — Telephone Encounter (Signed)
Telephone call no answer 

## 2015-08-27 NOTE — Telephone Encounter (Signed)
Telephone call no answer 

## 2015-08-31 NOTE — Telephone Encounter (Signed)
Spoke with patient's case worker(Johnny Yow) and informed him per Dr.Scott Luking- this will require a standard office visit specifically for diabetic foot exam and diabetic shoe prescription. Patient's case worker verbalized understanding and stated he would call back to schedule appointment for diabetic foot exam.

## 2015-09-20 ENCOUNTER — Other Ambulatory Visit (HOSPITAL_COMMUNITY): Payer: Self-pay | Admitting: Psychiatry

## 2015-09-21 ENCOUNTER — Telehealth (HOSPITAL_COMMUNITY): Payer: Self-pay | Admitting: *Deleted

## 2015-09-21 ENCOUNTER — Other Ambulatory Visit (HOSPITAL_COMMUNITY): Payer: Self-pay | Admitting: Psychiatry

## 2015-09-21 MED ORDER — LAMOTRIGINE 100 MG PO TABS: 100.0000 mg | ORAL_TABLET | Freq: Two times a day (BID) | ORAL | Status: DC

## 2015-09-21 NOTE — Telephone Encounter (Signed)
Pt pharmacy requesting refills for pt Lamotrigine 100 mg BID. Pt medication last filled 07-08-15. Pt f/u appt is scheduled for 10-06-15. Pt pharmacy number is [phone]

## 2015-09-21 NOTE — Telephone Encounter (Signed)
noted 

## 2015-09-21 NOTE — Telephone Encounter (Signed)
sent 

## 2015-09-24 ENCOUNTER — Ambulatory Visit (HOSPITAL_COMMUNITY)
Admission: RE | Admit: 2015-09-24 | Discharge: 2015-09-24 | Disposition: A | Discharge status: Home / Self Care | Payer: Medicare Other | Source: Ambulatory Visit | Attending: Family Medicine | Admitting: Family Medicine

## 2015-09-24 DIAGNOSIS — R928 Other abnormal and inconclusive findings on diagnostic imaging of breast: Secondary | ICD-10-CM | POA: Insufficient documentation

## 2015-09-24 DIAGNOSIS — Z1231 Encounter for screening mammogram for malignant neoplasm of breast: Secondary | ICD-10-CM | POA: Diagnosis not present

## 2015-09-27 ENCOUNTER — Encounter: Payer: Self-pay | Admitting: Family Medicine

## 2015-09-27 ENCOUNTER — Ambulatory Visit (INDEPENDENT_AMBULATORY_CARE_PROVIDER_SITE_OTHER): Payer: Medicare Other | Admitting: Family Medicine

## 2015-09-27 VITALS — BP 122/78 | Ht 70.0 in | Wt 254.4 lb

## 2015-09-27 DIAGNOSIS — M25569 Pain in unspecified knee: Secondary | ICD-10-CM | POA: Diagnosis not present

## 2015-09-27 DIAGNOSIS — E119 Type 2 diabetes mellitus without complications: Secondary | ICD-10-CM

## 2015-09-27 DIAGNOSIS — N63 Unspecified lump in unspecified breast: Secondary | ICD-10-CM

## 2015-09-27 DIAGNOSIS — R7303 Prediabetes: Secondary | ICD-10-CM | POA: Diagnosis not present

## 2015-09-27 DIAGNOSIS — I10 Essential (primary) hypertension: Secondary | ICD-10-CM

## 2015-09-27 DIAGNOSIS — K760 Fatty (change of) liver, not elsewhere classified: Secondary | ICD-10-CM

## 2015-09-27 LAB — POCT GLYCOSYLATED HEMOGLOBIN (HGB A1C): Hemoglobin A1C: 5.9

## 2015-09-27 MED ORDER — DICLOFENAC SODIUM 75 MG PO TBEC: 75.0000 mg | DELAYED_RELEASE_TABLET | Freq: Two times a day (BID) | ORAL | Status: DC

## 2015-09-27 NOTE — Progress Notes (Signed)
   Subjective:    Patient ID: [name], female    DOB: 02/11/1961, 55 y.o.   MRN: [id number]  Diabetes She presents for her follow-up diabetic visit. She has type 2 diabetes mellitus. Pertinent negatives for hypoglycemia include no confusion. Pertinent negatives for diabetes include no chest pain, no fatigue, no polydipsia, no polyphagia and no weakness. Risk factors for coronary artery disease include diabetes mellitus, dyslipidemia, hypertension, obesity, post-menopausal and sedentary lifestyle. Current diabetic treatment includes diet. She is compliant with treatment all of the time. Her weight is stable. She is following a diabetic diet. She sees a podiatrist.Eye exam is not current.   arthritis painPatient states OTC measures no longer doing enough She states she does take her blood pressure medicine and diuretic on a regular basis tolerating it well She states she does use trazodone at night help her sleep She does follow-up with psychiatry She denies any other underlying health problems currently  Diabetic shoes- c [facility]      Review of Systems  Constitutional: Negative for activity change, appetite change and fatigue.  HENT: Negative for congestion.   Respiratory: Negative for cough.   Cardiovascular: Negative for chest pain.  Gastrointestinal: Negative for abdominal pain.  Endocrine: Negative for polydipsia and polyphagia.  Musculoskeletal: Positive for back pain and arthralgias.  Neurological: Negative for weakness.  Psychiatric/Behavioral: Negative for confusion.       Objective:   Physical Exam  Constitutional: She appears well-nourished. No distress.  Cardiovascular: Normal rate, regular rhythm and normal heart sounds.   No murmur heard. Pulmonary/Chest: Effort normal and breath sounds normal. No respiratory distress.  Musculoskeletal: She exhibits no edema.  Lymphadenopathy:    She has no cervical adenopathy.  Neurological: She is alert. She exhibits normal  muscle tone.  Psychiatric: Her behavior is normal.  Vitals reviewed.  Diabetic foot exam shows significant bunions also dry skin. And neuropathy. Also pre-ulcerative calluses.        Assessment & Plan:  Patient is going to be getting diabetic shoes from [facility] Right breast growth-await mammogram and ultrasound Diabetes good control currently watch diet stay active Obesity patient to watch diet try to lose weight Arthritis diclofenac twice a day when necessary do not take OTC medicines with it Pedal edema stable continue current medication Demadex and potassium Patient recheck here in a proximally 4 months Patient states that she can tolerate NSAIDs

## 2015-09-29 DIAGNOSIS — L84 Corns and callosities: Secondary | ICD-10-CM | POA: Diagnosis not present

## 2015-09-29 DIAGNOSIS — E119 Type 2 diabetes mellitus without complications: Secondary | ICD-10-CM | POA: Diagnosis not present

## 2015-09-29 DIAGNOSIS — M2010 Hallux valgus (acquired), unspecified foot: Secondary | ICD-10-CM | POA: Diagnosis not present

## 2015-10-04 ENCOUNTER — Other Ambulatory Visit: Payer: Self-pay | Admitting: Family Medicine

## 2015-10-04 DIAGNOSIS — N63 Unspecified lump in unspecified breast: Secondary | ICD-10-CM

## 2015-10-05 ENCOUNTER — Encounter (HOSPITAL_COMMUNITY): Payer: Self-pay

## 2015-10-05 ENCOUNTER — Telehealth: Payer: Self-pay | Admitting: Family Medicine

## 2015-10-05 ENCOUNTER — Ambulatory Visit (HOSPITAL_COMMUNITY)
Admission: RE | Admit: 2015-10-05 | Discharge: 2015-10-05 | Disposition: A | Discharge status: Home / Self Care | Payer: Medicare Other | Source: Ambulatory Visit | Attending: Family Medicine | Admitting: Family Medicine

## 2015-10-05 ENCOUNTER — Ambulatory Visit (INDEPENDENT_AMBULATORY_CARE_PROVIDER_SITE_OTHER): Payer: Medicare Other | Admitting: Family Medicine

## 2015-10-05 ENCOUNTER — Encounter: Payer: Self-pay | Admitting: Family Medicine

## 2015-10-05 VITALS — BP 110/76 | Temp 98.5°F | Ht 70.0 in | Wt 260.1 lb

## 2015-10-05 DIAGNOSIS — A084 Viral intestinal infection, unspecified: Secondary | ICD-10-CM

## 2015-10-05 DIAGNOSIS — N63 Unspecified lump in unspecified breast: Secondary | ICD-10-CM

## 2015-10-05 DIAGNOSIS — N6001 Solitary cyst of right breast: Secondary | ICD-10-CM | POA: Diagnosis not present

## 2015-10-05 MED ORDER — ONDANSETRON 4 MG PO TBDP: 4.0000 mg | ORAL_TABLET | Freq: Three times a day (TID) | ORAL | Status: DC | PRN

## 2015-10-05 NOTE — Progress Notes (Signed)
   Subjective:    Patient ID: [name], female    DOB: 27-May-1961, 55 y.o.   MRN: [id number]  Abdominal Pain This is a new problem. The current episode started yesterday. The pain is located in the generalized abdominal region. Associated symptoms include diarrhea, headaches, nausea and vomiting. Associated symptoms comments: Ear Pain. She has tried nothing for the symptoms.   Patient has concerns of possible kidney issues. Patient notes low back pain intermittent in nature. Worse with motions    impressive amount of vomiting couple days ago. None does far today. Still some residual nausea. Also frequent loose stools.     Review of Systems  Gastrointestinal: Positive for nausea, vomiting, abdominal pain and diarrhea.  Neurological: Positive for headaches.       Objective:   Physical Exam   alert no acute distress vital stable HEENT normal. Lungs clear. Heart regular in rhythm. Abdomen hyperactive bowel sounds no discrete tenderness no CVA tenderness      Assessment & Plan:   impression acute viral gastroenteritis with substantial vomiting diarrhea improving #2 back pain musculoskeletal not  Renal rationale discussed plan Zofran when necessary. Symptom care discussed warning signs discussed WSL

## 2015-10-05 NOTE — Telephone Encounter (Signed)
error 

## 2015-10-06 ENCOUNTER — Ambulatory Visit (INDEPENDENT_AMBULATORY_CARE_PROVIDER_SITE_OTHER): Payer: Medicare Other | Admitting: Psychiatry

## 2015-10-06 ENCOUNTER — Encounter (HOSPITAL_COMMUNITY): Payer: Self-pay | Admitting: Psychiatry

## 2015-10-06 VITALS — BP 102/58 | HR 70 | Ht 70.0 in | Wt 257.0 lb

## 2015-10-06 DIAGNOSIS — F251 Schizoaffective disorder, depressive type: Secondary | ICD-10-CM | POA: Diagnosis not present

## 2015-10-06 MED ORDER — SERTRALINE HCL 100 MG PO TABS: 100.0000 mg | ORAL_TABLET | Freq: Every day | ORAL | Status: DC

## 2015-10-06 MED ORDER — PROPRANOLOL HCL 10 MG PO TABS: 10.0000 mg | ORAL_TABLET | Freq: Three times a day (TID) | ORAL | Status: DC

## 2015-10-06 MED ORDER — LAMOTRIGINE 100 MG PO TABS: 100.0000 mg | ORAL_TABLET | Freq: Two times a day (BID) | ORAL | Status: DC

## 2015-10-06 MED ORDER — TRAZODONE HCL 100 MG PO TABS: 100.0000 mg | ORAL_TABLET | Freq: Every day | ORAL | Status: DC

## 2015-10-06 MED ORDER — PALIPERIDONE ER 9 MG PO TB24: 9.0000 mg | ORAL_TABLET | Freq: Every day | ORAL | Status: DC
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
## 2015-10-13 ENCOUNTER — Other Ambulatory Visit: Payer: Self-pay | Admitting: Family Medicine

## 2015-11-02 DIAGNOSIS — L84 Corns and callosities: Secondary | ICD-10-CM | POA: Diagnosis not present

## 2015-11-02 DIAGNOSIS — E1351 Other specified diabetes mellitus with diabetic peripheral angiopathy without gangrene: Secondary | ICD-10-CM | POA: Diagnosis not present

## 2015-11-02 DIAGNOSIS — L602 Onychogryphosis: Secondary | ICD-10-CM | POA: Diagnosis not present

## 2015-11-26 ENCOUNTER — Other Ambulatory Visit: Payer: Self-pay | Admitting: Family Medicine

## 2015-11-26 ENCOUNTER — Other Ambulatory Visit (HOSPITAL_COMMUNITY): Payer: Self-pay | Admitting: Psychiatry

## 2015-12-14 ENCOUNTER — Other Ambulatory Visit (HOSPITAL_COMMUNITY): Payer: Self-pay | Admitting: Psychiatry

## 2015-12-21 ENCOUNTER — Other Ambulatory Visit: Payer: Self-pay | Admitting: *Deleted

## 2015-12-21 ENCOUNTER — Other Ambulatory Visit: Payer: Self-pay | Admitting: Family Medicine

## 2015-12-21 MED ORDER — DICLOFENAC SODIUM 75 MG PO TBEC: DELAYED_RELEASE_TABLET | ORAL | Status: DC

## 2016-01-03 ENCOUNTER — Other Ambulatory Visit (HOSPITAL_COMMUNITY): Payer: Self-pay | Admitting: Psychiatry

## 2016-01-06 ENCOUNTER — Ambulatory Visit (INDEPENDENT_AMBULATORY_CARE_PROVIDER_SITE_OTHER): Payer: Medicare Other | Admitting: Psychiatry

## 2016-01-06 ENCOUNTER — Encounter (HOSPITAL_COMMUNITY): Payer: Self-pay | Admitting: Psychiatry

## 2016-01-06 ENCOUNTER — Telehealth (HOSPITAL_COMMUNITY): Payer: Self-pay | Admitting: *Deleted

## 2016-01-06 ENCOUNTER — Other Ambulatory Visit: Payer: Self-pay | Admitting: *Deleted

## 2016-01-06 VITALS — BP 110/52 | HR 82 | Ht 70.0 in | Wt 256.4 lb

## 2016-01-06 DIAGNOSIS — F251 Schizoaffective disorder, depressive type: Secondary | ICD-10-CM | POA: Diagnosis not present

## 2016-01-06 MED ORDER — SERTRALINE HCL 100 MG PO TABS: 100.0000 mg | ORAL_TABLET | Freq: Every day | ORAL | Status: DC

## 2016-01-06 MED ORDER — PROPRANOLOL HCL 10 MG PO TABS
10.0000 mg | ORAL_TABLET | Freq: Three times a day (TID) | ORAL | Status: DC
Start: 2016-01-06 — End: 2016-04-05

## 2016-01-06 MED ORDER — TRAZODONE HCL 100 MG PO TABS: 100.0000 mg | ORAL_TABLET | Freq: Every day | ORAL | Status: DC

## 2016-01-06 MED ORDER — BENZTROPINE MESYLATE 1 MG PO TABS: 1.0000 mg | ORAL_TABLET | Freq: Every day | ORAL | Status: DC

## 2016-01-06 MED ORDER — METFORMIN HCL 500 MG PO TABS: ORAL_TABLET | ORAL | Status: DC

## 2016-01-06 MED ORDER — LAMOTRIGINE 100 MG PO TABS
100.0000 mg | ORAL_TABLET | Freq: Two times a day (BID) | ORAL | Status: DC
Start: 2016-01-06 — End: 2016-04-05

## 2016-01-06 MED ORDER — PALIPERIDONE ER 9 MG PO TB24: 9.0000 mg | ORAL_TABLET | Freq: Every day | ORAL | Status: DC
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
## 2016-01-06 NOTE — Telephone Encounter (Signed)
Per Dr. Harrington Challenger, she noticed that pt is not taking Metformin and Nexium. Per Dr. Harrington Challenger, pt is on [place] and with her not being on metformin, it will affect her glucose levels and to call pharmacy to find out why pt medications just stopped. Per dr. Harrington Challenger, she do not see anything in Dr. Wolfgang Phoenix notes that indicated that pt needed to come off her Metformin. Called pt pharmacy [facility] and spoke with Ovid Curd. Per Ovid Curd, pt Metformin was last filled in Sep 2016 and her Nexium was also last filled Sept 2016. Asked Ovid Curd if he could send a refill request for both medications to Dr. Wolfgang Phoenix office to see if they could approve medications. Per Ovid Curd, he do not know why pt medications just stopped in September but he will send request to Dr. Wolfgang Phoenix office.

## 2016-01-17 ENCOUNTER — Other Ambulatory Visit (HOSPITAL_COMMUNITY): Payer: Self-pay | Admitting: Psychiatry

## 2016-01-24 ENCOUNTER — Other Ambulatory Visit (HOSPITAL_COMMUNITY): Payer: Self-pay | Admitting: Psychiatry

## 2016-01-26 DIAGNOSIS — E1351 Other specified diabetes mellitus with diabetic peripheral angiopathy without gangrene: Secondary | ICD-10-CM | POA: Diagnosis not present

## 2016-01-26 DIAGNOSIS — L84 Corns and callosities: Secondary | ICD-10-CM | POA: Diagnosis not present

## 2016-01-26 DIAGNOSIS — L602 Onychogryphosis: Secondary | ICD-10-CM | POA: Diagnosis not present

## 2016-01-27 ENCOUNTER — Encounter: Payer: Self-pay | Admitting: Family Medicine

## 2016-01-27 ENCOUNTER — Ambulatory Visit (INDEPENDENT_AMBULATORY_CARE_PROVIDER_SITE_OTHER): Payer: Medicare Other | Admitting: Family Medicine

## 2016-01-27 ENCOUNTER — Ambulatory Visit: Payer: Medicare Other | Admitting: Family Medicine

## 2016-01-27 VITALS — BP 118/74 | Temp 98.0°F | Ht 70.5 in | Wt 260.0 lb

## 2016-01-27 DIAGNOSIS — E876 Hypokalemia: Secondary | ICD-10-CM | POA: Diagnosis not present

## 2016-01-27 DIAGNOSIS — I10 Essential (primary) hypertension: Secondary | ICD-10-CM | POA: Diagnosis not present

## 2016-01-27 DIAGNOSIS — E119 Type 2 diabetes mellitus without complications: Secondary | ICD-10-CM

## 2016-01-27 DIAGNOSIS — K760 Fatty (change of) liver, not elsewhere classified: Secondary | ICD-10-CM | POA: Diagnosis not present

## 2016-01-27 LAB — POCT GLYCOSYLATED HEMOGLOBIN (HGB A1C): HEMOGLOBIN A1C: 5.7

## 2016-01-27 NOTE — Progress Notes (Signed)
   Subjective:    Patient ID: [name], female    DOB: 04/07/1961, 55 y.o.   MRN: [id number]  Diabetes She presents for her follow-up diabetic visit. She has type 2 diabetes mellitus. Exercise: walks daily. Home blood sugar record trend: pt has not been checking blood sugar. machine is broke. She sees a podiatrist.Eye exam is current.  pt needs rx for new glucose monitor. Pt has been taking metformin wrong. Taking one bid instead of one half bid.  A1C 5.7.   Abdominal pain and cramping, diarrhea for 2 months.  No bloody stools. Has been seen by gastroenterology on a regular basis.  Bilateral leg pain for 6 months.  Intermittent leg pain mainly in her knees takes Tylenol when necessary also takes diclofenac. Does try to do some walking.  Headache for 2 days. Constant headache, sharp pain.   Low back pain for a couple of months. Subjected discomfort does not radiate down the legs.   Unsteady gait. Has had some falls in the past couple of months. Requesting rx for a straight cane. Pt not like the one she has.    patient highly symptomatic patient  Review of Systems     Patient finds that she is having back pain and knee pain for pain she also relates at times head congestion drainage at times coughing denies hemoptysis Objective:   Physical Exam  lungs clear hearts regular osteoarthritis noted in her knees significant bunions in the feet with poor sensation in calluses some pre-ulcerative extremities trace edema neurologic grossly normal patient does have significant psychiatric illness she walks with a 4 pronged cane mild ataxia       Assessment & Plan:   she may use a standard cane Diabetes actually A1c looks great metformin   encouraged patient exercise and lose weight Blood pressure good control  continue following with mental health  history hyperlipidemia previous labs reviewed with patient and social worker check lab work History of anemia check CBC previous labs  reviewed with patient 25 minutes was spent with the patient. Greater than half the time was spent in discussion and answering questions and counseling regarding the issues that the patient came in for today.

## 2016-02-02 ENCOUNTER — Telehealth: Payer: Self-pay | Admitting: Family Medicine

## 2016-02-02 NOTE — Telephone Encounter (Signed)
For some reason I thought we wrote that prescription after that visit and had sent to her helper Johnny yow-nonetheless go ahead and write a prescription for a single prong cane in send it to social services or the pharmacy as per requested

## 2016-02-02 NOTE — Telephone Encounter (Signed)
Pt was supposed to get a script for a cane at there last visit, can we right one and send it to [facility]   She has a four pronged one but has issues trying to use it, was told by the Doc that he would try to order a single cane   Please advise   [phone] please call her social worker  [name]

## 2016-02-03 DIAGNOSIS — R270 Ataxia, unspecified: Secondary | ICD-10-CM | POA: Diagnosis not present

## 2016-02-03 NOTE — Telephone Encounter (Signed)
Left message for social worker

## 2016-02-03 NOTE — Telephone Encounter (Signed)
Reviewed fax from last week and prescription was written on 01/27/16. Refaxed script to [facility]

## 2016-02-04 DIAGNOSIS — I10 Essential (primary) hypertension: Secondary | ICD-10-CM | POA: Diagnosis not present

## 2016-02-04 DIAGNOSIS — K760 Fatty (change of) liver, not elsewhere classified: Secondary | ICD-10-CM | POA: Diagnosis not present

## 2016-02-04 DIAGNOSIS — E119 Type 2 diabetes mellitus without complications: Secondary | ICD-10-CM | POA: Diagnosis not present

## 2016-02-04 DIAGNOSIS — E876 Hypokalemia: Secondary | ICD-10-CM | POA: Diagnosis not present

## 2016-02-05 LAB — BASIC METABOLIC PANEL
BUN/Creatinine Ratio: 9 (ref 9–23)
BUN: 8 mg/dL (ref 6–24)
CHLORIDE: 98 mmol/L (ref 96–106)
CO2: 28 mmol/L (ref 18–29)
Calcium: 8.9 mg/dL (ref 8.7–10.2)
Creatinine, Ser: 0.86 mg/dL (ref 0.57–1.00)
GFR calc non Af Amer: 77 mL/min/{1.73_m2} (ref >59)
GFR, EST AFRICAN AMERICAN: 89 mL/min/{1.73_m2} (ref >59)
Glucose: 91 mg/dL (ref 65–99)
POTASSIUM: 4 mmol/L (ref 3.5–5.2)
SODIUM: 138 mmol/L (ref 134–144)

## 2016-02-05 LAB — CBC WITH DIFFERENTIAL/PLATELET
BASOS ABS: 0 10*3/uL (ref 0.0–0.2)
Basos: 0 %
EOS (ABSOLUTE): 0.1 10*3/uL (ref 0.0–0.4)
Eos: 2 %
HEMOGLOBIN: 12.1 g/dL (ref 11.1–15.9)
Hematocrit: 35.8 % (ref 34.0–46.6)
Immature Grans (Abs): 0 10*3/uL (ref 0.0–0.1)
Immature Granulocytes: 0 %
LYMPHS ABS: 2.2 10*3/uL (ref 0.7–3.1)
Lymphs: 45 %
MCH: 30.3 pg (ref 26.6–33.0)
MCHC: 33.8 g/dL (ref 31.5–35.7)
MCV: 90 fL (ref 79–97)
Monocytes Absolute: 0.5 10*3/uL (ref 0.1–0.9)
Monocytes: 11 %
NEUTROS ABS: 2 10*3/uL (ref 1.4–7.0)
Neutrophils: 42 %
PLATELETS: 205 10*3/uL (ref 150–379)
RBC: 4 x10E6/uL (ref 3.77–5.28)
RDW: 14.6 % (ref 12.3–15.4)
WBC: 4.8 10*3/uL (ref 3.4–10.8)

## 2016-02-05 LAB — LIPID PANEL
CHOLESTEROL TOTAL: 156 mg/dL (ref 100–199)
Chol/HDL Ratio: 2.6 ratio units (ref 0.0–4.4)
HDL: 61 mg/dL (ref >39)
LDL Calculated: 76 mg/dL (ref 0–99)
TRIGLYCERIDES: 95 mg/dL (ref 0–149)
VLDL Cholesterol Cal: 19 mg/dL (ref 5–40)

## 2016-02-05 LAB — HEPATIC FUNCTION PANEL
ALT: 21 IU/L (ref 0–32)
AST: 25 IU/L (ref 0–40)
Albumin: 4.1 g/dL (ref 3.5–5.5)
Alkaline Phosphatase: 84 IU/L (ref 39–117)
BILIRUBIN TOTAL: 0.2 mg/dL (ref 0.0–1.2)
Bilirubin, Direct: 0.08 mg/dL (ref 0.00–0.40)
Total Protein: 6.5 g/dL (ref 6.0–8.5)

## 2016-02-05 LAB — MICROALBUMIN / CREATININE URINE RATIO
CREATININE, UR: 144.7 mg/dL
MICROALB/CREAT RATIO: 5.7 mg/g creat (ref 0.0–30.0)
Microalbumin, Urine: 8.2 ug/mL

## 2016-02-07 NOTE — Telephone Encounter (Signed)
Social worker notified

## 2016-02-17 ENCOUNTER — Ambulatory Visit: Payer: Medicare Other | Admitting: Cardiothoracic Surgery

## 2016-02-29 ENCOUNTER — Other Ambulatory Visit: Payer: Self-pay | Admitting: Family Medicine

## 2016-02-29 ENCOUNTER — Other Ambulatory Visit (HOSPITAL_COMMUNITY): Payer: Self-pay | Admitting: Psychiatry

## 2016-02-29 NOTE — Telephone Encounter (Signed)
May have this +4 refills 

## 2016-03-14 ENCOUNTER — Other Ambulatory Visit: Payer: Self-pay | Admitting: Family Medicine

## 2016-03-14 ENCOUNTER — Other Ambulatory Visit (HOSPITAL_COMMUNITY): Payer: Self-pay | Admitting: Psychiatry

## 2016-03-16 ENCOUNTER — Ambulatory Visit: Payer: Medicare Other | Admitting: Cardiothoracic Surgery

## 2016-03-16 ENCOUNTER — Other Ambulatory Visit: Payer: Self-pay | Admitting: *Deleted

## 2016-03-16 DIAGNOSIS — R911 Solitary pulmonary nodule: Secondary | ICD-10-CM

## 2016-03-29 DIAGNOSIS — L602 Onychogryphosis: Secondary | ICD-10-CM | POA: Diagnosis not present

## 2016-03-29 DIAGNOSIS — L84 Corns and callosities: Secondary | ICD-10-CM | POA: Diagnosis not present

## 2016-03-29 DIAGNOSIS — E1351 Other specified diabetes mellitus with diabetic peripheral angiopathy without gangrene: Secondary | ICD-10-CM | POA: Diagnosis not present

## 2016-04-05 ENCOUNTER — Ambulatory Visit (INDEPENDENT_AMBULATORY_CARE_PROVIDER_SITE_OTHER): Payer: Medicare Other | Admitting: Psychiatry

## 2016-04-05 ENCOUNTER — Encounter (HOSPITAL_COMMUNITY): Payer: Self-pay | Admitting: Psychiatry

## 2016-04-05 ENCOUNTER — Encounter: Payer: Self-pay | Admitting: Family Medicine

## 2016-04-05 VITALS — BP 113/72 | HR 86 | Ht 70.5 in | Wt 265.0 lb

## 2016-04-05 DIAGNOSIS — H5203 Hypermetropia, bilateral: Secondary | ICD-10-CM | POA: Diagnosis not present

## 2016-04-05 DIAGNOSIS — E119 Type 2 diabetes mellitus without complications: Secondary | ICD-10-CM | POA: Diagnosis not present

## 2016-04-05 DIAGNOSIS — F251 Schizoaffective disorder, depressive type: Secondary | ICD-10-CM

## 2016-04-05 DIAGNOSIS — H52223 Regular astigmatism, bilateral: Secondary | ICD-10-CM | POA: Diagnosis not present

## 2016-04-05 DIAGNOSIS — H524 Presbyopia: Secondary | ICD-10-CM | POA: Diagnosis not present

## 2016-04-05 LAB — HM DIABETES EYE EXAM

## 2016-04-05 MED ORDER — BENZTROPINE MESYLATE 1 MG PO TABS: 1.0000 mg | ORAL_TABLET | Freq: Every day | ORAL | 2 refills | Status: DC

## 2016-04-05 MED ORDER — TRAZODONE HCL 100 MG PO TABS: 100.0000 mg | ORAL_TABLET | Freq: Every day | ORAL | 2 refills | Status: DC

## 2016-04-05 MED ORDER — PALIPERIDONE ER 9 MG PO TB24: 9.0000 mg | ORAL_TABLET | Freq: Every day | ORAL | 2 refills | Status: DC

## 2016-04-05 MED ORDER — PROPRANOLOL HCL 10 MG PO TABS: 10.0000 mg | ORAL_TABLET | Freq: Three times a day (TID) | ORAL | 2 refills | Status: DC

## 2016-04-05 MED ORDER — LAMOTRIGINE 100 MG PO TABS: 100.0000 mg | ORAL_TABLET | Freq: Two times a day (BID) | ORAL | 2 refills | Status: DC

## 2016-04-05 MED ORDER — SERTRALINE HCL 100 MG PO TABS: 100.0000 mg | ORAL_TABLET | Freq: Every day | ORAL | 2 refills | Status: DC
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
## 2016-04-20 ENCOUNTER — Encounter: Payer: Self-pay | Admitting: Cardiothoracic Surgery

## 2016-04-20 ENCOUNTER — Ambulatory Visit (INDEPENDENT_AMBULATORY_CARE_PROVIDER_SITE_OTHER): Payer: Medicare Other | Admitting: Cardiothoracic Surgery

## 2016-04-20 ENCOUNTER — Ambulatory Visit
Admission: RE | Admit: 2016-04-20 | Discharge: 2016-04-20 | Disposition: A | Discharge status: Home / Self Care | Payer: Medicare Other | Source: Ambulatory Visit | Attending: Cardiothoracic Surgery | Admitting: Cardiothoracic Surgery

## 2016-04-20 VITALS — BP 112/75 | HR 84 | Resp 16 | Ht 70.5 in | Wt 266.6 lb

## 2016-04-20 DIAGNOSIS — Z7189 Other specified counseling: Secondary | ICD-10-CM

## 2016-04-20 DIAGNOSIS — R918 Other nonspecific abnormal finding of lung field: Secondary | ICD-10-CM | POA: Diagnosis not present

## 2016-04-20 DIAGNOSIS — R911 Solitary pulmonary nodule: Secondary | ICD-10-CM

## 2016-04-20 DIAGNOSIS — Z712 Person consulting for explanation of examination or test findings: Secondary | ICD-10-CM

## 2016-04-20 NOTE — Progress Notes (Signed)
[address]       [place] [address]             [phone]       [name]  Medical Record #[id number] Date of Birth: 1960/09/22  Referring: Kathyrn Drown, MD Primary Care: Sallee Lange, MD  Chief Complaint:    Lung Nodule   History of Present Illness:    Patient is a 55 year old female with long-term history of smoking who had the incidental finding of the 14m nodule in the right middle lobe. She was  referred to thoracic surgery for evaluation of this and to develop a treatment plan in July 2013 . She's had no previous history of lung cancer. She does have a history of limited mental capacity and comes to the office today with [place] social [place] She's had no history of tuberculosis or known tuberculosis exposure.  She does give a history of having a myocardial infarction in being hospitalized for 5 days at age [age]no other details about this revealed. She does note that at times especially with exertion she feels a pounding heart and chest discomfort with exertional shortness of breath. She denies resting shortness of breath.  She does give a history of having a right-sided stroke in the past.     She continues to smoke, and  has since age [age]and to a significant  degree since age 89    Current Activity/ Functional Status: Patient  is independent with mobility/ambulation, transfers, ADL's, IADL's. T     Past Medical History  Diagnosis Date  . Mental retardation   . Hyperglycemia   . HTN (hypertension)   . Migraines   . Anxiety   . IBS (irritable bowel syndrome)   . Diabetes mellitus   . Lung nodules     right, followed by PCP, PET 11/2011    Past Surgical History:  Procedure Laterality Date  . ABDOMINAL HYSTERECTOMY    . COLONOSCOPY  11/2007   hyperplastic polyps, prior hx of adenomas   . COLONOSCOPY  05/2010   incomplete due to poor prep, hyperplastic rectal polyp  . COLONOSCOPY  05/05/2002   Dimunitive polyps in the rectum and left colon, cold    biopsied/removed.  Scattered few left-sided diverticula.  Regular colonic   mucosa appeared normal  . COLONOSCOPY N/A 05/28/2013   Procedure: COLONOSCOPY;  Surgeon: RDaneil Dolin MD;  Location: [facility];  Service: Endoscopy;  Laterality: N/A;  8:30  . ESOPHAGOGASTRODUODENOSCOPY  08/2007   moderate sized hiatal hernia  . ESOPHAGOGASTRODUODENOSCOPY  05/2010   noncritical schatzki ring s/p 67F  . ESOPHAGOGASTRODUODENOSCOPY (EGD) WITH ESOPHAGEAL DILATION N/A 02/06/2013   RAOZ:HYQMVHesophagus-s/p dilation up to a 530FPakistansize [facility].  Hiatal hernia  . EXTERNAL EAR SURGERY     bilateral  . FOOT SURGERY    . GLAUCOMA SURGERY    . small bowel capsule  10/2007   normal    Family History  Problem Relation Age of Onset  . Colon cancer      aunt  . Liver disease Neg Hx   . Inflammatory bowel disease Neg Hx   . Stroke Mother   . Heart attack Father   . Obesity    . COPD    . GER disease    . Diabetes type II    . Anxiety disorder    . Depression    . Depression Sister   .  Schizophrenia Sister   . Schizophrenia Other   . Drug abuse Other   . Alcohol abuse Other       History  Smoking Status  . Current Every Day Smoker  . Packs/day: 0.25  . Years: 40.00  . Types: Cigarettes  Smokeless Tobacco  . Never Used    History  Alcohol Use No   Says does not drink alcohol      Allergies  Allergen Reactions  . Thorazine [Chlorpromazine Hcl] Anaphylaxis  . Acetaminophen Other (See Comments)    Makes pt dizzy  . Aspirin Other (See Comments)    seizure  . Aspirin-Acetaminophen-Caffeine Other (See Comments)    seizure  . Nsaids Nausea And Vomiting  . Penicillins Nausea And Vomiting  . Tomato Rash    Current Outpatient Prescriptions  Medication Sig Dispense Refill  . benztropine (COGENTIN) 1 MG tablet Take 1 tablet (1 mg total) by mouth daily. 30 tablet 2  . calcium-vitamin D (OSCAL WITH D) 500-200  MG-UNIT per tablet Take 1 tablet by mouth 3 (three) times daily.      Marland Kitchen dicyclomine (BENTYL) 10 MG capsule TAKE 1 CAPSULE 4 TIMES DAILY BEFORE MEALS AND AT BEDTIME. 120 capsule 0  . lamoTRIgine (LAMICTAL) 100 MG tablet Take 1 tablet (100 mg total) by mouth 2 (two) times daily. 60 tablet 2  . lisinopril (PRINIVIL,ZESTRIL) 5 MG tablet TAKE (1) TABLET BY MOUTH ONCE DAILY. 30 tablet 5  . loratadine (CLARITIN) 10 MG tablet Take 1 tablet (10 mg total) by mouth daily. 30 tablet 5  . ondansetron (ZOFRAN ODT) 4 MG disintegrating tablet Take 1 tablet (4 mg total) by mouth every 8 (eight) hours as needed for nausea or vomiting. 20 tablet 0  . paliperidone (INVEGA) 9 MG 24 hr tablet Take 1 tablet (9 mg total) by mouth daily. 30 tablet 2  . polyethylene glycol (MIRALAX / GLYCOLAX) packet Take 17 g by mouth daily as needed for mild constipation. Reported on 01/27/2016    . propranolol (INDERAL) 10 MG tablet Take 1 tablet (10 mg total) by mouth 3 (three) times daily. 90 tablet 2  . sertraline (ZOLOFT) 100 MG tablet Take 1 tablet (100 mg total) by mouth daily. 30 tablet 2  . torsemide (DEMADEX) 20 MG tablet TAKE 2 TABLETS BY MOUTH EVERY MORNING. 60 tablet 5  . traZODone (DESYREL) 100 MG tablet Take 1 tablet (100 mg total) by mouth at bedtime. 30 tablet 2  . diclofenac (VOLTAREN) 75 MG EC tablet TAKE (1) TABLET TWICE DAILY. (Patient not taking: Reported on 04/20/2016) 60 tablet 4  . potassium chloride (K-DUR) 10 MEQ tablet TAKE (1) TABLET TWICE DAILY. (Patient not taking: Reported on 04/20/2016) 60 tablet 5   No current facility-administered medications for this visit.        Review of Systems:     Cardiac Review of Systems: Y or N  Chest Pain [ y ]  Resting SOB [ n  ] Exertional SOB  Blue.Reese ]  Vertell Limber Florencio.Farrier ]   Pedal Edema Blue.Reese   ]    Palpitations Blue.Reese  ] Syncope  Blue.Reese  ]   Presyncope [ n  ]  General Review of Systems: [Y] = yes [  ]=no Constitional: recent weight change [ y ]; anorexia [  ]; fatigue [  ]; nausea [   ]; night sweats [  ]; fever [  ]; or chills [  ];  Dental: poor dentition[y  ];  Eye : blurred vision [  ]; diplopia [   ]; vision changes [  ];  Amaurosis fugax[  ]; Resp: cough [  ];  wheezing[  ];  hemoptysis[  ]; shortness of breath[  ]; paroxysmal nocturnal dyspnea[  ]; dyspnea on exertion[  ]; or orthopnea[  ];  GI:  gallstones[  ], vomiting[  ];  dysphagia[  ]; melena[  ];  hematochezia [  ]; heartburn[  ];   Hx of  Colonoscopy[  ]; GU: kidney stones [  ]; hematuria[  ];   dysuria [  ];  nocturia[  ];  history of     obstruction [  ];             Skin: rash, swelling[  ];, hair loss[  ];  peripheral edema[  ];  or itching[  ]; Musculosketetal: myalgias[  ];  joint swelling[  ];  joint erythema[  ];  joint pain[  ];  back pain[  ];  Heme/Lymph: bruising[  ];  bleeding[  ];  anemia[  ];  Neuro: TIA[  ];  headaches[  ];  stroke[y  ];  vertigo[y  ];  seizures[y  ];   paresthesias[ y ];  difficulty walking[ y ];  Psych:depression[ y ]; anxiety[ y ];  Endocrine: diabetes[  ];  thyroid dysfunction[n  ];  Immunizations: Flu [ ? ]; Pneumococcal[?  ];  Other: Patient has a very positive review of systems including weight gain loss of appetite chest pain chest tightness shortness of breath and lying flat palpitation shortness of breath with exertion heart murmur atrial fibrillation arrhythmia productive cough bronchitis asthma wheezing abdominal pain constipation reflux trouble swallowing pain in her legs with walking pain feet when lying flat temporary blindness in one eye dizziness blackouts headaches muscle pain joint pain arthritis depression nervousness change in eyesight and change in hearing.  Physical Exam: .BP 112/75   Pulse 84   Resp 16   Ht 5' 10.5" (1.791 m)   Wt 266 lb 9.6 oz (120.9 kg)   SpO2 94% Comment: ON RA  BMI 37.71 kg/m    General  appearance: alert, cooperative, appears older than stated age and no distress Neurologic: intact Heart: regular rate and rhythm, S1, S2 normal, no murmur, click, rub or gallop and normal apical impulse Lungs: clear to auscultation bilaterally and normal percussion bilaterally Abdomen: soft, non-tender; bowel sounds normal; no masses,  no organomegaly Extremities: edema mild bil pedal edema and Homans sign is negative, no sign of DVT Wound:  Patient has no carotid bruits no cervical supraclavicular adenopathy is appreciated, especially in the area noted on that scan in the right axilla   Diagnostic Studies & Laboratory data:     Recent Radiology Findings:  Ct Chest Wo Contrast  Result Date: 04/20/2016 CLINICAL DATA:  Followup pulmonary nodule EXAM: CT CHEST WITHOUT CONTRAST TECHNIQUE: Multidetector CT imaging of the chest was performed following the standard protocol without IV contrast. COMPARISON:  02/11/2015 FINDINGS: Cardiovascular: Aortic atherosclerosis. Calcification in the LAD coronary artery noted. Normal heart size. No pericardial effusion. Mediastinum/Nodes: No enlarged mediastinal or axillary lymph nodes. Thyroid gland, trachea, and esophagus demonstrate no significant findings. Lungs/Pleura: No pleural effusion. Mild changes of emphysema. Diffuse bronchial wall thickening noted. The nodule of concern within the right middle lobe measures 1.6 by 1.0 cm, image 69 of series 4. Mean diameter is equal to 1.3 cm. On the study from 2014 this had a mean diameter  of 9 mm. No new pulmonary nodules identified. Upper Abdomen: No acute abnormality.The adrenal glands appear normal. Mild hepatic steatosis. Musculoskeletal: No chest wall mass or suspicious bone lesions identified. IMPRESSION: 1. The nodule within the right middle lobe has a mean diameter 13 mm on today's study. This is compared with 9 mm in 2014. Although this has a nonaggressive appearance and is somewhat circumscribed given the  interval growth a PET-CT is advised. 2. Aortic atherosclerosis.  LAD coronary artery calcification noted. Electronically Signed   By: Kerby Moors M.D.   On: 04/20/2016 11:33   Ct Chest Wo Contrast  02/11/2015   CLINICAL DATA:  Follow-up lung nodule. Mid left chest pain and productive cough. History of right breast cancer.  EXAM: CT CHEST WITHOUT CONTRAST  TECHNIQUE: Multidetector CT imaging of the chest was performed following the standard protocol without IV contrast.  COMPARISON:  Chest CT 01/27/2014. ; chest CT 01/15/2014 ; chest CT 11/01/2011.  FINDINGS: Mediastinum/Nodes: Visualized thyroid is unremarkable. No enlarged axillary, mediastinal or hilar lymphadenopathy. Normal heart size.  Lungs/Pleura: Central airways are patent. No consolidative pulmonary opacities. There is an 11 x 8 mm well-circumscribed nodule within the medial segment of the right middle lobe (image 38; series 4). This is unchanged from prior exam 01/27/2014 and minimally increased in size from CT 11/01/2011. No other suspicious appearing pulmonary nodules or masses. No pleural effusion or pneumothorax.  Upper abdomen: Hepatic steatosis.  Musculoskeletal: No aggressive or acute appearing osseous lesions.  IMPRESSION: No significant interval change in size of oval circumscribed nodule within the medial segment right middle lobe. Given stability over time, this is favored to represent a benign process. As discussed on prior CT, an additional followup chest CT in 1-2 years is recommended to ensure continued stability.   Electronically Signed   By: Lovey Newcomer M.D.   On: 02/11/2015 11:10   Ct Chest Wo Contrast  01/15/2014   CLINICAL DATA:  Shortness of breath. Cough. Hemoptysis. History of smoking. Followup evaluation of lung nodule.  EXAM: CT CHEST WITHOUT CONTRAST  TECHNIQUE: Multidetector CT imaging of the chest was performed following the standard protocol without IV contrast.  COMPARISON:  Chest CT 01/02/2013.  FINDINGS: Mediastinum:  Heart size is normal. There is no significant pericardial fluid, thickening or pericardial calcification. There is atherosclerosis of the thoracic aorta, the great vessels of the mediastinum and the coronary arteries, including calcified atherosclerotic plaque in the left anterior descending coronary artery. No pathologically enlarged mediastinal or hilar lymph nodes. Please note that accurate exclusion of hilar adenopathy is limited on noncontrast CT scans. Esophagus is unremarkable in appearance.  Lungs/Pleura: 12 x 7 mm well-circumscribed nodule in the mid medial segment of the right middle lobe (image 38 of series 4) is only slightly larger than remote prior study 11/01/2011 at which point it measured 11 x 7 mm when measured in a similar fashion on image 34 of series 3 of that examination. No other suspicious appearing pulmonary nodules or masses. No acute consolidative airspace disease. No pleural effusions.  Upper Abdomen: Unremarkable.  Musculoskeletal: There are no aggressive appearing lytic or blastic lesions noted in the visualized portions of the skeleton.  IMPRESSION: 1. 12 x 7 mm well-circumscribed nodule in the mid medial segment of the right middle lobe (image 38 of series 4) is only slightly larger than remote prior study 11/01/2011 at which point it measured 11 x 7 mm when measured in a similar fashion on image 34 of series 3 of that examination.  Given the relative stability over this 2 year time interval, this is strongly favored to represent a benign lesion, potentially a hammartoma (some hammartomas contain brown fat, which may account for low-level metabolic activity on the prior PET-CT). Another differential consideration would be in a low-grade carcinoid tumor. An additional noncontrast chest CT in 1-2 years is suggested to ensure continued stability.   Electronically Signed   By: Vinnie Langton M.D.   On: 01/15/2014 13:24  Ct Chest Wo Contrast  01/02/2013   *RADIOLOGY REPORT*  Clinical  Data: Right lung nodule.  History of breast cancer.  CT CHEST WITHOUT CONTRAST  Technique:  Multidetector CT imaging of the chest was performed following the standard protocol without IV contrast.  Comparison: Chest CT 04/22/2012.  Findings: The right middle lobe pulmonary nodule is unchanged. Measures 10.7 x 6.9 mm and previous measure 10.3 x 7.4 mm.  No new nodules are identified.  No acute pulmonary findings.  No pleural effusion.  The heart is normal in size.  No pericardial effusion.  No mediastinal or hilar lymphadenopathy.  Small scattered lymph nodes are stable.  The aorta is normal in caliber.  The esophagus is grossly normal.  The chest wall is unremarkable.  Stable calcifications of the right breast.  No supraclavicular or axillary adenopathy.  Stable bilateral axillary lymph nodes.  The thyroid gland is normal.  The bony thorax is intact.  IMPRESSION:  Stable right middle lobe pulmonary nodule.  Recommend follow-up noncontrast chest CT in 1 year to document stability.   Original Report Authenticated By: Marijo Sanes, M.D.     Ct Chest W Contrast  04/22/2012  *RADIOLOGY REPORT*  Clinical Data: Follow-up pulmonary nodule.  CT CHEST WITH CONTRAST  Technique:  Multidetector CT imaging of the chest was performed following the standard protocol during bolus administration of intravenous contrast.  Contrast: 1m OMNIPAQUE IOHEXOL 300 MG/ML  SOLN  Comparison: PET 11/29/2011 and CT chest 11/01/2011.  Findings: No pathologically enlarged mediastinal, hilar or axillary lymph nodes.  Heart size normal.  No pericardial effusion.  A nodule in the right middle lobe measures 10 x 7 mm, stable from 11/01/2011 when remeasured.  A tiny subpleural nodular density in the right lower lobe measures 3 mm (image 42), stable.  No pleural fluid.  Airway is unremarkable.  Incidental imaging of the upper abdomen shows low attenuation throughout the visualized portion of the liver.  No worrisome lytic or sclerotic lesions.   IMPRESSION: Right middle lobe nodule is unchanged from 11/01/2011.  Given mild hypermetabolism on 11/29/2011, a follow-up interval of 6 months is recommended to confirm continued stability.   Original Report Authenticated By: MLuretha Rued M.D.      11/09/2011 RADIOLOGY REPORT*  Clinical Data: Right basilar pulmonary nodule on abdominal CT.  Dysphagia. The patient has diabetes mellitus and unspecified  mental retardation.  CT CHEST WITH CONTRAST  Technique: Multidetector CT imaging of the chest was performed  following the standard protocol during bolus administration of  intravenous contrast.  Contrast: 861mOMNIPAQUE IOHEXOL 300 MG/ML SOLN  Comparison: Abdominal CTs 10/10/2011 and 01/19/2005. Chest  radiographs 10/23/2001.  Findings: As demonstrated on the recent abdominal CT, there is a  lobulated well circumscribed 10 x 9 mm right middle lobe nodule on  image 35. This is noncalcified. This area of the lung bases was  not imaged on the 2006 CT. No other pulmonary nodules are  identified. There is no airspace disease or confluent airspace  opacity.  There are no enlarged  mediastinal or hilar lymph nodes. There is  no pleural or pericardial effusion. Minimal aortic atherosclerosis  is noted.  Images through the upper abdomen demonstrate diffuse hepatic  steatosis. There is no adrenal mass.  IMPRESSION:  1. Lobulated noncalcified 1-cm right middle lobe nodule was not  imaged on prior abdominal CT. This nodule is not conclusively  demonstrated on radiographs such that stability cannot be  addressed.  2. This nodule is nonspecific and could reflect a hamartoma or  other benign lesion. However, malignancy cannot be excluded.  3. No other nodules, adenopathy or acute findings.  This nodule would be difficult to biopsy given its close proximity  to the heart. Management options include PET CT to assess for  hypermetabolic activity and follow-up CT (depending on risk factors  for  malignancy). If follow-up is elected, follow-up chest CT at 3  months is recommended. This recommendation is adapted from the  consensus statement: Guidelines for Management of Small Pulmonary  Nodules Detected on CT Scans: A Statement from the [employer] as published in Radiology 2005; 237:395-400.  Original Report Authenticated By: Vivia Ewing, M.D.  Halina Andreas, MD Tue Jan 09, 2012 2:52:39 PM EDT       **ADDENDUM** CREATED: 01/09/2012 14:43:25  Voice recognition errors:  Within the Findings section, the second sentence of the CHEST  section should read: "NO additional hypermetabolic nodules are  present."  Within the first IMPRESSION, the last phrase of the first sentence  should read: "for a nodule this SIZE."  Findings discussed with Dr. Servando Snare on 07/ 08/2011  **END ADDENDUM** SIGNED BY: Suzy Bouchard, M.D.      Study Result     *RADIOLOGY REPORT*  Clinical Data: Recent imaging demonstrates a solitary pulmonary  nodule. FDG PET CT requested to evaluate for possible malignancy.  NUCLEAR MEDICINE PET SKULL BASE TO THIGH  Fasting Blood Glucose: 113  Technique: 18.4 mCi F-18 FDG was injected intravenously via the  right antecubital fossa. CT data was obtained and used for  attenuation correction and anatomic localization only. (This was  not acquired as a diagnostic CT examination.) Additional exam  technical data entered on technologist worksheet.  Comparison: CT 11/01/2011  Findings:  Head/Neck: No hypermetabolic lymph nodes in the neck.  Chest: Within the right middle lobe there is a 8 mm pulmonary  nodule adjacent to the right atrium with mild metabolic activity (  SUV max = 2.5). Additional hypermetabolic nodules are present. No  hypermetabolic mediastinal lymph nodes.  There is a single hypermetabolic right axillary lymph node which is  normal pathology. There is misregistration in the right axilla due  to patient motion.  Abdomen/Pelvis:  No abnormal hypermetabolic activity within the  liver, pancreas, adrenal glands, or spleen. No hypermetabolic  lymph nodes in the abdomen or pelvis.  Skeleton: No focal hypermetabolic activity to suggest skeletal  metastasis. Sclerosis of the left SI joint appears benign.  IMPRESSION:  1. Mildly hypermetabolic nodule within the right middle lobe.  Although the activity is mild, for a nodule this side this is  concerning although nonspecific. If biopsy is not feasible due to  location, recommend short-term follow-up CT with contrast in 1 to  3 months.  2. No evidence of metastasis.  3. Hypermetabolic right axial lymph node is likely reactive.  Original Report Authenticated By: Suzy Bouchard, M.D.       Recent Lab Findings: Lab Results  Component Value Date   WBC 4.8 02/04/2016   HGB 13.5 01/27/2014  HCT 35.8 02/04/2016   PLT 205 02/04/2016   GLUCOSE 91 02/04/2016   CHOL 156 02/04/2016   TRIG 95 02/04/2016   HDL 61 02/04/2016   LDLCALC 76 02/04/2016   ALT 21 02/04/2016   AST 25 02/04/2016   NA 138 02/04/2016   K 4.0 02/04/2016   CL 98 02/04/2016   CREATININE 0.86 02/04/2016   BUN 8 02/04/2016   CO2 28 02/04/2016   HGBA1C 5.7 01/27/2016      Assessment / Plan:   Right middle lobe lung nodule-Given the relative stability since 2013  time interval, this is strongly favored to represent a benign lesion, potentially a hammartoma , There potentially is a very mild increase in size since abrasion is seen in 2013 of 2-3 mm. The patient is a poor surgical candidate. We've elected to continue to follow this but will obtain a CT scan in 6 months rather than 12-24 months.  Unfortunately the patient continues to smoke and in spite of attempts to have her stop it appears unlikely that she will be able to.  I have again reviewed with her the need to stop smoking    Grace Isaac MD  [place] Office [phone] 04/20/2016 11:56 AM

## 2016-04-24 ENCOUNTER — Encounter: Payer: Self-pay | Admitting: Internal Medicine

## 2016-04-28 ENCOUNTER — Ambulatory Visit (INDEPENDENT_AMBULATORY_CARE_PROVIDER_SITE_OTHER): Payer: Medicare Other | Admitting: Family Medicine

## 2016-04-28 ENCOUNTER — Encounter: Payer: Self-pay | Admitting: Family Medicine

## 2016-04-28 VITALS — BP 120/80 | Ht 70.5 in | Wt 263.4 lb

## 2016-04-28 DIAGNOSIS — R5383 Other fatigue: Secondary | ICD-10-CM

## 2016-04-28 DIAGNOSIS — I10 Essential (primary) hypertension: Secondary | ICD-10-CM

## 2016-04-28 DIAGNOSIS — E119 Type 2 diabetes mellitus without complications: Secondary | ICD-10-CM | POA: Diagnosis not present

## 2016-04-28 DIAGNOSIS — R270 Ataxia, unspecified: Secondary | ICD-10-CM

## 2016-04-28 LAB — POCT GLYCOSYLATED HEMOGLOBIN (HGB A1C): Hemoglobin A1C: 6

## 2016-04-28 NOTE — Progress Notes (Signed)
   Subjective:    Patient ID: [name], female    DOB: 03/26/1961, 55 y.o.   MRN: [id number]  Diabetes  She presents for her follow-up diabetic visit. She has type 2 diabetes mellitus. No MedicAlert identification noted. She sees a podiatrist.Eye exam is current.   Patient has concerns this visit of dizziness, and non pitting foot swelling.   Results for orders placed or performed in visit on 04/28/16  POCT HgB A1C  Result Value Ref Range   Hemoglobin A1C 6.0    She sees a psychiatrist on a regular basis takes her medicine does fairly well with that She doesn't eat properly she does not exercise on a regular basis. She has moderate ataxia at times falling according to this social worker  Review of Systems Patient is very symptomatic she complains of belly pains chest she complains of joint pains she states at times she falls other times she has a hard time keeping her balance. It is very difficult discerning what is going on with this patient because of her symptomatology    Objective:   Physical Exam  Lungs clear hearts regular she has some adipose tissue in her feet she is worried about swelling but it's really not fluid is just adipose tissue neurologically she is okay but she walks forward in such a way that she is at higher risk of falling      Assessment & Plan:  Patient with mild ataxia recommend physical therapy patient unable to go to physical therapy because of her disability therefore home physical therapy would be indicated face-to-face evaluation done today  Diabetes decent control but patient eats terribly she was encouraged to do much better job watching how she eats and do a better job with food selection avoiding regular sugary drinks  I don't see any significant swelling in her feet I don't recommend any change in medications there  She is up-to-date on all her blood work in [place] were do this on her next visit  She'll follow-up in approximately 3-4  months  Her social worker was with her it to 25 minutes to see the patient greater than half in counseling.

## 2016-05-08 ENCOUNTER — Telehealth: Payer: Self-pay | Admitting: *Deleted

## 2016-05-08 DIAGNOSIS — R260 Ataxic gait: Secondary | ICD-10-CM | POA: Diagnosis not present

## 2016-05-08 DIAGNOSIS — E1140 Type 2 diabetes mellitus with diabetic neuropathy, unspecified: Secondary | ICD-10-CM | POA: Diagnosis not present

## 2016-05-08 NOTE — Telephone Encounter (Signed)
done

## 2016-05-08 NOTE — Telephone Encounter (Signed)
Needing verbal order for pt 3 times a week for 3 weeks then one day on the fourth week.    Also wanted to let you there were some interactions between some of her meds.   Benztropine and potassium  And trazodone and paliperidone.

## 2016-05-08 NOTE — Telephone Encounter (Signed)
Please send in the verbal order for this thank you

## 2016-05-11 ENCOUNTER — Other Ambulatory Visit (HOSPITAL_COMMUNITY): Payer: Self-pay | Admitting: Psychiatry

## 2016-05-11 ENCOUNTER — Other Ambulatory Visit: Payer: Self-pay | Admitting: Family Medicine

## 2016-05-11 ENCOUNTER — Other Ambulatory Visit: Payer: Self-pay | Admitting: *Deleted

## 2016-05-11 DIAGNOSIS — Z9181 History of falling: Secondary | ICD-10-CM | POA: Diagnosis not present

## 2016-05-11 DIAGNOSIS — I69393 Ataxia following cerebral infarction: Secondary | ICD-10-CM | POA: Diagnosis not present

## 2016-05-11 DIAGNOSIS — E1142 Type 2 diabetes mellitus with diabetic polyneuropathy: Secondary | ICD-10-CM | POA: Diagnosis not present

## 2016-05-11 MED ORDER — POTASSIUM CHLORIDE ER 10 MEQ PO TBCR: EXTENDED_RELEASE_TABLET | ORAL | 5 refills | Status: DC

## 2016-05-11 MED ORDER — TORSEMIDE 20 MG PO TABS: 40.0000 mg | ORAL_TABLET | Freq: Every morning | ORAL | 5 refills | Status: DC

## 2016-05-11 MED ORDER — LISINOPRIL 5 MG PO TABS: ORAL_TABLET | ORAL | 5 refills | Status: DC

## 2016-05-11 NOTE — Telephone Encounter (Signed)
May have refills 4 on 1 diclofenac the other one looks like a duplicate

## 2016-05-12 ENCOUNTER — Other Ambulatory Visit: Payer: Self-pay

## 2016-05-12 MED ORDER — DICLOFENAC SODIUM 75 MG PO TBEC: DELAYED_RELEASE_TABLET | ORAL | 4 refills | Status: DC

## 2016-05-17 DIAGNOSIS — Z9181 History of falling: Secondary | ICD-10-CM | POA: Diagnosis not present

## 2016-05-17 DIAGNOSIS — I69393 Ataxia following cerebral infarction: Secondary | ICD-10-CM | POA: Diagnosis not present

## 2016-05-17 DIAGNOSIS — E1142 Type 2 diabetes mellitus with diabetic polyneuropathy: Secondary | ICD-10-CM | POA: Diagnosis not present

## 2016-05-19 DIAGNOSIS — I69393 Ataxia following cerebral infarction: Secondary | ICD-10-CM | POA: Diagnosis not present

## 2016-05-19 DIAGNOSIS — E1142 Type 2 diabetes mellitus with diabetic polyneuropathy: Secondary | ICD-10-CM | POA: Diagnosis not present

## 2016-05-19 DIAGNOSIS — Z9181 History of falling: Secondary | ICD-10-CM | POA: Diagnosis not present

## 2016-05-24 DIAGNOSIS — I69393 Ataxia following cerebral infarction: Secondary | ICD-10-CM | POA: Diagnosis not present

## 2016-05-24 DIAGNOSIS — Z9181 History of falling: Secondary | ICD-10-CM | POA: Diagnosis not present

## 2016-05-24 DIAGNOSIS — E1142 Type 2 diabetes mellitus with diabetic polyneuropathy: Secondary | ICD-10-CM | POA: Diagnosis not present

## 2016-05-26 DIAGNOSIS — Z9181 History of falling: Secondary | ICD-10-CM | POA: Diagnosis not present

## 2016-05-26 DIAGNOSIS — E1142 Type 2 diabetes mellitus with diabetic polyneuropathy: Secondary | ICD-10-CM | POA: Diagnosis not present

## 2016-05-26 DIAGNOSIS — I69393 Ataxia following cerebral infarction: Secondary | ICD-10-CM | POA: Diagnosis not present

## 2016-05-29 DIAGNOSIS — E1142 Type 2 diabetes mellitus with diabetic polyneuropathy: Secondary | ICD-10-CM | POA: Diagnosis not present

## 2016-05-29 DIAGNOSIS — I69393 Ataxia following cerebral infarction: Secondary | ICD-10-CM | POA: Diagnosis not present

## 2016-05-29 DIAGNOSIS — Z9181 History of falling: Secondary | ICD-10-CM | POA: Diagnosis not present

## 2016-05-31 DIAGNOSIS — E1151 Type 2 diabetes mellitus with diabetic peripheral angiopathy without gangrene: Secondary | ICD-10-CM | POA: Diagnosis not present

## 2016-05-31 DIAGNOSIS — L84 Corns and callosities: Secondary | ICD-10-CM | POA: Diagnosis not present

## 2016-05-31 DIAGNOSIS — L602 Onychogryphosis: Secondary | ICD-10-CM | POA: Diagnosis not present

## 2016-06-09 ENCOUNTER — Other Ambulatory Visit (HOSPITAL_COMMUNITY): Payer: Self-pay | Admitting: Psychiatry

## 2016-06-12 ENCOUNTER — Telehealth (HOSPITAL_COMMUNITY): Payer: Self-pay | Admitting: *Deleted

## 2016-06-12 NOTE — Telephone Encounter (Signed)
noted 

## 2016-06-12 NOTE — Telephone Encounter (Signed)
Pt pharmacy requesting refills for pt medications via e-scribe. Pt Lamictal, Invega, Propranolol, Zoloft and Trazodone were all filled on 04-05-16. Py next f/u with provider is 06-26-16. Pt medications all come pre-packaged. Pt pharmacy is [facility].

## 2016-06-12 NOTE — Telephone Encounter (Signed)
She has enough refills

## 2016-06-12 NOTE — Telephone Encounter (Signed)
Message sent to provider 

## 2016-06-26 ENCOUNTER — Ambulatory Visit (INDEPENDENT_AMBULATORY_CARE_PROVIDER_SITE_OTHER): Payer: Medicare Other | Admitting: Psychiatry

## 2016-06-26 ENCOUNTER — Telehealth: Payer: Self-pay | Admitting: Family Medicine

## 2016-06-26 ENCOUNTER — Encounter (HOSPITAL_COMMUNITY): Payer: Self-pay | Admitting: Psychiatry

## 2016-06-26 VITALS — BP 118/80 | Ht 70.0 in | Wt 264.0 lb

## 2016-06-26 DIAGNOSIS — Z811 Family history of alcohol abuse and dependence: Secondary | ICD-10-CM | POA: Diagnosis not present

## 2016-06-26 DIAGNOSIS — Z888 Allergy status to other drugs, medicaments and biological substances status: Secondary | ICD-10-CM

## 2016-06-26 DIAGNOSIS — Z818 Family history of other mental and behavioral disorders: Secondary | ICD-10-CM

## 2016-06-26 DIAGNOSIS — F251 Schizoaffective disorder, depressive type: Secondary | ICD-10-CM | POA: Diagnosis not present

## 2016-06-26 DIAGNOSIS — Z8489 Family history of other specified conditions: Secondary | ICD-10-CM | POA: Diagnosis not present

## 2016-06-26 DIAGNOSIS — Z880 Allergy status to penicillin: Secondary | ICD-10-CM

## 2016-06-26 DIAGNOSIS — Z79899 Other long term (current) drug therapy: Secondary | ICD-10-CM | POA: Diagnosis not present

## 2016-06-26 DIAGNOSIS — Z813 Family history of other psychoactive substance abuse and dependence: Secondary | ICD-10-CM

## 2016-06-26 DIAGNOSIS — G4730 Sleep apnea, unspecified: Secondary | ICD-10-CM

## 2016-06-26 DIAGNOSIS — Z91018 Allergy to other foods: Secondary | ICD-10-CM

## 2016-06-26 MED ORDER — PROPRANOLOL HCL 10 MG PO TABS: 10.0000 mg | ORAL_TABLET | Freq: Three times a day (TID) | ORAL | 2 refills | Status: DC

## 2016-06-26 MED ORDER — PALIPERIDONE ER 9 MG PO TB24: 9.0000 mg | ORAL_TABLET | Freq: Every day | ORAL | 2 refills | Status: DC

## 2016-06-26 MED ORDER — BENZTROPINE MESYLATE 1 MG PO TABS: 1.0000 mg | ORAL_TABLET | Freq: Every day | ORAL | 2 refills | Status: DC

## 2016-06-26 MED ORDER — SERTRALINE HCL 100 MG PO TABS: 100.0000 mg | ORAL_TABLET | Freq: Every day | ORAL | 2 refills | Status: DC

## 2016-06-26 MED ORDER — LAMOTRIGINE 100 MG PO TABS: 100.0000 mg | ORAL_TABLET | Freq: Two times a day (BID) | ORAL | 2 refills | Status: DC

## 2016-06-26 MED ORDER — TRAZODONE HCL 100 MG PO TABS: 100.0000 mg | ORAL_TABLET | Freq: Every day | ORAL | 2 refills | Status: DC
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
## 2016-06-27 ENCOUNTER — Encounter: Payer: Self-pay | Admitting: Family Medicine

## 2016-06-27 DIAGNOSIS — G4733 Obstructive sleep apnea (adult) (pediatric): Secondary | ICD-10-CM | POA: Insufficient documentation

## 2016-06-27 NOTE — Telephone Encounter (Signed)
Brendale please see below. Do you know how the order needs to be worded?

## 2016-06-27 NOTE — Telephone Encounter (Signed)
Nurses put in order for new CPAP machine [facility]. She had a sleep study done in 2010 which showed obstructive sleep apnea with responding well to a CPAP of 10 cm. If she needs a more up-to-date sleep study this will need to be ordered. You may want to discuss with Brendale. Sleep study is in the medical records under sleep medicine under notes

## 2016-06-29 NOTE — Telephone Encounter (Signed)
[facility] for social worker, need to know where pt got her current machine and if it's ok to order from same place  Order written.  Just need to gather all necessary documentation and get order signed

## 2016-07-05 NOTE — Telephone Encounter (Signed)
This is insurance regulations at its best. Please notify social worker that we will discuss this further at her office visit later in January. Please also go ahead and put in referral for sleep study that can be done in January or early February for sleep apnea. Request split protocol for titration purposes

## 2016-07-05 NOTE — Telephone Encounter (Signed)
Spoke with Mariann Laster at [facility] will need a new face to face OV to discuss sleep and need for CPAP (notes we have are too old to use to get insurance to cover)  Pt will also need a new sleep study  This is due to insurance requirements and the fact that the patient turned in her previous CPAP due to not being able to tolerate the pressure  Once pt has OV & new sleep study, we can order new CPAP

## 2016-07-06 NOTE — Telephone Encounter (Signed)
Will hold referral for sleep study due to face to face office visit has to be before sleep study is done  Once OV note is complete for her OV in late January, will process sleep study referral

## 2016-07-06 NOTE — Telephone Encounter (Signed)
Left message to return call 

## 2016-07-06 NOTE — Addendum Note (Signed)
Addended by: Carmelina Noun on: 07/06/2016 10:19 AM   Modules accepted: Orders

## 2016-07-06 NOTE — Telephone Encounter (Signed)
Discussed with social worker [name]. Order for sleep study put in.

## 2016-07-20 ENCOUNTER — Telehealth: Payer: Self-pay | Admitting: Family Medicine

## 2016-07-20 NOTE — Telephone Encounter (Signed)
Social worker dropped off an fl2 form to be filled out. Form is in Dr's office in red folder.

## 2016-07-23 NOTE — Telephone Encounter (Signed)
Nurse's-please then please fill-in medication list on this patient thank you forward form to social services. If possible ask her social worker at social services is this patient being transferred to another facility where she living currently.

## 2016-07-25 NOTE — Telephone Encounter (Signed)
Medications added to form from [facility]

## 2016-07-25 NOTE — Telephone Encounter (Signed)
Left message to return call for social worker Tonye Pearson)- form in nurses basket

## 2016-07-25 NOTE — Telephone Encounter (Signed)
Social worker stated that patient is not moving from current location but has to have formed filled out once a year for her aide services. Form completed and up front for pick up. Social worker will pick up form.

## 2016-07-31 ENCOUNTER — Ambulatory Visit (INDEPENDENT_AMBULATORY_CARE_PROVIDER_SITE_OTHER): Payer: Medicare Other | Admitting: Family Medicine

## 2016-07-31 ENCOUNTER — Encounter: Payer: Self-pay | Admitting: Family Medicine

## 2016-07-31 VITALS — Ht 70.5 in

## 2016-07-31 DIAGNOSIS — I10 Essential (primary) hypertension: Secondary | ICD-10-CM

## 2016-07-31 DIAGNOSIS — G4733 Obstructive sleep apnea (adult) (pediatric): Secondary | ICD-10-CM

## 2016-07-31 DIAGNOSIS — E119 Type 2 diabetes mellitus without complications: Secondary | ICD-10-CM

## 2016-07-31 LAB — POCT GLYCOSYLATED HEMOGLOBIN (HGB A1C): Hemoglobin A1C: 5.2

## 2016-07-31 NOTE — Progress Notes (Addendum)
   Subjective:    Patient ID: [name], female    DOB: Aug 15, 1960, 56 y.o.   MRN: [id number]  Diabetes  She presents for her follow-up diabetic visit. She has type 2 diabetes mellitus. Risk factors for coronary artery disease include dyslipidemia, diabetes mellitus, hypertension, post-menopausal and sedentary lifestyle. Current diabetic treatment includes diet. She is compliant with treatment all of the time. Her weight is stable. She is following a diabetic diet. She has not had a previous visit with a dietitian. Sees podiatrist: goes in February.Eye exam current: appointment scheduled.   This patient also has sleep apnea. She is no longer able to use her machine because She had to turn it back and because of noncompliance. She states she could not tolerate the high pressure. The patient does have sleep apnea and needs her treatments. She will need a new study. Relates snoring daytime fatigue tiredness. She does have risk factors including obesity and older age. Face-to-face evaluation was done regarding this.  HTN patient does do a good job taken her medicines and following things closely  She does follow-up with psychiatrist on a regular basis for mental health issues she has a Education officer, museum that helps her with day-to-day living she does live on her own in a apartment building in her food choices are not always had good  Review of Systems Currently denies any chest tightness pressure pain shortness breath does get occasional bloating denies rectal bleeding denies vomiting.    Objective:   Physical Exam Lungs clear heart regular HEENT benign abdomen mild obesity extremities no edema       Assessment & Plan:  Arthralgias continue Voltaren as directed Sleep apnea sleep study ordered. See above. Numerous symptomatology issues no findings of any acute issues today Blood pressure good control with current meds Depression being treated by specialist she was encouraged follow-up  fair Diabetes under good control with diet and activity Intermittent abdominal symptoms been till as necessary  Hopefully a new sleep study will in-depth getting the patient on a better pressure and be able to get a new machine which intern should allow her to be more compliant

## 2016-08-02 ENCOUNTER — Telehealth: Payer: Self-pay | Admitting: Family Medicine

## 2016-08-02 NOTE — Addendum Note (Signed)
Addended by: Dorothyann Gibbs on: 08/02/2016 09:17 AM   Modules accepted: Orders

## 2016-08-02 NOTE — Telephone Encounter (Signed)
Dictation was completed including sleep apnea issue nurses will be putting him referral thank you

## 2016-08-02 NOTE — Telephone Encounter (Signed)
Reviewed OV note from 07/31/16, there's no mention of sleep study needs.  Need face to face documentation for order of new sleep study  Spoke with Mariann Laster at [facility] will need a new face to face OV to discuss sleep and need for CPAP (notes we have are too old to use to get insurance to cover)  Please see phone message from 06/26/2016 and advise

## 2016-08-02 NOTE — Progress Notes (Signed)
Order entered

## 2016-08-07 ENCOUNTER — Telehealth: Payer: Self-pay | Admitting: *Deleted

## 2016-08-07 NOTE — Telephone Encounter (Signed)
error 

## 2016-08-15 ENCOUNTER — Other Ambulatory Visit: Payer: Self-pay

## 2016-08-15 ENCOUNTER — Encounter: Payer: Self-pay | Admitting: Gastroenterology

## 2016-08-15 ENCOUNTER — Telehealth: Payer: Self-pay

## 2016-08-15 ENCOUNTER — Ambulatory Visit (INDEPENDENT_AMBULATORY_CARE_PROVIDER_SITE_OTHER): Payer: Medicare Other | Admitting: Gastroenterology

## 2016-08-15 VITALS — BP 113/75 | HR 87 | Temp 98.1°F | Ht 70.5 in | Wt 262.4 lb

## 2016-08-15 DIAGNOSIS — K921 Melena: Secondary | ICD-10-CM

## 2016-08-15 DIAGNOSIS — R1084 Generalized abdominal pain: Secondary | ICD-10-CM

## 2016-08-15 DIAGNOSIS — Z860100 Personal history of colon polyps, unspecified: Secondary | ICD-10-CM

## 2016-08-15 DIAGNOSIS — R1013 Epigastric pain: Secondary | ICD-10-CM

## 2016-08-15 DIAGNOSIS — R1310 Dysphagia, unspecified: Secondary | ICD-10-CM | POA: Insufficient documentation

## 2016-08-15 DIAGNOSIS — K582 Mixed irritable bowel syndrome: Secondary | ICD-10-CM

## 2016-08-15 DIAGNOSIS — K589 Irritable bowel syndrome without diarrhea: Secondary | ICD-10-CM

## 2016-08-15 DIAGNOSIS — R112 Nausea with vomiting, unspecified: Secondary | ICD-10-CM | POA: Diagnosis not present

## 2016-08-15 DIAGNOSIS — R109 Unspecified abdominal pain: Secondary | ICD-10-CM

## 2016-08-15 DIAGNOSIS — R1319 Other dysphagia: Secondary | ICD-10-CM

## 2016-08-15 MED ORDER — PEG 3350-KCL-NA BICARB-NACL 420 G PO SOLR: 4000.0000 mL | ORAL | 0 refills | Status: DC

## 2016-08-15 MED ORDER — PANTOPRAZOLE SODIUM 40 MG PO TBEC: 40.0000 mg | DELAYED_RELEASE_TABLET | Freq: Every day | ORAL | 3 refills | Status: DC

## 2016-08-15 MED ORDER — DICYCLOMINE HCL 10 MG PO CAPS: 10.0000 mg | ORAL_CAPSULE | Freq: Three times a day (TID) | ORAL | 3 refills | Status: DC

## 2016-08-15 MED ORDER — POLYETHYLENE GLYCOL 3350 17 G PO PACK: 17.0000 g | PACK | Freq: Every day | ORAL | 3 refills | Status: DC | PRN

## 2016-08-15 NOTE — Progress Notes (Signed)
Primary Care Physician: Sallee Lange, MD  Primary Gastroenterologist:  Garfield Cornea, MD   Chief Complaint  Patient presents with  . Colonoscopy    set up 3 yr TCS  . Diarrhea    HPI: [name] is a 56 y.o. female here to schedule three-year surveillance colonoscopy. She was last seen in 2014 at time of her last colonoscopy. She had a couple of tubular adenomas, multiple hyperplastic appearing polyps. At baseline she has some mental retardation. She has a history of IBS with alternating constipation and diarrhea. She has been on Bentyl and MiraLAX in the past. She states she has run out of medication. She needs refills. History is potentially somewhat unreliable. She tells me that she has had episodes of going several weeks without a bowel movement but then goes daily at times. Currently she states she has more diarrhea than constipation. Abdominal cramping somewhat relieved with bowel movement. In addition she does have a few supper abdominal discomfort, feels like she's been "punched". Intermittent nausea and vomiting several days weekly. History of bright red blood per rectum and questionable melena but none in the recent weeks. Complains of dysphagia to solid foods. Some heartburn. No longer on Nexium, "I ran out".   Currently she is struggling somewhat with her medications. She is in transition waiting for a new CNA. In the past CNA's have helped her get through her bowel prep. Discussed at length with the social worker who is present today. She ensures me that she is able to assist patient with understanding her bowel prep instructions. Patient is compliant but she has some difficulty reading.  Patient reports taking BC powders recently for headache. Does not take daily.  Current Outpatient Prescriptions  Medication Sig Dispense Refill  . benztropine (COGENTIN) 1 MG tablet Take 1 tablet (1 mg total) by mouth daily. 30 tablet 2  . calcium-vitamin D (OSCAL WITH D) 500-200  MG-UNIT per tablet Take 1 tablet by mouth 3 (three) times daily.      Marland Kitchen dicyclomine (BENTYL) 10 MG capsule TAKE 1 CAPSULE 4 TIMES DAILY BEFORE MEALS AND AT BEDTIME. 120 capsule 0  . lamoTRIgine (LAMICTAL) 100 MG tablet Take 1 tablet (100 mg total) by mouth 2 (two) times daily. 60 tablet 2  . lisinopril (PRINIVIL,ZESTRIL) 5 MG tablet TAKE (1) TABLET BY MOUTH ONCE DAILY. (Patient taking differently: Take 5 mg by mouth 3 (three) times daily. TAKE (1) TABLET BY MOUTH ONCE DAILY.) 30 tablet 5  . ondansetron (ZOFRAN ODT) 4 MG disintegrating tablet Take 1 tablet (4 mg total) by mouth every 8 (eight) hours as needed for nausea or vomiting. 20 tablet 0  . paliperidone (INVEGA) 9 MG 24 hr tablet Take 1 tablet (9 mg total) by mouth daily. 30 tablet 2  . polyethylene glycol (MIRALAX / GLYCOLAX) packet Take 17 g by mouth daily as needed for mild constipation. Reported on 01/27/2016    . potassium chloride (K-DUR) 10 MEQ tablet TAKE (1) TABLET TWICE DAILY. 60 tablet 5  . sertraline (ZOLOFT) 100 MG tablet Take 1 tablet (100 mg total) by mouth daily. 30 tablet 2  . torsemide (DEMADEX) 20 MG tablet Take 2 tablets (40 mg total) by mouth every morning. 60 tablet 5  . traZODone (DESYREL) 100 MG tablet Take 1 tablet (100 mg total) by mouth at bedtime. 30 tablet 2   No current facility-administered medications for this visit.     Allergies as of 08/15/2016 - Review Complete 08/15/2016  Allergen  Reaction Noted  . Thorazine [chlorpromazine hcl] Anaphylaxis 10/20/2010  . Acetaminophen Other (See Comments)   . Aspirin Other (See Comments)   . Aspirin-acetaminophen-caffeine Other (See Comments)   . Nsaids Nausea And Vomiting   . Penicillins Nausea And Vomiting   . Tomato Rash 01/17/2012   Past Medical History:  Diagnosis Date  . Anxiety   . Diabetes mellitus   . Diabetes mellitus, type II ([place])   . HTN (hypertension)   . Hyperglycemia   . IBS (irritable bowel syndrome)   . Lung nodules    right, followed by  PCP, PET 11/2011  . Mental retardation   . MI (myocardial infarction)   . Migraines   . Sleep apnea    Past Surgical History:  Procedure Laterality Date  . ABDOMINAL HYSTERECTOMY    . COLONOSCOPY  11/2007   hyperplastic polyps, prior hx of adenomas   . COLONOSCOPY  05/2010   incomplete due to poor prep, hyperplastic rectal polyp  . COLONOSCOPY  05/05/2002   Dimunitive polyps in the rectum and left colon, cold    biopsied/removed.  Scattered few left-sided diverticula.  Regular colonic   mucosa appeared normal  . COLONOSCOPY N/A 05/28/2013   Rourk: mulitple tubular adenomas removed. next tcs 05/2016  . ESOPHAGOGASTRODUODENOSCOPY  08/2007   moderate sized hiatal hernia  . ESOPHAGOGASTRODUODENOSCOPY  05/2010   noncritical schatzki ring s/p 76F  . ESOPHAGOGASTRODUODENOSCOPY (EGD) WITH ESOPHAGEAL DILATION N/A 02/06/2013   [id number]:[id number] esophagus-s/p dilation up to a 7 [place] size with [facility] dilators.  Hiatal hernia  . EXTERNAL EAR SURGERY     bilateral  . FOOT SURGERY    . GLAUCOMA SURGERY    . small bowel capsule  10/2007   normal   Family History  Problem Relation Age of Onset  . Stroke Mother   . Heart attack Father   . Schizophrenia Other   . Drug abuse Other   . Alcohol abuse Other   . Colon cancer      aunt  . Obesity    . COPD    . GER disease    . Diabetes type II    . Anxiety disorder    . Depression    . Depression Sister   . Schizophrenia Sister   . Liver disease Neg Hx   . Inflammatory bowel disease Neg Hx    Social History   Social History  . Marital status: Divorced    Spouse name: N/A  . Number of children: N/A  . Years of education: N/A   Occupational History  . disabled Unemployed   Social History Main Topics  . Smoking status: Current Every Day Smoker    Packs/day: 0.25    Years: 40.00    Types: Cigarettes  . Smokeless tobacco: Never Used  . Alcohol use No     Comment: social drinker  . Drug use: No  . Sexual activity: Yes    Partners:  Male    Birth control/ protection: None     Comment: boyfriend   Other Topics Concern  . None   Social History Narrative  . None    ROS:  General: Negative for anorexia, weight loss, fever, chills, fatigue, weakness. ENT: Negative for hoarseness,  nasal congestion.See history of present illness CV: Negative for chest pain, angina, palpitations, dyspnea on exertion, peripheral edema.  Respiratory: Negative for dyspnea at rest, dyspnea on exertion, cough, sputum, wheezing.  GI: See history of present illness. GU:  Negative for dysuria, hematuria, urinary  incontinence, urinary frequency, nocturnal urination.  Endo: Negative for unusual weight change.    Physical Examination:   BP 113/75   Pulse 87   Temp 98.1 F (36.7 C) (Oral)   Ht 5' 10.5" (1.791 m)   Wt 262 lb 6.4 oz (119 kg)   BMI 37.12 kg/m   General: Well-nourished, well-developed in no acute distress. Breathing by Education officer, museum. Eyes: No icterus. Mouth: Oropharyngeal mucosa moist and pink , no lesions erythema or exudate. Lungs: Clear to auscultation bilaterally.  Heart: Regular rate and rhythm, no murmurs rubs or gallops.  Abdomen: Bowel sounds are normal, diffuse abdominal discomfort with deep palpation, increased in epigastrium, nondistended, no hepatosplenomegaly or masses, no abdominal bruits or hernia , no rebound or guarding.   Extremities: No lower extremity edema. No clubbing or deformities. Neuro: Alert and oriented x 4   Skin: Warm and dry, no jaundice.   Psych: Alert and cooperative, normal mood and affect.  Labs:  Lab Results  Component Value Date   CREATININE 0.86 02/04/2016   BUN 8 02/04/2016   NA 138 02/04/2016   K 4.0 02/04/2016   CL 98 02/04/2016   CO2 28 02/04/2016   Lab Results  Component Value Date   ALT 21 02/04/2016   AST 25 02/04/2016   ALKPHOS 84 02/04/2016   BILITOT 0.2 02/04/2016   Lab Results  Component Value Date   WBC 4.8 02/04/2016   HGB 13.5 01/27/2014   HCT 35.8  02/04/2016   MCV 90 02/04/2016   PLT 205 02/04/2016    Imaging Studies: No results found.

## 2016-08-15 NOTE — Assessment & Plan Note (Signed)
Due for surveillance colonoscopy for history of multiple adenomatous colon polyps. Continues to have issues with alternating constipation and diarrhea related to IBS. Discussed need to hold Bentyl if she's having constipation as well as not use MiraLAX as she is having diarrhea. Patient voiced understanding. Plan for colonoscopy with deep sedation in the near future.  I have discussed the risks, alternatives, benefits with regards to but not limited to the risk of reaction to medication, bleeding, infection, perforation and the patient is agreeable to proceed. Written consent to be obtained.  Discussed with social worker, need assistance with ensuring adequate bowel preparation. Patient has been instructed to hold Bentyl 3 days prior to procedure, utilize Dulcolax 10 mg daily 3 days prior to procedure to assist in bowel preparation.

## 2016-08-15 NOTE — Progress Notes (Signed)
CC'ED TO PCP 

## 2016-08-15 NOTE — Telephone Encounter (Signed)
Called social worker [name] [name]) and informed of pt's pre-op 09/04/16 at 12:45pm.

## 2016-08-15 NOTE — Assessment & Plan Note (Signed)
Patient reports recurrent postprandial abdominal pain and nausea and vomiting, solid food dysphagia. She has had these symptoms in the past, previous gallbladder workup unremarkable in 2014. EGD in 2014 with hiatal hernia but otherwise unremarkable. She has come off PPI at some point. Will resume in the way of pantoprazole 40 mg daily. Offered EGD plus or minus esophageal dilation with deep sedation in the OR given polypharmacy.  I have discussed the risks, alternatives, benefits with regards to but not limited to the risk of reaction to medication, bleeding, infection, perforation and the patient is agreeable to proceed. Written consent to be obtained.

## 2016-08-15 NOTE — Patient Instructions (Signed)
No PA needed for TCS/EGD/DIL. Decision ID# [id number]

## 2016-08-15 NOTE — Patient Instructions (Addendum)
1. Upper endoscopy with colonoscopy in near future. See separate instructions.  2. RX sent for pantoprazole (for vomiting, heartburn), bentyl (for loose stools-hold for constipation), miralax (for constipation-hold for diarrhea).  3. Return to the office in six months or sooner if needed.

## 2016-08-24 ENCOUNTER — Ambulatory Visit: Payer: Medicare Other | Attending: Family Medicine | Admitting: Neurology

## 2016-08-24 DIAGNOSIS — L602 Onychogryphosis: Secondary | ICD-10-CM | POA: Diagnosis not present

## 2016-08-24 DIAGNOSIS — G4733 Obstructive sleep apnea (adult) (pediatric): Secondary | ICD-10-CM | POA: Diagnosis not present

## 2016-08-24 DIAGNOSIS — R0683 Snoring: Secondary | ICD-10-CM | POA: Diagnosis not present

## 2016-08-24 DIAGNOSIS — L84 Corns and callosities: Secondary | ICD-10-CM | POA: Diagnosis not present

## 2016-08-24 DIAGNOSIS — Z79899 Other long term (current) drug therapy: Secondary | ICD-10-CM | POA: Insufficient documentation

## 2016-08-24 DIAGNOSIS — E1351 Other specified diabetes mellitus with diabetic peripheral angiopathy without gangrene: Secondary | ICD-10-CM | POA: Diagnosis not present

## 2016-09-01 NOTE — Patient Instructions (Signed)
[name]  09/01/2016     '@[email or web address]'$ @   Your procedure is scheduled on  09/07/2016   Report to [facility] at  [place].M.  Call this number if you have problems the morning of surgery:  [phone]   Remember:  Do not eat food or drink liquids after midnight.  Take these medicines the morning of surgery with A SIP OF WATER  Cogentin, lisinopril, zofran, invega, protonix, zoloft.   Do not wear jewelry, make-up or nail polish.  Do not wear lotions, powders, or perfumes, or deoderant.  Do not shave 48 hours prior to surgery.  Men may shave face and neck.  Do not bring valuables to the hospital.  [facility] is not responsible for any belongings or valuables.  Contacts, dentures or bridgework may not be worn into surgery.  Leave your suitcase in the car.  After surgery it may be brought to your room.  For patients admitted to the hospital, discharge time will be determined by your treatment team.  Patients discharged the day of surgery will not be allowed to drive home.   Name and phone number of your driver:   family Special instructions:  Follow the diet and prep instructions given to you by Dr Roseanne Kaufman office.  Please read over the following fact sheets that you were given. Anesthesia Post-op Instructions and Care and Recovery After Surgery       Esophagogastroduodenoscopy Introduction Esophagogastroduodenoscopy (EGD) is a procedure to examine the lining of the esophagus, stomach, and first part of the small intestine (duodenum). This procedure is done to check for problems such as inflammation, bleeding, ulcers, or growths. During this procedure, a long, flexible, lighted tube with a camera attached (endoscope) is inserted down the throat. Tell a health care provider about:  Any allergies you have.  All medicines you are taking, including vitamins, herbs, eye drops, creams, and over-the-counter medicines.  Any problems you or  family members have had with anesthetic medicines.  Any blood disorders you have.  Any surgeries you have had.  Any medical conditions you have.  Whether you are pregnant or may be pregnant. What are the risks? Generally, this is a safe procedure. However, problems may occur, including:  Infection.  Bleeding.  A tear (perforation) in the esophagus, stomach, or duodenum.  Trouble breathing.  Excessive sweating.  Spasms of the larynx.  A slowed heartbeat.  Low blood pressure. What happens before the procedure?  Follow instructions from your health care provider about eating or drinking restrictions.  Ask your health care provider about:  Changing or stopping your regular medicines. This is especially important if you are taking diabetes medicines or blood thinners.  Taking medicines such as aspirin and ibuprofen. These medicines can thin your blood. Do not take these medicines before your procedure if your health care provider instructs you not to.  Plan to have someone take you home after the procedure.  If you wear dentures, be ready to remove them before the procedure. What happens during the procedure?  To reduce your risk of infection, your health care team will wash or sanitize their hands.  An IV tube will be put in a vein in your hand or arm. You will get medicines and fluids through this tube.  You will be given one or more of the following:  A medicine to help you relax (sedative).  A medicine to  numb the area (local anesthetic). This medicine may be sprayed into your throat. It will make you feel more comfortable and keep you from gagging or coughing during the procedure.  A medicine for pain.  A mouth guard may be placed in your mouth to protect your teeth and to keep you from biting on the endoscope.  You will be asked to lie on your left side.  The endoscope will be lowered down your throat into your esophagus, stomach, and duodenum.  Air will  be put into the endoscope. This will help your health care provider see better.  The lining of your esophagus, stomach, and duodenum will be examined.  Your health care provider may:  Take a tissue sample so it can be looked at in a lab (biopsy).  Remove growths.  Remove objects (foreign bodies) that are stuck.  Treat any bleeding with medicines or other devices that stop tissue from bleeding.  Widen (dilate) or stretch narrowed areas of your esophagus and stomach.  The endoscope will be taken out. The procedure may vary among health care providers and hospitals. What happens after the procedure?  Your blood pressure, heart rate, breathing rate, and blood oxygen level will be monitored often until the medicines you were given have worn off.  Do not eat or drink anything until the numbing medicine has worn off and your gag reflex has returned. This information is not intended to replace advice given to you by your health care provider. Make sure you discuss any questions you have with your health care provider. Document Released: 10/27/2004 Document Revised: 12/02/2015 Document Reviewed: 05/20/2015  2017 Elsevier Esophagogastroduodenoscopy, Care After Introduction Refer to this sheet in the next few weeks. These instructions provide you with information about caring for yourself after your procedure. Your health care provider may also give you more specific instructions. Your treatment has been planned according to current medical practices, but problems sometimes occur. Call your health care provider if you have any problems or questions after your procedure. What can I expect after the procedure? After the procedure, it is common to have:  A sore throat.  Nausea.  Bloating.  Dizziness.  Fatigue. Follow these instructions at home:  Do not eat or drink anything until the numbing medicine (local anesthetic) has worn off and your gag reflex has returned. You will know that  the local anesthetic has worn off when you can swallow comfortably.  Do not drive for 24 hours if you received a medicine to help you relax (sedative).  If your health care provider took a tissue sample for testing during the procedure, make sure to get your test results. This is your responsibility. Ask your health care provider or the department performing the test when your results will be ready.  Keep all follow-up visits as told by your health care provider. This is important. Contact a health care provider if:  You cannot stop coughing.  You are not urinating.  You are urinating less than usual. Get help right away if:  You have trouble swallowing.  You cannot eat or drink.  You have throat or chest pain that gets worse.  You are dizzy or light-headed.  You faint.  You have nausea or vomiting.  You have chills.  You have a fever.  You have severe abdominal pain.  You have black, tarry, or bloody stools. This information is not intended to replace advice given to you by your health care provider. Make sure you discuss any questions  you have with your health care provider. Document Released: 06/12/2012 Document Revised: 12/02/2015 Document Reviewed: 05/20/2015  2017 Elsevier  Esophageal Dilatation Esophageal dilatation is a procedure to open a blocked or narrowed part of the esophagus. The esophagus is the long tube in your throat that carries food and liquid from your mouth to your stomach. The procedure is also called esophageal dilation. You may need this procedure if you have a buildup of scar tissue in your esophagus that makes it difficult, painful, or even impossible to swallow. This can be caused by gastroesophageal reflux disease (GERD). In rare cases, people need this procedure because they have cancer of the esophagus or a problem with the way food moves through the esophagus. Sometimes you may need to have another dilatation to enlarge the opening of the  esophagus gradually. Tell a health care provider about:  Any allergies you have.  All medicines you are taking, including vitamins, herbs, eye drops, creams, and over-the-counter medicines.  Any problems you or family members have had with anesthetic medicines.  Any blood disorders you have.  Any surgeries you have had.  Any medical conditions you have.  Any antibiotic medicines you are required to take before dental procedures. What are the risks? Generally, this is a safe procedure. However, problems can occur and include:  Bleeding from a tear in the lining of the esophagus.  A hole (perforation) in the esophagus. What happens before the procedure?  Do not eat or drink anything after midnight on the night before the procedure or as directed by your health care provider.  Ask your health care provider about changing or stopping your regular medicines. This is especially important if you are taking diabetes medicines or blood thinners.  Plan to have someone take you home after the procedure. What happens during the procedure?  You will be given a medicine that makes you relaxed and sleepy (sedative).  A medicine may be sprayed or gargled to numb the back of the throat.  Your health care provider can use various instruments to do an esophageal dilatation. During the procedure, the instrument used will be placed in your mouth and passed down into your esophagus. Options include:  Simple dilators. This instrument is carefully placed in the esophagus to stretch it.  Guided wire bougies. In this method, a flexible tube (endoscope) is used to insert a wire into the esophagus. The dilator is passed over this wire to enlarge the esophagus. Then the wire is removed.  Balloon dilators. An endoscope with a small balloon at the end is passed down into the esophagus. Inflating the balloon gently stretches the esophagus and opens it up. What happens after the procedure?  Your blood  pressure, heart rate, breathing rate, and blood oxygen level will be monitored often until the medicines you were given have worn off.  Your throat may feel slightly sore and will probably still feel numb. This will improve slowly over time.  You will not be allowed to eat or drink until the throat numbness has resolved.  If this is a same-day procedure, you may be allowed to go home once you have been able to drink, urinate, and sit on the edge of the bed without nausea or dizziness.  If this is a same-day procedure, you should have a friend or family member with you for the next 24 hours after the procedure. This information is not intended to replace advice given to you by your health care provider. Make sure you discuss  any questions you have with your health care provider. Document Released: 08/17/2005 Document Revised: 12/02/2015 Document Reviewed: 11/05/2013 Elsevier Interactive Patient Education  2017 [place].  Colonoscopy, Adult A colonoscopy is an exam to look at the entire large intestine. During the exam, a lubricated, bendable tube is inserted into the anus and then passed into the rectum, colon, and other parts of the large intestine. A colonoscopy is often done as a part of normal colorectal screening or in response to certain symptoms, such as anemia, persistent diarrhea, abdominal pain, and blood in the stool. The exam can help screen for and diagnose medical problems, including:  Tumors.  Polyps.  Inflammation.  Areas of bleeding. Tell a health care provider about:  Any allergies you have.  All medicines you are taking, including vitamins, herbs, eye drops, creams, and over-the-counter medicines.  Any problems you or family members have had with anesthetic medicines.  Any blood disorders you have.  Any surgeries you have had.  Any medical conditions you have.  Any problems you have had passing stool. What are the risks? Generally, this is a safe  procedure. However, problems may occur, including:  Bleeding.  A tear in the intestine.  A reaction to medicines given during the exam.  Infection (rare). What happens before the procedure? Eating and drinking restrictions  Follow instructions from your health care provider about eating and drinking, which may include:  A few days before the procedure - follow a low-fiber diet. Avoid nuts, seeds, dried fruit, raw fruits, and vegetables.  1-3 days before the procedure - follow a clear liquid diet. Drink only clear liquids, such as clear broth or bouillon, black coffee or tea, clear juice, clear soft drinks or sports drinks, gelatin desert, and popsicles. Avoid any liquids that contain red or purple dye.  On the day of the procedure - do not eat or drink anything during the 2 hours before the procedure, or within the time period that your health care provider recommends. Bowel prep  If you were prescribed an oral bowel prep to clean out your colon:  Take it as told by your health care provider. Starting the day before your procedure, you will need to drink a large amount of medicated liquid. The liquid will cause you to have multiple loose stools until your stool is almost clear or light green.  If your skin or anus gets irritated from diarrhea, you may use these to relieve the irritation:  Medicated wipes, such as adult wet wipes with aloe and vitamin E.  A skin soothing-product like petroleum jelly.  If you vomit while drinking the bowel prep, take a break for up to 60 minutes and then begin the bowel prep again. If vomiting continues and you cannot take the bowel prep without vomiting, call your health care provider. General instructions  Ask your health care provider about changing or stopping your regular medicines. This is especially important if you are taking diabetes medicines or blood thinners.  Plan to have someone take you home from the hospital or clinic. What happens  during the procedure?  An IV tube may be inserted into one of your veins.  You will be given medicine to help you relax (sedative).  To reduce your risk of infection:  Your health care team will wash or sanitize their hands.  Your anal area will be washed with soap.  You will be asked to lie on your side with your knees bent.  Your health care provider will  lubricate a long, thin, flexible tube. The tube will have a camera and a light on the end.  The tube will be inserted into your anus.  The tube will be gently eased through your rectum and colon.  Air will be delivered into your colon to keep it open. You may feel some pressure or cramping.  The camera will be used to take images during the procedure.  A small tissue sample may be removed from your body to be examined under a microscope (biopsy). If any potential problems are found, the tissue will be sent to a lab for testing.  If small polyps are found, your health care provider may remove them and have them checked for cancer cells.  The tube that was inserted into your anus will be slowly removed. The procedure may vary among health care providers and hospitals. What happens after the procedure?  Your blood pressure, heart rate, breathing rate, and blood oxygen level will be monitored until the medicines you were given have worn off.  Do not drive for 24 hours after the exam.  You may have a small amount of blood in your stool.  You may pass gas and have mild abdominal cramping or bloating due to the air that was used to inflate your colon during the exam.  It is up to you to get the results of your procedure. Ask your health care provider, or the department performing the procedure, when your results will be ready. This information is not intended to replace advice given to you by your health care provider. Make sure you discuss any questions you have with your health care provider. Document Released: 06/23/2000  Document Revised: 01/14/2016 Document Reviewed: 09/07/2015 Elsevier Interactive Patient Education  2017 [employer]  Colonoscopy, Adult, Care After This sheet gives you information about how to care for yourself after your procedure. Your health care provider may also give you more specific instructions. If you have problems or questions, contact your health care provider. What can I expect after the procedure? After the procedure, it is common to have:  A small amount of blood in your stool for 24 hours after the procedure.  Some gas.  Mild abdominal cramping or bloating. Follow these instructions at home: General instructions  For the first 24 hours after the procedure:  Do not drive or use machinery.  Do not sign important documents.  Do not drink alcohol.  Do your regular daily activities at a slower pace than normal.  Eat soft, easy-to-digest foods.  Rest often.  Take over-the-counter or prescription medicines only as told by your health care provider.  It is up to you to get the results of your procedure. Ask your health care provider, or the department performing the procedure, when your results will be ready. Relieving cramping and bloating  Try walking around when you have cramps or feel bloated.  Apply heat to your abdomen as told by your health care provider. Use a heat source that your health care provider recommends, such as a moist heat pack or a heating pad.  Place a towel between your skin and the heat source.  Leave the heat on for 20-30 minutes.  Remove the heat if your skin turns bright red. This is especially important if you are unable to feel pain, heat, or cold. You may have a greater risk of getting burned. Eating and drinking  Drink enough fluid to keep your urine clear or pale yellow.  Resume your normal diet as  instructed by your health care provider. Avoid heavy or fried foods that are hard to digest.  Avoid drinking alcohol for as long  as instructed by your health care provider. Contact a health care provider if:  You have blood in your stool 2-3 days after the procedure. Get help right away if:  You have more than a small spotting of blood in your stool.  You pass large blood clots in your stool.  Your abdomen is swollen.  You have nausea or vomiting.  You have a fever.  You have increasing abdominal pain that is not relieved with medicine. This information is not intended to replace advice given to you by your health care provider. Make sure you discuss any questions you have with your health care provider. Document Released: 02/08/2004 Document Revised: 03/20/2016 Document Reviewed: 09/07/2015 Elsevier Interactive Patient Education  2017 [place] Anesthesia is a term that refers to techniques, procedures, and medicines that help a person stay safe and comfortable during a medical procedure. Monitored anesthesia care, or sedation, is one type of anesthesia. Your anesthesia specialist may recommend sedation if you will be having a procedure that does not require you to be unconscious, such as:  Cataract surgery.  A dental procedure.  A biopsy.  A colonoscopy. During the procedure, you may receive a medicine to help you relax (sedative). There are three levels of sedation:  Mild sedation. At this level, you may feel awake and relaxed. You will be able to follow directions.  Moderate sedation. At this level, you will be sleepy. You may not remember the procedure.  Deep sedation. At this level, you will be asleep. You will not remember the procedure. The more medicine you are given, the deeper your level of sedation will be. Depending on how you respond to the procedure, the anesthesia specialist may change your level of sedation or the type of anesthesia to fit your needs. An anesthesia specialist will monitor you closely during the procedure. Let your health care provider know  about:  Any allergies you have.  All medicines you are taking, including vitamins, herbs, eye drops, creams, and over-the-counter medicines.  Any use of steroids (by mouth or as a cream).  Any problems you or family members have had with sedatives and anesthetic medicines.  Any blood disorders you have.  Any surgeries you have had.  Any medical conditions you have, such as sleep apnea.  Whether you are pregnant or may be pregnant.  Any use of cigarettes, alcohol, or street drugs. What are the risks? Generally, this is a safe procedure. However, problems may occur, including:  Getting too much medicine (oversedation).  Nausea.  Allergic reaction to medicines.  Trouble breathing. If this happens, a breathing tube may be used to help with breathing. It will be removed when you are awake and breathing on your own.  Heart trouble.  Lung trouble. Before the procedure Staying hydrated  Follow instructions from your health care provider about hydration, which may include:  Up to 2 hours before the procedure - you may continue to drink clear liquids, such as water, clear fruit juice, black coffee, and plain tea. Eating and drinking restrictions  Follow instructions from your health care provider about eating and drinking, which may include:  8 hours before the procedure - stop eating heavy meals or foods such as meat, fried foods, or fatty foods.  6 hours before the procedure - stop eating light meals or foods, such as toast  or cereal.  6 hours before the procedure - stop drinking milk or drinks that contain milk.  2 hours before the procedure - stop drinking clear liquids. Medicines  Ask your health care provider about:  Changing or stopping your regular medicines. This is especially important if you are taking diabetes medicines or blood thinners.  Taking medicines such as aspirin and ibuprofen. These medicines can thin your blood. Do not take these medicines before your  procedure if your health care provider instructs you not to. Tests and exams  You will have a physical exam.  You may have blood tests done to show:  How well your kidneys and liver are working.  How well your blood can clot.  General instructions  Plan to have someone take you home from the hospital or clinic.  If you will be going home right after the procedure, plan to have someone with you for 24 hours. What happens during the procedure?  Your blood pressure, heart rate, breathing, level of pain and overall condition will be monitored.  An IV tube will be inserted into one of your veins.  Your anesthesia specialist will give you medicines as needed to keep you comfortable during the procedure. This may mean changing the level of sedation.  The procedure will be performed. After the procedure  Your blood pressure, heart rate, breathing rate, and blood oxygen level will be monitored until the medicines you were given have worn off.  Do not drive for 24 hours if you received a sedative.  You may:  Feel sleepy, clumsy, or nauseous.  Feel forgetful about what happened after the procedure.  Have a sore throat if you had a breathing tube during the procedure.  Vomit. This information is not intended to replace advice given to you by your health care provider. Make sure you discuss any questions you have with your health care provider. Document Released: 03/22/2005 Document Revised: 12/03/2015 Document Reviewed: 10/17/2015 Elsevier Interactive Patient Education  2017 [place], Care After These instructions provide you with information about caring for yourself after your procedure. Your health care provider may also give you more specific instructions. Your treatment has been planned according to current medical practices, but problems sometimes occur. Call your health care provider if you have any problems or questions after your  procedure. What can I expect after the procedure? After your procedure, it is common to:  Feel sleepy for several hours.  Feel clumsy and have poor balance for several hours.  Feel forgetful about what happened after the procedure.  Have poor judgment for several hours.  Feel nauseous or vomit.  Have a sore throat if you had a breathing tube during the procedure. Follow these instructions at home: For at least 24 hours after the procedure:   Do not:  Participate in activities in which you could fall or become injured.  Drive.  Use heavy machinery.  Drink alcohol.  Take sleeping pills or medicines that cause drowsiness.  Make important decisions or sign legal documents.  Take care of children on your own.  Rest. Eating and drinking  Follow the diet that is recommended by your health care provider.  If you vomit, drink water, juice, or soup when you can drink without vomiting.  Make sure you have little or no nausea before eating solid foods. General instructions  Have a responsible adult stay with you until you are awake and alert.  Take over-the-counter and prescription medicines only as told by  your health care provider.  If you smoke, do not smoke without supervision.  Keep all follow-up visits as told by your health care provider. This is important. Contact a health care provider if:  You keep feeling nauseous or you keep vomiting.  You feel light-headed.  You develop a rash.  You have a fever. Get help right away if:  You have trouble breathing. This information is not intended to replace advice given to you by your health care provider. Make sure you discuss any questions you have with your health care provider. Document Released: 10/17/2015 Document Revised: 02/16/2016 Document Reviewed: 10/17/2015 Elsevier Interactive Patient Education  2017 [employer].

## 2016-09-02 NOTE — Procedures (Signed)
Hopkins A. Merlene Laughter, MD     [email or web address]             NOCTURNAL POLYSOMNOGRAPHY   LOCATION: [facility]   Patient Name: [name], [name] Date: 08/24/2016 Gender: Female D.O.B: 21-Mar-1961 Age (years): 55 Referring Provider: Sallee Lange Height (inches): 70 Interpreting Physician: Phillips Odor MD, ABSM Weight (lbs): 270 RPSGT: Peak, Robert BMI: 39 MRN: [id number] Neck Size: 17.00 CLINICAL INFORMATION Sleep Study Type: NPSG  Indication for sleep study: N/A  Epworth Sleepiness Score: 13  SLEEP STUDY TECHNIQUE As per the AASM Manual for the Scoring of Sleep and Associated Events v2.3 (April 2016) with a hypopnea requiring 4% desaturations.  The channels recorded and monitored were frontal, central and occipital EEG, electrooculogram (EOG), submentalis EMG (chin), nasal and oral airflow, thoracic and abdominal wall motion, anterior tibialis EMG, snore microphone, electrocardiogram, and pulse oximetry.  MEDICATIONS Medications self-administered by patient taken the night of the study : N/A  Current Outpatient Prescriptions:  .  benztropine (COGENTIN) 1 MG tablet, Take 1 tablet (1 mg total) by mouth daily., Disp: 30 tablet, Rfl: 2 .  calcium-vitamin D (OSCAL WITH D) 500-200 MG-UNIT per tablet, Take 1 tablet by mouth 3 (three) times daily.  , Disp: , Rfl:  .  dicyclomine (BENTYL) 10 MG capsule, Take 1 capsule (10 mg total) by mouth 4 (four) times daily -  before meals and at bedtime. For loose stools. HOLD FOR CONSTIPATION., Disp: 120 capsule, Rfl: 3 .  lamoTRIgine (LAMICTAL) 100 MG tablet, Take 1 tablet (100 mg total) by mouth 2 (two) times daily., Disp: 60 tablet, Rfl: 2 .  lisinopril (PRINIVIL,ZESTRIL) 5 MG tablet, TAKE (1) TABLET BY MOUTH ONCE DAILY. (Patient taking differently: Take 5 mg by mouth 3 (three) times daily. ), Disp: 30 tablet, Rfl: 5 .  ondansetron (ZOFRAN ODT) 4 MG disintegrating tablet, Take 1 tablet (4 mg total) by mouth every 8  (eight) hours as needed for nausea or vomiting., Disp: 20 tablet, Rfl: 0 .  paliperidone (INVEGA) 9 MG 24 hr tablet, Take 1 tablet (9 mg total) by mouth daily., Disp: 30 tablet, Rfl: 2 .  pantoprazole (PROTONIX) 40 MG tablet, Take 1 tablet (40 mg total) by mouth daily before breakfast., Disp: 30 tablet, Rfl: 3 .  polyethylene glycol (MIRALAX / GLYCOLAX) packet, Take 17 g by mouth daily as needed for mild constipation. Reported on 01/27/2016, Disp: 30 each, Rfl: 3 .  polyethylene glycol-electrolytes (TRILYTE) 420 g solution, Take 4,000 mLs by mouth as directed., Disp: 4000 mL, Rfl: 0 .  potassium chloride (K-DUR) 10 MEQ tablet, TAKE (1) TABLET TWICE DAILY., Disp: 60 tablet, Rfl: 5 .  sertraline (ZOLOFT) 100 MG tablet, Take 1 tablet (100 mg total) by mouth daily., Disp: 30 tablet, Rfl: 2 .  torsemide (DEMADEX) 20 MG tablet, Take 2 tablets (40 mg total) by mouth every morning., Disp: 60 tablet, Rfl: 5 .  traZODone (DESYREL) 100 MG tablet, Take 1 tablet (100 mg total) by mouth at bedtime., Disp: 30 tablet, Rfl: 2   SLEEP ARCHITECTURE The study was initiated at 10:24:30 PM and ended at 5:03:47 AM.  Sleep onset time was 2.0 minutes and the sleep efficiency was 97.4%. The total sleep time was 388.8 minutes.  Stage REM latency was 237.0 minutes.  The patient spent 2.57% of the night in stage N1 sleep, 85.98% in stage N2 sleep, 1.41% in stage N3 and 10.03% in REM.  Alpha intrusion was absent.  Supine sleep was 47.32%.  RESPIRATORY PARAMETERS The overall  apnea/hypopnea index (AHI) was 16.0 per hour. There were 59 total apneas, including 5 obstructive, 53 central and 1 mixed apneas. There were 45 hypopneas and 22 RERAs.  The AHI during Stage REM sleep was 83.1 per hour.  AHI while supine was 2.9 per hour.  The mean oxygen saturation was 90.30%. The minimum SpO2 during sleep was 67.00%.  Loud snoring was noted during this study.  CARDIAC DATA The 2 lead EKG demonstrated sinus rhythm. The mean  heart rate was N/A beats per minute. Other EKG findings include: None. LEG MOVEMENT DATA The total PLMS were 0 with a resulting PLMS index of 0.00. Associated arousal with leg movement index was 0.0.  IMPRESSIONS - Moderate obstructive sleep apnea worse during REM sleep. AutoPAP 10-20 is suggested. - Clinically significant periodic limb movements did not occur during sleep. No significant associated arousals.  Delano Metz, MD Diplomate, American Board of Sleep Medicine.

## 2016-09-04 ENCOUNTER — Encounter (HOSPITAL_COMMUNITY)
Admission: RE | Admit: 2016-09-04 | Discharge: 2016-09-04 | Disposition: A | Discharge status: Home / Self Care | Payer: Medicare Other | Source: Ambulatory Visit | Attending: Internal Medicine | Admitting: Internal Medicine

## 2016-09-04 ENCOUNTER — Encounter (HOSPITAL_COMMUNITY): Payer: Self-pay

## 2016-09-04 ENCOUNTER — Other Ambulatory Visit: Payer: Self-pay

## 2016-09-04 DIAGNOSIS — I252 Old myocardial infarction: Secondary | ICD-10-CM | POA: Diagnosis not present

## 2016-09-04 DIAGNOSIS — Z01810 Encounter for preprocedural cardiovascular examination: Secondary | ICD-10-CM

## 2016-09-04 DIAGNOSIS — Z1211 Encounter for screening for malignant neoplasm of colon: Secondary | ICD-10-CM | POA: Diagnosis not present

## 2016-09-04 DIAGNOSIS — Z01818 Encounter for other preprocedural examination: Secondary | ICD-10-CM

## 2016-09-04 DIAGNOSIS — R1310 Dysphagia, unspecified: Secondary | ICD-10-CM | POA: Diagnosis not present

## 2016-09-04 DIAGNOSIS — F17210 Nicotine dependence, cigarettes, uncomplicated: Secondary | ICD-10-CM | POA: Diagnosis not present

## 2016-09-04 DIAGNOSIS — F79 Unspecified intellectual disabilities: Secondary | ICD-10-CM | POA: Diagnosis not present

## 2016-09-04 DIAGNOSIS — R9431 Abnormal electrocardiogram [ECG] [EKG]: Secondary | ICD-10-CM

## 2016-09-04 DIAGNOSIS — Z86010 Personal history of colonic polyps: Secondary | ICD-10-CM | POA: Diagnosis not present

## 2016-09-04 DIAGNOSIS — I10 Essential (primary) hypertension: Secondary | ICD-10-CM | POA: Diagnosis not present

## 2016-09-04 DIAGNOSIS — D125 Benign neoplasm of sigmoid colon: Secondary | ICD-10-CM | POA: Diagnosis not present

## 2016-09-04 DIAGNOSIS — K580 Irritable bowel syndrome with diarrhea: Secondary | ICD-10-CM | POA: Diagnosis not present

## 2016-09-04 DIAGNOSIS — E119 Type 2 diabetes mellitus without complications: Secondary | ICD-10-CM | POA: Diagnosis not present

## 2016-09-04 DIAGNOSIS — G4730 Sleep apnea, unspecified: Secondary | ICD-10-CM | POA: Diagnosis not present

## 2016-09-04 DIAGNOSIS — F419 Anxiety disorder, unspecified: Secondary | ICD-10-CM | POA: Diagnosis not present

## 2016-09-04 LAB — CBC WITH DIFFERENTIAL/PLATELET
BASOS PCT: 1 %
Basophils Absolute: 0 10*3/uL (ref 0.0–0.1)
Eosinophils Absolute: 0.1 10*3/uL (ref 0.0–0.7)
Eosinophils Relative: 1 %
HEMATOCRIT: 37.8 % (ref 36.0–46.0)
HEMOGLOBIN: 12.8 g/dL (ref 12.0–15.0)
LYMPHS PCT: 40 %
Lymphs Abs: 2.4 10*3/uL (ref 0.7–4.0)
MCH: 31.2 pg (ref 26.0–34.0)
MCHC: 33.9 g/dL (ref 30.0–36.0)
MCV: 92.2 fL (ref 78.0–100.0)
Monocytes Absolute: 0.7 10*3/uL (ref 0.1–1.0)
Monocytes Relative: 11 %
NEUTROS ABS: 2.8 10*3/uL (ref 1.7–7.7)
NEUTROS PCT: 47 %
Platelets: 222 10*3/uL (ref 150–400)
RBC: 4.1 MIL/uL (ref 3.87–5.11)
RDW: 14.1 % (ref 11.5–15.5)
WBC: 5.9 10*3/uL (ref 4.0–10.5)

## 2016-09-04 LAB — BASIC METABOLIC PANEL
ANION GAP: 9 (ref 5–15)
BUN: 9 mg/dL (ref 6–20)
CHLORIDE: 98 mmol/L — AB (ref 101–111)
CO2: 29 mmol/L (ref 22–32)
Calcium: 8.9 mg/dL (ref 8.9–10.3)
Creatinine, Ser: 0.94 mg/dL (ref 0.44–1.00)
GFR calc non Af Amer: >60 mL/min (ref >60)
Glucose, Bld: 77 mg/dL (ref 65–99)
POTASSIUM: 3.9 mmol/L (ref 3.5–5.1)
Sodium: 136 mmol/L (ref 135–145)

## 2016-09-04 MED ORDER — PEG 3350-KCL-NA BICARB-NACL 420 G PO SOLR: 4000.0000 mL | ORAL | 0 refills | Status: DC

## 2016-09-05 ENCOUNTER — Telehealth: Payer: Self-pay | Admitting: Family Medicine

## 2016-09-05 NOTE — Telephone Encounter (Signed)
May go forward with a prescription for auto CPAP pressures from 10-24 diagnosis of moderate sleep apnea

## 2016-09-05 NOTE — Telephone Encounter (Signed)
Sleep study results are ready  (just need to order machine)  Please adivse & I will write out order for signature so it can be faxed with all required documentation

## 2016-09-06 NOTE — Telephone Encounter (Signed)
Addendum was completed thank you

## 2016-09-06 NOTE — Telephone Encounter (Signed)
Please make an addendum to OV 07/31/16 for F2F for CPAP Spoke with Mariann Laster at [facility] to make sure we would be  Information needed  Mariann Laster states that patient turned in her previous CPAP because Medicare stopped paying for it due to non-compliance  Mariann Laster asked if we could remove the statement that the machine was broken   And state that pt was non compliant and was unable to tolerate pressure and recommend new sleep study  That along with sleep study results will be sufficient per Medicare guidelines for patient to qualify for a Auto CPAP

## 2016-09-06 NOTE — Telephone Encounter (Signed)
Please sign order for Auto CPAP in red folder in yellow box  Forward to [place] to be faxed with all required documentation

## 2016-09-07 ENCOUNTER — Ambulatory Visit (HOSPITAL_COMMUNITY): Payer: Medicare Other | Admitting: Anesthesiology

## 2016-09-07 ENCOUNTER — Encounter (HOSPITAL_COMMUNITY)
Admission: RE | Disposition: A | Discharge status: Home / Self Care | Payer: Self-pay | Source: Ambulatory Visit | Attending: Internal Medicine

## 2016-09-07 ENCOUNTER — Encounter (HOSPITAL_COMMUNITY): Payer: Self-pay

## 2016-09-07 ENCOUNTER — Ambulatory Visit (HOSPITAL_COMMUNITY)
Admission: RE | Admit: 2016-09-07 | Discharge: 2016-09-07 | Disposition: A | Discharge status: Home / Self Care | Payer: Medicare Other | Source: Ambulatory Visit | Attending: Internal Medicine | Admitting: Internal Medicine

## 2016-09-07 DIAGNOSIS — K635 Polyp of colon: Secondary | ICD-10-CM

## 2016-09-07 DIAGNOSIS — I10 Essential (primary) hypertension: Secondary | ICD-10-CM | POA: Diagnosis not present

## 2016-09-07 DIAGNOSIS — K219 Gastro-esophageal reflux disease without esophagitis: Secondary | ICD-10-CM | POA: Diagnosis not present

## 2016-09-07 DIAGNOSIS — K589 Irritable bowel syndrome without diarrhea: Secondary | ICD-10-CM | POA: Diagnosis not present

## 2016-09-07 DIAGNOSIS — F419 Anxiety disorder, unspecified: Secondary | ICD-10-CM | POA: Insufficient documentation

## 2016-09-07 DIAGNOSIS — R1310 Dysphagia, unspecified: Secondary | ICD-10-CM | POA: Insufficient documentation

## 2016-09-07 DIAGNOSIS — G4730 Sleep apnea, unspecified: Secondary | ICD-10-CM | POA: Diagnosis not present

## 2016-09-07 DIAGNOSIS — Z86010 Personal history of colonic polyps: Secondary | ICD-10-CM | POA: Diagnosis not present

## 2016-09-07 DIAGNOSIS — D125 Benign neoplasm of sigmoid colon: Secondary | ICD-10-CM | POA: Insufficient documentation

## 2016-09-07 DIAGNOSIS — I252 Old myocardial infarction: Secondary | ICD-10-CM | POA: Insufficient documentation

## 2016-09-07 DIAGNOSIS — F79 Unspecified intellectual disabilities: Secondary | ICD-10-CM | POA: Insufficient documentation

## 2016-09-07 DIAGNOSIS — Z1211 Encounter for screening for malignant neoplasm of colon: Secondary | ICD-10-CM | POA: Insufficient documentation

## 2016-09-07 DIAGNOSIS — F17210 Nicotine dependence, cigarettes, uncomplicated: Secondary | ICD-10-CM | POA: Insufficient documentation

## 2016-09-07 DIAGNOSIS — E119 Type 2 diabetes mellitus without complications: Secondary | ICD-10-CM | POA: Diagnosis not present

## 2016-09-07 DIAGNOSIS — K580 Irritable bowel syndrome with diarrhea: Secondary | ICD-10-CM | POA: Insufficient documentation

## 2016-09-07 HISTORY — PX: ESOPHAGOGASTRODUODENOSCOPY (EGD) WITH PROPOFOL: SHX5813

## 2016-09-07 HISTORY — PX: COLONOSCOPY WITH PROPOFOL: SHX5780

## 2016-09-07 HISTORY — PX: POLYPECTOMY: SHX5525

## 2016-09-07 HISTORY — PX: MALONEY DILATION: SHX5535

## 2016-09-07 LAB — GLUCOSE, CAPILLARY
GLUCOSE-CAPILLARY: 72 mg/dL (ref 65–99)
GLUCOSE-CAPILLARY: 127 mg/dL — AB (ref 65–99)
GLUCOSE-CAPILLARY: 100 mg/dL — AB (ref 65–99)

## 2016-09-07 SURGERY — COLONOSCOPY WITH PROPOFOL
Anesthesia: Monitor Anesthesia Care

## 2016-09-07 MED ORDER — MIDAZOLAM HCL 5 MG/5ML IJ SOLN
INTRAMUSCULAR | Status: DC | PRN
  Administered 2016-09-07: 2 mg via INTRAVENOUS

## 2016-09-07 MED ORDER — MIDAZOLAM HCL 2 MG/2ML IJ SOLN
1.0000 mg | INTRAMUSCULAR | Status: AC
  Administered 2016-09-07: 2 mg via INTRAVENOUS

## 2016-09-07 MED ORDER — DEXTROSE 50 % IV SOLN
12.5000 g | Freq: Once | INTRAVENOUS | Status: AC
  Administered 2016-09-07: 12.5 g via INTRAVENOUS

## 2016-09-07 MED ORDER — MIDAZOLAM HCL 2 MG/2ML IJ SOLN
INTRAMUSCULAR | Status: AC
Start: 2016-09-07 — End: 2016-09-07
  Filled 2016-09-07: qty 2

## 2016-09-07 MED ORDER — FENTANYL CITRATE (PF) 100 MCG/2ML IJ SOLN
25.0000 ug | INTRAMUSCULAR | Status: AC
  Administered 2016-09-07 (×2): 25 ug via INTRAVENOUS

## 2016-09-07 MED ORDER — FENTANYL CITRATE (PF) 100 MCG/2ML IJ SOLN
INTRAMUSCULAR | Status: AC
  Filled 2016-09-07: qty 2

## 2016-09-07 MED ORDER — LIDOCAINE VISCOUS 2 % MT SOLN
6.0000 mL | Freq: Once | OROMUCOSAL | Status: AC
  Administered 2016-09-07: 6 mL via OROMUCOSAL

## 2016-09-07 MED ORDER — LIDOCAINE VISCOUS 2 % MT SOLN
OROMUCOSAL | Status: AC
  Filled 2016-09-07: qty 15

## 2016-09-07 MED ORDER — PROPOFOL 500 MG/50ML IV EMUL
INTRAVENOUS | Status: DC | PRN
  Administered 2016-09-07: 75 ug/kg/min via INTRAVENOUS
  Administered 2016-09-07: 10:00:00 via INTRAVENOUS

## 2016-09-07 MED ORDER — LACTATED RINGERS IV SOLN
INTRAVENOUS | Status: DC
  Administered 2016-09-07: 09:00:00 via INTRAVENOUS

## 2016-09-07 MED ORDER — DEXTROSE 50 % IV SOLN
INTRAVENOUS | Status: AC
  Filled 2016-09-07: qty 50

## 2016-09-07 MED ORDER — MIDAZOLAM HCL 2 MG/2ML IJ SOLN
INTRAMUSCULAR | Status: AC
  Filled 2016-09-07: qty 2

## 2016-09-07 NOTE — Op Note (Signed)
[facility] Patient Name: [name] Procedure Date: 09/07/2016 8:48 AM MRN: [id number] Date of Birth: 1961/03/09 Attending MD: Norvel Richards , MD CSN: [id number] Age: 56 Admit Type: Outpatient Procedure:                Upper GI endoscopy with [facility] dilation Indications:              Dysphagia Providers:                Norvel Richards, MD, Lurline Del, RN, Aram Candela Referring MD:              Medicines:                Propofol per Anesthesia Complications:            No immediate complications. Estimated Blood Loss:     Estimated blood loss: none. Procedure:                Pre-Anesthesia Assessment:                           - Prior to the procedure, a History and Physical                            was performed, and patient medications and                            allergies were reviewed. The patient's tolerance of                            previous anesthesia was also reviewed. The risks                            and benefits of the procedure and the sedation                            options and risks were discussed with the patient.                            All questions were answered, and informed consent                            was obtained. Prior Anticoagulants: The patient has                            taken no previous anticoagulant or antiplatelet                            agents. ASA Grade Assessment: II - A patient with                            mild systemic disease. After reviewing the risks  and benefits, the patient was deemed in                            satisfactory condition to undergo the procedure.                           After obtaining informed consent, the endoscope was                            passed under direct vision. Throughout the                            procedure, the patient's blood pressure, pulse, and                            oxygen saturations were  monitored continuously. The                            [id number] ([id number]) scope was introduced through the                            and advanced to the second part of duodenum. The                            upper GI endoscopy was accomplished without                            difficulty. The patient tolerated the procedure                            well. Scope In: 9:19:45 AM Scope Out: 9:25:10 AM Total Procedure Duration: 0 hours 5 minutes 25 seconds  Findings:      The examined esophagus was normal.      Stomach was normal.      The duodenal bulb and second portion of the duodenum were normal. The       scope was withdrawn. Dilation was performed with a Maloney dilator with       no resistance at 22 Fr. The scope was withdrawn. Dilation was performed       with a Maloney dilator with mild resistance at 56 Fr. The dilation site       was examined following endoscope reinsertion and showed no change.       Estimated blood loss: none. Impression:               - Normal esophagus. Dilated.                           - Normal stomach.                           - Normal duodenal bulb and second portion of the                            duodenum.                           -  No specimens collected. Moderate Sedation:      Moderate (conscious) sedation was personally administered by an       anesthesia professional. The following parameters were monitored: oxygen       saturation, heart rate, blood pressure, respiratory rate, EKG, adequacy       of pulmonary ventilation, and response to care. Total physician       intraservice time was 6 minutes.      Moderate (conscious) sedation was personally administered by an       anesthesia professional. The following parameters were monitored: oxygen       saturation, heart rate, blood pressure, respiratory rate, EKG, adequacy       of pulmonary ventilation, and response to care. Total physician       intraservice time was 6  minutes. Recommendation:           - Patient has a contact number available for                            emergencies. The signs and symptoms of potential                            delayed complications were discussed with the                            patient. Return to normal activities tomorrow.                            Written discharge instructions were provided to the                            patient.                           - Resume previous diet.                           - Continue present medications.                           - Use Protonix (pantoprazole) 40 mg PO daily. See                            colonoscopy report. Procedure Code(s):        --- Professional ---                           [phone], Esophagogastroduodenoscopy, flexible,                            transoral; diagnostic, including collection of                            specimen(s) by brushing or washing, when performed                            (separate procedure)  43450, Dilation of esophagus, by unguided sound or                            bougie, single or multiple passes Diagnosis Code(s):        --- Professional ---                           R13.10, Dysphagia, unspecified CPT copyright 2016 American Medical Association. All rights reserved. The codes documented in this report are preliminary and upon coder review may  be revised to meet current compliance requirements. [name]. Toma Erichsen, MD Norvel Richards, MD 09/07/2016 10:01:46 AM This report has been signed electronically. Number of Addenda: 0

## 2016-09-07 NOTE — H&P (View-Only) (Signed)
Primary Care Physician: Sallee Lange, MD  Primary Gastroenterologist:  Garfield Cornea, MD   Chief Complaint  Patient presents with  . Colonoscopy    set up 3 yr TCS  . Diarrhea    HPI: [name] is a 56 y.o. female here to schedule three-year surveillance colonoscopy. She was last seen in 2014 at time of her last colonoscopy. She had a couple of tubular adenomas, multiple hyperplastic appearing polyps. At baseline she has some mental retardation. She has a history of IBS with alternating constipation and diarrhea. She has been on Bentyl and MiraLAX in the past. She states she has run out of medication. She needs refills. History is potentially somewhat unreliable. She tells me that she has had episodes of going several weeks without a bowel movement but then goes daily at times. Currently she states she has more diarrhea than constipation. Abdominal cramping somewhat relieved with bowel movement. In addition she does have a few supper abdominal discomfort, feels like she's been "punched". Intermittent nausea and vomiting several days weekly. History of bright red blood per rectum and questionable melena but none in the recent weeks. Complains of dysphagia to solid foods. Some heartburn. No longer on Nexium, "I ran out".   Currently she is struggling somewhat with her medications. She is in transition waiting for a new CNA. In the past CNA's have helped her get through her bowel prep. Discussed at length with the social worker who is present today. She ensures me that she is able to assist patient with understanding her bowel prep instructions. Patient is compliant but she has some difficulty reading.  Patient reports taking BC powders recently for headache. Does not take daily.  Current Outpatient Prescriptions  Medication Sig Dispense Refill  . benztropine (COGENTIN) 1 MG tablet Take 1 tablet (1 mg total) by mouth daily. 30 tablet 2  . calcium-vitamin D (OSCAL WITH D) 500-200  MG-UNIT per tablet Take 1 tablet by mouth 3 (three) times daily.      Marland Kitchen dicyclomine (BENTYL) 10 MG capsule TAKE 1 CAPSULE 4 TIMES DAILY BEFORE MEALS AND AT BEDTIME. 120 capsule 0  . lamoTRIgine (LAMICTAL) 100 MG tablet Take 1 tablet (100 mg total) by mouth 2 (two) times daily. 60 tablet 2  . lisinopril (PRINIVIL,ZESTRIL) 5 MG tablet TAKE (1) TABLET BY MOUTH ONCE DAILY. (Patient taking differently: Take 5 mg by mouth 3 (three) times daily. TAKE (1) TABLET BY MOUTH ONCE DAILY.) 30 tablet 5  . ondansetron (ZOFRAN ODT) 4 MG disintegrating tablet Take 1 tablet (4 mg total) by mouth every 8 (eight) hours as needed for nausea or vomiting. 20 tablet 0  . paliperidone (INVEGA) 9 MG 24 hr tablet Take 1 tablet (9 mg total) by mouth daily. 30 tablet 2  . polyethylene glycol (MIRALAX / GLYCOLAX) packet Take 17 g by mouth daily as needed for mild constipation. Reported on 01/27/2016    . potassium chloride (K-DUR) 10 MEQ tablet TAKE (1) TABLET TWICE DAILY. 60 tablet 5  . sertraline (ZOLOFT) 100 MG tablet Take 1 tablet (100 mg total) by mouth daily. 30 tablet 2  . torsemide (DEMADEX) 20 MG tablet Take 2 tablets (40 mg total) by mouth every morning. 60 tablet 5  . traZODone (DESYREL) 100 MG tablet Take 1 tablet (100 mg total) by mouth at bedtime. 30 tablet 2   No current facility-administered medications for this visit.     Allergies as of 08/15/2016 - Review Complete 08/15/2016  Allergen  Reaction Noted  . Thorazine [chlorpromazine hcl] Anaphylaxis 10/20/2010  . Acetaminophen Other (See Comments)   . Aspirin Other (See Comments)   . Aspirin-acetaminophen-caffeine Other (See Comments)   . Nsaids Nausea And Vomiting   . Penicillins Nausea And Vomiting   . Tomato Rash 01/17/2012   Past Medical History:  Diagnosis Date  . Anxiety   . Diabetes mellitus   . Diabetes mellitus, type II ([place])   . HTN (hypertension)   . Hyperglycemia   . IBS (irritable bowel syndrome)   . Lung nodules    right, followed by  PCP, PET 11/2011  . Mental retardation   . MI (myocardial infarction)   . Migraines   . Sleep apnea    Past Surgical History:  Procedure Laterality Date  . ABDOMINAL HYSTERECTOMY    . COLONOSCOPY  11/2007   hyperplastic polyps, prior hx of adenomas   . COLONOSCOPY  05/2010   incomplete due to poor prep, hyperplastic rectal polyp  . COLONOSCOPY  05/05/2002   Dimunitive polyps in the rectum and left colon, cold    biopsied/removed.  Scattered few left-sided diverticula.  Regular colonic   mucosa appeared normal  . COLONOSCOPY N/A 05/28/2013   [facility]: mulitple tubular adenomas removed. next tcs 05/2016  . ESOPHAGOGASTRODUODENOSCOPY  08/2007   moderate sized hiatal hernia  . ESOPHAGOGASTRODUODENOSCOPY  05/2010   noncritical schatzki ring s/p 35F  . ESOPHAGOGASTRODUODENOSCOPY (EGD) WITH ESOPHAGEAL DILATION N/A 02/06/2013   NAT:FTDDUK esophagus-s/p dilation up to a 13 [place] size with [facility] dilators.  Hiatal hernia  . EXTERNAL EAR SURGERY     bilateral  . FOOT SURGERY    . GLAUCOMA SURGERY    . small bowel capsule  10/2007   normal   Family History  Problem Relation Age of Onset  . Stroke Mother   . Heart attack Father   . Schizophrenia Other   . Drug abuse Other   . Alcohol abuse Other   . Colon cancer      aunt  . Obesity    . COPD    . GER disease    . Diabetes type II    . Anxiety disorder    . Depression    . Depression Sister   . Schizophrenia Sister   . Liver disease Neg Hx   . Inflammatory bowel disease Neg Hx    Social History   Social History  . Marital status: Divorced    Spouse name: N/A  . Number of children: N/A  . Years of education: N/A   Occupational History  . disabled Unemployed   Social History Main Topics  . Smoking status: Current Every Day Smoker    Packs/day: 0.25    Years: 40.00    Types: Cigarettes  . Smokeless tobacco: Never Used  . Alcohol use No     Comment: social drinker  . Drug use: No  . Sexual activity: Yes    Partners:  Male    Birth control/ protection: None     Comment: boyfriend   Other Topics Concern  . None   Social History Narrative  . None    ROS:  General: Negative for anorexia, weight loss, fever, chills, fatigue, weakness. ENT: Negative for hoarseness,  nasal congestion.See history of present illness CV: Negative for chest pain, angina, palpitations, dyspnea on exertion, peripheral edema.  Respiratory: Negative for dyspnea at rest, dyspnea on exertion, cough, sputum, wheezing.  GI: See history of present illness. GU:  Negative for dysuria, hematuria, urinary  incontinence, urinary frequency, nocturnal urination.  Endo: Negative for unusual weight change.    Physical Examination:   BP 113/75   Pulse 87   Temp 98.1 F (36.7 C) (Oral)   Ht 5' 10.5" (1.791 m)   Wt 262 lb 6.4 oz (119 kg)   BMI 37.12 kg/m   General: Well-nourished, well-developed in no acute distress. Breathing by Education officer, museum. Eyes: No icterus. Mouth: Oropharyngeal mucosa moist and pink , no lesions erythema or exudate. Lungs: Clear to auscultation bilaterally.  Heart: Regular rate and rhythm, no murmurs rubs or gallops.  Abdomen: Bowel sounds are normal, diffuse abdominal discomfort with deep palpation, increased in epigastrium, nondistended, no hepatosplenomegaly or masses, no abdominal bruits or hernia , no rebound or guarding.   Extremities: No lower extremity edema. No clubbing or deformities. Neuro: Alert and oriented x 4   Skin: Warm and dry, no jaundice.   Psych: Alert and cooperative, normal mood and affect.  Labs:  Lab Results  Component Value Date   CREATININE 0.86 02/04/2016   BUN 8 02/04/2016   NA 138 02/04/2016   K 4.0 02/04/2016   CL 98 02/04/2016   CO2 28 02/04/2016   Lab Results  Component Value Date   ALT 21 02/04/2016   AST 25 02/04/2016   ALKPHOS 84 02/04/2016   BILITOT 0.2 02/04/2016   Lab Results  Component Value Date   WBC 4.8 02/04/2016   HGB 13.5 01/27/2014   HCT 35.8  02/04/2016   MCV 90 02/04/2016   PLT 205 02/04/2016    Imaging Studies: No results found.

## 2016-09-07 NOTE — Op Note (Signed)
[facility] Patient Name: [name] Procedure Date: 09/07/2016 9:28 AM MRN: [id number] Date of Birth: [birth year] Attending MD: Norvel Richards , MD CSN: [id number] Age: 56 Admit Type: Outpatient Procedure:                Colonoscopy with snare polypectomy Indications:              High risk colon cancer surveillance: Personal                            history of colonic polyps Providers:                Norvel Richards, MD, Lurline Del, RN, Aram Candela Referring MD:              Medicines:                Propofol per Anesthesia Complications:            No immediate complications. Estimated Blood Loss:     Estimated blood loss was minimal. Procedure:                Pre-Anesthesia Assessment:                           - Prior to the procedure, a History and Physical                            was performed, and patient medications and                            allergies were reviewed. The patient's tolerance of                            previous anesthesia was also reviewed. The risks                            and benefits of the procedure and the sedation                            options and risks were discussed with the patient.                            All questions were answered, and informed consent                            was obtained. Prior Anticoagulants: The patient has                            taken no previous anticoagulant or antiplatelet                            agents. ASA Grade Assessment: II - A patient with  mild systemic disease. After reviewing the risks                            and benefits, the patient was deemed in                            satisfactory condition to undergo the procedure.                           After obtaining informed consent, the colonoscope                            was passed under direct vision. Throughout the                            procedure, the  patient's blood pressure, pulse, and                            oxygen saturations were monitored continuously. The                            Colonoscope was introduced through the and advanced                            to the the cecum, identified by appendiceal orifice                            and ileocecal valve. The ileocecal valve,                            appendiceal orifice, and rectum were photographed.                            The quality of the bowel preparation was adequate. Scope In: 9:31:19 AM Scope Out: 9:53:04 AM Scope Withdrawal Time: 0 hours 9 minutes 13 seconds  Total Procedure Duration: 0 hours 21 minutes 45 seconds  Findings:      The perianal and digital rectal examinations were normal.      (4) 5 mm polyps found in the sigmoid colon. Semi-pedunculated. polyps       removed with cold snare. Resection and retrieval were complete.       Estimated blood loss was minimal. Multiple areas of hyperplastic       mamillation's ihe sigmoid and rectum not manipulated. Impression:               - Multiple polyps?"removed as described above,                            Resected and retrieved. Moderate Sedation:      Moderate (conscious) sedation was personally administered by an       anesthesia professional. The following parameters were monitored: oxygen       saturation, heart rate, blood pressure, respiratory rate, EKG, adequacy       of pulmonary ventilation, and response to care. Total physician       intraservice time was 41 minutes.  Recommendation:           - Patient has a contact number available for                            emergencies. The signs and symptoms of potential                            delayed complications were discussed with the                            patient. Return to normal activities tomorrow.                            Written discharge instructions were provided to the                            patient.                           -  Resume previous diet.                           - Continue present medications.                           - Await pathology results.                           - Repeat colonoscopy date to be determined after                            pending pathology results are reviewed for                            surveillance based on pathology results.                           - Return to GI office in 3 months. Procedure Code(s):        --- Professional ---                           [phone], Colonoscopy, flexible; with removal of                            tumor(s), polyp(s), or other lesion(s) by snare                            technique Diagnosis Code(s):        --- Professional ---                           Z86.010, Personal history of colonic polyps                           D12.5, Benign neoplasm of sigmoid colon CPT copyright 2016 [employer]. All rights reserved. The codes documented in  this report are preliminary and upon coder review may  be revised to meet current compliance requirements. [name]. [name], MD Norvel Richards, MD 09/07/2016 10:06:24 AM This report has been signed electronically. Number of Addenda: 0

## 2016-09-07 NOTE — Anesthesia Postprocedure Evaluation (Signed)
Anesthesia Post Note  Patient: [name]  Procedure(s) Performed: Procedure(s) (LRB): COLONOSCOPY WITH PROPOFOL (N/A) ESOPHAGOGASTRODUODENOSCOPY (EGD) WITH PROPOFOL (N/A) MALONEY DILATION (N/A) POLYPECTOMY  Patient location during evaluation: PACU Anesthesia Type: MAC Level of consciousness: sedated Pain management: pain level controlled Vital Signs Assessment: post-procedure vital signs reviewed and stable Respiratory status: spontaneous breathing, nonlabored ventilation and respiratory function stable Cardiovascular status: blood pressure returned to baseline Postop Assessment: no signs of nausea or vomiting Anesthetic complications: no     Last Vitals:  Vitals:   09/07/16 0900 09/07/16 0904  BP:  124/64  Pulse:    Resp: 16 (!) 36  Temp:      Last Pain:  Vitals:   09/07/16 0809  TempSrc: Oral                 Miki Labuda J

## 2016-09-07 NOTE — Discharge Instructions (Signed)
Colonoscopy Discharge Instructions  Read the instructions outlined below and refer to this sheet in the next few weeks. These discharge instructions provide you with general information on caring for yourself after you leave the hospital. Your doctor may also give you specific instructions. While your treatment has been planned according to the most current medical practices available, unavoidable complications occasionally occur. If you have any problems or questions after discharge, call Dr. Gala Romney at [phone]. ACTIVITY  You may resume your regular activity, but move at a slower pace for the next 24 hours.   Take frequent rest periods for the next 24 hours.   Walking will help get rid of the air and reduce the bloated feeling in your belly (abdomen).   No driving for 24 hours (because of the medicine (anesthesia) used during the test).    Do not sign any important legal documents or operate any machinery for 24 hours (because of the anesthesia used during the test).  NUTRITION  Drink plenty of fluids.   You may resume your normal diet as instructed by your doctor.   Begin with a light meal and progress to your normal diet. Heavy or fried foods are harder to digest and may make you feel sick to your stomach (nauseated).   Avoid alcoholic beverages for 24 hours or as instructed.  MEDICATIONS  You may resume your normal medications unless your doctor tells you otherwise.  WHAT YOU CAN EXPECT TODAY  Some feelings of bloating in the abdomen.   Passage of more gas than usual.   Spotting of blood in your stool or on the toilet paper.  IF YOU HAD POLYPS REMOVED DURING THE COLONOSCOPY:  No aspirin products for 7 days or as instructed.   No alcohol for 7 days or as instructed.   Eat a soft diet for the next 24 hours.  FINDING OUT THE RESULTS OF YOUR TEST Not all test results are available during your visit. If your test results are not back during the visit, make an appointment  with your caregiver to find out the results. Do not assume everything is normal if you have not heard from your caregiver or the medical facility. It is important for you to follow up on all of your test results.  SEEK IMMEDIATE MEDICAL ATTENTION IF:  You have more than a spotting of blood in your stool.   Your belly is swollen (abdominal distention).   You are nauseated or vomiting.   You have a temperature over 101.   You have abdominal pain or discomfort that is severe or gets worse throughout the day.    EGD Discharge instructions Please read the instructions outlined below and refer to this sheet in the next few weeks. These discharge instructions provide you with general information on caring for yourself after you leave the hospital. Your doctor may also give you specific instructions. While your treatment has been planned according to the most current medical practices available, unavoidable complications occasionally occur. If you have any problems or questions after discharge, please call your doctor. ACTIVITY  You may resume your regular activity but move at a slower pace for the next 24 hours.   Take frequent rest periods for the next 24 hours.   Walking will help expel (get rid of) the air and reduce the bloated feeling in your abdomen.   No driving for 24 hours (because of the anesthesia (medicine) used during the test).   You may shower.   Do not sign  any important legal documents or operate any machinery for 24 hours (because of the anesthesia used during the test).  NUTRITION  Drink plenty of fluids.   You may resume your normal diet.   Begin with a light meal and progress to your normal diet.   Avoid alcoholic beverages for 24 hours or as instructed by your caregiver.  MEDICATIONS  You may resume your normal medications unless your caregiver tells you otherwise.  WHAT YOU CAN EXPECT TODAY  You may experience abdominal discomfort such as a feeling of  fullness or gas pains.  FOLLOW-UP  Your doctor will discuss the results of your test with you.  SEEK IMMEDIATE MEDICAL ATTENTION IF ANY OF THE FOLLOWING OCCUR:  Excessive nausea (feeling sick to your stomach) and/or vomiting.   Severe abdominal pain and distention (swelling).   Trouble swallowing.   Temperature over 101 F (37.8 C).   Rectal bleeding or vomiting of blood.     Continue Protonix 40 mg daily  Colon polyp information provided  Office visit with [place] in 3 months  Further recommendations to follow pending review of pathology report

## 2016-09-07 NOTE — Transfer of Care (Signed)
Immediate Anesthesia Transfer of Care Note  Patient: [name]  Procedure(s) Performed: Procedure(s) with comments: COLONOSCOPY WITH PROPOFOL (N/A) - 9:30am ESOPHAGOGASTRODUODENOSCOPY (EGD) WITH PROPOFOL (N/A) MALONEY DILATION (N/A) POLYPECTOMY - sigmoid colon x4  Patient Location: PACU  Anesthesia Type:MAC  Level of Consciousness: sedated  Airway & Oxygen Therapy: Patient Spontanous Breathing and Patient connected to face mask oxygen  Post-op Assessment: Report given to RN, Post -op Vital signs reviewed and stable and Patient moving all extremities  Post vital signs: Reviewed and stable  Last Vitals:  Vitals:   09/07/16 0900 09/07/16 0904  BP:  124/64  Pulse:    Resp: 16 (!) 36  Temp:      Last Pain:  Vitals:   09/07/16 0809  TempSrc: Oral         Complications: No apparent anesthesia complications

## 2016-09-07 NOTE — Anesthesia Preprocedure Evaluation (Signed)
Anesthesia Evaluation  Patient identified by MRN, date of birth, ID band Patient awake    Reviewed: Allergy & Precautions, NPO status , Patient's Chart, lab work & pertinent test results  Airway Mallampati: II  TM Distance: >3 FB Neck ROM: Full    Dental  (+) Edentulous Upper, Edentulous Lower   Pulmonary sleep apnea , Current Smoker,    breath sounds clear to auscultation       Cardiovascular hypertension, Pt. on medications + Past MI   Rhythm:Regular Rate:Normal     Neuro/Psych  Headaches, PSYCHIATRIC DISORDERS ( Mental retardation) Anxiety    GI/Hepatic GERD  ,  Endo/Other  diabetes, Type 2, Oral Hypoglycemic Agents  Renal/GU      Musculoskeletal   Abdominal   Peds  Hematology   Anesthesia Other Findings   Reproductive/Obstetrics                             Anesthesia Physical Anesthesia Plan  ASA: III  Anesthesia Plan: MAC   Post-op Pain Management:    Induction: Intravenous  Airway Management Planned: Simple Face Mask  Additional Equipment:   Intra-op Plan:   Post-operative Plan:   Informed Consent: I have reviewed the patients History and Physical, chart, labs and discussed the procedure including the risks, benefits and alternatives for the proposed anesthesia with the patient or authorized representative who has indicated his/her understanding and acceptance.     Plan Discussed with:   Anesthesia Plan Comments:         Anesthesia Quick Evaluation

## 2016-09-07 NOTE — Interval H&P Note (Signed)
History and Physical Interval Note:  09/07/2016 8:17 AM  [name]  has presented today for surgery, with the diagnosis of history colon polyps, IBS, n/v, dysphagia, abd pain  The various methods of treatment have been discussed with the patient and family. After consideration of risks, benefits and other options for treatment, the patient has consented to  Procedure(s) with comments: COLONOSCOPY WITH PROPOFOL (N/A) - 9:30am ESOPHAGOGASTRODUODENOSCOPY (EGD) WITH PROPOFOL (N/A) MALONEY DILATION (N/A) as a surgical intervention .  The patient's history has been reviewed, patient examined, no change in status, stable for surgery.  I have reviewed the patient's chart and labs.  Questions were answered to the patient's satisfaction.     Param Capri  No change. Did not get PPI prescription per patient report. EGD/ED and surveillance colonoscopy per plan.  The risks, benefits, limitations, imponderables and alternatives regarding both EGD and colonoscopy have been reviewed with the patient. Questions have been answered. All parties agreeable.

## 2016-09-10 ENCOUNTER — Encounter: Payer: Self-pay | Admitting: Internal Medicine

## 2016-09-11 ENCOUNTER — Encounter (HOSPITAL_COMMUNITY): Payer: Self-pay | Admitting: Internal Medicine

## 2016-09-11 ENCOUNTER — Other Ambulatory Visit: Payer: Self-pay | Admitting: Family Medicine

## 2016-09-11 ENCOUNTER — Other Ambulatory Visit (HOSPITAL_COMMUNITY): Payer: Self-pay | Admitting: Psychiatry

## 2016-09-14 ENCOUNTER — Other Ambulatory Visit: Payer: Self-pay | Admitting: Cardiothoracic Surgery

## 2016-09-14 DIAGNOSIS — R911 Solitary pulmonary nodule: Secondary | ICD-10-CM

## 2016-09-19 ENCOUNTER — Encounter (HOSPITAL_COMMUNITY): Payer: Self-pay | Admitting: Psychiatry

## 2016-09-19 ENCOUNTER — Ambulatory Visit (INDEPENDENT_AMBULATORY_CARE_PROVIDER_SITE_OTHER): Payer: Medicare Other | Admitting: Psychiatry

## 2016-09-19 VITALS — BP 98/58 | HR 68 | Ht 70.0 in | Wt 262.0 lb

## 2016-09-19 DIAGNOSIS — Z811 Family history of alcohol abuse and dependence: Secondary | ICD-10-CM | POA: Diagnosis not present

## 2016-09-19 DIAGNOSIS — Z818 Family history of other mental and behavioral disorders: Secondary | ICD-10-CM

## 2016-09-19 DIAGNOSIS — Z880 Allergy status to penicillin: Secondary | ICD-10-CM

## 2016-09-19 DIAGNOSIS — Z888 Allergy status to other drugs, medicaments and biological substances status: Secondary | ICD-10-CM | POA: Diagnosis not present

## 2016-09-19 DIAGNOSIS — F251 Schizoaffective disorder, depressive type: Secondary | ICD-10-CM | POA: Diagnosis not present

## 2016-09-19 DIAGNOSIS — Z813 Family history of other psychoactive substance abuse and dependence: Secondary | ICD-10-CM

## 2016-09-19 DIAGNOSIS — Z91018 Allergy to other foods: Secondary | ICD-10-CM

## 2016-09-19 MED ORDER — TRAZODONE HCL 100 MG PO TABS: 100.0000 mg | ORAL_TABLET | Freq: Every day | ORAL | 2 refills | Status: DC

## 2016-09-19 MED ORDER — SERTRALINE HCL 100 MG PO TABS
100.0000 mg | ORAL_TABLET | Freq: Every day | ORAL | 2 refills | Status: DC
Start: 2016-09-19 — End: 2016-12-20

## 2016-09-19 MED ORDER — BENZTROPINE MESYLATE 1 MG PO TABS: 1.0000 mg | ORAL_TABLET | Freq: Every day | ORAL | 2 refills | Status: DC

## 2016-09-19 MED ORDER — PALIPERIDONE ER 9 MG PO TB24: 9.0000 mg | ORAL_TABLET | Freq: Every day | ORAL | 2 refills | Status: DC

## 2016-09-19 MED ORDER — LAMOTRIGINE 100 MG PO TABS: 100.0000 mg | ORAL_TABLET | Freq: Two times a day (BID) | ORAL | 2 refills | Status: DC
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
## 2016-09-20 DIAGNOSIS — G4733 Obstructive sleep apnea (adult) (pediatric): Secondary | ICD-10-CM | POA: Diagnosis not present

## 2016-10-04 ENCOUNTER — Telehealth: Payer: Self-pay | Admitting: *Deleted

## 2016-10-04 DIAGNOSIS — Z1231 Encounter for screening mammogram for malignant neoplasm of breast: Secondary | ICD-10-CM

## 2016-10-04 NOTE — Telephone Encounter (Signed)
Social worker Clinical research associate ) called back. She can do mammo on April 6th, 9th or 12th after 9am. Need to call and schedule and then let [name] know

## 2016-10-04 NOTE — Telephone Encounter (Addendum)
Dr Newell Coral to order CT of lung on patient to follow up from 10/17-lung mass Please check to make sure completed per Dr Nicki Reaper  CT chest without contrast scheduled for 10/19/16 by Dr Servando Snare -see under imaging

## 2016-10-04 NOTE — Telephone Encounter (Signed)
mammo scheduled April 6th register 10:15 aph. [name] notified of appt

## 2016-10-04 NOTE — Telephone Encounter (Addendum)
Left message to return call with social worker to see when she can take patient to appointment for screening mammo(already ordered in [facility])

## 2016-10-08 NOTE — Telephone Encounter (Signed)
Thank you :)

## 2016-10-10 ENCOUNTER — Ambulatory Visit: Payer: Medicare Other | Admitting: Family Medicine

## 2016-10-13 ENCOUNTER — Ambulatory Visit (HOSPITAL_COMMUNITY): Admission: RE | Admit: 2016-10-13 | Payer: Medicare Other | Source: Ambulatory Visit

## 2016-10-13 ENCOUNTER — Other Ambulatory Visit: Payer: Self-pay | Admitting: Family Medicine

## 2016-10-13 NOTE — Telephone Encounter (Signed)
Psych meds are Dr Harrington Challenger- others are ours, our meds may be refilled times 6

## 2016-10-19 ENCOUNTER — Other Ambulatory Visit: Payer: Self-pay

## 2016-10-19 ENCOUNTER — Ambulatory Visit: Payer: Medicare Other

## 2016-10-21 DIAGNOSIS — G4733 Obstructive sleep apnea (adult) (pediatric): Secondary | ICD-10-CM | POA: Diagnosis not present

## 2016-10-28 IMAGING — CT CT CHEST W/O CM
3 of 4 series · 17 of 30 positions shown, 19 images · non-contrast
Comparison: Chest CT 01/27/2014. ; chest CT 01/15/2014 ; chest CT
11/01/2011.

CLINICAL DATA: Follow-up lung nodule. Mid left chest pain and
productive cough. History of right breast cancer.

EXAM:
CT CHEST WITHOUT CONTRAST
TECHNIQUE: Multidetector CT imaging of the chest was performed following the
standard protocol without IV contrast.

[Series 3: chest w/o · axial · non-contrast · 0.70mm/px · z∈[-217,-22]mm · 5 of 59 slices shown, 7 images]
[im 10/59  mediastinal]
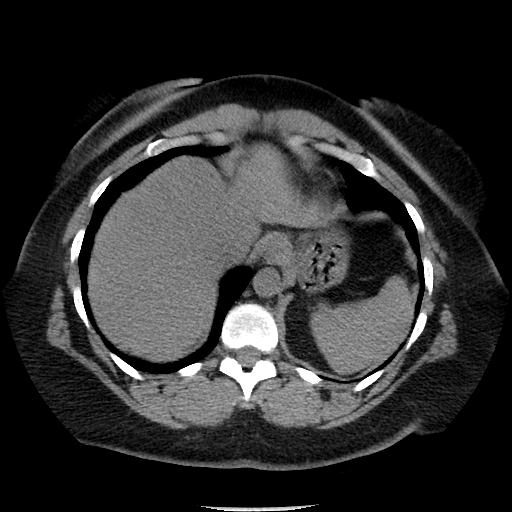
[im 10/59  lung]
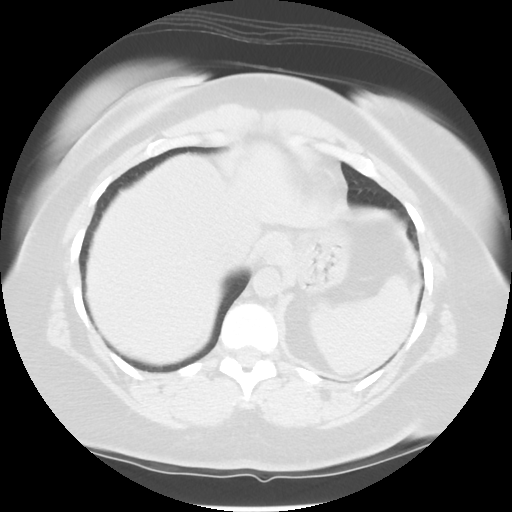
[im 20/59  lung]
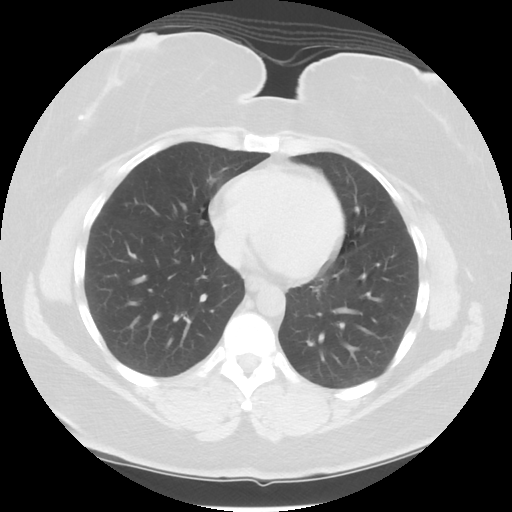
[im 30/59  lung]
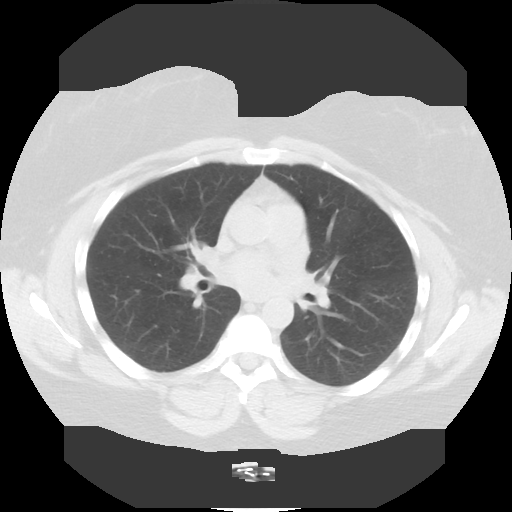
[im 39/59  lung]
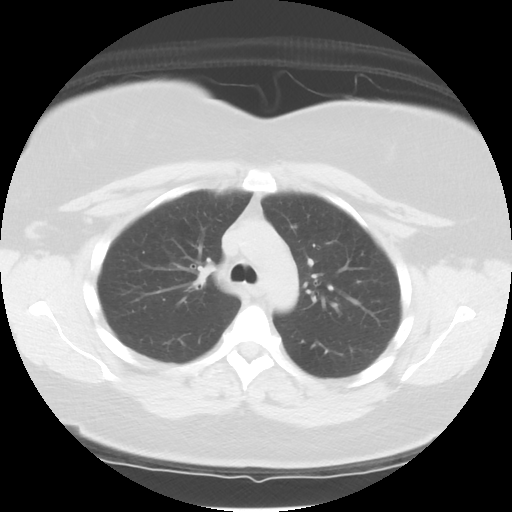
[im 49/59  mediastinal]
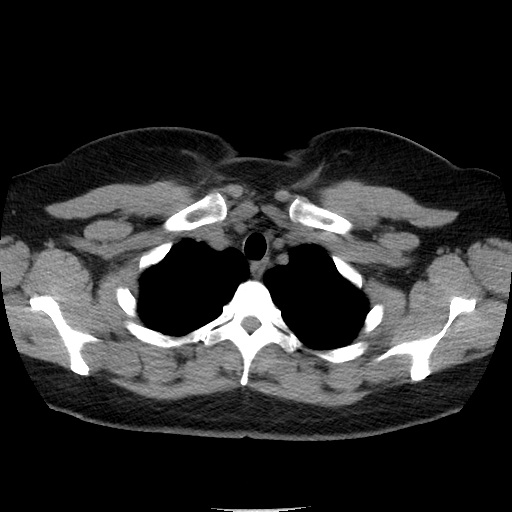
[im 49/59  lung]
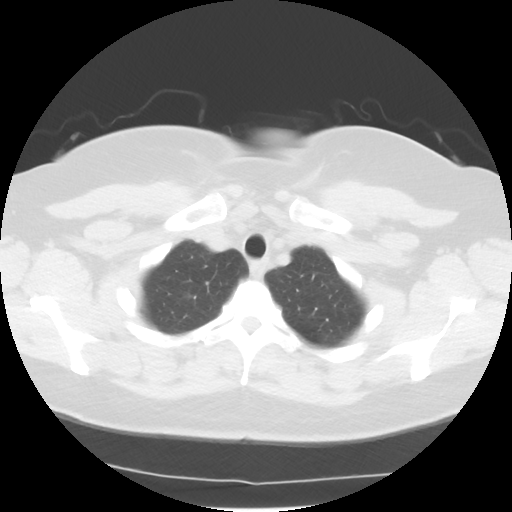

[Series 4: lung windows · axial · 0.70mm/px · z∈[-207,-32]mm · 4 of 59 slices shown]
[im 12/59  lung]
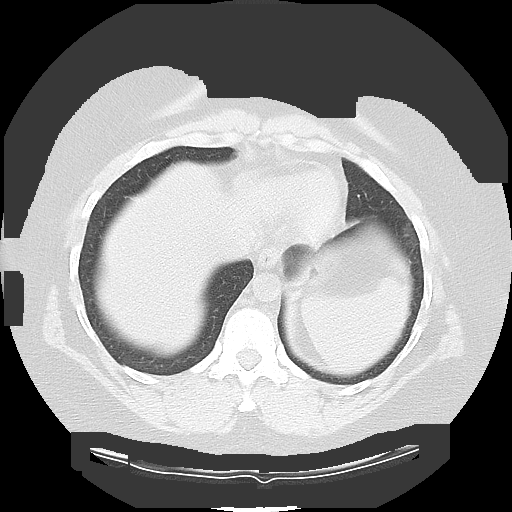
[im 24/59  lung]
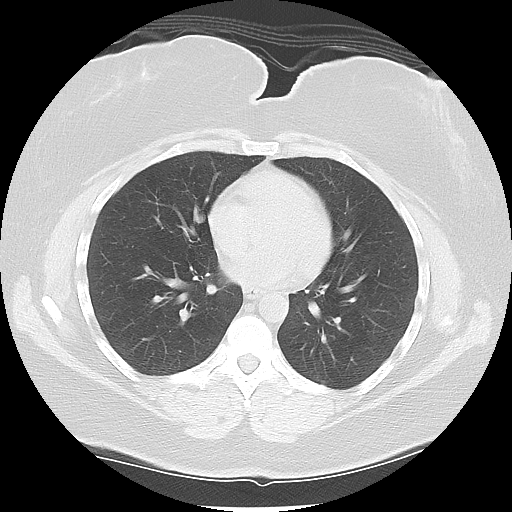
[im 35/59  lung]
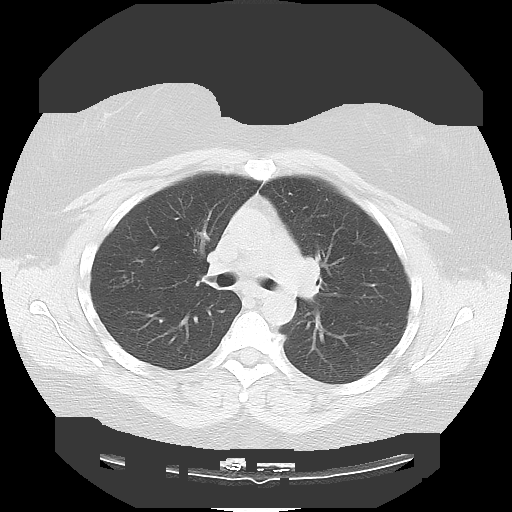
[im 47/59  lung]
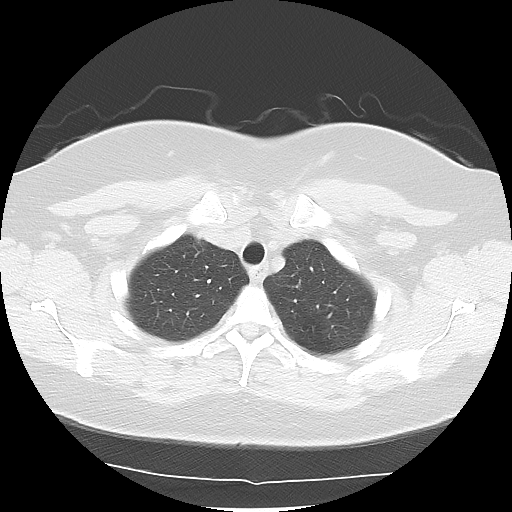

[Series 602: sagittal body · sagittal · 0.70mm/px · 8 of 145 slices shown]
[im 11/145  mediastinal]
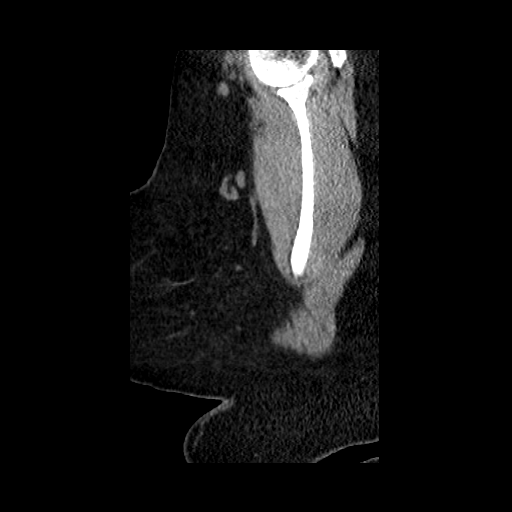
[im 31/145  mediastinal]
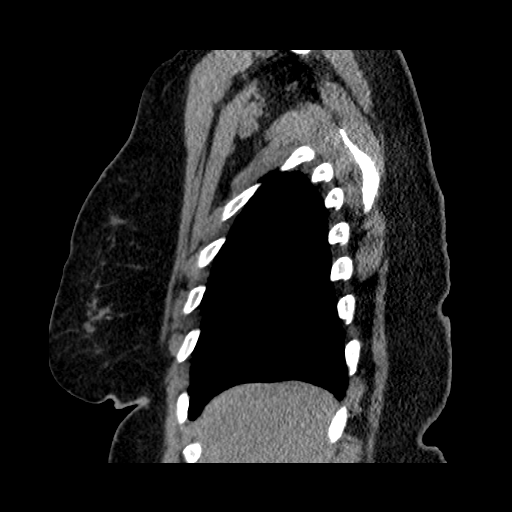
[im 52/145  mediastinal]
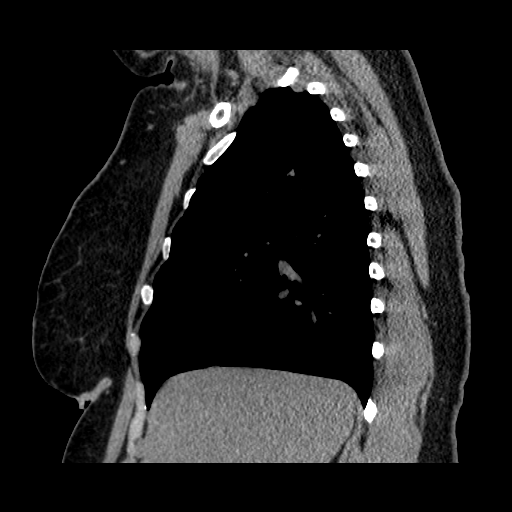
[im 62/145  mediastinal]
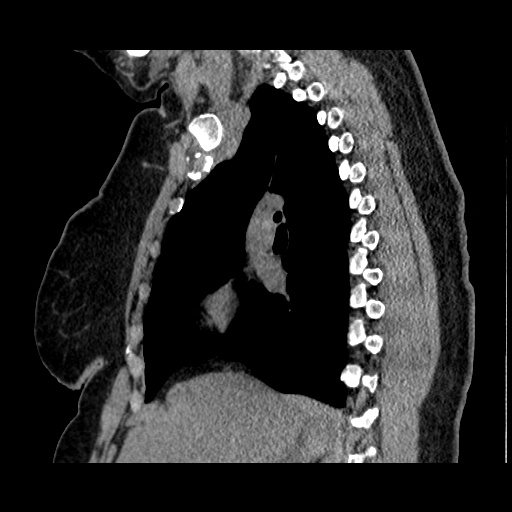
[im 83/145  mediastinal]
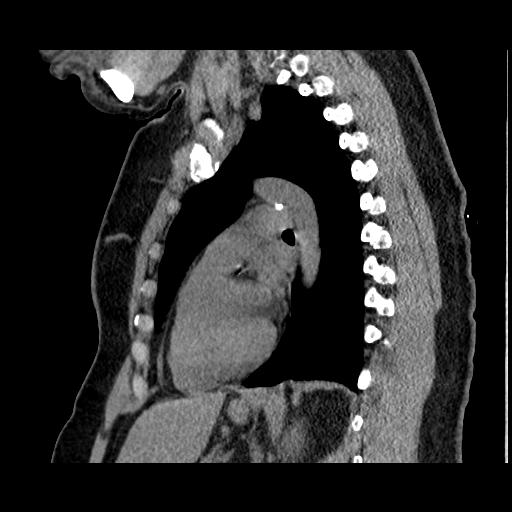
[im 93/145  mediastinal]
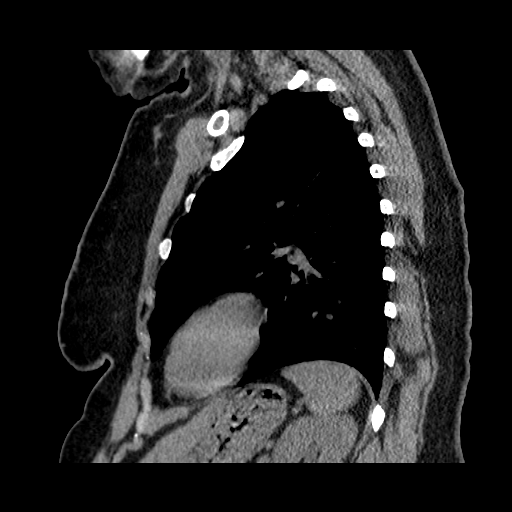
[im 114/145  mediastinal]
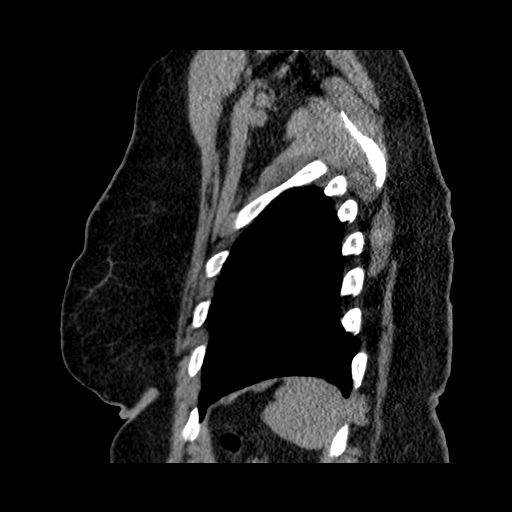
[im 134/145  mediastinal]
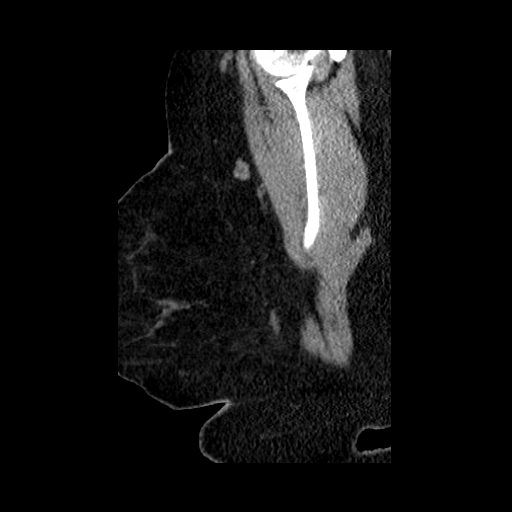

[17 of 30 positions shown; findings below may reference images not displayed]

FINDINGS: Mediastinum/Nodes: Visualized thyroid is unremarkable. No enlarged
axillary, mediastinal or hilar lymphadenopathy. Normal heart size.

Lungs/Pleura: Central airways are patent. No consolidative pulmonary
opacities. There is an 11 x 8 mm well-circumscribed nodule within
the medial segment of the right middle lobe (image 38; series 4).
This is unchanged from prior exam 01/27/2014 and minimally increased
in size from CT 11/01/2011. No other suspicious appearing pulmonary
nodules or masses. No pleural effusion or pneumothorax.

Upper abdomen: Hepatic steatosis.

Musculoskeletal: No aggressive or acute appearing osseous lesions.
IMPRESSION: No significant interval change in size of oval circumscribed nodule
within the medial segment right middle lobe. Given stability over
time, this is favored to represent a benign process. As discussed on
prior CT, an additional followup chest CT in 1-2 years is
recommended to ensure continued stability.

## 2016-10-30 ENCOUNTER — Other Ambulatory Visit: Payer: Self-pay | Admitting: Family Medicine

## 2016-10-30 DIAGNOSIS — N63 Unspecified lump in unspecified breast: Secondary | ICD-10-CM

## 2016-11-08 ENCOUNTER — Ambulatory Visit (HOSPITAL_COMMUNITY)
Admission: RE | Admit: 2016-11-08 | Discharge: 2016-11-08 | Disposition: A | Discharge status: Home / Self Care | Payer: Medicare Other | Source: Ambulatory Visit | Attending: Family Medicine | Admitting: Family Medicine

## 2016-11-08 DIAGNOSIS — N6489 Other specified disorders of breast: Secondary | ICD-10-CM | POA: Diagnosis not present

## 2016-11-08 DIAGNOSIS — R928 Other abnormal and inconclusive findings on diagnostic imaging of breast: Secondary | ICD-10-CM | POA: Diagnosis not present

## 2016-11-08 DIAGNOSIS — N63 Unspecified lump in unspecified breast: Secondary | ICD-10-CM

## 2016-11-13 ENCOUNTER — Other Ambulatory Visit (HOSPITAL_COMMUNITY): Payer: Self-pay | Admitting: Psychiatry

## 2016-11-13 ENCOUNTER — Other Ambulatory Visit: Payer: Self-pay | Admitting: Family Medicine

## 2016-11-13 NOTE — Telephone Encounter (Signed)
Last seen 07/31/16

## 2016-11-14 ENCOUNTER — Encounter (HOSPITAL_COMMUNITY): Payer: Self-pay

## 2016-11-20 DIAGNOSIS — G4733 Obstructive sleep apnea (adult) (pediatric): Secondary | ICD-10-CM | POA: Diagnosis not present

## 2016-11-23 ENCOUNTER — Encounter: Payer: Self-pay | Admitting: Family Medicine

## 2016-11-23 ENCOUNTER — Ambulatory Visit (INDEPENDENT_AMBULATORY_CARE_PROVIDER_SITE_OTHER): Payer: Medicare Other | Admitting: Family Medicine

## 2016-11-23 VITALS — BP 124/72 | Ht 70.5 in | Wt 259.4 lb

## 2016-11-23 DIAGNOSIS — E119 Type 2 diabetes mellitus without complications: Secondary | ICD-10-CM | POA: Diagnosis not present

## 2016-11-23 DIAGNOSIS — I10 Essential (primary) hypertension: Secondary | ICD-10-CM

## 2016-11-23 DIAGNOSIS — Z79899 Other long term (current) drug therapy: Secondary | ICD-10-CM | POA: Diagnosis not present

## 2016-11-23 DIAGNOSIS — F17200 Nicotine dependence, unspecified, uncomplicated: Secondary | ICD-10-CM | POA: Diagnosis not present

## 2016-11-23 DIAGNOSIS — G4733 Obstructive sleep apnea (adult) (pediatric): Secondary | ICD-10-CM | POA: Diagnosis not present

## 2016-11-23 LAB — POCT GLYCOSYLATED HEMOGLOBIN (HGB A1C): Hemoglobin A1C: 6.5

## 2016-11-23 NOTE — Progress Notes (Signed)
   Subjective:    Patient ID: [name], female    DOB: Nov 12, 1960, 56 y.o.   MRN: [id number]  Diabetes  She presents for her follow-up diabetic visit. She has type 2 diabetes mellitus. No MedicAlert identification noted. She has not had a previous visit with a dietitian. She sees a podiatrist.Eye exam is current.    Patient would like a prescription for diabetic shoes.   Results for orders placed or performed in visit on 11/23/16  POCT HgB A1C  Result Value Ref Range   Hemoglobin A1C 6.5   Patient smokes she is been counseled to quit has a pulmonary nodule which is being followed by pulmonary Review of Systems She relates joint pains discomforts also relates feeling fatigued denies high fever chills sweats denies vomiting chest pain denies abdominal pain appetite fair does not do much walking   patient has psychiatric illness followed by psychiatry relates she is taken her medicine States she takes her blood pressure medicine on a regular basis One-A-Day She takes her metformin denies problems with it Takes her acid blocker relates sets under good control  Objective:   Physical Exam Neck no masses lungs clear no crackles heart regular pulse normal skin warm dry   Diabetic foot exam has flat feet bunions pre-ulcerative calluses a mild neuropathy no ulcers    Assessment & Plan:  Hyperlipidemia watch diet check lab work previous labs reviewed Blood pressure good control lisinopril 1 a day Sleep apnea her sleep apnea read-out shows she is doing a good job using it over 4 hours per day 90% the time and is helping her Her diabetes under good control She does qualify for diabetic shoes She was seen as part of a face-to-face evaluation for that She does smoke she is been told to quit smoking

## 2016-11-23 NOTE — Progress Notes (Signed)
   Subjective:    Patient ID: [name], female    DOB: [birth year], 56 y.o.   MRN: [id number]  HPI    Review of Systems     Objective:   Physical Exam        Assessment & Plan:

## 2016-11-23 NOTE — Patient Instructions (Signed)
Steps to Quit Smoking Smoking tobacco can be bad for your health. It can also affect almost every organ in your body. Smoking puts you and people around you at risk for many serious long-lasting (chronic) diseases. Quitting smoking is hard, but it is one of the best things that you can do for your health. It is never too late to quit. What are the benefits of quitting smoking? When you quit smoking, you lower your risk for getting serious diseases and conditions. They can include:  Lung cancer or lung disease.  Heart disease.  Stroke.  Heart attack.  Not being able to have children (infertility).  Weak bones (osteoporosis) and broken bones (fractures). If you have coughing, wheezing, and shortness of breath, those symptoms may get better when you quit. You may also get sick less often. If you are pregnant, quitting smoking can help to lower your chances of having a baby of low birth weight. What can I do to help me quit smoking? Talk with your doctor about what can help you quit smoking. Some things you can do (strategies) include:  Quitting smoking totally, instead of slowly cutting back how much you smoke over a period of time.  Going to in-person counseling. You are more likely to quit if you go to many counseling sessions.  Using resources and support systems, such as:  Online chats with a counselor.  Phone quitlines.  Printed self-help materials.  Support groups or group counseling.  Text messaging programs.  Mobile phone apps or applications.  Taking medicines. Some of these medicines may have nicotine in them. If you are pregnant or breastfeeding, do not take any medicines to quit smoking unless your doctor says it is okay. Talk with your doctor about counseling or other things that can help you. Talk with your doctor about using more than one strategy at the same time, such as taking medicines while you are also going to in-person counseling. This can help make quitting  easier. What things can I do to make it easier to quit? Quitting smoking might feel very hard at first, but there is a lot that you can do to make it easier. Take these steps:  Talk to your family and friends. Ask them to support and encourage you.  Call phone quitlines, reach out to support groups, or work with a counselor.  Ask people who smoke to not smoke around you.  Avoid places that make you want (trigger) to smoke, such as:  Bars.  Parties.  Smoke-break areas at work.  Spend time with people who do not smoke.  Lower the stress in your life. Stress can make you want to smoke. Try these things to help your stress:  Getting regular exercise.  Deep-breathing exercises.  Yoga.  Meditating.  Doing a body scan. To do this, close your eyes, focus on one area of your body at a time from head to toe, and notice which parts of your body are tense. Try to relax the muscles in those areas.  Download or buy apps on your mobile phone or tablet that can help you stick to your quit plan. There are many free apps, such as QuitGuide from the CDC (Centers for Disease Control and Prevention). You can find more support from smokefree.gov and other websites. This information is not intended to replace advice given to you by your health care provider. Make sure you discuss any questions you have with your health care provider. Document Released: 04/22/2009 Document Revised: 02/22/2016 Document   Reviewed: 11/10/2014 Elsevier Interactive Patient Education  2017 Elsevier Inc.  

## 2016-11-24 NOTE — Telephone Encounter (Signed)
I would recommend that this patient not be on this medication

## 2016-12-12 ENCOUNTER — Other Ambulatory Visit: Payer: Self-pay | Admitting: Family Medicine

## 2016-12-12 ENCOUNTER — Other Ambulatory Visit: Payer: Self-pay | Admitting: Gastroenterology

## 2016-12-12 NOTE — Telephone Encounter (Signed)
Ok six mo all

## 2016-12-18 DIAGNOSIS — G4733 Obstructive sleep apnea (adult) (pediatric): Secondary | ICD-10-CM | POA: Diagnosis not present

## 2016-12-20 ENCOUNTER — Encounter (HOSPITAL_COMMUNITY): Payer: Self-pay | Admitting: Psychiatry

## 2016-12-20 ENCOUNTER — Ambulatory Visit (INDEPENDENT_AMBULATORY_CARE_PROVIDER_SITE_OTHER): Payer: Medicare Other | Admitting: Psychiatry

## 2016-12-20 VITALS — BP 120/82 | HR 79 | Wt 260.8 lb

## 2016-12-20 DIAGNOSIS — Z888 Allergy status to other drugs, medicaments and biological substances status: Secondary | ICD-10-CM

## 2016-12-20 DIAGNOSIS — Z811 Family history of alcohol abuse and dependence: Secondary | ICD-10-CM | POA: Diagnosis not present

## 2016-12-20 DIAGNOSIS — Z7984 Long term (current) use of oral hypoglycemic drugs: Secondary | ICD-10-CM | POA: Diagnosis not present

## 2016-12-20 DIAGNOSIS — F259 Schizoaffective disorder, unspecified: Secondary | ICD-10-CM

## 2016-12-20 DIAGNOSIS — F17210 Nicotine dependence, cigarettes, uncomplicated: Secondary | ICD-10-CM | POA: Diagnosis not present

## 2016-12-20 DIAGNOSIS — Z886 Allergy status to analgesic agent status: Secondary | ICD-10-CM | POA: Diagnosis not present

## 2016-12-20 DIAGNOSIS — Z880 Allergy status to penicillin: Secondary | ICD-10-CM | POA: Diagnosis not present

## 2016-12-20 DIAGNOSIS — F79 Unspecified intellectual disabilities: Secondary | ICD-10-CM

## 2016-12-20 DIAGNOSIS — Z79899 Other long term (current) drug therapy: Secondary | ICD-10-CM

## 2016-12-20 DIAGNOSIS — Z814 Family history of other substance abuse and dependence: Secondary | ICD-10-CM

## 2016-12-20 DIAGNOSIS — F251 Schizoaffective disorder, depressive type: Secondary | ICD-10-CM

## 2016-12-20 MED ORDER — PALIPERIDONE ER 9 MG PO TB24: 9.0000 mg | ORAL_TABLET | Freq: Every day | ORAL | 2 refills | Status: DC

## 2016-12-20 MED ORDER — SERTRALINE HCL 100 MG PO TABS: 100.0000 mg | ORAL_TABLET | Freq: Every day | ORAL | 2 refills | Status: DC

## 2016-12-20 MED ORDER — LAMOTRIGINE 100 MG PO TABS: 100.0000 mg | ORAL_TABLET | Freq: Two times a day (BID) | ORAL | 2 refills | Status: DC

## 2016-12-20 MED ORDER — BENZTROPINE MESYLATE 1 MG PO TABS: 1.0000 mg | ORAL_TABLET | Freq: Every day | ORAL | 2 refills | Status: DC

## 2016-12-20 MED ORDER — TRAZODONE HCL 100 MG PO TABS: 100.0000 mg | ORAL_TABLET | Freq: Every day | ORAL | 2 refills | Status: DC
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
## 2016-12-21 ENCOUNTER — Ambulatory Visit (INDEPENDENT_AMBULATORY_CARE_PROVIDER_SITE_OTHER): Payer: Medicare Other | Admitting: Family Medicine

## 2016-12-21 ENCOUNTER — Encounter: Payer: Self-pay | Admitting: Family Medicine

## 2016-12-21 ENCOUNTER — Encounter: Payer: Self-pay | Admitting: Cardiothoracic Surgery

## 2016-12-21 ENCOUNTER — Ambulatory Visit (INDEPENDENT_AMBULATORY_CARE_PROVIDER_SITE_OTHER): Payer: Medicare Other | Admitting: Cardiothoracic Surgery

## 2016-12-21 ENCOUNTER — Ambulatory Visit
Admission: RE | Admit: 2016-12-21 | Discharge: 2016-12-21 | Disposition: A | Discharge status: Home / Self Care | Payer: Medicare Other | Source: Ambulatory Visit | Attending: Cardiothoracic Surgery | Admitting: Cardiothoracic Surgery

## 2016-12-21 VITALS — BP 126/68 | Temp 98.2°F | Ht 70.5 in | Wt 263.4 lb

## 2016-12-21 VITALS — BP 109/74 | HR 73 | Resp 20 | Ht 70.0 in | Wt 259.0 lb

## 2016-12-21 DIAGNOSIS — L03818 Cellulitis of other sites: Secondary | ICD-10-CM | POA: Diagnosis not present

## 2016-12-21 DIAGNOSIS — G4733 Obstructive sleep apnea (adult) (pediatric): Secondary | ICD-10-CM | POA: Diagnosis not present

## 2016-12-21 DIAGNOSIS — R911 Solitary pulmonary nodule: Secondary | ICD-10-CM | POA: Diagnosis not present

## 2016-12-21 MED ORDER — MUPIROCIN 2 % EX OINT: TOPICAL_OINTMENT | CUTANEOUS | 0 refills | Status: AC

## 2016-12-21 MED ORDER — DOXYCYCLINE HYCLATE 100 MG PO TABS: 100.0000 mg | ORAL_TABLET | Freq: Two times a day (BID) | ORAL | 0 refills | Status: DC

## 2016-12-21 NOTE — Progress Notes (Deleted)
n

## 2016-12-21 NOTE — Progress Notes (Signed)
[address]       [place] [address]             [phone]       [name] Record #[id number] Date of Birth: [birth year]  Referring: Kathyrn Drown, MD Primary Care: Kathyrn Drown, MD  Chief Complaint:    Lung Nodule   History of Present Illness:  Patient comes in today with a follow-up CT scan. She is a long-term smoker since age 56 and to a significant degree since age 17, she notes that she smokes a cigarette every 4 hours. She was first noted in July 2013 to have a small incidental right middle lobe lung nodule, she has had serial CT scans to follow this nodule. The location makes it difficult to biopsy.    She's had no history of tuberculosis or known tuberculosis exposure.  She does give a history of having a myocardial infarction in being hospitalized for 5 days at age 25 no other details about this revealed. She does note that at times especially with exertion she feels a pounding heart and chest discomfort with exertional shortness of breath. She denies resting shortness of breath.  She does give a history of having a right-sided stroke in the past.     Current Activity/ Functional Status: Patient  is independent with mobility/ambulation, transfers, ADL's, IADL's. T     Past Medical History  Diagnosis Date  . Mental retardation   . Hyperglycemia   . HTN (hypertension)   . Migraines   . Anxiety   . IBS (irritable bowel syndrome)   . Diabetes mellitus   . Lung nodules     right, followed by PCP, PET 11/2011    Past Surgical History:  Procedure Laterality Date  . ABDOMINAL HYSTERECTOMY    . COLONOSCOPY  11/2007   hyperplastic polyps, prior hx of adenomas   . COLONOSCOPY  05/2010   incomplete due to poor prep, hyperplastic rectal polyp  . COLONOSCOPY  05/05/2002   Dimunitive polyps in the rectum and left colon, cold    biopsied/removed.  Scattered few left-sided diverticula.  Regular colonic   mucosa  appeared normal  . COLONOSCOPY N/A 05/28/2013   Rourk: mulitple tubular adenomas removed. next tcs 05/2016  . COLONOSCOPY WITH PROPOFOL N/A 09/07/2016   Procedure: COLONOSCOPY WITH PROPOFOL;  Surgeon: Daneil Dolin, MD;  [facility];  Service: Endoscopy;  Laterality: N/A;  9:30am  . ESOPHAGOGASTRODUODENOSCOPY  08/2007   moderate sized hiatal hernia  . ESOPHAGOGASTRODUODENOSCOPY  05/2010   noncritical schatzki ring s/p 90F  . ESOPHAGOGASTRODUODENOSCOPY (EGD) WITH ESOPHAGEAL DILATION N/A 02/06/2013   BWL:SLHTDS esophagus-s/p dilation up to a 1 [place] size with [facility] dilators.  Hiatal hernia  . ESOPHAGOGASTRODUODENOSCOPY (EGD) WITH PROPOFOL N/A 09/07/2016   Procedure: ESOPHAGOGASTRODUODENOSCOPY (EGD) WITH PROPOFOL;  Surgeon: Daneil Dolin, MD;  [facility]: [facility];  Service: Endoscopy;  Laterality: N/A;  . EXTERNAL EAR SURGERY     bilateral  . FOOT SURGERY    . GLAUCOMA SURGERY    . MALONEY DILATION N/A 09/07/2016   Procedure: Venia Minks DILATION;  Surgeon: Daneil Dolin, MD;  Location: [facility];  Service: Endoscopy;  Laterality: N/A;  . POLYPECTOMY  09/07/2016   Procedure: POLYPECTOMY;  Surgeon: Daneil Dolin, MD;  Location: [facility];  Service: Endoscopy;;  sigmoid colon x4  . small bowel capsule  10/2007   normal    Family History  Problem Relation Age of Onset  . Stroke Mother   . Heart attack Father   . Schizophrenia Other   . Drug abuse Other   . Alcohol abuse Other   . Colon cancer Unknown        aunt  . Obesity Unknown   . COPD Unknown   . GER disease Unknown   . Diabetes type II Unknown   . Anxiety disorder Unknown   . Depression Unknown   . Depression Sister   . Schizophrenia Sister   . Liver disease Neg Hx   . Inflammatory bowel disease Neg Hx       History  Smoking Status  . Current Every Day Smoker  . Packs/day: 0.25  . Years: 40.00  . Types: Cigarettes  Smokeless Tobacco  . Never Used    History  Alcohol Use No   Says does not  drink alcohol      Allergies  Allergen Reactions  . Thorazine [Chlorpromazine Hcl] Anaphylaxis  . Acetaminophen Other (See Comments)    Makes pt dizzy  . Aspirin Other (See Comments)    seizure  . Aspirin-Acetaminophen-Caffeine Other (See Comments)    seizure  . Nsaids Nausea And Vomiting  . Penicillins Nausea And Vomiting    Has patient had a PCN reaction causing immediate rash, facial/tongue/throat swelling, SOB or lightheadedness with hypotension:Yes Has patient had a PCN reaction causing severe rash involving mucus membranes or skin necrosis:Yes Has patient had a PCN reaction that required hospitalization:Yes Has patient had a PCN reaction occurring within the last 10 years:No If all of the above answers are "NO", then may proceed with Cephalosporin use.   . Tomato Rash    Current Outpatient Prescriptions  Medication Sig Dispense Refill  . benztropine (COGENTIN) 1 MG tablet Take 1 tablet (1 mg total) by mouth daily. 30 tablet 2  . calcium-vitamin D (OSCAL WITH D) 500-200 MG-UNIT per tablet Take 1 tablet by mouth 3 (three) times daily.      . diclofenac (VOLTAREN) 75 MG EC tablet TAKE 1 TABLET BY MOUTH TWICE A DAY. 60 tablet 5  . dicyclomine (BENTYL) 10 MG capsule TAKE 1 CAPSULE BY MOUTH FOUR TIMES DAILY BEFORE MEALS AND AT BEDTIME FOR LOOSE STOOLS.HOLD FOR CONSTIPATION 120 capsule 3  . lamoTRIgine (LAMICTAL) 100 MG tablet Take 1 tablet (100 mg total) by mouth 2 (two) times daily. 60 tablet 2  . lisinopril (PRINIVIL,ZESTRIL) 5 MG tablet TAKE 1 TABLET BY MOUTH ONCE A DAY. 30 tablet 5  . metFORMIN (GLUCOPHAGE) 500 MG tablet TAKE (1/2) TABLET BY MOUTH 2 TIMES A DAY. 15 tablet 0  . ondansetron (ZOFRAN ODT) 4 MG disintegrating tablet Take 1 tablet (4 mg total) by mouth every 8 (eight) hours as needed for nausea or vomiting. 20 tablet 0  . paliperidone (INVEGA) 9 MG 24 hr tablet Take 1 tablet (9 mg total) by mouth daily. 30 tablet 2  . pantoprazole (PROTONIX) 40 MG tablet Take 1  tablet (40 mg total) by mouth daily before breakfast. 30 tablet 3  . polyethylene glycol (MIRALAX / GLYCOLAX) packet Take 17 g by mouth daily as needed for mild constipation. Reported on 01/27/2016 30 each 3  . polyethylene glycol-electrolytes (TRILYTE) 420 g solution Take 4,000 mLs by mouth as directed. 4000 mL 0  . potassium chloride (K-DUR) 10 MEQ tablet TAKE 1 TABLET BY MOUTH TWICE A DAY. 60 tablet 5  . propranolol (INDERAL) 10 MG tablet TAKE 1 TABLET BY MOUTH THREE TIMES A DAY. [place]  tablet 5  . sertraline (ZOLOFT) 100 MG tablet Take 1 tablet (100 mg total) by mouth daily. 30 tablet 2  . torsemide (DEMADEX) 20 MG tablet TAKE 2 TABLETS BY MOUTH EACH MORNING. 60 tablet 5  . traZODone (DESYREL) 100 MG tablet Take 1 tablet (100 mg total) by mouth at bedtime. 30 tablet 2   No current facility-administered medications for this visit.        Review of Systems:     Cardiac Review of Systems: Y or N  Chest Pain [ y ]  Resting SOB [ n  ] Exertional SOB  Blue.Reese ]  Vertell Limber Florencio.Farrier ]   Pedal Edema Blue.Reese   ]    Palpitations Blue.Reese  ] Syncope  Blue.Reese  ]   Presyncope [ n  ]  General Review of Systems: [Y] = yes [  ]=no Constitional: recent weight change [ y ]; anorexia [  ]; fatigue [  ]; nausea [  ]; night sweats [  ]; fever [  ]; or chills [  ];                                                                                                                                          Dental: poor dentition[y  ];  Eye : blurred vision [  ]; diplopia [   ]; vision changes [  ];  Amaurosis fugax[  ]; Resp: cough [  ];  wheezing[  ];  hemoptysis[  ]; shortness of breath[  ]; paroxysmal nocturnal dyspnea[  ]; dyspnea on exertion[  ]; or orthopnea[  ];  GI:  gallstones[  ], vomiting[  ];  dysphagia[  ]; melena[  ];  hematochezia [  ]; heartburn[  ];   Hx of  Colonoscopy[  ]; GU: kidney stones [  ]; hematuria[  ];   dysuria [  ];  nocturia[  ];  history of     obstruction [  ];             Skin: rash, swelling[  ];, hair loss[  ];   peripheral edema[  ];  or itching[  ]; Musculosketetal: myalgias[  ];  joint swelling[  ];  joint erythema[  ];  joint pain[  ];  back pain[  ];  Heme/Lymph: bruising[  ];  bleeding[  ];  anemia[  ];  Neuro: TIA[  ];  headaches[  ];  stroke[y  ];  vertigo[y  ];  seizures[y  ];   paresthesias[ y ];  difficulty walking[ y ];  Psych:depression[ y ]; anxiety[ y ];  Endocrine: diabetes[  ];  thyroid dysfunction[n  ];  Immunizations: Flu [ ? ]; Pneumococcal[?  ];  The patient has a very positive review of systems clean weight gain chest pain chest tightness shortness of breath difficulty lying flat palpitations shoulder pain blindness in one eye at times changes in  hearing.,   Physical Exam: .BP 109/74   Pulse 73   Resp 20   Ht 5\' 10"  (1.778 m)   Wt 259 lb (117.5 kg)   SpO2 97% Comment: RA  BMI 37.16 kg/m    General appearance: alert, cooperative, appears older than stated age and no distress Neurologic: intact Heart: regular rate and rhythm, S1, S2 normal, no murmur, click, rub or gallop and normal apical impulse Lungs: clear to auscultation bilaterally and normal percussion bilaterally Abdomen: soft, non-tender; bowel sounds normal; no masses,  no organomegaly Extremities: edema mild bil pedal edema and Homans sign is negative, no sign of DVT Wound:  no wounds  Patient has no carotid bruits no cervical supraclavicular adenopathy is appreciated, especially in the area noted on that scan in the right axilla   Diagnostic Studies & Laboratory data:     Recent Radiology Findings:  Ct Chest Wo Contrast  Result Date: 12/21/2016 CLINICAL DATA:  Followup of right-sided pulmonary nodule. EXAM: CT CHEST WITHOUT CONTRAST TECHNIQUE: Multidetector CT imaging of the chest was performed following the standard protocol without IV contrast. COMPARISON:  04/20/2016 FINDINGS: Cardiovascular: Aortic and branch vessel atherosclerosis. Borderline cardiomegaly, without pericardial effusion. Lad coronary  artery atherosclerosis. Mediastinum/Nodes: Small bilateral axillary nodes are unchanged and not pathologic by size criteria. No mediastinal or definite hilar adenopathy, given limitations of unenhanced CT. Lungs/Pleura: No pleural fluid. Mild motion degradation inferiorly. Right middle lobe pulmonary nodule is in the region of motion degradation. 1.8 x 1.0 cm on image 62/series 4. Compare 1.6 x 1.0 cm on the prior exam. 2.0 cm craniocaudal on image 61 coronal, similar. No new pulmonary nodule identified. Upper Abdomen: Moderate hepatic steatosis with sparing in the posterior aspect of segment 4. Normal imaged portions of the spleen, stomach, pancreas, adrenal glands, kidneys. Musculoskeletal: No acute osseous abnormality. IMPRESSION: 1. Mild motion degradation, including through the nodule of concern in the right middle lobe. 2. Given this limitation, the right middle lobe nodules is felt to be similar over 6 months. Consider repeat exam at 3-6 months, given today's limitations. 3. Age advanced coronary artery atherosclerosis. Recommend assessment of coronary risk factors and consideration of medical therapy. 4.  Aortic Atherosclerosis (ICD10-I70.0). 5. Hepatic steatosis. Electronically Signed   By: Abigail Miyamoto M.D.   On: 12/21/2016 11:41    I have independently reviewed the above radiology studies  and reviewed the findings with the patient.  Ct Chest Wo Contrast  Result Date: 04/20/2016 CLINICAL DATA:  Followup pulmonary nodule EXAM: CT CHEST WITHOUT CONTRAST TECHNIQUE: Multidetector CT imaging of the chest was performed following the standard protocol without IV contrast. COMPARISON:  02/11/2015 FINDINGS: Cardiovascular: Aortic atherosclerosis. Calcification in the LAD coronary artery noted. Normal heart size. No pericardial effusion. Mediastinum/Nodes: No enlarged mediastinal or axillary lymph nodes. Thyroid gland, trachea, and esophagus demonstrate no significant findings. Lungs/Pleura: No pleural  effusion. Mild changes of emphysema. Diffuse bronchial wall thickening noted. The nodule of concern within the right middle lobe measures 1.6 by 1.0 cm, image 69 of series 4. Mean diameter is equal to 1.3 cm. On the study from 2014 this had a mean diameter of 9 mm. No new pulmonary nodules identified. Upper Abdomen: No acute abnormality.The adrenal glands appear normal. Mild hepatic steatosis. Musculoskeletal: No chest wall mass or suspicious bone lesions identified. IMPRESSION: 1. The nodule within the right middle lobe has a mean diameter 13 mm on today's study. This is compared with 9 mm in 2014. Although this has a nonaggressive appearance and is  somewhat circumscribed given the interval growth a PET-CT is advised. 2. Aortic atherosclerosis.  LAD coronary artery calcification noted. Electronically Signed   By: Kerby Moors M.D.   On: 04/20/2016 11:33   Ct Chest Wo Contrast  02/11/2015   CLINICAL DATA:  Follow-up lung nodule. Mid left chest pain and productive cough. History of right breast cancer.  EXAM: CT CHEST WITHOUT CONTRAST  TECHNIQUE: Multidetector CT imaging of the chest was performed following the standard protocol without IV contrast.  COMPARISON:  Chest CT 01/27/2014. ; chest CT 01/15/2014 ; chest CT 11/01/2011.  FINDINGS: Mediastinum/Nodes: Visualized thyroid is unremarkable. No enlarged axillary, mediastinal or hilar lymphadenopathy. Normal heart size.  Lungs/Pleura: Central airways are patent. No consolidative pulmonary opacities. There is an 11 x 8 mm well-circumscribed nodule within the medial segment of the right middle lobe (image 38; series 4). This is unchanged from prior exam 01/27/2014 and minimally increased in size from CT 11/01/2011. No other suspicious appearing pulmonary nodules or masses. No pleural effusion or pneumothorax.  Upper abdomen: Hepatic steatosis.  Musculoskeletal: No aggressive or acute appearing osseous lesions.  IMPRESSION: No significant interval change in size of  oval circumscribed nodule within the medial segment right middle lobe. Given stability over time, this is favored to represent a benign process. As discussed on prior CT, an additional followup chest CT in 1-2 years is recommended to ensure continued stability.   Electronically Signed   By: Lovey Newcomer M.D.   On: 02/11/2015 11:10   Ct Chest Wo Contrast  01/15/2014   CLINICAL DATA:  Shortness of breath. Cough. Hemoptysis. History of smoking. Followup evaluation of lung nodule.  EXAM: CT CHEST WITHOUT CONTRAST  TECHNIQUE: Multidetector CT imaging of the chest was performed following the standard protocol without IV contrast.  COMPARISON:  Chest CT 01/02/2013.  FINDINGS: Mediastinum: Heart size is normal. There is no significant pericardial fluid, thickening or pericardial calcification. There is atherosclerosis of the thoracic aorta, the great vessels of the mediastinum and the coronary arteries, including calcified atherosclerotic plaque in the left anterior descending coronary artery. No pathologically enlarged mediastinal or hilar lymph nodes. Please note that accurate exclusion of hilar adenopathy is limited on noncontrast CT scans. Esophagus is unremarkable in appearance.  Lungs/Pleura: 12 x 7 mm well-circumscribed nodule in the mid medial segment of the right middle lobe (image 38 of series 4) is only slightly larger than remote prior study 11/01/2011 at which point it measured 11 x 7 mm when measured in a similar fashion on image 34 of series 3 of that examination. No other suspicious appearing pulmonary nodules or masses. No acute consolidative airspace disease. No pleural effusions.  Upper Abdomen: Unremarkable.  Musculoskeletal: There are no aggressive appearing lytic or blastic lesions noted in the visualized portions of the skeleton.  IMPRESSION: 1. 12 x 7 mm well-circumscribed nodule in the mid medial segment of the right middle lobe (image 38 of series 4) is only slightly larger than remote prior study  11/01/2011 at which point it measured 11 x 7 mm when measured in a similar fashion on image 34 of series 3 of that examination. Given the relative stability over this 2 year time interval, this is strongly favored to represent a benign lesion, potentially a hammartoma (some hammartomas contain brown fat, which may account for low-level metabolic activity on the prior PET-CT). Another differential consideration would be in a low-grade carcinoid tumor. An additional noncontrast chest CT in 1-2 years is suggested to ensure continued stability.  Electronically Signed   By: Vinnie Langton M.D.   On: 01/15/2014 13:24  Ct Chest Wo Contrast  01/02/2013   *RADIOLOGY REPORT*  Clinical Data: Right lung nodule.  History of breast cancer.  CT CHEST WITHOUT CONTRAST  Technique:  Multidetector CT imaging of the chest was performed following the standard protocol without IV contrast.  Comparison: Chest CT 04/22/2012.  Findings: The right middle lobe pulmonary nodule is unchanged. Measures 10.7 x 6.9 mm and previous measure 10.3 x 7.4 mm.  No new nodules are identified.  No acute pulmonary findings.  No pleural effusion.  The heart is normal in size.  No pericardial effusion.  No mediastinal or hilar lymphadenopathy.  Small scattered lymph nodes are stable.  The aorta is normal in caliber.  The esophagus is grossly normal.  The chest wall is unremarkable.  Stable calcifications of the right breast.  No supraclavicular or axillary adenopathy.  Stable bilateral axillary lymph nodes.  The thyroid gland is normal.  The bony thorax is intact.  IMPRESSION:  Stable right middle lobe pulmonary nodule.  Recommend follow-up noncontrast chest CT in 1 year to document stability.   Original Report Authenticated By: Marijo Sanes, M.D.     Ct Chest W Contrast  04/22/2012  *RADIOLOGY REPORT*  Clinical Data: Follow-up pulmonary nodule.  CT CHEST WITH CONTRAST  Technique:  Multidetector CT imaging of the chest was performed following the  standard protocol during bolus administration of intravenous contrast.  Contrast: 54mL OMNIPAQUE IOHEXOL 300 MG/ML  SOLN  Comparison: PET 11/29/2011 and CT chest 11/01/2011.  Findings: No pathologically enlarged mediastinal, hilar or axillary lymph nodes.  Heart size normal.  No pericardial effusion.  A nodule in the right middle lobe measures 10 x 7 mm, stable from 11/01/2011 when remeasured.  A tiny subpleural nodular density in the right lower lobe measures 3 mm (image 42), stable.  No pleural fluid.  Airway is unremarkable.  Incidental imaging of the upper abdomen shows low attenuation throughout the visualized portion of the liver.  No worrisome lytic or sclerotic lesions.  IMPRESSION: Right middle lobe nodule is unchanged from 11/01/2011.  Given mild hypermetabolism on 11/29/2011, a follow-up interval of 6 months is recommended to confirm continued stability.   Original Report Authenticated By: Luretha Rued, M.D.      11/09/2011 RADIOLOGY REPORT*  Clinical Data: Right basilar pulmonary nodule on abdominal CT.  Dysphagia. The patient has diabetes mellitus and unspecified  mental retardation.  CT CHEST WITH CONTRAST  Technique: Multidetector CT imaging of the chest was performed  following the standard protocol during bolus administration of  intravenous contrast.  Contrast: 48mL OMNIPAQUE IOHEXOL 300 MG/ML SOLN  Comparison: Abdominal CTs 10/10/2011 and 01/19/2005. Chest  radiographs 10/23/2001.  Findings: As demonstrated on the recent abdominal CT, there is a  lobulated well circumscribed 10 x 9 mm right middle lobe nodule on  image 35. This is noncalcified. This area of the lung bases was  not imaged on the 2006 CT. No other pulmonary nodules are  identified. There is no airspace disease or confluent airspace  opacity.  There are no enlarged mediastinal or hilar lymph nodes. There is  no pleural or pericardial effusion. Minimal aortic atherosclerosis  is noted.  Images through the  upper abdomen demonstrate diffuse hepatic  steatosis. There is no adrenal mass.  IMPRESSION:  1. Lobulated noncalcified 1-cm right middle lobe nodule was not  imaged on prior abdominal CT. This nodule is not conclusively  demonstrated on radiographs such  that stability cannot be  addressed.  2. This nodule is nonspecific and could reflect a hamartoma or  other benign lesion. However, malignancy cannot be excluded.  3. No other nodules, adenopathy or acute findings.  This nodule would be difficult to biopsy given its close proximity  to the heart. Management options include PET CT to assess for  hypermetabolic activity and follow-up CT (depending on risk factors  for malignancy). If follow-up is elected, follow-up chest CT at 3  months is recommended. This recommendation is adapted from the  consensus statement: Guidelines for Management of Small Pulmonary  Nodules Detected on CT Scans: A Statement from the [place] as published in Radiology 2005; 237:395-400.  Original Report Authenticated By: Vivia Ewing, M.D.  Halina Andreas, MD Tue Jan 09, 2012 2:52:39 PM EDT       **ADDENDUM** CREATED: 01/09/2012 14:43:25  Voice recognition errors:  Within the Findings section, the second sentence of the CHEST  section should read: "NO additional hypermetabolic nodules are  present."  Within the first IMPRESSION, the last phrase of the first sentence  should read: "for a nodule this SIZE."  Findings discussed with Dr. Servando Snare on 07/ 08/2011  **END ADDENDUM** SIGNED BY: Suzy Bouchard, M.D.      Study Result     *RADIOLOGY REPORT*  Clinical Data: Recent imaging demonstrates a solitary pulmonary  nodule. FDG PET CT requested to evaluate for possible malignancy.  NUCLEAR MEDICINE PET SKULL BASE TO THIGH  Fasting Blood Glucose: 113  Technique: 18.4 mCi F-18 FDG was injected intravenously via the  right antecubital fossa. CT data was obtained and used for  attenuation  correction and anatomic localization only. (This was  not acquired as a diagnostic CT examination.) Additional exam  technical data entered on technologist worksheet.  Comparison: CT 11/01/2011  Findings:  Head/Neck: No hypermetabolic lymph nodes in the neck.  Chest: Within the right middle lobe there is a 8 mm pulmonary  nodule adjacent to the right atrium with mild metabolic activity (  SUV max = 2.5). Additional hypermetabolic nodules are present. No  hypermetabolic mediastinal lymph nodes.  There is a single hypermetabolic right axillary lymph node which is  normal pathology. There is misregistration in the right axilla due  to patient motion.  Abdomen/Pelvis: No abnormal hypermetabolic activity within the  liver, pancreas, adrenal glands, or spleen. No hypermetabolic  lymph nodes in the abdomen or pelvis.  Skeleton: No focal hypermetabolic activity to suggest skeletal  metastasis. Sclerosis of the left SI joint appears benign.  IMPRESSION:  1. Mildly hypermetabolic nodule within the right middle lobe.  Although the activity is mild, for a nodule this side this is  concerning although nonspecific. If biopsy is not feasible due to  location, recommend short-term follow-up CT with contrast in 1 to  3 months.  2. No evidence of metastasis.  3. Hypermetabolic right axial lymph node is likely reactive.  Original Report Authenticated By: Suzy Bouchard, M.D.       Recent Lab Findings: Lab Results  Component Value Date   WBC 5.9 09/04/2016   HGB 12.8 09/04/2016   HCT 37.8 09/04/2016   PLT 222 09/04/2016   GLUCOSE 77 09/04/2016   CHOL 156 02/04/2016   TRIG 95 02/04/2016   HDL 61 02/04/2016   LDLCALC 76 02/04/2016   ALT 21 02/04/2016   AST 25 02/04/2016   NA 136 09/04/2016   K 3.9 09/04/2016   CL 98 (L) 09/04/2016   CREATININE 0.94  09/04/2016   BUN 9 09/04/2016   CO2 29 09/04/2016   HGBA1C 6.5 11/23/2016      Assessment / Plan:   Right middle lobe lung  nodule-Likely benign, possible hamartoma. Given the relative stability since 2013 stable to be a benign lesion. Since first noted in 2013 has grown possibly 1 mm per year. With the patient's ongoing smoking, poor surgical candidate we'll continue to follow, will obtain a CT of the chest in 6 months I have again reviewed with her the need to stop smoking    Grace Isaac MD  Beeper [phone] Office [phone] 12/21/2016 12:11 PM

## 2016-12-21 NOTE — Progress Notes (Signed)
   Subjective:    Patient ID: [name], female    DOB: December 16, 1960, 56 y.o.   MRN: [id number]  HPI Patient in today for possible insect bite to abdomen. Onset 3 weeks ago. Patient states that site is burning and painful.  Symptoms been going on for several weeks doesn't seem to be healing up started off as a bug bite then it got worse denied any other particular troubles no high fever vomiting or troubles States no other concerns this visit.    Review of Systems    complains of a sore on her abdomen pain discomfort denies fever chills sweats Objective:   Physical Exam Patient has a small what appears to be cellulitis with a little bit of pus strain edge from a sore on her abdomen no significant abscess noted small amount of pus was exuded in sent for culture bandage was placed-patient not toxic       Assessment & Plan:  Cellulitis-Ultracet-doxycycline with proper instructions given-follow-up in a few weeks to recheck follow-up sooner problems

## 2016-12-26 DIAGNOSIS — Z79899 Other long term (current) drug therapy: Secondary | ICD-10-CM | POA: Diagnosis not present

## 2016-12-26 DIAGNOSIS — E119 Type 2 diabetes mellitus without complications: Secondary | ICD-10-CM | POA: Diagnosis not present

## 2016-12-27 LAB — HEPATIC FUNCTION PANEL
ALBUMIN: 4.4 g/dL (ref 3.5–5.5)
ALT: 23 IU/L (ref 0–32)
AST: 26 IU/L (ref 0–40)
Alkaline Phosphatase: 95 IU/L (ref 39–117)
Bilirubin Total: 0.3 mg/dL (ref 0.0–1.2)
Bilirubin, Direct: 0.11 mg/dL (ref 0.00–0.40)
Total Protein: 7.2 g/dL (ref 6.0–8.5)

## 2016-12-27 LAB — BASIC METABOLIC PANEL
BUN/Creatinine Ratio: 11 (ref 9–23)
BUN: 11 mg/dL (ref 6–24)
CALCIUM: 9.6 mg/dL (ref 8.7–10.2)
CO2: 29 mmol/L (ref 20–29)
Chloride: 100 mmol/L (ref 96–106)
Creatinine, Ser: 1.04 mg/dL — ABNORMAL HIGH (ref 0.57–1.00)
GFR calc Af Amer: 70 mL/min/{1.73_m2} (ref >59)
GFR, EST NON AFRICAN AMERICAN: 61 mL/min/{1.73_m2} (ref >59)
Glucose: 95 mg/dL (ref 65–99)
POTASSIUM: 4.4 mmol/L (ref 3.5–5.2)
Sodium: 141 mmol/L (ref 134–144)

## 2016-12-27 LAB — LIPID PANEL
CHOL/HDL RATIO: 3.2 ratio (ref 0.0–4.4)
CHOLESTEROL TOTAL: 165 mg/dL (ref 100–199)
HDL: 52 mg/dL (ref >39)
LDL CALC: 93 mg/dL (ref 0–99)
Triglycerides: 102 mg/dL (ref 0–149)
VLDL CHOLESTEROL CAL: 20 mg/dL (ref 5–40)

## 2016-12-27 LAB — WOUND CULTURE

## 2016-12-27 LAB — MICROALBUMIN / CREATININE URINE RATIO
Creatinine, Urine: 338.3 mg/dL
MICROALB/CREAT RATIO: 9 mg/g{creat} (ref 0.0–30.0)
MICROALBUM., U, RANDOM: 30.3 ug/mL

## 2017-01-03 ENCOUNTER — Ambulatory Visit: Payer: Medicare Other | Admitting: Family Medicine

## 2017-01-04 ENCOUNTER — Encounter: Payer: Self-pay | Admitting: Family Medicine

## 2017-01-04 ENCOUNTER — Other Ambulatory Visit: Payer: Self-pay | Admitting: Internal Medicine

## 2017-01-04 ENCOUNTER — Other Ambulatory Visit (HOSPITAL_COMMUNITY): Payer: Self-pay | Admitting: Psychiatry

## 2017-01-04 ENCOUNTER — Ambulatory Visit (INDEPENDENT_AMBULATORY_CARE_PROVIDER_SITE_OTHER): Payer: Medicare Other | Admitting: Family Medicine

## 2017-01-04 VITALS — BP 112/74 | Temp 98.6°F | Ht 70.5 in | Wt 260.0 lb

## 2017-01-04 DIAGNOSIS — M549 Dorsalgia, unspecified: Secondary | ICD-10-CM | POA: Diagnosis not present

## 2017-01-04 DIAGNOSIS — L03818 Cellulitis of other sites: Secondary | ICD-10-CM | POA: Diagnosis not present

## 2017-01-04 MED ORDER — ATORVASTATIN CALCIUM 10 MG PO TABS: 10.0000 mg | ORAL_TABLET | Freq: Every day | ORAL | 5 refills | Status: DC

## 2017-01-04 NOTE — Progress Notes (Signed)
   Subjective:    Patient ID: [name], female    DOB: 09/11/1960, 56 y.o.   MRN: [id number]  HPIFollow up Cellulitis on abdomen.  She took the antibiotics she feels she is doing better Burning pain on upper back. Started about one month ago.  Patient denies a numbness or tingling down into the arms no breathing difficulties Patient has been followed by pulmonology for pulmonary nodules  CAT scan was reviewed with the patient importance of quitting smoking reviewed Review of Systems Denies high fever chills sweats nausea vomiting diarrhea denies any severe arm pain    Objective:   Physical Exam Lungs clear heart regular upper back subjective discomfort no abnormality seen Abdomen areas well healed      Assessment & Plan:  Staph infection this is well healed no need for further intervention if it reoccurs to follow-up  Musculoskeletal back pain stretching exercises no need for any prescription medication  Patient encouraged quit smoking  Follow-up in 4 months recheck A1c at that time

## 2017-01-20 DIAGNOSIS — G4733 Obstructive sleep apnea (adult) (pediatric): Secondary | ICD-10-CM | POA: Diagnosis not present

## 2017-02-12 ENCOUNTER — Encounter: Payer: Self-pay | Admitting: Gastroenterology

## 2017-02-12 ENCOUNTER — Ambulatory Visit (INDEPENDENT_AMBULATORY_CARE_PROVIDER_SITE_OTHER): Payer: Medicare Other | Admitting: Gastroenterology

## 2017-02-12 VITALS — BP 116/71 | HR 85 | Temp 97.8°F | Ht 70.5 in | Wt 262.8 lb

## 2017-02-12 DIAGNOSIS — R1310 Dysphagia, unspecified: Secondary | ICD-10-CM | POA: Diagnosis not present

## 2017-02-12 DIAGNOSIS — K5909 Other constipation: Secondary | ICD-10-CM

## 2017-02-12 DIAGNOSIS — R1319 Other dysphagia: Secondary | ICD-10-CM

## 2017-02-12 NOTE — Progress Notes (Signed)
Primary Care Physician: Kathyrn Drown, MD  Primary Gastroenterologist:  Garfield Cornea, MD   Chief Complaint  Patient presents with  . Nausea  . Emesis    HPI: [name] is a 56 y.o. female hereFor follow-up. Since her last office visit she had EGD and colonoscopy back in March. Colonoscopy for high risk colon cancer surveillance, personal history: Polyps. 4 polyps removed., Fragments of hyperplastic polyps. Advised to come back in 5 years. EGD for dysphagia status post empiric esophageal dilation. Exam was normal.  Patient complains of ongoing dysphagia. She does not wear her dentures. States they are a fitting. Encouraged her to follow-up with her dentist. Social worker stated she would help follow-up on this. Patient states when she swallows pills or liquid she feels like stuff get stuck in her upper chest, sometimes coming back up, sometimes causing her to choke on it. Tries to chew up meat with her gums.  Also complains of 5-6 loose stools daily. Continues to take MiraLAX daily despite this. Taking Bentyl 4 times a day as well. Denies abdominal pain, melena, rectal bleeding. Heartburn recently controlled on Protonix.  Current Outpatient Prescriptions  Medication Sig Dispense Refill  . atorvastatin (LIPITOR) 10 MG tablet Take 1 tablet (10 mg total) by mouth daily. 30 tablet 5  . benztropine (COGENTIN) 1 MG tablet Take 1 tablet (1 mg total) by mouth daily. 30 tablet 2  . calcium-vitamin D (OSCAL WITH D) 500-200 MG-UNIT per tablet Take 1 tablet by mouth 3 (three) times daily.      . diclofenac (VOLTAREN) 75 MG EC tablet TAKE 1 TABLET BY MOUTH TWICE A DAY. 60 tablet 5  . dicyclomine (BENTYL) 10 MG capsule TAKE 1 CAPSULE BY MOUTH FOUR TIMES DAILY BEFORE MEALS AND AT BEDTIME FOR LOOSE STOOLS.HOLD FOR CONSTIPATION 120 capsule 3  . lamoTRIgine (LAMICTAL) 100 MG tablet Take 1 tablet (100 mg total) by mouth 2 (two) times daily. 60 tablet 2  . lisinopril (PRINIVIL,ZESTRIL) 5 MG  tablet TAKE 1 TABLET BY MOUTH ONCE A DAY. 30 tablet 5  . metFORMIN (GLUCOPHAGE) 500 MG tablet TAKE (1/2) TABLET BY MOUTH 2 TIMES A DAY. 15 tablet 0  . ondansetron (ZOFRAN ODT) 4 MG disintegrating tablet Take 1 tablet (4 mg total) by mouth every 8 (eight) hours as needed for nausea or vomiting. 20 tablet 0  . paliperidone (INVEGA) 9 MG 24 hr tablet Take 1 tablet (9 mg total) by mouth daily. 30 tablet 2  . pantoprazole (PROTONIX) 40 MG tablet TAKE 1 TABLET BY MOUTH DAILY BEFORE BREAKFAST. 90 tablet 3  . polyethylene glycol (MIRALAX / GLYCOLAX) packet Take 17 g by mouth daily as needed for mild constipation. Reported on 01/27/2016 30 each 3  . potassium chloride (K-DUR) 10 MEQ tablet TAKE 1 TABLET BY MOUTH TWICE A DAY. 60 tablet 5  . propranolol (INDERAL) 10 MG tablet TAKE 1 TABLET BY MOUTH THREE TIMES A DAY. 90 tablet 5  . sertraline (ZOLOFT) 100 MG tablet Take 1 tablet (100 mg total) by mouth daily. 30 tablet 2  . torsemide (DEMADEX) 20 MG tablet TAKE 2 TABLETS BY MOUTH EACH MORNING. 60 tablet 5  . traZODone (DESYREL) 100 MG tablet Take 1 tablet (100 mg total) by mouth at bedtime. 30 tablet 2   No current facility-administered medications for this visit.     Allergies as of 02/12/2017 - Review Complete 02/12/2017  Allergen Reaction Noted  . Thorazine [chlorpromazine hcl] Anaphylaxis 10/20/2010  . Acetaminophen Other (  See Comments)   . Aspirin Other (See Comments)   . Aspirin-acetaminophen-caffeine Other (See Comments)   . Nsaids Nausea And Vomiting   . Penicillins Nausea And Vomiting   . Tomato Rash 01/17/2012    ROS:  General: Negative for anorexia, weight loss, fever, chills, fatigue, weakness. ENT: Negative for hoarseness, difficulty swallowing , nasal congestion. CV: Negative for chest pain, angina, palpitations, dyspnea on exertion, peripheral edema.  Respiratory: Negative for dyspnea at rest, dyspnea on exertion, cough, sputum, wheezing.  GI: See history of present illness. GU:   Negative for dysuria, hematuria, urinary incontinence, urinary frequency, nocturnal urination.  Endo: Negative for unusual weight change.    Physical Examination:   BP 116/71   Pulse 85   Temp 97.8 F (36.6 C) (Oral)   Ht 5' 10.5" (1.791 m)   Wt 262 lb 12.8 oz (119.2 kg)   BMI 37.17 kg/m   General: Well-nourished, well-developed in no acute distress.  Eyes: No icterus. Mouth: Oropharyngeal mucosa moist and pink , no lesions erythema or exudate. Lungs: Clear to auscultation bilaterally.  Heart: Regular rate and rhythm, no murmurs rubs or gallops.  Abdomen: Bowel sounds are normal, nontender, nondistended, no hepatosplenomegaly or masses, no abdominal bruits or hernia , no rebound or guarding.   Extremities: No lower extremity edema. No clubbing or deformities. Neuro: Alert and oriented x 4   Skin: Warm and dry, no jaundice.   Psych: Alert and cooperative, normal mood and affect.  Lab Results  Component Value Date   ALT 23 12/26/2016   AST 26 12/26/2016   ALKPHOS 95 12/26/2016   BILITOT 0.3 12/26/2016    No results found.

## 2017-02-12 NOTE — Patient Instructions (Signed)
1. Xray of your esophagus as planned.  2. Stop your miralax for now. If you have recurrent constipation, you should use it once daily ONLY as needed.

## 2017-02-12 NOTE — Assessment & Plan Note (Signed)
History of IBS, mixed constipation diarrhea. Has been back on MiraLAX and now having 5-6 loose stools daily. Advised her to stop the MiraLAX. Use when necessary only. She can continue Bentyl for now. Call with any other concerns.

## 2017-02-12 NOTE — Assessment & Plan Note (Signed)
Normal esophagus on EGD 6 months ago. No improvement status post empiric esophageal dilation. Need to exclude esophageal diverticulum/stricture. Suspect underlying esophageal motility disorder plus or minus contribution from not wearing her dentures. Barium pill esophagram in the near future. Encouraged her to follow-up with her dentist regarding poor fitting dentures.

## 2017-02-12 NOTE — Progress Notes (Signed)
cc'ed to pcp °

## 2017-02-14 ENCOUNTER — Other Ambulatory Visit (HOSPITAL_COMMUNITY): Payer: Self-pay | Admitting: Psychiatry

## 2017-02-20 DIAGNOSIS — G4733 Obstructive sleep apnea (adult) (pediatric): Secondary | ICD-10-CM | POA: Diagnosis not present

## 2017-02-22 ENCOUNTER — Ambulatory Visit (HOSPITAL_COMMUNITY)
Admission: RE | Admit: 2017-02-22 | Discharge: 2017-02-22 | Disposition: A | Discharge status: Home / Self Care | Payer: Medicare Other | Source: Ambulatory Visit | Attending: Gastroenterology | Admitting: Gastroenterology

## 2017-02-22 DIAGNOSIS — K219 Gastro-esophageal reflux disease without esophagitis: Secondary | ICD-10-CM | POA: Diagnosis not present

## 2017-02-22 DIAGNOSIS — R1310 Dysphagia, unspecified: Secondary | ICD-10-CM | POA: Diagnosis not present

## 2017-02-22 DIAGNOSIS — R1319 Other dysphagia: Secondary | ICD-10-CM

## 2017-02-27 NOTE — Progress Notes (Signed)
No evidence of esophageal dysmotility or stricture. Encourage her to follow up with dentist for ill fitting dentures.  Return ov in 6 months.

## 2017-02-28 ENCOUNTER — Encounter: Payer: Self-pay | Admitting: Internal Medicine

## 2017-02-28 NOTE — Progress Notes (Signed)
PATIENT SCHEDULED  °

## 2017-03-06 DIAGNOSIS — H5203 Hypermetropia, bilateral: Secondary | ICD-10-CM | POA: Diagnosis not present

## 2017-03-06 DIAGNOSIS — E119 Type 2 diabetes mellitus without complications: Secondary | ICD-10-CM | POA: Diagnosis not present

## 2017-03-06 DIAGNOSIS — H52223 Regular astigmatism, bilateral: Secondary | ICD-10-CM | POA: Diagnosis not present

## 2017-03-06 DIAGNOSIS — H524 Presbyopia: Secondary | ICD-10-CM | POA: Diagnosis not present

## 2017-03-06 LAB — HM DIABETES EYE EXAM

## 2017-03-14 ENCOUNTER — Encounter: Payer: Self-pay | Admitting: *Deleted

## 2017-03-22 ENCOUNTER — Ambulatory Visit (INDEPENDENT_AMBULATORY_CARE_PROVIDER_SITE_OTHER): Payer: Medicare Other | Admitting: Psychiatry

## 2017-03-22 ENCOUNTER — Encounter (HOSPITAL_COMMUNITY): Payer: Self-pay | Admitting: Psychiatry

## 2017-03-22 VITALS — BP 108/53 | HR 81 | Ht 70.5 in | Wt 261.0 lb

## 2017-03-22 DIAGNOSIS — Z91410 Personal history of adult physical and sexual abuse: Secondary | ICD-10-CM

## 2017-03-22 DIAGNOSIS — Z811 Family history of alcohol abuse and dependence: Secondary | ICD-10-CM | POA: Diagnosis not present

## 2017-03-22 DIAGNOSIS — Z62810 Personal history of physical and sexual abuse in childhood: Secondary | ICD-10-CM

## 2017-03-22 DIAGNOSIS — F419 Anxiety disorder, unspecified: Secondary | ICD-10-CM

## 2017-03-22 DIAGNOSIS — Z818 Family history of other mental and behavioral disorders: Secondary | ICD-10-CM | POA: Diagnosis not present

## 2017-03-22 DIAGNOSIS — Z736 Limitation of activities due to disability: Secondary | ICD-10-CM

## 2017-03-22 DIAGNOSIS — R450 Nervousness: Secondary | ICD-10-CM

## 2017-03-22 DIAGNOSIS — F251 Schizoaffective disorder, depressive type: Secondary | ICD-10-CM | POA: Diagnosis not present

## 2017-03-22 DIAGNOSIS — Z813 Family history of other psychoactive substance abuse and dependence: Secondary | ICD-10-CM

## 2017-03-22 MED ORDER — BENZTROPINE MESYLATE 1 MG PO TABS: 1.0000 mg | ORAL_TABLET | Freq: Every day | ORAL | 2 refills | Status: DC

## 2017-03-22 MED ORDER — SERTRALINE HCL 100 MG PO TABS: 100.0000 mg | ORAL_TABLET | Freq: Every day | ORAL | 2 refills | Status: DC

## 2017-03-22 MED ORDER — LAMOTRIGINE 100 MG PO TABS: 100.0000 mg | ORAL_TABLET | Freq: Two times a day (BID) | ORAL | 2 refills | Status: DC

## 2017-03-22 MED ORDER — TRAZODONE HCL 100 MG PO TABS: 100.0000 mg | ORAL_TABLET | Freq: Every day | ORAL | 2 refills | Status: DC

## 2017-03-22 MED ORDER — PALIPERIDONE ER 9 MG PO TB24: 9.0000 mg | ORAL_TABLET | Freq: Every day | ORAL | 2 refills | Status: DC
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
## 2017-03-23 DIAGNOSIS — G4733 Obstructive sleep apnea (adult) (pediatric): Secondary | ICD-10-CM | POA: Diagnosis not present

## 2017-04-02 DIAGNOSIS — G4733 Obstructive sleep apnea (adult) (pediatric): Secondary | ICD-10-CM | POA: Diagnosis not present

## 2017-04-06 ENCOUNTER — Encounter: Payer: Self-pay | Admitting: *Deleted

## 2017-04-06 LAB — HM DIABETES EYE EXAM

## 2017-04-17 ENCOUNTER — Other Ambulatory Visit: Payer: Self-pay | Admitting: Gastroenterology

## 2017-04-17 ENCOUNTER — Other Ambulatory Visit (HOSPITAL_COMMUNITY): Payer: Self-pay | Admitting: Psychiatry

## 2017-04-22 DIAGNOSIS — G4733 Obstructive sleep apnea (adult) (pediatric): Secondary | ICD-10-CM | POA: Diagnosis not present

## 2017-04-23 ENCOUNTER — Ambulatory Visit (INDEPENDENT_AMBULATORY_CARE_PROVIDER_SITE_OTHER): Payer: Medicare Other | Admitting: Family Medicine

## 2017-04-23 ENCOUNTER — Encounter: Payer: Self-pay | Admitting: Family Medicine

## 2017-04-23 VITALS — BP 122/82 | Ht 70.5 in | Wt 260.0 lb

## 2017-04-23 DIAGNOSIS — Z23 Encounter for immunization: Secondary | ICD-10-CM

## 2017-04-23 DIAGNOSIS — E119 Type 2 diabetes mellitus without complications: Secondary | ICD-10-CM | POA: Diagnosis not present

## 2017-04-23 LAB — POCT GLYCOSYLATED HEMOGLOBIN (HGB A1C): HEMOGLOBIN A1C: 5.7

## 2017-04-23 MED ORDER — LISINOPRIL 2.5 MG PO TABS: ORAL_TABLET | ORAL | 5 refills | Status: DC

## 2017-04-23 MED ORDER — TORSEMIDE 20 MG PO TABS: ORAL_TABLET | ORAL | 5 refills | Status: DC

## 2017-04-23 NOTE — Progress Notes (Signed)
   Subjective:    Patient ID: [name], female    DOB: 01/12/1961, 56 y.o.   MRN: [id number]  Diabetes  She presents for her follow-up diabetic visit. She has type 2 diabetes mellitus. Pertinent negatives for hypoglycemia include no confusion. Pertinent negatives for diabetes include no chest pain, no fatigue, no polydipsia, no polyphagia and no weakness.  Sees foot Dr. Steffanie Rainwater and eye Dr (Dr.Cotter Last month) Eats what she likes.Does not get exercise.She is on Metformin 500 mg 2 daily.   Review of Systems  Constitutional: Negative for activity change, appetite change and fatigue.  HENT: Negative for congestion.   Respiratory: Negative for cough.   Cardiovascular: Negative for chest pain.  Gastrointestinal: Negative for abdominal pain.  Endocrine: Negative for polydipsia and polyphagia.  Skin: Negative for color change.  Neurological: Negative for weakness.  Psychiatric/Behavioral: Negative for confusion.   Results for orders placed or performed in visit on 04/23/17  POCT glycosylated hemoglobin (Hb A1C)  Result Value Ref Range   Hemoglobin A1C 5.7        Objective:   Physical Exam  Constitutional: She appears well-developed and well-nourished. No distress.  HENT:  Head: Normocephalic and atraumatic.  Eyes: Right eye exhibits no discharge. Left eye exhibits no discharge.  Neck: No tracheal deviation present.  Cardiovascular: Normal rate, regular rhythm and normal heart sounds.   No murmur heard. Pulmonary/Chest: Effort normal and breath sounds normal. No respiratory distress. She has no wheezes. She has no rales.  Musculoskeletal: She exhibits no edema.  Lymphadenopathy:    She has no cervical adenopathy.  Neurological: She is alert. She exhibits normal muscle tone.  Skin: Skin is warm and dry. No erythema.  Psychiatric: Her behavior is normal.  Vitals reviewed.         Assessment & Plan:  Diabetes much better control the what she has had continue current  measures follow-up in approximately 6 months follow-up sooner problems  Comprehensive lab checks on follow-up  Blood pressure relatively low reduce torsemide to 1 tablet each morning reduce lisinopril new dose 2.5 mg

## 2017-04-27 LAB — HM DIABETES EYE EXAM

## 2017-05-18 ENCOUNTER — Other Ambulatory Visit (HOSPITAL_COMMUNITY): Payer: Self-pay | Admitting: Psychiatry

## 2017-05-18 ENCOUNTER — Other Ambulatory Visit: Payer: Self-pay | Admitting: Family Medicine

## 2017-05-18 NOTE — Telephone Encounter (Signed)
Last seen in Oct for DM

## 2017-05-19 NOTE — Telephone Encounter (Signed)
May fill 6 months on lipitor but DC diclofenac send pharmacy notification so they dont keep asking for it!!

## 2017-05-23 DIAGNOSIS — G4733 Obstructive sleep apnea (adult) (pediatric): Secondary | ICD-10-CM | POA: Diagnosis not present

## 2017-06-08 ENCOUNTER — Encounter: Payer: Self-pay | Admitting: Nurse Practitioner

## 2017-06-08 ENCOUNTER — Other Ambulatory Visit: Payer: Self-pay | Admitting: *Deleted

## 2017-06-08 ENCOUNTER — Ambulatory Visit (INDEPENDENT_AMBULATORY_CARE_PROVIDER_SITE_OTHER): Payer: Medicare Other | Admitting: Nurse Practitioner

## 2017-06-08 VITALS — BP 116/70 | Ht 70.5 in | Wt 269.0 lb

## 2017-06-08 DIAGNOSIS — K760 Fatty (change of) liver, not elsewhere classified: Secondary | ICD-10-CM | POA: Diagnosis not present

## 2017-06-08 DIAGNOSIS — Z13220 Encounter for screening for lipoid disorders: Secondary | ICD-10-CM

## 2017-06-08 DIAGNOSIS — Z01419 Encounter for gynecological examination (general) (routine) without abnormal findings: Secondary | ICD-10-CM | POA: Diagnosis not present

## 2017-06-08 DIAGNOSIS — R911 Solitary pulmonary nodule: Secondary | ICD-10-CM

## 2017-06-09 ENCOUNTER — Encounter: Payer: Self-pay | Admitting: Nurse Practitioner

## 2017-06-09 LAB — LIPID PANEL
CHOL/HDL RATIO: 2.4 ratio (ref 0.0–4.4)
Cholesterol, Total: 121 mg/dL (ref 100–199)
HDL: 50 mg/dL (ref >39)
LDL CALC: 49 mg/dL (ref 0–99)
Triglycerides: 108 mg/dL (ref 0–149)
VLDL Cholesterol Cal: 22 mg/dL (ref 5–40)

## 2017-06-09 LAB — HEPATIC FUNCTION PANEL
ALBUMIN: 4.6 g/dL (ref 3.5–5.5)
ALT: 19 IU/L (ref 0–32)
AST: 32 IU/L (ref 0–40)
Alkaline Phosphatase: 111 IU/L (ref 39–117)
Bilirubin Total: 0.2 mg/dL (ref 0.0–1.2)
Bilirubin, Direct: 0.09 mg/dL (ref 0.00–0.40)
TOTAL PROTEIN: 7.4 g/dL (ref 6.0–8.5)

## 2017-06-09 NOTE — Progress Notes (Signed)
   Subjective:    Patient ID: [name], female    DOB: 1960-08-09, 56 y.o.   MRN: [id number]  HPI presents for her wellness exam. Has had a total hysterectomy. No new partners; same one for 5 years. Regular vision exams. Has had all of her teeth extracted. No oral lesions. Occasional walking for activity. Smokes no more than a half pack per day. Her social worker, [name], is present during most of the visit per patient request to offer support. Some difficulty obtaining history due to mental disability.     Review of Systems  Constitutional: Negative for activity change and appetite change.  HENT: Negative for dental problem, ear pain, mouth sores, sinus pressure and sore throat.   Respiratory: Negative for cough, chest tightness, shortness of breath and wheezing.   Cardiovascular: Negative for chest pain.  Gastrointestinal: Negative for abdominal distention, abdominal pain, blood in stool, constipation, diarrhea, nausea and vomiting.  Genitourinary: Negative for difficulty urinating, dysuria, enuresis, frequency, genital sores, pelvic pain, urgency and vaginal discharge.       Objective:   Physical Exam  Constitutional: She is oriented to person, place, and time. She appears well-developed. No distress.  HENT:  Right Ear: External ear normal.  Left Ear: External ear normal.  Mouth/Throat: Oropharynx is clear and moist.  Neck: Normal range of motion. Neck supple. No tracheal deviation present. No thyromegaly present.  Cardiovascular: Normal rate, regular rhythm and normal heart sounds. Exam reveals no gallop.  No murmur heard. Pulmonary/Chest: Effort normal and breath sounds normal. Right breast exhibits no inverted nipple, no mass, no skin change and no tenderness. Left breast exhibits no inverted nipple, no mass, no skin change and no tenderness. Breasts are symmetrical.  Axillae no adenopathy.   Abdominal: Soft. She exhibits no distension. There is no tenderness.    Genitourinary: Vagina normal. No vaginal discharge found.  Genitourinary Comments: External GU: no rashes or lesions noted. Vagina: no discharge. Bimanual exam: no tenderness or obvious masses but exam limited due to abd girth.   Musculoskeletal: She exhibits no edema.  Lymphadenopathy:    She has no cervical adenopathy.  Neurological: She is alert and oriented to person, place, and time.  Skin: Skin is warm and dry. No rash noted.  Psychiatric: She has a normal mood and affect. Her behavior is normal.  Vitals reviewed.         Assessment & Plan:   Problem List Items Addressed This Visit      Digestive   Fatty liver   Relevant Orders   Hepatic function panel (Completed)    Other Visit Diagnoses    Well woman exam    -  Primary   Screening, lipid       Relevant Orders   Lipid panel (Completed)     Encouraged regular activity such as walking around her home every hour. Recommend daily vitamin D/calcium and smoking cessation. Labs pending.  Return in about 4 months (around 10/06/2017).

## 2017-06-11 ENCOUNTER — Other Ambulatory Visit: Payer: Self-pay | Admitting: Family Medicine

## 2017-06-11 NOTE — Telephone Encounter (Signed)
Slight elevation of creatinine on last lab work I do not recommend for this patient to be on NSAIDs please notify the pharmacy we are canceling this prescription

## 2017-06-13 ENCOUNTER — Other Ambulatory Visit: Payer: Self-pay | Admitting: Family Medicine

## 2017-06-13 ENCOUNTER — Other Ambulatory Visit: Payer: Self-pay | Admitting: Gastroenterology

## 2017-06-21 ENCOUNTER — Ambulatory Visit (INDEPENDENT_AMBULATORY_CARE_PROVIDER_SITE_OTHER): Payer: Medicare Other | Admitting: Psychiatry

## 2017-06-21 ENCOUNTER — Encounter (HOSPITAL_COMMUNITY): Payer: Self-pay | Admitting: Psychiatry

## 2017-06-21 VITALS — BP 120/78 | HR 83 | Ht 70.5 in | Wt 267.0 lb

## 2017-06-21 DIAGNOSIS — Z818 Family history of other mental and behavioral disorders: Secondary | ICD-10-CM | POA: Diagnosis not present

## 2017-06-21 DIAGNOSIS — Z736 Limitation of activities due to disability: Secondary | ICD-10-CM

## 2017-06-21 DIAGNOSIS — Z813 Family history of other psychoactive substance abuse and dependence: Secondary | ICD-10-CM | POA: Diagnosis not present

## 2017-06-21 DIAGNOSIS — F251 Schizoaffective disorder, depressive type: Secondary | ICD-10-CM | POA: Diagnosis not present

## 2017-06-21 DIAGNOSIS — Z62810 Personal history of physical and sexual abuse in childhood: Secondary | ICD-10-CM

## 2017-06-21 DIAGNOSIS — Z560 Unemployment, unspecified: Secondary | ICD-10-CM

## 2017-06-21 DIAGNOSIS — F17210 Nicotine dependence, cigarettes, uncomplicated: Secondary | ICD-10-CM

## 2017-06-21 DIAGNOSIS — Z811 Family history of alcohol abuse and dependence: Secondary | ICD-10-CM | POA: Diagnosis not present

## 2017-06-21 MED ORDER — BENZTROPINE MESYLATE 1 MG PO TABS: 1.0000 mg | ORAL_TABLET | Freq: Every day | ORAL | 2 refills | Status: DC

## 2017-06-21 MED ORDER — SERTRALINE HCL 100 MG PO TABS: 100.0000 mg | ORAL_TABLET | Freq: Every day | ORAL | 2 refills | Status: DC

## 2017-06-21 MED ORDER — PALIPERIDONE ER 9 MG PO TB24: 9.0000 mg | ORAL_TABLET | Freq: Every day | ORAL | 2 refills | Status: DC

## 2017-06-21 MED ORDER — LAMOTRIGINE 100 MG PO TABS: 100.0000 mg | ORAL_TABLET | Freq: Two times a day (BID) | ORAL | 2 refills | Status: DC

## 2017-06-21 MED ORDER — TRAZODONE HCL 100 MG PO TABS: 100.0000 mg | ORAL_TABLET | Freq: Every day | ORAL | 2 refills | Status: DC
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
## 2017-06-22 DIAGNOSIS — G4733 Obstructive sleep apnea (adult) (pediatric): Secondary | ICD-10-CM | POA: Diagnosis not present

## 2017-07-05 ENCOUNTER — Other Ambulatory Visit: Payer: Self-pay

## 2017-07-05 ENCOUNTER — Ambulatory Visit: Payer: Self-pay | Admitting: Cardiothoracic Surgery

## 2017-07-05 ENCOUNTER — Telehealth: Payer: Self-pay | Admitting: Family Medicine

## 2017-07-05 NOTE — Progress Notes (Deleted)
[address]       [place] [address]             [phone]       [name] [facility] Medical Record #[id number] Date of Birth: 10/31/1960  Referring: Kathyrn Drown, MD Primary Care: Kathyrn Drown, MD  Chief Complaint:    Lung Nodule   History of Present Illness:  Patient comes in today with a follow-up CT scan. She is a long-term smoker since age 55 and to a significant degree since age 40, she notes that she smokes a cigarette every 4 hours. She was first noted in July 2013 to have a small incidental right middle lobe lung nodule, she has had serial CT scans to follow this nodule. The location makes it difficult to biopsy.    She's had no history of tuberculosis or known tuberculosis exposure.  She does give a history of having a myocardial infarction in being hospitalized for 5 days at age 12 no other details about this revealed. She does note that at times especially with exertion she feels a pounding heart and chest discomfort with exertional shortness of breath. She denies resting shortness of breath.  She does give a history of having a right-sided stroke in the past.     Current Activity/ Functional Status: Patient  is independent with mobility/ambulation, transfers, ADL's, IADL's. T     Past Medical History  Diagnosis Date  . Mental retardation   . Hyperglycemia   . HTN (hypertension)   . Migraines   . Anxiety   . IBS (irritable bowel syndrome)   . Diabetes mellitus   . Lung nodules     right, followed by PCP, PET 11/2011    Past Surgical History:  Procedure Laterality Date  . ABDOMINAL HYSTERECTOMY    . COLONOSCOPY  11/2007   hyperplastic polyps, prior hx of adenomas   . COLONOSCOPY  05/2010   incomplete due to poor prep, hyperplastic rectal polyp  . COLONOSCOPY  05/05/2002   Dimunitive polyps in the rectum and left colon, cold    biopsied/removed.  Scattered few left-sided diverticula.  Regular colonic   mucosa  appeared normal  . COLONOSCOPY N/A 05/28/2013   Rourk: mulitple tubular adenomas removed. next tcs 05/2016  . COLONOSCOPY WITH PROPOFOL N/A 09/07/2016   Dr. Gala Romney: For hyperplastic polyps removed. Next colonoscopy March 2023 given history of adenomatous colon polyps in the past.  . ESOPHAGOGASTRODUODENOSCOPY  08/2007   moderate sized hiatal hernia  . ESOPHAGOGASTRODUODENOSCOPY  05/2010   noncritical schatzki ring s/p 38F  . ESOPHAGOGASTRODUODENOSCOPY (EGD) WITH ESOPHAGEAL DILATION N/A 02/06/2013   GEZ:MOQHUT esophagus-s/p dilation up to a 91 [place] size with [facility] dilators.  Hiatal hernia  . ESOPHAGOGASTRODUODENOSCOPY (EGD) WITH PROPOFOL N/A 09/07/2016   Dr. Gala Romney: Normal, status post empiric dilation of the esophagus for history of dysphagia  . EXTERNAL EAR SURGERY     bilateral  . FOOT SURGERY    . GLAUCOMA SURGERY    . MALONEY DILATION N/A 09/07/2016   Procedure: Venia Minks DILATION;  Surgeon: Daneil Dolin, MD;  Location: [facility];  Service: Endoscopy;  Laterality: N/A;  . POLYPECTOMY  09/07/2016   Procedure: POLYPECTOMY;  Surgeon: Daneil Dolin, MD;  Location: [facility];  Service: Endoscopy;;  sigmoid colon x4  . small bowel capsule  10/2007   normal    Family History  Problem Relation Age of Onset  . Stroke Mother   .  Heart attack Father   . Schizophrenia Other   . Drug abuse Other   . Alcohol abuse Other   . Colon cancer Unknown        aunt  . Obesity Unknown   . COPD Unknown   . GER disease Unknown   . Diabetes type II Unknown   . Anxiety disorder Unknown   . Depression Unknown   . Depression Sister   . Schizophrenia Sister   . Liver disease Neg Hx   . Inflammatory bowel disease Neg Hx       Social History   Tobacco Use  Smoking Status Current Every Day Smoker  . Packs/day: 0.25  . Years: 40.00  . Pack years: 10.00  . Types: Cigarettes  Smokeless Tobacco Never Used    History  Alcohol Use No   Says does not drink alcohol      Allergies  Allergen  Reactions  . Thorazine [Chlorpromazine Hcl] Anaphylaxis  . Acetaminophen Other (See Comments)    Makes pt dizzy  . Aspirin Other (See Comments)    seizure  . Aspirin-Acetaminophen-Caffeine Other (See Comments)    seizure  . Nsaids Nausea And Vomiting  . Penicillins Nausea And Vomiting    Has patient had a PCN reaction causing immediate rash, facial/tongue/throat swelling, SOB or lightheadedness with hypotension:Yes Has patient had a PCN reaction causing severe rash involving mucus membranes or skin necrosis:Yes Has patient had a PCN reaction that required hospitalization:Yes Has patient had a PCN reaction occurring within the last 10 years:No If all of the above answers are "NO", then may proceed with Cephalosporin use.   . Tomato Rash    Current Outpatient Medications  Medication Sig Dispense Refill  . atorvastatin (LIPITOR) 10 MG tablet TAKE 1 TABLET BY MOUTH ONCE A DAY. 30 tablet 5  . benztropine (COGENTIN) 1 MG tablet Take 1 tablet (1 mg total) by mouth daily. 30 tablet 2  . calcium-vitamin D (OSCAL WITH D) 500-200 MG-UNIT per tablet Take 1 tablet by mouth 3 (three) times daily.      . diclofenac (VOLTAREN) 75 MG EC tablet TAKE 1 TABLET BY MOUTH TWICE A DAY. 60 tablet 5  . dicyclomine (BENTYL) 10 MG capsule TAKE 1 CAPSULE BY MOUTH FOUR TIMES DAILY BEFORE MEALS AND AT BEDTIME FOR LOOSE STOOLS.HOLD FOR CONSTIPATION 120 capsule 1  . dicyclomine (BENTYL) 10 MG capsule TAKE 1 CAPSULE BY MOUTH FOUR TIMES DAILY BEFORE MEALS AND AT BEDTIME FOR LOOSE STOOLS.HOLD FOR CONSTIPATION 120 capsule 1  . lamoTRIgine (LAMICTAL) 100 MG tablet Take 1 tablet (100 mg total) by mouth 2 (two) times daily. 60 tablet 2  . lisinopril (PRINIVIL,ZESTRIL) 2.5 MG tablet I qam 30 tablet 5  . metFORMIN (GLUCOPHAGE) 500 MG tablet TAKE (1/2) TABLET BY MOUTH 2 TIMES A DAY. (Patient taking differently: TAKE (1/2) TABLET BY MOUTH 2 TIMES A DAY. Taking one Bid) 15 tablet 0  . ondansetron (ZOFRAN ODT) 4 MG disintegrating  tablet Take 1 tablet (4 mg total) by mouth every 8 (eight) hours as needed for nausea or vomiting. 20 tablet 0  . paliperidone (INVEGA) 9 MG 24 hr tablet Take 1 tablet (9 mg total) by mouth daily. 30 tablet 2  . pantoprazole (PROTONIX) 40 MG tablet TAKE 1 TABLET BY MOUTH DAILY BEFORE BREAKFAST. 90 tablet 3  . polyethylene glycol (MIRALAX / GLYCOLAX) packet Take 17 g by mouth daily as needed for mild constipation. Reported on 01/27/2016 30 each 3  . potassium chloride (K-DUR) 10 MEQ tablet  TAKE 1 TABLET BY MOUTH TWICE A DAY. 60 tablet 5  . potassium chloride (K-DUR) 10 MEQ tablet TAKE 1 TABLET BY MOUTH TWICE A DAY. 60 tablet 5  . propranolol (INDERAL) 10 MG tablet TAKE 1 TABLET BY MOUTH THREE TIMES A DAY. 90 tablet 5  . propranolol (INDERAL) 10 MG tablet TAKE 1 TABLET BY MOUTH THREE TIMES A DAY. 90 tablet 5  . sertraline (ZOLOFT) 100 MG tablet Take 1 tablet (100 mg total) by mouth daily. 30 tablet 2  . torsemide (DEMADEX) 20 MG tablet TAKE 1 TABLETS BY MOUTH EACH MORNING. 30 tablet 5  . traZODone (DESYREL) 100 MG tablet Take 1 tablet (100 mg total) by mouth at bedtime. 30 tablet 2   No current facility-administered medications for this visit.        Review of Systems:     ROS   Physical Exam: .There were no vitals taken for this visit.  {physical exam:21449}  Diagnostic Studies & Laboratory data:     Recent Radiology Findings:     Ct Chest Wo Contrast  Result Date: 12/21/2016 CLINICAL DATA:  Followup of right-sided pulmonary nodule. EXAM: CT CHEST WITHOUT CONTRAST TECHNIQUE: Multidetector CT imaging of the chest was performed following the standard protocol without IV contrast. COMPARISON:  04/20/2016 FINDINGS: Cardiovascular: Aortic and branch vessel atherosclerosis. Borderline cardiomegaly, without pericardial effusion. Lad coronary artery atherosclerosis. Mediastinum/Nodes: Small bilateral axillary nodes are unchanged and not pathologic by size criteria. No mediastinal or  definite hilar adenopathy, given limitations of unenhanced CT. Lungs/Pleura: No pleural fluid. Mild motion degradation inferiorly. Right middle lobe pulmonary nodule is in the region of motion degradation. 1.8 x 1.0 cm on image 62/series 4. Compare 1.6 x 1.0 cm on the prior exam. 2.0 cm craniocaudal on image 61 coronal, similar. No new pulmonary nodule identified. Upper Abdomen: Moderate hepatic steatosis with sparing in the posterior aspect of segment 4. Normal imaged portions of the spleen, stomach, pancreas, adrenal glands, kidneys. Musculoskeletal: No acute osseous abnormality. IMPRESSION: 1. Mild motion degradation, including through the nodule of concern in the right middle lobe. 2. Given this limitation, the right middle lobe nodules is felt to be similar over 6 months. Consider repeat exam at 3-6 months, given today's limitations. 3. Age advanced coronary artery atherosclerosis. Recommend assessment of coronary risk factors and consideration of medical therapy. 4.  Aortic Atherosclerosis (ICD10-I70.0). 5. Hepatic steatosis. Electronically Signed   By: Abigail Miyamoto M.D.   On: 12/21/2016 11:41      Ct Chest Wo Contrast  Result Date: 04/20/2016 CLINICAL DATA:  Followup pulmonary nodule EXAM: CT CHEST WITHOUT CONTRAST TECHNIQUE: Multidetector CT imaging of the chest was performed following the standard protocol without IV contrast. COMPARISON:  02/11/2015 FINDINGS: Cardiovascular: Aortic atherosclerosis. Calcification in the LAD coronary artery noted. Normal heart size. No pericardial effusion. Mediastinum/Nodes: No enlarged mediastinal or axillary lymph nodes. Thyroid gland, trachea, and esophagus demonstrate no significant findings. Lungs/Pleura: No pleural effusion. Mild changes of emphysema. Diffuse bronchial wall thickening noted. The nodule of concern within the right middle lobe measures 1.6 by 1.0 cm, image 69 of series 4. Mean diameter is equal to 1.3 cm. On the study from 2014 this had a mean  diameter of 9 mm. No new pulmonary nodules identified. Upper Abdomen: No acute abnormality.The adrenal glands appear normal. Mild hepatic steatosis. Musculoskeletal: No chest wall mass or suspicious bone lesions identified. IMPRESSION: 1. The nodule within the right middle lobe has a mean diameter 13 mm on today's study. This is compared  with 9 mm in 2014. Although this has a nonaggressive appearance and is somewhat circumscribed given the interval growth a PET-CT is advised. 2. Aortic atherosclerosis.  LAD coronary artery calcification noted. Electronically Signed   By: Kerby Moors M.D.   On: 04/20/2016 11:33   Ct Chest Wo Contrast  02/11/2015   CLINICAL DATA:  Follow-up lung nodule. Mid left chest pain and productive cough. History of right breast cancer.  EXAM: CT CHEST WITHOUT CONTRAST  TECHNIQUE: Multidetector CT imaging of the chest was performed following the standard protocol without IV contrast.  COMPARISON:  Chest CT 01/27/2014. ; chest CT 01/15/2014 ; chest CT 11/01/2011.  FINDINGS: Mediastinum/Nodes: Visualized thyroid is unremarkable. No enlarged axillary, mediastinal or hilar lymphadenopathy. Normal heart size.  Lungs/Pleura: Central airways are patent. No consolidative pulmonary opacities. There is an 11 x 8 mm well-circumscribed nodule within the medial segment of the right middle lobe (image 38; series 4). This is unchanged from prior exam 01/27/2014 and minimally increased in size from CT 11/01/2011. No other suspicious appearing pulmonary nodules or masses. No pleural effusion or pneumothorax.  Upper abdomen: Hepatic steatosis.  Musculoskeletal: No aggressive or acute appearing osseous lesions.  IMPRESSION: No significant interval change in size of oval circumscribed nodule within the medial segment right middle lobe. Given stability over time, this is favored to represent a benign process. As discussed on prior CT, an additional followup chest CT in 1-2 years is recommended to ensure  continued stability.   Electronically Signed   By: Lovey Newcomer M.D.   On: 02/11/2015 11:10   Ct Chest Wo Contrast  01/15/2014   CLINICAL DATA:  Shortness of breath. Cough. Hemoptysis. History of smoking. Followup evaluation of lung nodule.  EXAM: CT CHEST WITHOUT CONTRAST  TECHNIQUE: Multidetector CT imaging of the chest was performed following the standard protocol without IV contrast.  COMPARISON:  Chest CT 01/02/2013.  FINDINGS: Mediastinum: Heart size is normal. There is no significant pericardial fluid, thickening or pericardial calcification. There is atherosclerosis of the thoracic aorta, the great vessels of the mediastinum and the coronary arteries, including calcified atherosclerotic plaque in the left anterior descending coronary artery. No pathologically enlarged mediastinal or hilar lymph nodes. Please note that accurate exclusion of hilar adenopathy is limited on noncontrast CT scans. Esophagus is unremarkable in appearance.  Lungs/Pleura: 12 x 7 mm well-circumscribed nodule in the mid medial segment of the right middle lobe (image 38 of series 4) is only slightly larger than remote prior study 11/01/2011 at which point it measured 11 x 7 mm when measured in a similar fashion on image 34 of series 3 of that examination. No other suspicious appearing pulmonary nodules or masses. No acute consolidative airspace disease. No pleural effusions.  Upper Abdomen: Unremarkable.  Musculoskeletal: There are no aggressive appearing lytic or blastic lesions noted in the visualized portions of the skeleton.  IMPRESSION: 1. 12 x 7 mm well-circumscribed nodule in the mid medial segment of the right middle lobe (image 38 of series 4) is only slightly larger than remote prior study 11/01/2011 at which point it measured 11 x 7 mm when measured in a similar fashion on image 34 of series 3 of that examination. Given the relative stability over this 2 year time interval, this is strongly favored to represent a benign  lesion, potentially a hammartoma (some hammartomas contain brown fat, which may account for low-level metabolic activity on the prior PET-CT). Another differential consideration would be in a low-grade carcinoid tumor. An additional noncontrast  chest CT in 1-2 years is suggested to ensure continued stability.   Electronically Signed   By: Vinnie Langton M.D.   On: 01/15/2014 13:24  Ct Chest Wo Contrast  01/02/2013   *RADIOLOGY REPORT*  Clinical Data: Right lung nodule.  History of breast cancer.  CT CHEST WITHOUT CONTRAST  Technique:  Multidetector CT imaging of the chest was performed following the standard protocol without IV contrast.  Comparison: Chest CT 04/22/2012.  Findings: The right middle lobe pulmonary nodule is unchanged. Measures 10.7 x 6.9 mm and previous measure 10.3 x 7.4 mm.  No new nodules are identified.  No acute pulmonary findings.  No pleural effusion.  The heart is normal in size.  No pericardial effusion.  No mediastinal or hilar lymphadenopathy.  Small scattered lymph nodes are stable.  The aorta is normal in caliber.  The esophagus is grossly normal.  The chest wall is unremarkable.  Stable calcifications of the right breast.  No supraclavicular or axillary adenopathy.  Stable bilateral axillary lymph nodes.  The thyroid gland is normal.  The bony thorax is intact.  IMPRESSION:  Stable right middle lobe pulmonary nodule.  Recommend follow-up noncontrast chest CT in 1 year to document stability.   Original Report Authenticated By: Marijo Sanes, M.D.     Ct Chest W Contrast  04/22/2012  *RADIOLOGY REPORT*  Clinical Data: Follow-up pulmonary nodule.  CT CHEST WITH CONTRAST  Technique:  Multidetector CT imaging of the chest was performed following the standard protocol during bolus administration of intravenous contrast.  Contrast: 7mL OMNIPAQUE IOHEXOL 300 MG/ML  SOLN  Comparison: PET 11/29/2011 and CT chest 11/01/2011.  Findings: No pathologically enlarged mediastinal, hilar or  axillary lymph nodes.  Heart size normal.  No pericardial effusion.  A nodule in the right middle lobe measures 10 x 7 mm, stable from 11/01/2011 when remeasured.  A tiny subpleural nodular density in the right lower lobe measures 3 mm (image 42), stable.  No pleural fluid.  Airway is unremarkable.  Incidental imaging of the upper abdomen shows low attenuation throughout the visualized portion of the liver.  No worrisome lytic or sclerotic lesions.  IMPRESSION: Right middle lobe nodule is unchanged from 11/01/2011.  Given mild hypermetabolism on 11/29/2011, a follow-up interval of 6 months is recommended to confirm continued stability.   Original Report Authenticated By: Luretha Rued, M.D.      11/09/2011 RADIOLOGY REPORT*  Clinical Data: Right basilar pulmonary nodule on abdominal CT.  Dysphagia. The patient has diabetes mellitus and unspecified  mental retardation.  CT CHEST WITH CONTRAST  Technique: Multidetector CT imaging of the chest was performed  following the standard protocol during bolus administration of  intravenous contrast.  Contrast: 40mL OMNIPAQUE IOHEXOL 300 MG/ML SOLN  Comparison: Abdominal CTs 10/10/2011 and 01/19/2005. Chest  radiographs 10/23/2001.  Findings: As demonstrated on the recent abdominal CT, there is a  lobulated well circumscribed 10 x 9 mm right middle lobe nodule on  image 35. This is noncalcified. This area of the lung bases was  not imaged on the 2006 CT. No other pulmonary nodules are  identified. There is no airspace disease or confluent airspace  opacity.  There are no enlarged mediastinal or hilar lymph nodes. There is  no pleural or pericardial effusion. Minimal aortic atherosclerosis  is noted.  Images through the upper abdomen demonstrate diffuse hepatic  steatosis. There is no adrenal mass.  IMPRESSION:  1. Lobulated noncalcified 1-cm right middle lobe nodule was not  imaged on prior  abdominal CT. This nodule is not conclusively    demonstrated on radiographs such that stability cannot be  addressed.  2. This nodule is nonspecific and could reflect a hamartoma or  other benign lesion. However, malignancy cannot be excluded.  3. No other nodules, adenopathy or acute findings.  This nodule would be difficult to biopsy given its close proximity  to the heart. Management options include PET CT to assess for  hypermetabolic activity and follow-up CT (depending on risk factors  for malignancy). If follow-up is elected, follow-up chest CT at 3  months is recommended. This recommendation is adapted from the  consensus statement: Guidelines for Management of Small Pulmonary  Nodules Detected on CT Scans: A Statement from the [place] as published in Radiology 2005; 237:395-400.  Original Report Authenticated By: Vivia Ewing, M.D.  Halina Andreas, MD Tue Jan 09, 2012 2:52:39 PM EDT       **ADDENDUM** CREATED: 01/09/2012 14:43:25  Voice recognition errors:  Within the Findings section, the second sentence of the CHEST  section should read: "NO additional hypermetabolic nodules are  present."  Within the first IMPRESSION, the last phrase of the first sentence  should read: "for a nodule this SIZE."  Findings discussed with Dr. Servando Snare on 07/ 08/2011  **END ADDENDUM** SIGNED BY: Suzy Bouchard, M.D.      Study Result     *RADIOLOGY REPORT*  Clinical Data: Recent imaging demonstrates a solitary pulmonary  nodule. FDG PET CT requested to evaluate for possible malignancy.  NUCLEAR MEDICINE PET SKULL BASE TO THIGH  Fasting Blood Glucose: 113  Technique: 18.4 mCi F-18 FDG was injected intravenously via the  right antecubital fossa. CT data was obtained and used for  attenuation correction and anatomic localization only. (This was  not acquired as a diagnostic CT examination.) Additional exam  technical data entered on technologist worksheet.  Comparison: CT 11/01/2011  Findings:  Head/Neck: No  hypermetabolic lymph nodes in the neck.  Chest: Within the right middle lobe there is a 8 mm pulmonary  nodule adjacent to the right atrium with mild metabolic activity (  SUV max = 2.5). Additional hypermetabolic nodules are present. No  hypermetabolic mediastinal lymph nodes.  There is a single hypermetabolic right axillary lymph node which is  normal pathology. There is misregistration in the right axilla due  to patient motion.  Abdomen/Pelvis: No abnormal hypermetabolic activity within the  liver, pancreas, adrenal glands, or spleen. No hypermetabolic  lymph nodes in the abdomen or pelvis.  Skeleton: No focal hypermetabolic activity to suggest skeletal  metastasis. Sclerosis of the left SI joint appears benign.  IMPRESSION:  1. Mildly hypermetabolic nodule within the right middle lobe.  Although the activity is mild, for a nodule this side this is  concerning although nonspecific. If biopsy is not feasible due to  location, recommend short-term follow-up CT with contrast in 1 to  3 months.  2. No evidence of metastasis.  3. Hypermetabolic right axial lymph node is likely reactive.  Original Report Authenticated By: Suzy Bouchard, M.D.       Recent Lab Findings: Lab Results  Component Value Date   WBC 5.9 09/04/2016   HGB 12.8 09/04/2016   HCT 37.8 09/04/2016   PLT 222 09/04/2016   GLUCOSE 95 12/26/2016   CHOL 121 06/08/2017   TRIG 108 06/08/2017   HDL 50 06/08/2017   LDLCALC 49 06/08/2017   ALT 19 06/08/2017   AST 32 06/08/2017   NA 141 12/26/2016  K 4.4 12/26/2016   CL 100 12/26/2016   CREATININE 1.04 (H) 12/26/2016   BUN 11 12/26/2016   CO2 29 12/26/2016   HGBA1C 5.7 04/23/2017      Assessment / Plan:    Grace Isaac MD  Beeper [phone] Office [phone] 07/05/2017 7:47 AM

## 2017-07-05 NOTE — Telephone Encounter (Signed)
Pt is requesting a prescription for a cane. Pt states that she lost the cane that she had. Please advise.   [facility]

## 2017-07-06 NOTE — Telephone Encounter (Signed)
Await signature

## 2017-07-06 NOTE — Telephone Encounter (Signed)
The prescription for a cane, unsteady gait

## 2017-07-09 NOTE — Telephone Encounter (Signed)
I called and spoke with Sw Jenifer Riter and she is aware and will notify the pt.

## 2017-07-12 ENCOUNTER — Telehealth: Payer: Self-pay

## 2017-07-12 NOTE — Telephone Encounter (Signed)
Pt's Sw Rogelia Mire called states the pt is complaining of bilateral leg edema and they both ache. No other complaints. I instructed the Sw to have pt to be evaluated at the [facility] as we have no availability today and the pt may need a scan to evaluate for a DVT. Sw will discuss with the patient and call back if need be.

## 2017-07-12 NOTE — Telephone Encounter (Signed)
I called and left a message on the SW phone to r/c.

## 2017-07-12 NOTE — Telephone Encounter (Signed)
If they would like to be seen tomorrow that would be fine

## 2017-07-12 NOTE — Telephone Encounter (Signed)
I spoke with the sw Lattie Haw and she will have pt come in tomorrow at 1:45 pm.

## 2017-07-13 ENCOUNTER — Encounter: Payer: Self-pay | Admitting: Family Medicine

## 2017-07-13 ENCOUNTER — Ambulatory Visit (INDEPENDENT_AMBULATORY_CARE_PROVIDER_SITE_OTHER): Payer: Medicare Other | Admitting: Family Medicine

## 2017-07-13 VITALS — BP 124/70 | Temp 98.5°F | Ht 70.5 in | Wt 260.0 lb

## 2017-07-13 DIAGNOSIS — M545 Low back pain, unspecified: Secondary | ICD-10-CM

## 2017-07-13 DIAGNOSIS — M5431 Sciatica, right side: Secondary | ICD-10-CM

## 2017-07-13 MED ORDER — DICLOFENAC SODIUM 75 MG PO TBEC: 75.0000 mg | DELAYED_RELEASE_TABLET | Freq: Two times a day (BID) | ORAL | 5 refills | Status: DC

## 2017-07-13 MED ORDER — TRAMADOL HCL 50 MG PO TABS: 50.0000 mg | ORAL_TABLET | Freq: Three times a day (TID) | ORAL | 0 refills | Status: DC | PRN

## 2017-07-13 NOTE — Patient Instructions (Signed)
Currently right now I believe you are having sciatic pain down your right leg.  More than likely this is because of a pinched nerve in your back.  We are going to try anti-inflammatories.  You may use tramadol sparingly for severe pain.  The tramadol is not meant for long-term use.  In addition to this we will also have physical therapy come see you.  You have a follow-up visit in March-please feel free to follow-up sooner if ongoing troubles

## 2017-07-13 NOTE — Progress Notes (Signed)
   Subjective:    Patient ID: [name], female    DOB: 1961-04-03, 57 y.o.   MRN: [id number]  Leg Pain   Incident onset: one month or more. Pain location: both legs and feet. She has tried acetaminophen for the symptoms. The treatment provided no relief.   Coughing, headache, sore throat, ear pain, wheezing, abdominal pain, vomiting and diarrhea for over 2 months.  This patient relates a little bit of head congestion drainage and coughing denies high fever chills wheezing  She also relates right leg pain discomfort goes from the down her leg she uses a cane to walk around with she relates low back pain at times she states some numbness  Often is difficult to fully ascertain the level of the patient's symptoms because of her significant symptomatology of multiple illnesses  Review of Systems  Constitutional: Negative for activity change and fever.  HENT: Positive for congestion and rhinorrhea. Negative for ear pain.   Eyes: Negative for discharge.  Respiratory: Positive for cough. Negative for shortness of breath and wheezing.   Cardiovascular: Negative for chest pain.       Objective:   Physical Exam  Constitutional: She appears well-developed.  HENT:  Head: Normocephalic.  Right Ear: External ear normal.  Left Ear: External ear normal.  Nose: Nose normal.  Mouth/Throat: Oropharynx is clear and moist. No oropharyngeal exudate.  Eyes: Right eye exhibits no discharge. Left eye exhibits no discharge.  Neck: Neck supple. No tracheal deviation present.  Cardiovascular: Normal rate and normal heart sounds.  No murmur heard. Pulmonary/Chest: Effort normal and breath sounds normal. She has no wheezes. She has no rales.  Lymphadenopathy:    She has no cervical adenopathy.  Skin: Skin is warm and dry.  Nursing note and vitals reviewed.   I find no evidence of severe weakness I do not feel that this patient needs an MRI or surgery at this point      Assessment & Plan:    Lumbar pain with sciatica down the right leg Anti-inflammatory twice a day on a regular basis the patient had ran out of these Face-to-face evaluation was done for the purpose of helping her with her back and leg pain she would benefit from physical therapy but she is unable to go to physical therapy because she does not drive and she is homebound to her apartment Tramadol not intended for long-term use 1 every 8 hours as needed for severe pain face-to-face evaluation was done for this patient She would benefit from a physical therapy evaluation and home physical therapy This patient has disabilities does not drive is homebound  Patient will follow-up in March follow-up sooner if ongoing troubles  Viral illness probable I find no evidence of pneumonia if progressive symptoms or if worse follow-up

## 2017-07-15 ENCOUNTER — Telehealth: Payer: Self-pay | Admitting: Family Medicine

## 2017-07-15 NOTE — Telephone Encounter (Signed)
Nurses please do by July 20, 2017-FL to form was filled out but needs medication list attached to the forearm then forwarded back to social services thank you

## 2017-07-16 NOTE — Telephone Encounter (Signed)
Please have Erica print medication list, place simple cover letter on medication list and fax it to her social service worker thank you

## 2017-07-16 NOTE — Telephone Encounter (Signed)
Form was mailed before being able to put med list with it.

## 2017-07-17 ENCOUNTER — Ambulatory Visit (HOSPITAL_COMMUNITY)
Admission: RE | Admit: 2017-07-17 | Discharge: 2017-07-17 | Disposition: A | Discharge status: Home / Self Care | Payer: Medicare Other | Source: Ambulatory Visit | Attending: Family Medicine | Admitting: Family Medicine

## 2017-07-17 DIAGNOSIS — I708 Atherosclerosis of other arteries: Secondary | ICD-10-CM | POA: Diagnosis not present

## 2017-07-17 DIAGNOSIS — M545 Low back pain: Secondary | ICD-10-CM | POA: Diagnosis not present

## 2017-07-17 DIAGNOSIS — M4807 Spinal stenosis, lumbosacral region: Secondary | ICD-10-CM | POA: Insufficient documentation

## 2017-07-17 DIAGNOSIS — M5136 Other intervertebral disc degeneration, lumbar region: Secondary | ICD-10-CM | POA: Diagnosis not present

## 2017-07-18 ENCOUNTER — Encounter: Payer: Self-pay | Admitting: Family Medicine

## 2017-07-23 DIAGNOSIS — G4733 Obstructive sleep apnea (adult) (pediatric): Secondary | ICD-10-CM | POA: Diagnosis not present

## 2017-07-25 ENCOUNTER — Telehealth: Payer: Self-pay | Admitting: Family Medicine

## 2017-07-25 DIAGNOSIS — M545 Low back pain: Secondary | ICD-10-CM | POA: Diagnosis not present

## 2017-07-25 DIAGNOSIS — R262 Difficulty in walking, not elsewhere classified: Secondary | ICD-10-CM | POA: Diagnosis not present

## 2017-07-25 DIAGNOSIS — M5431 Sciatica, right side: Secondary | ICD-10-CM | POA: Diagnosis not present

## 2017-07-25 NOTE — Telephone Encounter (Signed)
I called and left a message to r/c. 

## 2017-07-25 NOTE — Telephone Encounter (Signed)
Requesting verbal order for PT for 2 times a week for 3 weeks to address low back pain.

## 2017-07-25 NOTE — Telephone Encounter (Signed)
May have this verbal order

## 2017-07-26 NOTE — Telephone Encounter (Signed)
Verbal order given to home health

## 2017-07-27 DIAGNOSIS — M545 Low back pain: Secondary | ICD-10-CM | POA: Diagnosis not present

## 2017-07-27 DIAGNOSIS — R262 Difficulty in walking, not elsewhere classified: Secondary | ICD-10-CM | POA: Diagnosis not present

## 2017-07-27 DIAGNOSIS — M5431 Sciatica, right side: Secondary | ICD-10-CM | POA: Diagnosis not present

## 2017-07-31 DIAGNOSIS — M545 Low back pain: Secondary | ICD-10-CM | POA: Diagnosis not present

## 2017-07-31 DIAGNOSIS — M5431 Sciatica, right side: Secondary | ICD-10-CM | POA: Diagnosis not present

## 2017-07-31 DIAGNOSIS — R262 Difficulty in walking, not elsewhere classified: Secondary | ICD-10-CM | POA: Diagnosis not present

## 2017-08-02 DIAGNOSIS — M5431 Sciatica, right side: Secondary | ICD-10-CM | POA: Diagnosis not present

## 2017-08-02 DIAGNOSIS — M545 Low back pain: Secondary | ICD-10-CM | POA: Diagnosis not present

## 2017-08-02 DIAGNOSIS — R262 Difficulty in walking, not elsewhere classified: Secondary | ICD-10-CM | POA: Diagnosis not present

## 2017-08-07 ENCOUNTER — Encounter: Payer: Self-pay | Admitting: Gastroenterology

## 2017-08-07 ENCOUNTER — Ambulatory Visit (INDEPENDENT_AMBULATORY_CARE_PROVIDER_SITE_OTHER): Payer: Medicare Other | Admitting: Gastroenterology

## 2017-08-07 VITALS — BP 130/77 | HR 76 | Temp 96.9°F | Ht 70.5 in | Wt 261.6 lb

## 2017-08-07 DIAGNOSIS — K582 Mixed irritable bowel syndrome: Secondary | ICD-10-CM

## 2017-08-07 DIAGNOSIS — M545 Low back pain: Secondary | ICD-10-CM | POA: Diagnosis not present

## 2017-08-07 DIAGNOSIS — R197 Diarrhea, unspecified: Secondary | ICD-10-CM

## 2017-08-07 DIAGNOSIS — M5431 Sciatica, right side: Secondary | ICD-10-CM | POA: Diagnosis not present

## 2017-08-07 DIAGNOSIS — K219 Gastro-esophageal reflux disease without esophagitis: Secondary | ICD-10-CM

## 2017-08-07 DIAGNOSIS — R262 Difficulty in walking, not elsewhere classified: Secondary | ICD-10-CM | POA: Diagnosis not present

## 2017-08-07 NOTE — Progress Notes (Signed)
Elmo Putt, please see addendum with new instructions. Thanks!

## 2017-08-07 NOTE — Assessment & Plan Note (Signed)
Frequent loose stools, complains of nocturnal stools. To be complete, we will check stool studies. Continue bentyl four times daily. Hold on days she has no BMs. Will determine when patient was last on metformin as current drug list provided by [facility] does not have it listed.

## 2017-08-07 NOTE — Progress Notes (Signed)
Please let patient know that per pharmacy she IS taking Bentyl. It should be in her packets to take one capsule four time daily before meals for diarrhea.    I want her to collect stool for diarrhea just to make sure she does not have infection. Stool needs to be dropped off at [place] beside Dr. Lance Sell office.   Please call pharmacy and find out when patient last filled metformin.   Please have patient increase pantoprazole to 40mg  BID (30 minutes before meals). #60, 1 refill. Please send in to [facility].   Return to the office in 4-6 weeks.

## 2017-08-07 NOTE — Patient Instructions (Signed)
1. I will check with [facility] to make sure you are on appropriate medications for your reflux and diarrhea. Further recommendations to follow.

## 2017-08-07 NOTE — Assessment & Plan Note (Signed)
Complains of N/V following coughing spells. Some upper abd discomfort. Aversion to eating. Heartburn constant. EGD up to date. Difficult historian. Will increase PPI to BID for refractory GERD, possible gastritis/PUD in setting of NSAID use. Will have her come back for follow up. If significant complaints of persistent symptoms would consider further work up.

## 2017-08-07 NOTE — Progress Notes (Signed)
Primary Care Physician: Kathyrn Drown, MD  Primary Gastroenterologist:  Garfield Cornea, MD   Chief Complaint  Patient presents with  . Abdominal Pain  . Diarrhea  . Nausea  . Emesis    HPI: [name] is a 57 y.o. female here for follow up. Last seen in 02/2017. EGD/TCS 09/2016.Colonoscopy for high risk colon cancer surveillance, personal history: Polyps. 4 polyps removed., Fragments of hyperplastic polyps. Advised to come back in 5 years. EGD for dysphagia status post empiric esophageal dilation. Exam was normal. BPE after last OV for reported persistent dysphagia, no esophageal stricture seen. Advised to follow up with dentist regarding poor fitting dentures.   H/O mixed IBS, GERD, dysphagia. Patient's medication list from [facility] reviewed. Appears she is no longer on zofran prn or metformin. I did not see any medications for diabetes. We will call and confirm when last refill went out.   She complains of numerous symptoms. Difficult to obtain history given her psychiatric disease and mental status. She reports vomiting associated with coughing. Also with pass loose stools with coughing fits. Been going on for couple of months. No weight loss. Doesn't feel like eating at times. Complains of nocturnal BMs. Some days no stools. Other days 3-4 loose stools. No melena, brbpr. Stomach hurts. Heartburn all the time. No dysphagia. Has not seen dentist and dentures cannot be worn. Denies using miralax lately. She doesn't believe she is on bentyl but it is on her med list.    EGD/TCS is up to date.   Current Outpatient Medications  Medication Sig Dispense Refill  . atorvastatin (LIPITOR) 10 MG tablet TAKE 1 TABLET BY MOUTH ONCE A DAY. 30 tablet 5  . benztropine (COGENTIN) 1 MG tablet Take 1 tablet (1 mg total) by mouth daily. 30 tablet 2  . calcium-vitamin D (OSCAL WITH D) 500-200 MG-UNIT per tablet Take 1 tablet by mouth 3 (three) times daily.      . diclofenac (VOLTAREN) 75 MG EC  tablet Take 1 tablet (75 mg total) by mouth 2 (two) times daily. 60 tablet 5  . dicyclomine (BENTYL) 10 MG capsule TAKE 1 CAPSULE BY MOUTH FOUR TIMES DAILY BEFORE MEALS AND AT BEDTIME FOR LOOSE STOOLS.HOLD FOR CONSTIPATION 120 capsule 1  . lamoTRIgine (LAMICTAL) 100 MG tablet Take 1 tablet (100 mg total) by mouth 2 (two) times daily. 60 tablet 2  . lisinopril (PRINIVIL,ZESTRIL) 2.5 MG tablet I qam 30 tablet 5  . paliperidone (INVEGA) 9 MG 24 hr tablet Take 1 tablet (9 mg total) by mouth daily. 30 tablet 2  . pantoprazole (PROTONIX) 40 MG tablet TAKE 1 TABLET BY MOUTH DAILY BEFORE BREAKFAST. 90 tablet 3  . polyethylene glycol (MIRALAX / GLYCOLAX) packet Take 17 g by mouth daily as needed for mild constipation. Reported on 01/27/2016 30 each 3  . potassium chloride (K-DUR) 10 MEQ tablet TAKE 1 TABLET BY MOUTH TWICE A DAY. 60 tablet 5  . potassium chloride (K-DUR) 10 MEQ tablet TAKE 1 TABLET BY MOUTH TWICE A DAY. 60 tablet 5  . propranolol (INDERAL) 10 MG tablet TAKE 1 TABLET BY MOUTH THREE TIMES A DAY. 90 tablet 5  . sertraline (ZOLOFT) 100 MG tablet Take 1 tablet (100 mg total) by mouth daily. 30 tablet 2  . torsemide (DEMADEX) 20 MG tablet TAKE 1 TABLETS BY MOUTH EACH MORNING. 30 tablet 5  . traMADol (ULTRAM) 50 MG tablet Take 1 tablet (50 mg total) by mouth every 8 (eight) hours as needed. (  Patient taking differently: Take 100 mg by mouth every 8 (eight) hours as needed. ) 15 tablet 0  . traZODone (DESYREL) 100 MG tablet Take 1 tablet (100 mg total) by mouth at bedtime. 30 tablet 2   No current facility-administered medications for this visit.     Allergies as of 08/07/2017 - Review Complete 08/07/2017  Allergen Reaction Noted  . Thorazine [chlorpromazine hcl] Anaphylaxis 10/20/2010  . Acetaminophen Other (See Comments)   . Aspirin Other (See Comments)   . Aspirin-acetaminophen-caffeine Other (See Comments)   . Nsaids Nausea And Vomiting   . Penicillins Nausea And Vomiting   . Tomato Rash  01/17/2012    ROS:  General: Negative for anorexia, weight loss, fever, chills, fatigue, weakness. ENT: Negative for hoarseness, difficulty swallowing , nasal congestion. CV: Negative for chest pain, angina, palpitations, dyspnea on exertion, peripheral edema.  Respiratory: Negative for dyspnea at rest, dyspnea on exertion, cough, sputum, wheezing.  GI: See history of present illness. GU:  Negative for dysuria, hematuria, urinary incontinence, urinary frequency, nocturnal urination.  Endo: Negative for unusual weight change.    Physical Examination:   BP 130/77   Pulse 76   Temp (!) 96.9 F (36.1 C) (Oral)   Ht 5' 10.5" (1.791 m)   Wt 261 lb 9.6 oz (118.7 kg)   BMI 37.01 kg/m   General: Well-nourished, well-developed in no acute distress.  Eyes: No icterus. Mouth: Oropharyngeal mucosa moist and pink , no lesions erythema or exudate. Lungs: Clear to auscultation bilaterally.  Heart: Regular rate and rhythm, no murmurs rubs or gallops.  Abdomen: Bowel sounds are normal, some mild upper abd tenderness, nondistended, no hepatosplenomegaly or masses, no abdominal bruits or hernia , no rebound or guarding.   Extremities: No lower extremity edema. No clubbing or deformities. Neuro: Alert and oriented x 4   Skin: Warm and dry, no jaundice.   Psych: Alert and cooperative, normal mood and affect.  Labs:  Lab Results  Component Value Date   CREATININE 1.04 (H) 12/26/2016   BUN 11 12/26/2016   NA 141 12/26/2016   K 4.4 12/26/2016   CL 100 12/26/2016   CO2 29 12/26/2016   Lab Results  Component Value Date   ALT 19 06/08/2017   AST 32 06/08/2017   ALKPHOS 111 06/08/2017   BILITOT 0.2 06/08/2017   Lab Results  Component Value Date   WBC 5.9 09/04/2016   HGB 12.8 09/04/2016   HCT 37.8 09/04/2016   MCV 92.2 09/04/2016   PLT 222 09/04/2016    Imaging Studies: Dg Lumbar Spine Complete  Result Date: 07/17/2017 CLINICAL DATA:  57 year old female with low back pain radiating  into both legs. No known injury. Initial encounter. EXAM: LUMBAR SPINE - COMPLETE 4+ VIEW COMPARISON:  01/27/2014 CT. FINDINGS: Minimal curvature lumbar spine. Minimal L3-4 facet degenerative changes.  No pars defect. Minimal disc space narrowing L3-4 through L5-S1. Vascular calcifications. IMPRESSION: Minimal disc space narrowing L3-4 through L5-S1. Minimal L3-4 facet degenerative changes. Trace vascular calcification. Electronically Signed   By: Genia Del M.D.   On: 07/17/2017 19:30

## 2017-08-08 ENCOUNTER — Other Ambulatory Visit: Payer: Self-pay

## 2017-08-08 MED ORDER — PANTOPRAZOLE SODIUM 40 MG PO TBEC: 40.0000 mg | DELAYED_RELEASE_TABLET | Freq: Two times a day (BID) | ORAL | 1 refills | Status: DC

## 2017-08-08 NOTE — Progress Notes (Signed)
Lmom, waiting on a return call.  

## 2017-08-09 DIAGNOSIS — M5431 Sciatica, right side: Secondary | ICD-10-CM | POA: Diagnosis not present

## 2017-08-09 DIAGNOSIS — R262 Difficulty in walking, not elsewhere classified: Secondary | ICD-10-CM | POA: Diagnosis not present

## 2017-08-09 DIAGNOSIS — M545 Low back pain: Secondary | ICD-10-CM | POA: Diagnosis not present

## 2017-08-13 DIAGNOSIS — M545 Low back pain: Secondary | ICD-10-CM | POA: Diagnosis not present

## 2017-08-13 DIAGNOSIS — R262 Difficulty in walking, not elsewhere classified: Secondary | ICD-10-CM | POA: Diagnosis not present

## 2017-08-13 DIAGNOSIS — Z9181 History of falling: Secondary | ICD-10-CM | POA: Diagnosis not present

## 2017-08-13 DIAGNOSIS — M5431 Sciatica, right side: Secondary | ICD-10-CM | POA: Diagnosis not present

## 2017-08-14 NOTE — Progress Notes (Signed)
LMOM, to return call.

## 2017-08-16 DIAGNOSIS — R262 Difficulty in walking, not elsewhere classified: Secondary | ICD-10-CM | POA: Diagnosis not present

## 2017-08-16 DIAGNOSIS — Z9181 History of falling: Secondary | ICD-10-CM | POA: Diagnosis not present

## 2017-08-16 DIAGNOSIS — M545 Low back pain: Secondary | ICD-10-CM | POA: Diagnosis not present

## 2017-08-16 DIAGNOSIS — M5431 Sciatica, right side: Secondary | ICD-10-CM | POA: Diagnosis not present

## 2017-08-20 ENCOUNTER — Other Ambulatory Visit: Payer: Self-pay | Admitting: Nurse Practitioner

## 2017-08-20 ENCOUNTER — Other Ambulatory Visit (HOSPITAL_COMMUNITY): Payer: Self-pay | Admitting: Psychiatry

## 2017-08-20 DIAGNOSIS — M5431 Sciatica, right side: Secondary | ICD-10-CM | POA: Diagnosis not present

## 2017-08-20 DIAGNOSIS — M545 Low back pain: Secondary | ICD-10-CM | POA: Diagnosis not present

## 2017-08-20 DIAGNOSIS — Z9181 History of falling: Secondary | ICD-10-CM | POA: Diagnosis not present

## 2017-08-20 DIAGNOSIS — R262 Difficulty in walking, not elsewhere classified: Secondary | ICD-10-CM | POA: Diagnosis not present

## 2017-08-20 NOTE — Progress Notes (Signed)
Patient difficult to get in touch with.  [name] please keep trying to get in touch with her.   I'm going to route for stacey to go ahead and make return visit in 4-6 weeks.

## 2017-08-21 ENCOUNTER — Encounter: Payer: Self-pay | Admitting: Internal Medicine

## 2017-08-21 NOTE — Progress Notes (Signed)
PATIENT SCHEDULED AND LETTER SENT  °

## 2017-08-21 NOTE — Progress Notes (Signed)
Appointment was made today as directed when pt was on the phone.

## 2017-08-22 DIAGNOSIS — Z9181 History of falling: Secondary | ICD-10-CM | POA: Diagnosis not present

## 2017-08-22 DIAGNOSIS — R262 Difficulty in walking, not elsewhere classified: Secondary | ICD-10-CM | POA: Diagnosis not present

## 2017-08-22 DIAGNOSIS — M545 Low back pain: Secondary | ICD-10-CM | POA: Diagnosis not present

## 2017-08-22 DIAGNOSIS — M5431 Sciatica, right side: Secondary | ICD-10-CM | POA: Diagnosis not present

## 2017-08-23 DIAGNOSIS — G4733 Obstructive sleep apnea (adult) (pediatric): Secondary | ICD-10-CM | POA: Diagnosis not present

## 2017-09-13 ENCOUNTER — Other Ambulatory Visit: Payer: Self-pay | Admitting: Family Medicine

## 2017-09-13 ENCOUNTER — Other Ambulatory Visit: Payer: Self-pay | Admitting: Gastroenterology

## 2017-09-19 ENCOUNTER — Encounter (HOSPITAL_COMMUNITY): Payer: Self-pay | Admitting: Psychiatry

## 2017-09-19 ENCOUNTER — Ambulatory Visit (INDEPENDENT_AMBULATORY_CARE_PROVIDER_SITE_OTHER): Payer: Medicare Other | Admitting: Psychiatry

## 2017-09-19 VITALS — BP 128/72 | HR 70 | Ht 70.0 in | Wt 262.0 lb

## 2017-09-19 DIAGNOSIS — M549 Dorsalgia, unspecified: Secondary | ICD-10-CM

## 2017-09-19 DIAGNOSIS — Z62810 Personal history of physical and sexual abuse in childhood: Secondary | ICD-10-CM | POA: Diagnosis not present

## 2017-09-19 DIAGNOSIS — F251 Schizoaffective disorder, depressive type: Secondary | ICD-10-CM

## 2017-09-19 DIAGNOSIS — Z736 Limitation of activities due to disability: Secondary | ICD-10-CM

## 2017-09-19 DIAGNOSIS — Z818 Family history of other mental and behavioral disorders: Secondary | ICD-10-CM | POA: Diagnosis not present

## 2017-09-19 DIAGNOSIS — Z560 Unemployment, unspecified: Secondary | ICD-10-CM | POA: Diagnosis not present

## 2017-09-19 DIAGNOSIS — G4700 Insomnia, unspecified: Secondary | ICD-10-CM | POA: Diagnosis not present

## 2017-09-19 DIAGNOSIS — F79 Unspecified intellectual disabilities: Secondary | ICD-10-CM

## 2017-09-19 MED ORDER — PALIPERIDONE ER 9 MG PO TB24: 9.0000 mg | ORAL_TABLET | Freq: Every day | ORAL | 2 refills | Status: DC

## 2017-09-19 MED ORDER — TRAZODONE HCL 150 MG PO TABS: 150.0000 mg | ORAL_TABLET | Freq: Every day | ORAL | 2 refills | Status: DC

## 2017-09-19 MED ORDER — SERTRALINE HCL 100 MG PO TABS: 100.0000 mg | ORAL_TABLET | Freq: Every day | ORAL | 2 refills | Status: DC

## 2017-09-19 MED ORDER — LAMOTRIGINE 100 MG PO TABS: 100.0000 mg | ORAL_TABLET | Freq: Two times a day (BID) | ORAL | 2 refills | Status: DC

## 2017-09-19 MED ORDER — BENZTROPINE MESYLATE 1 MG PO TABS
1.0000 mg | ORAL_TABLET | Freq: Every day | ORAL | 2 refills | Status: DC
Start: 2017-09-19 — End: 2017-12-20
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
## 2017-09-26 ENCOUNTER — Encounter: Payer: Self-pay | Admitting: Family Medicine

## 2017-09-26 ENCOUNTER — Ambulatory Visit (INDEPENDENT_AMBULATORY_CARE_PROVIDER_SITE_OTHER): Payer: Medicare Other | Admitting: Family Medicine

## 2017-09-26 VITALS — BP 136/84 | Ht 70.0 in | Wt 264.2 lb

## 2017-09-26 DIAGNOSIS — Z6841 Body Mass Index (BMI) 40.0 and over, adult: Secondary | ICD-10-CM

## 2017-09-26 DIAGNOSIS — E119 Type 2 diabetes mellitus without complications: Secondary | ICD-10-CM | POA: Diagnosis not present

## 2017-09-26 DIAGNOSIS — E1169 Type 2 diabetes mellitus with other specified complication: Secondary | ICD-10-CM | POA: Diagnosis not present

## 2017-09-26 DIAGNOSIS — I10 Essential (primary) hypertension: Secondary | ICD-10-CM

## 2017-09-26 DIAGNOSIS — E785 Hyperlipidemia, unspecified: Secondary | ICD-10-CM | POA: Diagnosis not present

## 2017-09-26 LAB — POCT GLYCOSYLATED HEMOGLOBIN (HGB A1C): Hemoglobin A1C: 5.7

## 2017-09-26 NOTE — Patient Instructions (Signed)
Steps to Quit Smoking Smoking tobacco can be bad for your health. It can also affect almost every organ in your body. Smoking puts you and people around you at risk for many serious long-lasting (chronic) diseases. Quitting smoking is hard, but it is one of the best things that you can do for your health. It is never too late to quit. What are the benefits of quitting smoking? When you quit smoking, you lower your risk for getting serious diseases and conditions. They can include:  Lung cancer or lung disease.  Heart disease.  Stroke.  Heart attack.  Not being able to have children (infertility).  Weak bones (osteoporosis) and broken bones (fractures).  If you have coughing, wheezing, and shortness of breath, those symptoms may get better when you quit. You may also get sick less often. If you are pregnant, quitting smoking can help to lower your chances of having a baby of low birth weight. What can I do to help me quit smoking? Talk with your doctor about what can help you quit smoking. Some things you can do (strategies) include:  Quitting smoking totally, instead of slowly cutting back how much you smoke over a period of time.  Going to in-person counseling. You are more likely to quit if you go to many counseling sessions.  Using resources and support systems, such as: ? Online chats with a counselor. ? Phone quitlines. ? Printed self-help materials. ? Support groups or group counseling. ? Text messaging programs. ? Mobile phone apps or applications.  Taking medicines. Some of these medicines may have nicotine in them. If you are pregnant or breastfeeding, do not take any medicines to quit smoking unless your doctor says it is okay. Talk with your doctor about counseling or other things that can help you.  Talk with your doctor about using more than one strategy at the same time, such as taking medicines while you are also going to in-person counseling. This can help make  quitting easier. What things can I do to make it easier to quit? Quitting smoking might feel very hard at first, but there is a lot that you can do to make it easier. Take these steps:  Talk to your family and friends. Ask them to support and encourage you.  Call phone quitlines, reach out to support groups, or work with a counselor.  Ask people who smoke to not smoke around you.  Avoid places that make you want (trigger) to smoke, such as: ? Bars. ? Parties. ? Smoke-break areas at work.  Spend time with people who do not smoke.  Lower the stress in your life. Stress can make you want to smoke. Try these things to help your stress: ? Getting regular exercise. ? Deep-breathing exercises. ? Yoga. ? Meditating. ? Doing a body scan. To do this, close your eyes, focus on one area of your body at a time from head to toe, and notice which parts of your body are tense. Try to relax the muscles in those areas.  Download or buy apps on your mobile phone or tablet that can help you stick to your quit plan. There are many free apps, such as QuitGuide from the CDC (Centers for Disease Control and Prevention). You can find more support from smokefree.gov and other websites.  This information is not intended to replace advice given to you by your health care provider. Make sure you discuss any questions you have with your health care provider. Document Released: 04/22/2009 Document   Revised: 02/22/2016 Document Reviewed: 11/10/2014 Elsevier Interactive Patient Education  2018 [employer]  

## 2017-09-26 NOTE — Progress Notes (Signed)
   Subjective:    Patient ID: [name], female    DOB: 1960-12-09, 57 y.o.   MRN: [id number]  Diabetes  She presents for her follow-up diabetic visit. She has type 2 diabetes mellitus. Pertinent negatives for hypoglycemia include no confusion. Associated symptoms include blurred vision. Pertinent negatives for diabetes include no chest pain, no fatigue, no polydipsia, no polyphagia and no weakness. She has not had a previous visit with a dietitian. She rarely participates in exercise. She sees a podiatrist.Eye exam is current.    Patient for blood pressure check up. Patient relates compliance with meds. Todays BP reviewed with the patient. Patient denies issues with medication. Patient relates reasonable diet. Patient tries to minimize salt. Patient aware of BP goals.   Patient here for follow-up regarding cholesterol.  Patient does try to maintain a reasonable diet.  Patient does take the medication on a regular basis.  Denies missing a dose.  The patient denies any obvious side effects.  Prior blood work results reviewed with the patient.  The patient is aware of his cholesterol goals and the need to keep it under good control to lessen the risk of disease.   The patient's BMI is calculated.  It is in the vital signs and acknowledged.  It is above the recommended BMI for the patient's height and weight.  The patient has been counseled regarding healthy diet, restricted portions, avoiding excessive carbohydrates/sugary foods, and increase physical activity as health permits.  It is in the patient's best interest to lower the risk of secondary illness including heart disease strokes and cancer by losing weight.  The patient acknowledges this information.  Results for orders placed or performed in visit on 09/26/17  POCT HgB A1C  Result Value Ref Range   Hemoglobin A1C 5.7    Review of Systems  Constitutional: Negative for activity change, appetite change and fatigue.  HENT: Negative for  congestion.   Eyes: Positive for blurred vision.  Respiratory: Negative for cough.   Cardiovascular: Negative for chest pain.  Gastrointestinal: Negative for abdominal pain.  Endocrine: Negative for polydipsia and polyphagia.  Skin: Negative for color change.  Neurological: Negative for weakness.  Psychiatric/Behavioral: Negative for confusion.       Objective:   Physical Exam  Constitutional: She appears well-developed and well-nourished. No distress.  HENT:  Head: Normocephalic and atraumatic.  Eyes: Right eye exhibits no discharge. Left eye exhibits no discharge.  Neck: No tracheal deviation present.  Cardiovascular: Normal rate, regular rhythm and normal heart sounds.  No murmur heard. Pulmonary/Chest: Effort normal and breath sounds normal. No respiratory distress. She has no wheezes. She has no rales.  Musculoskeletal: She exhibits no edema.  Lymphadenopathy:    She has no cervical adenopathy.  Neurological: She is alert. She exhibits normal muscle tone.  Skin: Skin is warm and dry. No erythema.  Psychiatric: Her behavior is normal.  Vitals reviewed.  There are no foot ulcers patient does see podiatry on a regular basis       Assessment & Plan:  Morbid obesity Patient control regular activity decreasing carbohydrates discussed  Diabetes under good control with diet alone  Hyperlipidemia previous labs reviewed continue current medication  Blood pressure good control  Follow-up in 4 months comprehensive lab work before that visit

## 2017-10-02 ENCOUNTER — Ambulatory Visit: Payer: Self-pay | Admitting: Gastroenterology

## 2017-10-04 ENCOUNTER — Ambulatory Visit
Admission: RE | Admit: 2017-10-04 | Discharge: 2017-10-04 | Disposition: A | Discharge status: Home / Self Care | Payer: Medicare Other | Source: Ambulatory Visit | Attending: Cardiothoracic Surgery | Admitting: Cardiothoracic Surgery

## 2017-10-04 ENCOUNTER — Other Ambulatory Visit: Payer: Self-pay

## 2017-10-04 ENCOUNTER — Ambulatory Visit (INDEPENDENT_AMBULATORY_CARE_PROVIDER_SITE_OTHER): Payer: Medicare Other | Admitting: Cardiothoracic Surgery

## 2017-10-04 ENCOUNTER — Encounter: Payer: Self-pay | Admitting: Cardiothoracic Surgery

## 2017-10-04 VITALS — BP 116/78 | HR 86 | Resp 18 | Ht 70.0 in | Wt 270.6 lb

## 2017-10-04 DIAGNOSIS — R911 Solitary pulmonary nodule: Secondary | ICD-10-CM

## 2017-10-04 DIAGNOSIS — Z712 Person consulting for explanation of examination or test findings: Secondary | ICD-10-CM

## 2017-10-04 NOTE — Progress Notes (Signed)
[address]       [place] [address]             [phone]       Judith D Mckinney [place] [facility] Medical Record #[id number] Date of Birth: 1960/08/20  Referring: Kathyrn Drown, MD Primary Care: Kathyrn Drown, MD  Chief Complaint:    Lung Nodule   History of Present Illness:  Patient comes in today with a follow-up CT scan.  She has been followed with serial CT scans for a incidental right middle lobe lung nodule found in July 2013   She is a long-term smoker since age 31 and to a significant degree since age 56, she notes that she smokes a cigarette every 4 hours. She was first noted in July 2013 to have a small incidental right middle lobe lung nodule, she has had serial CT scans to follow this nodule. The location makes it difficult to biopsy.    She's had no history of tuberculosis or known tuberculosis exposure.  She does give a history of having a myocardial infarction in being hospitalized for 5 days at age 57 no other details about this revealed. She does note that at times especially with exertion she feels a pounding heart and chest discomfort with exertional shortness of breath. She denies resting shortness of breath.  She does give a history of having a right-sided stroke in the past.     Current Activity/ Functional Status: Patient  is independent with mobility/ambulation, transfers, ADL's, IADL's. T     Past Medical History  Diagnosis Date  . Mental retardation   . Hyperglycemia   . HTN (hypertension)   . Migraines   . Anxiety   . IBS (irritable bowel syndrome)   . Diabetes mellitus   . Lung nodules     right, followed by PCP, PET 11/2011    Past Surgical History:  Procedure Laterality Date  . ABDOMINAL HYSTERECTOMY    . COLONOSCOPY  11/2007   hyperplastic polyps, prior hx of adenomas   . COLONOSCOPY  05/2010   incomplete due to poor prep, hyperplastic rectal polyp  . COLONOSCOPY  05/05/2002   Dimunitive polyps in the rectum  and left colon, cold    biopsied/removed.  Scattered few left-sided diverticula.  Regular colonic   mucosa appeared normal  . COLONOSCOPY N/A 05/28/2013   Rourk: mulitple tubular adenomas removed. next tcs 05/2016  . COLONOSCOPY WITH PROPOFOL N/A 09/07/2016   Dr. Gala Romney: For hyperplastic polyps removed. Next colonoscopy March 2023 given history of adenomatous colon polyps in the past.  . ESOPHAGOGASTRODUODENOSCOPY  08/2007   moderate sized hiatal hernia  . ESOPHAGOGASTRODUODENOSCOPY  05/2010   noncritical schatzki ring s/p 92F  . ESOPHAGOGASTRODUODENOSCOPY (EGD) WITH ESOPHAGEAL DILATION N/A 02/06/2013   NKN:[id number] esophagus-s/p dilation up to a 56 [place] size with [facility] dilators.  Hiatal hernia  . ESOPHAGOGASTRODUODENOSCOPY (EGD) WITH PROPOFOL N/A 09/07/2016   Dr. Gala Romney: Normal, status post empiric dilation of the esophagus for history of dysphagia  . EXTERNAL EAR SURGERY     bilateral  . FOOT SURGERY    . GLAUCOMA SURGERY    . MALONEY DILATION N/A 09/07/2016   Procedure: Venia Minks DILATION;  Surgeon: Daneil Dolin, MD;  Location: [facility];  Service: Endoscopy;  Laterality: N/A;  . POLYPECTOMY  09/07/2016   Procedure: POLYPECTOMY;  Surgeon: Daneil Dolin, MD;  Location: [facility];  Service: Endoscopy;;  sigmoid colon x4  . small bowel  capsule  10/2007   normal    Family History  Problem Relation Age of Onset  . Stroke Mother   . Heart attack Father   . Schizophrenia Other   . Drug abuse Other   . Alcohol abuse Other   . Colon cancer Unknown        aunt  . Obesity Unknown   . COPD Unknown   . GER disease Unknown   . Diabetes type II Unknown   . Anxiety disorder Unknown   . Depression Unknown   . Depression Sister   . Schizophrenia Sister   . Liver disease Neg Hx   . Inflammatory bowel disease Neg Hx       Social History   Tobacco Use  Smoking Status Current Every Day Smoker  . Packs/day: 0.25  . Years: 40.00  . Pack years: 10.00  . Types: Cigarettes  Smokeless  Tobacco Never Used    History  Alcohol Use No   Says does not drink alcohol      Allergies  Allergen Reactions  . Thorazine [Chlorpromazine Hcl] Anaphylaxis  . Acetaminophen Other (See Comments)    Makes pt dizzy  . Aspirin Other (See Comments)    seizure  . Aspirin-Acetaminophen-Caffeine Other (See Comments)    seizure  . Nsaids Nausea And Vomiting  . Penicillins Nausea And Vomiting    Has patient had a PCN reaction causing immediate rash, facial/tongue/throat swelling, SOB or lightheadedness with hypotension:Yes Has patient had a PCN reaction causing severe rash involving mucus membranes or skin necrosis:Yes Has patient had a PCN reaction that required hospitalization:Yes Has patient had a PCN reaction occurring within the last 10 years:No If all of the above answers are "NO", then may proceed with Cephalosporin use.   . Tomato Rash    Current Outpatient Medications  Medication Sig Dispense Refill  . atorvastatin (LIPITOR) 10 MG tablet TAKE 1 TABLET BY MOUTH ONCE A DAY. 30 tablet 5  . benztropine (COGENTIN) 1 MG tablet Take 1 tablet (1 mg total) by mouth daily. 30 tablet 2  . calcium-vitamin D (OSCAL WITH D) 500-200 MG-UNIT per tablet Take 1 tablet by mouth 3 (three) times daily.      . diclofenac (VOLTAREN) 75 MG EC tablet Take 1 tablet (75 mg total) by mouth 2 (two) times daily. 60 tablet 5  . dicyclomine (BENTYL) 10 MG capsule TAKE 1 CAPSULE BY MOUTH FOUR TIMES DAILY BEFORE MEALS AND AT BEDTIME FOR LOOSE STOOLS.HOLD FOR CONSTIPATION 120 capsule 2  . lamoTRIgine (LAMICTAL) 100 MG tablet Take 1 tablet (100 mg total) by mouth 2 (two) times daily. 60 tablet 2  . lisinopril (PRINIVIL,ZESTRIL) 2.5 MG tablet TAKE (1) TABLET BY MOUTH EACH MORNING. 30 tablet 0  . paliperidone (INVEGA) 9 MG 24 hr tablet Take 1 tablet (9 mg total) by mouth daily. 30 tablet 2  . pantoprazole (PROTONIX) 40 MG tablet Take 1 tablet (40 mg total) by mouth 2 (two) times daily. Take 1 tablet (40mg  total) by  mouth 2(two) times daily 30 mins before meals 60 tablet 1  . polyethylene glycol (MIRALAX / GLYCOLAX) packet Take 17 g by mouth daily as needed for mild constipation. Reported on 01/27/2016 30 each 3  . potassium chloride (K-DUR) 10 MEQ tablet TAKE 1 TABLET BY MOUTH TWICE A DAY. 60 tablet 5  . potassium chloride (K-DUR) 10 MEQ tablet TAKE 1 TABLET BY MOUTH TWICE A DAY. 60 tablet 5  . propranolol (INDERAL) 10 MG tablet TAKE 1 TABLET BY  MOUTH THREE TIMES A DAY. 90 tablet 5  . sertraline (ZOLOFT) 100 MG tablet Take 1 tablet (100 mg total) by mouth daily. 30 tablet 2  . torsemide (DEMADEX) 20 MG tablet TAKE 1 TABLETS BY MOUTH EACH MORNING. 30 tablet 5  . torsemide (DEMADEX) 20 MG tablet TAKE (1) TABLET BY MOUTH EACH MORNING. 30 tablet 0  . traZODone (DESYREL) 150 MG tablet Take 1 tablet (150 mg total) by mouth at bedtime. 30 tablet 2   No current facility-administered medications for this visit.        Review of Systems:  Physical Exam: .BP 116/78 (BP Location: Left Arm, Patient Position: Sitting, Cuff Size: Large)   Pulse 86   Resp 18   Ht 5\' 10"  (1.778 m)   Wt 270 lb 9.6 oz (122.7 kg)   SpO2 93% Comment: RA  BMI 38.83 kg/m   General appearance: alert, cooperative, appears older than stated age and no distress Head: Normocephalic, without obvious abnormality, atraumatic Neck: no adenopathy, no carotid bruit, no JVD, supple, symmetrical, trachea midline and thyroid not enlarged, symmetric, no tenderness/mass/nodules Lymph nodes: Cervical, supraclavicular, and axillary nodes normal. Resp: clear to auscultation bilaterally Back: symmetric, no curvature. ROM normal. No CVA tenderness. Cardio: regular rate and rhythm, S1, S2 normal, no murmur, click, rub or gallop GI: soft, non-tender; bowel sounds normal; no masses,  no organomegaly Extremities: extremities normal, atraumatic, no cyanosis or edema and Homans sign is negative, no sign of DVT Neurologic: Grossly normal, but patient is  mentally slow   Diagnostic Studies & Laboratory data:     Recent Radiology Findings:  Ct Chest Wo Contrast  Result Date: 10/04/2017 CLINICAL DATA:  Followup pulmonary nodule. EXAM: CT CHEST WITHOUT CONTRAST TECHNIQUE: Multidetector CT imaging of the chest was performed following the standard protocol without IV contrast. COMPARISON:  12/21/2016, PET-CT 11/29/2011, CT chest 11/01/2011 FINDINGS: Cardiovascular: The heart size appears within normal limits. No pericardial effusion identified. Aortic atherosclerosis. Calcification in the LAD coronary artery identified. Mediastinum/Nodes: No enlarged mediastinal or axillary lymph nodes. Thyroid gland, trachea, and esophagus demonstrate no significant findings. Lungs/Pleura: No pleural effusion or airspace consolidation. The slowly growing nodule within the right middle lobe is again identified. This has been present on studies dating back to 11/01/2011. Currently this measures 1.6 x 1.0 by 2.1 cm (volume = 1.8 cm^3), image 87/5 and 67/3. On the most recent previous exam this measured 1.8 x 1.0 by 2.0 cm (volume = 1.9 cm^3). Allowing for differences in technique this is not significantly changed. On study from 11/01/2011 this nodule measured 1.0 x 0.9 by 1.8 cm (volume = 0.8 cm^3). Upper Abdomen: No acute abnormality. Musculoskeletal: Mild degenerative disc disease identified within the lower thoracic spine. IMPRESSION: 1. When compared with the most recent previous exam the right middle lobe nodule is not significantly changed in the interval. When compared with the most remote comparison exam from 2013 the nodule has doubled in size. This remains indeterminate. A slow-growing indolent neoplastic process cannot be excluded. Mild FDG uptake was associated with this nodule on study from 11/30/2011 with an SUV max of 2.5. 2. Aortic Atherosclerosis (ICD10-I70.0). 3. LAD coronary artery calcifications. Electronically Signed   By: Kerby Moors M.D.   On: 10/04/2017  16:42   I have independently reviewed the above radiology studies  and reviewed the findings with the patient.    Ct Chest Wo Contrast  Result Date: 12/21/2016 CLINICAL DATA:  Followup of right-sided pulmonary nodule. EXAM: CT CHEST WITHOUT CONTRAST TECHNIQUE: Multidetector  CT imaging of the chest was performed following the standard protocol without IV contrast. COMPARISON:  04/20/2016 FINDINGS: Cardiovascular: Aortic and branch vessel atherosclerosis. Borderline cardiomegaly, without pericardial effusion. Lad coronary artery atherosclerosis. Mediastinum/Nodes: Small bilateral axillary nodes are unchanged and not pathologic by size criteria. No mediastinal or definite hilar adenopathy, given limitations of unenhanced CT. Lungs/Pleura: No pleural fluid. Mild motion degradation inferiorly. Right middle lobe pulmonary nodule is in the region of motion degradation. 1.8 x 1.0 cm on image 62/series 4. Compare 1.6 x 1.0 cm on the prior exam. 2.0 cm craniocaudal on image 61 coronal, similar. No new pulmonary nodule identified. Upper Abdomen: Moderate hepatic steatosis with sparing in the posterior aspect of segment 4. Normal imaged portions of the spleen, stomach, pancreas, adrenal glands, kidneys. Musculoskeletal: No acute osseous abnormality. IMPRESSION: 1. Mild motion degradation, including through the nodule of concern in the right middle lobe. 2. Given this limitation, the right middle lobe nodules is felt to be similar over 6 months. Consider repeat exam at 3-6 months, given today's limitations. 3. Age advanced coronary artery atherosclerosis. Recommend assessment of coronary risk factors and consideration of medical therapy. 4.  Aortic Atherosclerosis (ICD10-I70.0). 5. Hepatic steatosis. Electronically Signed   By: Abigail Miyamoto M.D.   On: 12/21/2016 11:41    I have independently reviewed the above radiology studies  and reviewed the findings with the patient.  Ct Chest Wo Contrast  Result Date:  04/20/2016 CLINICAL DATA:  Followup pulmonary nodule EXAM: CT CHEST WITHOUT CONTRAST TECHNIQUE: Multidetector CT imaging of the chest was performed following the standard protocol without IV contrast. COMPARISON:  02/11/2015 FINDINGS: Cardiovascular: Aortic atherosclerosis. Calcification in the LAD coronary artery noted. Normal heart size. No pericardial effusion. Mediastinum/Nodes: No enlarged mediastinal or axillary lymph nodes. Thyroid gland, trachea, and esophagus demonstrate no significant findings. Lungs/Pleura: No pleural effusion. Mild changes of emphysema. Diffuse bronchial wall thickening noted. The nodule of concern within the right middle lobe measures 1.6 by 1.0 cm, image 69 of series 4. Mean diameter is equal to 1.3 cm. On the study from 2014 this had a mean diameter of 9 mm. No new pulmonary nodules identified. Upper Abdomen: No acute abnormality.The adrenal glands appear normal. Mild hepatic steatosis. Musculoskeletal: No chest wall mass or suspicious bone lesions identified. IMPRESSION: 1. The nodule within the right middle lobe has a mean diameter 13 mm on today's study. This is compared with 9 mm in 2014. Although this has a nonaggressive appearance and is somewhat circumscribed given the interval growth a PET-CT is advised. 2. Aortic atherosclerosis.  LAD coronary artery calcification noted. Electronically Signed   By: Kerby Moors M.D.   On: 04/20/2016 11:33   Ct Chest Wo Contrast  02/11/2015   CLINICAL DATA:  Follow-up lung nodule. Mid left chest pain and productive cough. History of right breast cancer.  EXAM: CT CHEST WITHOUT CONTRAST  TECHNIQUE: Multidetector CT imaging of the chest was performed following the standard protocol without IV contrast.  COMPARISON:  Chest CT 01/27/2014. ; chest CT 01/15/2014 ; chest CT 11/01/2011.  FINDINGS: Mediastinum/Nodes: Visualized thyroid is unremarkable. No enlarged axillary, mediastinal or hilar lymphadenopathy. Normal heart size.  Lungs/Pleura:  Central airways are patent. No consolidative pulmonary opacities. There is an 11 x 8 mm well-circumscribed nodule within the medial segment of the right middle lobe (image 38; series 4). This is unchanged from prior exam 01/27/2014 and minimally increased in size from CT 11/01/2011. No other suspicious appearing pulmonary nodules or masses. No pleural effusion or pneumothorax.  Upper abdomen: Hepatic steatosis.  Musculoskeletal: No aggressive or acute appearing osseous lesions.  IMPRESSION: No significant interval change in size of oval circumscribed nodule within the medial segment right middle lobe. Given stability over time, this is favored to represent a benign process. As discussed on prior CT, an additional followup chest CT in 1-2 years is recommended to ensure continued stability.   Electronically Signed   By: Lovey Newcomer M.D.   On: 02/11/2015 11:10   Ct Chest Wo Contrast  01/15/2014   CLINICAL DATA:  Shortness of breath. Cough. Hemoptysis. History of smoking. Followup evaluation of lung nodule.  EXAM: CT CHEST WITHOUT CONTRAST  TECHNIQUE: Multidetector CT imaging of the chest was performed following the standard protocol without IV contrast.  COMPARISON:  Chest CT 01/02/2013.  FINDINGS: Mediastinum: Heart size is normal. There is no significant pericardial fluid, thickening or pericardial calcification. There is atherosclerosis of the thoracic aorta, the great vessels of the mediastinum and the coronary arteries, including calcified atherosclerotic plaque in the left anterior descending coronary artery. No pathologically enlarged mediastinal or hilar lymph nodes. Please note that accurate exclusion of hilar adenopathy is limited on noncontrast CT scans. Esophagus is unremarkable in appearance.  Lungs/Pleura: 12 x 7 mm well-circumscribed nodule in the mid medial segment of the right middle lobe (image 38 of series 4) is only slightly larger than remote prior study 11/01/2011 at which point it measured 11 x  7 mm when measured in a similar fashion on image 34 of series 3 of that examination. No other suspicious appearing pulmonary nodules or masses. No acute consolidative airspace disease. No pleural effusions.  Upper Abdomen: Unremarkable.  Musculoskeletal: There are no aggressive appearing lytic or blastic lesions noted in the visualized portions of the skeleton.  IMPRESSION: 1. 12 x 7 mm well-circumscribed nodule in the mid medial segment of the right middle lobe (image 38 of series 4) is only slightly larger than remote prior study 11/01/2011 at which point it measured 11 x 7 mm when measured in a similar fashion on image 34 of series 3 of that examination. Given the relative stability over this 2 year time interval, this is strongly favored to represent a benign lesion, potentially a hammartoma (some hammartomas contain brown fat, which may account for low-level metabolic activity on the prior PET-CT). Another differential consideration would be in a low-grade carcinoid tumor. An additional noncontrast chest CT in 1-2 years is suggested to ensure continued stability.   Electronically Signed   By: Vinnie Langton M.D.   On: 01/15/2014 13:24  Ct Chest Wo Contrast  01/02/2013   *RADIOLOGY REPORT*  Clinical Data: Right lung nodule.  History of breast cancer.  CT CHEST WITHOUT CONTRAST  Technique:  Multidetector CT imaging of the chest was performed following the standard protocol without IV contrast.  Comparison: Chest CT 04/22/2012.  Findings: The right middle lobe pulmonary nodule is unchanged. Measures 10.7 x 6.9 mm and previous measure 10.3 x 7.4 mm.  No new nodules are identified.  No acute pulmonary findings.  No pleural effusion.  The heart is normal in size.  No pericardial effusion.  No mediastinal or hilar lymphadenopathy.  Small scattered lymph nodes are stable.  The aorta is normal in caliber.  The esophagus is grossly normal.  The chest wall is unremarkable.  Stable calcifications of the right breast.   No supraclavicular or axillary adenopathy.  Stable bilateral axillary lymph nodes.  The thyroid gland is normal.  The bony thorax is intact.  IMPRESSION:  Stable right middle lobe pulmonary nodule.  Recommend follow-up noncontrast chest CT in 1 year to document stability.   Original Report Authenticated By: Marijo Sanes, M.D.     Ct Chest W Contrast  04/22/2012  *RADIOLOGY REPORT*  Clinical Data: Follow-up pulmonary nodule.  CT CHEST WITH CONTRAST  Technique:  Multidetector CT imaging of the chest was performed following the standard protocol during bolus administration of intravenous contrast.  Contrast: 80mL OMNIPAQUE IOHEXOL 300 MG/ML  SOLN  Comparison: PET 11/29/2011 and CT chest 11/01/2011.  Findings: No pathologically enlarged mediastinal, hilar or axillary lymph nodes.  Heart size normal.  No pericardial effusion.  A nodule in the right middle lobe measures 10 x 7 mm, stable from 11/01/2011 when remeasured.  A tiny subpleural nodular density in the right lower lobe measures 3 mm (image 42), stable.  No pleural fluid.  Airway is unremarkable.  Incidental imaging of the upper abdomen shows low attenuation throughout the visualized portion of the liver.  No worrisome lytic or sclerotic lesions.  IMPRESSION: Right middle lobe nodule is unchanged from 11/01/2011.  Given mild hypermetabolism on 11/29/2011, a follow-up interval of 6 months is recommended to confirm continued stability.   Original Report Authenticated By: Luretha Rued, M.D.      11/09/2011 RADIOLOGY REPORT*  Clinical Data: Right basilar pulmonary nodule on abdominal CT.  Dysphagia. The patient has diabetes mellitus and unspecified  mental retardation.  CT CHEST WITH CONTRAST  Technique: Multidetector CT imaging of the chest was performed  following the standard protocol during bolus administration of  intravenous contrast.  Contrast: 15mL OMNIPAQUE IOHEXOL 300 MG/ML SOLN  Comparison: Abdominal CTs 10/10/2011 and 01/19/2005.  Chest  radiographs 10/23/2001.  Findings: As demonstrated on the recent abdominal CT, there is a  lobulated well circumscribed 10 x 9 mm right middle lobe nodule on  image 35. This is noncalcified. This area of the lung bases was  not imaged on the 2006 CT. No other pulmonary nodules are  identified. There is no airspace disease or confluent airspace  opacity.  There are no enlarged mediastinal or hilar lymph nodes. There is  no pleural or pericardial effusion. Minimal aortic atherosclerosis  is noted.  Images through the upper abdomen demonstrate diffuse hepatic  steatosis. There is no adrenal mass.  IMPRESSION:  1. Lobulated noncalcified 1-cm right middle lobe nodule was not  imaged on prior abdominal CT. This nodule is not conclusively  demonstrated on radiographs such that stability cannot be  addressed.  2. This nodule is nonspecific and could reflect a hamartoma or  other benign lesion. However, malignancy cannot be excluded.  3. No other nodules, adenopathy or acute findings.  This nodule would be difficult to biopsy given its close proximity  to the heart. Management options include PET CT to assess for  hypermetabolic activity and follow-up CT (depending on risk factors  for malignancy). If follow-up is elected, follow-up chest CT at 3  months is recommended. This recommendation is adapted from the  consensus statement: Guidelines for Management of Small Pulmonary  Nodules Detected on CT Scans: A Statement from the [place] as published in Radiology 2005; 237:395-400.  Original Report Authenticated By: Vivia Ewing, M.D.  Halina Andreas, MD Tue Jan 09, 2012 2:52:39 PM EDT       **ADDENDUM** CREATED: 01/09/2012 14:43:25  Voice recognition errors:  Within the Findings section, the second sentence of the CHEST  section should read: "NO additional hypermetabolic nodules are  present."  Within the first IMPRESSION, the last phrase of the first sentence    should read: "for a nodule this SIZE."  Findings discussed with Dr. Servando Snare on 07/ 08/2011  **END ADDENDUM** SIGNED BY: Suzy Bouchard, M.D.      Study Result     *RADIOLOGY REPORT*  Clinical Data: Recent imaging demonstrates a solitary pulmonary  nodule. FDG PET CT requested to evaluate for possible malignancy.  NUCLEAR MEDICINE PET SKULL BASE TO THIGH  Fasting Blood Glucose: 113  Technique: 18.4 mCi F-18 FDG was injected intravenously via the  right antecubital fossa. CT data was obtained and used for  attenuation correction and anatomic localization only. (This was  not acquired as a diagnostic CT examination.) Additional exam  technical data entered on technologist worksheet.  Comparison: CT 11/01/2011  Findings:  Head/Neck: No hypermetabolic lymph nodes in the neck.  Chest: Within the right middle lobe there is a 8 mm pulmonary  nodule adjacent to the right atrium with mild metabolic activity (  SUV max = 2.5). Additional hypermetabolic nodules are present. No  hypermetabolic mediastinal lymph nodes.  There is a single hypermetabolic right axillary lymph node which is  normal pathology. There is misregistration in the right axilla due  to patient motion.  Abdomen/Pelvis: No abnormal hypermetabolic activity within the  liver, pancreas, adrenal glands, or spleen. No hypermetabolic  lymph nodes in the abdomen or pelvis.  Skeleton: No focal hypermetabolic activity to suggest skeletal  metastasis. Sclerosis of the left SI joint appears benign.  IMPRESSION:  1. Mildly hypermetabolic nodule within the right middle lobe.  Although the activity is mild, for a nodule this side this is  concerning although nonspecific. If biopsy is not feasible due to  location, recommend short-term follow-up CT with contrast in 1 to  3 months.  2. No evidence of metastasis.  3. Hypermetabolic right axial lymph node is likely reactive.  Original Report Authenticated By: Suzy Bouchard, M.D.        Recent Lab Findings: Lab Results  Component Value Date   WBC 5.9 09/04/2016   HGB 12.8 09/04/2016   HCT 37.8 09/04/2016   PLT 222 09/04/2016   GLUCOSE 95 12/26/2016   CHOL 121 06/08/2017   TRIG 108 06/08/2017   HDL 50 06/08/2017   LDLCALC 49 06/08/2017   ALT 19 06/08/2017   AST 32 06/08/2017   NA 141 12/26/2016   K 4.4 12/26/2016   CL 100 12/26/2016   CREATININE 1.04 (H) 12/26/2016   BUN 11 12/26/2016   CO2 29 12/26/2016   HGBA1C 5.7 09/26/2017      Assessment / Plan:   Right middle lobe lung nodule- very slow growing over 5 years  possible hamartoma. Over 5 years volume has doubled , little change since last ct .  With the patient's ongoing smoking, and  poor surgical candidate will continue to follow , will obtain a CT of the chest in 12 months. The location near the pericardium would increase complexity to biopsy and or use stereotactic radiotherapy.    I have again reviewed with her the need to stop smoking    Grace Isaac MD  [facility] [phone] 10/04/2017 7:24 PM

## 2017-10-05 ENCOUNTER — Encounter: Payer: Self-pay | Admitting: Gastroenterology

## 2017-10-05 ENCOUNTER — Ambulatory Visit (INDEPENDENT_AMBULATORY_CARE_PROVIDER_SITE_OTHER): Payer: Medicare Other | Admitting: Gastroenterology

## 2017-10-05 VITALS — BP 128/73 | HR 75 | Temp 97.4°F | Ht 70.5 in | Wt 271.2 lb

## 2017-10-05 DIAGNOSIS — R197 Diarrhea, unspecified: Secondary | ICD-10-CM

## 2017-10-05 NOTE — Progress Notes (Signed)
CC'D TO PCP °

## 2017-10-05 NOTE — Patient Instructions (Signed)
1. Please collect stool specimen and drop off at lab.

## 2017-10-05 NOTE — Progress Notes (Signed)
Primary Care Physician: Kathyrn Drown, MD  Primary Gastroenterologist:  Garfield Cornea, MD   Chief Complaint  Patient presents with  . Abdominal Pain    "cramps"  . Diarrhea  . Emesis    HPI: [name] is a 57 y.o. female here for follow-up visit.  Last seen in January.EGD/TCS 09/2016.Colonoscopy for high risk colon cancer surveillance, personal history: Polyps. 4 polyps removed., Fragments of hyperplastic polyps. Advised to come back in 5 years. EGD for dysphagia status post empiric esophageal dilation.Exam was normal. BPE after last OV for reported persistent dysphagia, no esophageal stricture seen. Advised to follow up with dentist regarding poor fitting dentures.   She has a history of mixed IBS, GERD, dysphagia.  After last office visit we increased her Protonix to twice daily.  We ordered stool test but she did not complete them.  It appears that the social worker was never contacted.Her weight is up 10 pounds from January.  Nocturnal diarrhea. BM 8-9 daily. No melena. Occasional brbpr. Cramps prior to bowel movement but improved afterwards.  Heartburn off and on.  On PPI twice daily.  No dysphagia.  Continues to have coughing spells and coughs up white phlegm.  No vomiting.  No shortness of breath.  Continues to smoke.  Current Outpatient Medications  Medication Sig Dispense Refill  . atorvastatin (LIPITOR) 10 MG tablet TAKE 1 TABLET BY MOUTH ONCE A DAY. 30 tablet 5  . benztropine (COGENTIN) 1 MG tablet Take 1 tablet (1 mg total) by mouth daily. 30 tablet 2  . calcium-vitamin D (OSCAL WITH D) 500-200 MG-UNIT per tablet Take 1 tablet by mouth 3 (three) times daily.      . diclofenac (VOLTAREN) 75 MG EC tablet Take 1 tablet (75 mg total) by mouth 2 (two) times daily. 60 tablet 5  . dicyclomine (BENTYL) 10 MG capsule TAKE 1 CAPSULE BY MOUTH FOUR TIMES DAILY BEFORE MEALS AND AT BEDTIME FOR LOOSE STOOLS.HOLD FOR CONSTIPATION 120 capsule 2  . lamoTRIgine (LAMICTAL) 100 MG  tablet Take 1 tablet (100 mg total) by mouth 2 (two) times daily. 60 tablet 2  . lisinopril (PRINIVIL,ZESTRIL) 2.5 MG tablet TAKE (1) TABLET BY MOUTH EACH MORNING. 30 tablet 0  . paliperidone (INVEGA) 9 MG 24 hr tablet Take 1 tablet (9 mg total) by mouth daily. 30 tablet 2  . pantoprazole (PROTONIX) 40 MG tablet Take 1 tablet (40 mg total) by mouth 2 (two) times daily. Take 1 tablet (40mg  total) by mouth 2(two) times daily 30 mins before meals 60 tablet 1  . polyethylene glycol (MIRALAX / GLYCOLAX) packet Take 17 g by mouth daily as needed for mild constipation. Reported on 01/27/2016 30 each 3  . potassium chloride (K-DUR) 10 MEQ tablet TAKE 1 TABLET BY MOUTH TWICE A DAY. 60 tablet 5  . potassium chloride (K-DUR) 10 MEQ tablet TAKE 1 TABLET BY MOUTH TWICE A DAY. 60 tablet 5  . propranolol (INDERAL) 10 MG tablet TAKE 1 TABLET BY MOUTH THREE TIMES A DAY. 90 tablet 5  . sertraline (ZOLOFT) 100 MG tablet Take 1 tablet (100 mg total) by mouth daily. 30 tablet 2  . torsemide (DEMADEX) 20 MG tablet TAKE 1 TABLETS BY MOUTH EACH MORNING. 30 tablet 5  . torsemide (DEMADEX) 20 MG tablet TAKE (1) TABLET BY MOUTH EACH MORNING. 30 tablet 0  . traZODone (DESYREL) 150 MG tablet Take 1 tablet (150 mg total) by mouth at bedtime. 30 tablet 2   No current facility-administered  medications for this visit.     Allergies as of 10/05/2017 - Review Complete 10/05/2017  Allergen Reaction Noted  . Thorazine [chlorpromazine hcl] Anaphylaxis 10/20/2010  . Acetaminophen Other (See Comments)   . Aspirin Other (See Comments)   . Aspirin-acetaminophen-caffeine Other (See Comments)   . Nsaids Nausea And Vomiting   . Penicillins Nausea And Vomiting   . Tomato Rash 01/17/2012    ROS:  General: Negative for anorexia, weight loss, fever, chills, fatigue, weakness. ENT: Negative for hoarseness, difficulty swallowing , nasal congestion. CV: Negative for chest pain, angina, palpitations, dyspnea on exertion, peripheral edema.   Respiratory: Negative for dyspnea at rest, dyspnea on exertion, cough, sputum, wheezing.  GI: See history of present illness. GU:  Negative for dysuria, hematuria, urinary incontinence, urinary frequency, nocturnal urination.  Endo: Negative for unusual weight change.    Physical Examination:   BP 128/73   Pulse 75   Temp (!) 97.4 F (36.3 C) (Oral)   Ht 5' 10.5" (1.791 m)   Wt 271 lb 3.2 oz (123 kg)   BMI 38.36 kg/m   General: Well-nourished, well-developed in no acute distress.  Eyes: No icterus. Mouth: Oropharyngeal mucosa moist and pink , no lesions erythema or exudate. Lungs: Clear to auscultation bilaterally.  Heart: Regular rate and rhythm, no murmurs rubs or gallops.  Abdomen: Bowel sounds are normal, nontender, nondistended, no hepatosplenomegaly or masses, no abdominal bruits or hernia , no rebound or guarding.   Extremities: No lower extremity edema. No clubbing or deformities. Neuro: Alert and oriented x 4   Skin: Warm and dry, no jaundice.   Psych: Alert and cooperative, normal mood and affect.  Imaging Studies: Ct Chest Wo Contrast  Result Date: 10/04/2017 CLINICAL DATA:  Followup pulmonary nodule. EXAM: CT CHEST WITHOUT CONTRAST TECHNIQUE: Multidetector CT imaging of the chest was performed following the standard protocol without IV contrast. COMPARISON:  12/21/2016, PET-CT 11/29/2011, CT chest 11/01/2011 FINDINGS: Cardiovascular: The heart size appears within normal limits. No pericardial effusion identified. Aortic atherosclerosis. Calcification in the LAD coronary artery identified. Mediastinum/Nodes: No enlarged mediastinal or axillary lymph nodes. Thyroid gland, trachea, and esophagus demonstrate no significant findings. Lungs/Pleura: No pleural effusion or airspace consolidation. The slowly growing nodule within the right middle lobe is again identified. This has been present on studies dating back to 11/01/2011. Currently this measures 1.6 x 1.0 by 2.1 cm (volume  = 1.8 cm^3), image 87/5 and 67/3. On the most recent previous exam this measured 1.8 x 1.0 by 2.0 cm (volume = 1.9 cm^3). Allowing for differences in technique this is not significantly changed. On study from 11/01/2011 this nodule measured 1.0 x 0.9 by 1.8 cm (volume = 0.8 cm^3). Upper Abdomen: No acute abnormality. Musculoskeletal: Mild degenerative disc disease identified within the lower thoracic spine. IMPRESSION: 1. When compared with the most recent previous exam the right middle lobe nodule is not significantly changed in the interval. When compared with the most remote comparison exam from 2013 the nodule has doubled in size. This remains indeterminate. A slow-growing indolent neoplastic process cannot be excluded. Mild FDG uptake was associated with this nodule on study from 11/30/2011 with an SUV max of 2.5. 2. Aortic Atherosclerosis (ICD10-I70.0). 3. LAD coronary artery calcifications. Electronically Signed   By: Kerby Moors M.D.   On: 10/04/2017 16:42

## 2017-10-05 NOTE — Assessment & Plan Note (Signed)
Continues to complain of diarrhea associated with abdominal cramping, occurring multiple times daily and some at nighttime after she goes to bed.  Patient is a difficult historian but she claims she is having 8-9 stools daily.  States she really has a solid stool.  Occasional bright red blood per rectum.  Her colonoscopy is up-to-date.  Recommended stool studies after her last visit but she did not follow through.  Social worker states she was never contacted.  Received with GI pathogen panel.  Further recommendations to follow.  Continue Bentyl as before.  As far as reflux, she is on pantoprazole twice daily.  Appetite okay.  Occasional breakthrough heartburn.  She complains of "vomiting" but actually she is describing coughing up white phlegm.  Doubt this is a primary GI issue.  Recommend she follow-up with her PCP.

## 2017-10-16 ENCOUNTER — Other Ambulatory Visit (HOSPITAL_COMMUNITY): Payer: Self-pay | Admitting: Psychiatry

## 2017-11-12 ENCOUNTER — Ambulatory Visit (HOSPITAL_COMMUNITY)
Admission: RE | Admit: 2017-11-12 | Discharge: 2017-11-12 | Disposition: A | Discharge status: Home / Self Care | Payer: Medicare Other | Source: Ambulatory Visit | Attending: Family Medicine | Admitting: Family Medicine

## 2017-11-12 ENCOUNTER — Encounter (HOSPITAL_COMMUNITY): Payer: Self-pay

## 2017-11-12 ENCOUNTER — Ambulatory Visit (HOSPITAL_COMMUNITY): Payer: Medicare Other

## 2017-11-12 DIAGNOSIS — Z1231 Encounter for screening mammogram for malignant neoplasm of breast: Secondary | ICD-10-CM | POA: Insufficient documentation

## 2017-11-14 ENCOUNTER — Other Ambulatory Visit: Payer: Self-pay | Admitting: Gastroenterology

## 2017-11-14 ENCOUNTER — Other Ambulatory Visit: Payer: Self-pay | Admitting: Family Medicine

## 2017-11-14 NOTE — Telephone Encounter (Signed)
Approve times 6

## 2017-11-20 ENCOUNTER — Ambulatory Visit (INDEPENDENT_AMBULATORY_CARE_PROVIDER_SITE_OTHER): Payer: Medicare Other | Admitting: Family Medicine

## 2017-11-20 ENCOUNTER — Encounter: Payer: Self-pay | Admitting: Family Medicine

## 2017-11-20 VITALS — BP 133/82 | Ht 70.0 in | Wt 271.0 lb

## 2017-11-20 DIAGNOSIS — M542 Cervicalgia: Secondary | ICD-10-CM

## 2017-11-20 MED ORDER — CHLORZOXAZONE 500 MG PO TABS: ORAL_TABLET | ORAL | 0 refills | Status: DC

## 2017-11-20 NOTE — Progress Notes (Signed)
   Subjective:    Patient ID: [name], female    DOB: [birth year], 57 y.o.   MRN: [id number]  HPI Patient arrives with left sided neck pain for 2 months. Left-sided neck pain present for the past couple months hurts with certain movements rotating to the left hurts it goes from the base of her neck all the way to her shoulder region does not go down the arm no numbness tingling no weakness.  Does not wake her up at night does not know of any injury that triggered it is already on anti-inflammatories. Review of Systems  Constitutional: Negative for activity change, fatigue and fever.  HENT: Negative for congestion.   Respiratory: Negative for cough, chest tightness and shortness of breath.   Cardiovascular: Negative for chest pain and leg swelling.  Gastrointestinal: Negative for abdominal pain and diarrhea.  Musculoskeletal: Positive for arthralgias.  Skin: Negative for color change.  Neurological: Negative for headaches.  Psychiatric/Behavioral: Negative for agitation and behavioral problems.       Objective:   Physical Exam  Constitutional: She appears well-nourished. No distress.  Cardiovascular: Normal rate, regular rhythm and normal heart sounds.  No murmur heard. Pulmonary/Chest: Effort normal and breath sounds normal. No respiratory distress.  Musculoskeletal: She exhibits no edema.  Lymphadenopathy:    She has no cervical adenopathy.  Neurological: She is alert. She exhibits normal muscle tone.  Psychiatric: Her behavior is normal.  Vitals reviewed.  Significant neck pain consistent with a pulled muscle I doubt a pinched nerve may need x-rays if ongoing may need physical therapy       Assessment & Plan:  Stretches were shown Continue diclofenac twice daily Use muscle relaxer in the evening time Caution drowsiness Follow-up here in 2 to 3 weeks May need physical therapy may need x-rays at that time

## 2017-12-05 ENCOUNTER — Ambulatory Visit (INDEPENDENT_AMBULATORY_CARE_PROVIDER_SITE_OTHER): Payer: Medicare Other | Admitting: Family Medicine

## 2017-12-05 ENCOUNTER — Encounter: Payer: Self-pay | Admitting: Family Medicine

## 2017-12-05 VITALS — BP 112/78 | Ht 70.0 in | Wt 266.0 lb

## 2017-12-05 DIAGNOSIS — M542 Cervicalgia: Secondary | ICD-10-CM | POA: Diagnosis not present

## 2017-12-05 NOTE — Progress Notes (Signed)
   Subjective:    Patient ID: [name], female    DOB: 03/22/1961, 57 y.o.   MRN: [id number]  HPI Patient is here today for a follow up from 11/20/2017 visit for cervical pain.  Patient states neck pain is doing better no radiation down the arms no difficulty with her hands no numbness She states it is some better, also states she can swallow her pills and gets choked a lot. Patient has had her esophagus stretched before she feels like she is having trouble but she does not have any trouble eating or drinking its just with pills Review of Systems No weight loss she does smoke she denies hoarseness no hemoptysis no breathing troubles please see above    Objective:   Physical Exam Neck no masses trachea appears normal no adenopathy lungs clear no crackles heart regular no murmurs       Assessment & Plan:  Patient encouraged to quit smoking Neck pain gradually getting better no other intervention necessary  Intermittent difficulty swallowing pills but doing fairly well with eating and drinking It is often difficult to know for certain the level of magnitude with this patient because she tends to be very symptom amplified in her history on a regular basis  We will touch base with gastroenterology

## 2017-12-12 DIAGNOSIS — G4733 Obstructive sleep apnea (adult) (pediatric): Secondary | ICD-10-CM | POA: Diagnosis not present

## 2017-12-14 ENCOUNTER — Other Ambulatory Visit (HOSPITAL_COMMUNITY): Payer: Self-pay | Admitting: Psychiatry

## 2017-12-14 ENCOUNTER — Telehealth: Payer: Self-pay | Admitting: Gastroenterology

## 2017-12-14 ENCOUNTER — Other Ambulatory Visit: Payer: Self-pay | Admitting: Family Medicine

## 2017-12-14 NOTE — Telephone Encounter (Signed)
Please make nonurgent ov for dysphagia. Received message from Dr. Wolfgang Phoenix regarding patient's ongoing complaint.

## 2017-12-17 ENCOUNTER — Other Ambulatory Visit (HOSPITAL_COMMUNITY): Payer: Self-pay | Admitting: Psychiatry

## 2017-12-17 ENCOUNTER — Encounter: Payer: Self-pay | Admitting: Internal Medicine

## 2017-12-17 ENCOUNTER — Telehealth (HOSPITAL_COMMUNITY): Payer: Self-pay | Admitting: *Deleted

## 2017-12-17 MED ORDER — TRAZODONE HCL 150 MG PO TABS: 150.0000 mg | ORAL_TABLET | Freq: Every day | ORAL | 2 refills | Status: DC

## 2017-12-17 NOTE — Telephone Encounter (Signed)
PATIENT SCHEDULED AND LETTER SENT  °

## 2017-12-17 NOTE — Telephone Encounter (Signed)
Dr Harrington [facility] called & left voice message stating that refill request was denied due to refill to early.  Pharmacist stated last refill was on 11/14/17. And that according to their records refill is due for  Trazodone 150 mg

## 2017-12-17 NOTE — Telephone Encounter (Signed)
sent 

## 2017-12-17 NOTE — Progress Notes (Signed)
Nurses-please contact the patient's Theatre stage manager.  For now because [name] can swallow liquids and foods we will just follow with the patient regarding her statement that sometimes hard to swallow.  If this becomes worse please follow-up sooner otherwise we will follow her up in July.  Please let the social service worker know that we did contact gastroenterology who is willing to see the patient should this get worse

## 2017-12-19 NOTE — Progress Notes (Signed)
Called and spoke with social worker Engineer, civil (consulting). She states she will call her today and see how she is doing. If having issues she will call us back.

## 2017-12-20 ENCOUNTER — Ambulatory Visit (INDEPENDENT_AMBULATORY_CARE_PROVIDER_SITE_OTHER): Payer: Medicare Other | Admitting: Psychiatry

## 2017-12-20 ENCOUNTER — Encounter (HOSPITAL_COMMUNITY): Payer: Self-pay | Admitting: Psychiatry

## 2017-12-20 VITALS — BP 126/67 | HR 78 | Ht 70.0 in | Wt 266.0 lb

## 2017-12-20 DIAGNOSIS — Z736 Limitation of activities due to disability: Secondary | ICD-10-CM

## 2017-12-20 DIAGNOSIS — Z818 Family history of other mental and behavioral disorders: Secondary | ICD-10-CM | POA: Diagnosis not present

## 2017-12-20 DIAGNOSIS — F79 Unspecified intellectual disabilities: Secondary | ICD-10-CM

## 2017-12-20 DIAGNOSIS — Z62810 Personal history of physical and sexual abuse in childhood: Secondary | ICD-10-CM | POA: Diagnosis not present

## 2017-12-20 DIAGNOSIS — F251 Schizoaffective disorder, depressive type: Secondary | ICD-10-CM

## 2017-12-20 MED ORDER — LAMOTRIGINE 100 MG PO TABS: 100.0000 mg | ORAL_TABLET | Freq: Two times a day (BID) | ORAL | 2 refills | Status: DC

## 2017-12-20 MED ORDER — SERTRALINE HCL 100 MG PO TABS: 100.0000 mg | ORAL_TABLET | Freq: Every day | ORAL | 2 refills | Status: DC

## 2017-12-20 MED ORDER — PALIPERIDONE ER 9 MG PO TB24: 9.0000 mg | ORAL_TABLET | Freq: Every day | ORAL | 2 refills | Status: DC

## 2017-12-20 MED ORDER — BENZTROPINE MESYLATE 1 MG PO TABS: 1.0000 mg | ORAL_TABLET | Freq: Every day | ORAL | 2 refills | Status: DC

## 2017-12-20 MED ORDER — TRAZODONE HCL 100 MG PO TABS: 200.0000 mg | ORAL_TABLET | Freq: Every day | ORAL | 2 refills | Status: DC
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
## 2017-12-26 ENCOUNTER — Ambulatory Visit: Payer: Medicare Other | Admitting: Family Medicine

## 2018-01-15 ENCOUNTER — Other Ambulatory Visit: Payer: Self-pay | Admitting: Gastroenterology

## 2018-01-17 DIAGNOSIS — E785 Hyperlipidemia, unspecified: Secondary | ICD-10-CM | POA: Diagnosis not present

## 2018-01-17 DIAGNOSIS — E1169 Type 2 diabetes mellitus with other specified complication: Secondary | ICD-10-CM | POA: Diagnosis not present

## 2018-01-17 DIAGNOSIS — I10 Essential (primary) hypertension: Secondary | ICD-10-CM | POA: Diagnosis not present

## 2018-01-17 DIAGNOSIS — E119 Type 2 diabetes mellitus without complications: Secondary | ICD-10-CM | POA: Diagnosis not present

## 2018-01-18 LAB — BASIC METABOLIC PANEL
BUN/Creatinine Ratio: 12 (ref 9–23)
BUN: 12 mg/dL (ref 6–24)
CALCIUM: 9 mg/dL (ref 8.7–10.2)
CHLORIDE: 97 mmol/L (ref 96–106)
CO2: 29 mmol/L (ref 20–29)
Creatinine, Ser: 1.02 mg/dL — ABNORMAL HIGH (ref 0.57–1.00)
GFR calc Af Amer: 72 mL/min/{1.73_m2} (ref >59)
GFR calc non Af Amer: 62 mL/min/{1.73_m2} (ref >59)
GLUCOSE: 107 mg/dL — AB (ref 65–99)
POTASSIUM: 4.3 mmol/L (ref 3.5–5.2)
SODIUM: 142 mmol/L (ref 134–144)

## 2018-01-18 LAB — LIPID PANEL
Chol/HDL Ratio: 2.4 ratio (ref 0.0–4.4)
Cholesterol, Total: 123 mg/dL (ref 100–199)
HDL: 52 mg/dL (ref >39)
LDL Calculated: 51 mg/dL (ref 0–99)
Triglycerides: 100 mg/dL (ref 0–149)
VLDL Cholesterol Cal: 20 mg/dL (ref 5–40)

## 2018-01-18 LAB — HEMOGLOBIN A1C
ESTIMATED AVERAGE GLUCOSE: 143 mg/dL
HEMOGLOBIN A1C: 6.6 % — AB (ref 4.8–5.6)

## 2018-01-18 LAB — MICROALBUMIN / CREATININE URINE RATIO

## 2018-01-25 ENCOUNTER — Encounter: Payer: Self-pay | Admitting: Family Medicine

## 2018-01-25 ENCOUNTER — Ambulatory Visit (INDEPENDENT_AMBULATORY_CARE_PROVIDER_SITE_OTHER): Payer: Medicare Other | Admitting: Family Medicine

## 2018-01-25 VITALS — BP 126/82 | Ht 70.0 in | Wt 274.2 lb

## 2018-01-25 DIAGNOSIS — I10 Essential (primary) hypertension: Secondary | ICD-10-CM | POA: Diagnosis not present

## 2018-01-25 DIAGNOSIS — E119 Type 2 diabetes mellitus without complications: Secondary | ICD-10-CM | POA: Diagnosis not present

## 2018-01-25 DIAGNOSIS — R911 Solitary pulmonary nodule: Secondary | ICD-10-CM | POA: Diagnosis not present

## 2018-01-25 NOTE — Patient Instructions (Addendum)
Diabetes Mellitus and Nutrition When you have diabetes (diabetes mellitus), it is very important to have healthy eating habits because your blood sugar (glucose) levels are greatly affected by what you eat and drink. Eating healthy foods in the appropriate amounts, at about the same times every day, can help you:  Control your blood glucose.  Lower your risk of heart disease.  Improve your blood pressure.  Reach or maintain a healthy weight.  Every person with diabetes is different, and each person has different needs for a meal plan. Your health care provider may recommend that you work with a diet and nutrition specialist (dietitian) to make a meal plan that is best for you. Your meal plan may vary depending on factors such as:  The calories you need.  The medicines you take.  Your weight.  Your blood glucose, blood pressure, and cholesterol levels.  Your activity level.  Other health conditions you have, such as heart or kidney disease.  How do carbohydrates affect me? Carbohydrates affect your blood glucose level more than any other type of food. Eating carbohydrates naturally increases the amount of glucose in your blood. Carbohydrate counting is a method for keeping track of how many carbohydrates you eat. Counting carbohydrates is important to keep your blood glucose at a healthy level, especially if you use insulin or take certain oral diabetes medicines. It is important to know how many carbohydrates you can safely have in each meal. This is different for every person. Your dietitian can help you calculate how many carbohydrates you should have at each meal and for snack. Foods that contain carbohydrates include:  Bread, cereal, rice, pasta, and crackers.  Potatoes and corn.  Peas, beans, and lentils.  Milk and yogurt.  Fruit and juice.  Desserts, such as cakes, cookies, ice cream, and candy.  How does alcohol affect me? Alcohol can cause a sudden decrease in blood  glucose (hypoglycemia), especially if you use insulin or take certain oral diabetes medicines. Hypoglycemia can be a life-threatening condition. Symptoms of hypoglycemia (sleepiness, dizziness, and confusion) are similar to symptoms of having too much alcohol. If your health care provider says that alcohol is safe for you, follow these guidelines:  Limit alcohol intake to no more than 1 drink per day for nonpregnant women and 2 drinks per day for men. One drink equals 12 oz of beer, 5 oz of wine, or 1 oz of hard liquor.  Do not drink on an empty stomach.  Keep yourself hydrated with water, diet soda, or unsweetened iced tea.  Keep in mind that regular soda, juice, and other mixers may contain a lot of sugar and must be counted as carbohydrates.  What are tips for following this plan? Reading food labels  Start by checking the serving size on the label. The amount of calories, carbohydrates, fats, and other nutrients listed on the label are based on one serving of the food. Many foods contain more than one serving per package.  Check the total grams (g) of carbohydrates in one serving. You can calculate the number of servings of carbohydrates in one serving by dividing the total carbohydrates by 15. For example, if a food has 30 g of total carbohydrates, it would be equal to 2 servings of carbohydrates.  Check the number of grams (g) of saturated and trans fats in one serving. Choose foods that have low or no amount of these fats.  Check the number of milligrams (mg) of sodium in one serving. Most people   should limit total sodium intake to less than 2,300 mg per day.  Always check the nutrition information of foods labeled as "low-fat" or "nonfat". These foods may be higher in added sugar or refined carbohydrates and should be avoided.  Talk to your dietitian to identify your daily goals for nutrients listed on the label. Shopping  Avoid buying canned, premade, or processed foods. These  foods tend to be high in fat, sodium, and added sugar.  Shop around the outside edge of the grocery store. This includes fresh fruits and vegetables, bulk grains, fresh meats, and fresh dairy. Cooking  Use low-heat cooking methods, such as baking, instead of high-heat cooking methods like deep frying.  Cook using healthy oils, such as olive, canola, or sunflower oil.  Avoid cooking with butter, cream, or high-fat meats. Meal planning  Eat meals and snacks regularly, preferably at the same times every day. Avoid going long periods of time without eating.  Eat foods high in fiber, such as fresh fruits, vegetables, beans, and whole grains. Talk to your dietitian about how many servings of carbohydrates you can eat at each meal.  Eat 4-6 ounces of lean protein each day, such as lean meat, chicken, fish, eggs, or tofu. 1 ounce is equal to 1 ounce of meat, chicken, or fish, 1 egg, or 1/4 cup of tofu.  Eat some foods each day that contain healthy fats, such as avocado, nuts, seeds, and fish. Lifestyle   Check your blood glucose regularly.  Exercise at least 30 minutes 5 or more days each week, or as told by your health care provider.  Take medicines as told by your health care provider.  Do not use any products that contain nicotine or tobacco, such as cigarettes and e-cigarettes. If you need help quitting, ask your health care provider.  Work with a Social worker or diabetes educator to identify strategies to manage stress and any emotional and social challenges. What are some questions to ask my health care provider?  Do I need to meet with a diabetes educator?  Do I need to meet with a dietitian?  What number can I call if I have questions?  When are the best times to check my blood glucose? Where to find more information:  American Diabetes Association: diabetes.org/food-and-fitness/food  Academy of Nutrition and Dietetics:  PokerClues.dk  [employer] of Diabetes and Digestive and Kidney Diseases (NIH): ContactWire.be Summary  A healthy meal plan will help you control your blood glucose and maintain a healthy lifestyle.  Working with a diet and nutrition specialist (dietitian) can help you make a meal plan that is best for you.  Keep in mind that carbohydrates and alcohol have immediate effects on your blood glucose levels. It is important to count carbohydrates and to use alcohol carefully. This information is not intended to replace advice given to you by your health care provider. Make sure you discuss any questions you have with your health care provider. Document Released: 03/23/2005 Document Revised: 07/31/2016 Document Reviewed: 07/31/2016 Elsevier Interactive Patient Education  2018 [employer].  Steps to Quit Smoking Smoking tobacco can be bad for your health. It can also affect almost every organ in your body. Smoking puts you and people around you at risk for many serious long-lasting (chronic) diseases. Quitting smoking is hard, but it is one of the best things that you can do for your health. It is never too late to quit. What are the benefits of quitting smoking? When you quit smoking, you lower  your risk for getting serious diseases and conditions. They can include:  Lung cancer or lung disease.  Heart disease.  Stroke.  Heart attack.  Not being able to have children (infertility).  Weak bones (osteoporosis) and broken bones (fractures).  If you have coughing, wheezing, and shortness of breath, those symptoms may get better when you quit. You may also get sick less often. If you are pregnant, quitting smoking can help to lower your chances of having a baby of low birth weight. What can I do to help me quit smoking? Talk with your doctor about what can help you  quit smoking. Some things you can do (strategies) include:  Quitting smoking totally, instead of slowly cutting back how much you smoke over a period of time.  Going to in-person counseling. You are more likely to quit if you go to many counseling sessions.  Using resources and support systems, such as: ? Database administrator with a Social worker. ? Phone quitlines. ? Careers information officer. ? Support groups or group counseling. ? Text messaging programs. ? Mobile phone apps or applications.  Taking medicines. Some of these medicines may have nicotine in them. If you are pregnant or breastfeeding, do not take any medicines to quit smoking unless your doctor says it is okay. Talk with your doctor about counseling or other things that can help you.  Talk with your doctor about using more than one strategy at the same time, such as taking medicines while you are also going to in-person counseling. This can help make quitting easier. What things can I do to make it easier to quit? Quitting smoking might feel very hard at first, but there is a lot that you can do to make it easier. Take these steps:  Talk to your family and friends. Ask them to support and encourage you.  Call phone quitlines, reach out to support groups, or work with a Social worker.  Ask people who smoke to not smoke around you.  Avoid places that make you want (trigger) to smoke, such as: ? Bars. ? Parties. ? Smoke-break areas at work.  Spend time with people who do not smoke.  Lower the stress in your life. Stress can make you want to smoke. Try these things to help your stress: ? Getting regular exercise. ? Deep-breathing exercises. ? Yoga. ? Meditating. ? Doing a body scan. To do this, close your eyes, focus on one area of your body at a time from head to toe, and notice which parts of your body are tense. Try to relax the muscles in those areas.  Download or buy apps on your mobile phone or tablet that can help you  stick to your quit plan. There are many free apps, such as QuitGuide from the [employer] Office manager for Disease Control and Prevention). You can find more support from smokefree.gov and other websites.  This information is not intended to replace advice given to you by your health care provider. Make sure you discuss any questions you have with your health care provider. Document Released: 04/22/2009 Document Revised: 02/22/2016 Document Reviewed: 11/10/2014 Elsevier Interactive Patient Education  2018 [employer].

## 2018-01-25 NOTE — Progress Notes (Signed)
Subjective:    Patient ID: [name], female    DOB: 08-14-1960, 57 y.o.   MRN: [id number]   Diabetes   She presents for her follow-up diabetic visit. She has type 2 diabetes mellitus. Pertinent negatives for hypoglycemia include no confusion. Pertinent negatives for diabetes include no chest pain, no fatigue, no polydipsia, no polyphagia and no weakness. Risk factors for coronary artery disease include diabetes mellitus, dyslipidemia, hypertension and obesity. She is compliant with treatment all of the time. Her weight is stable. She is following a diabetic diet.    Discuss recent labs Results for orders placed or performed in visit on 32/67/12  Basic Metabolic Panel (BMET)  Result Value Ref Range   Glucose 107 (H) 65 - 99 mg/dL   BUN 12 6 - 24 mg/dL   Creatinine, Ser 1.02 (H) 0.57 - 1.00 mg/dL   GFR calc non Af Amer 62 >59 mL/min/1.73   GFR calc Af Amer 72 >59 mL/min/1.73   BUN/Creatinine Ratio 12 9 - 23   Sodium 142 134 - 144 mmol/L   Potassium 4.3 3.5 - 5.2 mmol/L   Chloride 97 96 - 106 mmol/L   CO2 29 20 - 29 mmol/L   Calcium 9.0 8.7 - 10.2 mg/dL  HgB A1c  Result Value Ref Range   Hgb A1c MFr Bld 6.6 (H) 4.8 - 5.6 %   Est. average glucose Bld gHb Est-mCnc 143 mg/dL  Lipid Profile  Result Value Ref Range   Cholesterol, Total 123 100 - 199 mg/dL   Triglycerides 100 0 - 149 mg/dL   HDL 52 >39 mg/dL   VLDL Cholesterol Cal 20 5 - 40 mg/dL   LDL Calculated 51 0 - 99 mg/dL   Chol/HDL Ratio 2.4 0.0 - 4.4 ratio  Urine Microalbumin w/creat. ratio  Result Value Ref Range   Creatinine, Urine CANCELED mg/dL   Microalbumin, Urine CANCELED   POCT HgB A1C  Result Value Ref Range   Hemoglobin A1C 5.7    Labs reviewed in detail Back pain and leg pain significant back pain leg pain intermittent does not radiate down the legs denies any other problems has had this for a long span of time Results for orders placed or performed in visit on 45/80/99  Basic Metabolic Panel (BMET)    Result Value Ref Range   Glucose 107 (H) 65 - 99 mg/dL   BUN 12 6 - 24 mg/dL   Creatinine, Ser 1.02 (H) 0.57 - 1.00 mg/dL   GFR calc non Af Amer 62 >59 mL/min/1.73   GFR calc Af Amer 72 >59 mL/min/1.73   BUN/Creatinine Ratio 12 9 - 23   Sodium 142 134 - 144 mmol/L   Potassium 4.3 3.5 - 5.2 mmol/L   Chloride 97 96 - 106 mmol/L   CO2 29 20 - 29 mmol/L   Calcium 9.0 8.7 - 10.2 mg/dL  HgB A1c  Result Value Ref Range   Hgb A1c MFr Bld 6.6 (H) 4.8 - 5.6 %   Est. average glucose Bld gHb Est-mCnc 143 mg/dL  Lipid Profile  Result Value Ref Range   Cholesterol, Total 123 100 - 199 mg/dL   Triglycerides 100 0 - 149 mg/dL   HDL 52 >39 mg/dL   VLDL Cholesterol Cal 20 5 - 40 mg/dL   LDL Calculated 51 0 - 99 mg/dL   Chol/HDL Ratio 2.4 0.0 - 4.4 ratio  Urine Microalbumin w/creat. ratio  Result Value Ref Range   Creatinine, Urine CANCELED mg/dL  Microalbumin, Urine CANCELED   POCT HgB A1C  Result Value Ref Range   Hemoglobin A1C 5.7     Review of Systems  Constitutional: Negative for activity change, appetite change and fatigue.  HENT: Negative for congestion.   Respiratory: Negative for cough.   Cardiovascular: Negative for chest pain.  Gastrointestinal: Negative for abdominal pain.  Endocrine: Negative for polydipsia and polyphagia.  Skin: Negative for color change.  Neurological: Negative for weakness.  Psychiatric/Behavioral: Negative for confusion.       Objective:   Physical Exam  Constitutional: She appears well-nourished. No distress.  Cardiovascular: Normal rate, regular rhythm and normal heart sounds.  No murmur heard. Pulmonary/Chest: Effort normal and breath sounds normal. No respiratory distress.  Musculoskeletal: She exhibits no edema.  Lymphadenopathy:    She has no cervical adenopathy.  Neurological: She is alert. She exhibits normal muscle tone.  Psychiatric: Her behavior is normal.  Vitals reviewed.         Assessment & Plan:  Diabetes- subpar  control compared to where she was this is primarily because she is drinking sodas on a regular basis I showed her examples of how much sugar is in sodas I encourage her to stick with water and if she had to have soda to use diet soda  She has a pulmonary nodule followed by with cardiothoracic she will have a repeat CT scan next spring I talked with her at length about the importance of quitting smoking  Back pain leg pain I believe that this is baseline for the patient she uses a cane to walk around negative straight leg raise recommend Tylenol as needed no diagnostic testing stretching exercises recommended  Hyperlipidemia good control continue current medications previous labs reviewed with the patient  Blood pressure good control continue current measures  Obesity patient was encouraged to get away from sugary drinks and try to lose weight  Follow-up in 5 to 6 months  25 minutes was spent with the patient.  This statement verifies that 25 minutes was indeed spent with the patient.  More than 50% of this visit-total duration of the visit-was spent in counseling and coordination of care. The issues that the patient came in for today as reflected in the diagnosis (s) please refer to documentation for further details.

## 2018-01-31 ENCOUNTER — Encounter: Payer: Self-pay | Admitting: Family Medicine

## 2018-02-08 ENCOUNTER — Other Ambulatory Visit: Payer: Self-pay | Admitting: Family Medicine

## 2018-02-28 ENCOUNTER — Encounter: Payer: Self-pay | Admitting: Family Medicine

## 2018-02-28 ENCOUNTER — Ambulatory Visit (INDEPENDENT_AMBULATORY_CARE_PROVIDER_SITE_OTHER): Payer: Medicare Other | Admitting: Family Medicine

## 2018-02-28 ENCOUNTER — Telehealth: Payer: Self-pay | Admitting: Family Medicine

## 2018-02-28 VITALS — BP 134/80 | Ht 70.0 in | Wt 262.0 lb

## 2018-02-28 DIAGNOSIS — R270 Ataxia, unspecified: Secondary | ICD-10-CM

## 2018-02-28 DIAGNOSIS — R252 Cramp and spasm: Secondary | ICD-10-CM

## 2018-02-28 DIAGNOSIS — R296 Repeated falls: Secondary | ICD-10-CM

## 2018-02-28 DIAGNOSIS — R5383 Other fatigue: Secondary | ICD-10-CM | POA: Diagnosis not present

## 2018-02-28 DIAGNOSIS — R269 Unspecified abnormalities of gait and mobility: Secondary | ICD-10-CM | POA: Diagnosis not present

## 2018-02-28 NOTE — Telephone Encounter (Signed)
Rx written awaiting Dr.signature.

## 2018-02-28 NOTE — Progress Notes (Signed)
   Subjective:    Patient ID: [name], female    DOB: 03/30/1961, 57 y.o.   MRN: [id number]  HPI  Patient is here today with complaints of legs and back cramps.She states this has been on going for the last two months. She states she has tried mustard and rubbed legs in alcohol. She can not sleep due to this.  Patient is homebound does not have a car does not drive depends on others  Having difficult time walking using a cane and having back pain leg pain Patient impaired with her history makes it difficult to discern level of issue Patient feels that she needs a walker to get around  Will check lab work    Review of Systems  Constitutional: Negative for activity change, fatigue and fever.  HENT: Negative for congestion and rhinorrhea.   Respiratory: Negative for cough, chest tightness and shortness of breath.   Cardiovascular: Negative for chest pain and leg swelling.  Gastrointestinal: Negative for abdominal pain and nausea.  Skin: Negative for color change.  Neurological: Negative for dizziness and headaches.  Psychiatric/Behavioral: Negative for agitation and behavioral problems.       Objective:   Physical Exam  Constitutional: She appears well-nourished. No distress.  HENT:  Head: Normocephalic and atraumatic.  Eyes: Right eye exhibits no discharge. Left eye exhibits no discharge.  Neck: No tracheal deviation present.  Cardiovascular: Normal rate, regular rhythm and normal heart sounds.  No murmur heard. Pulmonary/Chest: Effort normal and breath sounds normal. No respiratory distress.  Musculoskeletal: She exhibits no edema.  Lymphadenopathy:    She has no cervical adenopathy.  Neurological: She is alert. Coordination normal.  Skin: Skin is warm and dry.  Psychiatric: She has a normal mood and affect. Her behavior is normal.  Vitals reviewed.         Assessment & Plan:  Patient with subjective discomfort in her back into her legs with muscle  cramps Hold off on any type of muscle relaxers Range of motion stretches were shown Patient having difficulty with her walking unsteady Would benefit from using a walker at times but I encouraged her to improve her movement strength and activity Patient is homebound would benefit from physical therapy consultation for treatment over the next 2 to 4 weeks

## 2018-02-28 NOTE — Telephone Encounter (Signed)
Nurses-please do a prescription for a walker with wheels and seat Diagnosis ataxia leg weakness and severe osteoarthritis of both knees I will sign this prescription on Friday Prescription to be forwarded to [facility] so she will forward to home health

## 2018-03-01 ENCOUNTER — Encounter: Payer: Self-pay | Admitting: Family Medicine

## 2018-03-01 LAB — BASIC METABOLIC PANEL
BUN/Creatinine Ratio: 11 (ref 9–23)
BUN: 11 mg/dL (ref 6–24)
CALCIUM: 9.7 mg/dL (ref 8.7–10.2)
CHLORIDE: 96 mmol/L (ref 96–106)
CO2: 27 mmol/L (ref 20–29)
CREATININE: 0.98 mg/dL (ref 0.57–1.00)
GFR, EST AFRICAN AMERICAN: 75 mL/min/{1.73_m2} (ref >59)
GFR, EST NON AFRICAN AMERICAN: 65 mL/min/{1.73_m2} (ref >59)
Glucose: 89 mg/dL (ref 65–99)
Potassium: 4.4 mmol/L (ref 3.5–5.2)
Sodium: 140 mmol/L (ref 134–144)

## 2018-03-01 LAB — TSH: TSH: 3.18 u[IU]/mL (ref 0.450–4.500)

## 2018-03-01 LAB — MAGNESIUM: Magnesium: 1.7 mg/dL (ref 1.6–2.3)

## 2018-03-01 NOTE — Telephone Encounter (Signed)
Prescription faxed to pharmacy.

## 2018-03-13 ENCOUNTER — Ambulatory Visit: Payer: Self-pay | Admitting: Gastroenterology

## 2018-03-13 DIAGNOSIS — M549 Dorsalgia, unspecified: Secondary | ICD-10-CM | POA: Diagnosis not present

## 2018-03-13 DIAGNOSIS — R252 Cramp and spasm: Secondary | ICD-10-CM | POA: Diagnosis not present

## 2018-03-13 DIAGNOSIS — R262 Difficulty in walking, not elsewhere classified: Secondary | ICD-10-CM | POA: Diagnosis not present

## 2018-03-13 DIAGNOSIS — M79606 Pain in leg, unspecified: Secondary | ICD-10-CM | POA: Diagnosis not present

## 2018-03-13 DIAGNOSIS — R296 Repeated falls: Secondary | ICD-10-CM | POA: Diagnosis not present

## 2018-03-13 DIAGNOSIS — R270 Ataxia, unspecified: Secondary | ICD-10-CM | POA: Diagnosis not present

## 2018-03-14 ENCOUNTER — Other Ambulatory Visit (HOSPITAL_COMMUNITY): Payer: Self-pay | Admitting: Psychiatry

## 2018-03-14 DIAGNOSIS — M79606 Pain in leg, unspecified: Secondary | ICD-10-CM | POA: Diagnosis not present

## 2018-03-14 DIAGNOSIS — R296 Repeated falls: Secondary | ICD-10-CM | POA: Diagnosis not present

## 2018-03-14 DIAGNOSIS — M549 Dorsalgia, unspecified: Secondary | ICD-10-CM | POA: Diagnosis not present

## 2018-03-14 DIAGNOSIS — R262 Difficulty in walking, not elsewhere classified: Secondary | ICD-10-CM | POA: Diagnosis not present

## 2018-03-14 DIAGNOSIS — R270 Ataxia, unspecified: Secondary | ICD-10-CM | POA: Diagnosis not present

## 2018-03-14 DIAGNOSIS — R252 Cramp and spasm: Secondary | ICD-10-CM | POA: Diagnosis not present

## 2018-03-18 ENCOUNTER — Telehealth: Payer: Self-pay | Admitting: Family Medicine

## 2018-03-18 ENCOUNTER — Encounter: Payer: Medicare Other | Attending: Family Medicine | Admitting: Nutrition

## 2018-03-18 VITALS — Ht 70.0 in | Wt 274.0 lb

## 2018-03-18 DIAGNOSIS — E1165 Type 2 diabetes mellitus with hyperglycemia: Secondary | ICD-10-CM

## 2018-03-18 DIAGNOSIS — R270 Ataxia, unspecified: Secondary | ICD-10-CM | POA: Diagnosis not present

## 2018-03-18 DIAGNOSIS — E118 Type 2 diabetes mellitus with unspecified complications: Secondary | ICD-10-CM

## 2018-03-18 DIAGNOSIS — R252 Cramp and spasm: Secondary | ICD-10-CM | POA: Diagnosis not present

## 2018-03-18 DIAGNOSIS — IMO0002 Reserved for concepts with insufficient information to code with codable children: Secondary | ICD-10-CM

## 2018-03-18 DIAGNOSIS — E119 Type 2 diabetes mellitus without complications: Secondary | ICD-10-CM | POA: Diagnosis not present

## 2018-03-18 DIAGNOSIS — M549 Dorsalgia, unspecified: Secondary | ICD-10-CM | POA: Diagnosis not present

## 2018-03-18 DIAGNOSIS — M79606 Pain in leg, unspecified: Secondary | ICD-10-CM | POA: Diagnosis not present

## 2018-03-18 DIAGNOSIS — Z713 Dietary counseling and surveillance: Secondary | ICD-10-CM | POA: Insufficient documentation

## 2018-03-18 DIAGNOSIS — R296 Repeated falls: Secondary | ICD-10-CM | POA: Diagnosis not present

## 2018-03-18 DIAGNOSIS — R262 Difficulty in walking, not elsewhere classified: Secondary | ICD-10-CM | POA: Diagnosis not present

## 2018-03-18 DIAGNOSIS — E782 Mixed hyperlipidemia: Secondary | ICD-10-CM

## 2018-03-18 NOTE — Telephone Encounter (Signed)
Called into Rx [facility] spoke to Sarah [name].

## 2018-03-18 NOTE — Progress Notes (Signed)
  Medical Nutrition Therapy:  Appt start time: 1330 end time:  1430.   Assessment:  Primary concerns today: Diabetes Type 2. Had had a heart attack and 3 stokes in the past she reports.  She has trouble sleeping. Is on meds for depression and schizoaffective disorder, Hyperlipidemia, HTN and Reflux. LIves by herself.   Her social worker is here with her today; Foye Spurling. Her CNA helps prepare meals as well as her boyfriend. Eats 1-2 meals per day.  Admits to drinking a lot of regular soda, chips. .  Has a therapist coming in to do exercises and working on her gait and walking. Doing some chair exercises. Limited mobility with a cane. Current diet is excessive in calories, carbs and poor in fresh fruits, vegetables and whole grains. Admits to eating a lot of processed foods. Willing to change eating habits and grocery list to lose weigh and improve her  Diabetes.   Preferred Learning Style:  Visual, verbal, hands on.  Learning Readiness:  Ready  Change in progress   MEDICATIONS:   DIETARY INTAKE:  24-hr recall:  B ( AM): on weekends; eggs, sausage and bacon and biscuits.  Snk ( AM): sods and chips, nabs.   L ( PM): Chicken, spaghetti, meatballs, string beans, pototato salad,  Sweet tea Snk ( PM):  D ( PM): skipped  Snk ( PM):  Chips, cookies, Soda Beverages: soda  Usual physical activity:ADL   Estimated energy needs: 1200  calories 135 g carbohydrates 90 g protein 33 g fat  Progress Towards Goal(s):  In progress.   Nutritional Diagnosis:  NB-1.1 Food and nutrition-related knowledge deficit As related to Diabetes.  As evidenced by  A1C 6.6%..    Intervention:  Nutrition Nutrition and Diabetes education provided on My Plate, CHO counting, meal planning, portion sizes, timing of meals, avoiding snacks between meals unless having a low blood sugar, target ranges for A1C and blood sugars, signs/symptoms and treatment of hyper/hypoglycemia, monitoring blood sugars,  taking medications as prescribed, benefits of exercising 30 minutes per day and prevention of complications of DM. Goals 1. Follow My Plate 2. Eat three meals a day 3. Cut out soda, chips, junk food. 4. Drink only water Lose 5 lbs per month.  Teaching Method Utilized:  Visual Auditory Hands on  Handouts given during visit include:  The Plate Method   Meal Plan Card  Diabetes Instrucitons.  Barriers to learning/adherence to lifestyle change: CVA, HA, Limited mobility.  Demonstrated degree of understanding via:  Teach Back   Monitoring/Evaluation:  Dietary intake, exercise, , and body weight in 1 month(s)..ndmm.

## 2018-03-18 NOTE — Patient Instructions (Signed)
Goals 1. Follow My Plate 2. Eat three meals a day 3. Cut out soda, chips, junk food. 4. Drink only water Lose 5 lbs per month.

## 2018-03-18 NOTE — Telephone Encounter (Signed)
Pt was given an accu check guide meter by nurtritionalist. She is needing the lancets and drum ordered for the machine

## 2018-03-18 NOTE — Telephone Encounter (Signed)
[name] is aware it was called in.

## 2018-03-20 DIAGNOSIS — R270 Ataxia, unspecified: Secondary | ICD-10-CM | POA: Diagnosis not present

## 2018-03-20 DIAGNOSIS — M79606 Pain in leg, unspecified: Secondary | ICD-10-CM | POA: Diagnosis not present

## 2018-03-20 DIAGNOSIS — R296 Repeated falls: Secondary | ICD-10-CM | POA: Diagnosis not present

## 2018-03-20 DIAGNOSIS — R262 Difficulty in walking, not elsewhere classified: Secondary | ICD-10-CM | POA: Diagnosis not present

## 2018-03-20 DIAGNOSIS — R252 Cramp and spasm: Secondary | ICD-10-CM | POA: Diagnosis not present

## 2018-03-20 DIAGNOSIS — M549 Dorsalgia, unspecified: Secondary | ICD-10-CM | POA: Diagnosis not present

## 2018-03-25 ENCOUNTER — Encounter (HOSPITAL_COMMUNITY): Payer: Self-pay | Admitting: Psychiatry

## 2018-03-25 ENCOUNTER — Ambulatory Visit (INDEPENDENT_AMBULATORY_CARE_PROVIDER_SITE_OTHER): Payer: Medicare Other | Admitting: Psychiatry

## 2018-03-25 VITALS — BP 100/60 | HR 83 | Ht 70.0 in | Wt 270.0 lb

## 2018-03-25 DIAGNOSIS — Z736 Limitation of activities due to disability: Secondary | ICD-10-CM | POA: Diagnosis not present

## 2018-03-25 DIAGNOSIS — Z91410 Personal history of adult physical and sexual abuse: Secondary | ICD-10-CM | POA: Diagnosis not present

## 2018-03-25 DIAGNOSIS — Z62810 Personal history of physical and sexual abuse in childhood: Secondary | ICD-10-CM

## 2018-03-25 DIAGNOSIS — F17210 Nicotine dependence, cigarettes, uncomplicated: Secondary | ICD-10-CM

## 2018-03-25 DIAGNOSIS — F251 Schizoaffective disorder, depressive type: Secondary | ICD-10-CM

## 2018-03-25 DIAGNOSIS — Z560 Unemployment, unspecified: Secondary | ICD-10-CM

## 2018-03-25 MED ORDER — BENZTROPINE MESYLATE 1 MG PO TABS: 1.0000 mg | ORAL_TABLET | Freq: Every day | ORAL | 2 refills | Status: DC

## 2018-03-25 MED ORDER — LAMOTRIGINE 100 MG PO TABS: 100.0000 mg | ORAL_TABLET | Freq: Two times a day (BID) | ORAL | 2 refills | Status: DC

## 2018-03-25 MED ORDER — TRAZODONE HCL 100 MG PO TABS: ORAL_TABLET | ORAL | 2 refills | Status: DC

## 2018-03-25 MED ORDER — PALIPERIDONE ER 9 MG PO TB24: 9.0000 mg | ORAL_TABLET | Freq: Every day | ORAL | 2 refills | Status: DC

## 2018-03-25 MED ORDER — SERTRALINE HCL 100 MG PO TABS: 100.0000 mg | ORAL_TABLET | Freq: Every day | ORAL | 2 refills | Status: DC
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
## 2018-03-27 ENCOUNTER — Encounter: Payer: Self-pay | Admitting: Gastroenterology

## 2018-03-27 ENCOUNTER — Ambulatory Visit (INDEPENDENT_AMBULATORY_CARE_PROVIDER_SITE_OTHER): Payer: Medicare Other | Admitting: Gastroenterology

## 2018-03-27 VITALS — BP 124/75 | HR 81 | Temp 97.5°F | Ht 70.0 in | Wt 275.2 lb

## 2018-03-27 DIAGNOSIS — R1310 Dysphagia, unspecified: Secondary | ICD-10-CM | POA: Diagnosis not present

## 2018-03-27 DIAGNOSIS — R262 Difficulty in walking, not elsewhere classified: Secondary | ICD-10-CM | POA: Diagnosis not present

## 2018-03-27 DIAGNOSIS — R197 Diarrhea, unspecified: Secondary | ICD-10-CM | POA: Diagnosis not present

## 2018-03-27 DIAGNOSIS — K580 Irritable bowel syndrome with diarrhea: Secondary | ICD-10-CM | POA: Diagnosis not present

## 2018-03-27 DIAGNOSIS — M549 Dorsalgia, unspecified: Secondary | ICD-10-CM | POA: Diagnosis not present

## 2018-03-27 DIAGNOSIS — R296 Repeated falls: Secondary | ICD-10-CM | POA: Diagnosis not present

## 2018-03-27 DIAGNOSIS — R270 Ataxia, unspecified: Secondary | ICD-10-CM | POA: Diagnosis not present

## 2018-03-27 NOTE — Progress Notes (Signed)
Primary Care Physician: Kathyrn Drown, MD  Primary Gastroenterologist:  Garfield Cornea, MD   Chief Complaint  Patient presents with  . Diarrhea    7 times per day  . Dysphagia    certain foods and with taking medications    HPI: [name] is a 57 y.o. female here for follow-up diarrhea and dysphagia.  Last seen in March.  She did not complete stool studies at that time as requested.  EGD and colonoscopy in March 2018, 4 polyps removed, fragments of hyperplastic polyps.  Next colonoscopy in 5 years.  EGD for dysphagia status post empiric esophageal dilation, exam normal.  Barium pill esophagram after last office visit for persistent dysphagia, no stricture seen.  Patient has a history of poor fitting dentures.  Mixed IBS and GERD as well.  Patient continues to complain of diarrhea having 7 stools a day.  She reports getting up in the middle the night for bowel movements as well.  She also reports having hard stools at least 3 days/week.  She denies skipping a day without a bowel movement.  No melena or rectal bleeding.  No abdominal pain.  She has complained of difficulty swallowing pills and PCP reached out to [place] for that.  It is difficult to be certain regarding the significance of her complaints as she is a difficult historian.  She reports trying to swallow multiple pills at a time and feels like they become lodged in the back of her throat.  She has to get them out in try to swallow them again.  As far as liquids and solids she seems to be doing okay.  She has had EGD and barium pill esophagram within the past 18 months or so.  Next step for evaluation of swallowing would be either speech therapy evaluation versus manometry although I do not think she would actually go through with esophageal manometry.   Current Outpatient Medications  Medication Sig Dispense Refill  . atorvastatin (LIPITOR) 10 MG tablet TAKE 1 TABLET BY MOUTH ONCE A DAY. 30 tablet 5  . benztropine  (COGENTIN) 1 MG tablet Take 1 tablet (1 mg total) by mouth daily. 30 tablet 2  . calcium-vitamin D (OSCAL WITH D) 500-200 MG-UNIT per tablet Take 1 tablet by mouth 3 (three) times daily.      . chlorzoxazone (PARAFON FORTE DSC) 500 MG tablet One qhs prn neck pain 14 tablet 0  . diclofenac (VOLTAREN) 75 MG EC tablet Take 1 tablet (75 mg total) by mouth 2 (two) times daily. 60 tablet 5  . dicyclomine (BENTYL) 10 MG capsule TAKE 1 CAPSULE BY MOUTH FOUR TIMES DAILY BEFORE MEALS AND AT BEDTIME FOR LOOSE STOOLS.HOLD FOR CONSTIPATION 120 capsule 5  . lamoTRIgine (LAMICTAL) 100 MG tablet Take 1 tablet (100 mg total) by mouth 2 (two) times daily. 60 tablet 2  . lisinopril (PRINIVIL,ZESTRIL) 2.5 MG tablet TAKE (1) TABLET BY MOUTH EACH MORNING. 30 tablet 0  . paliperidone (INVEGA) 9 MG 24 hr tablet Take 1 tablet (9 mg total) by mouth daily. 30 tablet 2  . pantoprazole (PROTONIX) 40 MG tablet TAKE 1 TABLET BY MOUTH TWICE DAILY, 30 MINUTES BEFORE MEALS. 60 tablet 3  . potassium chloride (K-DUR) 10 MEQ tablet TAKE 1 TABLET BY MOUTH TWICE A DAY. 60 tablet 5  . propranolol (INDERAL) 10 MG tablet TAKE 1 TABLET BY MOUTH THREE TIMES A DAY. 90 tablet 5  . sertraline (ZOLOFT) 100 MG tablet Take 1 tablet (100  mg total) by mouth daily. 30 tablet 2  . torsemide (DEMADEX) 20 MG tablet TAKE 1 TABLETS BY MOUTH EACH MORNING. 30 tablet 5  . traZODone (DESYREL) 100 MG tablet TAKE 2 TABLETS(200MG ) BY MOUTH AT BEDTIME. 60 tablet 2   No current facility-administered medications for this visit.     Allergies as of 03/27/2018 - Review Complete 03/27/2018  Allergen Reaction Noted  . Thorazine [chlorpromazine hcl] Anaphylaxis 10/20/2010  . Acetaminophen Other (See Comments)   . Aspirin Other (See Comments)   . Aspirin-acetaminophen-caffeine Other (See Comments)   . Nsaids Nausea And Vomiting   . Penicillins Nausea And Vomiting   . Tomato Rash 01/17/2012    ROS:  General: Negative for anorexia, weight loss, fever, chills,  fatigue, weakness. ENT: Negative for hoarseness, difficulty swallowing , nasal congestion. CV: Negative for chest pain, angina, palpitations, dyspnea on exertion, positive peripheral edema.  Respiratory: Negative for dyspnea at rest, dyspnea on exertion, cough, sputum, wheezing.  GI: See history of present illness. GU:  Negative for dysuria, hematuria, urinary incontinence, urinary frequency, nocturnal urination.  Endo: Negative for unusual weight change.    Physical Examination:   BP 124/75   Pulse 81   Temp (!) 97.5 F (36.4 C) (Oral)   Ht 5\' 10"  (1.778 m)   Wt 275 lb 3.2 oz (124.8 kg)   BMI 39.49 kg/m   General: Well-nourished, well-developed in no acute distress.  Accompanied by social worker Engineer, civil (consulting) [name] Eyes: No icterus. Mouth: Oropharyngeal mucosa moist and pink , no lesions erythema or exudate. Lungs: Clear to auscultation bilaterally.  Heart: Regular rate and rhythm, no murmurs rubs or gallops.  Abdomen: Bowel sounds are normal, nontender, nondistended, no hepatosplenomegaly or masses, no abdominal bruits or hernia , no rebound or guarding.   Extremities: 2+ lower extremity edema. No clubbing or deformities. Neuro: Alert and oriented x 4   Skin: Warm and dry, no jaundice.   Psych: Alert and cooperative, normal mood and affect.  Labs:  Lab Results  Component Value Date   CREATININE 0.98 02/28/2018   BUN 11 02/28/2018   NA 140 02/28/2018   K 4.4 02/28/2018   CL 96 02/28/2018   CO2 27 02/28/2018      Imaging Studies: No results found.

## 2018-03-27 NOTE — Assessment & Plan Note (Signed)
Difficult historian.  She has a history of mixed IBS.  Predominantly complaining of diarrhea over the past 6+ months, have not been able to get her to collect stool study as recommended.  She seems willing while in the office but never follows through.  We discussed at length today again with a Education officer, museum, patient should be able to collect stool study with the help of her CNA.  Patient in agreement.  If she fails to follow through this time, at that point would opt to treat symptoms as likely IBS diarrhea.  Patient denies using over-the-counter laxatives, no MiraLAX.  Reports taking Bentyl 4 times daily.

## 2018-03-27 NOTE — Assessment & Plan Note (Signed)
Fairly recent EGD and barium pill esophagram.  Mostly complains of pill dysphagia.  Encouraged her to try to take pills one at a time with plenty of liquids or take with applesauce.  We will continue to monitor for now.  Have her come back in 3 months.  If symptoms progressive at any point, next step might be a modified barium swallow study with speech therapy.  patient would likely be noncompliant with esophageal manometry given past history.

## 2018-03-27 NOTE — Progress Notes (Signed)
CC'ED TO PCP 

## 2018-03-27 NOTE — Patient Instructions (Signed)
1. Please collect stool as discussed. We want to rule out infection before we change your medication to help with the diarrhea.

## 2018-04-01 ENCOUNTER — Telehealth: Payer: Self-pay | Admitting: Family Medicine

## 2018-04-01 NOTE — Telephone Encounter (Signed)
[facility] calling to get prescription for patient testing stripes for Acute Chek machine called into [facility].

## 2018-04-01 NOTE — Telephone Encounter (Signed)
Called to [place] at [phone] with 5 refills.

## 2018-04-17 ENCOUNTER — Other Ambulatory Visit: Payer: Self-pay | Admitting: Family Medicine

## 2018-04-23 ENCOUNTER — Encounter: Payer: Medicare Other | Attending: Family Medicine | Admitting: Nutrition

## 2018-04-23 VITALS — Ht 70.0 in | Wt 267.0 lb

## 2018-04-23 DIAGNOSIS — Z713 Dietary counseling and surveillance: Secondary | ICD-10-CM | POA: Insufficient documentation

## 2018-04-23 DIAGNOSIS — Z8673 Personal history of transient ischemic attack (TIA), and cerebral infarction without residual deficits: Secondary | ICD-10-CM | POA: Insufficient documentation

## 2018-04-23 DIAGNOSIS — R739 Hyperglycemia, unspecified: Secondary | ICD-10-CM

## 2018-04-23 DIAGNOSIS — E119 Type 2 diabetes mellitus without complications: Secondary | ICD-10-CM | POA: Diagnosis not present

## 2018-04-23 DIAGNOSIS — I252 Old myocardial infarction: Secondary | ICD-10-CM | POA: Diagnosis not present

## 2018-04-23 DIAGNOSIS — E669 Obesity, unspecified: Secondary | ICD-10-CM

## 2018-04-23 NOTE — Patient Instructions (Signed)
Goals 1. Get Aide to help prepare meals 2. Don't skip meals 3. Eat three  Meals a day 4. Walk in halls more often and walk in house for 15 minutes a day. 5. Lose 5-10 lbs in the next 2-3 months Get A1 Below 6.5%

## 2018-04-23 NOTE — Progress Notes (Signed)
  Medical Nutrition Therapy:  Appt start time: 1100 end time:  1130    Assessment:  Primary concerns today: Diabetes Type 2. F/u  Here with her caseworker. Had had a heart attack and 3 stokes in the past she reports. Has been drinking a lot of water, cut out a lot junk food and soda. Lost 8 lbs.  Has been walking for exercise  Lab Results  Component Value Date   HGBA1C 6.6 (H) 01/17/2018   CMP Latest Ref Rng & Units 02/28/2018 01/17/2018 06/08/2017  Glucose 65 - 99 mg/dL 89 107(H) -  BUN 6 - 24 mg/dL 11 12 -  Creatinine 0.57 - 1.00 mg/dL 0.98 1.02(H) -  Sodium 134 - 144 mmol/L 140 142 -  Potassium 3.5 - 5.2 mmol/L 4.4 4.3 -  Chloride 96 - 106 mmol/L 96 97 -  CO2 20 - 29 mmol/L 27 29 -  Calcium 8.7 - 10.2 mg/dL 9.7 9.0 -  Total Protein 6.0 - 8.5 g/dL - - 7.4  Total Bilirubin 0.0 - 1.2 mg/dL - - 0.2  Alkaline Phos 39 - 117 IU/L - - 111  AST 0 - 40 IU/L - - 32  ALT 0 - 32 IU/L - - 19   Lipid Panel     Component Value Date/Time   CHOL 123 01/17/2018 0948   TRIG 100 01/17/2018 0948   HDL 52 01/17/2018 0948   CHOLHDL 2.4 01/17/2018 0948   CHOLHDL 2.9 07/20/2014 1214   VLDL 21 07/20/2014 1214   LDLCALC 51 01/17/2018 0948    Vitals with BMI 04/23/2018  Height 5\' 10"   Weight 267 lbs  BMI 82.99  Systolic   Diastolic   Pulse   Respirations     Preferred Learning Style:  Visual, verbal, hands on.  Learning Readiness:  Ready  Change in progress   MEDICATIONS:   DIETARY INTAKE:  24-hr recall:  B ( AM): eggs, bacon, water  Snk ( AM): L ( PM): skipped. Snk ( PM):  D ( PM): baked chicken, string breans,  Water, Snk ( PM):  Beverages: water   Usual physical activity:ADL   Estimated energy needs: 1200  calories 135 g carbohydrates 90 g protein 33 g fat  Progress Towards Goal(s):  In progress.   Nutritional Diagnosis:  NB-1.1 Food and nutrition-related knowledge deficit As related to Diabetes.  As evidenced by  A1C 6.6%..    Intervention:  Nutrition  Nutrition and Diabetes education provided on My Plate, CHO counting, meal planning, portion sizes, timing of meals, avoiding snacks between meals unless having a low blood sugar, target ranges for A1C and blood sugars, signs/symptoms and treatment of hyper/hypoglycemia, monitoring blood sugars, taking medications as prescribed, benefits of exercising 30 minutes per day and prevention of complications of DM. HIgh Fiber Foods.  Goals 1. Get Aide to help prepare meals 2. Don't skip meals 3. Eat three  Meals a day 4. Walk in halls more often and walk in house for 15 minutes a day. 5. Lose 5-10 lbs in the next 2-3 months Get A1 Below 6.5%  Teaching Method Utilized:  Visual Auditory Hands on  Handouts given during visit include:  The Plate Method   Meal Plan Card  Diabetes Instrucitons.  Barriers to learning/adherence to lifestyle change: CVA, HA, Limited mobility.  Demonstrated degree of understanding via:  Teach Back   Monitoring/Evaluation:  Dietary intake, exercise, , and body weight in 3 month(s)..ndmm.

## 2018-05-02 ENCOUNTER — Encounter: Payer: Self-pay | Admitting: Nutrition

## 2018-05-14 ENCOUNTER — Other Ambulatory Visit: Payer: Self-pay | Admitting: Gastroenterology

## 2018-05-14 ENCOUNTER — Other Ambulatory Visit: Payer: Self-pay | Admitting: Family Medicine

## 2018-05-14 NOTE — Telephone Encounter (Signed)
Ok rf times 6 each

## 2018-05-15 ENCOUNTER — Ambulatory Visit (INDEPENDENT_AMBULATORY_CARE_PROVIDER_SITE_OTHER): Payer: Medicare Other | Admitting: Family Medicine

## 2018-05-15 ENCOUNTER — Encounter: Payer: Self-pay | Admitting: Family Medicine

## 2018-05-15 VITALS — BP 132/78 | Temp 98.3°F | Ht 70.0 in | Wt 261.0 lb

## 2018-05-15 DIAGNOSIS — H60501 Unspecified acute noninfective otitis externa, right ear: Secondary | ICD-10-CM

## 2018-05-15 MED ORDER — NEOMYCIN-POLYMYXIN-HC 3.5-10000-1 OT SOLN: 4.0000 [drp] | Freq: Four times a day (QID) | OTIC | 1 refills | Status: AC

## 2018-05-15 NOTE — Progress Notes (Signed)
   Subjective:    Patient ID: [name], female    DOB: [birth year], 57 y.o.   MRN: [id number]  HPI  Pt here today for bilateral ear irritation. Pt states she has had some yellow pus coming out of right ear but both ears hurt. Has been ongoing x 1 month. Also reports stuffy nose and cough for same length of time. Reports feeling hot last night, but no known temperature.    Review of Systems  Constitutional: Negative for fever.  HENT: Positive for congestion, ear discharge and ear pain. Negative for sore throat.   Eyes: Negative for discharge.  Respiratory: Positive for cough.        Objective:   Physical Exam  Constitutional: She appears well-developed and well-nourished. No distress.  HENT:  Head: Normocephalic and atraumatic.  Right Ear: Tympanic membrane normal. There is tenderness (inflammation to superior aspect of canal).  Left Ear: Tympanic membrane normal. No swelling or tenderness.  Nose: Nose normal.  Mouth/Throat: Oropharynx is clear and moist.  Eyes: Right eye exhibits no discharge. Left eye exhibits no discharge.  Neck: Neck supple.  Cardiovascular: Normal rate, regular rhythm and normal heart sounds.  Pulmonary/Chest: Effort normal and breath sounds normal. No respiratory distress. She has no wheezes.  Lymphadenopathy:    She has no cervical adenopathy.  Neurological: She is alert.  Skin: Skin is warm and dry.  Nursing note and vitals reviewed.     Assessment & Plan:  Acute otitis externa of right ear, unspecified type  5 day course of cortisporin otic drops to right ear, may repeat x 1 if necessary. Will recheck her ears at her next f/u appt on 05/28/18. If symptoms have not improved at that time will refer to ENT.  Dr. Sallee Lange was consulted on this case and is in agreement with the above treatment plan.  As attending physician to this patient visit, this patient was seen in conjunction with the nurse practitioner.  The history,physical and treatment  plan was reviewed with the nurse practitioner and pertinent findings were verified with the patient.  Also the treatment plan was reviewed with the patient while they were present. SAL

## 2018-05-28 ENCOUNTER — Ambulatory Visit (INDEPENDENT_AMBULATORY_CARE_PROVIDER_SITE_OTHER): Payer: Medicare Other | Admitting: Family Medicine

## 2018-05-28 VITALS — BP 132/82 | Ht 70.0 in | Wt 263.4 lb

## 2018-05-28 DIAGNOSIS — I10 Essential (primary) hypertension: Secondary | ICD-10-CM | POA: Diagnosis not present

## 2018-05-28 DIAGNOSIS — E785 Hyperlipidemia, unspecified: Secondary | ICD-10-CM

## 2018-05-28 DIAGNOSIS — E119 Type 2 diabetes mellitus without complications: Secondary | ICD-10-CM | POA: Diagnosis not present

## 2018-05-28 LAB — POCT GLYCOSYLATED HEMOGLOBIN (HGB A1C): HEMOGLOBIN A1C: 6.1 % — AB (ref 4.0–5.6)

## 2018-05-28 NOTE — Patient Instructions (Addendum)
Results for orders placed or performed in visit on 05/28/18  POCT glycosylated hemoglobin (Hb A1C)  Result Value Ref Range   Hemoglobin A1C 6.1 (A) 4.0 - 5.6 %   HbA1c POC (<> result, manual entry)     HbA1c, POC (prediabetic range)     HbA1c, POC (controlled diabetic range)      Diabetes Mellitus and Nutrition When you have diabetes (diabetes mellitus), it is very important to have healthy eating habits because your blood sugar (glucose) levels are greatly affected by what you eat and drink. Eating healthy foods in the appropriate amounts, at about the same times every day, can help you:  Control your blood glucose.  Lower your risk of heart disease.  Improve your blood pressure.  Reach or maintain a healthy weight.  Every person with diabetes is different, and each person has different needs for a meal plan. Your health care provider may recommend that you work with a diet and nutrition specialist (dietitian) to make a meal plan that is best for you. Your meal plan may vary depending on factors such as:  The calories you need.  The medicines you take.  Your weight.  Your blood glucose, blood pressure, and cholesterol levels.  Your activity level.  Other health conditions you have, such as heart or kidney disease.  How do carbohydrates affect me? Carbohydrates affect your blood glucose level more than any other type of food. Eating carbohydrates naturally increases the amount of glucose in your blood. Carbohydrate counting is a method for keeping track of how many carbohydrates you eat. Counting carbohydrates is important to keep your blood glucose at a healthy level, especially if you use insulin or take certain oral diabetes medicines. It is important to know how many carbohydrates you can safely have in each meal. This is different for every person. Your dietitian can help you calculate how many carbohydrates you should have at each meal and for snack. Foods that contain  carbohydrates include:  Bread, cereal, rice, pasta, and crackers.  Potatoes and corn.  Peas, beans, and lentils.  Milk and yogurt.  Fruit and juice.  Desserts, such as cakes, cookies, ice cream, and candy.  How does alcohol affect me? Alcohol can cause a sudden decrease in blood glucose (hypoglycemia), especially if you use insulin or take certain oral diabetes medicines. Hypoglycemia can be a life-threatening condition. Symptoms of hypoglycemia (sleepiness, dizziness, and confusion) are similar to symptoms of having too much alcohol. If your health care provider says that alcohol is safe for you, follow these guidelines:  Limit alcohol intake to no more than 1 drink per day for nonpregnant women and 2 drinks per day for men. One drink equals 12 oz of beer, 5 oz of wine, or 1 oz of hard liquor.  Do not drink on an empty stomach.  Keep yourself hydrated with water, diet soda, or unsweetened iced tea.  Keep in mind that regular soda, juice, and other mixers may contain a lot of sugar and must be counted as carbohydrates.  What are tips for following this plan? Reading food labels  Start by checking the serving size on the label. The amount of calories, carbohydrates, fats, and other nutrients listed on the label are based on one serving of the food. Many foods contain more than one serving per package.  Check the total grams (g) of carbohydrates in one serving. You can calculate the number of servings of carbohydrates in one serving by dividing the total carbohydrates by  15. For example, if a food has 30 g of total carbohydrates, it would be equal to 2 servings of carbohydrates.  Check the number of grams (g) of saturated and trans fats in one serving. Choose foods that have low or no amount of these fats.  Check the number of milligrams (mg) of sodium in one serving. Most people should limit total sodium intake to less than 2,300 mg per day.  Always check the nutrition  information of foods labeled as "low-fat" or "nonfat". These foods may be higher in added sugar or refined carbohydrates and should be avoided.  Talk to your dietitian to identify your daily goals for nutrients listed on the label. Shopping  Avoid buying canned, premade, or processed foods. These foods tend to be high in fat, sodium, and added sugar.  Shop around the outside edge of the grocery store. This includes fresh fruits and vegetables, bulk grains, fresh meats, and fresh dairy. Cooking  Use low-heat cooking methods, such as baking, instead of high-heat cooking methods like deep frying.  Cook using healthy oils, such as olive, canola, or sunflower oil.  Avoid cooking with butter, cream, or high-fat meats. Meal planning  Eat meals and snacks regularly, preferably at the same times every day. Avoid going long periods of time without eating.  Eat foods high in fiber, such as fresh fruits, vegetables, beans, and whole grains. Talk to your dietitian about how many servings of carbohydrates you can eat at each meal.  Eat 4-6 ounces of lean protein each day, such as lean meat, chicken, fish, eggs, or tofu. 1 ounce is equal to 1 ounce of meat, chicken, or fish, 1 egg, or 1/4 cup of tofu.  Eat some foods each day that contain healthy fats, such as avocado, nuts, seeds, and fish. Lifestyle   Check your blood glucose regularly.  Exercise at least 30 minutes 5 or more days each week, or as told by your health care provider.  Take medicines as told by your health care provider.  Do not use any products that contain nicotine or tobacco, such as cigarettes and e-cigarettes. If you need help quitting, ask your health care provider.  Work with a Social worker or diabetes educator to identify strategies to manage stress and any emotional and social challenges. What are some questions to ask my health care provider?  Do I need to meet with a diabetes educator?  Do I need to meet with a  dietitian?  What number can I call if I have questions?  When are the best times to check my blood glucose? Where to find more information:  American Diabetes Association: diabetes.org/food-and-fitness/food  Academy of Nutrition and Dietetics: PokerClues.dk  [employer] of Diabetes and Digestive and Kidney Diseases (NIH): ContactWire.be Summary  A healthy meal plan will help you control your blood glucose and maintain a healthy lifestyle.  Working with a diet and nutrition specialist (dietitian) can help you make a meal plan that is best for you.  Keep in mind that carbohydrates and alcohol have immediate effects on your blood glucose levels. It is important to count carbohydrates and to use alcohol carefully. This information is not intended to replace advice given to you by your health care provider. Make sure you discuss any questions you have with your health care provider. Document Released: 03/23/2005 Document Revised: 07/31/2016 Document Reviewed: 07/31/2016 Elsevier Interactive Patient Education  2018 [employer].  Steps to Quit Smoking Smoking tobacco can be bad for your health. It can also  affect almost every organ in your body. Smoking puts you and people around you at risk for many serious long-lasting (chronic) diseases. Quitting smoking is hard, but it is one of the best things that you can do for your health. It is never too late to quit. What are the benefits of quitting smoking? When you quit smoking, you lower your risk for getting serious diseases and conditions. They can include:  Lung cancer or lung disease.  Heart disease.  Stroke.  Heart attack.  Not being able to have children (infertility).  Weak bones (osteoporosis) and broken bones (fractures).  If you have coughing, wheezing, and shortness of breath, those symptoms may  get better when you quit. You may also get sick less often. If you are pregnant, quitting smoking can help to lower your chances of having a baby of low birth weight. What can I do to help me quit smoking? Talk with your doctor about what can help you quit smoking. Some things you can do (strategies) include:  Quitting smoking totally, instead of slowly cutting back how much you smoke over a period of time.  Going to in-person counseling. You are more likely to quit if you go to many counseling sessions.  Using resources and support systems, such as: ? Database administrator with a Social worker. ? Phone quitlines. ? Careers information officer. ? Support groups or group counseling. ? Text messaging programs. ? Mobile phone apps or applications.  Taking medicines. Some of these medicines may have nicotine in them. If you are pregnant or breastfeeding, do not take any medicines to quit smoking unless your doctor says it is okay. Talk with your doctor about counseling or other things that can help you.  Talk with your doctor about using more than one strategy at the same time, such as taking medicines while you are also going to in-person counseling. This can help make quitting easier. What things can I do to make it easier to quit? Quitting smoking might feel very hard at first, but there is a lot that you can do to make it easier. Take these steps:  Talk to your family and friends. Ask them to support and encourage you.  Call phone quitlines, reach out to support groups, or work with a Social worker.  Ask people who smoke to not smoke around you.  Avoid places that make you want (trigger) to smoke, such as: ? Bars. ? Parties. ? Smoke-break areas at work.  Spend time with people who do not smoke.  Lower the stress in your life. Stress can make you want to smoke. Try these things to help your stress: ? Getting regular exercise. ? Deep-breathing exercises. ? Yoga. ? Meditating. ? Doing a body  scan. To do this, close your eyes, focus on one area of your body at a time from head to toe, and notice which parts of your body are tense. Try to relax the muscles in those areas.  Download or buy apps on your mobile phone or tablet that can help you stick to your quit plan. There are many free apps, such as QuitGuide from the [employer] Office manager for Disease Control and Prevention). You can find more support from smokefree.gov and other websites.  This information is not intended to replace advice given to you by your health care provider. Make sure you discuss any questions you have with your health care provider. Document Released: 04/22/2009 Document Revised: 02/22/2016 Document Reviewed: 11/10/2014 Elsevier Interactive Patient Education  2018 [employer].

## 2018-05-28 NOTE — Progress Notes (Signed)
Subjective:    Patient ID: [name], female    DOB: Apr 28, 1961, 57 y.o.   MRN: [id number]  Diabetes  She presents for her follow-up diabetic visit. She has type 2 diabetes mellitus. Pertinent negatives for hypoglycemia include no confusion or dizziness. Pertinent negatives for diabetes include no chest pain, no fatigue, no polydipsia, no polyphagia and no weakness. Current diabetic treatment includes diet. She is compliant with treatment all of the time. Her weight is stable. She is following a diabetic diet.   Results for orders placed or performed in visit on 05/28/18  POCT glycosylated hemoglobin (Hb A1C)  Result Value Ref Range   Hemoglobin A1C 6.1 (A) 4.0 - 5.6 %   HbA1c POC (<> result, manual entry)     HbA1c, POC (prediabetic range)     HbA1c, POC (controlled diabetic range)    The patient's BMI is calculated.  The patient does have obesity.  The patient does try to some degree staying active and watching diet.  It is in the vital signs and acknowledged.  It is above the recommended BMI for the patient's height and weight.  The patient has been counseled regarding healthy diet, restricted portions, avoiding excessive carbohydrates/sugary foods, and increase physical activity as health permits.  It is in the patient's best interest to lower the risk of secondary illness including heart disease strokes and cancer by losing weight.  The patient acknowledges this information.  Patient here for follow-up regarding cholesterol.  The patient does have hyperlipidemia.  Patient does try to maintain a reasonable diet.  Patient does take the medication on a regular basis.  Denies missing a dose.  The patient denies any obvious side effects.  Prior blood work results reviewed with the patient.  The patient is aware of his cholesterol goals and the need to keep it under good control to lessen the risk of disease.  Patient for blood pressure check up.  The patient does have hypertension.  The  patient is on medication.  Patient relates compliance with meds. Todays BP reviewed with the patient. Patient denies issues with medication. Patient relates reasonable diet. Patient tries to minimize salt. Patient aware of BP goals.   Recheck on ear pain  Review of Systems  Constitutional: Negative for activity change, appetite change and fatigue.  HENT: Negative for congestion and rhinorrhea.   Respiratory: Negative for cough and shortness of breath.   Cardiovascular: Negative for chest pain and leg swelling.  Gastrointestinal: Negative for abdominal pain and diarrhea.  Endocrine: Negative for polydipsia and polyphagia.  Skin: Negative for color change.  Neurological: Negative for dizziness and weakness.  Psychiatric/Behavioral: Negative for behavioral problems and confusion.       Objective:   Physical Exam  Constitutional: She appears well-nourished. No distress.  HENT:  Head: Normocephalic and atraumatic.  Eyes: Right eye exhibits no discharge. Left eye exhibits no discharge.  Neck: No tracheal deviation present.  Cardiovascular: Normal rate, regular rhythm and normal heart sounds.  No murmur heard. Pulmonary/Chest: Effort normal and breath sounds normal. No respiratory distress.  Musculoskeletal: She exhibits no edema.  Lymphadenopathy:    She has no cervical adenopathy.  Neurological: She is alert. Coordination normal.  Skin: Skin is warm and dry.  Psychiatric: She has a normal mood and affect. Her behavior is normal.  Vitals reviewed.         Assessment & Plan:  Very nice patient but highly symptomatic Recent fall she bruised her back and her knee I  do not feel [name] needs any type of x-ray if she has ongoing trouble follow-up  Mild weakness and ataxia I told her she should always use a cane or a walker to walk around.  I do not find any evidence of a stroke.  Diabetes very good control watch diet maintain activity  Morbid obesity minimize caloric intake try to  lose weight if at all possible  Hyperlipidemia continue medication check labs periodically previous labs reviewed  Psychiatric concerns follow-up with Dr. Harrington Challenger  25 minutes was spent with the patient.  This statement verifies that 25 minutes was indeed spent with the patient.  More than 50% of this visit-total duration of the visit-was spent in counseling and coordination of care. The issues that the patient came in for today as reflected in the diagnosis (s) please refer to documentation for further details.  Follow-up again in 4 months

## 2018-06-04 ENCOUNTER — Other Ambulatory Visit: Payer: Self-pay | Admitting: Family Medicine

## 2018-06-07 DIAGNOSIS — M170 Bilateral primary osteoarthritis of knee: Secondary | ICD-10-CM | POA: Diagnosis not present

## 2018-06-14 ENCOUNTER — Ambulatory Visit (INDEPENDENT_AMBULATORY_CARE_PROVIDER_SITE_OTHER): Payer: Medicare Other | Admitting: Internal Medicine

## 2018-06-14 ENCOUNTER — Encounter: Payer: Self-pay | Admitting: Internal Medicine

## 2018-06-14 VITALS — BP 122/58 | HR 82 | Temp 97.4°F | Ht 70.0 in | Wt 264.4 lb

## 2018-06-14 DIAGNOSIS — R1310 Dysphagia, unspecified: Secondary | ICD-10-CM

## 2018-06-14 DIAGNOSIS — R1319 Other dysphagia: Secondary | ICD-10-CM

## 2018-06-14 DIAGNOSIS — K219 Gastro-esophageal reflux disease without esophagitis: Secondary | ICD-10-CM | POA: Diagnosis not present

## 2018-06-14 DIAGNOSIS — K588 Other irritable bowel syndrome: Secondary | ICD-10-CM | POA: Diagnosis not present

## 2018-06-14 NOTE — Patient Instructions (Signed)
Use dicyclomine as needed up to 4 times a day  As discussed, you likely have irritable bowel syndrome which is causing intermitant diarrhea  I do not think stool studies are needed at this time  Office visit in 4 months

## 2018-06-14 NOTE — Progress Notes (Signed)
Primary Care Physician:  Kathyrn Drown, MD Primary Gastroenterologist:  Dr. Gala Romney  Pre-Procedure History & Physical: HPI:  [name] is a 57 y.o. female here for follow-up of dysphagia and diarrhea.  Patient states dysphagia has improved.  She is careful how she swallows pills and food.  Really has not been a problem lately.  Continues on pantoprazole 40 mg twice daily.  History of diarrhea.  She states she gets to "runs" goes about 8 times a day every couple of weeks at other times she actually has somewhat hard bowel movements upwards of 3 daily.  Most of the time she does not have diarrhea.  She never did submit stool studies.  She feels that she gets the runs after dietary indiscretion.  She is managing fairly well with bowel function these days as well.  She takes dicyclomine which helps.  Past Medical History:  Diagnosis Date  . Anxiety   . Diabetes mellitus   . Diabetes mellitus, type II ([place])   . HTN (hypertension)   . Hyperglycemia   . IBS (irritable bowel syndrome)   . Lung nodules    right, followed by PCP, PET 11/2011  . Mental retardation   . MI (myocardial infarction) ([place])   . Migraines   . Sleep apnea     Past Surgical History:  Procedure Laterality Date  . ABDOMINAL HYSTERECTOMY    . COLONOSCOPY  11/2007   hyperplastic polyps, prior hx of adenomas   . COLONOSCOPY  05/2010   incomplete due to poor prep, hyperplastic rectal polyp  . COLONOSCOPY  05/05/2002   Dimunitive polyps in the rectum and left colon, cold    biopsied/removed.  Scattered few left-sided diverticula.  Regular colonic   mucosa appeared normal  . COLONOSCOPY N/A 05/28/2013   Rourk: mulitple tubular adenomas removed. next tcs 05/2016  . COLONOSCOPY WITH PROPOFOL N/A 09/07/2016   Dr. Gala Romney: For hyperplastic polyps removed. Next colonoscopy March 2023 given history of adenomatous colon polyps in the past.  . ESOPHAGOGASTRODUODENOSCOPY  08/2007   moderate sized hiatal hernia  .  ESOPHAGOGASTRODUODENOSCOPY  05/2010   noncritical schatzki ring s/p 11F  . ESOPHAGOGASTRODUODENOSCOPY (EGD) WITH ESOPHAGEAL DILATION N/A 02/06/2013   ZOX:WRUEAV esophagus-s/p dilation up to a 65 [place] size with [facility] dilators.  Hiatal hernia  . ESOPHAGOGASTRODUODENOSCOPY (EGD) WITH PROPOFOL N/A 09/07/2016   Dr. Gala Romney: Normal, status post empiric dilation of the esophagus for history of dysphagia  . EXTERNAL EAR SURGERY     bilateral  . FOOT SURGERY    . GLAUCOMA SURGERY    . MALONEY DILATION N/A 09/07/2016   Procedure: Venia Minks DILATION;  Surgeon: Daneil Dolin, MD;  [facility];  Service: Endoscopy;  Laterality: N/A;  . POLYPECTOMY  09/07/2016   Procedure: POLYPECTOMY;  Surgeon: Daneil Dolin, MD;  Location: [facility];  Service: Endoscopy;;  sigmoid colon x4  . small bowel capsule  10/2007   normal    Prior to Admission medications   Medication Sig Start Date End Date Taking? Authorizing Provider  atorvastatin (LIPITOR) 10 MG tablet TAKE 1 TABLET BY MOUTH ONCE A DAY. 05/15/18  Yes Kathyrn Drown, MD  benztropine (COGENTIN) 1 MG tablet Take 1 tablet (1 mg total) by mouth daily. 03/25/18  Yes Cloria Spring, MD  calcium-vitamin D (OSCAL WITH D) 500-200 MG-UNIT per tablet Take 1 tablet by mouth 3 (three) times daily.     Yes [provider]  chlorzoxazone (PARAFON FORTE [place])  500 MG tablet One qhs prn neck pain 11/20/17  Yes Luking, Scott A, MD  diclofenac (VOLTAREN) 75 MG EC tablet TAKE 1 TABLET BY MOUTH TWICE A DAY. 05/15/18  Yes Luking, Elayne Snare, MD  dicyclomine (BENTYL) 10 MG capsule TAKE 1 CAPSULE BY MOUTH FOUR TIMES DAILY BEFORE MEALS AND AT BEDTIME FOR LOOSE STOOLS.HOLD FOR CONSTIPATION 05/15/18  Yes Carlis Stable, NP  lamoTRIgine (LAMICTAL) 100 MG tablet Take 1 tablet (100 mg total) by mouth 2 (two) times daily. 03/25/18  Yes Cloria Spring, MD  lisinopril (PRINIVIL,ZESTRIL) 2.5 MG tablet TAKE (1) TABLET BY MOUTH EACH MORNING. 09/13/17  Yes Luking, Scott A, MD    lisinopril (PRINIVIL,ZESTRIL) 2.5 MG tablet TAKE (1) TABLET BY MOUTH EACH MORNING. 04/17/18  Yes Luking, Scott A, MD  lisinopril (PRINIVIL,ZESTRIL) 2.5 MG tablet TAKE (1) TABLET BY MOUTH EACH MORNING. 06/04/18  Yes Luking, Scott A, MD  paliperidone (INVEGA) 9 MG 24 hr tablet Take 1 tablet (9 mg total) by mouth daily. 03/25/18  Yes Cloria Spring, MD  pantoprazole (PROTONIX) 40 MG tablet TAKE 1 TABLET BY MOUTH TWICE DAILY, 30 MINUTES BEFORE MEALS. 05/15/18  Yes Carlis Stable, NP  potassium chloride (K-DUR) 10 MEQ tablet TAKE 1 TABLET BY MOUTH TWICE A DAY. 12/12/16  Yes Mikey Kirschner, MD  propranolol (INDERAL) 10 MG tablet TAKE 1 TABLET BY MOUTH THREE TIMES A DAY. 12/12/16  Yes Mikey Kirschner, MD  sertraline (ZOLOFT) 100 MG tablet Take 1 tablet (100 mg total) by mouth daily. 03/25/18 03/25/19 Yes Cloria Spring, MD  torsemide (DEMADEX) 20 MG tablet TAKE (1) TABLET BY MOUTH EACH MORNING. 04/17/18  Yes Kathyrn Drown, MD  torsemide (DEMADEX) 20 MG tablet TAKE (1) TABLET BY MOUTH EACH MORNING. 06/04/18  Yes Kathyrn Drown, MD  traZODone (DESYREL) 100 MG tablet TAKE 2 TABLETS(200MG ) BY MOUTH AT BEDTIME. 03/25/18  Yes Cloria Spring, MD    Allergies as of 06/14/2018 - Review Complete 06/14/2018  Allergen Reaction Noted  . Thorazine [chlorpromazine hcl] Anaphylaxis 10/20/2010  . Acetaminophen Other (See Comments)   . Aspirin Other (See Comments)   . Aspirin-acetaminophen-caffeine Other (See Comments)   . Nsaids Nausea And Vomiting   . Penicillins Nausea And Vomiting   . Tomato Rash 01/17/2012    Family History  Problem Relation Age of Onset  . Stroke Mother   . Heart attack Father   . Schizophrenia Other   . Drug abuse Other   . Alcohol abuse Other   . Colon cancer Unknown        aunt  . Obesity Unknown   . COPD Unknown   . GER disease Unknown   . Diabetes type II Unknown   . Anxiety disorder Unknown   . Depression Unknown   . Depression Sister   . Schizophrenia Sister   . Liver  disease Neg Hx   . Inflammatory bowel disease Neg Hx     Social History   Socioeconomic History  . Marital status: Divorced    Spouse name: Not on file  . Number of children: Not on file  . Years of education: Not on file  . Highest education level: Not on file  Occupational History  . Occupation: disabled    Fish farm manager: UNEMPLOYED  Social Needs  . Financial resource strain: Not on file  . Food insecurity:    Worry: Not on file    Inability: Not on file  . Transportation needs:    Medical: Not on  file    Non-medical: Not on file  Tobacco Use  . Smoking status: Current Every Day Smoker    Packs/day: 0.25    Years: 40.00    Pack years: 10.00    Types: Cigarettes  . Smokeless tobacco: Never Used  Substance and Sexual Activity  . Alcohol use: No  . Drug use: No  . Sexual activity: Yes    Partners: Male    Birth control/protection: None    Comment: boyfriend  Lifestyle  . Physical activity:    Days per week: Not on file    Minutes per session: Not on file  . Stress: Not on file  Relationships  . Social connections:    Talks on phone: Not on file    Gets together: Not on file    Attends religious service: Not on file    Active member of club or organization: Not on file    Attends meetings of clubs or organizations: Not on file    Relationship status: Not on file  . Intimate partner violence:    Fear of current or ex partner: Not on file    Emotionally abused: Not on file    Physically abused: Not on file    Forced sexual activity: Not on file  Other Topics Concern  . Not on file  Social History Narrative  . Not on file    Review of Systems: See HPI, otherwise negative ROS  Physical Exam: BP (!) 122/58   Pulse 82   Temp (!) 97.4 F (36.3 C) (Oral)   Ht 5\' 10"  (1.778 m)   Wt 264 lb 6.4 oz (119.9 kg)   BMI 37.94 kg/m  General:   Alert, jovial lady accompanied by her Education officer, museum.   pleasant and cooperative in NAD Heart:  Regular rate and rhythm; no  murmurs, clicks, rubs,  or gallops. Abdomen: Non-distended, normal bowel sounds.  Soft and nontender without appreciable mass or hepatosplenomegaly.  Pulses:  Normal pulses noted. Extremities:  Without clubbing or edema.  Impression/Plan: 57 year old lady with longstanding GERD and dysphagia symptoms.  Prior work-up as chronicled in the record.  Dysphagia has markedly improved.  No longer an issue for this nice lady.  She has intermittent bouts of diarrhea punctuating relatively long periods of normal bowel function in fact, sometimes describes mild constipation.  Bowel dysfunction most likely secondary to irritable bowel syndrome and dietary indiscretion.  I feel the utility of stool studies at this point would be nil.  Recommendations:  Use dicyclomine as needed up to 4 times a day  As discussed, you likely have irritable bowel syndrome which is causing intermitant diarrhea  I do not think stool studies are needed at this time  Adhere to a carbohydrate modified, sensible diet  Office visit in 4 months   Notice: This dictation was prepared with Dragon dictation along with smaller phrase technology. Any transcriptional errors that result from this process are unintentional and may not be corrected upon review.

## 2018-06-18 ENCOUNTER — Ambulatory Visit: Payer: Self-pay | Admitting: Internal Medicine

## 2018-06-20 ENCOUNTER — Other Ambulatory Visit: Payer: Self-pay

## 2018-06-20 ENCOUNTER — Encounter (HOSPITAL_COMMUNITY): Payer: Self-pay | Admitting: Emergency Medicine

## 2018-06-20 ENCOUNTER — Telehealth: Payer: Self-pay | Admitting: Family Medicine

## 2018-06-20 ENCOUNTER — Emergency Department (HOSPITAL_COMMUNITY): Payer: Medicare Other

## 2018-06-20 ENCOUNTER — Emergency Department (HOSPITAL_COMMUNITY)
Admission: EM | Admit: 2018-06-20 | Discharge: 2018-06-20 | Disposition: A | Discharge status: Home / Self Care | Payer: Medicare Other | Attending: Emergency Medicine | Admitting: Emergency Medicine

## 2018-06-20 DIAGNOSIS — R51 Headache: Secondary | ICD-10-CM | POA: Diagnosis present

## 2018-06-20 DIAGNOSIS — J0100 Acute maxillary sinusitis, unspecified: Secondary | ICD-10-CM

## 2018-06-20 DIAGNOSIS — I10 Essential (primary) hypertension: Secondary | ICD-10-CM | POA: Diagnosis not present

## 2018-06-20 DIAGNOSIS — F79 Unspecified intellectual disabilities: Secondary | ICD-10-CM | POA: Diagnosis not present

## 2018-06-20 DIAGNOSIS — F17210 Nicotine dependence, cigarettes, uncomplicated: Secondary | ICD-10-CM | POA: Diagnosis not present

## 2018-06-20 DIAGNOSIS — E119 Type 2 diabetes mellitus without complications: Secondary | ICD-10-CM | POA: Insufficient documentation

## 2018-06-20 DIAGNOSIS — R0602 Shortness of breath: Secondary | ICD-10-CM | POA: Diagnosis not present

## 2018-06-20 LAB — URINALYSIS, ROUTINE W REFLEX MICROSCOPIC
BILIRUBIN URINE: NEGATIVE
Glucose, UA: NEGATIVE mg/dL
Ketones, ur: NEGATIVE mg/dL
LEUKOCYTES UA: NEGATIVE
Nitrite: NEGATIVE
PH: 5 (ref 5.0–8.0)
Protein, ur: NEGATIVE mg/dL
Specific Gravity, Urine: 1.01 (ref 1.005–1.030)

## 2018-06-20 MED ORDER — DOXYCYCLINE HYCLATE 100 MG PO CAPS: 100.0000 mg | ORAL_CAPSULE | Freq: Two times a day (BID) | ORAL | 0 refills | Status: AC

## 2018-06-20 MED ORDER — FLUTICASONE PROPIONATE 50 MCG/ACT NA SUSP: 2.0000 | Freq: Every day | NASAL | 2 refills | Status: DC

## 2018-06-20 NOTE — ED Triage Notes (Signed)
Patient complains of sinus congestion that started a couple weeks ago. She has not used any OTC medication to alleviate her symptoms. Patient states she vomited one time this morning and diarrhea 3xs yesterday and once today.

## 2018-06-20 NOTE — Telephone Encounter (Signed)
[name] called with [name] on a conference call  Pt states she's having a bad HA, nasal congestion, eye pain  Unknown if there's a fever  Pt states this has been going on for 2 weeks  Unable to come in (case worker can't bring her)  Please advise and call pt [phone]     [facility]

## 2018-06-20 NOTE — Telephone Encounter (Signed)
Per Dr.Steve pt may have an appt for tomorrow. Patient was instructed that if worsens over night to go to the ed. She states understanding and was transferred up front to schedule an appt for 06/21/2018.

## 2018-06-20 NOTE — Discharge Instructions (Signed)
You were seen in the ED with likely sinus infection. I am prescribing some medication to take as directed. Return to the ED with any new or worsening symptoms. Call your PCP to schedule a follow up appointment

## 2018-06-20 NOTE — ED Provider Notes (Signed)
Emergency Department Provider Note   I have reviewed the triage vital signs and the nursing notes.   HISTORY  Chief Complaint Nasal Congestion   HPI [name] is a 57 y.o. female with PMH of DM, HTN, IBS, and CAD is to the emergency department for evaluation of headache with sinus congestion.  Patient has had 2 weeks of persistent symptoms.  She feels pain and pressure in her face which is mostly behind the right eye and face.  She denies any cough but has had some mild shortness of breath.  The mucus is bloody at times.  She notes pain that shoots from her face down into her body but denies specific chest pain.  She has had diarrhea 3 times yesterday but none today.  She continues to eat and drink without difficulty.  No unilateral weakness or numbness. No fever or chills.   Past Medical History:  Diagnosis Date  . Anxiety   . Diabetes mellitus   . Diabetes mellitus, type II ([place])   . HTN (hypertension)   . Hyperglycemia   . IBS (irritable bowel syndrome)   . Lung nodules    right, followed by PCP, PET 11/2011  . Mental retardation   . MI (myocardial infarction) ([place])   . Migraines   . Sleep apnea     Patient Active Problem List   Diagnosis Date Noted  . Hyperlipidemia 09/26/2017  . Dysphagia 08/15/2016  . Obstructive sleep apnea syndrome 06/27/2016  . Rectal bleeding 04/07/2013  . Melena 03/04/2013  . Hematemesis 03/04/2013  . Abdominal pain, epigastric 03/04/2013  . Hypokalemia 03/04/2013  . Smoker 01/16/2012  . Diarrhea 12/26/2011  . Pulmonary nodule, right 12/26/2011  . Abdominal pain 10/08/2011  . Fatty liver 08/24/2011  . Constipation 10/20/2010  . NAUSEA AND VOMITING 06/09/2010  . HEMATOCHEZIA 04/21/2010  . OTHER DYSPHAGIA 04/21/2010  . MILD MENTAL RETARDATION 02/17/2009  . GLAUCOMA 02/17/2009  . Diabetes type 2, controlled ([place]) 01/15/2009  . Morbid obesity ([place]) 01/15/2009  . ANXIETY 01/15/2009  . UNSPECIFIED MENTAL RETARDATION 01/15/2009  .  MIGRAINE HEADACHE 01/15/2009  . Essential hypertension 01/15/2009  . HEMORRHOIDS 01/15/2009  . GASTROESOPHAGEAL REFLUX DISEASE, CHRONIC 01/15/2009  . CONSTIPATION, CHRONIC 01/15/2009  . IBS 01/15/2009  . KNEE PAIN, CHRONIC 01/15/2009  . BACK PAIN, CHRONIC 01/15/2009  . Hx of adenomatous colonic polyps 01/15/2009    Past Surgical History:  Procedure Laterality Date  . ABDOMINAL HYSTERECTOMY    . COLONOSCOPY  11/2007   hyperplastic polyps, prior hx of adenomas   . COLONOSCOPY  05/2010   incomplete due to poor prep, hyperplastic rectal polyp  . COLONOSCOPY  05/05/2002   Dimunitive polyps in the rectum and left colon, cold    biopsied/removed.  Scattered few left-sided diverticula.  Regular colonic   mucosa appeared normal  . COLONOSCOPY N/A 05/28/2013   Rourk: mulitple tubular adenomas removed. next tcs 05/2016  . COLONOSCOPY WITH PROPOFOL N/A 09/07/2016   Dr. Gala Romney: For hyperplastic polyps removed. Next colonoscopy March 2023 given history of adenomatous colon polyps in the past.  . ESOPHAGOGASTRODUODENOSCOPY  08/2007   moderate sized hiatal hernia  . ESOPHAGOGASTRODUODENOSCOPY  05/2010   noncritical schatzki ring s/p 62F  . ESOPHAGOGASTRODUODENOSCOPY (EGD) WITH ESOPHAGEAL DILATION N/A 02/06/2013   UKG:URKYHC esophagus-s/p dilation up to a 5 [place] size with [facility] dilators.  Hiatal hernia  . ESOPHAGOGASTRODUODENOSCOPY (EGD) WITH PROPOFOL N/A 09/07/2016   Dr. Gala Romney: Normal, status post empiric dilation of the esophagus for history of dysphagia  .  EXTERNAL EAR SURGERY     bilateral  . FOOT SURGERY    . GLAUCOMA SURGERY    . MALONEY DILATION N/A 09/07/2016   Procedure: Venia Minks DILATION;  Surgeon: Daneil Dolin, MD;  [facility];  Service: Endoscopy;  Laterality: N/A;  . POLYPECTOMY  09/07/2016   Procedure: POLYPECTOMY;  Surgeon: Daneil Dolin, MD;  [facility];  Service: Endoscopy;;  sigmoid colon x4  . small bowel capsule  10/2007   normal     Allergies Thorazine [chlorpromazine hcl]; Acetaminophen; Aspirin; Aspirin-acetaminophen-caffeine; Nsaids; Penicillins; and Tomato  Family History  Problem Relation Age of Onset  . Stroke Mother   . Heart attack Father   . Schizophrenia Other   . Drug abuse Other   . Alcohol abuse Other   . Colon cancer Other        aunt  . Obesity Other   . COPD Other   . GER disease Other   . Diabetes type II Other   . Anxiety disorder Other   . Depression Other   . Depression Sister   . Schizophrenia Sister   . Liver disease Neg Hx   . Inflammatory bowel disease Neg Hx     Social History Social History   Tobacco Use  . Smoking status: Current Every Day Smoker    Packs/day: 0.25    Years: 40.00    Pack years: 10.00    Types: Cigarettes  . Smokeless tobacco: Never Used  Substance Use Topics  . Alcohol use: No  . Drug use: No    Review of Systems  Constitutional: No fever/chills Eyes: No visual changes. ENT: No sore throat. Positive face pain and congestion.  Cardiovascular: Denies chest pain. Respiratory: Denies shortness of breath. Gastrointestinal: No abdominal pain.  No nausea, no vomiting. Positive diarrhea.  No constipation. Genitourinary: Positive for dysuria. Musculoskeletal: Negative for back pain. Skin: Negative for rash. Neurological: Negative for headaches, focal weakness or numbness.  10-point ROS otherwise negative.  ____________________________________________   PHYSICAL EXAM:  VITAL SIGNS: ED Triage Vitals  Enc Vitals Group     BP 06/20/18 1528 (!) 104/59     Pulse Rate 06/20/18 1528 75     Resp 06/20/18 1528 18     Temp 06/20/18 1528 98 F (36.7 C)     Temp Source 06/20/18 1528 Oral     SpO2 06/20/18 1528 98 %     Weight 06/20/18 1533 240 lb (108.9 kg)     Height 06/20/18 1533 5\' 8"  (1.727 m)     Pain Score 06/20/18 1532 10   Constitutional: Alert and oriented. Well appearing and in no acute distress. Eyes: Conjunctivae are normal. Head:  Atraumatic. Nose: Positive congestion/rhinnorhea. Positive tenderness over the right maxillary sinus.  Mouth/Throat: Mucous membranes are moist.   Neck: No stridor. Cardiovascular: Normal rate, regular rhythm. Good peripheral circulation. Grossly normal heart sounds.   Respiratory: Normal respiratory effort.  No retractions. Lungs CTAB. Gastrointestinal: Soft and nontender. No distention.  Musculoskeletal: No lower extremity tenderness nor edema. No gross deformities of extremities. Neurologic:  Normal speech and language. No gross focal neurologic deficits are appreciated.  Skin:  Skin is warm, dry and intact. No rash noted.   ____________________________________________   LABS (all labs ordered are listed, but only abnormal results are displayed)  Labs Reviewed  URINALYSIS, ROUTINE W REFLEX MICROSCOPIC - Abnormal; Notable for the following components:      Result Value   APPearance HAZY (*)  Hgb urine dipstick SMALL (*)    Bacteria, UA RARE (*)    All other components within normal limits   ____________________________________________  RADIOLOGY  Dg Chest 2 View  Result Date: 06/20/2018 CLINICAL DATA:  Short of breath EXAM: CHEST - 2 VIEW COMPARISON:  CT chest 10/04/2017 FINDINGS: Heart size and vascularity normal. Negative for pneumonia. Negative for infiltrate or effusion Right middle lobe nodule seen on prior CT not well visualized on chest x-ray. IMPRESSION: No active cardiopulmonary disease. Electronically Signed   By: Franchot Gallo M.D.   On: 06/20/2018 17:13    ____________________________________________   PROCEDURES  Procedure(s) performed:   Procedures  None ____________________________________________   INITIAL IMPRESSION / ASSESSMENT AND PLAN / ED COURSE  Pertinent labs & imaging results that were available during my care of the patient were reviewed by me and considered in my medical decision making (see chart for details).  Patient presents to  the emergency department with primarily congestion related symptoms.  No fevers.  Mild shortness of breath.  Given the congestion plan for chest x-ray along with urinalysis given her diarrhea symptoms and mild dysuria.   Labs and CXR reviewed. Plan for sinusitis treatment with abx given two weeks of symptoms and focal sinus tenderness on exam. Discussed PCP follow up plan and ED return precautions.  ____________________________________________  FINAL CLINICAL IMPRESSION(S) / ED DIAGNOSES  Final diagnoses:  Acute non-recurrent maxillary sinusitis    NEW OUTPATIENT MEDICATIONS STARTED DURING THIS VISIT:  Discharge Medication List as of 06/20/2018  6:04 PM    START taking these medications   Details  doxycycline (VIBRAMYCIN) 100 MG capsule Take 1 capsule (100 mg total) by mouth 2 (two) times daily for 7 days., Starting Thu 06/20/2018, Until Thu 06/27/2018, Print    fluticasone (FLONASE) 50 MCG/ACT nasal spray Place 2 sprays into both nostrils daily for 7 days., Starting Thu 06/20/2018, Until Thu 06/27/2018, Print        Note:  This document was prepared using Dragon voice recognition software and may include unintentional dictation errors.  Nanda Quinton, MD Emergency Medicine    Chadrick Sprinkle, [facility], MD 06/21/18 [phone]

## 2018-06-21 ENCOUNTER — Ambulatory Visit: Payer: Medicare Other | Admitting: Family Medicine

## 2018-06-24 ENCOUNTER — Encounter (HOSPITAL_COMMUNITY): Payer: Self-pay | Admitting: Psychiatry

## 2018-06-24 ENCOUNTER — Ambulatory Visit (INDEPENDENT_AMBULATORY_CARE_PROVIDER_SITE_OTHER): Payer: Medicare Other | Admitting: Psychiatry

## 2018-06-24 VITALS — BP 116/76 | HR 77 | Ht 68.0 in | Wt 265.0 lb

## 2018-06-24 DIAGNOSIS — F251 Schizoaffective disorder, depressive type: Secondary | ICD-10-CM | POA: Diagnosis not present

## 2018-06-24 MED ORDER — SERTRALINE HCL 100 MG PO TABS: 100.0000 mg | ORAL_TABLET | Freq: Every day | ORAL | 2 refills | Status: DC

## 2018-06-24 MED ORDER — LAMOTRIGINE 100 MG PO TABS: 100.0000 mg | ORAL_TABLET | Freq: Two times a day (BID) | ORAL | 2 refills | Status: DC

## 2018-06-24 MED ORDER — TRAZODONE HCL 100 MG PO TABS: ORAL_TABLET | ORAL | 2 refills | Status: DC

## 2018-06-24 MED ORDER — PALIPERIDONE ER 9 MG PO TB24: 9.0000 mg | ORAL_TABLET | Freq: Every day | ORAL | 2 refills | Status: DC

## 2018-06-24 MED ORDER — BENZTROPINE MESYLATE 1 MG PO TABS: 1.0000 mg | ORAL_TABLET | Freq: Every day | ORAL | 2 refills | Status: DC
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
## 2018-07-01 ENCOUNTER — Encounter: Payer: Self-pay | Admitting: Family Medicine

## 2018-07-01 ENCOUNTER — Ambulatory Visit (INDEPENDENT_AMBULATORY_CARE_PROVIDER_SITE_OTHER): Payer: Medicare Other | Admitting: Family Medicine

## 2018-07-01 VITALS — BP 132/90 | Wt 259.4 lb

## 2018-07-01 DIAGNOSIS — J069 Acute upper respiratory infection, unspecified: Secondary | ICD-10-CM

## 2018-07-01 DIAGNOSIS — R0981 Nasal congestion: Secondary | ICD-10-CM | POA: Diagnosis not present

## 2018-07-01 NOTE — Progress Notes (Signed)
   Subjective:    Patient ID: [name], female    DOB: [birth year], 57 y.o.   MRN: [id number]  HPI Pt here today for hospital follow up. Pt went to ED on 06/20/18 for headache with sinus congestion. Pt states that her head and nostrils hurt. Pt states that ER doctor told her that she had a clog in the nose and pus in head.  Patient recent head congestion chest congestion and sinus pressure treated with antibiotics started to feel better now denies high fever chills sweats wheezing difficulty breathing  Review of Systems  Constitutional: Negative for activity change and fever.  HENT: Positive for congestion and rhinorrhea. Negative for ear pain.   Eyes: Negative for discharge.  Respiratory: Positive for cough. Negative for shortness of breath and wheezing.   Cardiovascular: Negative for chest pain.       Objective:   Physical Exam Vitals signs and nursing note reviewed.  Constitutional:      Appearance: She is well-developed.  HENT:     Head: Normocephalic.     Nose: Nose normal.     Mouth/Throat:     Pharynx: No oropharyngeal exudate.  Neck:     Musculoskeletal: Neck supple.  Cardiovascular:     Rate and Rhythm: Normal rate.     Heart sounds: Normal heart sounds. No murmur.  Pulmonary:     Effort: Pulmonary effort is normal.     Breath sounds: Normal breath sounds. No wheezing.  Lymphadenopathy:     Cervical: No cervical adenopathy.  Skin:    General: Skin is warm and dry.           Assessment & Plan:  Viral illness Resolving No further antibiotics Sinus congestion environmental use humidifier Patient to follow-up 3 to 4 months

## 2018-07-15 ENCOUNTER — Other Ambulatory Visit: Payer: Self-pay | Admitting: Nurse Practitioner

## 2018-07-15 ENCOUNTER — Other Ambulatory Visit: Payer: Self-pay | Admitting: Family Medicine

## 2018-07-16 ENCOUNTER — Telehealth: Payer: Self-pay | Admitting: Family Medicine

## 2018-07-16 NOTE — Telephone Encounter (Signed)
Pt's case manager [name] dropped off FL2 form to be filled out. Form has been placed in nurses box at nurse station.  [name]'s CB# [phone]

## 2018-07-16 NOTE — Telephone Encounter (Signed)
Med list attached and put in dr scott's folder

## 2018-07-18 NOTE — Telephone Encounter (Signed)
This form was filled out

## 2018-07-19 NOTE — Telephone Encounter (Signed)
Form is upfront for pickup.

## 2018-07-24 ENCOUNTER — Ambulatory Visit: Payer: Self-pay | Admitting: Nutrition

## 2018-07-31 ENCOUNTER — Ambulatory Visit: Payer: Self-pay | Admitting: Nutrition

## 2018-08-05 DIAGNOSIS — E119 Type 2 diabetes mellitus without complications: Secondary | ICD-10-CM | POA: Diagnosis not present

## 2018-08-05 DIAGNOSIS — H04123 Dry eye syndrome of bilateral lacrimal glands: Secondary | ICD-10-CM | POA: Diagnosis not present

## 2018-08-05 DIAGNOSIS — H5203 Hypermetropia, bilateral: Secondary | ICD-10-CM | POA: Diagnosis not present

## 2018-08-05 DIAGNOSIS — H52223 Regular astigmatism, bilateral: Secondary | ICD-10-CM | POA: Diagnosis not present

## 2018-08-06 DIAGNOSIS — H5213 Myopia, bilateral: Secondary | ICD-10-CM | POA: Diagnosis not present

## 2018-08-08 ENCOUNTER — Other Ambulatory Visit: Payer: Self-pay | Admitting: *Deleted

## 2018-08-08 DIAGNOSIS — R911 Solitary pulmonary nodule: Secondary | ICD-10-CM

## 2018-08-13 ENCOUNTER — Other Ambulatory Visit: Payer: Self-pay | Admitting: Family Medicine

## 2018-08-19 ENCOUNTER — Encounter: Payer: Medicare Other | Attending: Family Medicine | Admitting: Nutrition

## 2018-08-19 ENCOUNTER — Encounter: Payer: Self-pay | Admitting: Nutrition

## 2018-08-19 DIAGNOSIS — R739 Hyperglycemia, unspecified: Secondary | ICD-10-CM | POA: Diagnosis not present

## 2018-08-19 NOTE — Progress Notes (Signed)
Medical Nutrition Therapy:  Appt start time: 1330 end time:  1400     Assessment:  Primary concerns today: Diabetes Type 2. F/u  Here with her caseworker. [name].  Has been drinking a lot more water and eating more vegetables.  Has stopped eating sweets and cutting out bread. Sometimes skips meals. Has been exercising in her house.  Went to see eye dr  And eyes were reported to be good. Going to get glasses torwards end of this month. Sees foot dr every three months.  ] No longer on Metformin.A1C 6.1% and will get it rechecked at next PCP visit in March 2020. Gets dizzy at times. Has fallen a few times at home in the bathroom and had to use her help alert. Didn't go to ER she reports because she wouldn't have a way to get back home.   Motivated to continue to lose weight. Wants to get under 200 lbs.   Lab Results  Component Value Date   HGBA1C 6.1 (A) 05/28/2018   CMP Latest Ref Rng & Units 02/28/2018 01/17/2018 06/08/2017  Glucose 65 - 99 mg/dL 89 107(H) -  BUN 6 - 24 mg/dL 11 12 -  Creatinine 0.57 - 1.00 mg/dL 0.98 1.02(H) -  Sodium 134 - 144 mmol/L 140 142 -  Potassium 3.5 - 5.2 mmol/L 4.4 4.3 -  Chloride 96 - 106 mmol/L 96 97 -  CO2 20 - 29 mmol/L 27 29 -  Calcium 8.7 - 10.2 mg/dL 9.7 9.0 -  Total Protein 6.0 - 8.5 g/dL - - 7.4  Total Bilirubin 0.0 - 1.2 mg/dL - - 0.2  Alkaline Phos 39 - 117 IU/L - - 111  AST 0 - 40 IU/L - - 32  ALT 0 - 32 IU/L - - 19   Lipid Panel     Component Value Date/Time   CHOL 123 01/17/2018 0948   TRIG 100 01/17/2018 0948   HDL 52 01/17/2018 0948   CHOLHDL 2.4 01/17/2018 0948   CHOLHDL 2.9 07/20/2014 1214   VLDL 21 07/20/2014 1214   LDLCALC 51 01/17/2018 0948    Vitals with BMI 04/23/2018  Height 5\' 10"   Weight 267 lbs  BMI 78.46  Systolic   Diastolic   Pulse   Respirations     Preferred Learning Style:  Visual, verbal, hands on.  Learning Readiness:  Ready  Change in progress   MEDICATIONS:   DIETARY  INTAKE:  24-hr recall:  B ( AM): eggs 2,  [place] sausage.  water Snk ( AM): L ( PM): Mashed potatoes and gravy, baked steak, water  D ( PM): Baked steak, green beans and mashed potatoes water Snk ( PM):  Beverages: water   Usual physical activity walking some   Estimated energy needs: 1200  calories 135 g carbohydrates 90 g protein 33 g fat  Progress Towards Goal(s):  In progress.   Nutritional Diagnosis:  NB-1.1 Food and nutrition-related knowledge deficit As related to Diabetes.  As evidenced by  A1C 6.6%..    Intervention:  Nutrition Nutrition and Diabetes education provided on My Plate, CHO counting, meal planning, portion sizes, timing of meals, avoiding snacks between meals unless having a low blood sugar, target ranges for A1C and blood sugars, signs/symptoms and treatment of hyper/hypoglycemia, monitoring blood sugars, taking medications as prescribed, benefits of exercising 30 minutes per day and prevention of complications of DM. HIgh Fiber Foods. Goals . Exercise 30 minutes daily of walking   Lose 3-5 lbs per month  Teaching Method Utilized:  Visual Auditory Hands on  Handouts given during visit include:  The Plate Method   Meal Plan Card  Diabetes Instrucitons.  Barriers to learning/adherence to lifestyle change: CVA, HA, Limited mobility.  Demonstrated degree of understanding via:  Teach Back   Monitoring/Evaluation:  Dietary intake, exercise, , and body weight in 3 month(s).Marland Kitchen

## 2018-08-19 NOTE — Patient Instructions (Addendum)
Goals . Exercise 30 minutes daily of walking   Lose 3-5 lbs per month  Don;t skip meals. Take shower when aide is there for assistance if needed.

## 2018-09-05 ENCOUNTER — Ambulatory Visit: Payer: Medicare Other | Admitting: Family Medicine

## 2018-09-05 DIAGNOSIS — H52223 Regular astigmatism, bilateral: Secondary | ICD-10-CM | POA: Diagnosis not present

## 2018-09-05 DIAGNOSIS — H524 Presbyopia: Secondary | ICD-10-CM | POA: Diagnosis not present

## 2018-09-06 ENCOUNTER — Other Ambulatory Visit: Payer: Self-pay | Admitting: Family Medicine

## 2018-09-12 ENCOUNTER — Ambulatory Visit (INDEPENDENT_AMBULATORY_CARE_PROVIDER_SITE_OTHER): Payer: Medicare Other | Admitting: Family Medicine

## 2018-09-12 ENCOUNTER — Other Ambulatory Visit: Payer: Self-pay | Admitting: Family Medicine

## 2018-09-12 ENCOUNTER — Encounter: Payer: Self-pay | Admitting: Family Medicine

## 2018-09-12 ENCOUNTER — Other Ambulatory Visit: Payer: Self-pay | Admitting: Nurse Practitioner

## 2018-09-12 VITALS — BP 110/74 | Temp 97.5°F | Ht 68.0 in | Wt 254.0 lb

## 2018-09-12 DIAGNOSIS — R58 Hemorrhage, not elsewhere classified: Secondary | ICD-10-CM

## 2018-09-12 LAB — POCT URINALYSIS DIPSTICK
Spec Grav, UA: 1.02 (ref 1.010–1.025)
pH, UA: 6 (ref 5.0–8.0)

## 2018-09-12 LAB — HEMOCCULT GUIAC POC 1CARD (OFFICE): Fecal Occult Blood, POC: NEGATIVE

## 2018-09-12 LAB — POCT HEMOGLOBIN: Hemoglobin: 12.3 g/dL (ref 11–14.6)

## 2018-09-12 NOTE — Progress Notes (Signed)
Subjective:    Patient ID: [name], female    DOB: Dec 07, 1960, 58 y.o.   MRN: [id number]  Vaginal Bleeding  The patient's pertinent negatives include no vaginal discharge. This is a new problem. Episode onset: 4 weeks off and on. Associated symptoms include constipation. Pertinent negatives include no dysuria or frequency.  Vaginal bleeding off and on for the last several weeks, notices when has a BM and when urinating, has noticed some blood in her underwear. States she needs to get some pads. No vaginal discharge. Sexually active. Reports same partner for a long time. Denies painful intercourse no bleeding during or after. Reports pelvic pain with bleeding, noticed bleeding 3 days this last week.   Total hysterectomy   Reports some constipation. States stool is hard. LBM 2 days ago.    Right ear pain and drainage for 2 weeks.    Review of Systems  HENT: Positive for ear discharge and ear pain.   Gastrointestinal: Positive for blood in stool and constipation.  Genitourinary: Positive for vaginal bleeding. Negative for dysuria, frequency and vaginal discharge.       Objective:   Physical Exam Vitals signs and nursing note reviewed. Exam conducted with a chaperone present.  Constitutional:      General: She is not in acute distress.    Appearance: Normal appearance. She is not toxic-appearing.  HENT:     Head: Normocephalic and atraumatic.     Right Ear: Tympanic membrane, ear canal and external ear normal.     Left Ear: Tympanic membrane, ear canal and external ear normal.     Nose: Nose normal.     Mouth/Throat:     Mouth: Mucous membranes are moist.     Pharynx: Oropharynx is clear.  Eyes:     General:        Right eye: No discharge.        Left eye: No discharge.  Neck:     Musculoskeletal: Neck supple. No neck rigidity.  Cardiovascular:     Rate and Rhythm: Normal rate and regular rhythm.     Heart sounds: Normal heart sounds.  Pulmonary:     Effort:  Pulmonary effort is normal. No respiratory distress.     Breath sounds: Normal breath sounds.  Genitourinary:    General: Normal vulva.     Labia:        Right: No rash, tenderness, lesion or injury.        Left: No rash, tenderness, lesion or injury.      Vagina: Normal. No signs of injury. No vaginal discharge, tenderness, bleeding or lesions.     Rectum: Normal. Guaiac result negative. No mass, anal fissure or external hemorrhoid.     Comments: No tenderness or obvious masses on bimanual exam, exam limited d/t abdominal girth. No source of bleeding noted.  Lymphadenopathy:     Cervical: No cervical adenopathy.  Skin:    General: Skin is warm and dry.  Neurological:     Mental Status: She is alert. Mental status is at baseline.    Results for orders placed or performed in visit on 09/12/18  POCT hemoglobin  Result Value Ref Range   Hemoglobin 12.3 11 - 14.6 g/dL  POCT urinalysis dipstick  Result Value Ref Range   Color, UA     Clarity, UA     Glucose, UA     Bilirubin, UA     Ketones, UA     Spec Grav, UA 1.020 1.010 -  1.025   Blood, UA     pH, UA 6.0 5.0 - 8.0   Protein, UA     Urobilinogen, UA     Nitrite, UA     Leukocytes, UA     Appearance     Odor    POCT Occult Blood Stool  Result Value Ref Range   Fecal Occult Blood, POC Negative Negative   Card #1 Date     Card #2 Fecal Occult Blod, POC     Card #2 Date     Card #3 Fecal Occult Blood, POC     Card #3 Date     Microscopic exam of urine: no RBCs or WBCs noted. Occasional epithelial cell.     Assessment & Plan:  Bleeding of unknown origin - Plan: POCT hemoglobin  Pt is poor historian with hx of mental retardation. No obvious source of bleeding on exam today. Pt with hx of total hysterectomy, no lesions or lacerations noted on pelvic exam. Rectum normal without evidence of fissure or hemorrhoids, occult stool card negative. UA negative for RBCs. GI is the most likely source of bleeding, though no evidence  of this today. Will obtain CBC and ferritin. Sent home with three additional stool cards to obtain three different samples, will test to see if we find occult blood on any of those. Recommend referral to GI for further evaluation, pt had colonoscopy in 2018 with some polyps but no malignant findings, saw Dr. Gala Romney in the past. F/u if bleeding becomes severe.   Pt with c/o ear pain today, no findings of infection on exam today. Recommend she monitor this, if worse or no improvement over the next 1-2 weeks, or develops fever recommend f/u for this.

## 2018-09-13 LAB — CBC WITH DIFFERENTIAL/PLATELET
Basophils Absolute: 0.1 10*3/uL (ref 0.0–0.2)
Basos: 1 %
EOS (ABSOLUTE): 0.1 10*3/uL (ref 0.0–0.4)
Eos: 1 %
Hematocrit: 37.3 % (ref 34.0–46.6)
Hemoglobin: 12.9 g/dL (ref 11.1–15.9)
IMMATURE GRANS (ABS): 0 10*3/uL (ref 0.0–0.1)
Immature Granulocytes: 0 %
LYMPHS: 43 %
Lymphocytes Absolute: 2.1 10*3/uL (ref 0.7–3.1)
MCH: 31.5 pg (ref 26.6–33.0)
MCHC: 34.6 g/dL (ref 31.5–35.7)
MCV: 91 fL (ref 79–97)
Monocytes Absolute: 0.5 10*3/uL (ref 0.1–0.9)
Monocytes: 11 %
Neutrophils Absolute: 2.2 10*3/uL (ref 1.4–7.0)
Neutrophils: 44 %
Platelets: 191 10*3/uL (ref 150–450)
RBC: 4.09 x10E6/uL (ref 3.77–5.28)
RDW: 13 % (ref 11.7–15.4)
WBC: 4.9 10*3/uL (ref 3.4–10.8)

## 2018-09-13 LAB — FERRITIN: Ferritin: 68 ng/mL (ref 15–150)

## 2018-09-16 ENCOUNTER — Encounter: Payer: Self-pay | Admitting: Family Medicine

## 2018-09-16 ENCOUNTER — Encounter: Payer: Self-pay | Admitting: Internal Medicine

## 2018-09-24 ENCOUNTER — Ambulatory Visit (HOSPITAL_COMMUNITY): Payer: Medicare Other | Admitting: Psychiatry

## 2018-09-25 ENCOUNTER — Telehealth (HOSPITAL_COMMUNITY): Payer: Self-pay | Admitting: *Deleted

## 2018-09-25 ENCOUNTER — Other Ambulatory Visit (HOSPITAL_COMMUNITY): Payer: Self-pay | Admitting: Psychiatry

## 2018-09-25 MED ORDER — TRAZODONE HCL 100 MG PO TABS: ORAL_TABLET | ORAL | 2 refills | Status: DC

## 2018-09-25 MED ORDER — BENZTROPINE MESYLATE 1 MG PO TABS: 1.0000 mg | ORAL_TABLET | Freq: Every day | ORAL | 2 refills | Status: DC

## 2018-09-25 MED ORDER — PALIPERIDONE ER 9 MG PO TB24: 9.0000 mg | ORAL_TABLET | Freq: Every day | ORAL | 2 refills | Status: DC

## 2018-09-25 MED ORDER — LAMOTRIGINE 100 MG PO TABS: 100.0000 mg | ORAL_TABLET | Freq: Two times a day (BID) | ORAL | 2 refills | Status: DC

## 2018-09-25 MED ORDER — SERTRALINE HCL 100 MG PO TABS: 100.0000 mg | ORAL_TABLET | Freq: Every day | ORAL | 2 refills | Status: DC

## 2018-09-25 NOTE — Telephone Encounter (Signed)
Dr Harrington Challenger Patient's Social Worker called rescheduled appointment for the group home due to COVID-19 Refills requested Next Visit is 10-18-2018

## 2018-09-25 NOTE — Telephone Encounter (Signed)
sent 

## 2018-09-30 ENCOUNTER — Telehealth: Payer: Self-pay | Admitting: Family Medicine

## 2018-09-30 NOTE — Telephone Encounter (Signed)
She has an appointment later this week-please contact with this patient social service worker If things are stable with this patient I would recommend follow-up in a couple months when this settles down- certainly if needing any refills the pharmacy can notify us obviously if patient having significant health issues we can still see the patient

## 2018-10-01 NOTE — Telephone Encounter (Signed)
I called and left a voice msg for the social worker to call our office to possibly resch appt or refill medications.

## 2018-10-03 ENCOUNTER — Ambulatory Visit: Payer: Medicare Other | Admitting: Family Medicine

## 2018-10-10 ENCOUNTER — Other Ambulatory Visit: Payer: Self-pay

## 2018-10-10 ENCOUNTER — Other Ambulatory Visit: Payer: Self-pay | Admitting: Family Medicine

## 2018-10-10 ENCOUNTER — Ambulatory Visit: Payer: Self-pay | Admitting: Cardiothoracic Surgery

## 2018-10-17 ENCOUNTER — Other Ambulatory Visit: Payer: Self-pay | Admitting: Family Medicine

## 2018-10-18 ENCOUNTER — Ambulatory Visit (HOSPITAL_COMMUNITY): Payer: Medicare Other | Admitting: Psychiatry

## 2018-10-18 ENCOUNTER — Other Ambulatory Visit: Payer: Self-pay

## 2018-10-24 ENCOUNTER — Other Ambulatory Visit: Payer: Self-pay

## 2018-10-24 ENCOUNTER — Ambulatory Visit: Payer: Self-pay | Admitting: Cardiothoracic Surgery

## 2018-11-06 ENCOUNTER — Other Ambulatory Visit: Payer: Self-pay | Admitting: Family Medicine

## 2018-11-12 ENCOUNTER — Other Ambulatory Visit: Payer: Self-pay | Admitting: Nurse Practitioner

## 2018-11-12 ENCOUNTER — Other Ambulatory Visit: Payer: Self-pay | Admitting: Family Medicine

## 2018-11-15 NOTE — Telephone Encounter (Signed)
May have both with 2 refills

## 2018-11-19 ENCOUNTER — Other Ambulatory Visit: Payer: Self-pay

## 2018-11-19 ENCOUNTER — Encounter: Payer: Self-pay | Admitting: Internal Medicine

## 2018-11-19 ENCOUNTER — Ambulatory Visit (INDEPENDENT_AMBULATORY_CARE_PROVIDER_SITE_OTHER): Payer: Medicare Other | Admitting: Internal Medicine

## 2018-11-19 DIAGNOSIS — R1310 Dysphagia, unspecified: Secondary | ICD-10-CM

## 2018-11-19 DIAGNOSIS — K219 Gastro-esophageal reflux disease without esophagitis: Secondary | ICD-10-CM

## 2018-11-19 DIAGNOSIS — R1319 Other dysphagia: Secondary | ICD-10-CM

## 2018-11-19 DIAGNOSIS — K580 Irritable bowel syndrome with diarrhea: Secondary | ICD-10-CM | POA: Diagnosis not present

## 2018-11-19 MED ORDER — RABEPRAZOLE SODIUM 20 MG PO TBEC: 20.0000 mg | DELAYED_RELEASE_TABLET | Freq: Two times a day (BID) | ORAL | 3 refills | Status: DC

## 2018-11-19 NOTE — Progress Notes (Signed)
Referring Provider:  Primary Care Physician:  Kathyrn Drown, MD  Primary GI:   Virtual Visit via Telephone Note Due to COVID-19, visit is conducted virtually and was requested by patient.   I connected with [name] on 11/19/18 at  9:00 AM EDT by telephone and verified that I am speaking with the correct person using two identifiers.   I discussed the limitations, risks, security and privacy concerns of performing an evaluation and management service by telephone and the availability of in person appointments. I also discussed with the patient that there may be a patient responsible charge related to this service. The patient expressed understanding and agreed to proceed.  Chief Complaint  Patient presents with  . Gastroesophageal Reflux    F/U. C/o tasting acid in mouth  . Diarrhea    "all day x 2 weeks", blood in stool     History of Present Illness:    58 year old lady with GERD and intermittent diarrhea consistent with IBS.  Patient states she is stressed out because of social distancing in the coronavirus pandemic.  States she has reflux symptoms every day in spite of Protonix 40 mg twice daily.  Intermittent diarrhea states she passed some blood a few weeks ago.  [facility].  Ferritin, hemoglobin within normal limits.  Hemoccult negative stool.  No melena.  Colonoscopy with polyps removed 2 years ago. History of Schatzki's ring dilated previously.  History of a normal esophagus found 2018.   EGD empirically dilated with a 56 French Maloney dilator.  Patient states dysphagia symptoms persist intermittently.     Past Medical History:  Diagnosis Date  . Anxiety   . Diabetes mellitus   . Diabetes mellitus, type II ([facility])   . HTN (hypertension)   . Hyperglycemia   . IBS (irritable bowel syndrome)   . Lung nodules    right, followed by PCP, PET 11/2011  . Mental retardation   . MI (myocardial infarction) ([place])   . Migraines   . Sleep apnea       Past Surgical History:  Procedure Laterality Date  . ABDOMINAL HYSTERECTOMY    . COLONOSCOPY  11/2007   hyperplastic polyps, prior hx of adenomas   . COLONOSCOPY  05/2010   incomplete due to poor prep, hyperplastic rectal polyp  . COLONOSCOPY  05/05/2002   Dimunitive polyps in the rectum and left colon, cold    biopsied/removed.  Scattered few left-sided diverticula.  Regular colonic   mucosa appeared normal  . COLONOSCOPY N/A 05/28/2013   [facility] Cue: mulitple tubular adenomas removed. next tcs 05/2016  . COLONOSCOPY WITH PROPOFOL N/A 09/07/2016   Dr. Gala Romney: For hyperplastic polyps removed. Next colonoscopy March 2023 given history of adenomatous colon polyps in the past.  . ESOPHAGOGASTRODUODENOSCOPY  08/2007   moderate sized hiatal hernia  . ESOPHAGOGASTRODUODENOSCOPY  05/2010   noncritical schatzki ring s/p 26F  . ESOPHAGOGASTRODUODENOSCOPY (EGD) WITH ESOPHAGEAL DILATION N/A 02/06/2013   NLZ:JQBHAL esophagus-s/p dilation up to a 67 [place] size with [facility] dilators.  Hiatal hernia  . ESOPHAGOGASTRODUODENOSCOPY (EGD) WITH PROPOFOL N/A 09/07/2016   Dr. Gala Romney: Normal, status post empiric dilation of the esophagus for history of dysphagia  . EXTERNAL EAR SURGERY     bilateral  . FOOT SURGERY    . GLAUCOMA SURGERY    . MALONEY DILATION N/A 09/07/2016   Procedure: Venia Minks DILATION;  Surgeon: Daneil Dolin, MD;  [facility];  Service: Endoscopy;  Laterality: N/A;  . POLYPECTOMY  09/07/2016   Procedure: POLYPECTOMY;  Surgeon: Daneil Dolin, MD;  Location: [facility];  Service: Endoscopy;;  sigmoid colon x4  . small bowel capsule  10/2007   normal     Current Meds  Medication Sig  . atorvastatin (LIPITOR) 10 MG tablet TAKE 1 TABLET BY MOUTH ONCE A DAY.  . benztropine (COGENTIN) 1 MG tablet Take 1 tablet (1 mg total) by mouth daily.  . calcium-vitamin D (OSCAL WITH D) 500-200 MG-UNIT per tablet Take 1 tablet by mouth 3 (three) times daily.    . chlorzoxazone (PARAFON FORTE DSC) 500  MG tablet One qhs prn neck pain  . diclofenac (VOLTAREN) 75 MG EC tablet TAKE 1 TABLET BY MOUTH TWICE A DAY.  Marland Kitchen dicyclomine (BENTYL) 10 MG capsule TAKE 1 CAPSULE BY MOUTH FOUR TIMES DAILY BEFORE MEALS AND AT BEDTIME FOR LOOSE STOOLS.HOLD FOR CONSTIPATION  . lamoTRIgine (LAMICTAL) 100 MG tablet Take 1 tablet (100 mg total) by mouth 2 (two) times daily.  Marland Kitchen lisinopril (PRINIVIL,ZESTRIL) 2.5 MG tablet TAKE (1) TABLET BY MOUTH EACH MORNING.  . paliperidone (INVEGA) 9 MG 24 hr tablet Take 1 tablet (9 mg total) by mouth daily.  . pantoprazole (PROTONIX) 40 MG tablet TAKE 1 TABLET BY MOUTH TWICE DAILY, 30 MINUTES BEFORE MEALS.  Marland Kitchen potassium chloride (K-DUR) 10 MEQ tablet TAKE 1 TABLET BY MOUTH TWICE A DAY.  Marland Kitchen propranolol (INDERAL) 10 MG tablet TAKE 1 TABLET BY MOUTH THREE TIMES A DAY.  . sertraline (ZOLOFT) 100 MG tablet Take 1 tablet (100 mg total) by mouth daily.  Marland Kitchen torsemide (DEMADEX) 20 MG tablet TAKE (1) TABLET BY MOUTH EACH MORNING.  . traZODone (DESYREL) 100 MG tablet TAKE 2 TABLETS(200MG ) BY MOUTH AT BEDTIME.     Family History  Problem Relation Age of Onset  . Stroke Mother   . Heart attack Father   . Schizophrenia Other   . Drug abuse Other   . Alcohol abuse Other   . Colon cancer Other        aunt  . Obesity Other   . COPD Other   . GER disease Other   . Diabetes type II Other   . Anxiety disorder Other   . Depression Other   . Depression Sister   . Schizophrenia Sister   . Liver disease Neg Hx   . Inflammatory bowel disease Neg Hx     Social History   Socioeconomic History  . Marital status: Divorced    Spouse name: Not on file  . Number of children: Not on file  . Years of education: Not on file  . Highest education level: Not on file  Occupational History  . Occupation: disabled    Fish farm manager: UNEMPLOYED  Social Needs  . Financial resource strain: Not on file  . Food insecurity:    Worry: Not on file    Inability: Not on file  . Transportation needs:    Medical:  Not on file    Non-medical: Not on file  Tobacco Use  . Smoking status: Current Every Day Smoker    Packs/day: 0.25    Years: 40.00    Pack years: 10.00    Types: Cigarettes  . Smokeless tobacco: Never Used  Substance and Sexual Activity  . Alcohol use: No  . Drug use: No  . Sexual activity: Yes    Partners: Male    Birth control/protection: None    Comment: boyfriend  Lifestyle  . Physical activity:    Days per week:  Not on file    Minutes per session: Not on file  . Stress: Not on file  Relationships  . Social connections:    Talks on phone: Not on file    Gets together: Not on file    Attends religious service: Not on file    Active member of club or organization: Not on file    Attends meetings of clubs or organizations: Not on file    Relationship status: Not on file  Other Topics Concern  . Not on file  Social History Narrative  . Not on file       Review of Systems:  As in HPI.    Observations/Objective: No distress. Unable to perform physical exam due to telephone encounter. No video available.   Assessment and Plan:   Pleasant 58 year old lady with intermittent diarrhea consistent with IBS D.  History of GERD apparently refractory now to twice daily Protonix.  Intermittent esophageal dysphagia.  Patent tubular esophagus previously did respond to empiric dilation.  Sketchy report of rectal bleeding.  Hemoccult negative and normal hematological parameters recently through PCP.  Colonoscopy not that long ago without significant findings.   Follow Up Instructions:  Lets call in a prescription for AcipHex 20 mg twice daily.  Patient is to stop Protonix  Dispense 60 with 3 refills.  GERD information provided to the patient  Irritable bowel syndrome information sent to the patient  Begin Imodium 2 mg twice daily as needed for diarrhea  Office visit in 3 months here to discuss symptoms and discussed the possibility of repeat EGD with esophageal dilation.   Call with any interim problems.     I discussed the assessment and treatment plan with the patient. The patient was provided an opportunity to ask questions and all were answered. The patient agreed with the plan and demonstrated an understanding of the instructions.   The patient was advised to call back or seek an in-person evaluation if the symptoms worsen or if the condition fails to improve as anticipated.  I provided  7.5 minutes of non-face-to-face time during this encounter.  Bridgette Habermann, MD [facility]

## 2018-11-19 NOTE — Patient Instructions (Signed)
   Lets call in a prescription for AcipHex 20 mg twice daily.  Patient is to stop Protonix  Dispense 60 with 3 refills.  GERD information provided to the patient  Irritable bowel syndrome information sent to the patient  Begin Imodium 2 mg twice daily as needed for diarrhea  Office visit in 3 months here to discuss symptoms and discussed the possibility of repeat EGD with esophageal dilation.  Call with any interim problems.

## 2018-11-21 ENCOUNTER — Encounter: Payer: Medicare Other | Attending: Family Medicine | Admitting: Nutrition

## 2018-11-21 ENCOUNTER — Other Ambulatory Visit: Payer: Self-pay

## 2018-11-21 DIAGNOSIS — I10 Essential (primary) hypertension: Secondary | ICD-10-CM

## 2018-11-21 DIAGNOSIS — E782 Mixed hyperlipidemia: Secondary | ICD-10-CM

## 2018-11-21 DIAGNOSIS — E119 Type 2 diabetes mellitus without complications: Secondary | ICD-10-CM

## 2018-11-21 DIAGNOSIS — R739 Hyperglycemia, unspecified: Secondary | ICD-10-CM | POA: Insufficient documentation

## 2018-11-21 NOTE — Patient Instructions (Addendum)
Goals Eat breakfast daily Don't skip meals Keep walking Increase fresh fruits and vegetables

## 2018-11-21 NOTE — Progress Notes (Signed)
Telephone visit, follow up DM Medical Nutrition Therapy:  Appt start time: 1330 end time:  1400     Assessment:  Primary concerns today: Diabetes Type 2. F/u   She notes she is trying to eat as suggested with more vegetables and fruit. Drinks water. Trying to avoid snacks. Still skips meals if she doesn't feel like eating.which is her biggest issue right now. Diet controlled DM Typd 2 A1C was 6.6%  7/19.Marland Kitchen PMH Hyperlipidemia and HTN.  Walks some in her house. No recent A1C Last 11/19 was 6.1%. Lost 5 lbs.   Lab Results  Component Value Date   HGBA1C 6.1 (A) 05/28/2018   CMP Latest Ref Rng & Units 02/28/2018 01/17/2018 06/08/2017  Glucose 65 - 99 mg/dL 89 107(H) -  BUN 6 - 24 mg/dL 11 12 -  Creatinine 0.57 - 1.00 mg/dL 0.98 1.02(H) -  Sodium 134 - 144 mmol/L 140 142 -  Potassium 3.5 - 5.2 mmol/L 4.4 4.3 -  Chloride 96 - 106 mmol/L 96 97 -  CO2 20 - 29 mmol/L 27 29 -  Calcium 8.7 - 10.2 mg/dL 9.7 9.0 -  Total Protein 6.0 - 8.5 g/dL - - 7.4  Total Bilirubin 0.0 - 1.2 mg/dL - - 0.2  Alkaline Phos 39 - 117 IU/L - - 111  AST 0 - 40 IU/L - - 32  ALT 0 - 32 IU/L - - 19   Lipid Panel     Component Value Date/Time   CHOL 123 01/17/2018 0948   TRIG 100 01/17/2018 0948   HDL 52 01/17/2018 0948   CHOLHDL 2.4 01/17/2018 0948   CHOLHDL 2.9 07/20/2014 1214   VLDL 21 07/20/2014 1214   LDLCALC 51 01/17/2018 0948    Vitals with BMI 09/12/2018 08/19/2018 07/01/2018  Height 5\' 8"  5\' 8"    Weight 254 lbs 254 lbs 259 lbs 6 oz  BMI 38.63 41.66 06.30  Systolic 160  109  Diastolic 74  90  Pulse     Respirations       Preferred Learning Style:  Visual, verbal, hands on.  Learning Readiness:  Ready  Change in progress   MEDICATIONS:   DIETARY INTAKE:  24-hr recall:  B ( AM): skipped Snk ( AM): L ( PM): eggs and bacon, toast, water  D ( PM): skipped;   baked chicken, string beans, water Snk ( PM):  Beverages: water   Usual physical activity walking some   Estimated energy  needs: 1200  calories 135 g carbohydrates 90 g protein 33 g fat  Progress Towards Goal(s):  In progress.   Nutritional Diagnosis:  NB-1.1 Food and nutrition-related knowledge deficit As related to Diabetes.  As evidenced by  A1C 6.6%..    Intervention:  Nutrition Nutrition and Diabetes education provided on My Plate, CHO counting, meal planning, portion sizes, timing of meals, avoiding snacks between meals unless having a low blood sugar, target ranges for A1C and blood sugars, signs/symptoms and treatment of hyper/hypoglycemia, monitoring blood sugars, taking medications as prescribed, benefits of exercising 30 minutes per day and prevention of complications of DM. HIgh Fiber Foods.  Goals Eat breakfast daily Don't skip meals Keep walking Increase fresh fruits and vegetables   Teaching Method Utilized:  Visual Auditory Hands on  Handouts given during visit include:  The Plate Method   Meal Plan Card  Diabetes Instrucitons.  Barriers to learning/adherence to lifestyle change: CVA, HA, Limited mobility.  Demonstrated degree of understanding via:  Teach Back   Monitoring/Evaluation:  Dietary  intake, exercise, , and body weight in 3 month(s).Marland Kitchen

## 2018-11-26 ENCOUNTER — Other Ambulatory Visit: Payer: Self-pay

## 2018-11-26 ENCOUNTER — Ambulatory Visit (INDEPENDENT_AMBULATORY_CARE_PROVIDER_SITE_OTHER): Payer: Medicare Other | Admitting: Family Medicine

## 2018-11-26 DIAGNOSIS — M170 Bilateral primary osteoarthritis of knee: Secondary | ICD-10-CM | POA: Diagnosis not present

## 2018-11-26 NOTE — Progress Notes (Signed)
   Subjective:    Patient ID: [name], female    DOB: 31-Aug-1960, 58 y.o.   MRN: [id number] Telephone only patient did not have video Arthritis  Presents for follow-up visit. She complains of pain, stiffness and joint swelling. Affected location: pt states she is having problems with arms and legs. pt states sometimes it takes her about 45 min to one hour to get up from sitting position    Pt has tried medication for arthritis but has not been working.  Her main problem are her knees she feels like they are stiff a lot painful when she walks she is a large lady and does have some evidence of osteoarthritis she would benefit from seeing orthopedics  She states she is taking her other medication trying to eat healthy try to stay active and stay away from coronavirus Virtual Visit via Video Note  I connected with [name] on 11/26/18 at 11:00 AM EDT by a video enabled telemedicine application and verified that I am speaking with the correct person using two identifiers.  Location: Patient: home Provider: office   I discussed the limitations of evaluation and management by telemedicine and the availability of in person appointments. The patient expressed understanding and agreed to proceed.  History of Present Illness:    Observations/Objective:   Assessment and Plan:   Follow Up Instructions:    I discussed the assessment and treatment plan with the patient. The patient was provided an opportunity to ask questions and all were answered. The patient agreed with the plan and demonstrated an understanding of the instructions.   The patient was advised to call back or seek an in-person evaluation if the symptoms worsen or if the condition fails to improve as anticipated.  I provided 16 minutes of non-face-to-face time during this encounter.   Vicente Males, LPN    Review of Systems  Constitutional: Negative for activity change and appetite change.  HENT: Negative  for congestion and rhinorrhea.   Respiratory: Negative for cough and shortness of breath.   Cardiovascular: Negative for chest pain and leg swelling.  Gastrointestinal: Negative for abdominal pain, nausea and vomiting.  Musculoskeletal: Positive for arthralgias, arthritis, joint swelling and stiffness.  Skin: Negative for color change.  Neurological: Negative for dizziness and weakness.  Psychiatric/Behavioral: Negative for agitation and confusion.       Objective:   Physical Exam  Unable to do physical exam because she did not have video capability      Assessment & Plan:  Bilateral osteoarthritis I recommend referral to orthopedics they will probably benefit doing some injections for her  Continue her other medicines as is and follow-up later in June for a in person visit  15 minutes was spent with patient today discussing healthcare issues which they came.  More than 50% of this visit-total duration of visit-was spent in counseling and coordination of care.  Please see diagnosis regarding the focus of this coordination and care

## 2018-12-03 ENCOUNTER — Encounter: Payer: Self-pay | Admitting: Nutrition

## 2018-12-10 ENCOUNTER — Other Ambulatory Visit (HOSPITAL_COMMUNITY): Payer: Self-pay | Admitting: Psychiatry

## 2018-12-10 ENCOUNTER — Other Ambulatory Visit: Payer: Self-pay | Admitting: Family Medicine

## 2018-12-16 ENCOUNTER — Ambulatory Visit (INDEPENDENT_AMBULATORY_CARE_PROVIDER_SITE_OTHER): Payer: Medicare Other

## 2018-12-16 ENCOUNTER — Other Ambulatory Visit: Payer: Self-pay

## 2018-12-16 ENCOUNTER — Encounter: Payer: Self-pay | Admitting: Orthopedic Surgery

## 2018-12-16 ENCOUNTER — Ambulatory Visit (INDEPENDENT_AMBULATORY_CARE_PROVIDER_SITE_OTHER): Payer: Medicare Other | Admitting: Orthopedic Surgery

## 2018-12-16 VITALS — BP 104/66 | HR 81 | Temp 97.7°F | Ht 68.5 in | Wt 253.0 lb

## 2018-12-16 DIAGNOSIS — M25562 Pain in left knee: Secondary | ICD-10-CM

## 2018-12-16 DIAGNOSIS — M25561 Pain in right knee: Secondary | ICD-10-CM

## 2018-12-16 DIAGNOSIS — M5441 Lumbago with sciatica, right side: Secondary | ICD-10-CM

## 2018-12-16 DIAGNOSIS — M5442 Lumbago with sciatica, left side: Secondary | ICD-10-CM

## 2018-12-16 DIAGNOSIS — M170 Bilateral primary osteoarthritis of knee: Secondary | ICD-10-CM | POA: Diagnosis not present

## 2018-12-16 NOTE — Patient Instructions (Signed)

## 2018-12-16 NOTE — Progress Notes (Signed)
NEW PROBLEM OFFICE VISIT  Chief Complaint  Patient presents with  . Knee Pain    Bilat knee and legs for a couple years. NKI    58 year old female with multiple medical problems and longstanding problems with her knees and back presents for evaluation as a new patient.  I will address her problems separately  Problem #1 bilateral knee pain status post arthroscopy left knee.  She complains of severe lateral bilateral knee pain with dull aching pain primarily over the lateral compartment of each knee which is been longstanding for greater than a year.  She did have surgery on the left knee initially did well.  She has pain crepitance decreased range of motion and difficulty ambulation having had therapy several times to try to improve her balance.  She currently uses a cane  Problem #2 back pain the patient says she has had multiple rounds of physical therapy related to her back.  She continues to have back pain and bilateral leg pain which radiates from the back and hips down into both knee and calf areas.  Prior treatments include bilateral knee injections and surgery on the left knee   Review of Systems  Constitutional: Positive for chills and malaise/fatigue. Negative for fever.  HENT: Positive for congestion and tinnitus.   Eyes: Positive for blurred vision and photophobia.  Respiratory: Positive for cough, sputum production and shortness of breath.   Cardiovascular: Positive for chest pain, claudication and leg swelling.  Gastrointestinal: Positive for constipation, diarrhea and heartburn.  Genitourinary: Positive for dysuria, flank pain and frequency.  Musculoskeletal: Positive for back pain, falls, joint pain, myalgias and neck pain.  Skin: Positive for itching.  Neurological: Positive for dizziness, tingling, tremors, weakness and headaches.  Endo/Heme/Allergies: Positive for polydipsia.  Psychiatric/Behavioral: Positive for depression. The patient is nervous/anxious.   All  other systems reviewed and are negative.    Past Medical History:  Diagnosis Date  . Anxiety   . Diabetes mellitus   . Diabetes mellitus, type II ([place])   . HTN (hypertension)   . Hyperglycemia   . IBS (irritable bowel syndrome)   . Lung nodules    right, followed by PCP, PET 11/2011  . Mental retardation   . MI (myocardial infarction) ([place])   . Migraines   . Sleep apnea     Past Surgical History:  Procedure Laterality Date  . ABDOMINAL HYSTERECTOMY    . COLONOSCOPY  11/2007   hyperplastic polyps, prior hx of adenomas   . COLONOSCOPY  05/2010   incomplete due to poor prep, hyperplastic rectal polyp  . COLONOSCOPY  05/05/2002   Dimunitive polyps in the rectum and left colon, cold    biopsied/removed.  Scattered few left-sided diverticula.  Regular colonic   mucosa appeared normal  . COLONOSCOPY N/A 05/28/2013   Rourk: mulitple tubular adenomas removed. next tcs 05/2016  . COLONOSCOPY WITH PROPOFOL N/A 09/07/2016   Dr. Gala Romney: For hyperplastic polyps removed. Next colonoscopy March 2023 given history of adenomatous colon polyps in the past.  . ESOPHAGOGASTRODUODENOSCOPY  08/2007   moderate sized hiatal hernia  . ESOPHAGOGASTRODUODENOSCOPY  05/2010   noncritical schatzki ring s/p 63F  . ESOPHAGOGASTRODUODENOSCOPY (EGD) WITH ESOPHAGEAL DILATION N/A 02/06/2013   UVO:[id number] esophagus-s/p dilation up to a 64 [place] size with [facility] dilators.  Hiatal hernia  . ESOPHAGOGASTRODUODENOSCOPY (EGD) WITH PROPOFOL N/A 09/07/2016   Dr. Gala Romney: Normal, status post empiric dilation of the esophagus for history of dysphagia  . EXTERNAL EAR SURGERY     bilateral  .  FOOT SURGERY    . GLAUCOMA SURGERY    . MALONEY DILATION N/A 09/07/2016   Procedure: Venia Minks DILATION;  Surgeon: Daneil Dolin, MD;  [facility];  Service: Endoscopy;  Laterality: N/A;  . POLYPECTOMY  09/07/2016   Procedure: POLYPECTOMY;  Surgeon: Daneil Dolin, MD;  Location: [facility];  Service: Endoscopy;;  sigmoid colon  x4  . small bowel capsule  10/2007   normal    Family History  Problem Relation Age of Onset  . Stroke Mother   . Heart attack Father   . Schizophrenia Other   . Drug abuse Other   . Alcohol abuse Other   . Colon cancer Other        aunt  . Obesity Other   . COPD Other   . GER disease Other   . Diabetes type II Other   . Anxiety disorder Other   . Depression Other   . Depression Sister   . Schizophrenia Sister   . Liver disease Neg Hx   . Inflammatory bowel disease Neg Hx    Social History   Tobacco Use  . Smoking status: Current Every Day Smoker    Packs/day: 0.25    Years: 40.00    Pack years: 10.00    Types: Cigarettes  . Smokeless tobacco: Never Used  Substance Use Topics  . Alcohol use: No  . Drug use: No    Allergies  Allergen Reactions  . Thorazine [Chlorpromazine Hcl] Anaphylaxis  . Acetaminophen Other (See Comments)    Makes pt dizzy  . Aspirin Other (See Comments)    seizure  . Aspirin-Acetaminophen-Caffeine Other (See Comments)    seizure  . Nsaids Nausea And Vomiting  . Penicillins Nausea And Vomiting    Has patient had a PCN reaction causing immediate rash, facial/tongue/throat swelling, SOB or lightheadedness with hypotension:Yes Has patient had a PCN reaction causing severe rash involving mucus membranes or skin necrosis:Yes Has patient had a PCN reaction that required hospitalization:Yes Has patient had a PCN reaction occurring within the last 10 years:No If all of the above answers are "NO", then may proceed with Cephalosporin use.   . Tomato Rash    Current Meds  Medication Sig  . atorvastatin (LIPITOR) 10 MG tablet TAKE 1 TABLET BY MOUTH ONCE A DAY.  . benztropine (COGENTIN) 1 MG tablet Take 1 tablet (1 mg total) by mouth daily.  . calcium-vitamin D (OSCAL WITH D) 500-200 MG-UNIT per tablet Take 1 tablet by mouth 3 (three) times daily.    . chlorzoxazone (PARAFON FORTE DSC) 500 MG tablet One qhs prn neck pain  . diclofenac (VOLTAREN)  75 MG EC tablet TAKE 1 TABLET BY MOUTH TWICE A DAY.  Marland Kitchen dicyclomine (BENTYL) 10 MG capsule TAKE 1 CAPSULE BY MOUTH FOUR TIMES DAILY BEFORE MEALS AND AT BEDTIME FOR LOOSE STOOLS.HOLD FOR CONSTIPATION  . lamoTRIgine (LAMICTAL) 100 MG tablet Take 1 tablet (100 mg total) by mouth 2 (two) times daily.  Marland Kitchen lisinopril (ZESTRIL) 2.5 MG tablet TAKE (1) TABLET BY MOUTH EACH MORNING.  . paliperidone (INVEGA) 9 MG 24 hr tablet Take 1 tablet (9 mg total) by mouth daily.  . pantoprazole (PROTONIX) 40 MG tablet TAKE 1 TABLET BY MOUTH TWICE DAILY, 30 MINUTES BEFORE MEALS.  Marland Kitchen potassium chloride (K-DUR) 10 MEQ tablet TAKE 1 TABLET BY MOUTH TWICE A DAY.  Marland Kitchen propranolol (INDERAL) 10 MG tablet TAKE 1 TABLET BY MOUTH THREE TIMES A DAY.  . RABEprazole (ACIPHEX) 20 MG tablet  Take 1 tablet (20 mg total) by mouth 2 (two) times a day.  . sertraline (ZOLOFT) 100 MG tablet Take 1 tablet (100 mg total) by mouth daily.  Marland Kitchen torsemide (DEMADEX) 20 MG tablet TAKE (1) TABLET BY MOUTH EACH MORNING.  . traZODone (DESYREL) 100 MG tablet TAKE 2 TABLETS(200MG ) BY MOUTH AT BEDTIME.    BP 104/66   Pulse 81   Temp 97.7 F (36.5 C)   Ht 5' 8.5" (1.74 m)   Wt 253 lb (114.8 kg)   BMI 37.91 kg/m   Physical Exam General appearance development seems normal nutrition is adequate body habitus mesomorphic no gross deformities except valgus deformity of the knees and she is adequately groomed  Peripheral vascular system there was no edema pulses were intact distally in both legs the temperature was normal and there was no tenderness I did not see any swelling or varicose veins related to the vascular tree  Lymphatic system lymph nodes lower extremities normal   Ortho Exam  Gait was supported by a cane and she had valgus knees with valgus thrust bilaterally right worse than left  Tenderness was noted in the lateral compartment of the right and left knee and there was crepitation on range of motion without effusion.  Her range of motion in  the right knee was 125 degrees left knee 120.  She appeared to have small flexion contractures in each knee but both knees had normal stability.  Muscle tone and strength were normal without tremor  Upper extremities right and left no tenderness instability range of motion deficits contractures tremors or weakness  All 4 extremities showed normal skin without any lesions there was scars over the left knee from surgery arthroscopy and scar on the right knee from falls.  Upper extremities skin was normal  She had negative nerve stretch tests but normal reflexes bilaterally and upper extremity reflexes were normal.  Sensory exam by soft touch and position were normal  She was oriented to time person and place mood and affect were normal  She had normal coordination in the upper and lower extremities  MEDICAL DECISION SECTION  Xrays were done at [facility] see report  Moderate to severe arthritis both knees right worse than left with valgus alignment  She had a lumbar spine x-ray done a year or so ago which showed Facet arthritis at L3 and 4 mild disc space narrowing L3-S1  The report was read as follows:   CLINICAL DATA:  58 year old female with low back pain radiating into both legs. No known injury. Initial encounter.   EXAM: LUMBAR SPINE - COMPLETE 4+ VIEW   COMPARISON:  01/27/2014 CT.   FINDINGS: Minimal curvature lumbar spine.   Minimal L3-4 facet degenerative changes.  No pars defect.   Minimal disc space narrowing L3-4 through L5-S1.   Vascular calcifications.   IMPRESSION: Minimal disc space narrowing L3-4 through L5-S1.   Minimal L3-4 facet degenerative changes.   Trace vascular calcification.     Electronically Signed   By: Genia Del M.D.   On: 07/17/2017 19:30   Encounter Diagnoses  Name Primary?  . Pain in both knees, unspecified chronicity Yes  . Primary osteoarthritis of both knees   . Acute bilateral low back pain with bilateral  sciatica     PLAN: (Rx., injectx, surgery, frx, mri/ct) This patient will need her knees replaced at some point.  I am concerned that she has radicular pain which is not been imaged with MRI.  She  also has a lot of positive findings on review of systems.  This of course will have to be ironed out prior to any surgery.  I did inject both knees to allow some pain control while we are waiting for her MRI.  Procedure note for bilateral knee injections  Procedure note left knee injection verbal consent was obtained to inject left knee joint  Timeout was completed to confirm the site of injection  The medications used were 40 mg of Depo-Medrol and 1% lidocaine 3 cc  Anesthesia was provided by ethyl chloride and the skin was prepped with alcohol.  After cleaning the skin with alcohol a 20-gauge needle was used to inject the left knee joint. There were no complications. A sterile bandage was applied.   Procedure note right knee injection verbal consent was obtained to inject right knee joint  Timeout was completed to confirm the site of injection  The medications used were 40 mg of Depo-Medrol and 1% lidocaine 3 cc  Anesthesia was provided by ethyl chloride and the skin was prepped with alcohol.  After cleaning the skin with alcohol a 20-gauge needle was used to inject the right knee joint. There were no complications. A sterile bandage was applied.   No orders of the defined types were placed in this encounter.   Arther Abbott, MD  12/16/2018 12:06 PM

## 2018-12-27 ENCOUNTER — Other Ambulatory Visit (HOSPITAL_COMMUNITY): Payer: Self-pay | Admitting: Family Medicine

## 2018-12-27 DIAGNOSIS — Z1231 Encounter for screening mammogram for malignant neoplasm of breast: Secondary | ICD-10-CM

## 2018-12-31 ENCOUNTER — Telehealth: Payer: Self-pay | Admitting: Radiology

## 2018-12-31 DIAGNOSIS — M5441 Lumbago with sciatica, right side: Secondary | ICD-10-CM

## 2018-12-31 DIAGNOSIS — M5442 Lumbago with sciatica, left side: Secondary | ICD-10-CM

## 2018-12-31 NOTE — Telephone Encounter (Signed)
Patient has not been  Scheduled for lumbar MRI you ordered on June 8th. [facility] spoke with [name] they [place] of [place]. Patient can not have scan at [facility] due to claustrophobia  She can not have MRI at this time, to you [facility]

## 2019-01-01 ENCOUNTER — Telehealth: Payer: Self-pay | Admitting: Radiology

## 2019-01-01 NOTE — Addendum Note (Signed)
Addended byCandice Camp on: 01/01/2019 10:41 AM   Modules accepted: Orders

## 2019-01-01 NOTE — Telephone Encounter (Signed)
Called to Schedule July 8th 12 arrive at 11:30   Left message for [name] to advise.

## 2019-01-01 NOTE — Telephone Encounter (Signed)
Order CT at [facility]

## 2019-01-06 ENCOUNTER — Other Ambulatory Visit: Payer: Self-pay

## 2019-01-06 ENCOUNTER — Ambulatory Visit (INDEPENDENT_AMBULATORY_CARE_PROVIDER_SITE_OTHER): Payer: Medicare Other | Admitting: Family Medicine

## 2019-01-06 DIAGNOSIS — E119 Type 2 diabetes mellitus without complications: Secondary | ICD-10-CM

## 2019-01-06 DIAGNOSIS — I10 Essential (primary) hypertension: Secondary | ICD-10-CM

## 2019-01-06 DIAGNOSIS — E785 Hyperlipidemia, unspecified: Secondary | ICD-10-CM | POA: Diagnosis not present

## 2019-01-06 MED ORDER — POTASSIUM CHLORIDE ER 10 MEQ PO TBCR: 10.0000 meq | EXTENDED_RELEASE_TABLET | Freq: Two times a day (BID) | ORAL | 5 refills | Status: DC

## 2019-01-06 MED ORDER — LISINOPRIL 2.5 MG PO TABS: ORAL_TABLET | ORAL | 5 refills | Status: DC

## 2019-01-06 MED ORDER — TORSEMIDE 20 MG PO TABS: ORAL_TABLET | ORAL | 5 refills | Status: DC

## 2019-01-06 MED ORDER — ATORVASTATIN CALCIUM 10 MG PO TABS: 10.0000 mg | ORAL_TABLET | Freq: Every day | ORAL | 5 refills | Status: DC

## 2019-01-06 MED ORDER — DICLOFENAC SODIUM 75 MG PO TBEC: 75.0000 mg | DELAYED_RELEASE_TABLET | Freq: Two times a day (BID) | ORAL | 4 refills | Status: DC

## 2019-01-06 MED ORDER — PROPRANOLOL HCL 10 MG PO TABS: 10.0000 mg | ORAL_TABLET | Freq: Three times a day (TID) | ORAL | 5 refills | Status: DC

## 2019-01-06 NOTE — Progress Notes (Signed)
Subjective:    Patient ID: [name], female    DOB: September 14, 1960, 58 y.o.   MRN: [id number] Several attempts at video which failed Phone visit Diabetes She presents for her follow-up diabetic visit. She has type 2 diabetes mellitus. Pertinent negatives for hypoglycemia include no confusion or dizziness. Pertinent negatives for diabetes include no chest pain, no fatigue, no polydipsia, no polyphagia and no weakness. Risk factors for coronary artery disease include diabetes mellitus, hypertension, obesity, post-menopausal and dyslipidemia. Current diabetic treatment includes diet. She is compliant with treatment all of the time. Her weight is stable. She is following a diabetic diet.   Patient doing the best she can at watching her diet.  She relates occasional pains in her knees and hips.  Denies any significant abdominal pain.  Denies any bleeding issues.  Her social workers stated that the patient stated something about menstrual cramps but the patient has had a hysterectomy.  So the patient states today she is not having any abdominal pain.  Social worker states that the patient states she has menstrual cramps when on toilet?   Virtual Visit via Video Note  I connected with [name] on 01/06/19 at 10:00 AM EDT by a video enabled telemedicine application and verified that I am speaking with the correct person using two identifiers.  Location: Patient: home Provider: office   I discussed the limitations of evaluation and management by telemedicine and the availability of in person appointments. The patient expressed understanding and agreed to proceed.  History of Present Illness:    Observations/Objective:   Assessment and Plan:   Follow Up Instructions:    I discussed the assessment and treatment plan with the patient. The patient was provided an opportunity to ask questions and all were answered. The patient agreed with the plan and demonstrated an understanding of  the instructions.   The patient was advised to call back or seek an in-person evaluation if the symptoms worsen or if the condition fails to improve as anticipated.  I provided 25 minutes of non-face-to-face time during this encounter.     Review of Systems  Constitutional: Negative for activity change, appetite change and fatigue.  HENT: Negative for congestion and rhinorrhea.   Respiratory: Negative for cough and shortness of breath.   Cardiovascular: Negative for chest pain and leg swelling.  Gastrointestinal: Negative for abdominal pain and diarrhea.  Endocrine: Negative for polydipsia and polyphagia.  Skin: Negative for color change.  Neurological: Negative for dizziness and weakness.  Psychiatric/Behavioral: Negative for behavioral problems and confusion.       Objective:   Physical Exam    Today's visit was via telephone Physical exam was not possible for this visit      Assessment & Plan:  Very nice patient but she has a tendency to over play her symptoms and often goes into multiple related health issues that never necessarily pan out 100%.  His best I can tell blood pressure under good control continue current measures  Moderate obesity patient was encouraged to watch diet Stay active  Patient also with diabetes which appears to be stable as best we can tell  Hyperlipidemia taking her medicine watching diet best we can tell stable do lab work previous labs reviewed  But based upon my discussion with her as best I can tell she is doing okay taking her medicines trying to watch her diet and stay active I would not recommend any major interventions currently  His best I can  tell her diabetes is stable we will send her papers to do her lab work.  Hopefully should do those somewhere in the course of the next several weeks  Follow-up virtually in 3 to 4  months

## 2019-01-07 NOTE — Progress Notes (Signed)
Lab orders placed and mailed to patient  

## 2019-01-07 NOTE — Addendum Note (Signed)
Addended by: Vicente Males on: 01/07/2019 09:25 AM   Modules accepted: Orders

## 2019-01-08 ENCOUNTER — Ambulatory Visit: Payer: Self-pay

## 2019-01-13 ENCOUNTER — Other Ambulatory Visit: Payer: Self-pay

## 2019-01-13 ENCOUNTER — Ambulatory Visit (HOSPITAL_COMMUNITY)
Admission: RE | Admit: 2019-01-13 | Discharge: 2019-01-13 | Disposition: A | Discharge status: Home / Self Care | Payer: Medicare Other | Source: Ambulatory Visit | Attending: Family Medicine | Admitting: Family Medicine

## 2019-01-13 DIAGNOSIS — Z1231 Encounter for screening mammogram for malignant neoplasm of breast: Secondary | ICD-10-CM | POA: Diagnosis not present

## 2019-01-14 ENCOUNTER — Other Ambulatory Visit: Payer: Self-pay | Admitting: Internal Medicine

## 2019-01-14 ENCOUNTER — Other Ambulatory Visit: Payer: Self-pay | Admitting: Gastroenterology

## 2019-01-15 ENCOUNTER — Ambulatory Visit (HOSPITAL_COMMUNITY)
Admission: RE | Admit: 2019-01-15 | Discharge: 2019-01-15 | Disposition: A | Discharge status: Home / Self Care | Payer: Medicare Other | Source: Ambulatory Visit | Attending: Orthopedic Surgery | Admitting: Orthopedic Surgery

## 2019-01-15 ENCOUNTER — Other Ambulatory Visit: Payer: Self-pay

## 2019-01-15 DIAGNOSIS — M5442 Lumbago with sciatica, left side: Secondary | ICD-10-CM | POA: Diagnosis not present

## 2019-01-15 DIAGNOSIS — M4807 Spinal stenosis, lumbosacral region: Secondary | ICD-10-CM | POA: Diagnosis not present

## 2019-01-15 DIAGNOSIS — M5441 Lumbago with sciatica, right side: Secondary | ICD-10-CM | POA: Insufficient documentation

## 2019-01-15 NOTE — Patient Outreach (Addendum)
[facility]) Care Management  01/15/2019  MARYSA WESSNER 1960/11/30 [id number]   Received referral from [facility] on 12-31-2018 requesting to follow up with member due to DM and HTN.  PMH:DM type 2; HTN; HLD; GERD; Diarrhea; OSA on CPAP; IBS; Anxiety; Mild Mental Retardation; Glaucoma; Hypokalemia; knee and back pain and migraine HA; Smoker  Called member at [phone] and number to [facility]; called [phone] and spoke to Foye Spurling, [employer] who stated she assists member with appointments. Called [phone] and member answered phone. Introduced self and explained reason for the call. HIPPA identifiers verified.   Explained that [facility] care coordination services, connection to local community resources and personal assistance in managing member's healthcare needs are provided to member without cost. Additional benefits explained as well.   Member verbalized understanding and agreement and was able to answer some assessment questions; however, member stated she is in process of going to her morning appointment. Member states her address is [address] apartment [place], [place] [address].  Member stated she uses CPAP due to sleep apnea; has DM type 2; smokes 8-10 cigarettes a day; takes BP medication; Zoloft for depression and trazadone to assist with sleeping.  Will follow up with member within 1-3 weeks to complete initial assessment. Will send Successful Outreach Letter. Will send Physicians barrier letter to PCP.  THN CM Care Plan Problem One     Most Recent Value  Care Plan Problem One  knowledge deficit related to chronic disease: DM management and symptoms  Role Documenting the Problem One  Care Management Coordinator  Care Plan for Problem One  Active  THN Long Term Goal   Member will not experience hypoglycemia episode for next 60 days as manifested by  member's verbal report  THN Long Term Goal Start Date  01/15/19  Interventions for Problem One Long Term Goal   Assessed member for PMH of DM type 2  THN CM Short Term Goal #1   Member will assess blood sugars regularly and document within next 30 days  THN CM Short Term Goal #1 Start Date  01/15/19  Interventions for Short Term Goal #1  Discussed DM with member  THN CM Short Term Goal #2   Member will report appointments for eye examination due to glaucoma and appointment for feet checked within last 6 months to 1 year  [facility] CM Short Term Goal #2 Start Date  01/15/19  Interventions for Short Term Goal #2  Will schedule date and time to complete initial assessment with member      [name] "[name]" [name], RN-BSN  [place] Management Coordinator  [phone] [email or web address]

## 2019-01-16 ENCOUNTER — Ambulatory Visit: Payer: Self-pay

## 2019-01-17 ENCOUNTER — Other Ambulatory Visit: Payer: Self-pay

## 2019-01-17 ENCOUNTER — Encounter: Payer: Self-pay | Admitting: Orthopedic Surgery

## 2019-01-17 ENCOUNTER — Ambulatory Visit (INDEPENDENT_AMBULATORY_CARE_PROVIDER_SITE_OTHER): Payer: Medicare Other | Admitting: Orthopedic Surgery

## 2019-01-17 VITALS — BP 148/71 | HR 98 | Temp 97.6°F | Ht 68.5 in | Wt 250.0 lb

## 2019-01-17 DIAGNOSIS — M48061 Spinal stenosis, lumbar region without neurogenic claudication: Secondary | ICD-10-CM

## 2019-01-17 DIAGNOSIS — M1710 Unilateral primary osteoarthritis, unspecified knee: Secondary | ICD-10-CM

## 2019-01-17 NOTE — Progress Notes (Signed)
  CT scan lumbar spine  CLINICAL DATA:  58 year old female with low back pain radiating down both legs. History of fall 2 years ago.   EXAM: CT LUMBAR SPINE WITHOUT CONTRAST   TECHNIQUE: Multidetector CT imaging of the lumbar spine was performed without intravenous contrast administration. Multiplanar CT image reconstructions were also generated.   COMPARISON:  Lumbar spine radiograph dated 07/17/2017 and CT of the abdomen pelvis dated 01/27/2014   FINDINGS: Segmentation: 5 lumbar type vertebrae.   Alignment: Normal.   Vertebrae: No acute fracture or focal pathologic process.   Paraspinal and other soft tissues: Negative.   Disc levels: Minimal disc space narrowing at L5-S1. The central canal and neural foramina are patent.   Mild atherosclerotic calcification of the aorta.   IMPRESSION: No acute lumbar spine pathology.     Electronically Signed   By: Anner Crete M.D.   On: 01/15/2019 13:03  Imaging report reviewed.  Does not appear to have any major operative lumbar disc pathology she does have some disc space narrowing at L5-S1 without spinal stenosis or compressive lesion  With that ruled out as a major cause of pain.  Her knee films show bilateral osteoarthritis of the knee with valgus deformity which would benefit from surgical intervention with total knee arthroplasties if  Social situation can be resolved and medically and mentally patient can tolerate surgery  Face-to-face time 10 minutes with greater than 50% of the time spent on counseling the patient regarding her options results and further follow-up care  Encounter Diagnoses  Name Primary?  . Primary localized osteoarthritis of knee Yes  . Degenerative lumbar spinal stenosis    Similar to Dr. Nicki Reaper regarding patient social services issues and social problems as well as medical problems to determine if she would be a candidate for surgery

## 2019-01-21 NOTE — Telephone Encounter (Signed)
This encounter was created in error - please disregard.

## 2019-01-22 ENCOUNTER — Other Ambulatory Visit: Payer: Self-pay

## 2019-01-22 NOTE — Patient Outreach (Addendum)
[facility]) Care Management  01/22/2019  [name] 11/20/1960 [id number]   Unsuccessful Outreach x 1.  Called member at preferred number with plans to complete initial assessment as agreed with member and member answered phone. HIPPA identifiers verified. Member stated she is unable to complete initial assessment as her Aide is there to provided transportation for member and member is currently in process of taking care of business.   Member confirmed that she received [facility] package and stated that she has placed Nurse call line magnet on the refrigerator.   Member verbalized agreement with completing initial assessment on Friday morning.   Will send unsuccessful Outreach Letter. Will send welcome letter. Will contact member in 2 days.   [name] "[name]" [name], RN-BSN  [facility] Care Management  Community Care Management Coordinator  [phone] [email or web address]

## 2019-01-23 ENCOUNTER — Other Ambulatory Visit: Payer: Self-pay

## 2019-01-23 ENCOUNTER — Ambulatory Visit: Payer: Self-pay | Admitting: Cardiothoracic Surgery

## 2019-01-24 ENCOUNTER — Other Ambulatory Visit: Payer: Self-pay

## 2019-01-24 NOTE — Patient Outreach (Addendum)
[facility]) Care Management  01/24/2019   [name] 07-30-1960 [id number]  Subjective:  Today's Vitals   01/24/19 1059  PainSc: 0-No pain    Objective: 58 year old female. PMH:  HTN; OSA has CPAP; GERD; IBS; Dysphagia; DM type 2; Morbid obesity; Anxiety; Mild Mental Retardation; Glaucoma; Knee/back pain; Diarrhea; Migraines; Constipation; Current Smoker of 1/2 pack per day and education provided; HLD; Hypokalemia.  Current Medications:  Current Outpatient Medications  Medication Sig Dispense Refill  . atorvastatin (LIPITOR) 10 MG tablet Take 1 tablet (10 mg total) by mouth daily. 30 tablet 5  . benztropine (COGENTIN) 1 MG tablet Take 1 tablet (1 mg total) by mouth daily. 30 tablet 2  . calcium-vitamin D (OSCAL WITH D) 500-200 MG-UNIT per tablet Take 1 tablet by mouth 3 (three) times daily.      . diclofenac (VOLTAREN) 75 MG EC tablet Take 1 tablet (75 mg total) by mouth 2 (two) times daily. 60 tablet 4  . dicyclomine (BENTYL) 10 MG capsule TAKE 1 CAPSULE BY MOUTH FOUR TIMES DAILY BEFORE MEALS AND AT BEDTIME FOR LOOSE STOOLS.HOLD FOR CONSTIPATION 120 capsule 5  . lamoTRIgine (LAMICTAL) 100 MG tablet Take 1 tablet (100 mg total) by mouth 2 (two) times daily. 60 tablet 2  . lisinopril (ZESTRIL) 2.5 MG tablet TAKE (1) TABLET BY MOUTH EACH MORNING. 30 tablet 5  . paliperidone (INVEGA) 9 MG 24 hr tablet Take 1 tablet (9 mg total) by mouth daily. 30 tablet 2  . potassium chloride (K-DUR) 10 MEQ tablet Take 1 tablet (10 mEq total) by mouth 2 (two) times daily. 60 tablet 5  . propranolol (INDERAL) 10 MG tablet Take 1 tablet (10 mg total) by mouth 3 (three) times daily. 90 tablet 5  . RABEprazole (ACIPHEX) 20 MG tablet TAKE 1 TABLET BY MOUTH TWICE A DAY. 60 tablet 5  . sertraline (ZOLOFT) 100 MG tablet Take 1 tablet (100 mg total) by mouth daily. 30 tablet 2  . torsemide (DEMADEX) 20 MG tablet TAKE (1) TABLET BY MOUTH EACH MORNING. 30 tablet 5  . traZODone (DESYREL) 100 MG  tablet TAKE 2 TABLETS('200MG'$ ) BY MOUTH AT BEDTIME. 60 tablet 0  . fluticasone (FLONASE) 50 MCG/ACT nasal spray Place 2 sprays into both nostrils daily for 7 days. (Patient not taking: Reported on 11/19/2018) 1 g 2   No current facility-administered medications for this visit.     Functional Status:  In your present state of health, do you have any difficulty performing the following activities: 01/24/2019  Hearing? Y  Vision? N  Difficulty concentrating or making decisions? Y  Comment member states she has trouble concentrating at times and she relaxes and tries to do her best  Walking or climbing stairs? Y  Comment walks with a cane or walker  Dressing or bathing? N  Doing errands, shopping? Y  Comment PCS Aide assists with shopping and SW Case Worker assists with paying bills and transportation  Some recent data might be hidden    Fall/Depression Screening: Fall Risk  01/24/2019 08/19/2018 03/18/2018  Falls in the past year? - 1 No  Number falls in past yr: 1 1 -  Injury with Fall? 1 1 -  Risk for fall due to : History of fall(s);Impaired balance/gait;Orthopedic patient Impaired balance/gait;Impaired mobility;History of fall(s) -  Follow up Falls evaluation completed;Education provided;Falls prevention discussed Falls prevention discussed -   PHQ 2/9 Scores 01/24/2019 08/19/2018 03/18/2018 09/26/2017 07/31/2016 07/31/2016 03/03/2014  PHQ - 2 Score 2 0 0  $'2 1 1 'K$ 0  PHQ- 9 Score 5 - - 8 - - -   THN CM Care Plan Problem One     Most Recent Value  Care Plan Problem One  knowledge deficit related to chronic disease: HTN management and symptoms  Role Documenting the Problem One  Care Management Coordinator  Care Plan for Problem One  Active  THN Long Term Goal   Member will not experience Hypertensive episode for next 60 days as manifested by member's verbal report and documentation on blood pressures  THN Long Term Goal Start Date  01/24/19  Interventions for Problem One Long Term Goal  Assessed  member for past medical history of blood pressure,  assessed member's knowledge of current blood pressure  THN CM Short Term Goal #1   Member will document blood pressure when taken at Provider office for next 30 days  THN CM Short Term Goal #1 Start Date  01/24/19  THN CM Short Term Goal #1 Met Date  -- [pt. does not check levels. last A1c 6.6. no DM medications]  Interventions for Short Term Goal #1  Discussed that member will receive [employer] calendar for member to document blood pressures  THN CM Short Term Goal #2   Member will call her insurance company to request a scale within next 30 days for weight evaluation  THN CM Short Term Goal #2 Start Date  01/24/19  Interventions for Short Term Goal #2  Initial assessment completed,  assessed member for weight loss goal as BMI >25      Assessment: Received referral from [place] on 12-31-2018 requesting to follow up with member due to DM and HTN  Called member at preferred number and member answered phone. Introduced self and explained reason for the call. HIPPA identifiers verified. Member was able to participate in completion of initial assessment.   RN CM will follow up with member due to HTN as member states she does not check her blood sugars at home and member's last A1c 6.6. Member does not take DM medications.  Member states she lives alone and has a [employer] Aide who assists with meal preparation, cleaning the home; shopping and member states she has assistance of SW Case Manager to pay bills and to provide transportation to appointments.  HTN: Assessed member's knowledge of HTN and asked member for knowledge of current blood pressure. Member unable to provide information. Education provided and member verbalized agreement for RN CM to continue education. Member verbalized goal to lose weight to assist with improving her health. Care Plan updated to reflect. Member stated she will call her insurance company to request a scale. Member's current  weight 250 lbs and BMI 37.46 as documented.  Appointments: Member states she followed up with her PCP 01-06-2019. Member states she will be scheduling knee replacement surgery with Dr. Aline Brochure, Ortho. Member follows up for nutrition with Tamsen Roers. Lysbeth Penner is [employer] SW Tourist information centre manager who assists with paying bills and transportation.  Plan: Will send copy of assessment to member PCP to include care plan, medications, fall and depression risk assessment. Will follow up with member next month for HTN education.  [name] "[name]" [name], RN-BSN  [facility] Care Management  Community Care Management Coordinator  [phone] [email or web address]

## 2019-01-26 ENCOUNTER — Telehealth: Payer: Self-pay | Admitting: Family Medicine

## 2019-01-26 NOTE — Telephone Encounter (Signed)
Nurses Please contact the patient social worker (I believe it is [name]-under demographics-please confirm) Find out from the social worker when would be a good time to speak with her and what phone number should try (Nurses if I am not busy we could do this closer to lunchtime and I could speak with the social worker) I would like to speak with the social worker regarding [name]'s osteoarthritis of the knees and what Dr. Aline Brochure is recommending

## 2019-01-27 ENCOUNTER — Ambulatory Visit (INDEPENDENT_AMBULATORY_CARE_PROVIDER_SITE_OTHER): Payer: Medicare Other | Admitting: Family Medicine

## 2019-01-27 ENCOUNTER — Other Ambulatory Visit: Payer: Self-pay

## 2019-01-27 DIAGNOSIS — M170 Bilateral primary osteoarthritis of knee: Secondary | ICD-10-CM | POA: Insufficient documentation

## 2019-01-27 NOTE — Telephone Encounter (Signed)
Left message to return call. (contacted Jennifers work number)

## 2019-01-27 NOTE — Telephone Encounter (Signed)
[name] (pt caseworker) returned call. [name] will be in her office until lunchtime today. [name] would rather Dr.Scott call her office number which is [phone] ext [phone]. Caseworker has lunch from 12-1. May also contact [name] on cell phone but she may have a client in the car with her and she will not be able to answer.

## 2019-01-27 NOTE — Progress Notes (Signed)
I connected with  [name] on 01/27/19 by a video enabled telemedicine application and verified that I am speaking with the correct person using two identifiers.   I discussed the limitations of evaluation and management by telemedicine. The patient expressed understanding and agreed to proceed.  Multiple attempts were made to talk with her social worker unable to get through on the phone regarding this  We will be discussing whether or not to proceed forward with knee replacements for her severe osteoarthritis  I was finally able to get in contact with the patient and Education officer, museum.  We discussed the severe osteoarthritis.  It is felt that his best to do a face-to-face visit.  And tried to make sure that the patient understands what is facing her with a surgery.  12 minutes was spent regarding the above phone call unable to do video.

## 2019-01-30 NOTE — Telephone Encounter (Signed)
I apologize, the number is [phone]. I do apologize. I have called and left a message for case worker to return call

## 2019-01-30 NOTE — Telephone Encounter (Signed)
[name] returned call. [name] states that she can speak with provider between 4 and 5 this evening or first thing in the morning. She will be in her office at 8 am. [name] states that provider may call her cell phone at [phone] but if she is with a client she will tell provider she will need to call him back.

## 2019-01-30 NOTE — Telephone Encounter (Signed)
Nurses I tried to reach her during the lunch hour and this phone number [phone] extension [phone] would not allow me through during the lunch hour.  Please find out from the patient if she would be willing for me to call her closer to the lunch hour at this number or her cell number-another lipids are profound approximately [phone]?  I could do this on Friday or early next week what ever works best for the Education officer, museum

## 2019-01-31 NOTE — Telephone Encounter (Signed)
I did discuss with the social worker what is going on with patient I would like for Ayaana to have an appointment to be seen either the week of August 3 or the week of August 10 in the office for discussion of osteoarthritis and possible knee surgery Please call the social worker [name] she states she will be in the office on Friday till 9 AM and she will be available by cell phone but if you are unable to connect with her please leave a message and she will call back thank you

## 2019-01-31 NOTE — Telephone Encounter (Signed)
Scheduled appointment with [name] (case worker)

## 2019-02-10 ENCOUNTER — Other Ambulatory Visit: Payer: Self-pay | Admitting: Family Medicine

## 2019-02-10 ENCOUNTER — Other Ambulatory Visit (HOSPITAL_COMMUNITY): Payer: Self-pay | Admitting: Psychiatry

## 2019-02-11 ENCOUNTER — Telehealth: Payer: Self-pay | Admitting: Family Medicine

## 2019-02-11 NOTE — Telephone Encounter (Signed)
Please schedule follow-up visit for this patient, may have 3 refills each Typically you have to work through the Education officer, museum to help set this up

## 2019-02-11 NOTE — Telephone Encounter (Signed)
Prescreening confirmed patient had headache and cold appointment was changed to phone visit.

## 2019-02-12 ENCOUNTER — Telehealth: Payer: Self-pay

## 2019-02-12 ENCOUNTER — Encounter: Payer: Self-pay | Admitting: Family Medicine

## 2019-02-12 ENCOUNTER — Other Ambulatory Visit: Payer: Self-pay

## 2019-02-12 ENCOUNTER — Ambulatory Visit (INDEPENDENT_AMBULATORY_CARE_PROVIDER_SITE_OTHER): Payer: Medicare Other | Admitting: Family Medicine

## 2019-02-12 VITALS — BP 124/80 | Temp 97.6°F | Ht 68.5 in | Wt 253.0 lb

## 2019-02-12 DIAGNOSIS — E119 Type 2 diabetes mellitus without complications: Secondary | ICD-10-CM | POA: Diagnosis not present

## 2019-02-12 DIAGNOSIS — M170 Bilateral primary osteoarthritis of knee: Secondary | ICD-10-CM

## 2019-02-12 DIAGNOSIS — E785 Hyperlipidemia, unspecified: Secondary | ICD-10-CM | POA: Diagnosis not present

## 2019-02-12 DIAGNOSIS — I10 Essential (primary) hypertension: Secondary | ICD-10-CM | POA: Diagnosis not present

## 2019-02-12 NOTE — Progress Notes (Signed)
Subjective:    Patient ID: [name], female    DOB: 20-Mar-1961, 58 y.o.   MRN: [id number]  HPIpt arrives with social worker [name] to discuss left knee replacement. Pt has seen Dr. Aline Brochure for her knee.  Extremely nice patient Patient has significant osteoarthritis of both knees.  She also has a lot of back pain and discomfort.  She states is difficult to walk around she uses a cane to walk around her gait is somewhat unsteady.  The patient is interested in getting her knee fixed.  She also has mild MR and also some significant underlying psychologic issues. The patient denies any chest pain shortness of breath with activity.  She does state that she can tolerate the pain but it is very severe.  She does use anti-inflammatories and denies side effects.  Notes from the orthopedist as well as x-rays were reviewed with the patient Needs refill on flonase.  Review of Systems  Constitutional: Negative for activity change, appetite change and fatigue.  HENT: Negative for congestion and rhinorrhea.   Respiratory: Negative for cough and shortness of breath.   Cardiovascular: Negative for chest pain and leg swelling.  Gastrointestinal: Negative for abdominal pain and diarrhea.  Endocrine: Negative for polydipsia and polyphagia.  Musculoskeletal: Positive for arthralgias. Negative for back pain.  Skin: Negative for color change.  Neurological: Negative for dizziness and weakness.  Psychiatric/Behavioral: Negative for behavioral problems and confusion.       Objective:   Physical Exam Vitals signs reviewed.  Constitutional:      General: She is not in acute distress. HENT:     Head: Normocephalic and atraumatic.  Eyes:     General:        Right eye: No discharge.        Left eye: No discharge.  Neck:     Trachea: No tracheal deviation.  Cardiovascular:     Rate and Rhythm: Normal rate and regular rhythm.     Heart sounds: Normal heart sounds. No murmur.  Pulmonary:   Effort: Pulmonary effort is normal. No respiratory distress.     Breath sounds: Normal breath sounds.  Lymphadenopathy:     Cervical: No cervical adenopathy.  Skin:    General: Skin is warm and dry.  Neurological:     Mental Status: She is alert.     Coordination: Coordination normal.  Psychiatric:        Behavior: Behavior normal.    Significant osteoarthritis noted in both knees worse on the left than the right no swelling in the ankles       Assessment & Plan:  Severe end-stage osteoarthritis She would benefit from having surgery We will communicate with her orthopedist regarding who in [place] they would recommend I believe that she could physically do the surgery but I believe during this phase of the COVID it would be very difficult for her to do the surgery because she would have to go to a rehab center plus also currently rehab centers would not allow for any visitors which would put undue mental strain on her.  I think it would be best for the patient to do labs make sure her medical issues are stable then refer her to orthopedics with most likely of surgery occurring around February or March rather than currently  25 minutes was spent with the patient.  This statement verifies that 25 minutes was indeed spent with the patient.  More than 50% of this visit-total duration of the visit-was  spent in counseling and coordination of care. The issues that the patient came in for today as reflected in the diagnosis (s) please refer to documentation for further details.

## 2019-02-12 NOTE — Telephone Encounter (Signed)
Patient actually doing well today we saw her as an inpatient in person

## 2019-02-12 NOTE — Telephone Encounter (Addendum)
Requests from [facility] for medications Rabeprazole and Dicyclomine.   [facility] said pt has requested meds through them.   I called [facility] since they package meds for pt and spoke to Ed, the pharmacist.  She gets her meds for $1.30 each for 30 days.  Received a call back from Ed and he said pt does not have to pay that fee, social services is in charge.  I will attempt to call her social worker.   I called Foye Spurling on mobile [phone] and left VM for a return call.

## 2019-02-13 ENCOUNTER — Other Ambulatory Visit: Payer: Self-pay

## 2019-02-13 ENCOUNTER — Other Ambulatory Visit (HOSPITAL_COMMUNITY): Payer: Self-pay | Admitting: Psychiatry

## 2019-02-13 LAB — HEPATIC FUNCTION PANEL
ALT: 13 IU/L (ref 0–32)
AST: 18 IU/L (ref 0–40)
Albumin: 4.1 g/dL (ref 3.8–4.9)
Alkaline Phosphatase: 94 IU/L (ref 39–117)
Bilirubin Total: <0.2 mg/dL (ref 0.0–1.2)
Bilirubin, Direct: 0.07 mg/dL (ref 0.00–0.40)
Total Protein: 6.8 g/dL (ref 6.0–8.5)

## 2019-02-13 LAB — BASIC METABOLIC PANEL
BUN/Creatinine Ratio: 7 — ABNORMAL LOW (ref 9–23)
BUN: 8 mg/dL (ref 6–24)
CO2: 27 mmol/L (ref 20–29)
Calcium: 9.1 mg/dL (ref 8.7–10.2)
Chloride: 95 mmol/L — ABNORMAL LOW (ref 96–106)
Creatinine, Ser: 1.09 mg/dL — ABNORMAL HIGH (ref 0.57–1.00)
GFR calc Af Amer: 66 mL/min/{1.73_m2} (ref >59)
GFR calc non Af Amer: 57 mL/min/{1.73_m2} — ABNORMAL LOW (ref >59)
Glucose: 73 mg/dL (ref 65–99)
Potassium: 4.4 mmol/L (ref 3.5–5.2)
Sodium: 140 mmol/L (ref 134–144)

## 2019-02-13 LAB — LIPID PANEL
Chol/HDL Ratio: 2.1 ratio (ref 0.0–4.4)
Cholesterol, Total: 109 mg/dL (ref 100–199)
HDL: 53 mg/dL (ref >39)
LDL Calculated: 38 mg/dL (ref 0–99)
Triglycerides: 89 mg/dL (ref 0–149)
VLDL Cholesterol Cal: 18 mg/dL (ref 5–40)

## 2019-02-13 LAB — MICROALBUMIN / CREATININE URINE RATIO
Creatinine, Urine: 46.2 mg/dL
Microalb/Creat Ratio: <6 mg/g creat (ref 0–29)
Microalbumin, Urine: <3 ug/mL

## 2019-02-13 LAB — HEMOGLOBIN A1C
Est. average glucose Bld gHb Est-mCnc: 131 mg/dL
Hgb A1c MFr Bld: 6.2 % — ABNORMAL HIGH (ref 4.8–5.6)

## 2019-02-13 MED ORDER — LAMOTRIGINE 100 MG PO TABS: 100.0000 mg | ORAL_TABLET | Freq: Two times a day (BID) | ORAL | 2 refills | Status: DC

## 2019-02-13 MED ORDER — BENZTROPINE MESYLATE 1 MG PO TABS: 1.0000 mg | ORAL_TABLET | Freq: Every day | ORAL | 2 refills | Status: DC

## 2019-02-13 MED ORDER — PALIPERIDONE ER 9 MG PO TB24: 9.0000 mg | ORAL_TABLET | Freq: Every day | ORAL | 2 refills | Status: DC

## 2019-02-13 NOTE — Telephone Encounter (Signed)
Called work number and left Vm for a return call from [place].

## 2019-02-13 NOTE — Patient Outreach (Signed)
[facility]) Care Management  02/13/2019  [name] 1961/07/01 [id number]   Unsuccessful Outreach x 1.   Called member at preferred number to complete initial assessment and no answer. HIPPA compliant voice mail message left introducing self and requested return call when available.    Will call member within 3-4 business days.

## 2019-02-13 NOTE — Telephone Encounter (Signed)
[name] returned call and said pt gets her meds [facility] at [facility].  No change needed.  Faxed the request back to [facility]  That pt is getting elsewhere.

## 2019-02-14 ENCOUNTER — Other Ambulatory Visit (HOSPITAL_COMMUNITY): Payer: Self-pay | Admitting: Psychiatry

## 2019-02-14 ENCOUNTER — Other Ambulatory Visit: Payer: Self-pay | Admitting: *Deleted

## 2019-02-14 MED ORDER — TRAZODONE HCL 100 MG PO TABS: 200.0000 mg | ORAL_TABLET | Freq: Every day | ORAL | 2 refills | Status: DC

## 2019-02-14 MED ORDER — SERTRALINE HCL 100 MG PO TABS: 100.0000 mg | ORAL_TABLET | Freq: Every day | ORAL | 2 refills | Status: DC

## 2019-02-19 ENCOUNTER — Other Ambulatory Visit: Payer: Self-pay

## 2019-02-19 ENCOUNTER — Ambulatory Visit: Payer: Medicare Other

## 2019-02-19 NOTE — Patient Outreach (Signed)
[facility]) Care Management  02/19/2019  [name] 1960-12-29 [id number]   Called member at preferred number for monthly follow up due to HTN and to update care plan and to discuss transition of care to [facility];however, no answer. Unable to leave HIPPA compliant voicemail message as "memory full".   Will send unsuccessful outreach letter. Will call member within 3-4 business days.  [name] "[name]" [name], RN-BSN  [facility] Care Management  Community Care Management Coordinator  [phone] [email or web address]

## 2019-02-25 ENCOUNTER — Other Ambulatory Visit: Payer: Self-pay

## 2019-02-25 NOTE — Patient Outreach (Signed)
[facility]) Care Management  02/25/2019  DEIDRE CARINO Jun 02, 1961 [id number]  Called member at preferred number and member answered phone. Introduced self and explained reason for the call. HIPPA identifiers verified.    Member confirmed she received Copy;  However, has not completed consents.  HTN: Last BP recorded via PCP on 02-12-2019 was 124/80, temp 97.6, weight 253 lbs and BMI 37.9 Member denies any current BP concerns. Member states she is practicing documenting BP's. Member states she did not receive her [place]. Will resend Calendar to [facility] home.  Member states she is scheduled for a Nutritional virtual appointment on February 27, 2019 at 1330. Member will meet with Jearld Fenton.   PCP: Last PCP visit 02-12-2019 and next visit scheduled Sept. 29, 2020.   Member states she has a GI appointment scheduled tomorrow at 1330 and states "my case-worker will drive me".   Member states she is scheduled for surgery in April 2021.   Member has PCS Aide assistance. Member states her medications are the same except she has been prescribed a "little green pill".  Member denies any current needs at this time. Discussed with member that care will be transitioned to [facility] and member verbalized understanding and agreement. Member verbalized appreciation of care received.    Will send referral to [place] to follow up for HTN. Will send discipline closure letter to PCP. Will close case at this time. No other [place] [facility] Care Team members involved in member's care at this time.  THN CM Care Plan Problem One     Most Recent Value  Care Plan Problem One  knowledge deficit related to chronic disease: HTN management and symptoms  Role Documenting the Problem One  Care Management Coordinator  Care Plan for Problem One  Active  THN Long Term Goal   Member will not experience Hypertensive episode for next 60 days as manifested by member's verbal report and  documentation on blood pressures  THN Long Term Goal Start Date  01/24/19  Interventions for Problem One Long Term Goal  Discussed member last BP taken at PCP office 124/80  THN CM Short Term Goal #1   Member will document blood pressure when taken at Provider office for next 30 days  THN CM Short Term Goal #1 Start Date  01/24/19  [facility] CM Short Term Goal #1 Met Date  02/25/19  Interventions for Short Term Goal #1  Assessed if member documented BP and member states she wrote it down on a piece of paper  THN CM Short Term Goal #2   Member will call her insurance company to request a scale within next 30 days for weight evaluation  THN CM Short Term Goal #2 Start Date  01/24/19  [facility] CM Short Term Goal #2 Met Date  02/25/19  Interventions for Short Term Goal #2  Discussed again with member      [name] "[name]" [name], RN-BSN  [facility]  Community Care Management Coordinator  [phone] [email or web address]

## 2019-02-25 NOTE — Progress Notes (Signed)
Primary Care Physician:  Kathyrn Drown, MD Primary GI: Dr. Gala Romney   Chief Complaint  Patient presents with  . Diarrhea    "all the time", ocas blood in stool  . Gastroesophageal Reflux    HPI:   [name] is a 58 y.o. female presenting today with a history of GERD, IBS-D, colonoscopy due in 2023 (history of adenomas in past). Reports of dysphagia and refractory GERD to PPI BID at last visit. Prior improvement with empiric dilatation in past. Here to discuss symptoms and possible EGD/dilatation. Difficult historian.   Some solid food dysphagia. Points to neck and says it hurts when swallowing at times. Aciphex BID not helping as much. Hurting all over. Worried about surgery that she needs to have by Dr. Aline Brochure. Not scheduled yet.   Stool is soft like soft-serve. Mainly soft stools, not watery. Present for last 4 weeks. Yesterday 6 times. Has seen some blood in stool at times. Stool sometimes feels unproductive. Will have a BM then 10 minutes later back to have another one. Believes Bentyl makes her constipated. Some days will be constipated, every now and then. Some rectal itching.   Past Medical History:  Diagnosis Date  . Anxiety   . Diabetes mellitus   . Diabetes mellitus, type II ([place])   . HTN (hypertension)   . Hyperglycemia   . IBS (irritable bowel syndrome)   . Lung nodules    right, followed by PCP, PET 11/2011  . Mental retardation   . MI (myocardial infarction) ([place])   . Migraines   . Sleep apnea     Past Surgical History:  Procedure Laterality Date  . ABDOMINAL HYSTERECTOMY    . COLONOSCOPY  11/2007   hyperplastic polyps, prior hx of adenomas   . COLONOSCOPY  05/2010   incomplete due to poor prep, hyperplastic rectal polyp  . COLONOSCOPY  05/05/2002   Dimunitive polyps in the rectum and left colon, cold    biopsied/removed.  Scattered few left-sided diverticula.  Regular colonic   mucosa appeared normal  . COLONOSCOPY N/A 05/28/2013   [place]:  mulitple tubular adenomas removed. next tcs 05/2016  . COLONOSCOPY WITH PROPOFOL N/A 09/07/2016   Dr. Gala Romney: For hyperplastic polyps removed. Next colonoscopy March 2023 given history of adenomatous colon polyps in the past.  . ESOPHAGOGASTRODUODENOSCOPY  08/2007   moderate sized hiatal hernia  . ESOPHAGOGASTRODUODENOSCOPY  05/2010   noncritical schatzki ring s/p 60F  . ESOPHAGOGASTRODUODENOSCOPY (EGD) WITH ESOPHAGEAL DILATION N/A 02/06/2013   GHW:EXHBZJ esophagus-s/p dilation up to a 34 [place] size with [facility] dilators.  Hiatal hernia  . ESOPHAGOGASTRODUODENOSCOPY (EGD) WITH PROPOFOL N/A 09/07/2016   Dr. Gala Romney: Normal, status post empiric dilation of the esophagus for history of dysphagia  . EXTERNAL EAR SURGERY     bilateral  . FOOT SURGERY    . GLAUCOMA SURGERY    . MALONEY DILATION N/A 09/07/2016   Procedure: Venia Minks DILATION;  Surgeon: Daneil Dolin, MD;  [facility]: [facility];  Service: Endoscopy;  Laterality: N/A;  . POLYPECTOMY  09/07/2016   Procedure: POLYPECTOMY;  Surgeon: Daneil Dolin, MD;  Location: [facility];  Service: Endoscopy;;  sigmoid colon x4  . small bowel capsule  10/2007   normal    Current Outpatient Medications  Medication Sig Dispense Refill  . atorvastatin (LIPITOR) 10 MG tablet TAKE 1 TABLET BY MOUTH ONCE A DAY. 30 tablet 0  . benztropine (COGENTIN) 1 MG tablet Take 1 tablet (1 mg  total) by mouth daily. 90 tablet 2  . calcium-vitamin D (OSCAL WITH D) 500-200 MG-UNIT per tablet Take 1 tablet by mouth 3 (three) times daily.      Marland Kitchen dicyclomine (BENTYL) 10 MG capsule TAKE 1 CAPSULE BY MOUTH FOUR TIMES DAILY BEFORE MEALS AND AT BEDTIME FOR LOOSE STOOLS.HOLD FOR CONSTIPATION 120 capsule 5  . lamoTRIgine (LAMICTAL) 100 MG tablet Take 1 tablet (100 mg total) by mouth 2 (two) times daily. 180 tablet 2  . lisinopril (ZESTRIL) 2.5 MG tablet TAKE (1) TABLET BY MOUTH EACH MORNING. 30 tablet 5  . paliperidone (INVEGA) 9 MG 24 hr tablet Take 1 tablet (9 mg total) by mouth  daily. 90 tablet 2  . potassium chloride (K-DUR) 10 MEQ tablet Take 1 tablet (10 mEq total) by mouth 2 (two) times daily. 60 tablet 5  . propranolol (INDERAL) 10 MG tablet Take 1 tablet (10 mg total) by mouth 3 (three) times daily. 90 tablet 5  . RABEprazole (ACIPHEX) 20 MG tablet TAKE 1 TABLET BY MOUTH TWICE A DAY. 60 tablet 5  . sertraline (ZOLOFT) 100 MG tablet Take 1 tablet (100 mg total) by mouth daily. 90 tablet 2  . torsemide (DEMADEX) 20 MG tablet TAKE (1) TABLET BY MOUTH EACH MORNING. 30 tablet 5  . traZODone (DESYREL) 100 MG tablet Take 2 tablets (200 mg total) by mouth at bedtime. 180 tablet 2  . fluticasone (FLONASE) 50 MCG/ACT nasal spray Place 2 sprays into both nostrils daily for 7 days. (Patient not taking: Reported on 11/19/2018) 1 g 2   No current facility-administered medications for this visit.     Allergies as of 02/26/2019 - Review Complete 02/26/2019  Allergen Reaction Noted  . Thorazine [chlorpromazine hcl] Anaphylaxis 10/20/2010  . Acetaminophen Other (See Comments)   . Aspirin Other (See Comments)   . Aspirin-acetaminophen-caffeine Other (See Comments)   . Nsaids Nausea And Vomiting   . Penicillins Nausea And Vomiting   . Tomato Rash 01/17/2012    Family History  Problem Relation Age of Onset  . Stroke Mother   . Heart attack Father   . Schizophrenia Other   . Drug abuse Other   . Alcohol abuse Other   . Colon cancer Other        aunt  . Obesity Other   . COPD Other   . GER disease Other   . Diabetes type II Other   . Anxiety disorder Other   . Depression Other   . Depression Sister   . Schizophrenia Sister   . Liver disease Neg Hx   . Inflammatory bowel disease Neg Hx     Social History   Socioeconomic History  . Marital status: Divorced    Spouse name: Not on file  . Number of children: Not on file  . Years of education: Not on file  . Highest education level: High school graduate  Occupational History  . Occupation: disabled     Fish farm manager: UNEMPLOYED  Social Needs  . Financial resource strain: Not hard at all  . Food insecurity    Worry: Never true    Inability: Never true  . Transportation needs    Medical: No    Non-medical: No  Tobacco Use  . Smoking status: Current Every Day Smoker    Packs/day: 0.25    Years: 40.00    Pack years: 10.00    Types: Cigarettes  . Smokeless tobacco: Never Used  Substance and Sexual Activity  . Alcohol use: No  .  Drug use: No  . Sexual activity: Yes    Partners: Male    Birth control/protection: None    Comment: boyfriend  Lifestyle  . Physical activity    Days per week: 7 days    Minutes per session: 20 min  . Stress: Only a little  Relationships  . Social Herbalist on phone: Not on file    Gets together: Not on file    Attends religious service: Not on file    Active member of club or organization: Not on file    Attends meetings of clubs or organizations: Not on file    Relationship status: Not on file  Other Topics Concern  . Not on file  Social History Narrative  . Not on file    Review of Systems: Gen: Denies fever, chills, anorexia. Denies fatigue, weakness, weight loss.  CV: Denies chest pain, palpitations, syncope, peripheral edema, and claudication. Resp: Denies dyspnea at rest, cough, wheezing, coughing up blood, and pleurisy. GI: see HPI Derm: Denies rash, itching, dry skin Psych: Denies depression, anxiety, memory loss, confusion. No homicidal or suicidal ideation.  Heme: see HPI  Physical Exam: BP 135/81   Pulse 92   Temp (!) 97.1 F (36.2 C) (Oral)   Ht 5\' 9"  (1.753 m)   Wt 251 lb 12.8 oz (114.2 kg)   BMI 37.18 kg/m  General:   Alert and oriented. No distress noted. Pleasant and cooperative.  Head:  Normocephalic and atraumatic. Eyes:  Conjuctiva clear without scleral icterus. Mouth:  Oral mucosa pink and moist.  Abdomen:  +BS, soft, non-tender and non-distended. No rebound or guarding. No HSM or masses noted. Msk:   Limited ROM in lower extremities, uses cane Extremities:  Without edema. Neurologic:  Alert and  oriented x4 Psych:  Alert and cooperative. Normal mood and affect.

## 2019-02-26 ENCOUNTER — Other Ambulatory Visit: Payer: Self-pay

## 2019-02-26 ENCOUNTER — Ambulatory Visit (INDEPENDENT_AMBULATORY_CARE_PROVIDER_SITE_OTHER): Payer: Medicare Other | Admitting: Gastroenterology

## 2019-02-26 ENCOUNTER — Encounter: Payer: Self-pay | Admitting: Gastroenterology

## 2019-02-26 VITALS — BP 135/81 | HR 92 | Temp 97.1°F | Ht 69.0 in | Wt 251.8 lb

## 2019-02-26 DIAGNOSIS — K588 Other irritable bowel syndrome: Secondary | ICD-10-CM | POA: Diagnosis not present

## 2019-02-26 DIAGNOSIS — K625 Hemorrhage of anus and rectum: Secondary | ICD-10-CM | POA: Diagnosis not present

## 2019-02-26 DIAGNOSIS — R1310 Dysphagia, unspecified: Secondary | ICD-10-CM

## 2019-02-26 MED ORDER — HYDROCORTISONE (PERIANAL) 2.5 % EX CREA: 1.0000 "application " | TOPICAL_CREAM | Freq: Two times a day (BID) | CUTANEOUS | 1 refills | Status: DC

## 2019-02-26 NOTE — Patient Instructions (Signed)
Continue Aciphex twice a day, 30 minutes before breakfast and dinner.   We have arranged an upper endoscopy with dilation by Dr. Gala Romney in the near future.  I have sent in a cream to your pharmacy to use at your rectum twice a day for 7 days.   Let's decrease Bentyl to just before breakfast and dinner. I would like for you to start taking Benefiber 2 teaspoons each morning. I am trying to see if we can "even" out your bowel movements to where they are productive and you aren't fluctuating between frequent stools and constipation.  We will see you in 4-6 weeks!  I enjoyed seeing you again today! As you know, I value our relationship and want to provide genuine, compassionate, and quality care. I welcome your feedback. If you receive a survey regarding your visit,  I greatly appreciate you taking time to fill this out. See you next time!  Annitta Needs, PhD, ANP-BC [facility]

## 2019-02-26 NOTE — Assessment & Plan Note (Signed)
58 year old female reporting solid food dysphagia and vague odynophagia in setting of chronic GERD. Aciphex has already been increased to BID. Responded well to empiric dilatation in 2018. At this point, would benefit from repeat dilatation in near future.  Proceed with upper endoscopy/dilatation in the near future with Dr. Gala Romney. The risks, benefits, and alternatives have been discussed in detail with patient. They have stated understanding and desire to proceed.  Propofol due to polypharmacy Continue PPI BID

## 2019-02-26 NOTE — Assessment & Plan Note (Signed)
Intermittent in setting of frequent stools and constipation and clinically consistent with hemorrhoids. Colonoscopy in 2018. Will attempt to achieve a more predictable bowel regimen. Anusol BID for 7 days. Could consider hemorrhoid banding at next appointment.

## 2019-02-26 NOTE — Progress Notes (Signed)
CC'ED TO PCP 

## 2019-02-26 NOTE — Assessment & Plan Note (Signed)
Difficult historian. From report, it sounds like her stools are more soft and not consistent with diarrhea. In fact, she deals with what seems to be a mixed pattern IBS from her description. Cognitive deficits create challenges when obtaining the whole picture. Will decrease Bentyl to BID for now, add Benefiber daily. Hopefully, we can come to a happy medium. Return in 4-6 weeks for close follow-up.

## 2019-02-27 ENCOUNTER — Encounter: Payer: Medicare Other | Attending: Family Medicine | Admitting: Nutrition

## 2019-02-27 ENCOUNTER — Other Ambulatory Visit: Payer: Self-pay

## 2019-02-27 ENCOUNTER — Telehealth: Payer: Self-pay

## 2019-02-27 ENCOUNTER — Encounter: Payer: Self-pay | Admitting: Nutrition

## 2019-02-27 DIAGNOSIS — R1310 Dysphagia, unspecified: Secondary | ICD-10-CM

## 2019-02-27 DIAGNOSIS — E119 Type 2 diabetes mellitus without complications: Secondary | ICD-10-CM

## 2019-02-27 DIAGNOSIS — I10 Essential (primary) hypertension: Secondary | ICD-10-CM | POA: Insufficient documentation

## 2019-02-27 NOTE — Progress Notes (Signed)
Telephone visit, follow up DM Medical Nutrition Therapy:  Appt start time: 1330 end time:  1400     Assessment:  Primary concerns today: Diabetes Type 2. F/u   No real changes. Gained 2 lbs. Still struggles with eating breakfast and lunch. Eats whenever she feels like it and eats whatever she has available. Likes to eat fast food and processed food. Has an aide who helps cook and clean.  Complains of chronic arthritis. Suppose to have knee surgery but can't right now due to COVID 19.      Mental /Cognitive issues are limiting her from making better food choices. Not able to exercise due to knee issues and arthritis. Processed foods are making inflammatory processes worse. Drinks water. Lost 2 lbs.   A1C 6.2%.Not on any medications for Dm at this time.  Lab Results  Component Value Date   HGBA1C 6.2 (H) 02/12/2019   CMP Latest Ref Rng & Units 02/12/2019 02/28/2018 01/17/2018  Glucose 65 - 99 mg/dL 73 89 107(H)  BUN 6 - 24 mg/dL 8 11 12   Creatinine 0.57 - 1.00 mg/dL 1.09(H) 0.98 1.02(H)  Sodium 134 - 144 mmol/L 140 140 142  Potassium 3.5 - 5.2 mmol/L 4.4 4.4 4.3  Chloride 96 - 106 mmol/L 95(L) 96 97  CO2 20 - 29 mmol/L 27 27 29   Calcium 8.7 - 10.2 mg/dL 9.1 9.7 9.0  Total Protein 6.0 - 8.5 g/dL 6.8 - -  Total Bilirubin 0.0 - 1.2 mg/dL <0.2 - -  Alkaline Phos 39 - 117 IU/L 94 - -  AST 0 - 40 IU/L 18 - -  ALT 0 - 32 IU/L 13 - -   Lipid Panel     Component Value Date/Time   CHOL 109 02/12/2019 1442   TRIG 89 02/12/2019 1442   HDL 53 02/12/2019 1442   CHOLHDL 2.1 02/12/2019 1442   CHOLHDL 2.9 07/20/2014 1214   VLDL 21 07/20/2014 1214   LDLCALC 38 02/12/2019 1442    Wt Readings from Last 3 Encounters:  02/26/19 251 lb 12.8 oz (114.2 kg)  02/12/19 253 lb (114.8 kg)  01/17/19 250 lb (113.4 kg)   Ht Readings from Last 3 Encounters:  02/26/19 5\' 9"  (1.753 m)  02/12/19 5' 8.5" (1.74 m)  01/17/19 5' 8.5" (1.74 m)   There is no height or weight on file to calculate  BMI. @BMIFA @ Facility age limit for growth percentiles is 20 years. Facility age limit for growth percentiles is 20 years.  Preferred Learning Style:  Visual, verbal, hands on.  Learning Readiness:  Ready  Change in progress   MEDICATIONS:   DIETARY INTAKE:  24-hr recall:  B ( AM): Skipped. Snk ( AM): L ( PM): Hasn't eaten lunch today.   D ( PM): skipped;   baked chicken, string beans, water Snk ( PM):  Beverages: water   Usual physical activity walking some   Estimated energy needs: 1200  calories 135 g carbohydrates 90 g protein 33 g fat  Progress Towards Goal(s):  In progress.   Nutritional Diagnosis:  NB-1.1 Food and nutrition-related knowledge deficit As related to Diabetes.  As evidenced by  A1C 6.6%..    Intervention:  Nutrition Nutrition and Diabetes education provided on My Plate, CHO counting, meal planning, portion sizes, timing of meals, avoiding snacks between meals unless having a low blood sugar, target ranges for A1C and blood sugars, signs/symptoms and treatment of hyper/hypoglycemia, monitoring blood sugars, taking medications as prescribed, benefits of exercising 30 minutes per day  and prevention of complications of DM. HIgh Fiber Foods.  Goals Eat breakfast daily- Don't skip meals Eat eggs and toast for breakfast and a sandwich and fruit for lunch daily. Increase fresh fruits and vegetables Drink more water. Lose 5 lbs in the next 3 months.  Teaching Method Utilized:  Visual Auditory Hands on  Handouts given during visit include:  The Plate Method   Meal Plan Card  Diabetes Instrucitons.  Barriers to learning/adherence to lifestyle change: CVA, HA, Limited mobility.  Demonstrated degree of understanding via:  Teach Back   Monitoring/Evaluation:  Dietary intake, exercise, , and body weight in 3 month(s).Marland Kitchen

## 2019-02-27 NOTE — Telephone Encounter (Signed)
Pre-op appt 04/29/19 at 9:00am. Letter mailed with procedure instructions.

## 2019-02-27 NOTE — Patient Instructions (Signed)
Goals Eat breakfast daily- Don't skip meals Eat eggs and toast for breakfast and a sandwich and fruit for lunch daily. Increase fresh fruits and vegetables Drink more water. Lose 5 lbs in the next 3 months.

## 2019-02-27 NOTE — Telephone Encounter (Signed)
Called social worker Network engineer [name]), EGD/DIL w/Propofol w/RMR scheduled for 05/01/19 at 1:30pm. [name] requested instructions be mailed to her. Orders entered.

## 2019-03-12 ENCOUNTER — Other Ambulatory Visit: Payer: Self-pay

## 2019-03-17 ENCOUNTER — Other Ambulatory Visit (HOSPITAL_COMMUNITY): Payer: Self-pay | Admitting: Psychiatry

## 2019-03-18 NOTE — Telephone Encounter (Signed)
Call patient or sw for f/u

## 2019-03-22 ENCOUNTER — Inpatient Hospital Stay (HOSPITAL_COMMUNITY)
Admission: EM | Admit: 2019-03-22 | Discharge: 2019-03-25 | DRG: 564 | Disposition: A | Discharge status: Home / Self Care | Payer: Medicare Other | Attending: Internal Medicine | Admitting: Internal Medicine

## 2019-03-22 ENCOUNTER — Inpatient Hospital Stay (HOSPITAL_COMMUNITY): Payer: Medicare Other

## 2019-03-22 ENCOUNTER — Encounter (HOSPITAL_COMMUNITY): Payer: Self-pay | Admitting: Emergency Medicine

## 2019-03-22 ENCOUNTER — Emergency Department (HOSPITAL_COMMUNITY): Payer: Medicare Other

## 2019-03-22 ENCOUNTER — Other Ambulatory Visit: Payer: Self-pay

## 2019-03-22 DIAGNOSIS — Z79899 Other long term (current) drug therapy: Secondary | ICD-10-CM

## 2019-03-22 DIAGNOSIS — R55 Syncope and collapse: Secondary | ICD-10-CM | POA: Diagnosis present

## 2019-03-22 DIAGNOSIS — Z23 Encounter for immunization: Secondary | ICD-10-CM | POA: Diagnosis not present

## 2019-03-22 DIAGNOSIS — Z03818 Encounter for observation for suspected exposure to other biological agents ruled out: Secondary | ICD-10-CM | POA: Diagnosis not present

## 2019-03-22 DIAGNOSIS — R51 Headache: Secondary | ICD-10-CM | POA: Diagnosis not present

## 2019-03-22 DIAGNOSIS — F17200 Nicotine dependence, unspecified, uncomplicated: Secondary | ICD-10-CM | POA: Diagnosis present

## 2019-03-22 DIAGNOSIS — E119 Type 2 diabetes mellitus without complications: Secondary | ICD-10-CM

## 2019-03-22 DIAGNOSIS — I700 Atherosclerosis of aorta: Secondary | ICD-10-CM | POA: Diagnosis not present

## 2019-03-22 DIAGNOSIS — Z886 Allergy status to analgesic agent status: Secondary | ICD-10-CM

## 2019-03-22 DIAGNOSIS — M542 Cervicalgia: Secondary | ICD-10-CM | POA: Diagnosis not present

## 2019-03-22 DIAGNOSIS — Z720 Tobacco use: Secondary | ICD-10-CM | POA: Diagnosis present

## 2019-03-22 DIAGNOSIS — Z90710 Acquired absence of both cervix and uterus: Secondary | ICD-10-CM

## 2019-03-22 DIAGNOSIS — R740 Nonspecific elevation of levels of transaminase and lactic acid dehydrogenase [LDH]: Secondary | ICD-10-CM | POA: Diagnosis present

## 2019-03-22 DIAGNOSIS — Z91018 Allergy to other foods: Secondary | ICD-10-CM

## 2019-03-22 DIAGNOSIS — G4730 Sleep apnea, unspecified: Secondary | ICD-10-CM | POA: Diagnosis not present

## 2019-03-22 DIAGNOSIS — M6282 Rhabdomyolysis: Secondary | ICD-10-CM | POA: Diagnosis present

## 2019-03-22 DIAGNOSIS — R609 Edema, unspecified: Secondary | ICD-10-CM | POA: Diagnosis not present

## 2019-03-22 DIAGNOSIS — M549 Dorsalgia, unspecified: Secondary | ICD-10-CM | POA: Diagnosis not present

## 2019-03-22 DIAGNOSIS — Y92002 Bathroom of unspecified non-institutional (private) residence single-family (private) house as the place of occurrence of the external cause: Secondary | ICD-10-CM

## 2019-03-22 DIAGNOSIS — K589 Irritable bowel syndrome without diarrhea: Secondary | ICD-10-CM | POA: Diagnosis not present

## 2019-03-22 DIAGNOSIS — T796XXA Traumatic ischemia of muscle, initial encounter: Secondary | ICD-10-CM | POA: Diagnosis not present

## 2019-03-22 DIAGNOSIS — W182XXA Fall in (into) shower or empty bathtub, initial encounter: Secondary | ICD-10-CM | POA: Diagnosis present

## 2019-03-22 DIAGNOSIS — Z8249 Family history of ischemic heart disease and other diseases of the circulatory system: Secondary | ICD-10-CM

## 2019-03-22 DIAGNOSIS — S299XXA Unspecified injury of thorax, initial encounter: Secondary | ICD-10-CM | POA: Diagnosis not present

## 2019-03-22 DIAGNOSIS — T796XXD Traumatic ischemia of muscle, subsequent encounter: Secondary | ICD-10-CM | POA: Diagnosis not present

## 2019-03-22 DIAGNOSIS — E1165 Type 2 diabetes mellitus with hyperglycemia: Secondary | ICD-10-CM | POA: Diagnosis not present

## 2019-03-22 DIAGNOSIS — E785 Hyperlipidemia, unspecified: Secondary | ICD-10-CM | POA: Diagnosis present

## 2019-03-22 DIAGNOSIS — F79 Unspecified intellectual disabilities: Secondary | ICD-10-CM

## 2019-03-22 DIAGNOSIS — E876 Hypokalemia: Secondary | ICD-10-CM | POA: Diagnosis present

## 2019-03-22 DIAGNOSIS — G43909 Migraine, unspecified, not intractable, without status migrainosus: Secondary | ICD-10-CM | POA: Diagnosis not present

## 2019-03-22 DIAGNOSIS — I252 Old myocardial infarction: Secondary | ICD-10-CM

## 2019-03-22 DIAGNOSIS — E860 Dehydration: Secondary | ICD-10-CM | POA: Diagnosis present

## 2019-03-22 DIAGNOSIS — F17210 Nicotine dependence, cigarettes, uncomplicated: Secondary | ICD-10-CM | POA: Diagnosis present

## 2019-03-22 DIAGNOSIS — Z880 Allergy status to penicillin: Secondary | ICD-10-CM

## 2019-03-22 DIAGNOSIS — Z20828 Contact with and (suspected) exposure to other viral communicable diseases: Secondary | ICD-10-CM | POA: Diagnosis present

## 2019-03-22 DIAGNOSIS — R0689 Other abnormalities of breathing: Secondary | ICD-10-CM | POA: Diagnosis not present

## 2019-03-22 DIAGNOSIS — R7989 Other specified abnormal findings of blood chemistry: Secondary | ICD-10-CM | POA: Diagnosis present

## 2019-03-22 DIAGNOSIS — F419 Anxiety disorder, unspecified: Secondary | ICD-10-CM | POA: Diagnosis present

## 2019-03-22 DIAGNOSIS — M79604 Pain in right leg: Secondary | ICD-10-CM | POA: Diagnosis not present

## 2019-03-22 DIAGNOSIS — I10 Essential (primary) hypertension: Secondary | ICD-10-CM | POA: Diagnosis present

## 2019-03-22 DIAGNOSIS — G8929 Other chronic pain: Secondary | ICD-10-CM | POA: Diagnosis present

## 2019-03-22 DIAGNOSIS — G9341 Metabolic encephalopathy: Secondary | ICD-10-CM | POA: Diagnosis not present

## 2019-03-22 DIAGNOSIS — N390 Urinary tract infection, site not specified: Secondary | ICD-10-CM | POA: Diagnosis not present

## 2019-03-22 DIAGNOSIS — E161 Other hypoglycemia: Secondary | ICD-10-CM | POA: Diagnosis not present

## 2019-03-22 DIAGNOSIS — S3993XA Unspecified injury of pelvis, initial encounter: Secondary | ICD-10-CM | POA: Diagnosis not present

## 2019-03-22 DIAGNOSIS — F39 Unspecified mood [affective] disorder: Secondary | ICD-10-CM | POA: Diagnosis present

## 2019-03-22 DIAGNOSIS — Z888 Allergy status to other drugs, medicaments and biological substances status: Secondary | ICD-10-CM

## 2019-03-22 DIAGNOSIS — M79605 Pain in left leg: Secondary | ICD-10-CM | POA: Diagnosis not present

## 2019-03-22 DIAGNOSIS — R319 Hematuria, unspecified: Secondary | ICD-10-CM | POA: Diagnosis not present

## 2019-03-22 DIAGNOSIS — Z818 Family history of other mental and behavioral disorders: Secondary | ICD-10-CM

## 2019-03-22 DIAGNOSIS — Z833 Family history of diabetes mellitus: Secondary | ICD-10-CM

## 2019-03-22 DIAGNOSIS — R531 Weakness: Secondary | ICD-10-CM | POA: Diagnosis not present

## 2019-03-22 DIAGNOSIS — R911 Solitary pulmonary nodule: Secondary | ICD-10-CM | POA: Diagnosis present

## 2019-03-22 DIAGNOSIS — R000 Tachycardia, unspecified: Secondary | ICD-10-CM | POA: Diagnosis not present

## 2019-03-22 DIAGNOSIS — M79606 Pain in leg, unspecified: Secondary | ICD-10-CM

## 2019-03-22 DIAGNOSIS — S3991XA Unspecified injury of abdomen, initial encounter: Secondary | ICD-10-CM | POA: Diagnosis not present

## 2019-03-22 DIAGNOSIS — E1121 Type 2 diabetes mellitus with diabetic nephropathy: Secondary | ICD-10-CM

## 2019-03-22 DIAGNOSIS — E162 Hypoglycemia, unspecified: Secondary | ICD-10-CM | POA: Diagnosis not present

## 2019-03-22 LAB — CBC WITH DIFFERENTIAL/PLATELET
Abs Immature Granulocytes: 0.04 10*3/uL (ref 0.00–0.07)
Basophils Absolute: 0 10*3/uL (ref 0.0–0.1)
Basophils Relative: 1 %
Eosinophils Absolute: 0 10*3/uL (ref 0.0–0.5)
Eosinophils Relative: 0 %
HCT: 37.4 % (ref 36.0–46.0)
Hemoglobin: 12.2 g/dL (ref 12.0–15.0)
Immature Granulocytes: 1 %
Lymphocytes Relative: 15 %
Lymphs Abs: 1.2 10*3/uL (ref 0.7–4.0)
MCH: 30.9 pg (ref 26.0–34.0)
MCHC: 32.6 g/dL (ref 30.0–36.0)
MCV: 94.7 fL (ref 80.0–100.0)
Monocytes Absolute: 0.8 10*3/uL (ref 0.1–1.0)
Monocytes Relative: 10 %
Neutro Abs: 5.9 10*3/uL (ref 1.7–7.7)
Neutrophils Relative %: 73 %
Platelets: 196 10*3/uL (ref 150–400)
RBC: 3.95 MIL/uL (ref 3.87–5.11)
RDW: 12.9 % (ref 11.5–15.5)
WBC: 7.9 10*3/uL (ref 4.0–10.5)
nRBC: 0 % (ref 0.0–0.2)

## 2019-03-22 LAB — TROPONIN I (HIGH SENSITIVITY)
Troponin I (High Sensitivity): 46 ng/L — ABNORMAL HIGH (ref <18)
Troponin I (High Sensitivity): 50 ng/L — ABNORMAL HIGH (ref <18)
Troponin I (High Sensitivity): 51 ng/L — ABNORMAL HIGH (ref <18)
Troponin I (High Sensitivity): 54 ng/L — ABNORMAL HIGH (ref <18)

## 2019-03-22 LAB — CBG MONITORING, ED
Glucose-Capillary: 109 mg/dL — ABNORMAL HIGH (ref 70–99)
Glucose-Capillary: 187 mg/dL — ABNORMAL HIGH (ref 70–99)

## 2019-03-22 LAB — COMPREHENSIVE METABOLIC PANEL
ALT: 34 U/L (ref 0–44)
AST: 148 U/L — ABNORMAL HIGH (ref 15–41)
Albumin: 3.7 g/dL (ref 3.5–5.0)
Alkaline Phosphatase: 77 U/L (ref 38–126)
Anion gap: 12 (ref 5–15)
BUN: 14 mg/dL (ref 6–20)
CO2: 28 mmol/L (ref 22–32)
Calcium: 8.5 mg/dL — ABNORMAL LOW (ref 8.9–10.3)
Chloride: 95 mmol/L — ABNORMAL LOW (ref 98–111)
Creatinine, Ser: 0.83 mg/dL (ref 0.44–1.00)
GFR calc Af Amer: >60 mL/min (ref >60)
GFR calc non Af Amer: >60 mL/min (ref >60)
Glucose, Bld: 149 mg/dL — ABNORMAL HIGH (ref 70–99)
Potassium: 2.6 mmol/L — CL (ref 3.5–5.1)
Sodium: 135 mmol/L (ref 135–145)
Total Bilirubin: 0.4 mg/dL (ref 0.3–1.2)
Total Protein: 7.2 g/dL (ref 6.5–8.1)

## 2019-03-22 LAB — URINALYSIS, ROUTINE W REFLEX MICROSCOPIC
Bilirubin Urine: NEGATIVE
Glucose, UA: NEGATIVE mg/dL
Ketones, ur: NEGATIVE mg/dL
Leukocytes,Ua: NEGATIVE
Nitrite: NEGATIVE
Protein, ur: 30 mg/dL — AB
RBC / HPF: >50 RBC/hpf — ABNORMAL HIGH (ref 0–5)
Specific Gravity, Urine: 1.021 (ref 1.005–1.030)
pH: 5 (ref 5.0–8.0)

## 2019-03-22 LAB — MAGNESIUM: Magnesium: 1.7 mg/dL (ref 1.7–2.4)

## 2019-03-22 LAB — ETHANOL: Alcohol, Ethyl (B): <10 mg/dL (ref <10)

## 2019-03-22 LAB — RAPID URINE DRUG SCREEN, HOSP PERFORMED
Amphetamines: NOT DETECTED
Barbiturates: NOT DETECTED
Benzodiazepines: NOT DETECTED
Cocaine: NOT DETECTED
Opiates: NOT DETECTED
Tetrahydrocannabinol: NOT DETECTED

## 2019-03-22 LAB — TSH: TSH: 4.492 u[IU]/mL (ref 0.350–4.500)

## 2019-03-22 LAB — VITAMIN B12: Vitamin B-12: 133 pg/mL — ABNORMAL LOW (ref 180–914)

## 2019-03-22 LAB — ACETAMINOPHEN LEVEL: Acetaminophen (Tylenol), Serum: <10 ug/mL — ABNORMAL LOW (ref 10–30)

## 2019-03-22 LAB — PHOSPHORUS: Phosphorus: 3.7 mg/dL (ref 2.5–4.6)

## 2019-03-22 LAB — CK: Total CK: 8109 U/L — ABNORMAL HIGH (ref 38–234)

## 2019-03-22 LAB — FOLATE: Folate: 7.4 ng/mL (ref >5.9)

## 2019-03-22 LAB — SARS CORONAVIRUS 2 BY RT PCR (HOSPITAL ORDER, PERFORMED IN ~~LOC~~ HOSPITAL LAB): SARS Coronavirus 2: NEGATIVE

## 2019-03-22 LAB — SALICYLATE LEVEL: Salicylate Lvl: <7 mg/dL (ref 2.8–30.0)

## 2019-03-22 MED ORDER — ADULT MULTIVITAMIN W/MINERALS CH
1.0000 | ORAL_TABLET | Freq: Every day | ORAL | Status: DC
  Administered 2019-03-22 – 2019-03-25 (×4): 1 via ORAL
  Filled 2019-03-22 (×4): qty 1

## 2019-03-22 MED ORDER — IOHEXOL 300 MG/ML  SOLN
100.0000 mL | Freq: Once | INTRAMUSCULAR | Status: AC | PRN
  Administered 2019-03-22: 100 mL via INTRAVENOUS

## 2019-03-22 MED ORDER — PROPRANOLOL HCL 20 MG PO TABS
10.0000 mg | ORAL_TABLET | Freq: Three times a day (TID) | ORAL | Status: DC
  Administered 2019-03-22 – 2019-03-25 (×11): 10 mg via ORAL
  Filled 2019-03-22 (×11): qty 1

## 2019-03-22 MED ORDER — SERTRALINE HCL 50 MG PO TABS
100.0000 mg | ORAL_TABLET | Freq: Every day | ORAL | Status: DC
  Administered 2019-03-22 – 2019-03-25 (×4): 100 mg via ORAL
  Filled 2019-03-22 (×4): qty 2

## 2019-03-22 MED ORDER — SODIUM CHLORIDE 0.9 % IV SOLN
INTRAVENOUS | Status: DC
  Administered 2019-03-22 – 2019-03-23 (×4): via INTRAVENOUS

## 2019-03-22 MED ORDER — BENZTROPINE MESYLATE 1 MG PO TABS
1.0000 mg | ORAL_TABLET | Freq: Every day | ORAL | Status: DC
  Administered 2019-03-22 – 2019-03-25 (×4): 1 mg via ORAL
  Filled 2019-03-22 (×4): qty 1

## 2019-03-22 MED ORDER — CIPROFLOXACIN IN D5W 400 MG/200ML IV SOLN: 400.0000 mg | Freq: Two times a day (BID) | INTRAVENOUS | Status: DC

## 2019-03-22 MED ORDER — KETOROLAC TROMETHAMINE 30 MG/ML IJ SOLN
30.0000 mg | Freq: Four times a day (QID) | INTRAMUSCULAR | Status: DC | PRN
  Administered 2019-03-22 – 2019-03-24 (×2): 30 mg via INTRAVENOUS
  Filled 2019-03-22 (×2): qty 1

## 2019-03-22 MED ORDER — INFLUENZA VAC SPLIT QUAD 0.5 ML IM SUSY
0.5000 mL | PREFILLED_SYRINGE | INTRAMUSCULAR | Status: AC
  Administered 2019-03-23: 0.5 mL via INTRAMUSCULAR
  Filled 2019-03-22: qty 0.5

## 2019-03-22 MED ORDER — TRAZODONE HCL 50 MG PO TABS
50.0000 mg | ORAL_TABLET | Freq: Every day | ORAL | Status: DC
  Administered 2019-03-22 – 2019-03-24 (×3): 50 mg via ORAL
  Filled 2019-03-22 (×3): qty 1

## 2019-03-22 MED ORDER — HEPARIN SODIUM (PORCINE) 5000 UNIT/ML IJ SOLN
5000.0000 [IU] | Freq: Three times a day (TID) | INTRAMUSCULAR | Status: DC
  Administered 2019-03-22 – 2019-03-25 (×9): 5000 [IU] via SUBCUTANEOUS
  Filled 2019-03-22 (×9): qty 1

## 2019-03-22 MED ORDER — POTASSIUM CHLORIDE CRYS ER 20 MEQ PO TBCR
40.0000 meq | EXTENDED_RELEASE_TABLET | Freq: Once | ORAL | Status: AC
  Administered 2019-03-22: 40 meq via ORAL
  Filled 2019-03-22: qty 2

## 2019-03-22 MED ORDER — SENNOSIDES-DOCUSATE SODIUM 8.6-50 MG PO TABS: 1.0000 | ORAL_TABLET | Freq: Every evening | ORAL | Status: DC | PRN

## 2019-03-22 MED ORDER — LISINOPRIL 5 MG PO TABS
2.5000 mg | ORAL_TABLET | Freq: Every day | ORAL | Status: DC
  Administered 2019-03-22 – 2019-03-25 (×4): 2.5 mg via ORAL
  Filled 2019-03-22 (×4): qty 1

## 2019-03-22 MED ORDER — LAMOTRIGINE 100 MG PO TABS
100.0000 mg | ORAL_TABLET | Freq: Two times a day (BID) | ORAL | Status: DC
  Administered 2019-03-22 – 2019-03-25 (×7): 100 mg via ORAL
  Filled 2019-03-22 (×7): qty 1

## 2019-03-22 MED ORDER — CIPROFLOXACIN IN D5W 400 MG/200ML IV SOLN
400.0000 mg | Freq: Once | INTRAVENOUS | Status: AC
  Administered 2019-03-22: 400 mg via INTRAVENOUS
  Filled 2019-03-22: qty 200

## 2019-03-22 MED ORDER — SODIUM CHLORIDE 0.9 % IV SOLN
1.0000 g | INTRAVENOUS | Status: DC
  Administered 2019-03-22 – 2019-03-23 (×2): 1 g via INTRAVENOUS
  Filled 2019-03-22 (×2): qty 10

## 2019-03-22 NOTE — Progress Notes (Signed)
PROGRESS NOTE  [name] TDS:[id number] DOB: 17-Jan-1961 DOA: 03/22/2019 PCP: Kathyrn Drown, MD  Brief History:  58 year old female with a history of chronic emboli, lung nodules, IBS, hypertension, diet-controlled diabetes mellitus, anxiety, hyperlipidemia presenting after being found in her bathtub unable to get up.  At the time of my evaluation, the patient is alert, conversant, and able to answer questions appropriately although she is a difficult historian secondary to her cognitive delay.  The patient's advocate and caregiver is at the bedside to supplement the history.  The patient is able to tell me that she slipped and fell in the bathroom on the morning of 03/20/2019.  Because of pain in her head, neck, back, and legs, she was not able to get up.  The patient states that she did not lose consciousness or " fall asleep".  The patient's caregiver was out of town on that day.  When he returned to town on 03/21/2019, he was not able to get in touch with the patient.  As result, EMS was activated.  Upon arrival, the patient was found in the bathtub with a CBG 38.  The patient was given D50.  As they were taking the patient into the ambulance, the patient was awake and able to verbalize to her caregiver, " meet me in the ER".  In the emergency department, the patient was initially obtunded and unable to provide any history.  However as time progressed, it appeared that the patient's mental status improved. The patient states that she had been in her usual state of health prior to the events discussed above.  She had denied any new changes in her medications.  She denied any fevers, chills, chest pain, shortness breath, cough, hemoptysis, hematochezia, melena.  She states that she has chronic abdominal cramping and pain as well as chronic back pain and leg pain.  She continues to smoke 1/2 pack/day x 40+ years.  She denies any alcohol or illicit drug use.  She stated that on 03/18/2019, she had  3 episodes of emesis, but has not had any emesis since then. In the emergency department, the patient was afebrile hemodynamically stable.  WBC was 7.9.  BMP showed a potassium 2.6.  AST 140, ALT 34, alk phosphatase 77, bilirubin 0.4.  CT of the abdomen and pelvis was negative for any acute findings.  CTA chest was negative for any traumatic injury or acute findings.  CT of the brain was negative.  CT of the cervical spine was negative for fracture or subluxation.  Urinalysis showed pyuria 21-50 WBC.  Troponins were 46>>>50.  CPK was 8109.  Assessment/Plan: Rhabdomyolysis -CPK 8109 at the time of presentation -Start IV fluids  Acute metabolic encephalopathy -In part due to hypoglycemia and possible UTI -The patient is not on any hypoglycemic agents at home nor insulin -03/22/2019 am--caregiver at the bedside states that her mental status is near baseline  Pyuria -start ceftriaxone pending culture  ?  Syncope/elevated troponin -Echocardiogram -Check orthostatic vital signs -Remain on telemetry -Personally reviewed EKG--sinus rhythm, nonspecific T wave changes, early repolarization  Hypoglycemia/diabetes mellitus type 2 -Likely due to no oral intake for nearly 48 hours -Check C-peptide -follow CBGs -02/12/2019 hemoglobin A1c 6.2  Transaminasemia -Elevated AST likely secondary to the patient's muscle breakdown -Trend LFTs  Lower extremity pain and edema -Venous duplex rule out DVT  Hypokalemia -Replete -Check magnesium  Mood disorder -Continue Cogentin, Lamictal, paliperidone, sertraline  Hyperlipidemia -Continue statin  Tobacco abuse I have discussed tobacco cessation with the patient.  I have counseled the patient regarding the negative impacts of continued tobacco use including but not limited to lung cancer, COPD, and cardiovascular disease.  I have discussed alternatives to tobacco and modalities that may help facilitate tobacco cessation including but not limited to  biofeedback, hypnosis, and medications.  Total time spent with tobacco counseling was 4 minutes.        Disposition Plan:   Home in 2-3 days  Family Communication:   caregiver at bedside updated 9/12  Consultants:  none  Code Status:  FULL  DVT Prophylaxis:  [place] Lovenox   Procedures: As Listed in Progress Note Above  Antibiotics: Ceftriaxone 9/12>>>    Total time spent 40 minutes.  Greater than 50% spent face to face counseling and coordinating care.    Subjective: Patient complains of pain in the back of her head, back pain, leg pain which is slightly better than yesterday.  She denies any nausea, vomiting, diarrhea, chest pain, chest pain, cough, hemoptysis, dysuria.  Objective: Vitals:   03/22/19 0500 03/22/19 0530 03/22/19 0600 03/22/19 0630  BP: 122/75 117/71 119/74 123/75  Pulse: 91 88 84 87  Resp: _0 Temp:      SpO2: 97% 97% 98% 95%  Weight:      Height:       No intake or output data in the 24 hours ending 03/22/19 0745 Weight change:  Exam:   General:  Pt is alert, follows commands appropriately, not in acute distress  HEENT: No icterus, No thrush, No neck mass, [place]/AT  Cardiovascular: RRR, S1/S2, no rubs, no gallops  Respiratory: CTA bilaterally, no wheezing, no crackles, no rhonchi  Abdomen: Soft/+BS, non tender, non distended, no guarding  Extremities: 1 + LE edema, No lymphangitis, No petechiae, No rashes, no synovitis   Data Reviewed: I have personally reviewed following labs and imaging studies Basic Metabolic Panel: Recent Labs  Lab 03/22/19 0130  NA 135  K 2.6*  CL 95*  CO2 28  GLUCOSE 149*  BUN 14  CREATININE 0.83  CALCIUM 8.5*   Liver Function Tests: Recent Labs  Lab 03/22/19 0130  AST 148*  ALT 34  ALKPHOS 77  BILITOT 0.4  PROT 7.2  ALBUMIN 3.7   No results for input(s): LIPASE, AMYLASE in the last 168 hours. No results for input(s): AMMONIA in the last 168 hours. Coagulation Profile: No results for  input(s): INR, PROTIME in the last 168 hours. CBC: Recent Labs  Lab 03/22/19 0130  WBC 7.9  NEUTROABS 5.9  HGB 12.2  HCT 37.4  MCV 94.7  PLT 196   Cardiac Enzymes: Recent Labs  Lab 03/22/19 0130  CKTOTAL 8,109*   BNP: Invalid input(s): POCBNP CBG: Recent Labs  Lab 03/22/19 0029 03/22/19 0300  GLUCAP 187* 109*   HbA1C: No results for input(s): HGBA1C in the last 72 hours. Urine analysis:    Component Value Date/Time   COLORURINE YELLOW 03/22/2019 0310   APPEARANCEUR HAZY (A) 03/22/2019 0310   LABSPEC 1.021 03/22/2019 0310   PHURINE 5.0 03/22/2019 0310   GLUCOSEU NEGATIVE 03/22/2019 0310   HGBUR MODERATE (A) 03/22/2019 0310   BILIRUBINUR NEGATIVE 03/22/2019 0310   KETONESUR NEGATIVE 03/22/2019 0310   PROTEINUR 30 (A) 03/22/2019 0310   UROBILINOGEN 0.2 01/27/2014 1556   NITRITE NEGATIVE 03/22/2019 0310   LEUKOCYTESUR NEGATIVE 03/22/2019 0310   Sepsis Labs: _1 (procalcitonin:4,lacticidven:4) ) Recent Results (from the past 240 hour(s))  SARS Coronavirus  2 [facility] order, Performed in [facility] hospital lab) Nasopharyngeal Nasopharyngeal Swab     Status: None   Collection Time: 03/22/19  6:02 AM   Specimen: Nasopharyngeal Swab  Result Value Ref Range Status   SARS Coronavirus 2 NEGATIVE NEGATIVE Final    Comment: (NOTE) If result is NEGATIVE SARS-CoV-2 target nucleic acids are NOT DETECTED. The SARS-CoV-2 RNA is generally detectable in upper and lower  respiratory specimens during the acute phase of infection. The lowest  concentration of SARS-CoV-2 viral copies this assay can detect is 250  copies / mL. A negative result does not preclude SARS-CoV-2 infection  and should not be used as the sole basis for treatment or other  patient management decisions.  A negative result may occur with  improper specimen collection / handling, submission of specimen other  than nasopharyngeal swab, presence of viral mutation(s) within the  areas targeted by this  assay, and inadequate number of viral copies  (<250 copies / mL). A negative result must be combined with clinical  observations, patient history, and epidemiological information. If result is POSITIVE SARS-CoV-2 target nucleic acids are DETECTED. The SARS-CoV-2 RNA is generally detectable in upper and lower  respiratory specimens dur ing the acute phase of infection.  Positive  results are indicative of active infection with SARS-CoV-2.  Clinical  correlation with patient history and other diagnostic information is  necessary to determine patient infection status.  Positive results do  not rule out bacterial infection or co-infection with other viruses. If result is PRESUMPTIVE POSTIVE SARS-CoV-2 nucleic acids MAY BE PRESENT.   A presumptive positive result was obtained on the submitted specimen  and confirmed on repeat testing.  While 2019 novel coronavirus  (SARS-CoV-2) nucleic acids may be present in the submitted sample  additional confirmatory testing may be necessary for epidemiological  and / or clinical management purposes  to differentiate between  SARS-CoV-2 and other Sarbecovirus currently known to infect humans.  If clinically indicated additional testing with an alternate test  methodology [id number]) is advised. The SARS-CoV-2 RNA is generally  detectable in upper and lower respiratory sp ecimens during the acute  phase of infection. The expected result is Negative. Fact Sheet for Patients:  StrictlyIdeas.no Fact Sheet for Healthcare Providers: BankingDealers.co.za This test is not yet approved or cleared by the [place] FDA and has been authorized for detection and/or diagnosis of SARS-CoV-2 by FDA under an Emergency Use Authorization (EUA).  This EUA will remain in effect (meaning this test can be used) for the duration of the COVID-19 declaration under Section 564(b)(1) of the Act, 21 U.S.C. section 360bbb-3(b)(1),  unless the authorization is terminated or revoked sooner. Performed at [facility], [address], [place], [place] [address]      Scheduled Meds: Continuous Infusions:  sodium chloride Stopped (03/22/19 0549)   ciprofloxacin      Procedures/Studies: Ct Head Wo Contrast  Result Date: 03/22/2019 CLINICAL DATA:  Fall in the bathtub yesterday, loss of consciousness with head injury. Headache and neck pain. EXAM: CT HEAD WITHOUT CONTRAST CT CERVICAL SPINE WITHOUT CONTRAST TECHNIQUE: Multidetector CT imaging of the head and cervical spine was performed following the standard protocol without intravenous contrast. Multiplanar CT image reconstructions of the cervical spine were also generated. COMPARISON:  Head CT 01/27/2014 FINDINGS: CT HEAD FINDINGS Brain: No intracranial hemorrhage, mass effect, or midline shift. No hydrocephalus. The basilar cisterns are patent. No evidence of territorial infarct or acute ischemia. No extra-axial or intracranial fluid collection. Vascular: No hyperdense  vessel or unexpected calcification. Skull: No fracture or focal lesion. Sinuses/Orbits: Paranasal sinuses and mastoid air cells are clear. The visualized orbits are unremarkable. Other: None. CT CERVICAL SPINE FINDINGS Alignment: Straightening of normal lordosis. No traumatic subluxation. Grade 1 anterolisthesis of C2 on C3 appears degenerative. Skull base and vertebrae: No acute fracture. Vertebral body heights are maintained. The dens and skull base are intact. Soft tissues and spinal canal: No prevertebral fluid or swelling. No visible canal hematoma. Disc levels: Diffuse disc space narrowing and endplate spurring. Multilevel facet hypertrophy. Degenerative changes colic is spinal canal stenosis the level of C4-C5, C5-C6, and C6-C7. Upper chest: Assessed on concurrent chest CT. Other: None. IMPRESSION: 1. Unremarkable noncontrast head CT. 2. Multilevel degenerative change throughout the cervical spine without acute  fracture or subluxation. Electronically Signed   By: Keith Rake M.D.   On: 03/22/2019 03:53   Ct Chest Wo Contrast  Result Date: 03/22/2019 CLINICAL DATA:  Fall in the bathtub yesterday. Upper back and left chest pain. EXAM: CT CHEST WITHOUT CONTRAST CT ABDOMEN, AND PELVIS WITH CONTRAST TECHNIQUE: Multidetector CT imaging of the chest was performed following stone protocol without IV contrast. Multidetector CT imaging of the abdomen and pelvis was performed following the standard protocol during bolus administration of intravenous contrast. CONTRAST:  147m OMNIPAQUE IOHEXOL 300 MG/ML  SOLN COMPARISON:  Chest CT 10/04/2017 FINDINGS: CT CHEST FINDINGS Cardiovascular: Limited assessment for vascular injury given lack of IV contrast. Aortic atherosclerosis. No periaortic stranding. Heart is normal in size. No pericardial effusion. Mediastinum/Nodes: No mediastinal hemorrhage or hematoma. No pneumomediastinum. The esophagus is decompressed. No visualized thyroid nodule. Lungs/Pleura: No pneumothorax. No pulmonary contusion. No pleural fluid. Paramediastinal right middle lobe pulmonary nodule measures 18 x 12 x 20 mm (volume = 2.300 cm^3), previously 16 x 10 x 21 mm (volume = 1.8 cm^3). No new pulmonary nodule. Trachea and mainstem bronchi are patent. Musculoskeletal: No acute fracture of the ribs, sternum, thoracic spine, included clavicles or shoulder girdles. No confluent body wall contusion. CT ABDOMEN PELVIS FINDINGS Hepatobiliary: No hepatic injury or perihepatic hematoma. Gallbladder is unremarkable. Pancreas: No evidence of injury. No ductal dilatation or inflammation. Spleen: No splenic injury or perisplenic hematoma. Adrenals/Urinary Tract: No adrenal hemorrhage or renal injury identified. Bladder is decompressed but otherwise unremarkable. Stomach/Bowel: Evidence of bowel injury. No bowel wall thickening or inflammation. No free air or free fluid. Normal appendix incidentally noted.  Vascular/Lymphatic: No vascular injury. Abdominal aorta and IVC are intact. No retroperitoneal fluid. No adenopathy. Reproductive: Status post hysterectomy. No adnexal masses. Other: No confluent body wall contusion, portions of the right lateral abdominal wall partially excluded from the field of view. No free air or free fluid. Tiny fat containing umbilical hernia. Musculoskeletal: There are no acute or suspicious osseous abnormalities. No fracture of the lumbar spine or pelvis. IMPRESSION: 1. No evidence of acute traumatic injury to the chest, abdomen, or pelvis. 2. Known right middle lobe pulmonary nodule slightly larger than in 2019 exam. Recommend continued clinical radiologic follow-up. Aortic Atherosclerosis (ICD10-I70.0). Electronically Signed   By: MKeith RakeM.D.   On: 03/22/2019 04:07   Ct Cervical Spine Wo Contrast  Result Date: 03/22/2019 CLINICAL DATA:  Fall in the bathtub yesterday, loss of consciousness with head injury. Headache and neck pain. EXAM: CT HEAD WITHOUT CONTRAST CT CERVICAL SPINE WITHOUT CONTRAST TECHNIQUE: Multidetector CT imaging of the head and cervical spine was performed following the standard protocol without intravenous contrast. Multiplanar CT image reconstructions of the cervical spine were also generated.  COMPARISON:  Head CT 01/27/2014 FINDINGS: CT HEAD FINDINGS Brain: No intracranial hemorrhage, mass effect, or midline shift. No hydrocephalus. The basilar cisterns are patent. No evidence of territorial infarct or acute ischemia. No extra-axial or intracranial fluid collection. Vascular: No hyperdense vessel or unexpected calcification. Skull: No fracture or focal lesion. Sinuses/Orbits: Paranasal sinuses and mastoid air cells are clear. The visualized orbits are unremarkable. Other: None. CT CERVICAL SPINE FINDINGS Alignment: Straightening of normal lordosis. No traumatic subluxation. Grade 1 anterolisthesis of C2 on C3 appears degenerative. Skull base and  vertebrae: No acute fracture. Vertebral body heights are maintained. The dens and skull base are intact. Soft tissues and spinal canal: No prevertebral fluid or swelling. No visible canal hematoma. Disc levels: Diffuse disc space narrowing and endplate spurring. Multilevel facet hypertrophy. Degenerative changes colic is spinal canal stenosis the level of C4-C5, C5-C6, and C6-C7. Upper chest: Assessed on concurrent chest CT. Other: None. IMPRESSION: 1. Unremarkable noncontrast head CT. 2. Multilevel degenerative change throughout the cervical spine without acute fracture or subluxation. Electronically Signed   By: Keith Rake M.D.   On: 03/22/2019 03:53   Ct Abdomen Pelvis W Contrast  Result Date: 03/22/2019 CLINICAL DATA:  Fall in the bathtub yesterday. Upper back and left chest pain. EXAM: CT CHEST WITHOUT CONTRAST CT ABDOMEN, AND PELVIS WITH CONTRAST TECHNIQUE: Multidetector CT imaging of the chest was performed following stone protocol without IV contrast. Multidetector CT imaging of the abdomen and pelvis was performed following the standard protocol during bolus administration of intravenous contrast. CONTRAST:  121m OMNIPAQUE IOHEXOL 300 MG/ML  SOLN COMPARISON:  Chest CT 10/04/2017 FINDINGS: CT CHEST FINDINGS Cardiovascular: Limited assessment for vascular injury given lack of IV contrast. Aortic atherosclerosis. No periaortic stranding. Heart is normal in size. No pericardial effusion. Mediastinum/Nodes: No mediastinal hemorrhage or hematoma. No pneumomediastinum. The esophagus is decompressed. No visualized thyroid nodule. Lungs/Pleura: No pneumothorax. No pulmonary contusion. No pleural fluid. Paramediastinal right middle lobe pulmonary nodule measures 18 x 12 x 20 mm (volume = 2.300 cm^3), previously 16 x 10 x 21 mm (volume = 1.8 cm^3). No new pulmonary nodule. Trachea and mainstem bronchi are patent. Musculoskeletal: No acute fracture of the ribs, sternum, thoracic spine, included clavicles or  shoulder girdles. No confluent body wall contusion. CT ABDOMEN PELVIS FINDINGS Hepatobiliary: No hepatic injury or perihepatic hematoma. Gallbladder is unremarkable. Pancreas: No evidence of injury. No ductal dilatation or inflammation. Spleen: No splenic injury or perisplenic hematoma. Adrenals/Urinary Tract: No adrenal hemorrhage or renal injury identified. Bladder is decompressed but otherwise unremarkable. Stomach/Bowel: Evidence of bowel injury. No bowel wall thickening or inflammation. No free air or free fluid. Normal appendix incidentally noted. Vascular/Lymphatic: No vascular injury. Abdominal aorta and IVC are intact. No retroperitoneal fluid. No adenopathy. Reproductive: Status post hysterectomy. No adnexal masses. Other: No confluent body wall contusion, portions of the right lateral abdominal wall partially excluded from the field of view. No free air or free fluid. Tiny fat containing umbilical hernia. Musculoskeletal: There are no acute or suspicious osseous abnormalities. No fracture of the lumbar spine or pelvis. IMPRESSION: 1. No evidence of acute traumatic injury to the chest, abdomen, or pelvis. 2. Known right middle lobe pulmonary nodule slightly larger than in 2019 exam. Recommend continued clinical radiologic follow-up. Aortic Atherosclerosis (ICD10-I70.0). Electronically Signed   By: MKeith RakeM.D.   On: 03/22/2019 04:07    DOrson Eva DO  [facility] Pager [phone] If 7PM-7AM, please contact night-coverage [email or web address] Password [email or web address]9/06/2019, 7:45 AM   LOS:  0 days

## 2019-03-22 NOTE — ED Provider Notes (Signed)
[facility] EMERGENCY DEPARTMENT Provider Note   CSN: [id number] Arrival date & time: 03/22/19  0020   Time seen 2:35 AM  History   Chief Complaint Chief Complaint  Patient presents with  . Fall    HPI [name] is a 58 y.o. female.     HPI patient is here with a friend.  He states the last time he spoke to her was Thursday morning, the 10th.  He then went out of town and started calling her in the morning on the 11th without a response all day.  Finally in the evening around 11:30 PM he called 911 because he could not enter her house because it was locked.  Patient was found in the bathtub.  She states she had fallen and was unable to get up.  He states she normally takes bath around 930 to 10:30 in the morning and suspects she may have fallen shortly after he spoke to her on the 10th.  She complains of pain in her upper back, her chest, her neck, both of her hips, and she has chronic pain in her knees.  She told her friend that she had hit her head on the faucet.  She is supposed to have knee replacement surgery starting next spring.  Patient initially seemed confused and hard to wake up however that improved during the course of conversation.  EMS reports her initial blood sugar was 38 and she received 100 cc of D10.  PCP Kathyrn Drown, MD  Past Medical History:  Diagnosis Date  . Anxiety   . Diabetes mellitus   . Diabetes mellitus, type II ([place])   . HTN (hypertension)   . Hyperglycemia   . IBS (irritable bowel syndrome)   . Lung nodules    right, followed by PCP, PET 11/2011  . Mental retardation   . MI (myocardial infarction) ([place])   . Migraines   . Sleep apnea     Patient Active Problem List   Diagnosis Date Noted  . Primary osteoarthritis of both knees 01/27/2019  . Hyperlipidemia 09/26/2017  . Dysphagia 08/15/2016  . Obstructive sleep apnea syndrome 06/27/2016  . Rectal bleeding 04/07/2013  . Melena 03/04/2013  . Hematemesis 03/04/2013  . Abdominal pain,  epigastric 03/04/2013  . Hypokalemia 03/04/2013  . Smoker 01/16/2012  . Diarrhea 12/26/2011  . Pulmonary nodule, right 12/26/2011  . Abdominal pain 10/08/2011  . Fatty liver 08/24/2011  . Constipation 10/20/2010  . NAUSEA AND VOMITING 06/09/2010  . HEMATOCHEZIA 04/21/2010  . OTHER DYSPHAGIA 04/21/2010  . MILD MENTAL RETARDATION 02/17/2009  . GLAUCOMA 02/17/2009  . Diabetes type 2, controlled ([place]) 01/15/2009  . Morbid obesity ([place]) 01/15/2009  . ANXIETY 01/15/2009  . UNSPECIFIED MENTAL RETARDATION 01/15/2009  . MIGRAINE HEADACHE 01/15/2009  . Essential hypertension 01/15/2009  . HEMORRHOIDS 01/15/2009  . GASTROESOPHAGEAL REFLUX DISEASE, CHRONIC 01/15/2009  . CONSTIPATION, CHRONIC 01/15/2009  . IBS (irritable bowel syndrome) 01/15/2009  . KNEE PAIN, CHRONIC 01/15/2009  . BACK PAIN, CHRONIC 01/15/2009  . Hx of adenomatous colonic polyps 01/15/2009    Past Surgical History:  Procedure Laterality Date  . ABDOMINAL HYSTERECTOMY    . COLONOSCOPY  11/2007   hyperplastic polyps, prior hx of adenomas   . COLONOSCOPY  05/2010   incomplete due to poor prep, hyperplastic rectal polyp  . COLONOSCOPY  05/05/2002   Dimunitive polyps in the rectum and left colon, cold    biopsied/removed.  Scattered few left-sided diverticula.  Regular colonic   mucosa appeared normal  .  COLONOSCOPY N/A 05/28/2013   Rourk: mulitple tubular adenomas removed. next tcs 05/2016  . COLONOSCOPY WITH PROPOFOL N/A 09/07/2016   Dr. Gala Romney: For hyperplastic polyps removed. Next colonoscopy March 2023 given history of adenomatous colon polyps in the past.  . ESOPHAGOGASTRODUODENOSCOPY  08/2007   moderate sized hiatal hernia  . ESOPHAGOGASTRODUODENOSCOPY  05/2010   noncritical schatzki ring s/p 31F  . ESOPHAGOGASTRODUODENOSCOPY (EGD) WITH ESOPHAGEAL DILATION N/A 02/06/2013   ATF:TDDUKG esophagus-s/p dilation up to a 77 [place] size with [facility] dilators.  Hiatal hernia  . ESOPHAGOGASTRODUODENOSCOPY (EGD) WITH PROPOFOL  N/A 09/07/2016   Dr. Gala Romney: Normal, status post empiric dilation of the esophagus for history of dysphagia  . EXTERNAL EAR SURGERY     bilateral  . FOOT SURGERY    . GLAUCOMA SURGERY    . MALONEY DILATION N/A 09/07/2016   Procedure: Venia Minks DILATION;  Surgeon: Daneil Dolin, MD;  Location: [facility] ENDO SUITE;  Service: Endoscopy;  Laterality: N/A;  . POLYPECTOMY  09/07/2016   Procedure: POLYPECTOMY;  Surgeon: Daneil Dolin, MD;  Location: [facility] ENDO SUITE;  Service: Endoscopy;;  sigmoid colon x4  . small bowel capsule  10/2007   normal     OB History   No obstetric history on file.      Home Medications    Prior to Admission medications   Medication Sig Start Date End Date Taking? Authorizing Provider  atorvastatin (LIPITOR) 10 MG tablet TAKE 1 TABLET BY MOUTH ONCE A DAY. 02/12/19   Kathyrn Drown, MD  benztropine (COGENTIN) 1 MG tablet TAKE 1 TABLET BY MOUTH ONCE A DAY. 03/18/19   Cloria Spring, MD  calcium-vitamin D (OSCAL WITH D) 500-200 MG-UNIT per tablet Take 1 tablet by mouth 3 (three) times daily.      [provider]  dicyclomine (BENTYL) 10 MG capsule TAKE 1 CAPSULE BY MOUTH FOUR TIMES DAILY BEFORE MEALS AND AT BEDTIME FOR LOOSE STOOLS.HOLD FOR CONSTIPATION 11/12/18   Mahala Menghini, PA-C  fluticasone (FLONASE) 50 MCG/ACT nasal spray Place 2 sprays into both nostrils daily for 7 days. Patient not taking: Reported on 11/19/2018 06/20/18 11/19/18  Margette Fast, MD  hydrocortisone (ANUSOL-HC) 2.5 % rectal cream Place 1 application rectally 2 (two) times daily. 02/26/19   Annitta Needs, NP  lamoTRIgine (LAMICTAL) 100 MG tablet TAKE 1 TABLET BY MOUTH TWICE A DAY. 03/18/19   Cloria Spring, MD  lisinopril (ZESTRIL) 2.5 MG tablet TAKE (1) TABLET BY MOUTH EACH MORNING. 01/06/19   Kathyrn Drown, MD  paliperidone (INVEGA) 9 MG 24 hr tablet TAKE 1 TABLET BY MOUTH ONCE A DAY. 03/18/19   Cloria Spring, MD  potassium chloride (K-DUR) 10 MEQ tablet Take 1 tablet (10 mEq total) by mouth 2 (two)  times daily. 01/06/19   Kathyrn Drown, MD  propranolol (INDERAL) 10 MG tablet Take 1 tablet (10 mg total) by mouth 3 (three) times daily. 01/06/19   Kathyrn Drown, MD  RABEprazole (ACIPHEX) 20 MG tablet TAKE 1 TABLET BY MOUTH TWICE A DAY. 01/14/19   Mahala Menghini, PA-C  sertraline (ZOLOFT) 100 MG tablet TAKE 1 TABLET BY MOUTH ONCE A DAY. 03/18/19   Cloria Spring, MD  torsemide (DEMADEX) 20 MG tablet TAKE (1) TABLET BY MOUTH EACH MORNING. 01/06/19   Kathyrn Drown, MD  traZODone (DESYREL) 100 MG tablet TAKE 2 TABLETS(200MG ) BY MOUTH AT BEDTIME. 03/18/19   Cloria Spring, MD    Family History Family History  Problem Relation  Age of Onset  . Stroke Mother   . Heart attack Father   . Schizophrenia Other   . Drug abuse Other   . Alcohol abuse Other   . Colon cancer Other        aunt  . Obesity Other   . COPD Other   . GER disease Other   . Diabetes type II Other   . Anxiety disorder Other   . Depression Other   . Depression Sister   . Schizophrenia Sister   . Liver disease Neg Hx   . Inflammatory bowel disease Neg Hx     Social History Social History   Tobacco Use  . Smoking status: Current Every Day Smoker    Packs/day: 0.25    Years: 40.00    Pack years: 10.00    Types: Cigarettes  . Smokeless tobacco: Never Used  Substance Use Topics  . Alcohol use: No  . Drug use: No  lives at home Lives alone   Allergies   Thorazine [chlorpromazine hcl], Acetaminophen, Aspirin, Aspirin-acetaminophen-caffeine, Nsaids, Penicillins, and Tomato   Review of Systems Review of Systems  All other systems reviewed and are negative.    Physical Exam Updated Vital Signs BP 132/79   Pulse 89   Temp 98.7 F (37.1 C)   Resp 15   Ht 5' 10.5" (1.791 m)   Wt (!) 147 kg   SpO2 93%   BMI 45.83 kg/m   Physical Exam Vitals signs and nursing note reviewed.  Constitutional:      General: She is not in acute distress.    Appearance: Normal appearance. She is well-developed. She is  not ill-appearing or toxic-appearing.  HENT:     Head: Normocephalic and atraumatic.     Right Ear: External ear normal.     Left Ear: External ear normal.     Nose: Nose normal. No mucosal edema or rhinorrhea.     Mouth/Throat:     Mouth: Mucous membranes are dry.     Dentition: No dental abscesses.     Pharynx: Oropharynx is clear. No uvula swelling.  Eyes:     Extraocular Movements: Extraocular movements intact.     Conjunctiva/sclera: Conjunctivae normal.     Pupils: Pupils are equal, round, and reactive to light.  Neck:     Musculoskeletal: Full passive range of motion without pain and normal range of motion. Muscular tenderness present.     Comments: Mild diffuse tenderness Cardiovascular:     Rate and Rhythm: Normal rate and regular rhythm.     Heart sounds: Normal heart sounds. No murmur. No friction rub. No gallop.   Pulmonary:     Effort: Pulmonary effort is normal. No respiratory distress.     Breath sounds: Normal breath sounds. No wheezing, rhonchi or rales.  Chest:     Chest wall: Tenderness present. No crepitus.    Abdominal:     General: Bowel sounds are normal. There is no distension.     Palpations: Abdomen is soft.     Tenderness: There is no abdominal tenderness. There is no guarding or rebound.  Musculoskeletal: Normal range of motion.        General: Tenderness present.     Comments: Patient has trouble flexing her knees and her hips.  There is no obvious new trauma to her knees, there is no joint effusions or bruising seen.  She points to both hips is being painful although she seems to point more to the left one.  When I examine her back her back pain is above her upper lumbar spine.  Mainly in the thoracic spine.  Skin:    General: Skin is warm and dry.     Coloration: Skin is not pale.     Findings: No erythema or rash.  Neurological:     General: No focal deficit present.     Mental Status: She is alert and oriented to person, place, and time.      Cranial Nerves: No cranial nerve deficit.  Psychiatric:        Mood and Affect: Mood normal. Mood is not anxious.        Speech: Speech normal.        Behavior: Behavior normal.        Thought Content: Thought content normal.      ED Treatments / Results  Labs (all labs ordered are listed, but only abnormal results are displayed) Results for orders placed or performed during the hospital encounter of 03/22/19  Urinalysis, Routine w reflex microscopic  Result Value Ref Range   Color, Urine YELLOW YELLOW   APPearance HAZY (A) CLEAR   Specific Gravity, Urine 1.021 1.005 - 1.030   pH 5.0 5.0 - 8.0   Glucose, UA NEGATIVE NEGATIVE mg/dL   Hgb urine dipstick MODERATE (A) NEGATIVE   Bilirubin Urine NEGATIVE NEGATIVE   Ketones, ur NEGATIVE NEGATIVE mg/dL   Protein, ur 30 (A) NEGATIVE mg/dL   Nitrite NEGATIVE NEGATIVE   Leukocytes,Ua NEGATIVE NEGATIVE   RBC / HPF >50 (H) 0 - 5 RBC/hpf   WBC, UA 21-50 0 - 5 WBC/hpf   Bacteria, UA RARE (A) NONE SEEN   Squamous Epithelial / LPF 11-20 0 - 5   Mucus PRESENT   Comprehensive metabolic panel  Result Value Ref Range   Sodium 135 135 - 145 mmol/L   Potassium 2.6 (LL) 3.5 - 5.1 mmol/L   Chloride 95 (L) 98 - 111 mmol/L   CO2 28 22 - 32 mmol/L   Glucose, Bld 149 (H) 70 - 99 mg/dL   BUN 14 6 - 20 mg/dL   Creatinine, Ser 0.83 0.44 - 1.00 mg/dL   Calcium 8.5 (L) 8.9 - 10.3 mg/dL   Total Protein 7.2 6.5 - 8.1 g/dL   Albumin 3.7 3.5 - 5.0 g/dL   AST 148 (H) 15 - 41 U/L   ALT 34 0 - 44 U/L   Alkaline Phosphatase 77 38 - 126 U/L   Total Bilirubin 0.4 0.3 - 1.2 mg/dL   GFR calc non Af Amer >60 >60 mL/min   GFR calc Af Amer >60 >60 mL/min   Anion gap 12 5 - 15  CBC with Differential  Result Value Ref Range   WBC 7.9 4.0 - 10.5 K/uL   RBC 3.95 3.87 - 5.11 MIL/uL   Hemoglobin 12.2 12.0 - 15.0 g/dL   HCT 37.4 36.0 - 46.0 %   MCV 94.7 80.0 - 100.0 fL   MCH 30.9 26.0 - 34.0 pg   MCHC 32.6 30.0 - 36.0 g/dL   RDW 12.9 11.5 - 15.5 %   Platelets  196 150 - 400 K/uL   nRBC 0.0 0.0 - 0.2 %   Neutrophils Relative % 73 %   Neutro Abs 5.9 1.7 - 7.7 K/uL   Lymphocytes Relative 15 %   Lymphs Abs 1.2 0.7 - 4.0 K/uL   Monocytes Relative 10 %   Monocytes Absolute 0.8 0.1 - 1.0 K/uL   Eosinophils Relative 0 %  Eosinophils Absolute 0.0 0.0 - 0.5 K/uL   Basophils Relative 1 %   Basophils Absolute 0.0 0.0 - 0.1 K/uL   Immature Granulocytes 1 %   Abs Immature Granulocytes 0.04 0.00 - 0.07 K/uL  CK  Result Value Ref Range   Total CK 8,109 (H) 38 - 234 U/L  CBG monitoring, ED  Result Value Ref Range   Glucose-Capillary 187 (H) 70 - 99 mg/dL  CBG monitoring, ED  Result Value Ref Range   Glucose-Capillary 109 (H) 70 - 99 mg/dL  Troponin I (High Sensitivity)  Result Value Ref Range   Troponin I (High Sensitivity) 46 (H) <18 ng/L  Troponin I (High Sensitivity)  Result Value Ref Range   Troponin I (High Sensitivity) 50 (H) <18 ng/L   Laboratory interpretation all normal except elevated CK consistent with rhabdomyolysis from laying in the tub for 36 hours, possible UTI but contaminated specimen.  Hypokalemia, mild hyperglycemia, initial troponin mildly elevated    EKG EKG Interpretation  Date/Time:  Saturday March 22 2019 00:29:14 EDT Ventricular Rate:  103 PR Interval:    QRS Duration: 80 QT Interval:  352 QTC Calculation: 461 R Axis:   55 Text Interpretation:  Sinus tachycardia Borderline repolarization abnormality Since last tracing rate faster 04 Sep 2016 Confirmed by Rolland Porter [phone]) on 03/22/2019 12:39:03 AM   Radiology Ct Head Wo Contrast Ct Cervical Spine Wo Contrast   Result Date: 03/22/2019 CLINICAL DATA:  Fall in the bathtub yesterday, loss of consciousness with head injury. Headache and neck pain. EXAM: CT HEAD WITHOUT CONTRAST CT CERVICAL SPINE WITHOUT CONTRAST TECHNIQUE: Multidetector CT imaging of the head and cervical spine was performed following the standard protocol without intravenous contrast.  Multiplanar CT image reconstructions of the cervical spine were also generated. COMPARISON:  Head CT 01/27/2014 FINDINGS: CT HEAD FINDINGS Brain: No intracranial hemorrhage, mass effect, or midline shift. No hydrocephalus. The basilar cisterns are patent. No evidence of territorial infarct or acute ischemia. No extra-axial or intracranial fluid collection. Vascular: No hyperdense vessel or unexpected calcification. Skull: No fracture or focal lesion. Sinuses/Orbits: Paranasal sinuses and mastoid air cells are clear. The visualized orbits are unremarkable. Other: None. CT CERVICAL SPINE FINDINGS Alignment: Straightening of normal lordosis. No traumatic subluxation. Grade 1 anterolisthesis of C2 on C3 appears degenerative. Skull base and vertebrae: No acute fracture. Vertebral body heights are maintained. The dens and skull base are intact. Soft tissues and spinal canal: No prevertebral fluid or swelling. No visible canal hematoma. Disc levels: Diffuse disc space narrowing and endplate spurring. Multilevel facet hypertrophy. Degenerative changes colic is spinal canal stenosis the level of C4-C5, C5-C6, and C6-C7. Upper chest: Assessed on concurrent chest CT. Other: None. IMPRESSION: 1. Unremarkable noncontrast head CT. 2. Multilevel degenerative change throughout the cervical spine without acute fracture or subluxation. Electronically Signed   By: Keith Rake M.D.   On: 03/22/2019 03:53   Ct Chest Wo Contrast Ct Abdomen Pelvis W Contrast  Result Date: 03/22/2019 CLINICAL DATA:  Fall in the bathtub yesterday. Upper back and left chest pain. EXAM: CT CHEST WITHOUT CONTRAST CT ABDOMEN, AND PELVIS WITH CONTRAST TECHNIQUE: Multidetector CT imaging of the chest was performed following stone protocol without IV contrast. Multidetector CT imaging of the abdomen and pelvis was performed following the standard protocol during bolus administration of intravenous contrast. CONTRAST:  140mL OMNIPAQUE IOHEXOL 300 MG/ML   SOLN COMPARISON:  Chest CT 10/04/2017 FINDINGS: CT CHEST FINDINGS Cardiovascular: Limited assessment for vascular injury given lack of IV contrast. Aortic  atherosclerosis. No periaortic stranding. Heart is normal in size. No pericardial effusion. Mediastinum/Nodes: No mediastinal hemorrhage or hematoma. No pneumomediastinum. The esophagus is decompressed. No visualized thyroid nodule. Lungs/Pleura: No pneumothorax. No pulmonary contusion. No pleural fluid. Paramediastinal right middle lobe pulmonary nodule measures 18 x 12 x 20 mm (volume = 2.300 cm^3), previously 16 x 10 x 21 mm (volume = 1.8 cm^3). No new pulmonary nodule. Trachea and mainstem bronchi are patent. Musculoskeletal: No acute fracture of the ribs, sternum, thoracic spine, included clavicles or shoulder girdles. No confluent body wall contusion. CT ABDOMEN PELVIS FINDINGS Hepatobiliary: No hepatic injury or perihepatic hematoma. Gallbladder is unremarkable. Pancreas: No evidence of injury. No ductal dilatation or inflammation. Spleen: No splenic injury or perisplenic hematoma. Adrenals/Urinary Tract: No adrenal hemorrhage or renal injury identified. Bladder is decompressed but otherwise unremarkable. Stomach/Bowel: Evidence of bowel injury. No bowel wall thickening or inflammation. No free air or free fluid. Normal appendix incidentally noted. Vascular/Lymphatic: No vascular injury. Abdominal aorta and IVC are intact. No retroperitoneal fluid. No adenopathy. Reproductive: Status post hysterectomy. No adnexal masses. Other: No confluent body wall contusion, portions of the right lateral abdominal wall partially excluded from the field of view. No free air or free fluid. Tiny fat containing umbilical hernia. Musculoskeletal: There are no acute or suspicious osseous abnormalities. No fracture of the lumbar spine or pelvis. IMPRESSION: 1. No evidence of acute traumatic injury to the chest, abdomen, or pelvis. 2. Known right middle lobe pulmonary nodule  slightly larger than in 2019 exam. Recommend continued clinical radiologic follow-up. Aortic Atherosclerosis (ICD10-I70.0). Electronically Signed   By: Keith Rake M.D.   On: 03/22/2019 04:07        Procedures .Critical Care Performed by: Rolland Porter, MD Authorized by: Rolland Porter, MD   Critical care provider statement:    Critical care time (minutes):  39   Critical care was necessary to treat or prevent imminent or life-threatening deterioration of the following conditions:  Metabolic crisis   Critical care was time spent personally by me on the following activities:  Discussions with consultants, examination of patient, obtaining history from patient or surrogate, ordering and review of laboratory studies, ordering and review of radiographic studies, pulse oximetry and re-evaluation of patient's condition   (including critical care time)  Medications Ordered in ED Medications  0.9 %  sodium chloride infusion ( Intravenous New Bag/Given 03/22/19 0407)  iohexol (OMNIPAQUE) 300 MG/ML solution 100 mL (100 mLs Intravenous Contrast Given 03/22/19 0325)  potassium chloride SA (K-DUR) CR tablet 40 mEq (40 mEq Oral Given 03/22/19 0408)  ciprofloxacin (CIPRO) IVPB 400 mg (400 mg Intravenous New Bag/Given 03/22/19 0408)     Initial Impression / Assessment and Plan / ED Course  I have reviewed the triage vital signs and the nursing notes.  Pertinent labs & imaging results that were available during my care of the patient were reviewed by me and considered in my medical decision making (see chart for details).      Scans and x-rays were ordered on the painful areas.  Laboratory testing was done including CK to look for rhabdomyolysis.  3:55 AM patient noted to have elevated CK, she was started on IV fluids.  She had a possible UTI, urine culture was sent and she was given IV antibiotics.  Patient also noted to have moderate hypokalemia.  EKG was done and she was started on oral potassium.   Patient was informed of her test results and need for admission.  She also was informed  of her radiology study results and she was relieved that she did not have any acute fractures.  Patient was discussed with Dr. Clearence Ped, hospitalist, she will admit.   Final Clinical Impressions(s) / ED Diagnoses   Final diagnoses:  Traumatic rhabdomyolysis, initial encounter [facility])  Urinary tract infection with hematuria, site unspecified  Hypokalemia  Fall in bathtub, initial encounter  Upper back pain  Neck pain    Plan admission  Rolland Porter, MD, Barbette Or, MD 03/22/19 [phone]

## 2019-03-22 NOTE — H&P (Signed)
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 does not have any white count or fever, cardiac failure cannot be ruled out and troponins will continue to be trended, toxins will be rule out tox panel. 2. Rhabdomyolysis 1. Secondary to being down for 36 hours 2. Hold statin 3. Continue to monitor 4. NS at 250 mL's per hour 3. Hypokalemia 1. Replace and recheck 4. Elevated troponin 1. Continue to trend 5. Hyperglycemia 1. Sliding scale and Accu-Cheks   DVT Prophylaxis-Heparin- SCDs   AM Labs Ordered, also please review Full Orders  Family Communication: Admission, patients condition and plan of care including tests being ordered have not been discussed with patient due to obtunded mental status. Code Status: Full  Admission status: Inpatient: Based on  patients clinical presentation and evaluation of above clinical data, I have made determination that patient meets Inpatient criteria at this time.  Time spent in minutes : 68   Rolla Plate M.D on 03/22/2019 at 5:21 AM

## 2019-03-22 NOTE — ED Triage Notes (Signed)
[facility] - pt was found in bathtub by EMS after neighbor called for a well-fair check. Pt had been in tub for 36 hours. Initially, CBG 38, pt received 100cc's of D10, CBG 209 now. Pt c/o back and leg pain.

## 2019-03-22 NOTE — ED Notes (Signed)
ED TO INPATIENT HANDOFF REPORT  ED Nurse Name and Phone #:  Apolonio Schneiders [phone]  Q Name/Age/Gender [name] 59 y.o. female Room/Bed: APA10/APA10  Code Status   Code Status: Not on file  Home/SNF/Other Home Patient oriented to: self. Place time Is this baseline? Yes   Triage Complete: Triage complete  Chief Complaint fall  Triage Note [facility] - pt was found in bathtub by EMS after neighbor called for a well-fair check. Pt had been in tub for 36 hours. Initially, CBG 38, pt received 100cc's of D10, CBG 209 now. Pt c/o back and leg pain.    Allergies Allergies  Allergen Reactions  . Thorazine [Chlorpromazine Hcl] Anaphylaxis  . Acetaminophen Other (See Comments)    Makes pt dizzy  . Aspirin Other (See Comments)    seizure  . Aspirin-Acetaminophen-Caffeine Other (See Comments)    seizure  . Nsaids Nausea And Vomiting  . Penicillins Nausea And Vomiting    Has patient had a PCN reaction causing immediate rash, facial/tongue/throat swelling, SOB or lightheadedness with hypotension:Yes Has patient had a PCN reaction causing severe rash involving mucus membranes or skin necrosis:Yes Has patient had a PCN reaction that required hospitalization:Yes Has patient had a PCN reaction occurring within the last 10 years:No If all of the above answers are "NO", then may proceed with Cephalosporin use.   . Tomato Rash    Level of Care/Admitting Diagnosis ED Disposition    ED Disposition Condition [facility] Area: [facility] [[id number]]  Level of Care: Telemetry [5]  Covid Evaluation: Confirmed COVID Negative  Diagnosis: Rhabdomyolysis [728.88.ICD-9-CM]  Admitting Physician: TAT, DAVID [[id number]]  Attending Physician: TAT, DAVID [[id number]]  Estimated length of stay: past midnight tomorrow  Certification:: I certify this patient will need inpatient services for at least 2 midnights  PT Class (Do Not Modify): Inpatient [101]  PT Acc Code (Do Not Modify): Private [1]        B Medical/Surgery History Past Medical History:  Diagnosis Date  . Anxiety   . Diabetes mellitus   . Diabetes mellitus, type II ([place])   . HTN (hypertension)   . Hyperglycemia   . IBS (irritable bowel syndrome)   . Lung nodules    right, followed by PCP, PET 11/2011  . Mental retardation   . MI (myocardial infarction) ([place])   . Migraines   . Sleep apnea    Past Surgical History:  Procedure Laterality Date  . ABDOMINAL HYSTERECTOMY    . COLONOSCOPY  11/2007   hyperplastic polyps, prior hx of adenomas   . COLONOSCOPY  05/2010   incomplete due to poor prep, hyperplastic rectal polyp  . COLONOSCOPY  05/05/2002   Dimunitive polyps in the rectum and left colon, cold    biopsied/removed.  Scattered few left-sided diverticula.  Regular colonic   mucosa appeared normal  . COLONOSCOPY N/A 05/28/2013   Rourk: mulitple tubular adenomas removed. next tcs 05/2016  . COLONOSCOPY WITH PROPOFOL N/A 09/07/2016   Dr. Gala Romney: For hyperplastic polyps removed. Next colonoscopy March 2023 given history of adenomatous colon polyps in the past.  . ESOPHAGOGASTRODUODENOSCOPY  08/2007   moderate sized hiatal hernia  . ESOPHAGOGASTRODUODENOSCOPY  05/2010   noncritical schatzki ring s/p 7F  . ESOPHAGOGASTRODUODENOSCOPY (EGD) WITH ESOPHAGEAL DILATION N/A 02/06/2013   GYB:WLSLHT esophagus-s/p dilation up to a 20 [place] size with [facility] dilators.  Hiatal hernia  . ESOPHAGOGASTRODUODENOSCOPY (EGD) WITH PROPOFOL N/A 09/07/2016   Dr. Gala Romney: Normal, status post empiric dilation of the  esophagus for history of dysphagia  . EXTERNAL EAR SURGERY     bilateral  . FOOT SURGERY    . GLAUCOMA SURGERY    . MALONEY DILATION N/A 09/07/2016   Procedure: Venia Minks DILATION;  Surgeon: Daneil Dolin, MD;  [facility];  Service: Endoscopy;  Laterality: N/A;  . POLYPECTOMY  09/07/2016   Procedure: POLYPECTOMY;  Surgeon: Daneil Dolin, MD;  Location: [facility];  Service: Endoscopy;;  sigmoid colon x4  . small  bowel capsule  10/2007   normal     A IV Location/Drains/Wounds Patient Lines/Drains/Airways Status   Active Line/Drains/Airways    Name:   Placement date:   Placement time:   Site:   Days:   Peripheral IV 03/22/19 Left Antecubital   03/22/19    0035    Antecubital   less than 1          Intake/Output Last 24 hours No intake or output data in the 24 hours ending 03/22/19 0829  Labs/Imaging Results for orders placed or performed during the hospital encounter of 03/22/19 (from the past 48 hour(s))  CBG monitoring, ED     Status: Abnormal   Collection Time: 03/22/19 12:29 AM  Result Value Ref Range   Glucose-Capillary 187 (H) 70 - 99 mg/dL  Comprehensive metabolic panel     Status: Abnormal   Collection Time: 03/22/19  1:30 AM  Result Value Ref Range   Sodium 135 135 - 145 mmol/L   Potassium 2.6 (LL) 3.5 - 5.1 mmol/L    Comment: CRITICAL RESULT CALLED TO, READ BACK BY AND VERIFIED WITH: NORMAN,B @ 0237 ON 03/22/19 BY JUW    Chloride 95 (L) 98 - 111 mmol/L   CO2 28 22 - 32 mmol/L   Glucose, Bld 149 (H) 70 - 99 mg/dL   BUN 14 6 - 20 mg/dL   Creatinine, Ser 0.83 0.44 - 1.00 mg/dL   Calcium 8.5 (L) 8.9 - 10.3 mg/dL   Total Protein 7.2 6.5 - 8.1 g/dL   Albumin 3.7 3.5 - 5.0 g/dL   AST 148 (H) 15 - 41 U/L   ALT 34 0 - 44 U/L   Alkaline Phosphatase 77 38 - 126 U/L   Total Bilirubin 0.4 0.3 - 1.2 mg/dL   GFR calc non Af Amer >60 >60 mL/min   GFR calc Af Amer >60 >60 mL/min   Anion gap 12 5 - 15    Comment: Performed at [facility], [address] [place], [place] [address]  CBC with Differential     Status: None   Collection Time: 03/22/19  1:30 AM  Result Value Ref Range   WBC 7.9 4.0 - 10.5 K/uL   RBC 3.95 3.87 - 5.11 MIL/uL   Hemoglobin 12.2 12.0 - 15.0 g/dL   HCT 37.4 36.0 - 46.0 %   MCV 94.7 80.0 - 100.0 fL   MCH 30.9 26.0 - 34.0 pg   MCHC 32.6 30.0 - 36.0 g/dL   RDW 12.9 11.5 - 15.5 %   Platelets 196 150 - 400 K/uL   nRBC 0.0 0.0 - 0.2 %   Neutrophils Relative %  73 %   Neutro Abs 5.9 1.7 - 7.7 K/uL   Lymphocytes Relative 15 %   Lymphs Abs 1.2 0.7 - 4.0 K/uL   Monocytes Relative 10 %   Monocytes Absolute 0.8 0.1 - 1.0 K/uL   Eosinophils Relative 0 %   Eosinophils Absolute 0.0 0.0 - 0.5 K/uL   Basophils  Relative 1 %   Basophils Absolute 0.0 0.0 - 0.1 K/uL   Immature Granulocytes 1 %   Abs Immature Granulocytes 0.04 0.00 - 0.07 K/uL    Comment: Performed at [facility], [address] [place], [place] [address]  CK     Status: Abnormal   Collection Time: 03/22/19  1:30 AM  Result Value Ref Range   Total CK 8,109 (H) 38 - 234 U/L    Comment: RESULTS CONFIRMED BY MANUAL DILUTION Performed at [facility], [address] [place], [place] [address]   Troponin I (High Sensitivity)     Status: Abnormal   Collection Time: 03/22/19  1:30 AM  Result Value Ref Range   Troponin I (High Sensitivity) 46 (H) <18 ng/L    Comment: (NOTE) Elevated high sensitivity troponin I (hsTnI) values and significant  changes across serial measurements may suggest ACS but many other  chronic and acute conditions are known to elevate hsTnI results.  Refer to the Links section for chest pain algorithms and additional  guidance. Performed at [facility], [address], [place], [place] [address]   CBG monitoring, ED     Status: Abnormal   Collection Time: 03/22/19  3:00 AM  Result Value Ref Range   Glucose-Capillary 109 (H) 70 - 99 mg/dL  Urinalysis, Routine w reflex microscopic     Status: Abnormal   Collection Time: 03/22/19  3:10 AM  Result Value Ref Range   Color, Urine YELLOW YELLOW   APPearance HAZY (A) CLEAR   Specific Gravity, Urine 1.021 1.005 - 1.030   pH 5.0 5.0 - 8.0   Glucose, UA NEGATIVE NEGATIVE mg/dL   Hgb urine dipstick MODERATE (A) NEGATIVE   Bilirubin Urine NEGATIVE NEGATIVE   Ketones, ur NEGATIVE NEGATIVE mg/dL   Protein, ur 30 (A) NEGATIVE mg/dL   Nitrite NEGATIVE NEGATIVE   Leukocytes,Ua NEGATIVE NEGATIVE   RBC / HPF >50 (H) 0 - 5  RBC/hpf   WBC, UA 21-50 0 - 5 WBC/hpf   Bacteria, UA RARE (A) NONE SEEN   Squamous Epithelial / LPF 11-20 0 - 5   Mucus PRESENT     Comment: Performed at [facility], [address], [place], [place] [address]  Troponin I (High Sensitivity)     Status: Abnormal   Collection Time: 03/22/19  3:13 AM  Result Value Ref Range   Troponin I (High Sensitivity) 50 (H) <18 ng/L    Comment: (NOTE) Elevated high sensitivity troponin I (hsTnI) values and significant  changes across serial measurements may suggest ACS but many other  chronic and acute conditions are known to elevate hsTnI results.  Refer to the "Links" section for chest pain algorithms and additional  guidance. Performed at [facility], [address] [place], [place] [address]   SARS Coronavirus 2 [facility] order, Performed in [facility] hospital lab) Nasopharyngeal Nasopharyngeal Swab     Status: None   Collection Time: 03/22/19  6:02 AM   Specimen: Nasopharyngeal Swab  Result Value Ref Range   SARS Coronavirus 2 NEGATIVE NEGATIVE    Comment: (NOTE) If result is NEGATIVE SARS-CoV-2 target nucleic acids are NOT DETECTED. The SARS-CoV-2 RNA is generally detectable in upper and lower  respiratory specimens during the acute phase of infection. The lowest  concentration of SARS-CoV-2 viral copies this assay can detect is 250  copies / mL. A negative result does not preclude SARS-CoV-2 infection  and should not be used as the sole basis for treatment or other  patient  management decisions.  A negative result may occur with  improper specimen collection / handling, submission of specimen other  than nasopharyngeal swab, presence of viral mutation(s) within the  areas targeted by this assay, and inadequate number of viral copies  (<250 copies / mL). A negative result must be combined with clinical  observations, patient history, and epidemiological information. If result is POSITIVE SARS-CoV-2 target nucleic acids are  DETECTED. The SARS-CoV-2 RNA is generally detectable in upper and lower  respiratory specimens dur ing the acute phase of infection.  Positive  results are indicative of active infection with SARS-CoV-2.  Clinical  correlation with patient history and other diagnostic information is  necessary to determine patient infection status.  Positive results do  not rule out bacterial infection or co-infection with other viruses. If result is PRESUMPTIVE POSTIVE SARS-CoV-2 nucleic acids MAY BE PRESENT.   A presumptive positive result was obtained on the submitted specimen  and confirmed on repeat testing.  While 2019 novel coronavirus  (SARS-CoV-2) nucleic acids may be present in the submitted sample  additional confirmatory testing may be necessary for epidemiological  and / or clinical management purposes  to differentiate between  SARS-CoV-2 and other Sarbecovirus currently known to infect humans.  If clinically indicated additional testing with an alternate test  methodology [phone]) is advised. The SARS-CoV-2 RNA is generally  detectable in upper and lower respiratory sp ecimens during the acute  phase of infection. The expected result is Negative. Fact Sheet for Patients:  StrictlyIdeas.no Fact Sheet for Healthcare Providers: BankingDealers.co.za This test is not yet approved or cleared by the [place] FDA and has been authorized for detection and/or diagnosis of SARS-CoV-2 by FDA under an Emergency Use Authorization (EUA).  This EUA will remain in effect (meaning this test can be used) for the duration of the COVID-19 declaration under Section 564(b)(1) of the Act, 21 U.S.C. section 360bbb-3(b)(1), unless the authorization is terminated or revoked sooner. Performed at [facility], [address] [place], [place] [address]   Vitamin B12     Status: Abnormal   Collection Time: 03/22/19  6:14 AM  Result Value Ref Range   Vitamin  B-12 133 (L) 180 - 914 pg/mL    Comment: (NOTE) This assay is not validated for testing neonatal or myeloproliferative syndrome specimens for Vitamin B12 levels. Performed at [facility], [address], [place], [place] [address]   Troponin I (High Sensitivity)     Status: Abnormal   Collection Time: 03/22/19  6:14 AM  Result Value Ref Range   Troponin I (High Sensitivity) 51 (H) <18 ng/L    Comment: (NOTE) Elevated high sensitivity troponin I (hsTnI) values and significant  changes across serial measurements may suggest ACS but many other  chronic and acute conditions are known to elevate hsTnI results.  Refer to the "Links" section for chest pain algorithms and additional  guidance. Performed at [facility], [address], [place], [place] [address]   Ethanol     Status: None   Collection Time: 03/22/19  7:46 AM  Result Value Ref Range   Alcohol, Ethyl (B) <10 <10 mg/dL    Comment: (NOTE) Lowest detectable limit for serum alcohol is 10 mg/dL. For medical purposes only. Performed at [facility], [address], [place],  [address]   Acetaminophen level     Status: Abnormal   Collection Time: 03/22/19  7:46 AM  Result Value Ref Range   Acetaminophen (Tylenol), Serum <10 (L) 10 - 30 ug/mL  Comment: (NOTE) Therapeutic concentrations vary significantly. A range of 10-30 ug/mL  may be an effective concentration for many patients. However, some  are best treated at concentrations outside of this range. Acetaminophen concentrations >150 ug/mL at 4 hours after ingestion  and >50 ug/mL at 12 hours after ingestion are often associated with  toxic reactions. Performed at [facility], [address] [place], [place] [address]   Salicylate level     Status: None   Collection Time: 03/22/19  7:46 AM  Result Value Ref Range   Salicylate Lvl <4.0 2.8 - 30.0 mg/dL    Comment: Performed at [facility], [address] [place], [place] [address]   Ct Head Wo Contrast  Result  Date: 03/22/2019 CLINICAL DATA:  Fall in the bathtub yesterday, loss of consciousness with head injury. Headache and neck pain. EXAM: CT HEAD WITHOUT CONTRAST CT CERVICAL SPINE WITHOUT CONTRAST TECHNIQUE: Multidetector CT imaging of the head and cervical spine was performed following the standard protocol without intravenous contrast. Multiplanar CT image reconstructions of the cervical spine were also generated. COMPARISON:  Head CT 01/27/2014 FINDINGS: CT HEAD FINDINGS Brain: No intracranial hemorrhage, mass effect, or midline shift. No hydrocephalus. The basilar cisterns are patent. No evidence of territorial infarct or acute ischemia. No extra-axial or intracranial fluid collection. Vascular: No hyperdense vessel or unexpected calcification. Skull: No fracture or focal lesion. Sinuses/Orbits: Paranasal sinuses and mastoid air cells are clear. The visualized orbits are unremarkable. Other: None. CT CERVICAL SPINE FINDINGS Alignment: Straightening of normal lordosis. No traumatic subluxation. Grade 1 anterolisthesis of C2 on C3 appears degenerative. Skull base and vertebrae: No acute fracture. Vertebral body heights are maintained. The dens and skull base are intact. Soft tissues and spinal canal: No prevertebral fluid or swelling. No visible canal hematoma. Disc levels: Diffuse disc space narrowing and endplate spurring. Multilevel facet hypertrophy. Degenerative changes colic is spinal canal stenosis the level of C4-C5, C5-C6, and C6-C7. Upper chest: Assessed on concurrent chest CT. Other: None. IMPRESSION: 1. Unremarkable noncontrast head CT. 2. Multilevel degenerative change throughout the cervical spine without acute fracture or subluxation. Electronically Signed   By: Keith Rake M.D.   On: 03/22/2019 03:53   Ct Chest Wo Contrast  Result Date: 03/22/2019 CLINICAL DATA:  Fall in the bathtub yesterday. Upper back and left chest pain. EXAM: CT CHEST WITHOUT CONTRAST CT ABDOMEN, AND PELVIS WITH CONTRAST  TECHNIQUE: Multidetector CT imaging of the chest was performed following stone protocol without IV contrast. Multidetector CT imaging of the abdomen and pelvis was performed following the standard protocol during bolus administration of intravenous contrast. CONTRAST:  135mL OMNIPAQUE IOHEXOL 300 MG/ML  SOLN COMPARISON:  Chest CT 10/04/2017 FINDINGS: CT CHEST FINDINGS Cardiovascular: Limited assessment for vascular injury given lack of IV contrast. Aortic atherosclerosis. No periaortic stranding. Heart is normal in size. No pericardial effusion. Mediastinum/Nodes: No mediastinal hemorrhage or hematoma. No pneumomediastinum. The esophagus is decompressed. No visualized thyroid nodule. Lungs/Pleura: No pneumothorax. No pulmonary contusion. No pleural fluid. Paramediastinal right middle lobe pulmonary nodule measures 18 x 12 x 20 mm (volume = 2.300 cm^3), previously 16 x 10 x 21 mm (volume = 1.8 cm^3). No new pulmonary nodule. Trachea and mainstem bronchi are patent. Musculoskeletal: No acute fracture of the ribs, sternum, thoracic spine, included clavicles or shoulder girdles. No confluent body wall contusion. CT ABDOMEN PELVIS FINDINGS Hepatobiliary: No hepatic injury or perihepatic hematoma. Gallbladder is unremarkable. Pancreas: No evidence of injury. No ductal dilatation or inflammation. Spleen: No splenic injury or perisplenic  hematoma. Adrenals/Urinary Tract: No adrenal hemorrhage or renal injury identified. Bladder is decompressed but otherwise unremarkable. Stomach/Bowel: Evidence of bowel injury. No bowel wall thickening or inflammation. No free air or free fluid. Normal appendix incidentally noted. Vascular/Lymphatic: No vascular injury. Abdominal aorta and IVC are intact. No retroperitoneal fluid. No adenopathy. Reproductive: Status post hysterectomy. No adnexal masses. Other: No confluent body wall contusion, portions of the right lateral abdominal wall partially excluded from the field of view. No free  air or free fluid. Tiny fat containing umbilical hernia. Musculoskeletal: There are no acute or suspicious osseous abnormalities. No fracture of the lumbar spine or pelvis. IMPRESSION: 1. No evidence of acute traumatic injury to the chest, abdomen, or pelvis. 2. Known right middle lobe pulmonary nodule slightly larger than in 2019 exam. Recommend continued clinical radiologic follow-up. Aortic Atherosclerosis (ICD10-I70.0). Electronically Signed   By: Keith Rake M.D.   On: 03/22/2019 04:07   Ct Cervical Spine Wo Contrast  Result Date: 03/22/2019 CLINICAL DATA:  Fall in the bathtub yesterday, loss of consciousness with head injury. Headache and neck pain. EXAM: CT HEAD WITHOUT CONTRAST CT CERVICAL SPINE WITHOUT CONTRAST TECHNIQUE: Multidetector CT imaging of the head and cervical spine was performed following the standard protocol without intravenous contrast. Multiplanar CT image reconstructions of the cervical spine were also generated. COMPARISON:  Head CT 01/27/2014 FINDINGS: CT HEAD FINDINGS Brain: No intracranial hemorrhage, mass effect, or midline shift. No hydrocephalus. The basilar cisterns are patent. No evidence of territorial infarct or acute ischemia. No extra-axial or intracranial fluid collection. Vascular: No hyperdense vessel or unexpected calcification. Skull: No fracture or focal lesion. Sinuses/Orbits: Paranasal sinuses and mastoid air cells are clear. The visualized orbits are unremarkable. Other: None. CT CERVICAL SPINE FINDINGS Alignment: Straightening of normal lordosis. No traumatic subluxation. Grade 1 anterolisthesis of C2 on C3 appears degenerative. Skull base and vertebrae: No acute fracture. Vertebral body heights are maintained. The dens and skull base are intact. Soft tissues and spinal canal: No prevertebral fluid or swelling. No visible canal hematoma. Disc levels: Diffuse disc space narrowing and endplate spurring. Multilevel facet hypertrophy. Degenerative changes colic  is spinal canal stenosis the level of C4-C5, C5-C6, and C6-C7. Upper chest: Assessed on concurrent chest CT. Other: None. IMPRESSION: 1. Unremarkable noncontrast head CT. 2. Multilevel degenerative change throughout the cervical spine without acute fracture or subluxation. Electronically Signed   By: Keith Rake M.D.   On: 03/22/2019 03:53   Ct Abdomen Pelvis W Contrast  Result Date: 03/22/2019 CLINICAL DATA:  Fall in the bathtub yesterday. Upper back and left chest pain. EXAM: CT CHEST WITHOUT CONTRAST CT ABDOMEN, AND PELVIS WITH CONTRAST TECHNIQUE: Multidetector CT imaging of the chest was performed following stone protocol without IV contrast. Multidetector CT imaging of the abdomen and pelvis was performed following the standard protocol during bolus administration of intravenous contrast. CONTRAST:  13mL OMNIPAQUE IOHEXOL 300 MG/ML  SOLN COMPARISON:  Chest CT 10/04/2017 FINDINGS: CT CHEST FINDINGS Cardiovascular: Limited assessment for vascular injury given lack of IV contrast. Aortic atherosclerosis. No periaortic stranding. Heart is normal in size. No pericardial effusion. Mediastinum/Nodes: No mediastinal hemorrhage or hematoma. No pneumomediastinum. The esophagus is decompressed. No visualized thyroid nodule. Lungs/Pleura: No pneumothorax. No pulmonary contusion. No pleural fluid. Paramediastinal right middle lobe pulmonary nodule measures 18 x 12 x 20 mm (volume = 2.300 cm^3), previously 16 x 10 x 21 mm (volume = 1.8 cm^3). No new pulmonary nodule. Trachea and mainstem bronchi are patent. Musculoskeletal: No acute fracture of the ribs,  sternum, thoracic spine, included clavicles or shoulder girdles. No confluent body wall contusion. CT ABDOMEN PELVIS FINDINGS Hepatobiliary: No hepatic injury or perihepatic hematoma. Gallbladder is unremarkable. Pancreas: No evidence of injury. No ductal dilatation or inflammation. Spleen: No splenic injury or perisplenic hematoma. Adrenals/Urinary Tract: No  adrenal hemorrhage or renal injury identified. Bladder is decompressed but otherwise unremarkable. Stomach/Bowel: Evidence of bowel injury. No bowel wall thickening or inflammation. No free air or free fluid. Normal appendix incidentally noted. Vascular/Lymphatic: No vascular injury. Abdominal aorta and IVC are intact. No retroperitoneal fluid. No adenopathy. Reproductive: Status post hysterectomy. No adnexal masses. Other: No confluent body wall contusion, portions of the right lateral abdominal wall partially excluded from the field of view. No free air or free fluid. Tiny fat containing umbilical hernia. Musculoskeletal: There are no acute or suspicious osseous abnormalities. No fracture of the lumbar spine or pelvis. IMPRESSION: 1. No evidence of acute traumatic injury to the chest, abdomen, or pelvis. 2. Known right middle lobe pulmonary nodule slightly larger than in 2019 exam. Recommend continued clinical radiologic follow-up. Aortic Atherosclerosis (ICD10-I70.0). Electronically Signed   By: Keith Rake M.D.   On: 03/22/2019 04:07    Pending Labs Unresulted Labs (From admission, onward)    Start     Ordered   03/22/19 1500  CK  Once,   STAT     03/22/19 0618   03/22/19 0617  Urine rapid drug screen (hosp performed)  Once,   STAT     03/22/19 0618   03/22/19 0558  Folate RBC  Once,   STAT     03/22/19 0557   03/22/19 0356  Urine culture  ONCE - STAT,   STAT    Question:  Patient immune status  Answer:  Normal   03/22/19 0356   Signed and Held  HIV antibody (Routine Testing)  Once,   R     Signed and Held   Signed and Held  CBC  (heparin)  Once,   R    Comments: Baseline for heparin therapy IF NOT ALREADY DRAWN.  Notify MD if PLT < 100 K.    Signed and Held   Signed and Held  Creatinine, serum  (heparin)  Once,   R    Comments: Baseline for heparin therapy IF NOT ALREADY DRAWN.    Signed and Held   Signed and Held  Magnesium  Add-on,   R     Signed and Held   Signed and Held   Phosphorus  Once,   R     Signed and Held   Signed and Held  TSH  Once,   R     Signed and Held   Signed and Held  Comprehensive metabolic panel  Tomorrow morning,   R     Signed and Held   Visual merchandiser and Held  CK  Tomorrow morning,   R     Signed and Held   Visual merchandiser and Held  C-peptide  Once,   R     Signed and Held   Signed and Held  Magnesium  Once,   R     Signed and Held   Signed and Held  Folate  Once,   R     Signed and Held          Vitals/Pain Today's Vitals   03/22/19 0500 03/22/19 0530 03/22/19 0600 03/22/19 0630  BP: 122/75 117/71 119/74 123/75  Pulse: 91 88 84 87  Resp: 12 10 12 12   Temp:  SpO2: 97% 97% 98% 95%  Weight:      Height:      PainSc:        Isolation Precautions No active isolations  Medications Medications  0.9 %  sodium chloride infusion ( Intravenous Stopped 03/22/19 0549)  ciprofloxacin (CIPRO) IVPB 400 mg (has no administration in time range)  iohexol (OMNIPAQUE) 300 MG/ML solution 100 mL (100 mLs Intravenous Contrast Given 03/22/19 0325)  potassium chloride SA (K-DUR) CR tablet 40 mEq (40 mEq Oral Given 03/22/19 0408)  ciprofloxacin (CIPRO) IVPB 400 mg (0 mg Intravenous Stopped 03/22/19 6825)    Mobility walks with person assist High fall risk   Focused Assessments    R Recommendations: See Admitting Provider Note  Report given to:   Additional Notes: report given to [employer]

## 2019-03-23 ENCOUNTER — Inpatient Hospital Stay (HOSPITAL_COMMUNITY): Payer: Medicare Other

## 2019-03-23 DIAGNOSIS — R55 Syncope and collapse: Secondary | ICD-10-CM

## 2019-03-23 DIAGNOSIS — F17200 Nicotine dependence, unspecified, uncomplicated: Secondary | ICD-10-CM

## 2019-03-23 DIAGNOSIS — I10 Essential (primary) hypertension: Secondary | ICD-10-CM

## 2019-03-23 LAB — URINE CULTURE: Special Requests: NORMAL

## 2019-03-23 LAB — CK: Total CK: 3630 U/L — ABNORMAL HIGH (ref 38–234)

## 2019-03-23 LAB — COMPREHENSIVE METABOLIC PANEL
ALT: 34 U/L (ref 0–44)
AST: 110 U/L — ABNORMAL HIGH (ref 15–41)
Albumin: 3.4 g/dL — ABNORMAL LOW (ref 3.5–5.0)
Alkaline Phosphatase: 66 U/L (ref 38–126)
Anion gap: 8 (ref 5–15)
BUN: 8 mg/dL (ref 6–20)
CO2: 28 mmol/L (ref 22–32)
Calcium: 8.6 mg/dL — ABNORMAL LOW (ref 8.9–10.3)
Chloride: 102 mmol/L (ref 98–111)
Creatinine, Ser: 0.58 mg/dL (ref 0.44–1.00)
GFR calc Af Amer: >60 mL/min (ref >60)
GFR calc non Af Amer: >60 mL/min (ref >60)
Glucose, Bld: 90 mg/dL (ref 70–99)
Potassium: 3.4 mmol/L — ABNORMAL LOW (ref 3.5–5.1)
Sodium: 138 mmol/L (ref 135–145)
Total Bilirubin: 0.5 mg/dL (ref 0.3–1.2)
Total Protein: 6.7 g/dL (ref 6.5–8.1)

## 2019-03-23 LAB — C-PEPTIDE: C-Peptide: 3.6 ng/mL (ref 1.1–4.4)

## 2019-03-23 MED ORDER — MAGNESIUM SULFATE 2 GM/50ML IV SOLN
2.0000 g | Freq: Once | INTRAVENOUS | Status: AC
  Administered 2019-03-23: 2 g via INTRAVENOUS
  Filled 2019-03-23: qty 50

## 2019-03-23 MED ORDER — ONDANSETRON HCL 4 MG/2ML IJ SOLN: 4.0000 mg | Freq: Four times a day (QID) | INTRAMUSCULAR | Status: DC | PRN

## 2019-03-23 MED ORDER — POTASSIUM CHLORIDE IN NACL 20-0.9 MEQ/L-% IV SOLN
INTRAVENOUS | Status: DC
  Administered 2019-03-23 – 2019-03-25 (×4): via INTRAVENOUS

## 2019-03-23 MED ORDER — POTASSIUM CHLORIDE CRYS ER 20 MEQ PO TBCR
20.0000 meq | EXTENDED_RELEASE_TABLET | Freq: Once | ORAL | Status: AC
  Administered 2019-03-23: 20 meq via ORAL
  Filled 2019-03-23: qty 1

## 2019-03-23 MED ORDER — CYANOCOBALAMIN 1000 MCG/ML IJ SOLN
1000.0000 ug | Freq: Every day | INTRAMUSCULAR | Status: AC
  Administered 2019-03-23 – 2019-03-25 (×3): 1000 ug via INTRAMUSCULAR
  Filled 2019-03-23 (×3): qty 1

## 2019-03-23 NOTE — Evaluation (Signed)
Physical Therapy Evaluation Patient Details Name: [name] MRN: [id number] DOB: 08/16/1960 Today's Date: 03/23/2019   History of Present Illness  Patient is a 58 year old female admitted to ED 03/22/2019 after being found in bath tub by neighbor, unable to get up. Had been in bath tub for 36 hours. Admission diagnosis - rhabdomyolysis and acute metabolic encephalopathy. PMH: anxiety, DM Type 2, HTN, hyperglycemia, IBS, lung modules, mental retardation, myocardial infarction, migraines, sleep apnea, obesity, chronic emboli.    Clinical Impression  Pt admitted with above diagnosis. Patient somewhat unsteady on her feet when ambulating with IV pole. Patient does have a SPC and 4 wheeled walker at home to use. Patient exhibited decreased endurance for activity and mild difficulty with power up during sit-to-stand transfers. Patient required verbal and gestural cues to attend to obstacles in her path during ambulation. Pt currently with functional limitations due to the deficits listed below (see PT Problem List). Pt will benefit from skilled PT to increase their independence and safety with mobility to allow discharge to the venue listed below.       Follow Up Recommendations Outpatient PT;Supervision - Intermittent    Equipment Recommendations  None recommended by PT    Recommendations for Other Services       Precautions / Restrictions Precautions Precautions: Fall Restrictions Weight Bearing Restrictions: No      Mobility  Bed Mobility               General bed mobility comments: patient sitting at EOB when arrived and at end of session.  Transfers Overall transfer level: Needs assistance Equipment used: Ambulation equipment used Transfers: Sit to/from [facility] Sit to Stand: Min guard Stand pivot transfers: Min guard          Ambulation/Gait Ambulation/Gait assistance: Min guard;Min Web designer (Feet): 300 Feet Assistive device: IV  Pole Gait Pattern/deviations: Step-through pattern;Decreased step length - right;Decreased step length - left;Decreased stride length;Drifts right/left Gait velocity: decreased   General Gait Details: slow, somewhat labored cadence with veering path and cues to attend to obstacles; 2 minor losses of balance recovering independently; fatigue noted  Stairs            Wheelchair Mobility    Modified Rankin (Stroke Patients Only)       Balance Overall balance assessment: Needs assistance Sitting-balance support: No upper extremity supported;Feet supported Sitting balance-Leahy Scale: Good     Standing balance support: Single extremity supported;During functional activity Standing balance-Leahy Scale: Fair Standing balance comment: w/ IV pole                             Pertinent Vitals/Pain Pain Assessment: 0-10 Pain Score: 6  Pain Location: legs, knees and feet Pain Descriptors / Indicators: Stabbing Pain Intervention(s): Limited activity within patient's tolerance;Monitored during session    Home Living Family/patient expects to be discharged to:: Private residence Living Arrangements: Alone Available Help at Discharge: Friend(s);Available PRN/intermittently Type of Home: Apartment Home Access: Elevator     Home Layout: One level Home Equipment: Toilet riser;Walker - 4 wheels;Cane - quad;Cane - single point;Grab bars - tub/shower      Prior Function Level of Independence: Independent with assistive device(s);Needs assistance   Gait / Transfers Assistance Needed: tried to walk in home without 4 wheeled walker as directed by physician. Uses 4 wheeled walker for community distances.  ADL's / Homemaking Assistance Needed: Independent for ADLS, CNA 3 day/wk for assistance  with cook, clean, laundry and take to appointments        Hand Dominance   Dominant Hand: Left    Extremity/Trunk Assessment   Upper Extremity Assessment Upper Extremity  Assessment: Defer to OT evaluation    Lower Extremity Assessment Lower Extremity Assessment: Generalized weakness    Cervical / Trunk Assessment Cervical / Trunk Assessment: Normal  Communication   Communication: No difficulties  Cognition     Overall Cognitive Status: Within Functional Limits for tasks assessed                                        General Comments      Exercises     Assessment/Plan    PT Assessment Patient needs continued PT services  PT Problem List Decreased strength;Decreased mobility;Decreased safety awareness;Decreased activity tolerance;Decreased balance;Pain;Decreased cognition       PT Treatment Interventions DME instruction;Therapeutic activities;Gait training;Therapeutic exercise;Patient/family education;Balance training;Neuromuscular re-education    PT Goals (Current goals can be found in the Care Plan section)  Acute Rehab PT Goals Patient Stated Goal: Go home and not fall again. PT Goal Formulation: With patient Time For Goal Achievement: 04/06/19    Frequency Min 3X/week   Barriers to discharge        Co-evaluation               AM-PAC PT "6 Clicks" Mobility  Outcome Measure Help needed turning from your back to your side while in a flat bed without using bedrails?: A Little Help needed moving from lying on your back to sitting on the side of a flat bed without using bedrails?: A Little Help needed moving to and from a bed to a chair (including a wheelchair)?: A Little Help needed standing up from a chair using your arms (e.g., wheelchair or bedside chair)?: A Little Help needed to walk in hospital room?: A Little Help needed climbing 3-5 steps with a railing? : A Lot 6 Click Score: 17    End of Session Equipment Utilized During Treatment: Gait belt Activity Tolerance: Patient tolerated treatment well;Patient limited by fatigue Patient left: in chair;with call bell/phone within reach;with chair alarm  set Nurse Communication: Mobility status PT Visit Diagnosis: Unsteadiness on feet (R26.81);Other abnormalities of gait and mobility (R26.89);History of falling (Z91.81);Muscle weakness (generalized) (M62.81)    Time: 2841-3244 PT Time Calculation (min) (ACUTE ONLY): 30 min   Charges:   PT Evaluation $PT Eval Moderate Complexity: 1 Mod PT Treatments $Gait Training: 8-22 mins        Floria Raveling. Hartnett-Rands, MS, PT Per [place] [phone] 03/23/2019, 11:45 AM

## 2019-03-23 NOTE — Plan of Care (Signed)
  Problem: Acute Rehab PT Goals(only PT should resolve) Goal: Patient Will Transfer Sit To/From Stand Outcome: Progressing Flowsheets (Taken 03/23/2019 1149) Patient will transfer sit to/from stand: with supervision Goal: Pt Will Transfer Bed To Chair/Chair To Bed Outcome: Progressing Flowsheets (Taken 03/23/2019 1149) Pt will Transfer Bed to Chair/Chair to Bed: with supervision Goal: Pt Will Perform Standing Balance Or Pre-Gait Outcome: Progressing Flowsheets (Taken 03/23/2019 1149) Pt will perform standing balance or pre-gait:  with min guard assist  with unilateral UE support Goal: Pt Will Ambulate Outcome: Progressing Flowsheets (Taken 03/23/2019 1149) Pt will Ambulate:  > 125 feet  with supervision  with least restrictive assistive device Goal: Pt/caregiver will Perform Home Exercise Program Outcome: Progressing Flowsheets (Taken 03/23/2019 1149) Pt/caregiver will Perform Home Exercise Program:  For increased strengthening  For improved balance  With minimal assist   Pamala Hurry D. Hartnett-Rands, MS, PT Per [place] [phone] 03/23/2019

## 2019-03-23 NOTE — Progress Notes (Signed)
  Echocardiogram 2D Echocardiogram has been performed.  [name] M 03/23/2019, 3:18 PM

## 2019-03-23 NOTE — Progress Notes (Signed)
PROGRESS NOTE  HOLDEN DRAUGHON KAJ:[id number] DOB: 1960/07/17 DOA: 03/22/2019 PCP: Kathyrn Drown, MD  Brief History:  58 year old female with a history of cognitive delay, lung nodules, IBS, hypertension, diet-controlled diabetes mellitus, anxiety, hyperlipidemia presenting after being found in her bathtub unable to get up.  At the time of my evaluation, the patient is alert, conversant, and able to answer questions appropriately although she is a difficult historian secondary to her cognitive delay.  The patient's advocate and caregiver is at the bedside to supplement the history.  The patient is able to tell me that she slipped and fell in the bathroom on the morning of 03/20/2019.  Because of pain in her head, neck, back, and legs, she was not able to get up.  The patient states that she did not lose consciousness or " fall asleep".  The patient's caregiver was out of town on that day.  When he returned to town on 03/21/2019, he was not able to get in touch with the patient.  As result, EMS was activated.  Upon arrival, the patient was found in the bathtub with a CBG 38.  The patient was given D50.  As they were taking the patient into the ambulance, the patient was awake and able to verbalize to her caregiver, " meet me in the ER".  In the emergency department, the patient was initially obtunded and unable to provide any history.  However as time progressed, it appeared that the patient's mental status improved. The patient states that she had been in her usual state of health prior to the events discussed above.  She had denied any new changes in her medications.  She denied any fevers, chills, chest pain, shortness breath, cough, hemoptysis, hematochezia, melena.  She states that she has chronic abdominal cramping and pain as well as chronic back pain and leg pain.  She continues to smoke 1/2 pack/day x 40+ years.  She denies any alcohol or illicit drug use.  She stated that on 03/18/2019, she  had 3 episodes of emesis, but has not had any emesis since then. In the emergency department, the patient was afebrile hemodynamically stable.  WBC was 7.9.  BMP showed a potassium 2.6.  AST 140, ALT 34, alk phosphatase 77, bilirubin 0.4.  CT of the abdomen and pelvis was negative for any acute findings.  CTA chest was negative for any traumatic injury or acute findings.  CT of the brain was negative.  CT of the cervical spine was negative for fracture or subluxation.  Urinalysis showed pyuria 21-50 WBC.  Troponins were 46>>>50.  CPK was 8109.  Assessment/Plan: Rhabdomyolysis -CPK 8109 at the time of presentation -continue IV fluids Ck 8109>>>3630 -am CK -PT eval  Acute metabolic encephalopathy -In part due to hypoglycemia and medications -The patient is not on any hypoglycemic agents at home nor insulin -03/23/2019 am--caregiver at the bedside states that her mental status is near baseline -TSH 4.492 -Urine culture unrevealing  Pyuria -discontinue ceftriaxone -culture unrevealing  ?  Syncope/elevated troponin -Echocardiogram--results pending -Check orthostatic vital signs -Remain on telemetry--no concerning dysrhythmia -Personally reviewed EKG--sinus rhythm, nonspecific T wave changes, early repolarization  Hypoglycemia/diabetes mellitus type 2 -Likely due to no oral intake for nearly 48 hours -Check C-peptide--3.6 -follow CBGs -02/12/2019 hemoglobin A1c 6.2  Transaminasemia -Elevated AST likely secondary to the patient's muscle breakdown -Trend LFTs  Lower extremity pain and edema -Venous duplex--neg -B12--133-->supplement with IM B12 -folate 7.4  Hypokalemia -  Replete -Check magnesium--1.7-->give 2 grams  Mood disorder -Continue Cogentin, Lamictal, paliperidone, sertraline  Hyperlipidemia -holding statin due to elevated CK  Tobacco abuse I have discussed tobacco cessation with the patient.  I have counseled the patient regarding the negative impacts of  continued tobacco use including but not limited to lung cancer, COPD, and cardiovascular disease.  I have discussed alternatives to tobacco and modalities that may help facilitate tobacco cessation including but not limited to biofeedback, hypnosis, and medications.  Total time spent with tobacco counseling was 4 minutes.        Disposition Plan:   Home in 1-2 days  Family Communication:   caregiver at bedside updated 9/13  Consultants:  none  Code Status:  FULL  DVT Prophylaxis:  [place] Lovenox   Procedures: As Listed in Progress Note Above  Antibiotics: Ceftriaxone 9/12>>>9/13      Subjective: Pt complains of leg pain.  She denies f/c, cp, sob, n/v/d.  She had soft stool without hematochezia or melena.  No abd pain.    Objective: Vitals:   03/22/19 1331 03/22/19 2103 03/23/19 0524 03/23/19 1413  BP: 124/73 127/60 107/81 123/62  Pulse: 82 73 76 76  Resp: (!) '21 18 18 17  '$ Temp: 98.7 F (37.1 C) 98.3 F (36.8 C) 97.8 F (36.6 C) 98.5 F (36.9 C)  TempSrc: Oral Oral Oral Oral  SpO2: 100% 99% 94% 100%  Weight:      Height:        Intake/Output Summary (Last 24 hours) at 03/23/2019 1719 Last data filed at 03/23/2019 1508 Gross per 24 hour  Intake 150 ml  Output 1 ml  Net 149 ml   Weight change:  Exam:   General:  Pt is alert, follows commands appropriately, not in acute distress  HEENT: No icterus, No thrush, No neck mass, [place]/AT  Cardiovascular: RRR, S1/S2, no rubs, no gallops  Respiratory: fine bibasilar crackles  Abdomen: Soft/+BS, non tender, non distended, no guarding  Extremities: 1 +LE edema, No lymphangitis, No petechiae, No rashes, no synovitis   Data Reviewed: I have personally reviewed following labs and imaging studies Basic Metabolic Panel: Recent Labs  Lab 03/22/19 0130 03/22/19 1034 03/23/19 0649  NA 135  --  138  K 2.6*  --  3.4*  CL 95*  --  102  CO2 28  --  28  GLUCOSE 149*  --  90  BUN 14  --  8  CREATININE  0.83  --  0.58  CALCIUM 8.5*  --  8.6*  MG  --  1.7  --   PHOS  --  3.7  --    Liver Function Tests: Recent Labs  Lab 03/22/19 0130 03/23/19 0649  AST 148* 110*  ALT 34 34  ALKPHOS 77 66  BILITOT 0.4 0.5  PROT 7.2 6.7  ALBUMIN 3.7 3.4*   No results for input(s): LIPASE, AMYLASE in the last 168 hours. No results for input(s): AMMONIA in the last 168 hours. Coagulation Profile: No results for input(s): INR, PROTIME in the last 168 hours. CBC: Recent Labs  Lab 03/22/19 0130  WBC 7.9  NEUTROABS 5.9  HGB 12.2  HCT 37.4  MCV 94.7  PLT 196   Cardiac Enzymes: Recent Labs  Lab 03/22/19 0130 03/23/19 0649  CKTOTAL 8,109* 3,630*   BNP: Invalid input(s): POCBNP CBG: Recent Labs  Lab 03/22/19 0029 03/22/19 0300  GLUCAP 187* 109*   HbA1C: No results for input(s): HGBA1C in the last 72 hours. Urine analysis:  Component Value Date/Time   COLORURINE YELLOW 03/22/2019 0310   APPEARANCEUR HAZY (A) 03/22/2019 0310   LABSPEC 1.021 03/22/2019 0310   PHURINE 5.0 03/22/2019 0310   GLUCOSEU NEGATIVE 03/22/2019 0310   HGBUR MODERATE (A) 03/22/2019 0310   BILIRUBINUR NEGATIVE 03/22/2019 0310   KETONESUR NEGATIVE 03/22/2019 0310   PROTEINUR 30 (A) 03/22/2019 0310   UROBILINOGEN 0.2 01/27/2014 1556   NITRITE NEGATIVE 03/22/2019 0310   LEUKOCYTESUR NEGATIVE 03/22/2019 0310   Sepsis Labs: '@LABRCNTIP'$ (procalcitonin:4,lacticidven:4) ) Recent Results (from the past 240 hour(s))  Urine culture     Status: Abnormal   Collection Time: 03/22/19  3:10 AM   Specimen: Urine, Clean Catch  Result Value Ref Range Status   Specimen Description   Final    URINE, CLEAN CATCH Performed at [facility], [address] [place], [place] [address]    Special Requests   Final    Normal Performed at [facility], [address] [place], [place] [address]    [place], [place] (A)  Final   Report Status 03/23/2019 FINAL  Final  SARS Coronavirus 2  [facility] order, Performed in [facility] hospital lab) Nasopharyngeal Nasopharyngeal Swab     Status: None   Collection Time: 03/22/19  6:02 AM   Specimen: Nasopharyngeal Swab  Result Value Ref Range Status   SARS Coronavirus 2 NEGATIVE NEGATIVE Final    Comment: (NOTE) If result is NEGATIVE SARS-CoV-2 target nucleic acids are NOT DETECTED. The SARS-CoV-2 RNA is generally detectable in upper and lower  respiratory specimens during the acute phase of infection. The lowest  concentration of SARS-CoV-2 viral copies this assay can detect is 250  copies / mL. A negative result does not preclude SARS-CoV-2 infection  and should not be used as the sole basis for treatment or other  patient management decisions.  A negative result may occur with  improper specimen collection / handling, submission of specimen other  than nasopharyngeal swab, presence of viral mutation(s) within the  areas targeted by this assay, and inadequate number of viral copies  (<250 copies / mL). A negative result must be combined with clinical  observations, patient history, and epidemiological information. If result is POSITIVE SARS-CoV-2 target nucleic acids are DETECTED. The SARS-CoV-2 RNA is generally detectable in upper and lower  respiratory specimens dur ing the acute phase of infection.  Positive  results are indicative of active infection with SARS-CoV-2.  Clinical  correlation with patient history and other diagnostic information is  necessary to determine patient infection status.  Positive results do  not rule out bacterial infection or co-infection with other viruses. If result is PRESUMPTIVE POSTIVE SARS-CoV-2 nucleic acids MAY BE PRESENT.   A presumptive positive result was obtained on the submitted specimen  and confirmed on repeat testing.  While 2019 novel coronavirus  (SARS-CoV-2) nucleic acids may be present in the submitted sample  additional confirmatory testing may be necessary for  epidemiological  and / or clinical management purposes  to differentiate between  SARS-CoV-2 and other Sarbecovirus currently known to infect humans.  If clinically indicated additional testing with an alternate test  methodology [phone]) is advised. The SARS-CoV-2 RNA is generally  detectable in upper and lower respiratory sp ecimens during the acute  phase of infection. The expected result is Negative. Fact Sheet for Patients:  StrictlyIdeas.no Fact Sheet for Healthcare Providers: BankingDealers.co.za This test is not yet approved or cleared by the [place] FDA and has been authorized for detection and/or diagnosis of  SARS-CoV-2 by FDA under an Emergency Use Authorization (EUA).  This EUA will remain in effect (meaning this test can be used) for the duration of the COVID-19 declaration under Section 564(b)(1) of the Act, 21 U.S.C. section 360bbb-3(b)(1), unless the authorization is terminated or revoked sooner. Performed at [facility], [address], [place], [place] [address]      Scheduled Meds:  benztropine  1 mg Oral Daily   cyanocobalamin  1,000 mcg Intramuscular Daily   heparin  5,000 Units Subcutaneous Q8H   lamoTRIgine  100 mg Oral BID   lisinopril  2.5 mg Oral Daily   multivitamin with minerals  1 tablet Oral Daily   propranolol  10 mg Oral TID   sertraline  100 mg Oral Daily   traZODone  50 mg Oral QHS   Continuous Infusions:  0.9 % NaCl with KCl 20 mEq / L Stopped (03/23/19 1040)   cefTRIAXone (ROCEPHIN)  IV Stopped (03/23/19 1047)    Procedures/Studies: Ct Head Wo Contrast  Result Date: 03/22/2019 CLINICAL DATA:  Fall in the bathtub yesterday, loss of consciousness with head injury. Headache and neck pain. EXAM: CT HEAD WITHOUT CONTRAST CT CERVICAL SPINE WITHOUT CONTRAST TECHNIQUE: Multidetector CT imaging of the head and cervical spine was performed following the standard protocol without  intravenous contrast. Multiplanar CT image reconstructions of the cervical spine were also generated. COMPARISON:  Head CT 01/27/2014 FINDINGS: CT HEAD FINDINGS Brain: No intracranial hemorrhage, mass effect, or midline shift. No hydrocephalus. The basilar cisterns are patent. No evidence of territorial infarct or acute ischemia. No extra-axial or intracranial fluid collection. Vascular: No hyperdense vessel or unexpected calcification. Skull: No fracture or focal lesion. Sinuses/Orbits: Paranasal sinuses and mastoid air cells are clear. The visualized orbits are unremarkable. Other: None. CT CERVICAL SPINE FINDINGS Alignment: Straightening of normal lordosis. No traumatic subluxation. Grade 1 anterolisthesis of C2 on C3 appears degenerative. Skull base and vertebrae: No acute fracture. Vertebral body heights are maintained. The dens and skull base are intact. Soft tissues and spinal canal: No prevertebral fluid or swelling. No visible canal hematoma. Disc levels: Diffuse disc space narrowing and endplate spurring. Multilevel facet hypertrophy. Degenerative changes colic is spinal canal stenosis the level of C4-C5, C5-C6, and C6-C7. Upper chest: Assessed on concurrent chest CT. Other: None. IMPRESSION: 1. Unremarkable noncontrast head CT. 2. Multilevel degenerative change throughout the cervical spine without acute fracture or subluxation. Electronically Signed   By: Keith Rake M.D.   On: 03/22/2019 03:53   Ct Chest Wo Contrast  Result Date: 03/22/2019 CLINICAL DATA:  Fall in the bathtub yesterday. Upper back and left chest pain. EXAM: CT CHEST WITHOUT CONTRAST CT ABDOMEN, AND PELVIS WITH CONTRAST TECHNIQUE: Multidetector CT imaging of the chest was performed following stone protocol without IV contrast. Multidetector CT imaging of the abdomen and pelvis was performed following the standard protocol during bolus administration of intravenous contrast. CONTRAST:  136m OMNIPAQUE IOHEXOL 300 MG/ML  SOLN  COMPARISON:  Chest CT 10/04/2017 FINDINGS: CT CHEST FINDINGS Cardiovascular: Limited assessment for vascular injury given lack of IV contrast. Aortic atherosclerosis. No periaortic stranding. Heart is normal in size. No pericardial effusion. Mediastinum/Nodes: No mediastinal hemorrhage or hematoma. No pneumomediastinum. The esophagus is decompressed. No visualized thyroid nodule. Lungs/Pleura: No pneumothorax. No pulmonary contusion. No pleural fluid. Paramediastinal right middle lobe pulmonary nodule measures 18 x 12 x 20 mm (volume = 2.300 cm^3), previously 16 x 10 x 21 mm (volume = 1.8 cm^3). No new pulmonary nodule. Trachea and mainstem bronchi are  patent. Musculoskeletal: No acute fracture of the ribs, sternum, thoracic spine, included clavicles or shoulder girdles. No confluent body wall contusion. CT ABDOMEN PELVIS FINDINGS Hepatobiliary: No hepatic injury or perihepatic hematoma. Gallbladder is unremarkable. Pancreas: No evidence of injury. No ductal dilatation or inflammation. Spleen: No splenic injury or perisplenic hematoma. Adrenals/Urinary Tract: No adrenal hemorrhage or renal injury identified. Bladder is decompressed but otherwise unremarkable. Stomach/Bowel: Evidence of bowel injury. No bowel wall thickening or inflammation. No free air or free fluid. Normal appendix incidentally noted. Vascular/Lymphatic: No vascular injury. Abdominal aorta and IVC are intact. No retroperitoneal fluid. No adenopathy. Reproductive: Status post hysterectomy. No adnexal masses. Other: No confluent body wall contusion, portions of the right lateral abdominal wall partially excluded from the field of view. No free air or free fluid. Tiny fat containing umbilical hernia. Musculoskeletal: There are no acute or suspicious osseous abnormalities. No fracture of the lumbar spine or pelvis. IMPRESSION: 1. No evidence of acute traumatic injury to the chest, abdomen, or pelvis. 2. Known right middle lobe pulmonary nodule slightly  larger than in 2019 exam. Recommend continued clinical radiologic follow-up. Aortic Atherosclerosis (ICD10-I70.0). Electronically Signed   By: Keith Rake M.D.   On: 03/22/2019 04:07   Ct Cervical Spine Wo Contrast  Result Date: 03/22/2019 CLINICAL DATA:  Fall in the bathtub yesterday, loss of consciousness with head injury. Headache and neck pain. EXAM: CT HEAD WITHOUT CONTRAST CT CERVICAL SPINE WITHOUT CONTRAST TECHNIQUE: Multidetector CT imaging of the head and cervical spine was performed following the standard protocol without intravenous contrast. Multiplanar CT image reconstructions of the cervical spine were also generated. COMPARISON:  Head CT 01/27/2014 FINDINGS: CT HEAD FINDINGS Brain: No intracranial hemorrhage, mass effect, or midline shift. No hydrocephalus. The basilar cisterns are patent. No evidence of territorial infarct or acute ischemia. No extra-axial or intracranial fluid collection. Vascular: No hyperdense vessel or unexpected calcification. Skull: No fracture or focal lesion. Sinuses/Orbits: Paranasal sinuses and mastoid air cells are clear. The visualized orbits are unremarkable. Other: None. CT CERVICAL SPINE FINDINGS Alignment: Straightening of normal lordosis. No traumatic subluxation. Grade 1 anterolisthesis of C2 on C3 appears degenerative. Skull base and vertebrae: No acute fracture. Vertebral body heights are maintained. The dens and skull base are intact. Soft tissues and spinal canal: No prevertebral fluid or swelling. No visible canal hematoma. Disc levels: Diffuse disc space narrowing and endplate spurring. Multilevel facet hypertrophy. Degenerative changes colic is spinal canal stenosis the level of C4-C5, C5-C6, and C6-C7. Upper chest: Assessed on concurrent chest CT. Other: None. IMPRESSION: 1. Unremarkable noncontrast head CT. 2. Multilevel degenerative change throughout the cervical spine without acute fracture or subluxation. Electronically Signed   By: Keith Rake M.D.   On: 03/22/2019 03:53   Ct Abdomen Pelvis W Contrast  Result Date: 03/22/2019 CLINICAL DATA:  Fall in the bathtub yesterday. Upper back and left chest pain. EXAM: CT CHEST WITHOUT CONTRAST CT ABDOMEN, AND PELVIS WITH CONTRAST TECHNIQUE: Multidetector CT imaging of the chest was performed following stone protocol without IV contrast. Multidetector CT imaging of the abdomen and pelvis was performed following the standard protocol during bolus administration of intravenous contrast. CONTRAST:  112m OMNIPAQUE IOHEXOL 300 MG/ML  SOLN COMPARISON:  Chest CT 10/04/2017 FINDINGS: CT CHEST FINDINGS Cardiovascular: Limited assessment for vascular injury given lack of IV contrast. Aortic atherosclerosis. No periaortic stranding. Heart is normal in size. No pericardial effusion. Mediastinum/Nodes: No mediastinal hemorrhage or hematoma. No pneumomediastinum. The esophagus is decompressed. No visualized thyroid nodule. Lungs/Pleura: No pneumothorax.  No pulmonary contusion. No pleural fluid. Paramediastinal right middle lobe pulmonary nodule measures 18 x 12 x 20 mm (volume = 2.300 cm^3), previously 16 x 10 x 21 mm (volume = 1.8 cm^3). No new pulmonary nodule. Trachea and mainstem bronchi are patent. Musculoskeletal: No acute fracture of the ribs, sternum, thoracic spine, included clavicles or shoulder girdles. No confluent body wall contusion. CT ABDOMEN PELVIS FINDINGS Hepatobiliary: No hepatic injury or perihepatic hematoma. Gallbladder is unremarkable. Pancreas: No evidence of injury. No ductal dilatation or inflammation. Spleen: No splenic injury or perisplenic hematoma. Adrenals/Urinary Tract: No adrenal hemorrhage or renal injury identified. Bladder is decompressed but otherwise unremarkable. Stomach/Bowel: Evidence of bowel injury. No bowel wall thickening or inflammation. No free air or free fluid. Normal appendix incidentally noted. Vascular/Lymphatic: No vascular injury. Abdominal aorta and IVC are  intact. No retroperitoneal fluid. No adenopathy. Reproductive: Status post hysterectomy. No adnexal masses. Other: No confluent body wall contusion, portions of the right lateral abdominal wall partially excluded from the field of view. No free air or free fluid. Tiny fat containing umbilical hernia. Musculoskeletal: There are no acute or suspicious osseous abnormalities. No fracture of the lumbar spine or pelvis. IMPRESSION: 1. No evidence of acute traumatic injury to the chest, abdomen, or pelvis. 2. Known right middle lobe pulmonary nodule slightly larger than in 2019 exam. Recommend continued clinical radiologic follow-up. Aortic Atherosclerosis (ICD10-I70.0). Electronically Signed   By: Keith Rake M.D.   On: 03/22/2019 04:07   US Venous Img Lower Bilateral  Result Date: 03/22/2019 CLINICAL DATA:  Pain, left greater than right EXAM: BILATERAL LOWER EXTREMITY VENOUS DOPPLER ULTRASOUND TECHNIQUE: Gray-scale sonography with compression, as well as color and duplex ultrasound, were performed to evaluate the deep venous system from the level of the common femoral vein through the popliteal and proximal calf veins. COMPARISON:  02/05/2003 FINDINGS: Normal compressibility of the common femoral, superficial femoral, and popliteal veins, as well as the proximal calf veins. No filling defects to suggest DVT on grayscale or color Doppler imaging. Doppler waveforms show normal direction of venous flow, normal respiratory phasicity and response to augmentation. 3.6 x 3.4 x 1 cm fluid collection in the left medial posterior popliteal fossa. IMPRESSION: 1. No femoropopliteal and no calf DVT in the visualized calf veins. If clinical symptoms are inconsistent or if there are persistent or worsening symptoms, further imaging (possibly involving the iliac veins) may be warranted. 2. Small left Baker's cyst Electronically Signed   By: Lucrezia Europe M.D.   On: 03/22/2019 13:23    Orson Eva, DO  [facility] Pager  [phone]  If 7PM-7AM, please contact night-coverage [email or web address] Password [email or web address] 03/23/2019, 5:19 PM   LOS: 1 day

## 2019-03-24 ENCOUNTER — Ambulatory Visit: Payer: Self-pay

## 2019-03-24 ENCOUNTER — Other Ambulatory Visit: Payer: Self-pay

## 2019-03-24 LAB — MAGNESIUM: Magnesium: 2 mg/dL (ref 1.7–2.4)

## 2019-03-24 LAB — CK: Total CK: 2449 U/L — ABNORMAL HIGH (ref 38–234)

## 2019-03-24 LAB — FOLATE RBC
Folate, Hemolysate: 303 ng/mL
Folate, RBC: 896 ng/mL (ref >498)
Hematocrit: 33.8 % — ABNORMAL LOW (ref 34.0–46.6)

## 2019-03-24 LAB — COMPREHENSIVE METABOLIC PANEL
ALT: 59 U/L — ABNORMAL HIGH (ref 0–44)
AST: 111 U/L — ABNORMAL HIGH (ref 15–41)
Albumin: 3.3 g/dL — ABNORMAL LOW (ref 3.5–5.0)
Alkaline Phosphatase: 79 U/L (ref 38–126)
Anion gap: 9 (ref 5–15)
BUN: 5 mg/dL — ABNORMAL LOW (ref 6–20)
CO2: 27 mmol/L (ref 22–32)
Calcium: 8.7 mg/dL — ABNORMAL LOW (ref 8.9–10.3)
Chloride: 102 mmol/L (ref 98–111)
Creatinine, Ser: 0.51 mg/dL (ref 0.44–1.00)
GFR calc Af Amer: >60 mL/min (ref >60)
GFR calc non Af Amer: >60 mL/min (ref >60)
Glucose, Bld: 99 mg/dL (ref 70–99)
Potassium: 3.2 mmol/L — ABNORMAL LOW (ref 3.5–5.1)
Sodium: 138 mmol/L (ref 135–145)
Total Bilirubin: 0.4 mg/dL (ref 0.3–1.2)
Total Protein: 6.6 g/dL (ref 6.5–8.1)

## 2019-03-24 LAB — GLUCOSE, CAPILLARY: Glucose-Capillary: 98 mg/dL (ref 70–99)

## 2019-03-24 LAB — ECHOCARDIOGRAM COMPLETE
Height: 70.5 in
Weight: 5184 oz

## 2019-03-24 LAB — HIV ANTIBODY (ROUTINE TESTING W REFLEX): HIV Screen 4th Generation wRfx: NONREACTIVE

## 2019-03-24 MED ORDER — PANTOPRAZOLE SODIUM 40 MG PO TBEC
40.0000 mg | DELAYED_RELEASE_TABLET | Freq: Every day | ORAL | Status: DC
  Administered 2019-03-24 – 2019-03-25 (×2): 40 mg via ORAL
  Filled 2019-03-24 (×2): qty 1

## 2019-03-24 MED ORDER — POTASSIUM CHLORIDE CRYS ER 20 MEQ PO TBCR
40.0000 meq | EXTENDED_RELEASE_TABLET | Freq: Once | ORAL | Status: AC
  Administered 2019-03-24: 40 meq via ORAL
  Filled 2019-03-24: qty 2

## 2019-03-24 NOTE — Evaluation (Signed)
Occupational Therapy Evaluation Patient Details Name: [name] MRN: [id number] DOB: September 10, 1960 Today's Date: 03/24/2019    History of Present Illness Patient is a 58 year old female admitted to ED 03/22/2019 after being found in bath tub by neighbor, unable to get up. Had been in bath tub for 36 hours. Admission diagnosis - rhabdomyolysis and acute metabolic encephalopathy. PMH: anxiety, DM Type 2, HTN, hyperglycemia, IBS, lung modules, mental retardation, myocardial infarction, migraines, sleep apnea, obesity, chronic emboli.   Clinical Impression   Patient is agreeable to participate in OT evaluation. Although patient lives alone, she requires assistance with ADLs which she relies on her CNA to assist with. Her CNA provides assistance only 3 days a week. Patient demonstrates difficulty with lower body dressing and safety while taking shower due to faulty DME. Patient reports that she needs a new shower chair. Discussed use of non-slip strips on the bottom on the tub to prevent further falling in addition to waiting to take her showers when her CNA is present for safety. Mentioned use of reacher and sock aid to increase her independence with Lower body dressing on the days when her CNA is not assisting her. Patient is interested in all suggestions. Will keep patient on OT caseload to work on lower body dressing with use of AE in order to increase her safety at home when she is alone and allow her to complete her dressing tasks at modified independent level.     Follow Up Recommendations  No OT follow up    Equipment Recommendations  Tub/shower seat;Other (comment)(reacher, sock aid, and non-skid strips in the tub)       Precautions / Restrictions Precautions Precautions: Fall Precaution Comments: history of falls Restrictions Weight Bearing Restrictions: No      Mobility Bed Mobility   General bed mobility comments: patient sitting at EOB when arrived.  Transfers Overall  transfer level: Needs assistance Equipment used: Ambulation equipment used;Rolling walker (2 wheeled) Transfers: Sit to/from [facility] Sit to Stand: Min guard Stand pivot transfers: Supervision       General transfer comment: Slight min guard to power up from sitting. VC for form and technique when completing sit/stand.    Balance Overall balance assessment: Needs assistance Sitting-balance support: No upper extremity supported;Feet supported Sitting balance-Leahy Scale: Good     Standing balance support: Single extremity supported;During functional activity Standing balance-Leahy Scale: Fair Standing balance comment: standing at sink for grooming.         ADL either performed or assessed with clinical judgement   ADL Overall ADL's : Needs assistance/impaired Eating/Feeding: Modified independent   Grooming: Wash/dry hands;Wash/dry face;Brushing hair;Supervision/safety;Standing Grooming Details (indicate cue type and reason): at sink with walker Upper Body Bathing: Set up;Sitting   Lower Body Bathing: Moderate assistance;Sitting/lateral leans   Upper Body Dressing : Set up;Sitting   Lower Body Dressing: Moderate assistance;Sitting/lateral leans;Sit to/from stand   Toilet Transfer: Nature conservation officer;Ambulation;RW   Toileting- Clothing Manipulation and Hygiene: Supervision/safety;Sitting/lateral lean        Vision Baseline Vision/History: No visual deficits Patient Visual Report: No change from baseline              Pertinent Vitals/Pain Pain Assessment: 0-10 Pain Score: 10-Worst pain ever Pain Location: Back Pain Descriptors / Indicators: Aching;Constant Pain Intervention(s): Patient requesting pain meds-RN notified;Repositioned;Monitored during session     Hand Dominance     Extremity/Trunk Assessment Upper Extremity Assessment Upper Extremity Assessment: Overall WFL for tasks assessed   Lower Extremity Assessment  Lower  Extremity Assessment: Defer to PT evaluation       Communication Communication Communication: No difficulties   Cognition Arousal/Alertness: Awake/alert Behavior During Therapy: WFL for tasks assessed/performed Overall Cognitive Status: Within Functional Limits for tasks assessed                    Home Living Family/patient expects to be discharged to:: Private residence Living Arrangements: Alone Available Help at Discharge: Friend(s);Available PRN/intermittently Type of Home: Apartment Home Access: Elevator     Home Layout: One level     Bathroom Shower/Tub: Tub/shower unit;Curtain   Biochemist, clinical: Standard     Home Equipment: Toilet riser;Walker - 4 wheels;Cane - quad;Cane - single point;Grab bars - tub/shower;Shower seat   Additional Comments: Pt reports that she needs a new shower seat as her's failed her causing her to fall in the shower.      Prior Functioning/Environment Level of Independence: Independent with assistive device(s);Needs assistance  Gait / Transfers Assistance Needed: Pt reports that her MD told her to use her walker at all times versus the cane as it is safer. ADL's / Homemaking Assistance Needed: Modified independent with Basic ADL tasks. Patient is unable to donn/doff socks herself and requires assist from her CNA. She has a CNA 3 days/week for assistance with cooking, cleaning, laundry, medication management, and transportation to appointments.   Comments: Pt has an elevated standard bed.        OT Problem List: Decreased knowledge of use of DME or AE      OT Treatment/Interventions: Self-care/ADL training;DME and/or AE instruction;Patient/family education    OT Goals(Current goals can be found in the care plan section) Acute Rehab OT Goals Patient Stated Goal: To go home OT Goal Formulation: With patient Time For Goal Achievement: 04/07/19 Potential to Achieve Goals: Good  OT Frequency: Min 2X/week    AM-PAC OT "6 Clicks"  Daily Activity     Outcome Measure Help from another person eating meals?: None Help from another person taking care of personal grooming?: A Little Help from another person toileting, which includes using toliet, bedpan, or urinal?: A Little Help from another person bathing (including washing, rinsing, drying)?: A Lot Help from another person to put on and taking off regular upper body clothing?: None Help from another person to put on and taking off regular lower body clothing?: A Lot 6 Click Score: 18   End of Session Equipment Utilized During Treatment: Rolling walker Nurse Communication: Patient requests pain meds  Activity Tolerance: Patient tolerated treatment well Patient left: in chair;with call bell/phone within reach;with chair alarm set  OT Visit Diagnosis: Muscle weakness (generalized) (M62.81)                Time: 1937-9024 OT Time Calculation (min): 26 min Charges:  OT General Charges $OT Visit: 1 Visit OT Evaluation $OT Eval Low Complexity: 1 Low OT Treatments $Self Care/Home Management : 8-22 mins  Ailene Ravel, OTR/L,CBIS  [phone]   Shalva Rozycki, Clarene Duke 03/24/2019, 9:20 AM

## 2019-03-24 NOTE — Care Management Important Message (Signed)
Important Message  Patient Details  Name: [name] MRN: [id number] Date of Birth: March 29, 1961   Medicare Important Message Given:  Yes     Tommy Medal 03/24/2019, 3:14 PM

## 2019-03-24 NOTE — Progress Notes (Signed)
PROGRESS NOTE  [name] DTH:[id number] DOB: 1960/12/22 DOA: 03/22/2019 PCP: Kathyrn Drown, MD  Brief History: 58 year old female with a history of cognitive delay, lung nodules, IBS, hypertension, diet-controlled diabetes mellitus, anxiety, hyperlipidemia presenting after being found in her bathtub unable to get up. At the time of my evaluation, the patient is alert, conversant, and able to answer questions appropriately although she is a difficult historian secondary to her cognitive delay. The patient's advocate and caregiver is at the bedside to supplement the history. The patient is able to tell me that she slipped and fell in the bathroom on the morning of 03/20/2019. Because of pain in her head, neck, back, and legs, she was not able to get up. The patient states that she did not lose consciousness or "fall asleep".The patient's caregiver was out of town on that day. When he returned to town on 03/21/2019, he was not able to get in touch with the patient. As result, EMS was activated. Upon arrival, the patient was found in the bathtub with a CBG 38.The patient was given D50. As they were taking the patient into the ambulance, the patient was awake and able to verbalize to her caregiver, "meet me in the ER".In the emergency department, the patient was initially obtunded and unable to provide any history. However as time progressed, it appeared that the patient's mental status improved. The patient states that she had been in her usual state of health prior to the events discussed above. She had denied any new changes in her medications. She denied any fevers, chills, chest pain, shortness breath, cough, hemoptysis, hematochezia, melena. She states that she has chronic abdominal cramping and pain as well as chronic back pain and leg pain. She continues to smoke 1/2 pack/day x 40+years. She denies any alcohol or illicit drug use. She stated that on 03/18/2019, she  had 3 episodes of emesis, but has not had any emesis since then. In the emergency department, the patient was afebrile hemodynamically stable. WBC was 7.9. BMP showed a potassium 2.6. AST 140, ALT 34, alk phosphatase 77, bilirubin 0.4.CT of the abdomen and pelvis was negative for any acute findings. CTA chest was negative for any traumatic injury or acute findings. CT of the brain was negative. CT of the cervical spine was negative for fracture or subluxation. Urinalysis showed pyuria 21-50 WBC. Troponins were 46>>>50.CPK was 8109.  Assessment/Plan: Rhabdomyolysis -CPK 8109 at the time of presentation -continue IV fluids -CK 8109>>>3630>>>2449 -am CK -PT eval-->outpt PT  Acute metabolic encephalopathy -In part due to hypoglycemia and medications -The patient is not on any hypoglycemic agents at home nor insulin -9/13/2020am--caregiver at the bedside states that her mental status at baseline -TSH 4.492 -Urine culture unrevealing  Pyuria -discontinue ceftriaxone -culture unrevealing  ?Syncope/elevated troponin -Echocardiogram--EF 50-55%, dilated IVC, +diastolic -Check orthostatic vital signs--positive by pulse 58>>>88 -Remain on telemetry--no concerning dysrhythmia -Personally reviewed EKG--sinus rhythm, nonspecific T wave changes, early repolarization -troponin trends not consistent with ACS  Hypoglycemia/diabetes mellitus type 2 -Likely due to no oral intake for nearly 48 hours -Check C-peptide--3.6 -follow CBGs--hypoglycemia improved -02/12/2019 hemoglobin A1c 6.2  Transaminasemia -Elevated AST likely secondary to the patient's muscle breakdown -Trend LFTs -also suspect a degree of NASH -CT abd/pelvis unremarkable -tolerating diet  Lower extremity pain and edema -Venous duplex--neg -B12--133-->supplement with IM B12 -folate 7.4  Hypokalemia -Replete -Check magnesium--1.7-->given 2 grams  Mood disorder -Continue Cogentin, Lamictal,  paliperidone, sertraline  Hyperlipidemia -holding  statin due to elevated CK  Tobacco abuse I have discussed tobacco cessation with the patient. I have counseled the patient regarding the negative impacts of continued tobacco use including but not limited to lung cancer, COPD, and cardiovascular disease. I have discussed alternatives to tobacco and modalities that may help facilitate tobacco cessation including but not limited to biofeedback, hypnosis, and medications. Total time spent with tobacco counseling was 4 minutes.        Disposition Plan: Home in [place] bedside updated 9/13  Consultants:none  Code Status: FULL  DVT Prophylaxis: [employer] Lovenox   Procedures: As Listed in Progress Note Above  Antibiotics: Ceftriaxone 9/12>>>9/13    Subjective: Pt complains of leg pain/cramps.  Denies f/c, cp, sob, n/v/d.  Has loose stool.  No abd pain.  No headache  Objective: Vitals:   03/23/19 2114 03/24/19 0329 03/24/19 1519 03/24/19 1528  BP: 110/67 112/63 (!) 144/113 (!) 145/71  Pulse: 71 64 64 62  Resp: _0 Temp: 98.2 F (36.8 C) 98.9 F (37.2 C) 98.5 F (36.9 C)   TempSrc:  Oral Oral   SpO2: 99% 100% 99%   Weight:      Height:        Intake/Output Summary (Last 24 hours) at 03/24/2019 1613 Last data filed at 03/24/2019 1300 Gross per 24 hour  Intake 480 ml  Output 202 ml  Net 278 ml   Weight change:  Exam:   General:  Pt is alert, follows commands appropriately, not in acute distress  HEENT: No icterus, No thrush, No neck mass, [place]/AT  Cardiovascular: RRR, S1/S2, no rubs, no gallops  Respiratory: CTA bilaterally, no wheezing, no crackles, no rhonchi  Abdomen: Soft/+BS, non tender, non distended, no guarding  Extremities: 2 + LE edema, No lymphangitis, No petechiae, No rashes, no synovitis   Data Reviewed: I have personally reviewed following labs and imaging studies Basic  Metabolic Panel: Recent Labs  Lab 03/22/19 0130 03/22/19 1034 03/23/19 0649 03/24/19 0447  NA 135  --  138 138  K 2.6*  --  3.4* 3.2*  CL 95*  --  102 102  CO2 28  --  28 27  GLUCOSE 149*  --  90 99  BUN 14  --  8 5*  CREATININE 0.83  --  0.58 0.51  CALCIUM 8.5*  --  8.6* 8.7*  MG  --  1.7  --  2.0  PHOS  --  3.7  --   --    Liver Function Tests: Recent Labs  Lab 03/22/19 0130 03/23/19 0649 03/24/19 0447  AST 148* 110* 111*  ALT 34 34 59*  ALKPHOS 77 66 79  BILITOT 0.4 0.5 0.4  PROT 7.2 6.7 6.6  ALBUMIN 3.7 3.4* 3.3*   No results for input(s): LIPASE, AMYLASE in the last 168 hours. No results for input(s): AMMONIA in the last 168 hours. Coagulation Profile: No results for input(s): INR, PROTIME in the last 168 hours. CBC: Recent Labs  Lab 03/22/19 0130  WBC 7.9  NEUTROABS 5.9  HGB 12.2  HCT 37.4  MCV 94.7  PLT 196   Cardiac Enzymes: Recent Labs  Lab 03/22/19 0130 03/23/19 0649 03/24/19 0447  CKTOTAL 8,109* 3,630* 2,449*   BNP: Invalid input(s): POCBNP CBG: Recent Labs  Lab 03/22/19 0029 03/22/19 0300  GLUCAP 187* 109*   HbA1C: No results for input(s): HGBA1C in the last 72 hours. Urine analysis:    Component Value Date/Time   COLORURINE YELLOW  03/22/2019 0310   APPEARANCEUR HAZY (A) 03/22/2019 0310   LABSPEC 1.021 03/22/2019 0310   PHURINE 5.0 03/22/2019 0310   GLUCOSEU NEGATIVE 03/22/2019 0310   HGBUR MODERATE (A) 03/22/2019 0310   BILIRUBINUR NEGATIVE 03/22/2019 0310   KETONESUR NEGATIVE 03/22/2019 0310   PROTEINUR 30 (A) 03/22/2019 0310   UROBILINOGEN 0.2 01/27/2014 1556   NITRITE NEGATIVE 03/22/2019 0310   LEUKOCYTESUR NEGATIVE 03/22/2019 0310   Sepsis Labs: _0 (procalcitonin:4,lacticidven:4) ) Recent Results (from the past 240 hour(s))  Urine culture     Status: Abnormal   Collection Time: 03/22/19  3:10 AM   Specimen: Urine, Clean Catch  Result Value Ref Range Status   Specimen Description   Final    URINE, CLEAN  CATCH Performed at [facility], [address] [place], [place] [address]    Special Requests   Final    Normal Performed at [facility], [address] [place], [place] [address]    [place], [place] (A)  Final   Report Status 03/23/2019 FINAL  Final  SARS Coronavirus 2 [facility] order, Performed in [facility] hospital lab) Nasopharyngeal Nasopharyngeal Swab     Status: None   Collection Time: 03/22/19  6:02 AM   Specimen: Nasopharyngeal Swab  Result Value Ref Range Status   SARS Coronavirus 2 NEGATIVE NEGATIVE Final    Comment: (NOTE) If result is NEGATIVE SARS-CoV-2 target nucleic acids are NOT DETECTED. The SARS-CoV-2 RNA is generally detectable in upper and lower  respiratory specimens during the acute phase of infection. The lowest  concentration of SARS-CoV-2 viral copies this assay can detect is 250  copies / mL. A negative result does not preclude SARS-CoV-2 infection  and should not be used as the sole basis for treatment or other  patient management decisions.  A negative result may occur with  improper specimen collection / handling, submission of specimen other  than nasopharyngeal swab, presence of viral mutation(s) within the  areas targeted by this assay, and inadequate number of viral copies  (<250 copies / mL). A negative result must be combined with clinical  observations, patient history, and epidemiological information. If result is POSITIVE SARS-CoV-2 target nucleic acids are DETECTED. The SARS-CoV-2 RNA is generally detectable in upper and lower  respiratory specimens dur ing the acute phase of infection.  Positive  results are indicative of active infection with SARS-CoV-2.  Clinical  correlation with patient history and other diagnostic information is  necessary to determine patient infection status.  Positive results do  not rule out bacterial infection or co-infection with other viruses. If result is PRESUMPTIVE  POSTIVE SARS-CoV-2 nucleic acids MAY BE PRESENT.   A presumptive positive result was obtained on the submitted specimen  and confirmed on repeat testing.  While 2019 novel coronavirus  (SARS-CoV-2) nucleic acids may be present in the submitted sample  additional confirmatory testing may be necessary for epidemiological  and / or clinical management purposes  to differentiate between  SARS-CoV-2 and other Sarbecovirus currently known to infect humans.  If clinically indicated additional testing with an alternate test  methodology [phone]) is advised. The SARS-CoV-2 RNA is generally  detectable in upper and lower respiratory sp ecimens during the acute  phase of infection. The expected result is Negative. Fact Sheet for Patients:  StrictlyIdeas.no Fact Sheet for Healthcare Providers: BankingDealers.co.za This test is not yet approved or cleared by the [place] FDA and has been authorized for detection and/or diagnosis of SARS-CoV-2 by FDA under an Emergency Use  Authorization (EUA).  This EUA will remain in effect (meaning this test can be used) for the duration of the COVID-19 declaration under Section 564(b)(1) of the Act, 21 U.S.C. section 360bbb-3(b)(1), unless the authorization is terminated or revoked sooner. Performed at [facility], [address], [place], [place] [address]      Scheduled Meds:  benztropine  1 mg Oral Daily   cyanocobalamin  1,000 mcg Intramuscular Daily   heparin  5,000 Units Subcutaneous Q8H   lamoTRIgine  100 mg Oral BID   lisinopril  2.5 mg Oral Daily   multivitamin with minerals  1 tablet Oral Daily   propranolol  10 mg Oral TID   sertraline  100 mg Oral Daily   traZODone  50 mg Oral QHS   Continuous Infusions:  0.9 % NaCl with KCl 20 mEq / L 125 mL/hr at 03/24/19 1014    Procedures/Studies: Ct Head Wo Contrast  Result Date: 03/22/2019 CLINICAL DATA:  Fall in the bathtub yesterday,  loss of consciousness with head injury. Headache and neck pain. EXAM: CT HEAD WITHOUT CONTRAST CT CERVICAL SPINE WITHOUT CONTRAST TECHNIQUE: Multidetector CT imaging of the head and cervical spine was performed following the standard protocol without intravenous contrast. Multiplanar CT image reconstructions of the cervical spine were also generated. COMPARISON:  Head CT 01/27/2014 FINDINGS: CT HEAD FINDINGS Brain: No intracranial hemorrhage, mass effect, or midline shift. No hydrocephalus. The basilar cisterns are patent. No evidence of territorial infarct or acute ischemia. No extra-axial or intracranial fluid collection. Vascular: No hyperdense vessel or unexpected calcification. Skull: No fracture or focal lesion. Sinuses/Orbits: Paranasal sinuses and mastoid air cells are clear. The visualized orbits are unremarkable. Other: None. CT CERVICAL SPINE FINDINGS Alignment: Straightening of normal lordosis. No traumatic subluxation. Grade 1 anterolisthesis of C2 on C3 appears degenerative. Skull base and vertebrae: No acute fracture. Vertebral body heights are maintained. The dens and skull base are intact. Soft tissues and spinal canal: No prevertebral fluid or swelling. No visible canal hematoma. Disc levels: Diffuse disc space narrowing and endplate spurring. Multilevel facet hypertrophy. Degenerative changes colic is spinal canal stenosis the level of C4-C5, C5-C6, and C6-C7. Upper chest: Assessed on concurrent chest CT. Other: None. IMPRESSION: 1. Unremarkable noncontrast head CT. 2. Multilevel degenerative change throughout the cervical spine without acute fracture or subluxation. Electronically Signed   By: Keith Rake M.D.   On: 03/22/2019 03:53   Ct Chest Wo Contrast  Result Date: 03/22/2019 CLINICAL DATA:  Fall in the bathtub yesterday. Upper back and left chest pain. EXAM: CT CHEST WITHOUT CONTRAST CT ABDOMEN, AND PELVIS WITH CONTRAST TECHNIQUE: Multidetector CT imaging of the chest was performed  following stone protocol without IV contrast. Multidetector CT imaging of the abdomen and pelvis was performed following the standard protocol during bolus administration of intravenous contrast. CONTRAST:  126m OMNIPAQUE IOHEXOL 300 MG/ML  SOLN COMPARISON:  Chest CT 10/04/2017 FINDINGS: CT CHEST FINDINGS Cardiovascular: Limited assessment for vascular injury given lack of IV contrast. Aortic atherosclerosis. No periaortic stranding. Heart is normal in size. No pericardial effusion. Mediastinum/Nodes: No mediastinal hemorrhage or hematoma. No pneumomediastinum. The esophagus is decompressed. No visualized thyroid nodule. Lungs/Pleura: No pneumothorax. No pulmonary contusion. No pleural fluid. Paramediastinal right middle lobe pulmonary nodule measures 18 x 12 x 20 mm (volume = 2.300 cm^3), previously 16 x 10 x 21 mm (volume = 1.8 cm^3). No new pulmonary nodule. Trachea and mainstem bronchi are patent. Musculoskeletal: No acute fracture of the ribs, sternum, thoracic spine, included clavicles or  shoulder girdles. No confluent body wall contusion. CT ABDOMEN PELVIS FINDINGS Hepatobiliary: No hepatic injury or perihepatic hematoma. Gallbladder is unremarkable. Pancreas: No evidence of injury. No ductal dilatation or inflammation. Spleen: No splenic injury or perisplenic hematoma. Adrenals/Urinary Tract: No adrenal hemorrhage or renal injury identified. Bladder is decompressed but otherwise unremarkable. Stomach/Bowel: Evidence of bowel injury. No bowel wall thickening or inflammation. No free air or free fluid. Normal appendix incidentally noted. Vascular/Lymphatic: No vascular injury. Abdominal aorta and IVC are intact. No retroperitoneal fluid. No adenopathy. Reproductive: Status post hysterectomy. No adnexal masses. Other: No confluent body wall contusion, portions of the right lateral abdominal wall partially excluded from the field of view. No free air or free fluid. Tiny fat containing umbilical hernia.  Musculoskeletal: There are no acute or suspicious osseous abnormalities. No fracture of the lumbar spine or pelvis. IMPRESSION: 1. No evidence of acute traumatic injury to the chest, abdomen, or pelvis. 2. Known right middle lobe pulmonary nodule slightly larger than in 2019 exam. Recommend continued clinical radiologic follow-up. Aortic Atherosclerosis (ICD10-I70.0). Electronically Signed   By: Keith Rake M.D.   On: 03/22/2019 04:07   Ct Cervical Spine Wo Contrast  Result Date: 03/22/2019 CLINICAL DATA:  Fall in the bathtub yesterday, loss of consciousness with head injury. Headache and neck pain. EXAM: CT HEAD WITHOUT CONTRAST CT CERVICAL SPINE WITHOUT CONTRAST TECHNIQUE: Multidetector CT imaging of the head and cervical spine was performed following the standard protocol without intravenous contrast. Multiplanar CT image reconstructions of the cervical spine were also generated. COMPARISON:  Head CT 01/27/2014 FINDINGS: CT HEAD FINDINGS Brain: No intracranial hemorrhage, mass effect, or midline shift. No hydrocephalus. The basilar cisterns are patent. No evidence of territorial infarct or acute ischemia. No extra-axial or intracranial fluid collection. Vascular: No hyperdense vessel or unexpected calcification. Skull: No fracture or focal lesion. Sinuses/Orbits: Paranasal sinuses and mastoid air cells are clear. The visualized orbits are unremarkable. Other: None. CT CERVICAL SPINE FINDINGS Alignment: Straightening of normal lordosis. No traumatic subluxation. Grade 1 anterolisthesis of C2 on C3 appears degenerative. Skull base and vertebrae: No acute fracture. Vertebral body heights are maintained. The dens and skull base are intact. Soft tissues and spinal canal: No prevertebral fluid or swelling. No visible canal hematoma. Disc levels: Diffuse disc space narrowing and endplate spurring. Multilevel facet hypertrophy. Degenerative changes colic is spinal canal stenosis the level of C4-C5, C5-C6, and  C6-C7. Upper chest: Assessed on concurrent chest CT. Other: None. IMPRESSION: 1. Unremarkable noncontrast head CT. 2. Multilevel degenerative change throughout the cervical spine without acute fracture or subluxation. Electronically Signed   By: Keith Rake M.D.   On: 03/22/2019 03:53   Ct Abdomen Pelvis W Contrast  Result Date: 03/22/2019 CLINICAL DATA:  Fall in the bathtub yesterday. Upper back and left chest pain. EXAM: CT CHEST WITHOUT CONTRAST CT ABDOMEN, AND PELVIS WITH CONTRAST TECHNIQUE: Multidetector CT imaging of the chest was performed following stone protocol without IV contrast. Multidetector CT imaging of the abdomen and pelvis was performed following the standard protocol during bolus administration of intravenous contrast. CONTRAST:  19m OMNIPAQUE IOHEXOL 300 MG/ML  SOLN COMPARISON:  Chest CT 10/04/2017 FINDINGS: CT CHEST FINDINGS Cardiovascular: Limited assessment for vascular injury given lack of IV contrast. Aortic atherosclerosis. No periaortic stranding. Heart is normal in size. No pericardial effusion. Mediastinum/Nodes: No mediastinal hemorrhage or hematoma. No pneumomediastinum. The esophagus is decompressed. No visualized thyroid nodule. Lungs/Pleura: No pneumothorax. No pulmonary contusion. No pleural fluid. Paramediastinal right middle lobe pulmonary nodule measures 18  x 12 x 20 mm (volume = 2.300 cm^3), previously 16 x 10 x 21 mm (volume = 1.8 cm^3). No new pulmonary nodule. Trachea and mainstem bronchi are patent. Musculoskeletal: No acute fracture of the ribs, sternum, thoracic spine, included clavicles or shoulder girdles. No confluent body wall contusion. CT ABDOMEN PELVIS FINDINGS Hepatobiliary: No hepatic injury or perihepatic hematoma. Gallbladder is unremarkable. Pancreas: No evidence of injury. No ductal dilatation or inflammation. Spleen: No splenic injury or perisplenic hematoma. Adrenals/Urinary Tract: No adrenal hemorrhage or renal injury identified. Bladder is  decompressed but otherwise unremarkable. Stomach/Bowel: Evidence of bowel injury. No bowel wall thickening or inflammation. No free air or free fluid. Normal appendix incidentally noted. Vascular/Lymphatic: No vascular injury. Abdominal aorta and IVC are intact. No retroperitoneal fluid. No adenopathy. Reproductive: Status post hysterectomy. No adnexal masses. Other: No confluent body wall contusion, portions of the right lateral abdominal wall partially excluded from the field of view. No free air or free fluid. Tiny fat containing umbilical hernia. Musculoskeletal: There are no acute or suspicious osseous abnormalities. No fracture of the lumbar spine or pelvis. IMPRESSION: 1. No evidence of acute traumatic injury to the chest, abdomen, or pelvis. 2. Known right middle lobe pulmonary nodule slightly larger than in 2019 exam. Recommend continued clinical radiologic follow-up. Aortic Atherosclerosis (ICD10-I70.0). Electronically Signed   By: Keith Rake M.D.   On: 03/22/2019 04:07   US Venous Img Lower Bilateral  Result Date: 03/22/2019 CLINICAL DATA:  Pain, left greater than right EXAM: BILATERAL LOWER EXTREMITY VENOUS DOPPLER ULTRASOUND TECHNIQUE: Gray-scale sonography with compression, as well as color and duplex ultrasound, were performed to evaluate the deep venous system from the level of the common femoral vein through the popliteal and proximal calf veins. COMPARISON:  02/05/2003 FINDINGS: Normal compressibility of the common femoral, superficial femoral, and popliteal veins, as well as the proximal calf veins. No filling defects to suggest DVT on grayscale or color Doppler imaging. Doppler waveforms show normal direction of venous flow, normal respiratory phasicity and response to augmentation. 3.6 x 3.4 x 1 cm fluid collection in the left medial posterior popliteal fossa. IMPRESSION: 1. No femoropopliteal and no calf DVT in the visualized calf veins. If clinical symptoms are inconsistent or if  there are persistent or worsening symptoms, further imaging (possibly involving the iliac veins) may be warranted. 2. Small left Baker's cyst Electronically Signed   By: Lucrezia Europe M.D.   On: 03/22/2019 13:23    Orson Eva, DO  [facility] Pager [phone]  If 7PM-7AM, please contact night-coverage [email or web address] Password [email or web address] 03/24/2019, 4:13 PM   LOS: 2 days

## 2019-03-24 NOTE — Progress Notes (Signed)
Physical Therapy Treatment Patient Details Name: [name] MRN: [id number] DOB: 09-27-1960 Today's Date: 03/24/2019    History of Present Illness Patient is a 58 year old female admitted to ED 03/22/2019 after being found in bath tub by neighbor, unable to get up. Had been in bath tub for 36 hours. Admission diagnosis - rhabdomyolysis and acute metabolic encephalopathy. PMH: anxiety, DM Type 2, HTN, hyperglycemia, IBS, lung modules, mental retardation, myocardial infarction, migraines, sleep apnea, obesity, chronic emboli.    PT Comments    Patient demonstrates good return for using SPC with mostly 2 point gait pattern without loss of balance, slightly unsteady ataxic like movement and continued sitting up at bedside with visitor in room after therapy.  Patient will benefit from continued physical therapy in hospital and recommended venue below to increase strength, balance, endurance for safe ADLs and gait.    Follow Up Recommendations  Outpatient PT;Supervision - Intermittent     Equipment Recommendations  None recommended by PT    Recommendations for Other Services       Precautions / Restrictions Precautions Precautions: Fall Restrictions Weight Bearing Restrictions: No    Mobility  Bed Mobility               General bed mobility comments: presents seated at bedside  Transfers Overall transfer level: Modified independent Equipment used: Straight cane;None Transfers: Sit to/from [facility] Sit to Stand: Modified independent (Device/Increase time) Stand pivot transfers: Modified independent (Device/Increase time)       General transfer comment: slightly labored movement with or without SPC  Ambulation/Gait Ambulation/Gait assistance: Supervision Gait Distance (Feet): 200 Feet Assistive device: Straight cane Gait Pattern/deviations: Step-through pattern;Ataxic Gait velocity: slightly decreased   General Gait Details: slightly labored  ataxic like unsteady gait with step-through pattern using SPC, no loss of balance, limited secondary to fatigue   Stairs             Wheelchair Mobility    Modified Rankin (Stroke Patients Only)       Balance Overall balance assessment: Needs assistance Sitting-balance support: Feet supported;No upper extremity supported Sitting balance-Leahy Scale: Good     Standing balance support: During functional activity;No upper extremity supported Standing balance-Leahy Scale: Fair Standing balance comment: fair/good using SPC                            Cognition Arousal/Alertness: Awake/alert Behavior During Therapy: WFL for tasks assessed/performed Overall Cognitive Status: Within Functional Limits for tasks assessed                                        Exercises General Exercises - Lower Extremity Long Arc Quad: Seated;AROM;Strengthening;Both;10 reps Hip Flexion/Marching: Seated;AROM;Strengthening;Both;10 reps Toe Raises: Seated;AROM;Strengthening;Both;10 reps Heel Raises: Seated;AROM;Strengthening;Both;10 reps    General Comments        Pertinent Vitals/Pain Pain Assessment: No/denies pain    Home Living                      Prior Function            PT Goals (current goals can now be found in the care plan section) Acute Rehab PT Goals Patient Stated Goal: To go home PT Goal Formulation: With patient Time For Goal Achievement: 04/06/19 Progress towards PT goals: Progressing toward goals    Frequency  Min 3X/week      PT Plan Current plan remains appropriate    Co-evaluation              AM-PAC PT "6 Clicks" Mobility   Outcome Measure  Help needed turning from your back to your side while in a flat bed without using bedrails?: None Help needed moving from lying on your back to sitting on the side of a flat bed without using bedrails?: None Help needed moving to and from a bed to a chair (including  a wheelchair)?: None Help needed standing up from a chair using your arms (e.g., wheelchair or bedside chair)?: None Help needed to walk in hospital room?: A Little Help needed climbing 3-5 steps with a railing? : A Little 6 Click Score: 22    End of Session   Activity Tolerance: Patient tolerated treatment well;Patient limited by fatigue Patient left: in bed;with call bell/phone within reach;with family/visitor present(seated at bedside) Nurse Communication: Mobility status PT Visit Diagnosis: Unsteadiness on feet (R26.81);Other abnormalities of gait and mobility (R26.89);History of falling (Z91.81);Muscle weakness (generalized) (M62.81)     Time: 7628-3151 PT Time Calculation (min) (ACUTE ONLY): 25 min  Charges:  $Gait Training: 8-22 mins $Therapeutic Exercise: 8-22 mins                     3:50 PM, 03/24/19 Lonell Grandchild, MPT Physical Therapist with [facility] [phone] office [phone] mobile phone

## 2019-03-24 NOTE — Plan of Care (Signed)
  Problem: Acute Rehab OT Goals (only OT should resolve) Goal: Pt. Will Perform Lower Body Bathing Flowsheets (Taken 03/24/2019 0924) Pt Will Perform Lower Body Bathing:  sit to/from stand  sitting/lateral leans  with adaptive equipment  with supervision Note: Patient will demonstrate/simulate and/or voice understanding of use of AE, such as a LHS that will increase her safety and functional performance during bathing. Goal: Pt. Will Perform Lower Body Dressing Flowsheets (Taken 03/24/2019 0924) Pt Will Perform Lower Body Dressing:  with supervision  with adaptive equipment  sitting/lateral leans  sit to/from stand Note: Using sock aid and reacher

## 2019-03-24 NOTE — Patient Outreach (Addendum)
[facility] Care Management  03/24/2019  [name] 12/23/1960 [id number]    RN Health Coach was scheduled to call the patient to day for initial assessment but was notified that the patient was admitted to the hospital on 03/22/19 for Traumatic rhabdomyolysis.  RN Health Coach will send notification to the hospital liaison.  Plan:  RN Health Coach will close the program due to admission.  Lazaro Arms RN, BSN, [place] Direct Dial:  [phone]  Fax: [phone]

## 2019-03-24 NOTE — Discharge Summary (Signed)
Physician Discharge Summary  [name] DVV:[id number] DOB: 1961/07/08 DOA: 03/22/2019  PCP: Kathyrn Drown, MD  Admit date: 03/22/2019 Discharge date: 03/25/2019  Admitted From: Home Disposition:  Home  Recommendations for Outpatient Follow-up:  1. Follow up with PCP in 1-2 weeks 2. Please obtain BMP/CBC in one week   Discharge Condition: Stable CODE STATUS: FULL Diet recommendation: Heart Healthy   Brief/Interim Summary: 58 year old female with a history of cognitive delay, lung nodules, IBS, hypertension, diet-controlled diabetes mellitus, anxiety, hyperlipidemia presenting after being found in her bathtub unable to get up. At the time of my evaluation, the patient is alert, conversant, and able to answer questions appropriately although she is a difficult historian secondary to her cognitive delay. The patient's advocate and caregiver is at the bedside to supplement the history. The patient is able to tell me that she slipped and fell in the bathroom on the morning of 03/20/2019. Because of pain in her head, neck, back, and legs, she was not able to get up. The patient states that she did not lose consciousness or "fall asleep".The patient's caregiver was out of town on that day. When he returned to town on 03/21/2019, he was not able to get in touch with the patient. As result, EMS was activated. Upon arrival, the patient was found in the bathtub with a CBG 38.The patient was given D50. As they were taking the patient into the ambulance, the patient was awake and able to verbalize to her caregiver, "meet me in the ER".In the emergency department, the patient was initially obtunded and unable to provide any history. However as time progressed, it appeared that the patient's mental status improved. The patient states that she had been in her usual state of health prior to the events discussed above. She had denied any new changes in her medications. She denied any  fevers, chills, chest pain, shortness breath, cough, hemoptysis, hematochezia, melena. She states that she has chronic abdominal cramping and pain as well as chronic back pain and leg pain. She continues to smoke 1/2 pack/day x 40+years. She denies any alcohol or illicit drug use. She stated that on 03/18/2019, she had 3 episodes of emesis, but has not had any emesis since then. In the emergency department, the patient was afebrile hemodynamically stable. WBC was 7.9. BMP showed a potassium 2.6. AST 140, ALT 34, alk phosphatase 77, bilirubin 0.4.CT of the abdomen and pelvis was negative for any acute findings. CTA chest was negative for any traumatic injury or acute findings. CT of the brain was negative. CT of the cervical spine was negative for fracture or subluxation. Urinalysis showed pyuria 21-50 WBC. Troponins were 46>>>50.CPK was 8109.  CK continued to trend down with IVF.  On 03/25/19, patient and significant other wanted her to go home.  As pt is clinically improved and CK trended down to 1525, she was released with instructions to increase her fluid intake and avoid strenuous activity for at least on week.     Discharge Diagnoses:  Rhabdomyolysis -CPK 8109 at the time of presentation -continueIV fluids -CK 8109>>>3630>>>2449>>>1525 -PT eval-->outpt PT  Acute metabolic encephalopathy -In part due to hypoglycemiaand medications -The patient is not on any hypoglycemic agents at home nor insulin -9/13/2020am--caregiver at the bedside states that her mental status at baseline -TSH 4.492 -Urine culture unrevealing  Pyuria -discontinueceftriaxone -culture unrevealing -no dysuria  ?Syncope/elevated troponin -Echocardiogram--EF 50-55%, dilated IVC, +diastolic dysfunction -Initial orthostatic vital signs--positive by pulse 58>>>88 -Remain on telemetry--no concerning dysrhythmia throughout the  hospitalization -Personally reviewed EKG--sinus rhythm, nonspecific T wave  changes, early repolarization -troponin trends not consistent with ACS  Hypoglycemia/diabetes mellitus type 2 -Likely due to no oral intake for nearly 48 hours -Check C-peptide--3.6 -follow CBGs--hypoglycemia improved -02/12/2019 hemoglobin A1c 6.2  Transaminasemia -Elevated AST likely secondary to the patient's muscle breakdown -Trend LFTs--improving -also suspect a degree of NASH -CT abd/pelvis unremarkable -tolerating diet  Lower extremity pain and edema -Venous duplex--neg -B12--133-->supplement with IM B12 -folate 7.4  Hypokalemia -Repleted -Check magnesium--1.7-->given 2 grams  Mood disorder -Continue Cogentin, Lamictal, paliperidone, sertraline  Hyperlipidemia -holdingstatin due to elevated CK-->restart one week after d/c  Tobacco abuse I have discussed tobacco cessation with the patient. I have counseled the patient regarding the negative impacts of continued tobacco use including but not limited to lung cancer, COPD, and cardiovascular disease. I have discussed alternatives to tobacco and modalities that may help facilitate tobacco cessation including but not limited to biofeedback, hypnosis, and medications. Total time spent with tobacco counseling was 4 minutes. -no oxygen desaturation with ambulation     Discharge Instructions   Allergies as of 03/25/2019      Reactions   Thorazine [chlorpromazine Hcl] Anaphylaxis   Acetaminophen Other (See Comments)   Makes pt dizzy   Aspirin Other (See Comments)   seizure   Aspirin-acetaminophen-caffeine Other (See Comments)   seizure   Nsaids Nausea And Vomiting   Other    Acidic foods   Penicillins Nausea And Vomiting   Has patient had a PCN reaction causing immediate rash, facial/tongue/throat swelling, SOB or lightheadedness with hypotension:Yes Has patient had a PCN reaction causing severe rash involving mucus membranes or skin necrosis:Yes Has patient had a PCN reaction that required  hospitalization:Yes Has patient had a PCN reaction occurring within the last 10 years:No If all of the above answers are "NO", then may proceed with Cephalosporin use.   Tomato Rash      Medication List    STOP taking these medications   fluticasone 50 MCG/ACT nasal spray Commonly known as: FLONASE     TAKE these medications   atorvastatin 10 MG tablet Commonly known as: LIPITOR Take 1 tablet (10 mg total) by mouth daily. Start 04/01/19 Start taking on: April 01, 2019 What changed:   additional instructions  These instructions start on April 01, 2019. If you are unsure what to do until then, ask your doctor or other care provider.   benztropine 1 MG tablet Commonly known as: COGENTIN TAKE 1 TABLET BY MOUTH ONCE A DAY.   calcium-vitamin D 500-200 MG-UNIT tablet Commonly known as: OSCAL WITH D Take 1 tablet by mouth 3 (three) times daily.   dicyclomine 10 MG capsule Commonly known as: BENTYL TAKE 1 CAPSULE BY MOUTH FOUR TIMES DAILY BEFORE MEALS AND AT BEDTIME FOR LOOSE STOOLS.HOLD FOR CONSTIPATION   hydrocortisone 2.5 % rectal cream Commonly known as: ANUSOL-HC Place 1 application rectally 2 (two) times daily.   lamoTRIgine 100 MG tablet Commonly known as: LAMICTAL TAKE 1 TABLET BY MOUTH TWICE A DAY.   lisinopril 2.5 MG tablet Commonly known as: ZESTRIL TAKE (1) TABLET BY MOUTH EACH MORNING. What changed:   how much to take  how to take this  when to take this   paliperidone 9 MG 24 hr tablet Commonly known as: INVEGA TAKE 1 TABLET BY MOUTH ONCE A DAY.   potassium chloride 10 MEQ tablet Commonly known as: K-DUR Take 1 tablet (10 mEq total) by mouth 2 (two) times daily.   propranolol 10  MG tablet Commonly known as: INDERAL Take 1 tablet (10 mg total) by mouth 3 (three) times daily.   RABEprazole 20 MG tablet Commonly known as: ACIPHEX TAKE 1 TABLET BY MOUTH TWICE A DAY.   sertraline 100 MG tablet Commonly known as: ZOLOFT TAKE 1 TABLET BY  MOUTH ONCE A DAY.   torsemide 20 MG tablet Commonly known as: DEMADEX TAKE (1) TABLET BY MOUTH EACH MORNING. What changed:   how much to take  how to take this  when to take this   traZODone 100 MG tablet Commonly known as: DESYREL TAKE 2 TABLETS(200MG) BY MOUTH AT BEDTIME. What changed: See the new instructions.      Follow-up Information    Kathyrn Drown, MD Follow up.   Specialty: Family Medicine Contact information: [place] [address] [phone]          Allergies  Allergen Reactions   Thorazine [Chlorpromazine Hcl] Anaphylaxis   Acetaminophen Other (See Comments)    Makes pt dizzy   Aspirin Other (See Comments)    seizure   Aspirin-Acetaminophen-Caffeine Other (See Comments)    seizure   Nsaids Nausea And Vomiting   Other     Acidic foods   Penicillins Nausea And Vomiting    Has patient had a PCN reaction causing immediate rash, facial/tongue/throat swelling, SOB or lightheadedness with hypotension:Yes Has patient had a PCN reaction causing severe rash involving mucus membranes or skin necrosis:Yes Has patient had a PCN reaction that required hospitalization:Yes Has patient had a PCN reaction occurring within the last 10 years:No If all of the above answers are "NO", then may proceed with Cephalosporin use.    Tomato Rash    Consultations:  none   Procedures/Studies: Ct Head Wo Contrast  Result Date: 03/22/2019 CLINICAL DATA:  Fall in the bathtub yesterday, loss of consciousness with head injury. Headache and neck pain. EXAM: CT HEAD WITHOUT CONTRAST CT CERVICAL SPINE WITHOUT CONTRAST TECHNIQUE: Multidetector CT imaging of the head and cervical spine was performed following the standard protocol without intravenous contrast. Multiplanar CT image reconstructions of the cervical spine were also generated. COMPARISON:  Head CT 01/27/2014 FINDINGS: CT HEAD FINDINGS Brain: No intracranial hemorrhage, mass effect, or  midline shift. No hydrocephalus. The basilar cisterns are patent. No evidence of territorial infarct or acute ischemia. No extra-axial or intracranial fluid collection. Vascular: No hyperdense vessel or unexpected calcification. Skull: No fracture or focal lesion. Sinuses/Orbits: Paranasal sinuses and mastoid air cells are clear. The visualized orbits are unremarkable. Other: None. CT CERVICAL SPINE FINDINGS Alignment: Straightening of normal lordosis. No traumatic subluxation. Grade 1 anterolisthesis of C2 on C3 appears degenerative. Skull base and vertebrae: No acute fracture. Vertebral body heights are maintained. The dens and skull base are intact. Soft tissues and spinal canal: No prevertebral fluid or swelling. No visible canal hematoma. Disc levels: Diffuse disc space narrowing and endplate spurring. Multilevel facet hypertrophy. Degenerative changes colic is spinal canal stenosis the level of C4-C5, C5-C6, and C6-C7. Upper chest: Assessed on concurrent chest CT. Other: None. IMPRESSION: 1. Unremarkable noncontrast head CT. 2. Multilevel degenerative change throughout the cervical spine without acute fracture or subluxation. Electronically Signed   By: Keith Rake M.D.   On: 03/22/2019 03:53   Ct Chest Wo Contrast  Result Date: 03/22/2019 CLINICAL DATA:  Fall in the bathtub yesterday. Upper back and left chest pain. EXAM: CT CHEST WITHOUT CONTRAST CT ABDOMEN, AND PELVIS WITH CONTRAST TECHNIQUE: Multidetector CT imaging of the chest was  performed following stone protocol without IV contrast. Multidetector CT imaging of the abdomen and pelvis was performed following the standard protocol during bolus administration of intravenous contrast. CONTRAST:  165m OMNIPAQUE IOHEXOL 300 MG/ML  SOLN COMPARISON:  Chest CT 10/04/2017 FINDINGS: CT CHEST FINDINGS Cardiovascular: Limited assessment for vascular injury given lack of IV contrast. Aortic atherosclerosis. No periaortic stranding. Heart is normal in size.  No pericardial effusion. Mediastinum/Nodes: No mediastinal hemorrhage or hematoma. No pneumomediastinum. The esophagus is decompressed. No visualized thyroid nodule. Lungs/Pleura: No pneumothorax. No pulmonary contusion. No pleural fluid. Paramediastinal right middle lobe pulmonary nodule measures 18 x 12 x 20 mm (volume = 2.300 cm^3), previously 16 x 10 x 21 mm (volume = 1.8 cm^3). No new pulmonary nodule. Trachea and mainstem bronchi are patent. Musculoskeletal: No acute fracture of the ribs, sternum, thoracic spine, included clavicles or shoulder girdles. No confluent body wall contusion. CT ABDOMEN PELVIS FINDINGS Hepatobiliary: No hepatic injury or perihepatic hematoma. Gallbladder is unremarkable. Pancreas: No evidence of injury. No ductal dilatation or inflammation. Spleen: No splenic injury or perisplenic hematoma. Adrenals/Urinary Tract: No adrenal hemorrhage or renal injury identified. Bladder is decompressed but otherwise unremarkable. Stomach/Bowel: Evidence of bowel injury. No bowel wall thickening or inflammation. No free air or free fluid. Normal appendix incidentally noted. Vascular/Lymphatic: No vascular injury. Abdominal aorta and IVC are intact. No retroperitoneal fluid. No adenopathy. Reproductive: Status post hysterectomy. No adnexal masses. Other: No confluent body wall contusion, portions of the right lateral abdominal wall partially excluded from the field of view. No free air or free fluid. Tiny fat containing umbilical hernia. Musculoskeletal: There are no acute or suspicious osseous abnormalities. No fracture of the lumbar spine or pelvis. IMPRESSION: 1. No evidence of acute traumatic injury to the chest, abdomen, or pelvis. 2. Known right middle lobe pulmonary nodule slightly larger than in 2019 exam. Recommend continued clinical radiologic follow-up. Aortic Atherosclerosis (ICD10-I70.0). Electronically Signed   By: MKeith RakeM.D.   On: 03/22/2019 04:07   Ct Cervical Spine Wo  Contrast  Result Date: 03/22/2019 CLINICAL DATA:  Fall in the bathtub yesterday, loss of consciousness with head injury. Headache and neck pain. EXAM: CT HEAD WITHOUT CONTRAST CT CERVICAL SPINE WITHOUT CONTRAST TECHNIQUE: Multidetector CT imaging of the head and cervical spine was performed following the standard protocol without intravenous contrast. Multiplanar CT image reconstructions of the cervical spine were also generated. COMPARISON:  Head CT 01/27/2014 FINDINGS: CT HEAD FINDINGS Brain: No intracranial hemorrhage, mass effect, or midline shift. No hydrocephalus. The basilar cisterns are patent. No evidence of territorial infarct or acute ischemia. No extra-axial or intracranial fluid collection. Vascular: No hyperdense vessel or unexpected calcification. Skull: No fracture or focal lesion. Sinuses/Orbits: Paranasal sinuses and mastoid air cells are clear. The visualized orbits are unremarkable. Other: None. CT CERVICAL SPINE FINDINGS Alignment: Straightening of normal lordosis. No traumatic subluxation. Grade 1 anterolisthesis of C2 on C3 appears degenerative. Skull base and vertebrae: No acute fracture. Vertebral body heights are maintained. The dens and skull base are intact. Soft tissues and spinal canal: No prevertebral fluid or swelling. No visible canal hematoma. Disc levels: Diffuse disc space narrowing and endplate spurring. Multilevel facet hypertrophy. Degenerative changes colic is spinal canal stenosis the level of C4-C5, C5-C6, and C6-C7. Upper chest: Assessed on concurrent chest CT. Other: None. IMPRESSION: 1. Unremarkable noncontrast head CT. 2. Multilevel degenerative change throughout the cervical spine without acute fracture or subluxation. Electronically Signed   By: MKeith RakeM.D.   On: 03/22/2019 03:53  Ct Abdomen Pelvis W Contrast  Result Date: 03/22/2019 CLINICAL DATA:  Fall in the bathtub yesterday. Upper back and left chest pain. EXAM: CT CHEST WITHOUT CONTRAST CT  ABDOMEN, AND PELVIS WITH CONTRAST TECHNIQUE: Multidetector CT imaging of the chest was performed following stone protocol without IV contrast. Multidetector CT imaging of the abdomen and pelvis was performed following the standard protocol during bolus administration of intravenous contrast. CONTRAST:  146m OMNIPAQUE IOHEXOL 300 MG/ML  SOLN COMPARISON:  Chest CT 10/04/2017 FINDINGS: CT CHEST FINDINGS Cardiovascular: Limited assessment for vascular injury given lack of IV contrast. Aortic atherosclerosis. No periaortic stranding. Heart is normal in size. No pericardial effusion. Mediastinum/Nodes: No mediastinal hemorrhage or hematoma. No pneumomediastinum. The esophagus is decompressed. No visualized thyroid nodule. Lungs/Pleura: No pneumothorax. No pulmonary contusion. No pleural fluid. Paramediastinal right middle lobe pulmonary nodule measures 18 x 12 x 20 mm (volume = 2.300 cm^3), previously 16 x 10 x 21 mm (volume = 1.8 cm^3). No new pulmonary nodule. Trachea and mainstem bronchi are patent. Musculoskeletal: No acute fracture of the ribs, sternum, thoracic spine, included clavicles or shoulder girdles. No confluent body wall contusion. CT ABDOMEN PELVIS FINDINGS Hepatobiliary: No hepatic injury or perihepatic hematoma. Gallbladder is unremarkable. Pancreas: No evidence of injury. No ductal dilatation or inflammation. Spleen: No splenic injury or perisplenic hematoma. Adrenals/Urinary Tract: No adrenal hemorrhage or renal injury identified. Bladder is decompressed but otherwise unremarkable. Stomach/Bowel: Evidence of bowel injury. No bowel wall thickening or inflammation. No free air or free fluid. Normal appendix incidentally noted. Vascular/Lymphatic: No vascular injury. Abdominal aorta and IVC are intact. No retroperitoneal fluid. No adenopathy. Reproductive: Status post hysterectomy. No adnexal masses. Other: No confluent body wall contusion, portions of the right lateral abdominal wall partially excluded  from the field of view. No free air or free fluid. Tiny fat containing umbilical hernia. Musculoskeletal: There are no acute or suspicious osseous abnormalities. No fracture of the lumbar spine or pelvis. IMPRESSION: 1. No evidence of acute traumatic injury to the chest, abdomen, or pelvis. 2. Known right middle lobe pulmonary nodule slightly larger than in 2019 exam. Recommend continued clinical radiologic follow-up. Aortic Atherosclerosis (ICD10-I70.0). Electronically Signed   By: MKeith RakeM.D.   On: 03/22/2019 04:07   [place]Venous Img Lower Bilateral  Result Date: 03/22/2019 CLINICAL DATA:  Pain, left greater than right EXAM: BILATERAL LOWER EXTREMITY VENOUS DOPPLER ULTRASOUND TECHNIQUE: Gray-scale sonography with compression, as well as color and duplex ultrasound, were performed to evaluate the deep venous system from the level of the common femoral vein through the popliteal and proximal calf veins. COMPARISON:  02/05/2003 FINDINGS: Normal compressibility of the common femoral, superficial femoral, and popliteal veins, as well as the proximal calf veins. No filling defects to suggest DVT on grayscale or color Doppler imaging. Doppler waveforms show normal direction of venous flow, normal respiratory phasicity and response to augmentation. 3.6 x 3.4 x 1 cm fluid collection in the left medial posterior popliteal fossa. IMPRESSION: 1. No femoropopliteal and no calf DVT in the visualized calf veins. If clinical symptoms are inconsistent or if there are persistent or worsening symptoms, further imaging (possibly involving the iliac veins) may be warranted. 2. Small left Baker's cyst Electronically Signed   By: DLucrezia EuropeM.D.   On: 03/22/2019 13:23        Discharge Exam: Vitals:   03/25/19 0550 03/25/19 1407  BP: 124/82 115/82  Pulse: 89 64  Resp: 20 18  Temp: 98.1 F (36.7 C) 98 F (36.7 C)  SpO2: 93% 100%   Vitals:   03/24/19 1528 03/24/19 2111 03/25/19 0550 03/25/19 1407  BP: (!)  145/71 (!) 153/75 124/82 115/82  Pulse: 62 66 89 64  Resp:  _0 Temp:  98.3 F (36.8 C) 98.1 F (36.7 C) 98 F (36.7 C)  TempSrc:  Oral Oral Oral  SpO2:  99% 93% 100%  Weight:      Height:        General: Pt is alert, awake, not in acute distress Cardiovascular: RRR, S1/S2 +, no rubs, no gallops Respiratory: CTA bilaterally, no wheezing, no rhonchi Abdominal: Soft, NT, ND, bowel sounds + Extremities: 1+ LE edema, no cyanosis   The results of significant diagnostics from this hospitalization (including imaging, microbiology, ancillary and laboratory) are listed below for reference.    Significant Diagnostic Studies: Ct Head Wo Contrast  Result Date: 03/22/2019 CLINICAL DATA:  Fall in the bathtub yesterday, loss of consciousness with head injury. Headache and neck pain. EXAM: CT HEAD WITHOUT CONTRAST CT CERVICAL SPINE WITHOUT CONTRAST TECHNIQUE: Multidetector CT imaging of the head and cervical spine was performed following the standard protocol without intravenous contrast. Multiplanar CT image reconstructions of the cervical spine were also generated. COMPARISON:  Head CT 01/27/2014 FINDINGS: CT HEAD FINDINGS Brain: No intracranial hemorrhage, mass effect, or midline shift. No hydrocephalus. The basilar cisterns are patent. No evidence of territorial infarct or acute ischemia. No extra-axial or intracranial fluid collection. Vascular: No hyperdense vessel or unexpected calcification. Skull: No fracture or focal lesion. Sinuses/Orbits: Paranasal sinuses and mastoid air cells are clear. The visualized orbits are unremarkable. Other: None. CT CERVICAL SPINE FINDINGS Alignment: Straightening of normal lordosis. No traumatic subluxation. Grade 1 anterolisthesis of C2 on C3 appears degenerative. Skull base and vertebrae: No acute fracture. Vertebral body heights are maintained. The dens and skull base are intact. Soft tissues and spinal canal: No prevertebral fluid or swelling. No visible  canal hematoma. Disc levels: Diffuse disc space narrowing and endplate spurring. Multilevel facet hypertrophy. Degenerative changes colic is spinal canal stenosis the level of C4-C5, C5-C6, and C6-C7. Upper chest: Assessed on concurrent chest CT. Other: None. IMPRESSION: 1. Unremarkable noncontrast head CT. 2. Multilevel degenerative change throughout the cervical spine without acute fracture or subluxation. Electronically Signed   By: Keith Rake M.D.   On: 03/22/2019 03:53   Ct Chest Wo Contrast  Result Date: 03/22/2019 CLINICAL DATA:  Fall in the bathtub yesterday. Upper back and left chest pain. EXAM: CT CHEST WITHOUT CONTRAST CT ABDOMEN, AND PELVIS WITH CONTRAST TECHNIQUE: Multidetector CT imaging of the chest was performed following stone protocol without IV contrast. Multidetector CT imaging of the abdomen and pelvis was performed following the standard protocol during bolus administration of intravenous contrast. CONTRAST:  194m OMNIPAQUE IOHEXOL 300 MG/ML  SOLN COMPARISON:  Chest CT 10/04/2017 FINDINGS: CT CHEST FINDINGS Cardiovascular: Limited assessment for vascular injury given lack of IV contrast. Aortic atherosclerosis. No periaortic stranding. Heart is normal in size. No pericardial effusion. Mediastinum/Nodes: No mediastinal hemorrhage or hematoma. No pneumomediastinum. The esophagus is decompressed. No visualized thyroid nodule. Lungs/Pleura: No pneumothorax. No pulmonary contusion. No pleural fluid. Paramediastinal right middle lobe pulmonary nodule measures 18 x 12 x 20 mm (volume = 2.300 cm^3), previously 16 x 10 x 21 mm (volume = 1.8 cm^3). No new pulmonary nodule. Trachea and mainstem bronchi are patent. Musculoskeletal: No acute fracture of the ribs, sternum, thoracic spine, included clavicles or shoulder girdles. No confluent body wall contusion. CT ABDOMEN PELVIS FINDINGS Hepatobiliary:  No hepatic injury or perihepatic hematoma. Gallbladder is unremarkable. Pancreas: No evidence of  injury. No ductal dilatation or inflammation. Spleen: No splenic injury or perisplenic hematoma. Adrenals/Urinary Tract: No adrenal hemorrhage or renal injury identified. Bladder is decompressed but otherwise unremarkable. Stomach/Bowel: Evidence of bowel injury. No bowel wall thickening or inflammation. No free air or free fluid. Normal appendix incidentally noted. Vascular/Lymphatic: No vascular injury. Abdominal aorta and IVC are intact. No retroperitoneal fluid. No adenopathy. Reproductive: Status post hysterectomy. No adnexal masses. Other: No confluent body wall contusion, portions of the right lateral abdominal wall partially excluded from the field of view. No free air or free fluid. Tiny fat containing umbilical hernia. Musculoskeletal: There are no acute or suspicious osseous abnormalities. No fracture of the lumbar spine or pelvis. IMPRESSION: 1. No evidence of acute traumatic injury to the chest, abdomen, or pelvis. 2. Known right middle lobe pulmonary nodule slightly larger than in 2019 exam. Recommend continued clinical radiologic follow-up. Aortic Atherosclerosis (ICD10-I70.0). Electronically Signed   By: Keith Rake M.D.   On: 03/22/2019 04:07   Ct Cervical Spine Wo Contrast  Result Date: 03/22/2019 CLINICAL DATA:  Fall in the bathtub yesterday, loss of consciousness with head injury. Headache and neck pain. EXAM: CT HEAD WITHOUT CONTRAST CT CERVICAL SPINE WITHOUT CONTRAST TECHNIQUE: Multidetector CT imaging of the head and cervical spine was performed following the standard protocol without intravenous contrast. Multiplanar CT image reconstructions of the cervical spine were also generated. COMPARISON:  Head CT 01/27/2014 FINDINGS: CT HEAD FINDINGS Brain: No intracranial hemorrhage, mass effect, or midline shift. No hydrocephalus. The basilar cisterns are patent. No evidence of territorial infarct or acute ischemia. No extra-axial or intracranial fluid collection. Vascular: No hyperdense  vessel or unexpected calcification. Skull: No fracture or focal lesion. Sinuses/Orbits: Paranasal sinuses and mastoid air cells are clear. The visualized orbits are unremarkable. Other: None. CT CERVICAL SPINE FINDINGS Alignment: Straightening of normal lordosis. No traumatic subluxation. Grade 1 anterolisthesis of C2 on C3 appears degenerative. Skull base and vertebrae: No acute fracture. Vertebral body heights are maintained. The dens and skull base are intact. Soft tissues and spinal canal: No prevertebral fluid or swelling. No visible canal hematoma. Disc levels: Diffuse disc space narrowing and endplate spurring. Multilevel facet hypertrophy. Degenerative changes colic is spinal canal stenosis the level of C4-C5, C5-C6, and C6-C7. Upper chest: Assessed on concurrent chest CT. Other: None. IMPRESSION: 1. Unremarkable noncontrast head CT. 2. Multilevel degenerative change throughout the cervical spine without acute fracture or subluxation. Electronically Signed   By: Keith Rake M.D.   On: 03/22/2019 03:53   Ct Abdomen Pelvis W Contrast  Result Date: 03/22/2019 CLINICAL DATA:  Fall in the bathtub yesterday. Upper back and left chest pain. EXAM: CT CHEST WITHOUT CONTRAST CT ABDOMEN, AND PELVIS WITH CONTRAST TECHNIQUE: Multidetector CT imaging of the chest was performed following stone protocol without IV contrast. Multidetector CT imaging of the abdomen and pelvis was performed following the standard protocol during bolus administration of intravenous contrast. CONTRAST:  175m OMNIPAQUE IOHEXOL 300 MG/ML  SOLN COMPARISON:  Chest CT 10/04/2017 FINDINGS: CT CHEST FINDINGS Cardiovascular: Limited assessment for vascular injury given lack of IV contrast. Aortic atherosclerosis. No periaortic stranding. Heart is normal in size. No pericardial effusion. Mediastinum/Nodes: No mediastinal hemorrhage or hematoma. No pneumomediastinum. The esophagus is decompressed. No visualized thyroid nodule. Lungs/Pleura: No  pneumothorax. No pulmonary contusion. No pleural fluid. Paramediastinal right middle lobe pulmonary nodule measures 18 x 12 x 20 mm (volume = 2.300 cm^3), previously 16  x 10 x 21 mm (volume = 1.8 cm^3). No new pulmonary nodule. Trachea and mainstem bronchi are patent. Musculoskeletal: No acute fracture of the ribs, sternum, thoracic spine, included clavicles or shoulder girdles. No confluent body wall contusion. CT ABDOMEN PELVIS FINDINGS Hepatobiliary: No hepatic injury or perihepatic hematoma. Gallbladder is unremarkable. Pancreas: No evidence of injury. No ductal dilatation or inflammation. Spleen: No splenic injury or perisplenic hematoma. Adrenals/Urinary Tract: No adrenal hemorrhage or renal injury identified. Bladder is decompressed but otherwise unremarkable. Stomach/Bowel: Evidence of bowel injury. No bowel wall thickening or inflammation. No free air or free fluid. Normal appendix incidentally noted. Vascular/Lymphatic: No vascular injury. Abdominal aorta and IVC are intact. No retroperitoneal fluid. No adenopathy. Reproductive: Status post hysterectomy. No adnexal masses. Other: No confluent body wall contusion, portions of the right lateral abdominal wall partially excluded from the field of view. No free air or free fluid. Tiny fat containing umbilical hernia. Musculoskeletal: There are no acute or suspicious osseous abnormalities. No fracture of the lumbar spine or pelvis. IMPRESSION: 1. No evidence of acute traumatic injury to the chest, abdomen, or pelvis. 2. Known right middle lobe pulmonary nodule slightly larger than in 2019 exam. Recommend continued clinical radiologic follow-up. Aortic Atherosclerosis (ICD10-I70.0). Electronically Signed   By: Keith Rake M.D.   On: 03/22/2019 04:07   US Venous Img Lower Bilateral  Result Date: 03/22/2019 CLINICAL DATA:  Pain, left greater than right EXAM: BILATERAL LOWER EXTREMITY VENOUS DOPPLER ULTRASOUND TECHNIQUE: Gray-scale sonography with  compression, as well as color and duplex ultrasound, were performed to evaluate the deep venous system from the level of the common femoral vein through the popliteal and proximal calf veins. COMPARISON:  02/05/2003 FINDINGS: Normal compressibility of the common femoral, superficial femoral, and popliteal veins, as well as the proximal calf veins. No filling defects to suggest DVT on grayscale or color Doppler imaging. Doppler waveforms show normal direction of venous flow, normal respiratory phasicity and response to augmentation. 3.6 x 3.4 x 1 cm fluid collection in the left medial posterior popliteal fossa. IMPRESSION: 1. No femoropopliteal and no calf DVT in the visualized calf veins. If clinical symptoms are inconsistent or if there are persistent or worsening symptoms, further imaging (possibly involving the iliac veins) may be warranted. 2. Small left Baker's cyst Electronically Signed   By: Lucrezia Europe M.D.   On: 03/22/2019 13:23     Microbiology: Recent Results (from the past 240 hour(s))  Urine culture     Status: Abnormal   Collection Time: 03/22/19  3:10 AM   Specimen: Urine, Clean Catch  Result Value Ref Range Status   Specimen Description   Final    URINE, CLEAN CATCH Performed at [facility], [address] [place], [place] [address]    Special Requests   Final    Normal Performed at [facility], [address], [place], [place] [address]    Culture MULTIPLE SPECIES PRESENT, SUGGEST RECOLLECTION (A)  Final   Report Status 03/23/2019 FINAL  Final  SARS Coronavirus 2 [facility] order, Performed in [facility] hospital lab) Nasopharyngeal Nasopharyngeal Swab     Status: None   Collection Time: 03/22/19  6:02 AM   Specimen: Nasopharyngeal Swab  Result Value Ref Range Status   SARS Coronavirus 2 NEGATIVE NEGATIVE Final    Comment: (NOTE) If result is NEGATIVE SARS-CoV-2 target nucleic acids are NOT DETECTED. The SARS-CoV-2 RNA is generally detectable in upper and lower   respiratory specimens during the acute phase of infection. The lowest  concentration of SARS-CoV-2 viral copies this assay can detect is 250  copies / mL. A negative result does not preclude SARS-CoV-2 infection  and should not be used as the sole basis for treatment or other  patient management decisions.  A negative result may occur with  improper specimen collection / handling, submission of specimen other  than nasopharyngeal swab, presence of viral mutation(s) within the  areas targeted by this assay, and inadequate number of viral copies  (<250 copies / mL). A negative result must be combined with clinical  observations, patient history, and epidemiological information. If result is POSITIVE SARS-CoV-2 target nucleic acids are DETECTED. The SARS-CoV-2 RNA is generally detectable in upper and lower  respiratory specimens dur ing the acute phase of infection.  Positive  results are indicative of active infection with SARS-CoV-2.  Clinical  correlation with patient history and other diagnostic information is  necessary to determine patient infection status.  Positive results do  not rule out bacterial infection or co-infection with other viruses. If result is PRESUMPTIVE POSTIVE SARS-CoV-2 nucleic acids MAY BE PRESENT.   A presumptive positive result was obtained on the submitted specimen  and confirmed on repeat testing.  While 2019 novel coronavirus  (SARS-CoV-2) nucleic acids may be present in the submitted sample  additional confirmatory testing may be necessary for epidemiological  and / or clinical management purposes  to differentiate between  SARS-CoV-2 and other Sarbecovirus currently known to infect humans.  If clinically indicated additional testing with an alternate test  methodology [phone]) is advised. The SARS-CoV-2 RNA is generally  detectable in upper and lower respiratory sp ecimens during the acute  phase of infection. The expected result is Negative. Fact  Sheet for Patients:  StrictlyIdeas.no Fact Sheet for Healthcare Providers: BankingDealers.co.za This test is not yet approved or cleared by the [place] FDA and has been authorized for detection and/or diagnosis of SARS-CoV-2 by FDA under an Emergency Use Authorization (EUA).  This EUA will remain in effect (meaning this test can be used) for the duration of the COVID-19 declaration under Section 564(b)(1) of the Act, 21 U.S.C. section 360bbb-3(b)(1), unless the authorization is terminated or revoked sooner. Performed at [facility], [address], [place], [place] [address]      Labs: Basic Metabolic Panel: Recent Labs  Lab 03/22/19 0130 03/22/19 1034 03/23/19 0649 03/24/19 0447 03/25/19 0422  NA 135  --  138 138 142  K 2.6*  --  3.4* 3.2* 4.0  CL 95*  --  102 102 109  CO2 28  --  _0 GLUCOSE 149*  --  90 99 90  BUN 14  --  8 5* 6  CREATININE 0.83  --  0.58 0.51 0.59  CALCIUM 8.5*  --  8.6* 8.7* 8.4*  MG  --  1.7  --  2.0  --   PHOS  --  3.7  --   --   --    Liver Function Tests: Recent Labs  Lab 03/22/19 0130 03/23/19 0649 03/24/19 0447 03/25/19 0422  AST 148* 110* 111* 103*  ALT 34 34 59* 82*  ALKPHOS 77 66 79 78  BILITOT 0.4 0.5 0.4 0.5  PROT 7.2 6.7 6.6 6.0*  ALBUMIN 3.7 3.4* 3.3* 3.0*   No results for input(s): LIPASE, AMYLASE in the last 168 hours. No results for input(s): AMMONIA in the last 168 hours. CBC: Recent Labs  Lab 03/22/19 0130 03/22/19 0614  WBC 7.9  --   NEUTROABS  5.9  --   HGB 12.2  --   HCT 37.4 33.8*  MCV 94.7  --   PLT 196  --    Cardiac Enzymes: Recent Labs  Lab 03/22/19 0130 03/23/19 0649 03/24/19 0447 03/25/19 0422 03/25/19 1312  CKTOTAL 8,109* 3,630* 2,449* 1,586* 1,525*   BNP: Invalid input(s): POCBNP CBG: Recent Labs  Lab 03/22/19 0029 03/22/19 0300 03/24/19 2123 03/25/19 0751  GLUCAP 187* 109* 98 102*    Time coordinating discharge:  36  minutes  Signed:  Orson Eva, DO [facility] Pager: [phone] 03/25/2019, 3:50 PM

## 2019-03-25 LAB — COMPREHENSIVE METABOLIC PANEL
ALT: 82 U/L — ABNORMAL HIGH (ref 0–44)
AST: 103 U/L — ABNORMAL HIGH (ref 15–41)
Albumin: 3 g/dL — ABNORMAL LOW (ref 3.5–5.0)
Alkaline Phosphatase: 78 U/L (ref 38–126)
Anion gap: 8 (ref 5–15)
BUN: 6 mg/dL (ref 6–20)
CO2: 25 mmol/L (ref 22–32)
Calcium: 8.4 mg/dL — ABNORMAL LOW (ref 8.9–10.3)
Chloride: 109 mmol/L (ref 98–111)
Creatinine, Ser: 0.59 mg/dL (ref 0.44–1.00)
GFR calc Af Amer: >60 mL/min (ref >60)
GFR calc non Af Amer: >60 mL/min (ref >60)
Glucose, Bld: 90 mg/dL (ref 70–99)
Potassium: 4 mmol/L (ref 3.5–5.1)
Sodium: 142 mmol/L (ref 135–145)
Total Bilirubin: 0.5 mg/dL (ref 0.3–1.2)
Total Protein: 6 g/dL — ABNORMAL LOW (ref 6.5–8.1)

## 2019-03-25 LAB — CK
Total CK: 1586 U/L — ABNORMAL HIGH (ref 38–234)
Total CK: 1525 U/L — ABNORMAL HIGH (ref 38–234)

## 2019-03-25 LAB — GLUCOSE, CAPILLARY: Glucose-Capillary: 102 mg/dL — ABNORMAL HIGH (ref 70–99)

## 2019-03-25 MED ORDER — ATORVASTATIN CALCIUM 10 MG PO TABS: 10.0000 mg | ORAL_TABLET | Freq: Every day | ORAL | 0 refills | Status: DC

## 2019-03-25 NOTE — Progress Notes (Signed)
Physical Therapy Treatment Patient Details Name: [name] MRN: [id number] DOB: 1961-02-19 Today's Date: 03/25/2019    History of Present Illness Patient is a 58 year old female admitted to ED 03/22/2019 after being found in bath tub by neighbor, unable to get up. Had been in bath tub for 36 hours. Admission diagnosis - rhabdomyolysis and acute metabolic encephalopathy. PMH: anxiety, DM Type 2, HTN, hyperglycemia, IBS, lung modules, mental retardation, myocardial infarction, migraines, sleep apnea, obesity, chronic emboli.    PT Comments    Patient demonstrates increased endurance/distance for ambulation without loss of balance on level, inclined, and declined surfaces, had to take a sitting rest break due to fatigue before making it back to room,  And on room air with SpO2 at 98%.  Patient tolerated sitting up at bedside after therapy.  Patient will benefit from continued physical therapy in hospital and recommended venue below to increase strength, balance, endurance for safe ADLs and gait.   Follow Up Recommendations  Outpatient PT;Supervision - Intermittent     Equipment Recommendations  None recommended by PT    Recommendations for Other Services       Precautions / Restrictions Precautions Precautions: Fall Precaution Comments: history of falls Restrictions Weight Bearing Restrictions: No    Mobility  Bed Mobility Overal bed mobility: Modified Independent             General bed mobility comments: increased time  Transfers Overall transfer level: Modified independent               General transfer comment: increased time, slightly labored movement  Ambulation/Gait Ambulation/Gait assistance: Supervision;Modified independent (Device/Increase time) Gait Distance (Feet): 225 Feet Assistive device: Straight cane Gait Pattern/deviations: Step-through pattern;Decreased step length - left;Decreased step length - right;Decreased stride length Gait velocity:  slightly decreased   General Gait Details: increased endurance/distance for ambulation with slightly labored cadence, no loss of balance, had to take a sitting rest break before ambulating back to room, on room air with SpO2 at 98%   Stairs             Wheelchair Mobility    Modified Rankin (Stroke Patients Only)       Balance Overall balance assessment: Needs assistance Sitting-balance support: Feet supported;No upper extremity supported Sitting balance-Leahy Scale: Good Sitting balance - Comments: seated at bedside   Standing balance support: During functional activity;No upper extremity supported Standing balance-Leahy Scale: Fair Standing balance comment: fair/good using SPC                            Cognition Arousal/Alertness: Awake/alert Behavior During Therapy: WFL for tasks assessed/performed Overall Cognitive Status: Within Functional Limits for tasks assessed                                        Exercises General Exercises - Lower Extremity Long Arc Quad: Seated;AROM;Strengthening;Both;10 reps Hip Flexion/Marching: Seated;AROM;Strengthening;Both;10 reps Toe Raises: Seated;AROM;Strengthening;Both;10 reps Heel Raises: Seated;AROM;Strengthening;Both;10 reps    General Comments        Pertinent Vitals/Pain Pain Assessment: No/denies pain    Home Living                      Prior Function            PT Goals (current goals can now be found in the care plan section) Acute  Rehab PT Goals Patient Stated Goal: To go home PT Goal Formulation: With patient Time For Goal Achievement: 04/06/19 Progress towards PT goals: Progressing toward goals    Frequency    Min 3X/week      PT Plan Current plan remains appropriate    Co-evaluation              AM-PAC PT "6 Clicks" Mobility   Outcome Measure  Help needed turning from your back to your side while in a flat bed without using bedrails?:  None Help needed moving from lying on your back to sitting on the side of a flat bed without using bedrails?: None Help needed moving to and from a bed to a chair (including a wheelchair)?: None Help needed standing up from a chair using your arms (e.g., wheelchair or bedside chair)?: None Help needed to walk in hospital room?: A Little Help needed climbing 3-5 steps with a railing? : A Little 6 Click Score: 22    End of Session   Activity Tolerance: Patient tolerated treatment well;Patient limited by fatigue Patient left: in bed;with call bell/phone within reach(seated at bedside) Nurse Communication: Mobility status PT Visit Diagnosis: Unsteadiness on feet (R26.81);Other abnormalities of gait and mobility (R26.89);History of falling (Z91.81);Muscle weakness (generalized) (M62.81)     Time: 1694-5038 PT Time Calculation (min) (ACUTE ONLY): 32 min  Charges:  $Gait Training: 8-22 mins $Therapeutic Exercise: 8-22 mins                     11:14 AM, 03/25/19 Lonell Grandchild, MPT Physical Therapist with [facility] [phone] office [phone] mobile phone

## 2019-03-25 NOTE — Progress Notes (Signed)
IV, telemetry removed. D/C instructions reviewed, verbalized understanding. Patient to be transported to private vehicle via wheelchair.

## 2019-03-26 ENCOUNTER — Other Ambulatory Visit: Payer: Self-pay

## 2019-03-26 ENCOUNTER — Telehealth: Payer: Self-pay | Admitting: Family Medicine

## 2019-03-26 LAB — HEPATITIS C ANTIBODY: HCV Ab: <0.1 s/co ratio (ref 0.0–0.9)

## 2019-03-26 LAB — HEPATITIS B SURFACE ANTIGEN: Hepatitis B Surface Ag: NEGATIVE

## 2019-03-26 NOTE — Telephone Encounter (Signed)
That's fine

## 2019-03-26 NOTE — Telephone Encounter (Signed)
Nurse's-patient recently discharged from the hospital. Please call patient, let them know that we are aware that they were discharged from the hospital. Please schedule them to follow-up with [place] within the next 7 days. Advised the patient to bring all of their medications with him to the visit. Please inquire if they are having any acute issues currently and documented accordingly.  Patient needs transitional follow-up  Patient was in the hospital due to a fall, had significant rhabdomyolysis, she will need a follow-up office visit preferably mid-to-late morning either this coming week or early in the week of October 5  Please coordinate with her social worker this appointment-if any problems are arising with the patient have them let us know so we could see her sooner

## 2019-03-26 NOTE — Patient Outreach (Signed)
[facility]) Care Management  [place]  03/26/2019   [name] [birth year] [id number]   Transition of Care Referral  Referral Date: 03/26/2019 Referral Source: [place] Date of Admission: 03/22/2019 Diagnosis: " traumatic rhabdomyolysis" Date of Discharge: 03/25/2019 Facility: [place] [facility] Medicare/ Medicaid   Outreach attempt #1 to patient. Spoke with patient. She complains of pain to her back and legs. She voices that this is ongoing and was occurring while she was in the hospital. He states that she can't take Tylenol as it makes her "sick." Patient recently discharged from the hospital on yesterday following admission. She reports that she slipped and fell while in the tub. She was unable to get out and her CNA called EMS.She states that she has fell "about 20 times this year" so far. She has DME in the home to assist her. RN CM provided education to patient regarding falls and safety prevention measures. She voices that she is in the process of getting a life alert device. Patient lives alone. She voices that she has CNA(Joe) that comes 3x/wk to assist her for a few hours in the afternoon. She also reports that she has a sister who helps her out and prepares meals for her.    Conditions: Per chart review, patient has PMH of diet controlled DM, obesity, mental retardation, HTN, smoker and sleep apnea.  Appointment: Patient voices that her case worker-[name] handles scheduling her appts and takes her to appts. Patient does not know when her appts are but states case worker has made them.  Medications: RN CM attempted to complete med review with patient via phone. However, due to patient's cognitive status unable to complete. RN CM will refer to [place] for med review assistance. Patient states that her CNA normally helps her with her meds.   Encounter Medications:  Outpatient Encounter Medications as of 03/26/2019   Medication Sig  . [START ON 04/01/2019] atorvastatin (LIPITOR) 10 MG tablet Take 1 tablet (10 mg total) by mouth daily. Start 04/01/19  . benztropine (COGENTIN) 1 MG tablet TAKE 1 TABLET BY MOUTH ONCE A DAY.  . calcium-vitamin D (OSCAL WITH D) 500-200 MG-UNIT per tablet Take 1 tablet by mouth 3 (three) times daily.    Marland Kitchen dicyclomine (BENTYL) 10 MG capsule TAKE 1 CAPSULE BY MOUTH FOUR TIMES DAILY BEFORE MEALS AND AT BEDTIME FOR LOOSE STOOLS.HOLD FOR CONSTIPATION  . hydrocortisone (ANUSOL-HC) 2.5 % rectal cream Place 1 application rectally 2 (two) times daily.  Marland Kitchen lamoTRIgine (LAMICTAL) 100 MG tablet TAKE 1 TABLET BY MOUTH TWICE A DAY. (Patient taking differently: Take 100 mg by mouth 2 (two) times daily. )  . lisinopril (ZESTRIL) 2.5 MG tablet TAKE (1) TABLET BY MOUTH EACH MORNING. (Patient taking differently: Take 2.5 mg by mouth every morning. TAKE (1) TABLET BY MOUTH EACH MORNING.)  . paliperidone (INVEGA) 9 MG 24 hr tablet TAKE 1 TABLET BY MOUTH ONCE A DAY. (Patient taking differently: Take 9 mg by mouth daily. )  . potassium chloride (K-DUR) 10 MEQ tablet Take 1 tablet (10 mEq total) by mouth 2 (two) times daily.  . propranolol (INDERAL) 10 MG tablet Take 1 tablet (10 mg total) by mouth 3 (three) times daily.  . RABEprazole (ACIPHEX) 20 MG tablet TAKE 1 TABLET BY MOUTH TWICE A DAY. (Patient taking differently: Take 20 mg by mouth 2 (two) times daily. )  . sertraline (ZOLOFT) 100 MG tablet TAKE 1 TABLET BY MOUTH ONCE A DAY. (Patient taking differently:  Take 100 mg by mouth daily. )  . torsemide (DEMADEX) 20 MG tablet TAKE (1) TABLET BY MOUTH EACH MORNING. (Patient taking differently: Take 20 mg by mouth daily. TAKE (1) TABLET BY MOUTH EACH MORNING.)  . traZODone (DESYREL) 100 MG tablet TAKE 2 TABLETS(200MG ) BY MOUTH AT BEDTIME. (Patient taking differently: Take 200 mg by mouth at bedtime. )   No facility-administered encounter medications on file as of 03/26/2019.     Functional Status:  In your  present state of health, do you have any difficulty performing the following activities: 03/26/2019 03/22/2019  Hearing? N N  Comment - -  Vision? N N  Difficulty concentrating or making decisions? Y N  Comment hx of mental retardation -  Walking or climbing stairs? Y Y  Comment - -  Dressing or bathing? N N  Doing errands, shopping? [place] and eating ? Y -  Comment - -  Using the Toilet? N -  Comment - -  In the past six months, have you accidently leaked urine? N -  Do you have problems with loss of bowel control? N -  Managing your Medications? Y -  Comment - -  Managing your Finances? Y -  [place] or managing your Housekeeping? Y -  Comment - -  Some recent data might be hidden    Fall/Depression Screening: Fall Risk  03/26/2019 02/27/2019 01/24/2019  Falls in the past year? 1 0 1  Comment - - late entry  Number falls in past yr: 1 0 1  Injury with Fall? 1 0 1  Risk for fall due to : History of fall(s);Medication side effect;Impaired balance/gait;Mental status change;Impaired mobility;Impaired vision Impaired mobility History of fall(s);Impaired balance/gait;Orthopedic patient  Follow up Education provided;Falls prevention discussed;Falls evaluation completed Falls prevention discussed Falls evaluation completed;Education provided;Falls prevention discussed   PHQ 2/9 Scores 03/26/2019 01/24/2019 08/19/2018 03/18/2018 09/26/2017 07/31/2016 07/31/2016  PHQ - 2 Score 1 2 0 0 2 1 1   PHQ- 9 Score - 5 - - 8 - -    Assessment:  THN CM Care Plan Problem One     Most Recent Value  Care Plan Problem One  Patient at risk for readmission due to co-morbidities.  Role Documenting the Problem One  Care Management Telephonic [place] for Problem One  Active  [facility] Long Term Goal   Patient will have no readmisison to hospital over the next 31 days.  THN Long Term Goal Start Date  03/26/19  Interventions for Problem One Long Term Goal  RN  CM educated patient on s/s of worsening condition and when to seek medical attention. RN CM confirmed that patient has MD follow up appts and all meds in the home.  THN CM Short Term Goal #1   Patient will complete all post discharge MD appts over the next 30 days.  THN CM Short Term Goal #1 Start Date  03/26/19  Interventions for Short Term Goal #1  RNCM confirmed with patient that she has MD appts scheduled and transportation to appts.    THN CM Care Plan Problem Two     Most Recent Value  Care Plan Problem Two  Patient at high risk for falls as evidenced by multiple falls reported this year.  Role Documenting the Problem Two  Care Management Telephonic [place] for Problem Two  Active  THN CM Short Term Goal #1   Patient will sustain no  falls in the home in the next 30 days.  THN CM Short Term Goal #1 Start Date  03/26/19  Interventions for Short Term Goal #2   RNCM completed falls assessment and provided education to patient regarding fall and safety prevention.  THN CM Short Term Goal #2   Patient will obtain life alert device to aide in safety in the home over the next 30 days.  THN CM Short Term Goal #2 Start Date  03/26/19  Interventions for Short Term Goal #2  RNCM discussed with patient benefits of device and safety measures to keep her safe in the home.      Plan:  RN CM will follow up with patient within two weeks. RN CM will send successful outreach letter to patient.  RN CM will send MD barriers letter to patient. RN CM will send [facility] pharmacy referral for possible med review and to assess med adherence.  Enzo Montgomery, RN,BSN,CCM [place] Management Telephonic Care Management Coordinator Direct Phone: [phone] Toll Free: [phone] Fax: [phone]

## 2019-03-26 NOTE — Telephone Encounter (Signed)
Patient already had in office visit scheduled with you 04/08/19 at 1:10- case manager aware. Is this ok?

## 2019-03-31 ENCOUNTER — Telehealth: Payer: Self-pay | Admitting: Family Medicine

## 2019-03-31 NOTE — Telephone Encounter (Signed)
There is no medication used for the circumstances to improve appetite. Sadly patient lives in a situation where she is dependent longer own cooking I would recommend trying to do the best she can at stocking her with supplies that allow her to fix simple food on a regular basis We will do a more detailed analysis of how she is doing when she comes for her office visit  If she continues to have falling spells she may have to be in assisted living rather than an apartment

## 2019-03-31 NOTE — Telephone Encounter (Signed)
[name] contacted and verbalized understanding.

## 2019-03-31 NOTE — Telephone Encounter (Signed)
Please advise. Thank you

## 2019-03-31 NOTE — Telephone Encounter (Signed)
[name] at [employer] is calling because she said patient is falling a lot and she thinks it is because she doesn't eat. She said the patient has no appetite but also said the patient is over weight and eats fine if she takes her to [employer] or fast food places but doesn't eat at home because she doesn't like the food.  Wanted to see if Dr. Nicki Reaper would prescribe something to increase her appetite.  Said patient gets dizzy and that is why she falls a lot. Patient has an appt next week to come in office.

## 2019-04-01 ENCOUNTER — Telehealth: Payer: Self-pay | Admitting: Pharmacist

## 2019-04-01 NOTE — Patient Outreach (Addendum)
[facility]) Care Management  [place]   04/01/2019  [name] [birth year] [id number]  Reason for referral: Medication Review, Medication Reconciliation Post Discharge  Referral source: [facility] RN Current insurance: [facility]  PMHx includes but not limited to:   Anxiety, back pain, type 2 diabetes, hypertension, GERD, hyperlipidemia, IBS, morbid obesity,  COPD, osteoarthritis of both knees, unspecified mental retardardation, and current tobacco use. Patient was recently admitted for rhabdomyolysis.  Outreach:  Successful telephone call with patient.  HIPAA identifiers verified.   Subjective:  Patient is a 58 year old female with the medical conditions mentioned above. Patient reported still not feeling well after being discharged because she continues to fall. She was hospitalized for rhabdomyolysis last week after she presented to the hospital stating she fell in the tub and was unable to walk afterwards.  Does the patient ever forget to take medication?  yes Does the patient have problems obtaining medications due to transportation?   no Does the patient have problems obtaining medications due to cost?  no    Objective: The ASCVD Risk score Mikey Bussing DC Jr., et al., 2013) failed to calculate for the following reasons:   The patient has a prior MI or stroke diagnosis  Lab Results  Component Value Date   CREATININE 0.59 03/25/2019   CREATININE 0.51 03/24/2019   CREATININE 0.58 03/23/2019    Lab Results  Component Value Date   HGBA1C 6.2 (H) 02/12/2019    Lipid Panel     Component Value Date/Time   CHOL 109 02/12/2019 1442   TRIG 89 02/12/2019 1442   HDL 53 02/12/2019 1442   CHOLHDL 2.1 02/12/2019 1442   CHOLHDL 2.9 07/20/2014 1214   VLDL 21 07/20/2014 1214   LDLCALC 38 02/12/2019 1442    BP Readings from Last 3 Encounters:  03/25/19 115/82  02/26/19 135/81  02/12/19 124/80    Allergies  Allergen Reactions  . Thorazine  [Chlorpromazine Hcl] Anaphylaxis  . Acetaminophen Other (See Comments)    Makes pt dizzy  . Aspirin Other (See Comments)    seizure  . Aspirin-Acetaminophen-Caffeine Other (See Comments)    seizure  . Nsaids Nausea And Vomiting  . Other     Acidic foods  . Penicillins Nausea And Vomiting    Has patient had a PCN reaction causing immediate rash, facial/tongue/throat swelling, SOB or lightheadedness with hypotension:Yes Has patient had a PCN reaction causing severe rash involving mucus membranes or skin necrosis:Yes Has patient had a PCN reaction that required hospitalization:Yes Has patient had a PCN reaction occurring within the last 10 years:No If all of the above answers are "NO", then may proceed with Cephalosporin use.   . Tomato Rash    Medications Reviewed Today    Reviewed by Elayne Guerin, [facility] (Pharmacist) on 04/01/19 at [place] List Status: <None>  Medication Order Taking? Sig Documenting Provider Last Dose Status Informant  atorvastatin (LIPITOR) 10 MG tablet [id number] Yes Take 1 tablet (10 mg total) by mouth daily. Start 04/01/19 Orson Eva, MD Taking Active   benztropine (COGENTIN) 1 MG tablet [id number] Yes TAKE 1 TABLET BY MOUTH ONCE A DAY. Cloria Spring, MD Taking Active Self  calcium-vitamin D (OSCAL WITH D) 500-200 MG-UNIT per tablet [id number] Yes Take 1 tablet by mouth 3 (three) times daily.   [provider] Taking Active Self  dicyclomine (BENTYL) 10 MG capsule [id number] Yes TAKE 1 CAPSULE BY MOUTH FOUR TIMES DAILY BEFORE MEALS AND AT BEDTIME  FOR LOOSE STOOLS.HOLD FOR CONSTIPATION Mahala Menghini, PA-C Taking Active Self  hydrocortisone (ANUSOL-HC) 2.5 % rectal cream [id number] No Place 1 application rectally 2 (two) times daily.  Patient not taking: Reported on 04/01/2019   Annitta Needs, NP Not Taking Active Self  lamoTRIgine (LAMICTAL) 100 MG tablet [id number] Yes TAKE 1 TABLET BY MOUTH TWICE A DAY.  Patient taking differently: Take 100 mg by mouth 2  (two) times daily.    Cloria Spring, MD Taking Active Self  lisinopril (ZESTRIL) 2.5 MG tablet [id number] Yes TAKE (1) TABLET BY MOUTH EACH MORNING.  Patient taking differently: Take 2.5 mg by mouth every morning. TAKE (1) TABLET BY MOUTH EACH MORNING.   Kathyrn Drown, MD Taking Active Self  paliperidone (INVEGA) 9 MG 24 hr tablet [id number] Yes TAKE 1 TABLET BY MOUTH ONCE A DAY.  Patient taking differently: Take 9 mg by mouth daily.    Cloria Spring, MD Taking Active Self  potassium chloride (K-DUR) 10 MEQ tablet [id number] Yes Take 1 tablet (10 mEq total) by mouth 2 (two) times daily. Kathyrn Drown, MD Taking Active Self  propranolol (INDERAL) 10 MG tablet [id number] Yes Take 1 tablet (10 mg total) by mouth 3 (three) times daily. Kathyrn Drown, MD Taking Active Self  RABEprazole (ACIPHEX) 20 MG tablet [id number] Yes TAKE 1 TABLET BY MOUTH TWICE A DAY.  Patient taking differently: Take 20 mg by mouth 2 (two) times daily.    Mahala Menghini, PA-C Taking Active Self  sertraline (ZOLOFT) 100 MG tablet [id number]  TAKE 1 TABLET BY MOUTH ONCE A DAY.  Patient taking differently: Take 100 mg by mouth daily.    Cloria Spring, MD  Active Self  torsemide (DEMADEX) 20 MG tablet [id number]  TAKE (1) TABLET BY MOUTH EACH MORNING.  Patient taking differently: Take 20 mg by mouth daily. TAKE (1) TABLET BY MOUTH EACH MORNING.   Kathyrn Drown, MD  Active Self  traZODone (DESYREL) 100 MG tablet [id number]  TAKE 2 TABLETS(200MG ) BY MOUTH AT BEDTIME.  Patient taking differently: Take 200 mg by mouth at bedtime.    Cloria Spring, MD  Active Self           ASSESSMENT: Reviewed patient's medications with both [facility] and [facility]  Date Discharged from Hospital: 03/25/2019 Date Medication Reconciliation Performed: 04/01/2019  Medications:  New at Discharge: . n/a  Adjustments at Discharge: . Atorvastatin 10 mg daily (told to hold until 1 week after discharge)  Discontinued at  Discharge:   Fluticasone Nasal Spray  Patient was recently discharged from hospital and all medications have been reviewed.  Drugs sorted by system:  Neurologic/Psychologic: Benztropine, Lamotrigine, Invega, Sertraline, Trazodone,   Cardiovascular:  Atorvastatin, Lisinopril, Propanolol, Torsemide,   Gastrointestinal: Dicyclomine, Rabeprazole,   Topical: Hydrocortisone Rectal Cream,   Vitamins/Minerals/Supplements: Calcium-Vitamin D, Potassium Chloride,   Miscellaneous:  Medication Review Findings:  . Adherence-patient usually gets all of her medications delivered to her in pill packs from [facility] in [place]. For some reason, she ended up getting all of her psch medications filled at [facility] in August for 90 day supplies.  o The patient said she is not able to manage her medications on her own and gets very confused with the bottles. o When asked who authorized medications to be filled at [facility] order, the patient said she did because [facility] kept calling her and was getting on her nerves about it.  She said  she regretted ever changing because she was/is so confused. o [facility] was called and asked to no longer fill any of the patient's medications through the mail order pharmacy because the patient is receiving her medications in pill packs from a local pharmacy and this is better for her.  Spoke back with the pharmacist at [facility]) and she said they will fill the patient's psych meds in the pill packs as soon as the claims will go through (most likely in November).    Unfortunately, patient will have the medications dispensed in the pill bottles and those in pill packs until November.  A call was placed to her social worker, [name] [name] to discuss but she did not answer so a message was left.   Until November:  The following medications will be in the pill packs from [facility] in [place]:  Dicyclomine, Lisinopril, Potassium Chloride,  Propranolol, Torsemide, Rabeprazole, and Atorvastatin  The following medications will be in pill bottles from [facility]: Sertraline, Lamotrigine, Benztropine, Invega and Trazodone.    Patient described still falling and having pain. Atorvastatin was dispensed in her pill pack.  It is unclear if patient is still taking Atorvastatin. Patient may not have the mental capacity to remove the atorvastatin pill from her pill pack.  If she is still taking atorvastatin, it may be exacerbating her symptoms and causing her to continue to fall.  Medication Adherence Findings: Adherence Review  []  Excellent (no doses missed/week)     []  Good (no more than 1 dose missed/week)     [x]  Partial (2-3 doses missed/week) []  Poor (>3 doses missed/week)  Patient with poor understanding of regimen and poor understanding of indications.    Medication Assistance Findings:  No medication assistance needs identified  Extra Help:  Already receiving Full Extra Help Low Income Subsidy  Plan: . Will contact Social IT consultant [name] regarding medication adherence.  . Will route note to PCP.  Marland Kitchen Will follow-up in 2 weeks. --if I do not hear back from Education officer, museum Await call back from Social Worker  Elayne Guerin, PharmD, [place] Clinical Pharmacist [phone]

## 2019-04-02 ENCOUNTER — Other Ambulatory Visit: Payer: Self-pay

## 2019-04-02 ENCOUNTER — Ambulatory Visit (INDEPENDENT_AMBULATORY_CARE_PROVIDER_SITE_OTHER): Payer: Medicare Other | Admitting: Gastroenterology

## 2019-04-02 ENCOUNTER — Other Ambulatory Visit: Payer: Self-pay | Admitting: Pharmacist

## 2019-04-02 ENCOUNTER — Encounter: Payer: Self-pay | Admitting: Gastroenterology

## 2019-04-02 VITALS — BP 132/80 | HR 98 | Temp 95.9°F | Ht 69.0 in | Wt 247.0 lb

## 2019-04-02 DIAGNOSIS — R1310 Dysphagia, unspecified: Secondary | ICD-10-CM | POA: Diagnosis not present

## 2019-04-02 DIAGNOSIS — K625 Hemorrhage of anus and rectum: Secondary | ICD-10-CM | POA: Diagnosis not present

## 2019-04-02 DIAGNOSIS — R197 Diarrhea, unspecified: Secondary | ICD-10-CM | POA: Diagnosis not present

## 2019-04-02 MED ORDER — HYDROCORTISONE (PERIANAL) 2.5 % EX CREA: 1.0000 "application " | TOPICAL_CREAM | Freq: Two times a day (BID) | CUTANEOUS | 1 refills | Status: DC

## 2019-04-02 NOTE — Assessment & Plan Note (Signed)
History of chronic diarrhea previously responding well to Bentyl, now with numerous loose stools. Recent exposure to antibiotics while inpatient. Will check stool studies now. As of note, patient is a difficult historian. Return in 3 months.

## 2019-04-02 NOTE — H&P (View-Only) (Signed)
Referring Provider: Kathyrn Drown, MD Primary Care Physician:  Kathyrn Drown, MD Primary GI: Dr. Gala Romney   Chief Complaint  Patient presents with  . Dysphagia    scheduled for EGD  . Rectal Bleeding    HPI:   [name] is a 58 y.o. female presenting today with a history of GERD, IBS-D, colonoscopy due in 2023 (history of adenomas). Refractory GERD to PPI BID and dysphagia noted at last visit. Historically with improvement s/p dilatation. Difficult historian. She is already scheduled for EGD/dilatation in Oct 2020. Recently inpatient after a fall with traumatic rhabdomyolysis.   IBS: soft/liquid stool. Taking Bentyl 4 times a day. States that yesterday she had over 20 loose stools. Unable to quantify amount. Recent exposure to antibiotics while inpatient after fall.   Dysphagia: solid food dysphagia, pill dysphagia persists. Continue with plans for EGD/dilatation in the future.   Rectal bleeding: intermittent this week. Some itching. Thinks the rectal cream helped some last time. Last colonoscopy 2018.     Past Medical History:  Diagnosis Date  . Anxiety   . Diabetes mellitus   . Diabetes mellitus, type II ([place])   . HTN (hypertension)   . Hyperglycemia   . IBS (irritable bowel syndrome)   . Lung nodules    right, followed by PCP, PET 11/2011  . Mental retardation   . MI (myocardial infarction) ([place])   . Migraines   . Sleep apnea     Past Surgical History:  Procedure Laterality Date  . ABDOMINAL HYSTERECTOMY    . COLONOSCOPY  11/2007   hyperplastic polyps, prior hx of adenomas   . COLONOSCOPY  05/2010   incomplete due to poor prep, hyperplastic rectal polyp  . COLONOSCOPY  05/05/2002   Dimunitive polyps in the rectum and left colon, cold    biopsied/removed.  Scattered few left-sided diverticula.  Regular colonic   mucosa appeared normal  . COLONOSCOPY N/A 05/28/2013   Rourk: mulitple tubular adenomas removed. next tcs 05/2016  . COLONOSCOPY WITH PROPOFOL N/A  09/07/2016   Dr. Gala Romney: For hyperplastic polyps removed. Next colonoscopy March 2023 given history of adenomatous colon polyps in the past.  . ESOPHAGOGASTRODUODENOSCOPY  08/2007   moderate sized hiatal hernia  . ESOPHAGOGASTRODUODENOSCOPY  05/2010   noncritical schatzki ring s/p 21F  . ESOPHAGOGASTRODUODENOSCOPY (EGD) WITH ESOPHAGEAL DILATION N/A 02/06/2013   OZD:GUYQIH esophagus-s/p dilation up to a 110 [place] size with [facility] dilators.  Hiatal hernia  . ESOPHAGOGASTRODUODENOSCOPY (EGD) WITH PROPOFOL N/A 09/07/2016   Dr. Gala Romney: Normal, status post empiric dilation of the esophagus for history of dysphagia  . EXTERNAL EAR SURGERY     bilateral  . FOOT SURGERY    . GLAUCOMA SURGERY    . MALONEY DILATION N/A 09/07/2016   Procedure: Venia Minks DILATION;  Surgeon: Daneil Dolin, MD;  [facility];  Service: Endoscopy;  Laterality: N/A;  . POLYPECTOMY  09/07/2016   Procedure: POLYPECTOMY;  Surgeon: Daneil Dolin, MD;  [facility];  Service: Endoscopy;;  sigmoid colon x4  . small bowel capsule  10/2007   normal    Current Outpatient Medications  Medication Sig Dispense Refill  . atorvastatin (LIPITOR) 10 MG tablet Take 1 tablet (10 mg total) by mouth daily. Start 04/01/19 30 tablet 0  . benztropine (COGENTIN) 1 MG tablet TAKE 1 TABLET BY MOUTH ONCE A DAY. 30 tablet 0  . calcium-vitamin D (OSCAL WITH D) 500-200 MG-UNIT per tablet Take 1 tablet by mouth 3 (  three) times daily.      Marland Kitchen dicyclomine (BENTYL) 10 MG capsule TAKE 1 CAPSULE BY MOUTH FOUR TIMES DAILY BEFORE MEALS AND AT BEDTIME FOR LOOSE STOOLS.HOLD FOR CONSTIPATION 120 capsule 5  . lamoTRIgine (LAMICTAL) 100 MG tablet TAKE 1 TABLET BY MOUTH TWICE A DAY. (Patient taking differently: Take 100 mg by mouth 2 (two) times daily. ) 60 tablet 0  . paliperidone (INVEGA) 9 MG 24 hr tablet TAKE 1 TABLET BY MOUTH ONCE A DAY. (Patient taking differently: Take 9 mg by mouth daily. ) 30 tablet 0  . potassium chloride (K-DUR) 10 MEQ tablet  Take 1 tablet (10 mEq total) by mouth 2 (two) times daily. 60 tablet 5  . propranolol (INDERAL) 10 MG tablet Take 1 tablet (10 mg total) by mouth 3 (three) times daily. 90 tablet 5  . RABEprazole (ACIPHEX) 20 MG tablet TAKE 1 TABLET BY MOUTH TWICE A DAY. (Patient taking differently: Take 20 mg by mouth 2 (two) times daily. ) 60 tablet 5  . sertraline (ZOLOFT) 100 MG tablet TAKE 1 TABLET BY MOUTH ONCE A DAY. (Patient taking differently: Take 100 mg by mouth daily. ) 30 tablet 0  . traZODone (DESYREL) 100 MG tablet TAKE 2 TABLETS(200MG ) BY MOUTH AT BEDTIME. (Patient taking differently: Take 200 mg by mouth at bedtime. ) 60 tablet 0  . hydrocortisone (ANUSOL-HC) 2.5 % rectal cream Place 1 application rectally 2 (two) times daily. 30 g 1  . lisinopril (ZESTRIL) 2.5 MG tablet TAKE (1) TABLET BY MOUTH EACH MORNING. (Patient not taking: Reported on 04/02/2019) 30 tablet 5  . torsemide (DEMADEX) 20 MG tablet TAKE (1) TABLET BY MOUTH EACH MORNING. (Patient not taking: Reported on 04/02/2019) 30 tablet 5   No current facility-administered medications for this visit.     Allergies as of 04/02/2019 - Review Complete 04/02/2019  Allergen Reaction Noted  . Thorazine [chlorpromazine hcl] Anaphylaxis 10/20/2010  . Acetaminophen Other (See Comments)   . Aspirin Other (See Comments)   . Aspirin-acetaminophen-caffeine Other (See Comments)   . Nsaids Nausea And Vomiting   . Other  03/22/2019  . Penicillins Nausea And Vomiting   . Tomato Rash 01/17/2012    Family History  Problem Relation Age of Onset  . Stroke Mother   . Heart attack Father   . Schizophrenia Other   . Drug abuse Other   . Alcohol abuse Other   . Colon cancer Other        aunt  . Obesity Other   . COPD Other   . GER disease Other   . Diabetes type II Other   . Anxiety disorder Other   . Depression Other   . Depression Sister   . Schizophrenia Sister   . Liver disease Neg Hx   . Inflammatory bowel disease Neg Hx     Social  History   Socioeconomic History  . Marital status: Divorced    Spouse name: Not on file  . Number of children: Not on file  . Years of education: Not on file  . Highest education level: High school graduate  Occupational History  . Occupation: disabled    Fish farm manager: UNEMPLOYED  Social Needs  . Financial resource strain: Not hard at all  . Food insecurity    Worry: Never true    Inability: Never true  . Transportation needs    Medical: No    Non-medical: No  Tobacco Use  . Smoking status: Current Every Day Smoker    Packs/day: 0.25  Years: 40.00    Pack years: 10.00    Types: Cigarettes  . Smokeless tobacco: Never Used  Substance and Sexual Activity  . Alcohol use: No  . Drug use: No  . Sexual activity: Yes    Partners: Male    Birth control/protection: None    Comment: boyfriend  Lifestyle  . Physical activity    Days per week: 7 days    Minutes per session: 20 min  . Stress: Only a little  Relationships  . Social Herbalist on phone: Not on file    Gets together: Not on file    Attends religious service: Not on file    Active member of club or organization: Not on file    Attends meetings of clubs or organizations: Not on file    Relationship status: Not on file  Other Topics Concern  . Not on file  Social History Narrative  . Not on file    Review of Systems: Gen: Denies fever, chills, anorexia. Denies fatigue, weakness, weight loss.  CV: Denies chest pain, palpitations, syncope, peripheral edema, and claudication. Resp: Denies dyspnea at rest, cough, wheezing, coughing up blood, and pleurisy. GI: see HPI Derm: Denies rash, itching, dry skin Psych: Denies depression, anxiety, memory loss, confusion. No homicidal or suicidal ideation.  Heme: see HPI  Physical Exam: BP 132/80   Pulse 98   Temp (!) 95.9 F (35.5 C) (Temporal)   Ht 5\' 9"  (1.753 m)   Wt 247 lb (112 kg)   BMI 36.48 kg/m  General:   Alert and oriented. No distress noted.  Pleasant and cooperative.  Head:  Normocephalic and atraumatic. Eyes:  Conjuctiva clear without scleral icterus. Abdomen:  +BS, soft, non-tender and non-distended. No rebound or guarding. No HSM or masses noted. Msk:  Symmetrical without gross deformities. Normal posture. Extremities:  With pedal edema Neurologic:  Alert and  oriented x4 Psych:  Alert and cooperative. Normal mood and affect.

## 2019-04-02 NOTE — Patient Instructions (Signed)
Please complete the stool sample as soon as possible.  I have sent in cream for your rectum to take twice a day for 7 days.  Keep plans for upper endoscopy coming up with Dr. Gala Romney.  We will see you in 3 months!  I enjoyed seeing you again today! As you know, I value our relationship and want to provide genuine, compassionate, and quality care. I welcome your feedback. If you receive a survey regarding your visit,  I greatly appreciate you taking time to fill this out. See you next time!  Annitta Needs, PhD, ANP-BC [facility]

## 2019-04-02 NOTE — Assessment & Plan Note (Signed)
Low-volume in setting of frequent stools. Colonoscopy in 2018. Anusol course sent to pharmacy. Consider hemorrhoid banding in future.

## 2019-04-02 NOTE — Progress Notes (Signed)
Referring Provider: Kathyrn Drown, MD Primary Care Physician:  Kathyrn Drown, MD Primary GI: Dr. Gala Romney   Chief Complaint  Patient presents with  . Dysphagia    scheduled for EGD  . Rectal Bleeding    HPI:   [name] is a 58 y.o. female presenting today with a history of GERD, IBS-D, colonoscopy due in 2023 (history of adenomas). Refractory GERD to PPI BID and dysphagia noted at last visit. Historically with improvement s/p dilatation. Difficult historian. She is already scheduled for EGD/dilatation in Oct 2020. Recently inpatient after a fall with traumatic rhabdomyolysis.   IBS: soft/liquid stool. Taking Bentyl 4 times a day. States that yesterday she had over 20 loose stools. Unable to quantify amount. Recent exposure to antibiotics while inpatient after fall.   Dysphagia: solid food dysphagia, pill dysphagia persists. Continue with plans for EGD/dilatation in the future.   Rectal bleeding: intermittent this week. Some itching. Thinks the rectal cream helped some last time. Last colonoscopy 2018.     Past Medical History:  Diagnosis Date  . Anxiety   . Diabetes mellitus   . Diabetes mellitus, type II ([place])   . HTN (hypertension)   . Hyperglycemia   . IBS (irritable bowel syndrome)   . Lung nodules    right, followed by PCP, PET 11/2011  . Mental retardation   . MI (myocardial infarction) ([place])   . Migraines   . Sleep apnea     Past Surgical History:  Procedure Laterality Date  . ABDOMINAL HYSTERECTOMY    . COLONOSCOPY  11/2007   hyperplastic polyps, prior hx of adenomas   . COLONOSCOPY  05/2010   incomplete due to poor prep, hyperplastic rectal polyp  . COLONOSCOPY  05/05/2002   Dimunitive polyps in the rectum and left colon, cold    biopsied/removed.  Scattered few left-sided diverticula.  Regular colonic   mucosa appeared normal  . COLONOSCOPY N/A 05/28/2013   Rourk: mulitple tubular adenomas removed. next tcs 05/2016  . COLONOSCOPY WITH PROPOFOL N/A  09/07/2016   Dr. Gala Romney: For hyperplastic polyps removed. Next colonoscopy March 2023 given history of adenomatous colon polyps in the past.  . ESOPHAGOGASTRODUODENOSCOPY  08/2007   moderate sized hiatal hernia  . ESOPHAGOGASTRODUODENOSCOPY  05/2010   noncritical schatzki ring s/p 14F  . ESOPHAGOGASTRODUODENOSCOPY (EGD) WITH ESOPHAGEAL DILATION N/A 02/06/2013   JQB:HALPFX esophagus-s/p dilation up to a 66 [place] size with [facility] dilators.  Hiatal hernia  . ESOPHAGOGASTRODUODENOSCOPY (EGD) WITH PROPOFOL N/A 09/07/2016   Dr. Gala Romney: Normal, status post empiric dilation of the esophagus for history of dysphagia  . EXTERNAL EAR SURGERY     bilateral  . FOOT SURGERY    . GLAUCOMA SURGERY    . MALONEY DILATION N/A 09/07/2016   Procedure: Venia Minks DILATION;  Surgeon: Daneil Dolin, MD;  [facility];  Service: Endoscopy;  Laterality: N/A;  . POLYPECTOMY  09/07/2016   Procedure: POLYPECTOMY;  Surgeon: Daneil Dolin, MD;  [facility];  Service: Endoscopy;;  sigmoid colon x4  . small bowel capsule  10/2007   normal    Current Outpatient Medications  Medication Sig Dispense Refill  . atorvastatin (LIPITOR) 10 MG tablet Take 1 tablet (10 mg total) by mouth daily. Start 04/01/19 30 tablet 0  . benztropine (COGENTIN) 1 MG tablet TAKE 1 TABLET BY MOUTH ONCE A DAY. 30 tablet 0  . calcium-vitamin D (OSCAL WITH D) 500-200 MG-UNIT per tablet Take 1 tablet by mouth 3 (  three) times daily.      Marland Kitchen dicyclomine (BENTYL) 10 MG capsule TAKE 1 CAPSULE BY MOUTH FOUR TIMES DAILY BEFORE MEALS AND AT BEDTIME FOR LOOSE STOOLS.HOLD FOR CONSTIPATION 120 capsule 5  . lamoTRIgine (LAMICTAL) 100 MG tablet TAKE 1 TABLET BY MOUTH TWICE A DAY. (Patient taking differently: Take 100 mg by mouth 2 (two) times daily. ) 60 tablet 0  . paliperidone (INVEGA) 9 MG 24 hr tablet TAKE 1 TABLET BY MOUTH ONCE A DAY. (Patient taking differently: Take 9 mg by mouth daily. ) 30 tablet 0  . potassium chloride (K-DUR) 10 MEQ tablet  Take 1 tablet (10 mEq total) by mouth 2 (two) times daily. 60 tablet 5  . propranolol (INDERAL) 10 MG tablet Take 1 tablet (10 mg total) by mouth 3 (three) times daily. 90 tablet 5  . RABEprazole (ACIPHEX) 20 MG tablet TAKE 1 TABLET BY MOUTH TWICE A DAY. (Patient taking differently: Take 20 mg by mouth 2 (two) times daily. ) 60 tablet 5  . sertraline (ZOLOFT) 100 MG tablet TAKE 1 TABLET BY MOUTH ONCE A DAY. (Patient taking differently: Take 100 mg by mouth daily. ) 30 tablet 0  . traZODone (DESYREL) 100 MG tablet TAKE 2 TABLETS(200MG ) BY MOUTH AT BEDTIME. (Patient taking differently: Take 200 mg by mouth at bedtime. ) 60 tablet 0  . hydrocortisone (ANUSOL-HC) 2.5 % rectal cream Place 1 application rectally 2 (two) times daily. 30 g 1  . lisinopril (ZESTRIL) 2.5 MG tablet TAKE (1) TABLET BY MOUTH EACH MORNING. (Patient not taking: Reported on 04/02/2019) 30 tablet 5  . torsemide (DEMADEX) 20 MG tablet TAKE (1) TABLET BY MOUTH EACH MORNING. (Patient not taking: Reported on 04/02/2019) 30 tablet 5   No current facility-administered medications for this visit.     Allergies as of 04/02/2019 - Review Complete 04/02/2019  Allergen Reaction Noted  . Thorazine [chlorpromazine hcl] Anaphylaxis 10/20/2010  . Acetaminophen Other (See Comments)   . Aspirin Other (See Comments)   . Aspirin-acetaminophen-caffeine Other (See Comments)   . Nsaids Nausea And Vomiting   . Other  03/22/2019  . Penicillins Nausea And Vomiting   . Tomato Rash 01/17/2012    Family History  Problem Relation Age of Onset  . Stroke Mother   . Heart attack Father   . Schizophrenia Other   . Drug abuse Other   . Alcohol abuse Other   . Colon cancer Other        aunt  . Obesity Other   . COPD Other   . GER disease Other   . Diabetes type II Other   . Anxiety disorder Other   . Depression Other   . Depression Sister   . Schizophrenia Sister   . Liver disease Neg Hx   . Inflammatory bowel disease Neg Hx     Social  History   Socioeconomic History  . Marital status: Divorced    Spouse name: Not on file  . Number of children: Not on file  . Years of education: Not on file  . Highest education level: High school graduate  Occupational History  . Occupation: disabled    Fish farm manager: UNEMPLOYED  Social Needs  . Financial resource strain: Not hard at all  . Food insecurity    Worry: Never true    Inability: Never true  . Transportation needs    Medical: No    Non-medical: No  Tobacco Use  . Smoking status: Current Every Day Smoker    Packs/day: 0.25  Years: 40.00    Pack years: 10.00    Types: Cigarettes  . Smokeless tobacco: Never Used  Substance and Sexual Activity  . Alcohol use: No  . Drug use: No  . Sexual activity: Yes    Partners: Male    Birth control/protection: None    Comment: boyfriend  Lifestyle  . Physical activity    Days per week: 7 days    Minutes per session: 20 min  . Stress: Only a little  Relationships  . Social Herbalist on phone: Not on file    Gets together: Not on file    Attends religious service: Not on file    Active member of club or organization: Not on file    Attends meetings of clubs or organizations: Not on file    Relationship status: Not on file  Other Topics Concern  . Not on file  Social History Narrative  . Not on file    Review of Systems: Gen: Denies fever, chills, anorexia. Denies fatigue, weakness, weight loss.  CV: Denies chest pain, palpitations, syncope, peripheral edema, and claudication. Resp: Denies dyspnea at rest, cough, wheezing, coughing up blood, and pleurisy. GI: see HPI Derm: Denies rash, itching, dry skin Psych: Denies depression, anxiety, memory loss, confusion. No homicidal or suicidal ideation.  Heme: see HPI  Physical Exam: BP 132/80   Pulse 98   Temp (!) 95.9 F (35.5 C) (Temporal)   Ht 5\' 9"  (1.753 m)   Wt 247 lb (112 kg)   BMI 36.48 kg/m  General:   Alert and oriented. No distress noted.  Pleasant and cooperative.  Head:  Normocephalic and atraumatic. Eyes:  Conjuctiva clear without scleral icterus. Abdomen:  +BS, soft, non-tender and non-distended. No rebound or guarding. No HSM or masses noted. Msk:  Symmetrical without gross deformities. Normal posture. Extremities:  With pedal edema Neurologic:  Alert and  oriented x4 Psych:  Alert and cooperative. Normal mood and affect.

## 2019-04-02 NOTE — Assessment & Plan Note (Signed)
58 year old female with solid food and liquid dysphagia in setting of chronic GERD, despite Aciphex BID. Historically responded well to empiric dilation in 2018. Will pursue repeat EGD with dilation in near future.  Proceed with upper endoscopy/dilation in the near future with Dr. Gala Romney. The risks, benefits, and alternatives have been discussed in detail with patient. They have stated understanding and desire to proceed.  Propofol due to polypharmacy Aciphex BID

## 2019-04-03 NOTE — Patient Outreach (Signed)
Why [facility]) Care Management   LATE ENTRY FROM 04/02/2019  04/03/2019  ROSENDA GEFFRARD 1961/05/07 [id number]  Patient's social worker [name] [name]) called me back. [name] is listed on the patient's chart as a caregiver.   It was explained to [place] that all of the patient's psych meds were filled at [facility] for 90 day supplies in August and that her other medications were still being dispensed in pill packs from [facility] monthly.  [facility] was called with the patient on speaker earlier this month and it was requested that they no longer send her medications through the mail.  [name] agreed with this decision and said she would speak with the patient's in-home care worker about deciphering the medications.  Atorvastatin was discussed as it was discontinued at discharge but dispensed in the pill packs from [facility] on 03/20/2019.  [name] said she would call to [facility] to find out the markings on the tablet and direct the patient's in-home care aid to take the atorvastatin tablets out of the pill back for the next week.   [name] and I said we would be in touch with each other to help the patient's pill packs go back to containing all of her medications.  Plan: Continue with previously scheduled follow up.   Elayne Guerin, PharmD, [facility] Clinical Pharmacist [phone]

## 2019-04-08 ENCOUNTER — Ambulatory Visit (INDEPENDENT_AMBULATORY_CARE_PROVIDER_SITE_OTHER): Payer: Medicare Other | Admitting: Family Medicine

## 2019-04-08 ENCOUNTER — Other Ambulatory Visit: Payer: Self-pay

## 2019-04-08 ENCOUNTER — Encounter: Payer: Self-pay | Admitting: Family Medicine

## 2019-04-08 VITALS — BP 132/82 | Temp 95.7°F | Wt 241.6 lb

## 2019-04-08 DIAGNOSIS — T796XXD Traumatic ischemia of muscle, subsequent encounter: Secondary | ICD-10-CM | POA: Diagnosis not present

## 2019-04-08 DIAGNOSIS — I959 Hypotension, unspecified: Secondary | ICD-10-CM | POA: Diagnosis not present

## 2019-04-08 DIAGNOSIS — I10 Essential (primary) hypertension: Secondary | ICD-10-CM | POA: Diagnosis not present

## 2019-04-08 DIAGNOSIS — K588 Other irritable bowel syndrome: Secondary | ICD-10-CM | POA: Diagnosis not present

## 2019-04-08 DIAGNOSIS — D6489 Other specified anemias: Secondary | ICD-10-CM | POA: Diagnosis not present

## 2019-04-08 MED ORDER — TRAZODONE HCL 100 MG PO TABS: ORAL_TABLET | ORAL | 0 refills | Status: DC

## 2019-04-08 MED ORDER — DICYCLOMINE HCL 10 MG PO CAPS: ORAL_CAPSULE | ORAL | 5 refills | Status: DC

## 2019-04-08 NOTE — Progress Notes (Signed)
Subjective:    Patient ID: [name], female    DOB: [birth year], 58 y.o.   MRN: [id number]  HPI Pt states that she is having some changes in her appetite. Pt states that sometimes she gets nausea when she eats, then sometimes she can not eat. Pt has had some diarrhea and while in hospital. Pt went to [facility] on 03/22/2019 and stayed for 3 days. Nurse from [facility] informed pt to stop taking Lipitor for 7 days.   Patient does have times where she gets dizzy when she stands up she did have a fall certainly this caused some serious issues.  She denies any type of vomiting issues but does relate nausea and she also states some diarrhea and some intermittent abdominal discomfort the difficult part is that this patient tends to have lots of positive symptoms so it is hard to minced through what is truly going on  I did review over her hospital records and the CAT scans with her her social worker and myself in certainly she did have rhabdomyolysis which was very serious.   His best we can tell her diabetes under decent control recent A1c look good at 6.2 she denies any low sugar spells tries to eat relatively healthy.  This patient lives by herself she has a aide that comes in it is necessary for her to have a because she has some decreased ADLs.  In addition to this she has a boyfriend who checks in on her on the weekend.  She cannot afford a monitor to wear around her neck. Review of Systems  Constitutional: Negative for activity change, appetite change and fatigue.  HENT: Negative for congestion and rhinorrhea.   Respiratory: Negative for cough and shortness of breath.   Cardiovascular: Negative for chest pain and leg swelling.  Gastrointestinal: Positive for nausea and vomiting. Negative for abdominal pain and diarrhea.  Endocrine: Negative for polydipsia and polyphagia.  Skin: Negative for color change.  Neurological: Negative for dizziness and weakness.  Psychiatric/Behavioral:  Negative for behavioral problems and confusion.       Objective:   Physical Exam Vitals signs reviewed.  Constitutional:      General: She is not in acute distress. HENT:     Head: Normocephalic and atraumatic.  Eyes:     General:        Right eye: No discharge.        Left eye: No discharge.  Neck:     Trachea: No tracheal deviation.  Cardiovascular:     Rate and Rhythm: Normal rate and regular rhythm.     Heart sounds: Normal heart sounds. No murmur.  Pulmonary:     Effort: Pulmonary effort is normal. No respiratory distress.     Breath sounds: Normal breath sounds.  Lymphadenopathy:     Cervical: No cervical adenopathy.  Skin:    General: Skin is warm and dry.  Neurological:     Mental Status: She is alert.     Coordination: Coordination normal.  Psychiatric:        Behavior: Behavior normal.     Diabetic foot exam she does have some calluses and significantly enlarged feet but no neuropathy      Assessment & Plan:  Diabetes stable currently no need to order A1c  Recent rhabdomyolysis with significant elevated CK-it is necessary for this patient to stay away from Lipitor for the next 7 days it is also necessary for her to check lab work to look at  kidney function and CK  Relative hypotension her blood pressure was on the low end at 100/70 I recommend stopping propranolol.  I do not feel the benefit is there.  Also recommend to stop lisinopril.  She will follow-up in 6 weeks time  She does have irritable bowel but I really feel she is having [place] medicine.  I recommend the dicyclomine to be only as needed  I am sure she has some insomnia issues but she over amplifies her symptoms.  I am concerned based upon the ER record of her being disoriented that she may be on too much medication.  I did advise her to reduce her trazodone down to 100 mg each evening.  She will continue on her other psychiatric medicines and follow-up with Dr. Harrington Challenger.  The social worker has  requested that all medications be through Rx care  Transitional care time spent with patient today regarding all of this.

## 2019-04-09 ENCOUNTER — Ambulatory Visit: Payer: Self-pay

## 2019-04-09 LAB — CBC WITH DIFFERENTIAL/PLATELET
Basophils Absolute: 0.1 10*3/uL (ref 0.0–0.2)
Basos: 2 %
EOS (ABSOLUTE): 0.1 10*3/uL (ref 0.0–0.4)
Eos: 1 %
Hematocrit: 39.9 % (ref 34.0–46.6)
Hemoglobin: 13.6 g/dL (ref 11.1–15.9)
Immature Grans (Abs): 0 10*3/uL (ref 0.0–0.1)
Immature Granulocytes: 0 %
Lymphocytes Absolute: 2.2 10*3/uL (ref 0.7–3.1)
Lymphs: 41 %
MCH: 30.5 pg (ref 26.6–33.0)
MCHC: 34.1 g/dL (ref 31.5–35.7)
MCV: 90 fL (ref 79–97)
Monocytes Absolute: 0.7 10*3/uL (ref 0.1–0.9)
Monocytes: 13 %
Neutrophils Absolute: 2.2 10*3/uL (ref 1.4–7.0)
Neutrophils: 43 %
Platelets: 263 10*3/uL (ref 150–450)
RBC: 4.46 x10E6/uL (ref 3.77–5.28)
RDW: 11.9 % (ref 11.7–15.4)
WBC: 5.3 10*3/uL (ref 3.4–10.8)

## 2019-04-09 LAB — BASIC METABOLIC PANEL
BUN/Creatinine Ratio: 5 — ABNORMAL LOW (ref 9–23)
BUN: 4 mg/dL — ABNORMAL LOW (ref 6–24)
CO2: 31 mmol/L — ABNORMAL HIGH (ref 20–29)
Calcium: 9.8 mg/dL (ref 8.7–10.2)
Chloride: 94 mmol/L — ABNORMAL LOW (ref 96–106)
Creatinine, Ser: 0.84 mg/dL (ref 0.57–1.00)
GFR calc Af Amer: 90 mL/min/{1.73_m2} (ref >59)
GFR calc non Af Amer: 78 mL/min/{1.73_m2} (ref >59)
Glucose: 91 mg/dL (ref 65–99)
Potassium: 3.7 mmol/L (ref 3.5–5.2)
Sodium: 137 mmol/L (ref 134–144)

## 2019-04-09 LAB — HEPATIC FUNCTION PANEL
ALT: 16 IU/L (ref 0–32)
AST: 21 IU/L (ref 0–40)
Albumin: 4.5 g/dL (ref 3.8–4.9)
Alkaline Phosphatase: 110 IU/L (ref 39–117)
Bilirubin Total: 0.3 mg/dL (ref 0.0–1.2)
Bilirubin, Direct: 0.11 mg/dL (ref 0.00–0.40)
Total Protein: 7 g/dL (ref 6.0–8.5)

## 2019-04-09 LAB — CK: Total CK: 266 U/L — ABNORMAL HIGH (ref 32–182)

## 2019-04-11 ENCOUNTER — Other Ambulatory Visit: Payer: Self-pay

## 2019-04-11 NOTE — Patient Outreach (Signed)
Telephone assessment:  Placed call to patient and spoke with [name] who is case worker. Reports patient is doing " her normal".  Reports 1 fall after she was discharged from hospital.  Case worker reports that patient uses a walker but walks in an awkward way.  Also patient saw MD and medications were changed due to hypotension and the thought that falls may be related to medications.   Case worker very involved in care.  I suggested to case worker that patient shower when her caregivers are present since last fall was in bath tub.  Case worker states that she will make that suggestion.  PLAN: will plan follow up call in 2 weeks.  Reviewed safety precautions and removal of tripping hazards.   Denise Rand, RN, BSN, CEN [facility] [employer] [phone]

## 2019-04-14 ENCOUNTER — Telehealth (HOSPITAL_COMMUNITY): Payer: Self-pay | Admitting: *Deleted

## 2019-04-14 ENCOUNTER — Other Ambulatory Visit: Payer: Self-pay | Admitting: Pharmacist

## 2019-04-14 NOTE — Telephone Encounter (Signed)
[facility] Pharmacy Mgr Ed called concerned that Social Worker noted Patient had bottles of medication from a home delivery RX. And that they are managing her script care because of her special needs she gets medication in bubble packs. Pharmacy Mgr was going to check into which home delivery service is filling med's.

## 2019-04-14 NOTE — Patient Outreach (Signed)
[facility]) Care Management  04/14/2019  [name] 06/27/1961 [id number]   Spoke with patient's Social Worker, Foye Spurling. HIPAA identifiers were obtained. [name] confirmed the patient's home health aid was able to remove Atorvastatin from her pill packs. She also said she spoke with the patient's psychiatrist about her psych medications being filled at [facility] in bottles versus [facility] pill packing. [name] said the home health aid is monitoring the patient's medications and understands some are in pill packing and some are in bottles.  Plan: Follow up with [name] when it is time for the patient's pill packs to be corrected. (November 2020)  Elayne Guerin, PharmD, [place] Clinical Pharmacist [phone]

## 2019-04-15 ENCOUNTER — Telehealth: Payer: Self-pay | Admitting: Family Medicine

## 2019-04-15 NOTE — Telephone Encounter (Signed)
Nurses please do a prescription for this as requested Please put on the prescription ataxia, frequent falls, weakness thank you

## 2019-04-15 NOTE — Telephone Encounter (Signed)
Patient has a prescription for a shower seat but [name] said a Facilities manager chair" would be better because it will be easier for her to use to get in out instead of the regular chair.   Have to have the rx say this for Medicare/Medicaid to pay for it.  DX: Ataxia   [phone]

## 2019-04-15 NOTE — Telephone Encounter (Signed)
Prescription faxed to pharmacy.

## 2019-04-28 ENCOUNTER — Other Ambulatory Visit: Payer: Self-pay

## 2019-04-28 NOTE — Patient Outreach (Signed)
Telephone assessment:  Placed call to patient, who answered and reports she is doing well. Reports she continues to have falls. Reports 2 falls since hospital discharge a month ago. Reports no injury.  Patient reports she is using her walker.  Reports she is weak in her legs at times.   PLAN: reviewed fall precautions and encouraged patient to get up slowly and stand before walking. Reviewed importance of safety.  Will plan follow up in 1 week.  Tomasa Rand, RN, BSN, CEN [facility] [employer] [phone]

## 2019-04-29 ENCOUNTER — Other Ambulatory Visit (HOSPITAL_COMMUNITY)
Admission: RE | Admit: 2019-04-29 | Discharge: 2019-04-29 | Disposition: A | Discharge status: Home / Self Care | Payer: Medicare Other | Source: Ambulatory Visit | Attending: Internal Medicine | Admitting: Internal Medicine

## 2019-04-29 ENCOUNTER — Other Ambulatory Visit (HOSPITAL_COMMUNITY): Payer: Medicare Other

## 2019-04-29 ENCOUNTER — Other Ambulatory Visit: Payer: Self-pay

## 2019-04-29 ENCOUNTER — Encounter (HOSPITAL_COMMUNITY)
Admission: RE | Admit: 2019-04-29 | Discharge: 2019-04-29 | Disposition: A | Discharge status: Home / Self Care | Payer: Medicare Other | Source: Ambulatory Visit | Attending: Internal Medicine | Admitting: Internal Medicine

## 2019-04-29 DIAGNOSIS — Z20828 Contact with and (suspected) exposure to other viral communicable diseases: Secondary | ICD-10-CM | POA: Insufficient documentation

## 2019-04-29 DIAGNOSIS — Z01812 Encounter for preprocedural laboratory examination: Secondary | ICD-10-CM | POA: Insufficient documentation

## 2019-04-29 LAB — SARS CORONAVIRUS 2 (TAT 6-24 HRS): SARS Coronavirus 2: NEGATIVE

## 2019-05-02 ENCOUNTER — Other Ambulatory Visit: Payer: Self-pay

## 2019-05-02 ENCOUNTER — Encounter (HOSPITAL_COMMUNITY)
Admission: RE | Disposition: A | Discharge status: Home / Self Care | Payer: Self-pay | Source: Home / Self Care | Attending: Internal Medicine

## 2019-05-02 ENCOUNTER — Encounter (HOSPITAL_COMMUNITY): Payer: Self-pay

## 2019-05-02 ENCOUNTER — Ambulatory Visit (HOSPITAL_COMMUNITY): Payer: Medicare Other | Admitting: Anesthesiology

## 2019-05-02 ENCOUNTER — Ambulatory Visit (HOSPITAL_COMMUNITY)
Admission: RE | Admit: 2019-05-02 | Discharge: 2019-05-02 | Disposition: A | Discharge status: Home / Self Care | Payer: Medicare Other | Attending: Internal Medicine | Admitting: Internal Medicine

## 2019-05-02 DIAGNOSIS — Z888 Allergy status to other drugs, medicaments and biological substances status: Secondary | ICD-10-CM | POA: Diagnosis not present

## 2019-05-02 DIAGNOSIS — Z79899 Other long term (current) drug therapy: Secondary | ICD-10-CM | POA: Insufficient documentation

## 2019-05-02 DIAGNOSIS — R1310 Dysphagia, unspecified: Secondary | ICD-10-CM | POA: Diagnosis not present

## 2019-05-02 DIAGNOSIS — K219 Gastro-esophageal reflux disease without esophagitis: Secondary | ICD-10-CM | POA: Insufficient documentation

## 2019-05-02 DIAGNOSIS — F79 Unspecified intellectual disabilities: Secondary | ICD-10-CM | POA: Insufficient documentation

## 2019-05-02 DIAGNOSIS — F419 Anxiety disorder, unspecified: Secondary | ICD-10-CM | POA: Diagnosis not present

## 2019-05-02 DIAGNOSIS — F209 Schizophrenia, unspecified: Secondary | ICD-10-CM | POA: Insufficient documentation

## 2019-05-02 DIAGNOSIS — I10 Essential (primary) hypertension: Secondary | ICD-10-CM | POA: Insufficient documentation

## 2019-05-02 DIAGNOSIS — Z886 Allergy status to analgesic agent status: Secondary | ICD-10-CM | POA: Insufficient documentation

## 2019-05-02 DIAGNOSIS — F17210 Nicotine dependence, cigarettes, uncomplicated: Secondary | ICD-10-CM | POA: Diagnosis not present

## 2019-05-02 DIAGNOSIS — G4730 Sleep apnea, unspecified: Secondary | ICD-10-CM | POA: Insufficient documentation

## 2019-05-02 DIAGNOSIS — I6930 Unspecified sequelae of cerebral infarction: Secondary | ICD-10-CM | POA: Diagnosis not present

## 2019-05-02 DIAGNOSIS — Z880 Allergy status to penicillin: Secondary | ICD-10-CM | POA: Diagnosis not present

## 2019-05-02 DIAGNOSIS — E119 Type 2 diabetes mellitus without complications: Secondary | ICD-10-CM | POA: Diagnosis not present

## 2019-05-02 DIAGNOSIS — K625 Hemorrhage of anus and rectum: Secondary | ICD-10-CM | POA: Insufficient documentation

## 2019-05-02 DIAGNOSIS — K580 Irritable bowel syndrome with diarrhea: Secondary | ICD-10-CM | POA: Insufficient documentation

## 2019-05-02 DIAGNOSIS — I252 Old myocardial infarction: Secondary | ICD-10-CM | POA: Diagnosis not present

## 2019-05-02 HISTORY — PX: ESOPHAGOGASTRODUODENOSCOPY (EGD) WITH PROPOFOL: SHX5813

## 2019-05-02 HISTORY — PX: MALONEY DILATION: SHX5535

## 2019-05-02 LAB — GLUCOSE, CAPILLARY
Glucose-Capillary: 93 mg/dL (ref 70–99)
Glucose-Capillary: 88 mg/dL (ref 70–99)

## 2019-05-02 SURGERY — ESOPHAGOGASTRODUODENOSCOPY (EGD) WITH PROPOFOL
Anesthesia: General

## 2019-05-02 MED ORDER — CHLORHEXIDINE GLUCONATE CLOTH 2 % EX PADS: 6.0000 | MEDICATED_PAD | Freq: Once | CUTANEOUS | Status: DC

## 2019-05-02 MED ORDER — LACTATED RINGERS IV SOLN
INTRAVENOUS | Status: DC
  Administered 2019-05-02: 15:00:00 1000 mL via INTRAVENOUS

## 2019-05-02 MED ORDER — PROPOFOL 500 MG/50ML IV EMUL
INTRAVENOUS | Status: DC | PRN
  Administered 2019-05-02: 150 ug/kg/min via INTRAVENOUS

## 2019-05-02 MED ORDER — MIDAZOLAM HCL 2 MG/2ML IJ SOLN: 0.5000 mg | Freq: Once | INTRAMUSCULAR | Status: DC | PRN

## 2019-05-02 MED ORDER — KETAMINE HCL 50 MG/5ML IJ SOSY
PREFILLED_SYRINGE | INTRAMUSCULAR | Status: AC
  Filled 2019-05-02: qty 5

## 2019-05-02 MED ORDER — HYDROMORPHONE HCL 1 MG/ML IJ SOLN: 0.2500 mg | INTRAMUSCULAR | Status: DC | PRN

## 2019-05-02 MED ORDER — LIDOCAINE HCL 1 % IJ SOLN
INTRAMUSCULAR | Status: DC | PRN
  Administered 2019-05-02: 50 mg via INTRADERMAL

## 2019-05-02 MED ORDER — MIDAZOLAM HCL 2 MG/2ML IJ SOLN
INTRAMUSCULAR | Status: AC
  Filled 2019-05-02: qty 2

## 2019-05-02 MED ORDER — KETAMINE HCL 10 MG/ML IJ SOLN
INTRAMUSCULAR | Status: DC | PRN
  Administered 2019-05-02: 10 mg via INTRAVENOUS

## 2019-05-02 MED ORDER — MIDAZOLAM HCL 5 MG/5ML IJ SOLN
INTRAMUSCULAR | Status: DC | PRN
  Administered 2019-05-02: 2 mg via INTRAVENOUS

## 2019-05-02 MED ORDER — PROPOFOL 10 MG/ML IV BOLUS
INTRAVENOUS | Status: DC | PRN
  Administered 2019-05-02 (×2): 20 mg via INTRAVENOUS

## 2019-05-02 NOTE — Anesthesia Postprocedure Evaluation (Signed)
Anesthesia Post Note  Patient: [name]  Procedure(s) Performed: ESOPHAGOGASTRODUODENOSCOPY (EGD) WITH PROPOFOL (N/A ) MALONEY DILATION (N/A )  Patient location during evaluation: PACU Anesthesia Type: General Level of consciousness: awake and alert Pain management: pain level controlled Vital Signs Assessment: post-procedure vital signs reviewed and stable Respiratory status: spontaneous breathing Cardiovascular status: blood pressure returned to baseline and stable Postop Assessment: no apparent nausea or vomiting Anesthetic complications: no     Last Vitals:  Vitals:   05/02/19 1420 05/02/19 1422  BP:  137/71  Pulse: 78   Resp: 18   SpO2: 97%     Last Pain:  Vitals:   05/02/19 1538  TempSrc:   PainSc: 0-No pain                 Ninel Abdella

## 2019-05-02 NOTE — Transfer of Care (Signed)
Immediate Anesthesia Transfer of Care Note  Patient: [name]  Procedure(s) Performed: ESOPHAGOGASTRODUODENOSCOPY (EGD) WITH PROPOFOL (N/A ) MALONEY DILATION (N/A )  Patient Location: PACU  Anesthesia Type:General  Level of Consciousness: awake  Airway & Oxygen Therapy: Patient Spontanous Breathing  Post-op Assessment: Report given to RN  Post vital signs: Reviewed and stable  Last Vitals:  Vitals Value Taken Time  BP 116/54 05/02/19 1602  Temp    Pulse    Resp 18 05/02/19 1609  SpO2    Vitals shown include unvalidated device data.  Last Pain:  Vitals:   05/02/19 1538  TempSrc:   PainSc: 0-No pain         Complications: No apparent anesthesia complications

## 2019-05-02 NOTE — Op Note (Signed)
[facility] Patient Name: [name] Procedure Date: 05/02/2019 3:22 PM MRN: [id number] Date of Birth: Nov 17, 1960 Attending MD: Norvel Richards , MD CSN: [id number] Age: 58 Admit Type: Outpatient Procedure:                Upper GI endoscopy Indications:              Dysphagia Providers:                Norvel Richards, MD, Janeece Riggers, RN, Starla Link RN, RN, Randa Spike, Technician Referring MD:              Medicines:                Propofol per Anesthesia Complications:            No immediate complications. Estimated Blood Loss:     Estimated blood loss: none. Procedure:                Pre-Anesthesia Assessment:                           - Prior to the procedure, a History and Physical                            was performed, and patient medications and                            allergies were reviewed. The patient's tolerance of                            previous anesthesia was also reviewed. The risks                            and benefits of the procedure and the sedation                            options and risks were discussed with the patient.                            All questions were answered, and informed consent                            was obtained. Prior Anticoagulants: The patient has                            taken no previous anticoagulant or antiplatelet                            agents. ASA Grade Assessment: II - A patient with                            mild systemic disease. After reviewing the risks  and benefits, the patient was deemed in                            satisfactory condition to undergo the procedure.                           After obtaining informed consent, the endoscope was                            passed under direct vision. Throughout the                            procedure, the patient's blood pressure, pulse, and                            oxygen  saturations were monitored continuously. The                            GIF-H190 ([id number]) scope was introduced through the                            mouth, and advanced to the second part of duodenum.                            The upper GI endoscopy was accomplished without                            difficulty. The patient tolerated the procedure                            well. Scope In: 3:44:44 PM Scope Out: 3:51:02 PM Total Procedure Duration: 0 hours 6 minutes 18 seconds  Findings:      The examined esophagus was normal. The scope was withdrawn. Dilation was       performed with a Maloney dilator with mild resistance at 56 Fr. The       scope was withdrawn. Dilation was performed with a Maloney dilator with       mild resistance at 81 Fr. The dilation site was examined following       endoscope reinsertion and showed no change. Estimated blood loss: none.      The entire examined stomach was normal.      The duodenal bulb and second portion of the duodenum were normal. Impression:               - Normal esophagus. Dilated.                           - Normal stomach.                           - Normal duodenal bulb and second portion of the                            duodenum.                           -  No specimens collected. Social worker reports of                            intermittent nausea and vomiting after eating a                            meal without other associated symptoms. Moderate Sedation:      Moderate (conscious) sedation was personally administered by an       anesthesia professional. The following parameters were monitored: oxygen       saturation, heart rate, blood pressure, respiratory rate, EKG, adequacy       of pulmonary ventilation, and response to care. Recommendation:           - Patient has a contact number available for                            emergencies. The signs and symptoms of potential                            delayed complications  were discussed with the                            patient. Return to normal activities tomorrow.                            Written discharge instructions were provided to the                            patient.                           - Resume previous diet.                           - Continue present medications.                           - Return to GI clinic in 8 weeks. Re-assess in the                            office in 8 weeks. Procedure Code(s):        --- Professional ---                           [phone], Esophagogastroduodenoscopy, flexible,                            transoral; diagnostic, including collection of                            specimen(s) by brushing or washing, when performed                            (separate procedure)  43450, Dilation of esophagus, by unguided sound or                            bougie, single or multiple passes Diagnosis Code(s):        --- Professional ---                           R13.10, Dysphagia, unspecified CPT copyright 2019 American Medical Association. All rights reserved. The codes documented in this report are preliminary and upon coder review may  be revised to meet current compliance requirements. [name]. Rosey Eide, MD Norvel Richards, MD 05/02/2019 4:03:44 PM This report has been signed electronically. Number of Addenda: 0

## 2019-05-02 NOTE — Anesthesia Preprocedure Evaluation (Addendum)
Anesthesia Evaluation  Patient identified by MRN, date of birth, ID band Patient awake    Reviewed: Allergy & Precautions, NPO status , Patient's Chart, lab work & pertinent test results, reviewed documented beta blocker date and time   Airway Mallampati: I  TM Distance: >3 FB Neck ROM: Full    Dental no notable dental hx. (+) Edentulous Upper, Edentulous Lower   Pulmonary sleep apnea and Continuous Positive Airway Pressure Ventilation , Current SmokerPatient did not abstain from smoking.,    Pulmonary exam normal breath sounds clear to auscultation       Cardiovascular Exercise Tolerance: Poor hypertension, + Past MI  Normal cardiovascular examII Rhythm:Regular Rate:Normal  Very poor historian with h/o falls     Neuro/Psych  Headaches, PSYCHIATRIC DISORDERS Anxiety Schizophrenia Uses walker and WC  Neuromuscular disease CVA, Residual Symptoms    GI/Hepatic Neg liver ROS, GERD  Medicated and Controlled,  Endo/Other  negative endocrine ROSdiabetesBMI ~39  Renal/GU negative Renal ROS  negative genitourinary   Musculoskeletal  (+) Arthritis , Osteoarthritis,    Abdominal   Peds negative pediatric ROS (+)  Hematology negative hematology ROS (+)   Anesthesia Other Findings H/o of mult falls with Rhabdo ~ 1 month prior  CKs markedly decreased -reports continued falling   Reproductive/Obstetrics negative OB ROS                             Anesthesia Physical Anesthesia Plan  ASA: IV  Anesthesia Plan: General   Post-op Pain Management:    Induction: Intravenous  PONV Risk Score and Plan: 2 and Propofol infusion and TIVA  Airway Management Planned: Simple Face Mask and Nasal Cannula  Additional Equipment:   Intra-op Plan:   Post-operative Plan:   Informed Consent: I have reviewed the patients History and Physical, chart, labs and discussed the procedure including the risks, benefits  and alternatives for the proposed anesthesia with the patient or authorized representative who has indicated his/her understanding and acceptance.     Dental advisory given  Plan Discussed with: CRNA  Anesthesia Plan Comments: (Plan Full PPE use  Plan GA with GETA as needed d/w pt -WTP with same after Q&A  D/w pt unlikely need for postprocedural ventilation as needed  Voiced understanding WTP)        Anesthesia Quick Evaluation

## 2019-05-02 NOTE — Discharge Instructions (Signed)
EGD Discharge instructions Please read the instructions outlined below and refer to this sheet in the next few weeks. These discharge instructions provide you with general information on caring for yourself after you leave the hospital. Your doctor may also give you specific instructions. While your treatment has been planned according to the most current medical practices available, unavoidable complications occasionally occur. If you have any problems or questions after discharge, please call your doctor. ACTIVITY  You may resume your regular activity but move at a slower pace for the next 24 hours.   Take frequent rest periods for the next 24 hours.   Walking will help expel (get rid of) the air and reduce the bloated feeling in your abdomen.   No driving for 24 hours (because of the anesthesia (medicine) used during the test).   You may shower.   Do not sign any important legal documents or operate any machinery for 24 hours (because of the anesthesia used during the test).  NUTRITION  Drink plenty of fluids.   You may resume your normal diet.   Begin with a light meal and progress to your normal diet.   Avoid alcoholic beverages for 24 hours or as instructed by your caregiver.  MEDICATIONS  You may resume your normal medications unless your caregiver tells you otherwise.  WHAT YOU CAN EXPECT TODAY  You may experience abdominal discomfort such as a feeling of fullness or gas pains.  FOLLOW-UP  Your doctor will discuss the results of your test with you.  SEEK IMMEDIATE MEDICAL ATTENTION IF ANY OF THE FOLLOWING OCCUR:  Excessive nausea (feeling sick to your stomach) and/or vomiting.   Severe abdominal pain and distention (swelling).   Trouble swallowing.   Temperature over 101 F (37.8 C).   Rectal bleeding or vomiting of blood.    Continue rabeprazole 20 mg daily  I spoke to [name], social worker, at [phone] with results  Office visit with [place] in  8 weeks      Upper Endoscopy, Adult, Care After This sheet gives you information about how to care for yourself after your procedure. Your health care provider may also give you more specific instructions. If you have problems or questions, contact your health care provider. What can I expect after the procedure? After the procedure, it is common to have:  A sore throat.  Mild stomach pain or discomfort.  Bloating.  Nausea. Follow these instructions at home:   Follow instructions from your health care provider about what to eat or drink after your procedure.  Return to your normal activities as told by your health care provider. Ask your health care provider what activities are safe for you.  Take over-the-counter and prescription medicines only as told by your health care provider.  Do not drive for 24 hours if you were given a sedative during your procedure.  Keep all follow-up visits as told by your health care provider. This is important. Contact a health care provider if you have:  A sore throat that lasts longer than one day.  Trouble swallowing. Get help right away if:  You vomit blood or your vomit looks like coffee grounds.  You have: ? A fever. ? Bloody, black, or tarry stools. ? A severe sore throat or you cannot swallow. ? Difficulty breathing. ? Severe pain in your chest or abdomen. Summary  After the procedure, it is common to have a sore throat, mild stomach discomfort, bloating, and nausea.  Do not drive for 24  hours if you were given a sedative during the procedure.  Follow instructions from your health care provider about what to eat or drink after your procedure.  Return to your normal activities as told by your health care provider. This information is not intended to replace advice given to you by your health care provider. Make sure you discuss any questions you have with your health care provider. Document Released: 12/26/2011 Document  Revised: 12/18/2017 Document Reviewed: 11/26/2017 Elsevier Patient Education  2020 [employer] After These instructions provide you with information about caring for yourself after your procedure. Your health care provider may also give you more specific instructions. Your treatment has been planned according to current medical practices, but problems sometimes occur. Call your health care provider if you have any problems or questions after your procedure. What can I expect after the procedure? After your procedure, you may:  Feel sleepy for several hours.  Feel clumsy and have poor balance for several hours.  Feel forgetful about what happened after the procedure.  Have poor judgment for several hours.  Feel nauseous or vomit.  Have a sore throat if you had a breathing tube during the procedure. Follow these instructions at home: For at least 24 hours after the procedure:      Have a responsible adult stay with you. It is important to have someone help care for you until you are awake and alert.  Rest as needed.  Do not: ? Participate in activities in which you could fall or become injured. ? Drive. ? Use heavy machinery. ? Drink alcohol. ? Take sleeping pills or medicines that cause drowsiness. ? Make important decisions or sign legal documents. ? Take care of children on your own. Eating and drinking  Follow the diet that is recommended by your health care provider.  If you vomit, drink water, juice, or soup when you can drink without vomiting.  Make sure you have little or no nausea before eating solid foods. General instructions  Take over-the-counter and prescription medicines only as told by your health care provider.  If you have sleep apnea, surgery and certain medicines can increase your risk for breathing problems. Follow instructions from your health care provider about wearing your sleep device: ? Anytime you are  sleeping, including during daytime naps. ? While taking prescription pain medicines, sleeping medicines, or medicines that make you drowsy.  If you smoke, do not smoke without supervision.  Keep all follow-up visits as told by your health care provider. This is important. Contact a health care provider if:  You keep feeling nauseous or you keep vomiting.  You feel light-headed.  You develop a rash.  You have a fever. Get help right away if:  You have trouble breathing. Summary  For several hours after your procedure, you may feel sleepy and have poor judgment.  Have a responsible adult stay with you for at least 24 hours or until you are awake and alert. This information is not intended to replace advice given to you by your health care provider. Make sure you discuss any questions you have with your health care provider. Document Released: 10/17/2015 Document Revised: 09/24/2017 Document Reviewed: 10/17/2015 Elsevier Patient Education  2020 [employer].

## 2019-05-02 NOTE — Interval H&P Note (Signed)
History and Physical Interval Note:  05/02/2019 3:31 PM  [name]  has presented today for surgery, with the diagnosis of dysphagia.  The various methods of treatment have been discussed with the patient and family. After consideration of risks, benefits and other options for treatment, the patient has consented to  Procedure(s) with comments: ESOPHAGOGASTRODUODENOSCOPY (EGD) WITH PROPOFOL (N/A) - 1:30pm MALONEY DILATION (N/A) as a surgical intervention.  The patient's history has been reviewed, patient examined, no change in status, stable for surgery.  I have reviewed the patient's chart and labs.  Questions were answered to the patient's satisfaction.     [name]  No change.  EGD with esophageal dilation as feasible/appropriate per plan.  The risks, benefits, limitations, alternatives and imponderables have been reviewed with the patient. Potential for esophageal dilation, biopsy, etc. have also been reviewed.  Questions have been answered. All parties agreeable.

## 2019-05-05 ENCOUNTER — Other Ambulatory Visit: Payer: Self-pay

## 2019-05-05 NOTE — Patient Outreach (Signed)
Telephone assessment:  Placed call to patient to follow up on falls. No answer and no machine.  PLAN:will call back in 3 days.  Tomasa Rand, RN, BSN, CEN [facility] [employer] [phone]

## 2019-05-06 NOTE — Addendum Note (Signed)
Addendum  created 05/06/19 1103 by Ollen Bowl, CRNA   Charge Capture section accepted

## 2019-05-07 ENCOUNTER — Encounter (HOSPITAL_COMMUNITY): Payer: Self-pay | Admitting: Internal Medicine

## 2019-05-08 ENCOUNTER — Other Ambulatory Visit: Payer: Self-pay

## 2019-05-08 NOTE — Patient Outreach (Signed)
Telephone assessment: 2nd attempt to reach patient without success.  PLAN: will mail unsuccessful outreach letter and attempt again.  Tomasa Rand, RN, BSN, CEN [facility] [employer] [phone]

## 2019-05-12 ENCOUNTER — Other Ambulatory Visit: Payer: Self-pay | Admitting: Pharmacist

## 2019-05-12 NOTE — Patient Outreach (Signed)
[facility]) Care Management  05/12/2019  [name] 01-06-1961 [id number]   Patient's caregiver [name]) was called to inquire about getting the patient's psych medications but back into pill packs. HIPAA identifiers were obtained.  Unfortunately, all of the patient's psych medications were filled by [facility].    The following medications were placed in a pill packs:  Benztropine lamotrigine Sertraline Invega Trazodone  Since the patient's fill cycle is off, she will have 2 packs--1 pack with non-psych meds and 1 pack with psych meds.  Misty at [facility] said they instructed the patient to bring back any medications she has in a bottle so they can be destroyed to decrease the chance of therapeutic duplications.  Called patient's caregiver/Social Worker [name] [name] back and asked her to make sure the patient had the medications in bottles taken back to [facility]. She agreed.  Called the patient's cell phone. No answer after > 30 rings.  Plan: Follow up in 1 month to be sure all medications are back in one pill pack.  Elayne Guerin, PharmD, [place] Clinical Pharmacist [phone]

## 2019-05-13 ENCOUNTER — Other Ambulatory Visit: Payer: Self-pay

## 2019-05-13 NOTE — Patient Outreach (Signed)
Telephone assessment:  3rd outreach attempt unsuccessful.  Letter already mailed.   PLAN: if no response to telephone calls and letter will plan to close case on Nov 9th  Tomasa Rand, Therapist, sports, Copywriter, advertising, [facility] [employer] [phone]

## 2019-05-19 ENCOUNTER — Other Ambulatory Visit: Payer: Self-pay

## 2019-05-19 ENCOUNTER — Other Ambulatory Visit: Payer: Self-pay | Admitting: Gastroenterology

## 2019-05-19 ENCOUNTER — Other Ambulatory Visit (HOSPITAL_COMMUNITY): Payer: Self-pay | Admitting: Psychiatry

## 2019-05-19 NOTE — Telephone Encounter (Signed)
Pt needs appt, call social worker

## 2019-05-19 NOTE — Telephone Encounter (Signed)
Per Provider: Pt needs appt, call social worker [facility] SW Foye Spurling # [phone]

## 2019-05-19 NOTE — Telephone Encounter (Signed)
CONFIRMED 10/16 APPT [facility] SW [name]  [phone]

## 2019-05-19 NOTE — Patient Outreach (Signed)
Closed to [facility] nursing:  I have been unable to reach patient and no response to letter. Will close case to nursing. Not that [place] remains active.  Tomasa Rand, RN, BSN, CEN [facility] [employer] [phone]

## 2019-05-20 ENCOUNTER — Ambulatory Visit (INDEPENDENT_AMBULATORY_CARE_PROVIDER_SITE_OTHER): Payer: Medicare Other | Admitting: Family Medicine

## 2019-05-20 ENCOUNTER — Other Ambulatory Visit: Payer: Self-pay

## 2019-05-20 ENCOUNTER — Ambulatory Visit: Payer: Medicare Other | Admitting: Family Medicine

## 2019-05-20 ENCOUNTER — Encounter: Payer: Self-pay | Admitting: Family Medicine

## 2019-05-20 VITALS — BP 134/84 | Temp 96.3°F | Wt 239.0 lb

## 2019-05-20 DIAGNOSIS — I10 Essential (primary) hypertension: Secondary | ICD-10-CM

## 2019-05-20 DIAGNOSIS — K760 Fatty (change of) liver, not elsewhere classified: Secondary | ICD-10-CM

## 2019-05-20 DIAGNOSIS — M25512 Pain in left shoulder: Secondary | ICD-10-CM | POA: Diagnosis not present

## 2019-05-20 NOTE — Telephone Encounter (Signed)
Medication was refilled by Dr. Wolfgang Phoenix in September.

## 2019-05-20 NOTE — Progress Notes (Signed)
Subjective:    Patient ID: [name], female    DOB: March 20, 1961, 57 y.o.   MRN: [id number]  HPI Pt here today for follow up. Pt has social worker Corene Cornea with her today. pt has lab work completed on 04/05/2019.   Pt states she is having some left arm/shoulder pain. Pt is unsure of medications.  Patient with significant shoulder pain and discomfort states is been present for 4 months yet when she was here just a couple months ago she did not have this problem nonetheless she states it hurts to move she denies any injury but she was in the hospital after sustaining a fall there was no x-ray of that shoulder Patient also has multiple other underlying health issues She had recent lab work which showed her creatinine kinase is actually doing much better Liver enzymes are doing better Kidney function doing well She states she is trying to eat healthy and try to stay physically active She does have a history of fatty liver Patient with morbid obesity she has been encouraged in the past try to lose weight is been very difficult for her to do so she has been encouraged also to try to minimize portions Results for orders placed or performed during the hospital encounter of 05/02/19  Glucose, capillary  Result Value Ref Range   Glucose-Capillary 93 70 - 99 mg/dL  Glucose, capillary  Result Value Ref Range   Glucose-Capillary 88 70 - 99 mg/dL    Review of Systems  Constitutional: Negative for activity change, appetite change and fatigue.  HENT: Negative for congestion and rhinorrhea.   Respiratory: Negative for cough and shortness of breath.   Cardiovascular: Negative for chest pain and leg swelling.  Gastrointestinal: Negative for abdominal pain and diarrhea.  Endocrine: Negative for polydipsia and polyphagia.  Skin: Negative for color change.  Neurological: Negative for dizziness and weakness.  Psychiatric/Behavioral: Negative for behavioral problems and confusion.       Objective:   Physical Exam Vitals signs reviewed.  Constitutional:      General: She is not in acute distress. HENT:     Head: Normocephalic and atraumatic.  Eyes:     General:        Right eye: No discharge.        Left eye: No discharge.  Neck:     Trachea: No tracheal deviation.  Cardiovascular:     Rate and Rhythm: Normal rate and regular rhythm.     Heart sounds: Normal heart sounds. No murmur.  Pulmonary:     Effort: Pulmonary effort is normal. No respiratory distress.     Breath sounds: Normal breath sounds.  Lymphadenopathy:     Cervical: No cervical adenopathy.  Skin:    General: Skin is warm and dry.  Neurological:     Mental Status: She is alert.     Coordination: Coordination normal.  Psychiatric:        Behavior: Behavior normal.   Patient has decreased range of motion of the left shoulder although her passive range of motion is actually good it is very difficult to do any of the shoulder maneuvers with the patient because she is highly symptomatic in all directions  Fall Risk  03/26/2019 02/27/2019 01/24/2019 08/19/2018 03/18/2018  Falls in the past year? 1 0 1 1 No  Comment - - late entry - -  Number falls in past yr: 1 0 1 1 -  Injury with Fall? 1 0 1 1 -  Risk for  fall due to : History of fall(s);Medication side effect;Impaired balance/gait;Mental status change;Impaired mobility;Impaired vision Impaired mobility History of fall(s);Impaired balance/gait;Orthopedic patient Impaired balance/gait;Impaired mobility;History of fall(s) -  Follow up Education provided;Falls prevention discussed;Falls evaluation completed Falls prevention discussed Falls evaluation completed;Education provided;Falls prevention discussed Falls prevention discussed -         Assessment & Plan:  1. Essential hypertension Blood pressure overall good she does try to watch her diet minimize salt in the diet and stays active within reason  2. Acute pain of left shoulder Significant shoulder pain  discomfort x-ray indicated range of motion exercises were shown physical therapy referral recheck in 4 weeks - DG Shoulder Left - Ambulatory referral to Physical Therapy  3. Fatty liver Fatty liver her recent liver enzymes look good continue current measures  4. Morbid obesity ([place]) She does have morbid obesity watch portions try to stay active try to lose weight 25 minutes was spent with the patient.  This statement verifies that 25 minutes was indeed spent with the patient.  More than 50% of this visit-total duration of the visit-was spent in counseling and coordination of care. The issues that the patient came in for today as reflected in the diagnosis (s) please refer to documentation for further details.

## 2019-05-23 ENCOUNTER — Ambulatory Visit (HOSPITAL_COMMUNITY)
Admission: RE | Admit: 2019-05-23 | Discharge: 2019-05-23 | Disposition: A | Discharge status: Home / Self Care | Payer: Medicare Other | Source: Ambulatory Visit | Attending: Family Medicine | Admitting: Family Medicine

## 2019-05-23 ENCOUNTER — Other Ambulatory Visit: Payer: Self-pay

## 2019-05-23 ENCOUNTER — Encounter (HOSPITAL_COMMUNITY): Payer: Self-pay

## 2019-05-23 DIAGNOSIS — M25512 Pain in left shoulder: Secondary | ICD-10-CM | POA: Insufficient documentation

## 2019-05-23 DIAGNOSIS — M19012 Primary osteoarthritis, left shoulder: Secondary | ICD-10-CM | POA: Diagnosis not present

## 2019-05-26 ENCOUNTER — Ambulatory Visit (INDEPENDENT_AMBULATORY_CARE_PROVIDER_SITE_OTHER): Payer: Medicare Other | Admitting: Psychiatry

## 2019-05-26 ENCOUNTER — Encounter (HOSPITAL_COMMUNITY): Payer: Self-pay | Admitting: Psychiatry

## 2019-05-26 ENCOUNTER — Other Ambulatory Visit: Payer: Self-pay

## 2019-05-26 DIAGNOSIS — F251 Schizoaffective disorder, depressive type: Secondary | ICD-10-CM

## 2019-05-26 MED ORDER — PALIPERIDONE ER 9 MG PO TB24: 9.0000 mg | ORAL_TABLET | Freq: Every day | ORAL | 2 refills | Status: DC

## 2019-05-26 MED ORDER — SERTRALINE HCL 100 MG PO TABS: 100.0000 mg | ORAL_TABLET | Freq: Every day | ORAL | 2 refills | Status: DC

## 2019-05-26 MED ORDER — TRAZODONE HCL 100 MG PO TABS: ORAL_TABLET | ORAL | 2 refills | Status: DC

## 2019-05-26 MED ORDER — BENZTROPINE MESYLATE 1 MG PO TABS: 1.0000 mg | ORAL_TABLET | Freq: Every day | ORAL | 2 refills | Status: DC

## 2019-05-26 MED ORDER — LAMOTRIGINE 100 MG PO TABS: 100.0000 mg | ORAL_TABLET | Freq: Two times a day (BID) | ORAL | 2 refills | Status: DC
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
## 2019-05-27 ENCOUNTER — Encounter: Payer: Medicare Other | Attending: Family Medicine | Admitting: Nutrition

## 2019-05-27 ENCOUNTER — Encounter: Payer: Medicare Other | Admitting: Nutrition

## 2019-05-27 ENCOUNTER — Encounter: Payer: Self-pay | Admitting: Nutrition

## 2019-05-27 ENCOUNTER — Other Ambulatory Visit: Payer: Self-pay

## 2019-05-27 DIAGNOSIS — E119 Type 2 diabetes mellitus without complications: Secondary | ICD-10-CM

## 2019-05-27 DIAGNOSIS — I10 Essential (primary) hypertension: Secondary | ICD-10-CM | POA: Diagnosis not present

## 2019-05-27 DIAGNOSIS — E782 Mixed hyperlipidemia: Secondary | ICD-10-CM | POA: Diagnosis not present

## 2019-05-27 NOTE — Patient Instructions (Signed)
Goals Eat breakfast daily- Don't skip meals Eat eggs and toast for breakfast and a sandwich and fruit for lunch daily. Increase fresh fruits and vegetables Drink more water. Lose 5 lbs in the next 3 months.

## 2019-05-27 NOTE — Progress Notes (Signed)
Telephone visit, follow up DM Medical Nutrition Therapy:  Appt start time: 1330 end time:  1400     Assessment:  Primary concerns today: Diabetes Type 2. F/u   No real changes.  Has cut out junk food. Still skips breakfast at times. Trying to eat more frruit and vegetables. Cutting down on junk food. Drinks more water. Can't walk by herself for safety reasons. Doesn't have anyone to walk with. Encuraged to walk more around inside her house. Discouraged sodas and sweets.. To see Dr. Wolfgang Phoenix in December 2020. NO recent weight.    A1C 6.2%.Not on any medications for Dm at this time.  Lab Results  Component Value Date   HGBA1C 6.2 (H) 02/12/2019   CMP Latest Ref Rng & Units 04/08/2019 03/25/2019 03/24/2019  Glucose 65 - 99 mg/dL 91 90 99  BUN 6 - 24 mg/dL 4(L) 6 5(L)  Creatinine 0.57 - 1.00 mg/dL 0.84 0.59 0.51  Sodium 134 - 144 mmol/L 137 142 138  Potassium 3.5 - 5.2 mmol/L 3.7 4.0 3.2(L)  Chloride 96 - 106 mmol/L 94(L) 109 102  CO2 20 - 29 mmol/L 31(H) 25 27  Calcium 8.7 - 10.2 mg/dL 9.8 8.4(L) 8.7(L)  Total Protein 6.0 - 8.5 g/dL 7.0 6.0(L) 6.6  Total Bilirubin 0.0 - 1.2 mg/dL 0.3 0.5 0.4  Alkaline Phos 39 - 117 IU/L 110 78 79  AST 0 - 40 IU/L 21 103(H) 111(H)  ALT 0 - 32 IU/L 16 82(H) 59(H)   Lipid Panel     Component Value Date/Time   CHOL 109 02/12/2019 1442   TRIG 89 02/12/2019 1442   HDL 53 02/12/2019 1442   CHOLHDL 2.1 02/12/2019 1442   CHOLHDL 2.9 07/20/2014 1214   VLDL 21 07/20/2014 1214   LDLCALC 38 02/12/2019 1442    Wt Readings from Last 3 Encounters:  05/20/19 239 lb (108.4 kg)  05/02/19 270 lb (122.5 kg)  04/08/19 241 lb 9.6 oz (109.6 kg)   Ht Readings from Last 3 Encounters:  05/02/19 5\' 10"  (1.778 m)  04/02/19 5\' 9"  (1.753 m)  03/22/19 5' 10.5" (1.791 m)   There is no height or weight on file to calculate BMI. @BMIFA @ Facility age limit for growth percentiles is 20 years. Facility age limit for growth percentiles is 20 years.  Preferred Learning  Style:  Visual, verbal, hands on.  Learning Readiness:  Ready  Change in progress   MEDICATIONS:   DIETARY INTAKE:  24-hr recall:  B ( AM):  Snk ( AM): eggs and toast L ( PM):  Sandwich   D ( PM): Chicken, eggs, 3, water, potatoes, skipped;   baked chicken, string beans, water Snk ( PM):  Beverages: water   Usual physical activity walking some   Estimated energy needs: 1200  calories 135 g carbohydrates 90 g protein 33 g fat  Progress Towards Goal(s):  In progress.   Nutritional Diagnosis:  NB-1.1 Food and nutrition-related knowledge deficit As related to Diabetes.  As evidenced by  A1C 6.6%..    Intervention:  Nutrition Nutrition and Diabetes education provided on My Plate, CHO counting, meal planning, portion sizes, timing of meals, avoiding snacks between meals unless having a low blood sugar, target ranges for A1C and blood sugars, signs/symptoms and treatment of hyper/hypoglycemia, monitoring blood sugars, taking medications as prescribed, benefits of exercising 30 minutes per day and prevention of complications of DM. HIgh Fiber Foods.  Goals Eat breakfast daily- Don't skip meals Eat eggs and toast for breakfast and a sandwich  and fruit for lunch daily. Increase fresh fruits and vegetables Drink more water. Lose 5 lbs in the next 3 months.  Teaching Method Utilized:  Visual Auditory Hands on  Handouts given during visit include:  The Plate Method   Meal Plan Card  Diabetes Instructions     Barriers to learning/adherence to lifestyle change: CVA, HA, Limited mobility.  Demonstrated degree of understanding via:  Teach Back   Monitoring/Evaluation:  Dietary intake, exercise, , and body weight in 3 month(s).Marland Kitchen

## 2019-05-30 ENCOUNTER — Ambulatory Visit (HOSPITAL_COMMUNITY): Payer: Medicare Other | Attending: Family Medicine | Admitting: Occupational Therapy

## 2019-05-30 ENCOUNTER — Encounter (HOSPITAL_COMMUNITY): Payer: Self-pay | Admitting: Occupational Therapy

## 2019-05-30 ENCOUNTER — Other Ambulatory Visit: Payer: Self-pay

## 2019-05-30 DIAGNOSIS — M25612 Stiffness of left shoulder, not elsewhere classified: Secondary | ICD-10-CM | POA: Diagnosis not present

## 2019-05-30 DIAGNOSIS — R29898 Other symptoms and signs involving the musculoskeletal system: Secondary | ICD-10-CM | POA: Insufficient documentation

## 2019-05-30 DIAGNOSIS — M25512 Pain in left shoulder: Secondary | ICD-10-CM | POA: Insufficient documentation

## 2019-05-30 DIAGNOSIS — G8929 Other chronic pain: Secondary | ICD-10-CM

## 2019-05-30 NOTE — Therapy (Signed)
[place], [place], [address] Phone: [phone]   Fax:  [phone]  Occupational Therapy Evaluation  Patient Details  Name: [name] MRN: [id number] Date of Birth: [birth year] Referring Provider (OT): Dr. Sallee Lange   Encounter Date: 05/30/2019  OT End of Session - 05/30/19 1445    Visit Number  1    Number of Visits  8    Date for OT Re-Evaluation  06/29/19    Authorization Type  1) UHC Medicare 2) Medicaid; no visit limit, no co-pay    Authorization Time Period  progress note on visit 8    Authorization - Visit Number  1    Authorization - Number of Visits  8    OT Start Time  1421    OT Stop Time  1454    OT Time Calculation (min)  33 min    Activity Tolerance  Patient tolerated treatment well    Behavior During Therapy  [facility] for tasks assessed/performed       Past Medical History:  Diagnosis Date  . Anxiety   . Diabetes mellitus   . Diabetes mellitus, type II ([place])   . HTN (hypertension)   . Hyperglycemia   . IBS (irritable bowel syndrome)   . Lung nodules    right, followed by PCP, PET 11/2011  . Mental retardation   . MI (myocardial infarction) ([place])   . Migraines   . Sleep apnea     Past Surgical History:  Procedure Laterality Date  . ABDOMINAL HYSTERECTOMY    . COLONOSCOPY  11/2007   hyperplastic polyps, prior hx of adenomas   . COLONOSCOPY  05/2010   incomplete due to poor prep, hyperplastic rectal polyp  . COLONOSCOPY  05/05/2002   Dimunitive polyps in the rectum and left colon, cold    biopsied/removed.  Scattered few left-sided diverticula.  Regular colonic   mucosa appeared normal  . COLONOSCOPY N/A 05/28/2013   Rourk: mulitple tubular adenomas removed. next tcs 05/2016  . COLONOSCOPY WITH PROPOFOL N/A 09/07/2016   Dr. Gala Romney: For hyperplastic polyps removed. Next colonoscopy March 2023 given history of adenomatous colon polyps in the past.  . ESOPHAGOGASTRODUODENOSCOPY   08/2007   moderate sized hiatal hernia  . ESOPHAGOGASTRODUODENOSCOPY  05/2010   noncritical schatzki ring s/p 38F  . ESOPHAGOGASTRODUODENOSCOPY (EGD) WITH ESOPHAGEAL DILATION N/A 02/06/2013   XLK:GMWNUU esophagus-s/p dilation up to a 62 [place] size with [facility] dilators.  Hiatal hernia  . ESOPHAGOGASTRODUODENOSCOPY (EGD) WITH PROPOFOL N/A 09/07/2016   Dr. Gala Romney: Normal, status post empiric dilation of the esophagus for history of dysphagia  . ESOPHAGOGASTRODUODENOSCOPY (EGD) WITH PROPOFOL N/A 05/02/2019   Procedure: ESOPHAGOGASTRODUODENOSCOPY (EGD) WITH PROPOFOL;  Surgeon: Daneil Dolin, MD;  [facility];  Service: Endoscopy;  Laterality: N/A;  1:30pm  . EXTERNAL EAR SURGERY     bilateral  . FOOT SURGERY    . GLAUCOMA SURGERY    . MALONEY DILATION N/A 09/07/2016   Procedure: Venia Minks DILATION;  Surgeon: Daneil Dolin, MD;  [facility];  Service: Endoscopy;  Laterality: N/A;  . Venia Minks DILATION N/A 05/02/2019   Procedure: Venia Minks DILATION;  Surgeon: Daneil Dolin, MD;  [facility];  Service: Endoscopy;  Laterality: N/A;  . POLYPECTOMY  09/07/2016   Procedure: POLYPECTOMY;  Surgeon: Daneil Dolin, MD;  [facility];  Service: Endoscopy;;  sigmoid colon x4  . small bowel capsule  10/2007   normal  There were no vitals filed for this visit.  Subjective Assessment - 05/30/19 1428    Subjective   S: I fell in my bathtub recently.    Pertinent History  Pt is a 58 y/o female presenting with left shoulder pain present for over 6 months. Pt had x-ray completed, negative results for fx or dislocation. Pt was referred to occupational therapy for evaluation and treatment by Dr. Sallee Lange.    Limitations  cognitive difficulties    Special Tests  FOTO: 41/100    Patient Stated Goals  To have less pain in my shoulder.    Currently in Pain?  Yes    Pain Score  8     Pain Location  Shoulder    Pain Orientation  Left    Pain Descriptors / Indicators   Aching;Throbbing    Pain Type  Chronic pain    Pain Radiating Towards  n/a    Pain Onset  More than a month ago    Pain Frequency  Constant    Aggravating Factors   movement    Pain Relieving Factors  nothing    Effect of Pain on Daily Activities  mod effect on ADLs    Multiple Pain Sites  No        [facility] OT Assessment - 05/30/19 1420      Assessment   Medical Diagnosis  Left shoulder pain    Referring Provider (OT)  Dr. Sallee Lange    Onset Date/Surgical Date  --   a few months   Hand Dominance  Left    Next MD Visit  not scheduled    Prior Therapy  none      Precautions   Precautions  Fall    Precaution Comments  frequent falls      Restrictions   Weight Bearing Restrictions  No      Balance Screen   Has the patient fallen in the past 6 months  Yes    How many times?  more than 50    Has the patient had a decrease in activity level because of a fear of falling?   Yes    Is the patient reluctant to leave their home because of a fear of falling?   Yes      Prior Function   Level of Independence  Needs assistance with ADLs    Vocation  On disability    Leisure  watching tv, reading      ADL   ADL comments  Pt is having difficulty putting jacket and pants on, sleeping, reaching up and behind back, lifting and carrying items, pulling up to get up from chair or bathtub      Written Expression   Dominant Hand  Left      Cognition   Overall Cognitive Status  Within Functional Limits for tasks assessed      Observation/Other Assessments   Focus on Therapeutic Outcomes (FOTO)   41/100      ROM / Strength   AROM / PROM / Strength  AROM;PROM;Strength      Palpation   Palpation comment  Pt with mod fascial restrictions along upper arm and anterior shoulder regions. Very tender to touch with mild edema in anterior shoulder      AROM   Overall AROM Comments  Assessed seated, er/IR adducted    AROM Assessment Site  Shoulder    Right/Left Shoulder  Left    Left  Shoulder Flexion  91 Degrees  Left Shoulder ABduction  64 Degrees    Left Shoulder Internal Rotation  90 Degrees    Left Shoulder External Rotation  50 Degrees      PROM   Overall PROM Comments  Assessed supine, er/IR adducted    PROM Assessment Site  Shoulder    Right/Left Shoulder  Left    Left Shoulder Flexion  100 Degrees    Left Shoulder ABduction  80 Degrees    Left Shoulder Internal Rotation  90 Degrees    Left Shoulder External Rotation  53 Degrees      Strength   Overall Strength Comments  Assessed seated, er/IR adducted    Strength Assessment Site  Shoulder    Right/Left Shoulder  Left    Left Shoulder Flexion  3-/5    Left Shoulder ABduction  3-/5    Left Shoulder Internal Rotation  3/5    Left Shoulder External Rotation  3-/5                      OT Education - 05/30/19 1444    Education Details  table slides    Person(s) Educated  Patient    Methods  Explanation;Demonstration;Handout;Verbal cues    Comprehension  Verbalized understanding;Returned demonstration       OT Short Term Goals - 05/30/19 1508      OT SHORT TERM GOAL #1   Title  Pt will be provided with and educated on HEP to improve mobility in LUE required for ADL completion.    Time  4    Period  Weeks    Status  New    Target Date  06/29/19      OT SHORT TERM GOAL #2   Title  Pt will decrease pain in LUE to 6/10 or less to improve ability to sleep.    Time  4    Period  Weeks    Status  New      OT SHORT TERM GOAL #3   Title  Pt will increase A/ROM of LUE to [facility] to improve ability to donn jackets and shirts independently.    Time  4    Period  Weeks    Status  New      OT SHORT TERM GOAL #4   Title  Pt will decrease LUE fascial restrictions to minimal amounts to improve mobility required for functional reaching tasks.    Time  4    Period  Weeks    Status  New      OT SHORT TERM GOAL #5   Title  Pt will increase LUE strength to 4/5 to improve ability to perform  reaching tasks overhead and behind back.    Time  4    Period  Weeks    Status  New               Plan - 05/30/19 1502    Clinical Impression Statement  A: Pt is a 58 y/o female presenting with left shoulder pain of unknown origin, present for greater than 6 months. Pt is very tender to palpation and passive motion during evaluation, mild edema noted in anterior shoulder region. Pt requiring verbal, visual, and occasional tactile cuing for correct completion of HEP. Discussed with pt if not making progress or unable to tolerate therapy may need to return to MD for further imaging.    OT Occupational Profile and History  Problem Focused Assessment - Including review of records relating to presenting  problem    Occupational performance deficits (Please refer to evaluation for details):  ADL's;IADL's;Rest and Sleep;Leisure    Body Structure / Function / Physical Skills  ADL;UE functional use;Fascial restriction;Pain;ROM;IADL;Strength;Edema    Rehab Potential  Good    Clinical Decision Making  Limited treatment options, no task modification necessary    Comorbidities Affecting Occupational Performance:  May have comorbidities impacting occupational performance    Modification or Assistance to Complete Evaluation   Min-Moderate modification of tasks or assist with assess necessary to complete eval    OT Frequency  2x / week    OT Duration  4 weeks    OT Treatment/Interventions  Self-care/ADL training;Ultrasound;Patient/family education;Cryotherapy;Electrical Stimulation;Moist Heat;Therapeutic exercise;Manual Therapy;Therapeutic activities    Plan  P: Pt will benefit from skilled OT services to decrease pain and fascial restrictions and increase ROM, strength, and functional use of LUE during ADL tasks. Treatment plan: myofasical release, manual techniques, P/ROM, AA/ROM, A/ROM, general LUE strengthening.    Consulted and Agree with Plan of Care  Patient       Patient will benefit from  skilled therapeutic intervention in order to improve the following deficits and impairments:   Body Structure / Function / Physical Skills: ADL, UE functional use, Fascial restriction, Pain, ROM, IADL, Strength, Edema       Visit Diagnosis: Chronic left shoulder pain  Stiffness of left shoulder, not elsewhere classified  Other symptoms and signs involving the musculoskeletal system    Problem List Patient Active Problem List   Diagnosis Date Noted  . Rhabdomyolysis 03/22/2019  . Acute metabolic encephalopathy 51/08/5850  . Fall in bathtub   . Neck pain   . Primary osteoarthritis of both knees 01/27/2019  . Hyperlipidemia 09/26/2017  . Dysphagia 08/15/2016  . Obstructive sleep apnea syndrome 06/27/2016  . Rectal bleeding 04/07/2013  . Melena 03/04/2013  . Hematemesis 03/04/2013  . Abdominal pain, epigastric 03/04/2013  . Hypokalemia 03/04/2013  . Smoker 01/16/2012  . Diarrhea 12/26/2011  . Pulmonary nodule, right 12/26/2011  . Abdominal pain 10/08/2011  . Fatty liver 08/24/2011  . Constipation 10/20/2010  . NAUSEA AND VOMITING 06/09/2010  . HEMATOCHEZIA 04/21/2010  . OTHER DYSPHAGIA 04/21/2010  . MILD MENTAL RETARDATION 02/17/2009  . GLAUCOMA 02/17/2009  . Morbid obesity ([place]) 01/15/2009  . ANXIETY 01/15/2009  . UNSPECIFIED MENTAL RETARDATION 01/15/2009  . MIGRAINE HEADACHE 01/15/2009  . Essential hypertension 01/15/2009  . HEMORRHOIDS 01/15/2009  . GASTROESOPHAGEAL REFLUX DISEASE, CHRONIC 01/15/2009  . CONSTIPATION, CHRONIC 01/15/2009  . IBS (irritable bowel syndrome) 01/15/2009  . KNEE PAIN, CHRONIC 01/15/2009  . BACK PAIN, CHRONIC 01/15/2009  . Hx of adenomatous colonic polyps 01/15/2009   Guadelupe Sabin, OTR/L  [phone] 05/30/2019, 3:10 PM  [place] [address] [place], [place], [address] Phone: [phone]   Fax:  [phone]  Name: [name] MRN: [id number] Date of Birth: 11/11/1960

## 2019-05-30 NOTE — Patient Instructions (Signed)
SHOULDER: Flexion On Table   Place hands on towel placed on table, elbows straight. Lean forward with you upper body, pushing towel away from body.  _15__ reps per set, _2-3__ sets per day  Abduction (Passive)   With arm out to side, resting on towel placed on table with palm DOWN, keeping trunk away from table, lean to the side while pushing towel away from body.  Repeat __15__ times. Do _2-3___ sessions per day.  Copyright  VHI. All rights reserved.     Internal Rotation (Assistive)   Seated with elbow bent at right angle and held against side, slide arm on table surface in an inward arc keeping elbow anchored in place. Repeat _15___ times. Do __2-3__ sessions per day. Activity: Use this motion to brush crumbs off the table.  Copyright  VHI. All rights reserved.

## 2019-06-09 ENCOUNTER — Ambulatory Visit (HOSPITAL_COMMUNITY): Payer: Medicare Other | Admitting: Specialist

## 2019-06-09 ENCOUNTER — Encounter (HOSPITAL_COMMUNITY): Payer: Self-pay | Admitting: Specialist

## 2019-06-09 ENCOUNTER — Other Ambulatory Visit: Payer: Self-pay

## 2019-06-09 DIAGNOSIS — M25612 Stiffness of left shoulder, not elsewhere classified: Secondary | ICD-10-CM | POA: Diagnosis not present

## 2019-06-09 DIAGNOSIS — G8929 Other chronic pain: Secondary | ICD-10-CM | POA: Diagnosis not present

## 2019-06-09 DIAGNOSIS — R29898 Other symptoms and signs involving the musculoskeletal system: Secondary | ICD-10-CM

## 2019-06-09 DIAGNOSIS — M25512 Pain in left shoulder: Secondary | ICD-10-CM | POA: Diagnosis not present

## 2019-06-09 NOTE — Therapy (Signed)
[place], [place], [address] Phone: [phone]   Fax:  [phone]  Occupational Therapy Treatment  Patient Details  Name: [name] MRN: [id number] Date of Birth: 04-Jun-1961 Referring Provider (OT): Dr. Sallee Lange   Encounter Date: 06/09/2019  OT End of Session - 06/09/19 1618    Visit Number  2    Number of Visits  8    Date for OT Re-Evaluation  06/29/19    Authorization Type  1) UHC Medicare 2) Medicaid; no visit limit, no co-pay    Authorization Time Period  progress note on visit 8    Authorization - Visit Number  2    Authorization - Number of Visits  8    OT Start Time  1350    OT Stop Time  1430    OT Time Calculation (min)  40 min    Activity Tolerance  Patient tolerated treatment well    Behavior During Therapy  [facility] for tasks assessed/performed       Past Medical History:  Diagnosis Date  . Anxiety   . Diabetes mellitus   . Diabetes mellitus, type II ([place])   . HTN (hypertension)   . Hyperglycemia   . IBS (irritable bowel syndrome)   . Lung nodules    right, followed by PCP, PET 11/2011  . Mental retardation   . MI (myocardial infarction) ([place])   . Migraines   . Sleep apnea     Past Surgical History:  Procedure Laterality Date  . ABDOMINAL HYSTERECTOMY    . COLONOSCOPY  11/2007   hyperplastic polyps, prior hx of adenomas   . COLONOSCOPY  05/2010   incomplete due to poor prep, hyperplastic rectal polyp  . COLONOSCOPY  05/05/2002   Dimunitive polyps in the rectum and left colon, cold    biopsied/removed.  Scattered few left-sided diverticula.  Regular colonic   mucosa appeared normal  . COLONOSCOPY N/A 05/28/2013   Rourk: mulitple tubular adenomas removed. next tcs 05/2016  . COLONOSCOPY WITH PROPOFOL N/A 09/07/2016   Dr. Gala Romney: For hyperplastic polyps removed. Next colonoscopy March 2023 given history of adenomatous colon polyps in the past.  . ESOPHAGOGASTRODUODENOSCOPY  08/2007    moderate sized hiatal hernia  . ESOPHAGOGASTRODUODENOSCOPY  05/2010   noncritical schatzki ring s/p 84F  . ESOPHAGOGASTRODUODENOSCOPY (EGD) WITH ESOPHAGEAL DILATION N/A 02/06/2013   FIE:PPIRJJ esophagus-s/p dilation up to a 18 [place] size with [facility] dilators.  Hiatal hernia  . ESOPHAGOGASTRODUODENOSCOPY (EGD) WITH PROPOFOL N/A 09/07/2016   Dr. Gala Romney: Normal, status post empiric dilation of the esophagus for history of dysphagia  . ESOPHAGOGASTRODUODENOSCOPY (EGD) WITH PROPOFOL N/A 05/02/2019   Procedure: ESOPHAGOGASTRODUODENOSCOPY (EGD) WITH PROPOFOL;  Surgeon: Daneil Dolin, MD;  [facility];  Service: Endoscopy;  Laterality: N/A;  1:30pm  . EXTERNAL EAR SURGERY     bilateral  . FOOT SURGERY    . GLAUCOMA SURGERY    . MALONEY DILATION N/A 09/07/2016   Procedure: Venia Minks DILATION;  Surgeon: Daneil Dolin, MD;  [facility];  Service: Endoscopy;  Laterality: N/A;  . Venia Minks DILATION N/A 05/02/2019   Procedure: Venia Minks DILATION;  Surgeon: Daneil Dolin, MD;  [facility];  Service: Endoscopy;  Laterality: N/A;  . POLYPECTOMY  09/07/2016   Procedure: POLYPECTOMY;  Surgeon: Daneil Dolin, MD;  [facility];  Service: Endoscopy;;  sigmoid colon x4  . small bowel capsule  10/2007   normal  There were no vitals filed for this visit.  Subjective Assessment - 06/09/19 1617    Subjective   S:  I have been hurting since Wednesday.  I still try to do my exercises.    Currently in Pain?  Yes    Pain Score  6     Pain Location  Shoulder    Pain Orientation  Left    Pain Descriptors / Indicators  Aching    Pain Type  Chronic pain         [facility] OT Assessment - 06/09/19 0001      Assessment   Medical Diagnosis  Left shoulder pain    Referring Provider (OT)  Dr. Sallee Lange      Precautions   Precautions  Fall    Precaution Comments  frequent falls               OT Treatments/Exercises (OP) - 06/09/19 0001      Exercises    Exercises  Shoulder      Shoulder Exercises: Supine   Protraction  PROM;10 reps;AAROM;5 reps    Horizontal ABduction  PROM;10 reps;AAROM;5 reps    External Rotation  PROM;10 reps;AAROM;5 reps    Internal Rotation  PROM;10 reps;AAROM;5 reps    Flexion  PROM;10 reps;AAROM;5 reps    ABduction  PROM;10 reps;AAROM;5 reps      Shoulder Exercises: Seated   Elevation  AROM;10 reps    Extension  AROM;10 reps    Row  AROM;10 reps    Protraction  AAROM;10 reps    External Rotation  AAROM;10 reps    Internal Rotation  AAROM;10 reps    Flexion  AAROM;10 reps    Abduction  AAROM;10 reps      Shoulder Exercises: Therapy Ball   Flexion  10 reps    ABduction  10 reps      Manual Therapy   Manual Therapy  Myofascial release    Manual therapy comments  manual therapy completed seperately from other interventions this date    Myofascial Release  myofascial release and manual stretching to decrease pain and restrictions and improve pain free mobiilty in left shoulder and associated areas               OT Education - 06/09/19 1618    Education Details  reviewed HEP, plan of care and goals with patient    Person(s) Educated  Patient    Methods  Explanation    Comprehension  Verbalized understanding       OT Short Term Goals - 06/09/19 1621      OT SHORT TERM GOAL #1   Title  Pt will be provided with and educated on HEP to improve mobility in LUE required for ADL completion.    Time  4    Period  Weeks    Status  On-going    Target Date  06/29/19      OT SHORT TERM GOAL #2   Title  Pt will decrease pain in LUE to 6/10 or less to improve ability to sleep.    Time  4    Period  Weeks    Status  On-going      OT SHORT TERM GOAL #3   Title  Pt will increase A/ROM of LUE to [facility] to improve ability to donn jackets and shirts independently.    Time  4    Period  Weeks    Status  On-going      OT  SHORT TERM GOAL #4   Title  Pt will decrease LUE fascial restrictions to minimal  amounts to improve mobility required for functional reaching tasks.    Time  4    Period  Weeks    Status  On-going      OT SHORT TERM GOAL #5   Title  Pt will increase LUE strength to 4/5 to improve ability to perform reaching tasks overhead and behind back.    Time  4    Period  Weeks    Status  On-going               Plan - 06/09/19 1619    Clinical Impression Statement  A:  Mrs. Lightle is able to complete aa/rom with max verbal guidance and moderate tactile cues for technique and positioning in both supine and seated plane, less difficulty in seated as opposed to supine.  Patient with significant discomfort during P/ROM at mid range and above.    Body Structure / Function / Physical Skills  ADL;UE functional use;Fascial restriction;Pain;ROM;IADL;Strength;Edema    Plan  P:  Attempt AA/ROM with less facilitation and greater available range in order to gain increased ease and independence with functional reaching.    Consulted and Agree with Plan of Care  Patient       Patient will benefit from skilled therapeutic intervention in order to improve the following deficits and impairments:   Body Structure / Function / Physical Skills: ADL, UE functional use, Fascial restriction, Pain, ROM, IADL, Strength, Edema       Visit Diagnosis: Chronic left shoulder pain  Stiffness of left shoulder, not elsewhere classified  Other symptoms and signs involving the musculoskeletal system    Problem List Patient Active Problem List   Diagnosis Date Noted  . Rhabdomyolysis 03/22/2019  . Acute metabolic encephalopathy 26/20/3559  . Fall in bathtub   . Neck pain   . Primary osteoarthritis of both knees 01/27/2019  . Hyperlipidemia 09/26/2017  . Dysphagia 08/15/2016  . Obstructive sleep apnea syndrome 06/27/2016  . Rectal bleeding 04/07/2013  . Melena 03/04/2013  . Hematemesis 03/04/2013  . Abdominal pain, epigastric 03/04/2013  . Hypokalemia 03/04/2013  . Smoker 01/16/2012  .  Diarrhea 12/26/2011  . Pulmonary nodule, right 12/26/2011  . Abdominal pain 10/08/2011  . Fatty liver 08/24/2011  . Constipation 10/20/2010  . NAUSEA AND VOMITING 06/09/2010  . HEMATOCHEZIA 04/21/2010  . OTHER DYSPHAGIA 04/21/2010  . MILD MENTAL RETARDATION 02/17/2009  . GLAUCOMA 02/17/2009  . Morbid obesity ([place]) 01/15/2009  . ANXIETY 01/15/2009  . UNSPECIFIED MENTAL RETARDATION 01/15/2009  . MIGRAINE HEADACHE 01/15/2009  . Essential hypertension 01/15/2009  . HEMORRHOIDS 01/15/2009  . GASTROESOPHAGEAL REFLUX DISEASE, CHRONIC 01/15/2009  . CONSTIPATION, CHRONIC 01/15/2009  . IBS (irritable bowel syndrome) 01/15/2009  . KNEE PAIN, CHRONIC 01/15/2009  . BACK PAIN, CHRONIC 01/15/2009  . Hx of adenomatous colonic polyps 01/15/2009    Vangie Bicker, [place], OTR/L [phone]  06/09/2019, 4:27 PM  [place] [address] [place], [place], [address] Phone: [phone]   Fax:  [phone]  Name: JACKY HARTUNG MRN: [id number] Date of Birth: 08/19/1960

## 2019-06-12 ENCOUNTER — Ambulatory Visit (HOSPITAL_COMMUNITY): Payer: Medicare Other | Attending: Family Medicine

## 2019-06-12 ENCOUNTER — Encounter (HOSPITAL_COMMUNITY): Payer: Self-pay

## 2019-06-12 ENCOUNTER — Other Ambulatory Visit: Payer: Self-pay

## 2019-06-12 DIAGNOSIS — R29898 Other symptoms and signs involving the musculoskeletal system: Secondary | ICD-10-CM | POA: Insufficient documentation

## 2019-06-12 DIAGNOSIS — G8929 Other chronic pain: Secondary | ICD-10-CM

## 2019-06-12 DIAGNOSIS — M25612 Stiffness of left shoulder, not elsewhere classified: Secondary | ICD-10-CM | POA: Diagnosis not present

## 2019-06-12 DIAGNOSIS — M25512 Pain in left shoulder: Secondary | ICD-10-CM | POA: Insufficient documentation

## 2019-06-12 NOTE — Therapy (Signed)
[place], [place], [address] Phone: [phone]   Fax:  [phone]  Occupational Therapy Treatment  Patient Details  Name: [name] MRN: [id number] Date of Birth: Sep 28, 1960 Referring Provider (OT): Dr. Sallee Lange   Encounter Date: 06/12/2019  OT End of Session - 06/12/19 1051    Visit Number  3    Number of Visits  8    Date for OT Re-Evaluation  06/29/19    Authorization Type  1) UHC Medicare 2) Medicaid; no visit limit, no co-pay    Authorization Time Period  progress note on visit 8    Authorization - Visit Number  3    Authorization - Number of Visits  8    OT Start Time  0900    OT Stop Time  0938    OT Time Calculation (min)  38 min    Activity Tolerance  Patient tolerated treatment well    Behavior During Therapy  [facility] for tasks assessed/performed       Past Medical History:  Diagnosis Date  . Anxiety   . Diabetes mellitus   . Diabetes mellitus, type II ([place])   . HTN (hypertension)   . Hyperglycemia   . IBS (irritable bowel syndrome)   . Lung nodules    right, followed by PCP, PET 11/2011  . Mental retardation   . MI (myocardial infarction) ([place])   . Migraines   . Sleep apnea     Past Surgical History:  Procedure Laterality Date  . ABDOMINAL HYSTERECTOMY    . COLONOSCOPY  11/2007   hyperplastic polyps, prior hx of adenomas   . COLONOSCOPY  05/2010   incomplete due to poor prep, hyperplastic rectal polyp  . COLONOSCOPY  05/05/2002   Dimunitive polyps in the rectum and left colon, cold    biopsied/removed.  Scattered few left-sided diverticula.  Regular colonic   mucosa appeared normal  . COLONOSCOPY N/A 05/28/2013   Rourk: mulitple tubular adenomas removed. next tcs 05/2016  . COLONOSCOPY WITH PROPOFOL N/A 09/07/2016   Dr. Gala Romney: For hyperplastic polyps removed. Next colonoscopy March 2023 given history of adenomatous colon polyps in the past.  . ESOPHAGOGASTRODUODENOSCOPY  08/2007    moderate sized hiatal hernia  . ESOPHAGOGASTRODUODENOSCOPY  05/2010   noncritical schatzki ring s/p 86F  . ESOPHAGOGASTRODUODENOSCOPY (EGD) WITH ESOPHAGEAL DILATION N/A 02/06/2013   [id number] esophagus-s/p dilation up to a 21 [place] size with [facility] dilators.  Hiatal hernia  . ESOPHAGOGASTRODUODENOSCOPY (EGD) WITH PROPOFOL N/A 09/07/2016   Dr. Gala Romney: Normal, status post empiric dilation of the esophagus for history of dysphagia  . ESOPHAGOGASTRODUODENOSCOPY (EGD) WITH PROPOFOL N/A 05/02/2019   Procedure: ESOPHAGOGASTRODUODENOSCOPY (EGD) WITH PROPOFOL;  Surgeon: Daneil Dolin, MD;  [facility];  Service: Endoscopy;  Laterality: N/A;  1:30pm  . EXTERNAL EAR SURGERY     bilateral  . FOOT SURGERY    . GLAUCOMA SURGERY    . MALONEY DILATION N/A 09/07/2016   Procedure: Venia Minks DILATION;  Surgeon: Daneil Dolin, MD;  Location: [facility];  Service: Endoscopy;  Laterality: N/A;  . Venia Minks DILATION N/A 05/02/2019   Procedure: Venia Minks DILATION;  Surgeon: Daneil Dolin, MD;  Location: [facility];  Service: Endoscopy;  Laterality: N/A;  . POLYPECTOMY  09/07/2016   Procedure: POLYPECTOMY;  Surgeon: Daneil Dolin, MD;  Location: [facility];  Service: Endoscopy;;  sigmoid colon x4  . small bowel capsule  10/2007   normal  There were no vitals filed for this visit.  Subjective Assessment - 06/12/19 1049    Subjective   S: It hurts a lot today.    Currently in Pain?  Yes    Pain Score  9     Pain Location  Shoulder    Pain Orientation  Left    Pain Descriptors / Indicators  Aching;Sore    Pain Type  Chronic pain    Pain Radiating Towards  N/A    Pain Onset  More than a month ago    Pain Frequency  Constant    Aggravating Factors   Movement    Pain Relieving Factors  ES and heat helped bring pain down to 8/10.    Effect of Pain on Daily Activities  max effect         [facility] OT Assessment - 06/12/19 0935      Assessment   Medical Diagnosis  Left shoulder pain     Referring Provider (OT)  Dr. Sallee Lange      Precautions   Precautions  Fall;Other (comment)    Precaution Comments  frequent falls, cognitive deficits               OT Treatments/Exercises (OP) - 06/12/19 0935      Exercises   Exercises  Shoulder      Shoulder Exercises: ROM/Strengthening   Wall Wash  1'      Functional Reaching Activities   High Level  10 cones placed on tallest shelf using LUE. Removed in the same fashion and placed on counter.       Modalities   Modalities  Electrical Stimulation;Moist Heat      Moist Heat Therapy   Number Minutes Moist Heat  10 Minutes    Moist Heat Location  Shoulder      Electrical Stimulation   Electrical Stimulation Location  left shoulder    Electrical Stimulation Action  interferential    Electrical Stimulation Parameters  3.4 CV; 10'    Electrical Stimulation Goals  Pain               OT Short Term Goals - 06/09/19 1621      OT SHORT TERM GOAL #1   Title  Pt will be provided with and educated on HEP to improve mobility in LUE required for ADL completion.    Time  4    Period  Weeks    Status  On-going    Target Date  06/29/19      OT SHORT TERM GOAL #2   Title  Pt will decrease pain in LUE to 6/10 or less to improve ability to sleep.    Time  4    Period  Weeks    Status  On-going      OT SHORT TERM GOAL #3   Title  Pt will increase A/ROM of LUE to [facility] to improve ability to donn jackets and shirts independently.    Time  4    Period  Weeks    Status  On-going      OT SHORT TERM GOAL #4   Title  Pt will decrease LUE fascial restrictions to minimal amounts to improve mobility required for functional reaching tasks.    Time  4    Period  Weeks    Status  On-going      OT SHORT TERM GOAL #5   Title  Pt will increase LUE strength to 4/5 to improve ability to perform  reaching tasks overhead and behind back.    Time  4    Period  Weeks    Status  On-going               Plan - 06/12/19  1052    Clinical Impression Statement  A: Pt unable to tolerate manual techniques to address fascial restrictions and ES with moist heat was used. pt reports that pain decreased from 9 to 8/10 with use. Attempted passive ROM stretching with patient displaying maximum muscle guarding and unable to relax. Passive stretching was stopped. Attempted AA/ROM supine with patient requiring max verbal cueing for form and technique. Attempted to provide patient with visual target for correct form. Patient was still unable to complete. Due to history of cognitive deficits, treatment approach was switched to more functional daily movements that would be familiar versus exercises. Pt was able to complete wall wash and functional reaching into the cabinet without difficulty and displayed full functional ROM. Educated patient on placing left arm in first when donning jacket due to pain in order to increase independence and patient was able to demonstrate.    Body Structure / Function / Physical Skills  ADL;UE functional use;Fascial restriction;Pain;ROM;IADL;Strength;Edema    Plan  P: Focus session more on functional daily movements that are familiar to patient versus "exercises." HEP will need to be updated. patient is able to demonstrate full A/ROM supine and standing although will not relax for passive stretching. May need to d/c passive stretching.Complete functional reaching task again in kitchen cabinet    Consulted and Agree with Plan of Care  Patient       Patient will benefit from skilled therapeutic intervention in order to improve the following deficits and impairments:   Body Structure / Function / Physical Skills: ADL, UE functional use, Fascial restriction, Pain, ROM, IADL, Strength, Edema       Visit Diagnosis: Chronic left shoulder pain  Stiffness of left shoulder, not elsewhere classified  Other symptoms and signs involving the musculoskeletal system    Problem List Patient Active Problem  List   Diagnosis Date Noted  . Rhabdomyolysis 03/22/2019  . Acute metabolic encephalopathy 39/09/90  . Fall in bathtub   . Neck pain   . Primary osteoarthritis of both knees 01/27/2019  . Hyperlipidemia 09/26/2017  . Dysphagia 08/15/2016  . Obstructive sleep apnea syndrome 06/27/2016  . Rectal bleeding 04/07/2013  . Melena 03/04/2013  . Hematemesis 03/04/2013  . Abdominal pain, epigastric 03/04/2013  . Hypokalemia 03/04/2013  . Smoker 01/16/2012  . Diarrhea 12/26/2011  . Pulmonary nodule, right 12/26/2011  . Abdominal pain 10/08/2011  . Fatty liver 08/24/2011  . Constipation 10/20/2010  . NAUSEA AND VOMITING 06/09/2010  . HEMATOCHEZIA 04/21/2010  . OTHER DYSPHAGIA 04/21/2010  . MILD MENTAL RETARDATION 02/17/2009  . GLAUCOMA 02/17/2009  . Morbid obesity ([place]) 01/15/2009  . ANXIETY 01/15/2009  . UNSPECIFIED MENTAL RETARDATION 01/15/2009  . MIGRAINE HEADACHE 01/15/2009  . Essential hypertension 01/15/2009  . HEMORRHOIDS 01/15/2009  . GASTROESOPHAGEAL REFLUX DISEASE, CHRONIC 01/15/2009  . CONSTIPATION, CHRONIC 01/15/2009  . IBS (irritable bowel syndrome) 01/15/2009  . KNEE PAIN, CHRONIC 01/15/2009  . BACK PAIN, CHRONIC 01/15/2009  . Hx of adenomatous colonic polyps 01/15/2009   Ailene Ravel, OTR/L,CBIS  [phone]  06/12/2019, 10:58 AM  [place] [address] [place], [place], [address] Phone: [phone]   Fax:  [phone]  Name: [name] MRN: [id number] Date of Birth: 1960-07-11

## 2019-06-16 ENCOUNTER — Other Ambulatory Visit: Payer: Self-pay | Admitting: Pharmacist

## 2019-06-16 NOTE — Patient Outreach (Signed)
[facility]) Care Management  06/16/2019  [name] 07/13/1960 [id number]   Patient was called to follow up on pill packs. HIPAA identifiers were obtained. Patient confirmed she has been receiving her pill packs and was not having any medication issues.  [facility] and confirmed the patient has been able to get all of her medications filled via pill packing.  Patient's medications were reviewed with Pharmacy Technician at [facility]:  Medications Reviewed Today    Reviewed by Elayne Guerin, [facility] (Pharmacist) on 06/16/19 at 1142  Med List Status: <None>  Medication Order Taking? Sig Documenting Provider Last Dose Status Informant  atorvastatin (LIPITOR) 10 MG tablet [id number] Yes Take 1 tablet (10 mg total) by mouth daily. Start 04/01/19 Orson Eva, MD Taking Active Self           Med Note Ivor Reining Apr 24, 2019 11:36 AM)    benztropine (COGENTIN) 1 MG tablet [id number] Yes Take 1 tablet (1 mg total) by mouth daily. Cloria Spring, MD Taking Active   dicyclomine (BENTYL) 10 MG capsule [id number] Yes May use one up to 3 times a day prn loose BM  Patient taking differently: Take 10 mg by mouth 4 (four) times daily -  before meals and at bedtime. For loose stools, hold for constipation   Kathyrn Drown, MD Taking Active Self  hydrocortisone (ANUSOL-HC) 2.5 % rectal cream [id number] Yes Place 1 application rectally 2 (two) times daily. Annitta Needs, NP Taking Active Self  lamoTRIgine (LAMICTAL) 100 MG tablet [id number] Yes Take 1 tablet (100 mg total) by mouth 2 (two) times daily. Cloria Spring, MD Taking Active   lisinopril (ZESTRIL) 2.5 MG tablet [id number] Yes Take 2.5 mg by mouth daily. [provider] Taking Active Self  paliperidone (INVEGA) 9 MG 24 hr tablet [id number] Yes Take 1 tablet (9 mg total) by mouth daily. Cloria Spring, MD Taking Active   polyethylene glycol (MIRALAX / GLYCOLAX) 17 g packet [id number] Yes Take 17 g by mouth daily as  needed for mild constipation. [provider] Taking Active Self  potassium chloride (K-DUR) 10 MEQ tablet [id number] Yes Take 1 tablet (10 mEq total) by mouth 2 (two) times daily. Kathyrn Drown, MD Taking Active Self  propranolol (INDERAL) 10 MG tablet [id number] Yes Take 10 mg by mouth 3 (three) times daily. [provider] Taking Active Self  RABEprazole (ACIPHEX) 20 MG tablet [id number] Yes TAKE 1 TABLET BY MOUTH TWICE A DAY.  Patient taking differently: Take 20 mg by mouth 2 (two) times daily.    Mahala Menghini, PA-C Taking Active Self  sertraline (ZOLOFT) 100 MG tablet [id number] Yes Take 1 tablet (100 mg total) by mouth daily. Cloria Spring, MD Taking Active   torsemide (DEMADEX) 20 MG tablet [id number] Yes TAKE (1) TABLET BY MOUTH EACH MORNING.  Patient taking differently: Take 20 mg by mouth daily.    Kathyrn Drown, MD Taking Active Self  traZODone (DESYREL) 100 MG tablet [id number] Yes TAKE 2 TABLETS(200MG ) BY MOUTH AT BEDTIME. Cloria Spring, MD Taking Active          Plan: Close patient's case as she was referred for an issue with her pill packs and that has been resolved.  Patient has an in-home aid that helps with her medications. She also has a Education officer, museum at [employer] who is responsible for her well-being due to psych issues.  Will gladly reopen  the patient's case upon request.  Elayne Guerin, PharmD, [facility] Clinical Pharmacist [phone]

## 2019-06-17 ENCOUNTER — Other Ambulatory Visit: Payer: Self-pay

## 2019-06-17 ENCOUNTER — Encounter (HOSPITAL_COMMUNITY): Payer: Self-pay | Admitting: Occupational Therapy

## 2019-06-17 ENCOUNTER — Ambulatory Visit: Payer: Medicare Other | Admitting: Family Medicine

## 2019-06-17 ENCOUNTER — Ambulatory Visit (HOSPITAL_COMMUNITY): Payer: Medicare Other | Admitting: Occupational Therapy

## 2019-06-17 DIAGNOSIS — M25512 Pain in left shoulder: Secondary | ICD-10-CM | POA: Diagnosis not present

## 2019-06-17 DIAGNOSIS — M25612 Stiffness of left shoulder, not elsewhere classified: Secondary | ICD-10-CM | POA: Diagnosis not present

## 2019-06-17 DIAGNOSIS — G8929 Other chronic pain: Secondary | ICD-10-CM

## 2019-06-17 DIAGNOSIS — R29898 Other symptoms and signs involving the musculoskeletal system: Secondary | ICD-10-CM | POA: Diagnosis not present

## 2019-06-17 NOTE — Therapy (Signed)
[place], [place], [address] Phone: [phone]   Fax:  [phone]  Occupational Therapy Treatment  Patient Details  Name: [name] MRN: [id number] Date of Birth: 03-20-1961 Referring Provider (OT): Dr. Sallee Lange   Encounter Date: 06/17/2019  OT End of Session - 06/17/19 1651    Visit Number  4    Number of Visits  8    Date for OT Re-Evaluation  06/29/19    Authorization Type  1) UHC Medicare 2) Medicaid; no visit limit, no co-pay    Authorization Time Period  progress note on visit 8    Authorization - Visit Number  4    Authorization - Number of Visits  8    OT Start Time  1610    OT Stop Time  1649    OT Time Calculation (min)  39 min    Activity Tolerance  Patient tolerated treatment well    Behavior During Therapy  [facility] for tasks assessed/performed       Past Medical History:  Diagnosis Date  . Anxiety   . Diabetes mellitus   . Diabetes mellitus, type II ([place])   . HTN (hypertension)   . Hyperglycemia   . IBS (irritable bowel syndrome)   . Lung nodules    right, followed by PCP, PET 11/2011  . Mental retardation   . MI (myocardial infarction) ([place])   . Migraines   . Sleep apnea     Past Surgical History:  Procedure Laterality Date  . ABDOMINAL HYSTERECTOMY    . COLONOSCOPY  11/2007   hyperplastic polyps, prior hx of adenomas   . COLONOSCOPY  05/2010   incomplete due to poor prep, hyperplastic rectal polyp  . COLONOSCOPY  05/05/2002   Dimunitive polyps in the rectum and left colon, cold    biopsied/removed.  Scattered few left-sided diverticula.  Regular colonic   mucosa appeared normal  . COLONOSCOPY N/A 05/28/2013   Rourk: mulitple tubular adenomas removed. next tcs 05/2016  . COLONOSCOPY WITH PROPOFOL N/A 09/07/2016   Dr. Gala Romney: For hyperplastic polyps removed. Next colonoscopy March 2023 given history of adenomatous colon polyps in the past.  . ESOPHAGOGASTRODUODENOSCOPY  08/2007    moderate sized hiatal hernia  . ESOPHAGOGASTRODUODENOSCOPY  05/2010   noncritical schatzki ring s/p 60F  . ESOPHAGOGASTRODUODENOSCOPY (EGD) WITH ESOPHAGEAL DILATION N/A 02/06/2013   ONG:EXBMWU esophagus-s/p dilation up to a 21 [place] size with [facility] dilators.  Hiatal hernia  . ESOPHAGOGASTRODUODENOSCOPY (EGD) WITH PROPOFOL N/A 09/07/2016   Dr. Gala Romney: Normal, status post empiric dilation of the esophagus for history of dysphagia  . ESOPHAGOGASTRODUODENOSCOPY (EGD) WITH PROPOFOL N/A 05/02/2019   Procedure: ESOPHAGOGASTRODUODENOSCOPY (EGD) WITH PROPOFOL;  Surgeon: Daneil Dolin, MD;  [facility];  Service: Endoscopy;  Laterality: N/A;  1:30pm  . EXTERNAL EAR SURGERY     bilateral  . FOOT SURGERY    . GLAUCOMA SURGERY    . MALONEY DILATION N/A 09/07/2016   Procedure: Venia Minks DILATION;  Surgeon: Daneil Dolin, MD;  [facility];  Service: Endoscopy;  Laterality: N/A;  . Venia Minks DILATION N/A 05/02/2019   Procedure: Venia Minks DILATION;  Surgeon: Daneil Dolin, MD;  [facility];  Service: Endoscopy;  Laterality: N/A;  . POLYPECTOMY  09/07/2016   Procedure: POLYPECTOMY;  Surgeon: Daneil Dolin, MD;  [facility];  Service: Endoscopy;;  sigmoid colon x4  . small bowel capsule  10/2007   normal  There were no vitals filed for this visit.  Subjective Assessment - 06/17/19 1612    Subjective   S: yesterday was terrible.    Currently in Pain?  Yes    Pain Score  7     Pain Location  Shoulder    Pain Orientation  Left    Pain Descriptors / Indicators  Aching;Sore    Pain Type  Chronic pain    Pain Radiating Towards  N/A    Pain Onset  More than a month ago    Pain Frequency  Constant    Aggravating Factors   movement    Pain Relieving Factors  ES and heat    Effect of Pain on Daily Activities  max effect on ADLs    Multiple Pain Sites  No         [facility] OT Assessment - 06/17/19 1610      Assessment   Medical Diagnosis  Left shoulder pain     Referring Provider (OT)  Dr. Sallee Lange      Precautions   Precautions  Fall;Other (comment)    Precaution Comments  frequent falls, cognitive deficits               OT Treatments/Exercises (OP) - 06/17/19 1613      Exercises   Exercises  Shoulder      Shoulder Exercises: Seated   Elevation  AROM;10 reps    Extension  AROM;10 reps    Row  AROM;10 reps    Protraction  AAROM;10 reps    Horizontal ABduction  AROM;10 reps    External Rotation  AAROM;AROM;10 reps    Internal Rotation  AAROM;AROM;10 reps    Flexion  AAROM;10 reps    Abduction  AROM;10 reps      Shoulder Exercises: ROM/Strengthening   UBE (Upper Arm Bike)  Level 1 2' forward 2' reverse   pace: 5-6.0   Wall Wash  1'      Functional Reaching Activities   Mid Level  Pt placed 10 cones on middle shelf of cabinet in flexion, removed in abduction, min difficulty    High Level  Pt placed and removed 10 cones from top shelf of overhead cabinet, min difficulty. Pt placed 28 clothespins along vertical bar of pinch tree, min difficulty.                OT Short Term Goals - 06/09/19 1621      OT SHORT TERM GOAL #1   Title  Pt will be provided with and educated on HEP to improve mobility in LUE required for ADL completion.    Time  4    Period  Weeks    Status  On-going    Target Date  06/29/19      OT SHORT TERM GOAL #2   Title  Pt will decrease pain in LUE to 6/10 or less to improve ability to sleep.    Time  4    Period  Weeks    Status  On-going      OT SHORT TERM GOAL #3   Title  Pt will increase A/ROM of LUE to [facility] to improve ability to donn jackets and shirts independently.    Time  4    Period  Weeks    Status  On-going      OT SHORT TERM GOAL #4   Title  Pt will decrease LUE fascial restrictions to minimal amounts to improve mobility required for functional reaching tasks.  Time  4    Period  Weeks    Status  On-going      OT SHORT TERM GOAL #5   Title  Pt will increase LUE  strength to 4/5 to improve ability to perform reaching tasks overhead and behind back.    Time  4    Period  Weeks    Status  On-going               Plan - 06/17/19 1652    Clinical Impression Statement  A: Did not complete manual techniques or passive stretching as pt is guarded and unable to tolerate. Completed AA/ROM and A/ROM with visual demonstration and verbal cuing for form. Focused remainder of session on functional reaching and functional task completion. Pt has ROM WFL during functional reaching, no increase in pain during tasks. Added UBE this session, pt enjoyed and tolerated well. Verbal and visual cuing throughout session for form and technique.    Body Structure / Function / Physical Skills  ADL;UE functional use;Fascial restriction;Pain;ROM;IADL;Strength;Edema    Plan  P: Continue with functional task completion, perform seated A/ROM exercises with visual demonstration       Patient will benefit from skilled therapeutic intervention in order to improve the following deficits and impairments:   Body Structure / Function / Physical Skills: ADL, UE functional use, Fascial restriction, Pain, ROM, IADL, Strength, Edema       Visit Diagnosis: Chronic left shoulder pain  Stiffness of left shoulder, not elsewhere classified  Other symptoms and signs involving the musculoskeletal system    Problem List Patient Active Problem List   Diagnosis Date Noted  . Rhabdomyolysis 03/22/2019  . Acute metabolic encephalopathy 62/70/3500  . Fall in bathtub   . Neck pain   . Primary osteoarthritis of both knees 01/27/2019  . Hyperlipidemia 09/26/2017  . Dysphagia 08/15/2016  . Obstructive sleep apnea syndrome 06/27/2016  . Rectal bleeding 04/07/2013  . Melena 03/04/2013  . Hematemesis 03/04/2013  . Abdominal pain, epigastric 03/04/2013  . Hypokalemia 03/04/2013  . Smoker 01/16/2012  . Diarrhea 12/26/2011  . Pulmonary nodule, right 12/26/2011  . Abdominal pain  10/08/2011  . Fatty liver 08/24/2011  . Constipation 10/20/2010  . NAUSEA AND VOMITING 06/09/2010  . HEMATOCHEZIA 04/21/2010  . OTHER DYSPHAGIA 04/21/2010  . MILD MENTAL RETARDATION 02/17/2009  . GLAUCOMA 02/17/2009  . Morbid obesity ([place]) 01/15/2009  . ANXIETY 01/15/2009  . UNSPECIFIED MENTAL RETARDATION 01/15/2009  . MIGRAINE HEADACHE 01/15/2009  . Essential hypertension 01/15/2009  . HEMORRHOIDS 01/15/2009  . GASTROESOPHAGEAL REFLUX DISEASE, CHRONIC 01/15/2009  . CONSTIPATION, CHRONIC 01/15/2009  . IBS (irritable bowel syndrome) 01/15/2009  . KNEE PAIN, CHRONIC 01/15/2009  . BACK PAIN, CHRONIC 01/15/2009  . Hx of adenomatous colonic polyps 01/15/2009   Guadelupe Sabin, OTR/L  [phone] 06/17/2019, 4:54 PM  [facility] [address] [place], [place], [address] Phone: [phone]   Fax:  [phone]  Name: [name] MRN: [id number] Date of Birth: Feb 04, 1961

## 2019-06-18 ENCOUNTER — Other Ambulatory Visit: Payer: Self-pay | Admitting: Family Medicine

## 2019-06-18 ENCOUNTER — Other Ambulatory Visit: Payer: Self-pay | Admitting: Gastroenterology

## 2019-06-19 ENCOUNTER — Telehealth: Payer: Self-pay | Admitting: Family Medicine

## 2019-06-19 ENCOUNTER — Other Ambulatory Visit: Payer: Self-pay | Admitting: Family Medicine

## 2019-06-19 ENCOUNTER — Encounter (HOSPITAL_COMMUNITY): Payer: Self-pay | Admitting: Occupational Therapy

## 2019-06-19 ENCOUNTER — Encounter: Payer: Self-pay | Admitting: Family Medicine

## 2019-06-19 ENCOUNTER — Ambulatory Visit (INDEPENDENT_AMBULATORY_CARE_PROVIDER_SITE_OTHER): Payer: Medicare Other | Admitting: Family Medicine

## 2019-06-19 ENCOUNTER — Ambulatory Visit (HOSPITAL_COMMUNITY): Payer: Medicare Other | Admitting: Occupational Therapy

## 2019-06-19 ENCOUNTER — Encounter: Payer: Self-pay | Admitting: Gastroenterology

## 2019-06-19 ENCOUNTER — Other Ambulatory Visit: Payer: Self-pay

## 2019-06-19 ENCOUNTER — Ambulatory Visit (INDEPENDENT_AMBULATORY_CARE_PROVIDER_SITE_OTHER): Payer: Medicare Other | Admitting: Gastroenterology

## 2019-06-19 VITALS — BP 116/74 | Temp 95.0°F | Wt 232.6 lb

## 2019-06-19 VITALS — BP 129/78 | HR 88 | Temp 96.9°F | Ht 69.0 in | Wt 239.2 lb

## 2019-06-19 DIAGNOSIS — R1310 Dysphagia, unspecified: Secondary | ICD-10-CM

## 2019-06-19 DIAGNOSIS — M25612 Stiffness of left shoulder, not elsewhere classified: Secondary | ICD-10-CM | POA: Diagnosis not present

## 2019-06-19 DIAGNOSIS — I10 Essential (primary) hypertension: Secondary | ICD-10-CM | POA: Diagnosis not present

## 2019-06-19 DIAGNOSIS — R270 Ataxia, unspecified: Secondary | ICD-10-CM

## 2019-06-19 DIAGNOSIS — R296 Repeated falls: Secondary | ICD-10-CM | POA: Diagnosis not present

## 2019-06-19 DIAGNOSIS — R29898 Other symptoms and signs involving the musculoskeletal system: Secondary | ICD-10-CM | POA: Diagnosis not present

## 2019-06-19 DIAGNOSIS — G8929 Other chronic pain: Secondary | ICD-10-CM

## 2019-06-19 DIAGNOSIS — M25512 Pain in left shoulder: Secondary | ICD-10-CM | POA: Diagnosis not present

## 2019-06-19 MED ORDER — DICYCLOMINE HCL 10 MG PO CAPS: ORAL_CAPSULE | ORAL | 5 refills | Status: DC

## 2019-06-19 MED ORDER — DICYCLOMINE HCL 10 MG PO CAPS: ORAL_CAPSULE | ORAL | 3 refills | Status: DC

## 2019-06-19 NOTE — Patient Instructions (Signed)
We are arranging a special xray for your esophagus in the near future.  Make sure you are sitting upright while eating and stay sitting up straight for 2 hours after meals.  We will see you in 4-6 months!  I enjoyed seeing you again today! As you know, I value our relationship and want to provide genuine, compassionate, and quality care. I welcome your feedback. If you receive a survey regarding your visit,  I greatly appreciate you taking time to fill this out. See you next time!  Annitta Needs, PhD, ANP-BC [facility]

## 2019-06-19 NOTE — Progress Notes (Signed)
   Subjective:    Patient ID: [name], female    DOB: 06/01/1961, 58 y.o.   MRN: [id number]  HPI Pt here today for follow up. Pt states therapy has been helping with arm pain. No issues with blood pressure Patient was having back pain as well as arm pain and she has been doing physical therapy which is helping her.  Patient denies any major setbacks recently.  She did have a fall earlier this year but she is starting to get better from that [place] hypertension Review of Systems  Constitutional: Negative for activity change, appetite change and fatigue.  HENT: Negative for congestion and rhinorrhea.   Respiratory: Negative for cough and shortness of breath.   Cardiovascular: Negative for chest pain and leg swelling.  Gastrointestinal: Negative for abdominal pain and diarrhea.  Endocrine: Negative for polydipsia and polyphagia.  Skin: Negative for color change.  Neurological: Negative for dizziness and weakness.  Psychiatric/Behavioral: Negative for behavioral problems and confusion.       Objective:   Physical Exam Vitals reviewed.  Constitutional:      General: She is not in acute distress. HENT:     Head: Normocephalic and atraumatic.  Eyes:     General:        Right eye: No discharge.        Left eye: No discharge.  Neck:     Trachea: No tracheal deviation.  Cardiovascular:     Rate and Rhythm: Normal rate and regular rhythm.     Heart sounds: Normal heart sounds. No murmur.  Pulmonary:     Effort: Pulmonary effort is normal. No respiratory distress.     Breath sounds: Normal breath sounds.  Lymphadenopathy:     Cervical: No cervical adenopathy.  Skin:    General: Skin is warm and dry.  Neurological:     Mental Status: She is alert.     Coordination: Coordination normal.  Psychiatric:        Behavior: Behavior normal.           Assessment & Plan:  HTN continue current measures watch salt diet try to stay active  Recent falls continue physical therapy  proper functions regarding balance discussed.  Follow-up later in the spring

## 2019-06-19 NOTE — Therapy (Signed)
[facility] [place], [place], [address] Phone: [phone]   Fax:  [phone]  Occupational Therapy Treatment  Patient Details  Name: [name] MRN: [id number] Date of Birth: 06-17-1961 Referring Provider (OT): Dr. Sallee Lange   Encounter Date: 06/19/2019  OT End of Session - 06/19/19 1516    Visit Number  5    Number of Visits  8    Date for OT Re-Evaluation  06/29/19    Authorization Type  1) UHC Medicare 2) Medicaid; no visit limit, no co-pay    Authorization Time Period  progress note on visit 8    Authorization - Visit Number  5    Authorization - Number of Visits  8    OT Start Time  4287    OT Stop Time  1512    OT Time Calculation (min)  40 min    Activity Tolerance  Patient tolerated treatment well    Behavior During Therapy  [facility] for tasks assessed/performed       Past Medical History:  Diagnosis Date  . Anxiety   . Diabetes mellitus   . Diabetes mellitus, type II ([place])   . HTN (hypertension)   . Hyperglycemia   . IBS (irritable bowel syndrome)   . Lung nodules    right, followed by PCP, PET 11/2011  . Mental retardation   . MI (myocardial infarction) ([place])   . Migraines   . Sleep apnea     Past Surgical History:  Procedure Laterality Date  . ABDOMINAL HYSTERECTOMY    . COLONOSCOPY  11/2007   hyperplastic polyps, prior hx of adenomas   . COLONOSCOPY  05/2010   incomplete due to poor prep, hyperplastic rectal polyp  . COLONOSCOPY  05/05/2002   Dimunitive polyps in the rectum and left colon, cold    biopsied/removed.  Scattered few left-sided diverticula.  Regular colonic   mucosa appeared normal  . COLONOSCOPY N/A 05/28/2013   Rourk: mulitple tubular adenomas removed. next tcs 05/2016  . COLONOSCOPY WITH PROPOFOL N/A 09/07/2016   Dr. Gala Romney: For hyperplastic polyps removed. Next colonoscopy March 2023 given history of adenomatous colon polyps in the past.  . ESOPHAGOGASTRODUODENOSCOPY  08/2007    moderate sized hiatal hernia  . ESOPHAGOGASTRODUODENOSCOPY  05/2010   noncritical schatzki ring s/p 72F  . ESOPHAGOGASTRODUODENOSCOPY (EGD) WITH ESOPHAGEAL DILATION N/A 02/06/2013   GOT:LXBWIO esophagus-s/p dilation up to a 6 [place] size with [facility] dilators.  Hiatal hernia  . ESOPHAGOGASTRODUODENOSCOPY (EGD) WITH PROPOFOL N/A 09/07/2016   Dr. Gala Romney: Normal, status post empiric dilation of the esophagus for history of dysphagia  . ESOPHAGOGASTRODUODENOSCOPY (EGD) WITH PROPOFOL N/A 05/02/2019   Procedure: ESOPHAGOGASTRODUODENOSCOPY (EGD) WITH PROPOFOL;  Surgeon: Daneil Dolin, MD;  [facility];  Service: Endoscopy;  Laterality: N/A;  1:30pm  . EXTERNAL EAR SURGERY     bilateral  . FOOT SURGERY    . GLAUCOMA SURGERY    . MALONEY DILATION N/A 09/07/2016   Procedure: Venia Minks DILATION;  Surgeon: Daneil Dolin, MD;  [facility];  Service: Endoscopy;  Laterality: N/A;  . Venia Minks DILATION N/A 05/02/2019   Procedure: Venia Minks DILATION;  Surgeon: Daneil Dolin, MD;  [facility];  Service: Endoscopy;  Laterality: N/A;  . POLYPECTOMY  09/07/2016   Procedure: POLYPECTOMY;  Surgeon: Daneil Dolin, MD;  [facility];  Service: Endoscopy;;  sigmoid colon x4  . small bowel capsule  10/2007   normal  There were no vitals filed for this visit.  Subjective Assessment - 06/19/19 1431    Subjective   S: It's feeling some better today.    Currently in Pain?  Yes    Pain Score  6     Pain Location  Shoulder    Pain Orientation  Left    Pain Descriptors / Indicators  Aching;Sore    Pain Type  Chronic pain    Pain Radiating Towards  N/A    Pain Onset  More than a month ago    Pain Frequency  Constant    Aggravating Factors   movement    Pain Relieving Factors  ES and heat    Effect of Pain on Daily Activities  mod effect on ADLs    Multiple Pain Sites  No         [facility] OT Assessment - 06/19/19 1431      Assessment   Medical Diagnosis  Left  shoulder pain    Referring Provider (OT)  Dr. Sallee Lange      Precautions   Precautions  Fall;Other (comment)    Precaution Comments  frequent falls, cognitive deficits               OT Treatments/Exercises (OP) - 06/19/19 1435      Exercises   Exercises  Shoulder      Shoulder Exercises: Seated   Extension  AROM;10 reps    Protraction  AROM;12 reps    Horizontal ABduction  AROM;12 reps    External Rotation  AROM;12 reps    Internal Rotation  AROM;12 reps    Flexion  AAROM;12 reps    Abduction  AROM;12 reps;Limitations    ABduction Limitations  50% ROM      Shoulder Exercises: ROM/Strengthening   UBE (Upper Arm Bike)  Level 3' forward 3' reverse   pace: 4.0-6.0   Wall Wash  1'    Other ROM/Strengthening Exercises  Pt using large pegboard secured to back of standing mirror. Pt placing pegs in specific pattern, requiring cuing for correct peg placement. Pt reaching into flexion at various heights during task.       Functional Reaching Activities   High Level  Pt placed 10 cones on top of refrigerator in flexion, removed in abduction. Pt then placed and removed 10 cones from top shelf of kitchen cabinet in flexion.                OT Short Term Goals - 06/09/19 1621      OT SHORT TERM GOAL #1   Title  Pt will be provided with and educated on HEP to improve mobility in LUE required for ADL completion.    Time  4    Period  Weeks    Status  On-going    Target Date  06/29/19      OT SHORT TERM GOAL #2   Title  Pt will decrease pain in LUE to 6/10 or less to improve ability to sleep.    Time  4    Period  Weeks    Status  On-going      OT SHORT TERM GOAL #3   Title  Pt will increase A/ROM of LUE to [facility] to improve ability to donn jackets and shirts independently.    Time  4    Period  Weeks    Status  On-going      OT SHORT TERM GOAL #4   Title  Pt will decrease  LUE fascial restrictions to minimal amounts to improve mobility required for functional  reaching tasks.    Time  4    Period  Weeks    Status  On-going      OT SHORT TERM GOAL #5   Title  Pt will increase LUE strength to 4/5 to improve ability to perform reaching tasks overhead and behind back.    Time  4    Period  Weeks    Status  On-going               Plan - 06/19/19 1516    Clinical Impression Statement  A: Session focusing on functional reaching in various planes today. Pt with ROM WNL during functional reaching. Pt did complete A/ROM exercises with visual demonstration from OT, ROM limited to 50% in abduction. Verbal cuing for form and technique.    Body Structure / Function / Physical Skills  ADL;UE functional use;Fascial restriction;Pain;ROM;IADL;Strength;Edema    Plan  P: Continue with functional reaching, complete functional reaching task with squigz on doorway       Patient will benefit from skilled therapeutic intervention in order to improve the following deficits and impairments:   Body Structure / Function / Physical Skills: ADL, UE functional use, Fascial restriction, Pain, ROM, IADL, Strength, Edema       Visit Diagnosis: Chronic left shoulder pain  Stiffness of left shoulder, not elsewhere classified  Other symptoms and signs involving the musculoskeletal system    Problem List Patient Active Problem List   Diagnosis Date Noted  . Rhabdomyolysis 03/22/2019  . Acute metabolic encephalopathy 40/02/6760  . Fall in bathtub   . Neck pain   . Primary osteoarthritis of both knees 01/27/2019  . Hyperlipidemia 09/26/2017  . Dysphagia 08/15/2016  . Obstructive sleep apnea syndrome 06/27/2016  . Rectal bleeding 04/07/2013  . Melena 03/04/2013  . Hematemesis 03/04/2013  . Abdominal pain, epigastric 03/04/2013  . Hypokalemia 03/04/2013  . Smoker 01/16/2012  . Diarrhea 12/26/2011  . Pulmonary nodule, right 12/26/2011  . Abdominal pain 10/08/2011  . Fatty liver 08/24/2011  . Constipation 10/20/2010  . NAUSEA AND VOMITING 06/09/2010   . HEMATOCHEZIA 04/21/2010  . OTHER DYSPHAGIA 04/21/2010  . MILD MENTAL RETARDATION 02/17/2009  . GLAUCOMA 02/17/2009  . Morbid obesity ([place]) 01/15/2009  . ANXIETY 01/15/2009  . UNSPECIFIED MENTAL RETARDATION 01/15/2009  . MIGRAINE HEADACHE 01/15/2009  . Essential hypertension 01/15/2009  . HEMORRHOIDS 01/15/2009  . GASTROESOPHAGEAL REFLUX DISEASE, CHRONIC 01/15/2009  . CONSTIPATION, CHRONIC 01/15/2009  . IBS (irritable bowel syndrome) 01/15/2009  . KNEE PAIN, CHRONIC 01/15/2009  . BACK PAIN, CHRONIC 01/15/2009  . Hx of adenomatous colonic polyps 01/15/2009   Guadelupe Sabin, OTR/L  [phone] 06/19/2019, 3:18 PM  [facility] [address] [place], [place], [address] Phone: [phone]   Fax:  [phone]  Name: [name] MRN: [id number] Date of Birth: September 16, 1960

## 2019-06-19 NOTE — Telephone Encounter (Signed)
Pharmacy is asking [place] resend dicyclomine (BENTYL) 10 MG capsule. They do not have script on file.

## 2019-06-19 NOTE — Telephone Encounter (Signed)
Prescription sent electronically to pharmacy. 

## 2019-06-19 NOTE — Telephone Encounter (Signed)
1 taken 3 times daily as needed for abdominal cramps Dicyclomine 10 mg, #45, 3 refills

## 2019-06-19 NOTE — Progress Notes (Signed)
Referring Provider: Kathyrn Drown, MD Primary Care Physician:  Kathyrn Drown, MD Primary GI: Dr. Gala Romney   Chief Complaint  Patient presents with  . Dysphagia    had egd 04/2019. Reports trouble when she eats/takes medication. Feels like it "sits up in throat"    HPI:   [name] is a 58 y.o. female presenting today with a history of GERD, IBS-D, dysphagia, colonoscopy due in 2023 (history of adenomas). EGD with empiric dilation completed Oct 2020.   States pills get caught in her throat. Still with some solid food dysphagia to bread, dark meat, baked chicken. Lays in bed/recliner to eat.   Aciphex BID. Eats whatever she wants per Education officer, museum. Soft BM daily   Past Medical History:  Diagnosis Date  . Anxiety   . Diabetes mellitus   . Diabetes mellitus, type II ([place])   . HTN (hypertension)   . Hyperglycemia   . IBS (irritable bowel syndrome)   . Lung nodules    right, followed by PCP, PET 11/2011  . Mental retardation   . MI (myocardial infarction) ([place])   . Migraines   . Sleep apnea     Past Surgical History:  Procedure Laterality Date  . ABDOMINAL HYSTERECTOMY    . COLONOSCOPY  11/2007   hyperplastic polyps, prior hx of adenomas   . COLONOSCOPY  05/2010   incomplete due to poor prep, hyperplastic rectal polyp  . COLONOSCOPY  05/05/2002   Dimunitive polyps in the rectum and left colon, cold    biopsied/removed.  Scattered few left-sided diverticula.  Regular colonic   mucosa appeared normal  . COLONOSCOPY N/A 05/28/2013   Rourk: mulitple tubular adenomas removed. next tcs 05/2016  . COLONOSCOPY WITH PROPOFOL N/A 09/07/2016   Dr. Gala Romney: For hyperplastic polyps removed. Next colonoscopy March 2023 given history of adenomatous colon polyps in the past.  . ESOPHAGOGASTRODUODENOSCOPY  08/2007   moderate sized hiatal hernia  . ESOPHAGOGASTRODUODENOSCOPY  05/2010   noncritical schatzki ring s/p 41F  . ESOPHAGOGASTRODUODENOSCOPY (EGD) WITH ESOPHAGEAL DILATION N/A  02/06/2013   KWI:OXBDZH esophagus-s/p dilation up to a 35 [place] size with [facility] dilators.  Hiatal hernia  . ESOPHAGOGASTRODUODENOSCOPY (EGD) WITH PROPOFOL N/A 09/07/2016   Dr. Gala Romney: Normal, status post empiric dilation of the esophagus for history of dysphagia  . ESOPHAGOGASTRODUODENOSCOPY (EGD) WITH PROPOFOL N/A 05/02/2019   Procedure: ESOPHAGOGASTRODUODENOSCOPY (EGD) WITH PROPOFOL;  Surgeon: Daneil Dolin, MD;  [facility];  Service: Endoscopy;  Laterality: N/A;  1:30pm  . EXTERNAL EAR SURGERY     bilateral  . FOOT SURGERY    . GLAUCOMA SURGERY    . MALONEY DILATION N/A 09/07/2016   Procedure: Venia Minks DILATION;  Surgeon: Daneil Dolin, MD;  [facility];  Service: Endoscopy;  Laterality: N/A;  . Venia Minks DILATION N/A 05/02/2019   Procedure: Venia Minks DILATION;  Surgeon: Daneil Dolin, MD;  [facility];  Service: Endoscopy;  Laterality: N/A;  . POLYPECTOMY  09/07/2016   Procedure: POLYPECTOMY;  Surgeon: Daneil Dolin, MD;  [facility];  Service: Endoscopy;;  sigmoid colon x4  . small bowel capsule  10/2007   normal    Current Outpatient Medications  Medication Sig Dispense Refill  . atorvastatin (LIPITOR) 10 MG tablet Take 1 tablet (10 mg total) by mouth daily. Start 04/01/19 30 tablet 0  . benztropine (COGENTIN) 1 MG tablet Take 1 tablet (1 mg total) by mouth daily. 90 tablet 2  . dicyclomine (  BENTYL) 10 MG capsule May use one up to 3 times a day prn loose BM (Patient taking differently: Take 10 mg by mouth 4 (four) times daily -  before meals and at bedtime. For loose stools, hold for constipation) 120 capsule 5  . lamoTRIgine (LAMICTAL) 100 MG tablet Take 1 tablet (100 mg total) by mouth 2 (two) times daily. 180 tablet 2  . lisinopril (ZESTRIL) 2.5 MG tablet Take 2.5 mg by mouth daily.    . paliperidone (INVEGA) 9 MG 24 hr tablet Take 1 tablet (9 mg total) by mouth daily. 90 tablet 2  . potassium chloride (K-DUR) 10 MEQ tablet Take 1 tablet  (10 mEq total) by mouth 2 (two) times daily. 60 tablet 5  . propranolol (INDERAL) 10 MG tablet Take 10 mg by mouth 3 (three) times daily.    . RABEprazole (ACIPHEX) 20 MG tablet TAKE 1 TABLET BY MOUTH TWICE A DAY. (Patient taking differently: Take 20 mg by mouth 2 (two) times daily. ) 60 tablet 5  . sertraline (ZOLOFT) 100 MG tablet Take 1 tablet (100 mg total) by mouth daily. 90 tablet 2  . torsemide (DEMADEX) 20 MG tablet TAKE (1) TABLET BY MOUTH EACH MORNING. (Patient taking differently: Take 20 mg by mouth daily. ) 30 tablet 5  . traZODone (DESYREL) 100 MG tablet TAKE 2 TABLETS(200MG ) BY MOUTH AT BEDTIME. 180 tablet 2  . hydrocortisone (ANUSOL-HC) 2.5 % rectal cream Place 1 application rectally 2 (two) times daily. (Patient not taking: Reported on 06/19/2019) 30 g 1  . polyethylene glycol (MIRALAX / GLYCOLAX) 17 g packet Take 17 g by mouth daily as needed for mild constipation.     No current facility-administered medications for this visit.    Allergies as of 06/19/2019 - Review Complete 06/19/2019  Allergen Reaction Noted  . Thorazine [chlorpromazine hcl] Anaphylaxis 10/20/2010  . Acetaminophen Other (See Comments)   . Aspirin Other (See Comments)   . Aspirin-acetaminophen-caffeine Other (See Comments)   . Nsaids Nausea And Vomiting   . Other  03/22/2019  . Penicillins Nausea And Vomiting   . Tomato Rash 01/17/2012    Family History  Problem Relation Age of Onset  . Stroke Mother   . Heart attack Father   . Schizophrenia Other   . Drug abuse Other   . Alcohol abuse Other   . Colon cancer Other        aunt  . Obesity Other   . COPD Other   . GER disease Other   . Diabetes type II Other   . Anxiety disorder Other   . Depression Other   . Depression Sister   . Schizophrenia Sister   . Liver disease Neg Hx   . Inflammatory bowel disease Neg Hx     Social History   Socioeconomic History  . Marital status: Divorced    Spouse name: Not on file  . Number of children:  Not on file  . Years of education: Not on file  . Highest education level: High school graduate  Occupational History  . Occupation: disabled    Fish farm manager: UNEMPLOYED  Tobacco Use  . Smoking status: Current Every Day Smoker    Packs/day: 0.25    Years: 40.00    Pack years: 10.00    Types: Cigarettes  . Smokeless tobacco: Never Used  Substance and Sexual Activity  . Alcohol use: No  . Drug use: No  . Sexual activity: Yes    Partners: Male    Birth control/protection:  None    Comment: boyfriend  Other Topics Concern  . Not on file  Social History Narrative  . Not on file   Social Determinants of Health   Financial Resource Strain: Low Risk   . Difficulty of Paying Living Expenses: Not hard at all  Food Insecurity: No Food Insecurity  . Worried About Charity fundraiser in the Last Year: Never true  . Ran Out of Food in the Last Year: Never true  Transportation Needs: No Transportation Needs  . Lack of Transportation (Medical): No  . Lack of Transportation (Non-Medical): No  Physical Activity: Insufficiently Active  . Days of Exercise per Week: 7 days  . Minutes of Exercise per Session: 20 min  Stress: No Stress Concern Present  . Feeling of Stress : Only a little  Social Connections: Unknown  . Frequency of Communication with Friends and Family: Not asked  . Frequency of Social Gatherings with Friends and Family: Not on file  . Attends Religious Services: Not on file  . Active Member of Clubs or Organizations: Not on file  . Attends Archivist Meetings: Not on file  . Marital Status: Not on file    Review of Systems: Gen: Denies fever, chills, anorexia. Denies fatigue, weakness, weight loss.  CV: Denies chest pain, palpitations, syncope, peripheral edema, and claudication. Resp: Denies dyspnea at rest, cough, wheezing, coughing up blood, and pleurisy. GI: see HPI Derm: Denies rash, itching, dry skin Psych: Denies depression, anxiety, memory loss,  confusion. No homicidal or suicidal ideation.  Heme: Denies bruising, bleeding, and enlarged lymph nodes.  Physical Exam: BP 129/78   Pulse 88   Temp (!) 96.9 F (36.1 C) (Oral)   Ht 5\' 9"  (1.753 m)   Wt 239 lb 3.2 oz (108.5 kg)   BMI 35.32 kg/m  General:   Alert and oriented.  Head:  Normocephalic and atraumatic. Abdomen:  +BS, soft, non-tender and non-distended. Unable to get on exam table.  Extremities:  Without edema. Neurologic:  Alert and  oriented x4 Psych:  Alert and cooperative.  ASSESSMENT: [name] is a 58 y.o. female presenting today with persistent dysphagia despite recent EGD with empiric dilation. Multifactorial in setting of GERD, reclining back while eating, and may have a motility disorder underlying. Will pursue BPE in near future. May need Speech.    PLAN:  BPE Aciphex BID Return in 6 months  Annitta Needs, PhD, [facility]

## 2019-06-20 ENCOUNTER — Encounter: Payer: Self-pay | Admitting: Gastroenterology

## 2019-06-20 NOTE — Telephone Encounter (Signed)
6 months on refills

## 2019-06-24 ENCOUNTER — Telehealth (HOSPITAL_COMMUNITY): Payer: Self-pay | Admitting: Specialist

## 2019-06-24 ENCOUNTER — Encounter (HOSPITAL_COMMUNITY): Payer: Self-pay

## 2019-06-24 ENCOUNTER — Ambulatory Visit (HOSPITAL_COMMUNITY): Payer: Medicare Other

## 2019-06-24 ENCOUNTER — Telehealth: Payer: Self-pay | Admitting: Family Medicine

## 2019-06-24 DIAGNOSIS — R29898 Other symptoms and signs involving the musculoskeletal system: Secondary | ICD-10-CM

## 2019-06-24 DIAGNOSIS — M6281 Muscle weakness (generalized): Secondary | ICD-10-CM

## 2019-06-24 DIAGNOSIS — M25612 Stiffness of left shoulder, not elsewhere classified: Secondary | ICD-10-CM

## 2019-06-24 DIAGNOSIS — R270 Ataxia, unspecified: Secondary | ICD-10-CM

## 2019-06-24 DIAGNOSIS — G8929 Other chronic pain: Secondary | ICD-10-CM | POA: Diagnosis not present

## 2019-06-24 DIAGNOSIS — M25512 Pain in left shoulder: Secondary | ICD-10-CM | POA: Diagnosis not present

## 2019-06-24 DIAGNOSIS — R296 Repeated falls: Secondary | ICD-10-CM

## 2019-06-24 NOTE — Therapy (Signed)
[place], [place], [address] Phone: [phone]   Fax:  [phone]  Occupational Therapy Treatment  Patient Details  Name: [name] MRN: [id number] Date of Birth: March 28, 1961 Referring Provider (OT): Dr. Sallee Lange   Encounter Date: 06/24/2019  OT End of Session - 06/24/19 1407    Visit Number  6    Number of Visits  8    Date for OT Re-Evaluation  06/29/19    Authorization Type  1) UHC Medicare 2) Medicaid; no visit limit, no co-pay    Authorization Time Period  progress note on visit 8    Authorization - Visit Number  6    Authorization - Number of Visits  8    OT Start Time  4259   pt arrived late   OT Stop Time  1339    OT Time Calculation (min)  26 min    Activity Tolerance  Patient tolerated treatment well    Behavior During Therapy  [facility] for tasks assessed/performed       Past Medical History:  Diagnosis Date  . Anxiety   . Diabetes mellitus   . Diabetes mellitus, type II ([place])   . HTN (hypertension)   . Hyperglycemia   . IBS (irritable bowel syndrome)   . Lung nodules    right, followed by PCP, PET 11/2011  . Mental retardation   . MI (myocardial infarction) ([place])   . Migraines   . Sleep apnea     Past Surgical History:  Procedure Laterality Date  . ABDOMINAL HYSTERECTOMY    . COLONOSCOPY  11/2007   hyperplastic polyps, prior hx of adenomas   . COLONOSCOPY  05/2010   incomplete due to poor prep, hyperplastic rectal polyp  . COLONOSCOPY  05/05/2002   Dimunitive polyps in the rectum and left colon, cold    biopsied/removed.  Scattered few left-sided diverticula.  Regular colonic   mucosa appeared normal  . COLONOSCOPY N/A 05/28/2013   Rourk: mulitple tubular adenomas removed. next tcs 05/2016  . COLONOSCOPY WITH PROPOFOL N/A 09/07/2016   Dr. Gala Romney: For hyperplastic polyps removed. Next colonoscopy March 2023 given history of adenomatous colon polyps in the past.  .  ESOPHAGOGASTRODUODENOSCOPY  08/2007   moderate sized hiatal hernia  . ESOPHAGOGASTRODUODENOSCOPY  05/2010   noncritical schatzki ring s/p 52F  . ESOPHAGOGASTRODUODENOSCOPY (EGD) WITH ESOPHAGEAL DILATION N/A 02/06/2013   DGL:OVFIEP esophagus-s/p dilation up to a 45 [place] size with [facility] dilators.  Hiatal hernia  . ESOPHAGOGASTRODUODENOSCOPY (EGD) WITH PROPOFOL N/A 09/07/2016   Dr. Gala Romney: Normal, status post empiric dilation of the esophagus for history of dysphagia  . ESOPHAGOGASTRODUODENOSCOPY (EGD) WITH PROPOFOL N/A 05/02/2019   Normal esophagus s/p dilation, normal stomach, normal duodenum  . EXTERNAL EAR SURGERY     bilateral  . FOOT SURGERY    . GLAUCOMA SURGERY    . MALONEY DILATION N/A 09/07/2016   Procedure: Venia Minks DILATION;  Surgeon: Daneil Dolin, MD;  [facility];  Service: Endoscopy;  Laterality: N/A;  . Venia Minks DILATION N/A 05/02/2019   Procedure: Venia Minks DILATION;  Surgeon: Daneil Dolin, MD;  [facility];  Service: Endoscopy;  Laterality: N/A;  . POLYPECTOMY  09/07/2016   Procedure: POLYPECTOMY;  Surgeon: Daneil Dolin, MD;  [facility];  Service: Endoscopy;;  sigmoid colon x4  . small bowel capsule  10/2007   normal    There were no vitals filed for this visit.  Subjective  Assessment - 06/24/19 1317    Currently in Pain?  Yes    Pain Score  7     Pain Location  Shoulder    Pain Orientation  Left    Pain Descriptors / Indicators  Aching;Sore         [facility] OT Assessment - 06/24/19 1318      Assessment   Medical Diagnosis  Left shoulder pain      Precautions   Precautions  Fall;Other (comment)    Precaution Comments  frequent falls, cognitive deficits               OT Treatments/Exercises (OP) - 06/24/19 1318      Exercises   Exercises  Shoulder      Shoulder Exercises: Seated   Protraction  AROM;12 reps    Horizontal ABduction  AROM;12 reps    External Rotation  AROM;12 reps    Internal Rotation  AROM;12  reps    Flexion  AROM;12 reps    Abduction  AROM;12 reps      Shoulder Exercises: Therapy Ball   Other Therapy Ball Exercises  Green therapy ball: 10X; chest press, flexion, circles (both directions)      Shoulder Exercises: ROM/Strengthening   Wall Wash  1'    Over Head Lace  2' seated               OT Short Term Goals - 06/09/19 1621      OT SHORT TERM GOAL #1   Title  Pt will be provided with and educated on HEP to improve mobility in LUE required for ADL completion.    Time  4    Period  Weeks    Status  On-going    Target Date  06/29/19      OT SHORT TERM GOAL #2   Title  Pt will decrease pain in LUE to 6/10 or less to improve ability to sleep.    Time  4    Period  Weeks    Status  On-going      OT SHORT TERM GOAL #3   Title  Pt will increase A/ROM of LUE to [facility] to improve ability to donn jackets and shirts independently.    Time  4    Period  Weeks    Status  On-going      OT SHORT TERM GOAL #4   Title  Pt will decrease LUE fascial restrictions to minimal amounts to improve mobility required for functional reaching tasks.    Time  4    Period  Weeks    Status  On-going      OT SHORT TERM GOAL #5   Title  Pt will increase LUE strength to 4/5 to improve ability to perform reaching tasks overhead and behind back.    Time  4    Period  Weeks    Status  On-going               Plan - 06/24/19 1408    Clinical Impression Statement  A: Patient was able to complete exercises while being provided visual demonstration and verbal cueing. Added therapy ball strengthening and overhead lacing to continue to focus on scapular and shoulder strength. No manual techniques or passive stretching needed this date.    Body Structure / Function / Physical Skills  ADL;UE functional use;Fascial restriction;Pain;ROM;IADL;Strength;Edema    Plan  P: Continue with functional reaching task focusing on functional strengthening. Attempt a 1/2lb wrist weight if  able.     Consulted and Agree with Plan of Care  Patient       Patient will benefit from skilled therapeutic intervention in order to improve the following deficits and impairments:   Body Structure / Function / Physical Skills: ADL, UE functional use, Fascial restriction, Pain, ROM, IADL, Strength, Edema       Visit Diagnosis: Chronic left shoulder pain  Stiffness of left shoulder, not elsewhere classified  Other symptoms and signs involving the musculoskeletal system    Problem List Patient Active Problem List   Diagnosis Date Noted  . Rhabdomyolysis 03/22/2019  . Acute metabolic encephalopathy 70/26/3785  . Fall in bathtub   . Neck pain   . Primary osteoarthritis of both knees 01/27/2019  . Hyperlipidemia 09/26/2017  . Dysphagia 08/15/2016  . Obstructive sleep apnea syndrome 06/27/2016  . Rectal bleeding 04/07/2013  . Melena 03/04/2013  . Hematemesis 03/04/2013  . Abdominal pain, epigastric 03/04/2013  . Hypokalemia 03/04/2013  . Smoker 01/16/2012  . Diarrhea 12/26/2011  . Pulmonary nodule, right 12/26/2011  . Abdominal pain 10/08/2011  . Fatty liver 08/24/2011  . Constipation 10/20/2010  . NAUSEA AND VOMITING 06/09/2010  . HEMATOCHEZIA 04/21/2010  . OTHER DYSPHAGIA 04/21/2010  . MILD MENTAL RETARDATION 02/17/2009  . GLAUCOMA 02/17/2009  . Morbid obesity ([place]) 01/15/2009  . ANXIETY 01/15/2009  . UNSPECIFIED MENTAL RETARDATION 01/15/2009  . MIGRAINE HEADACHE 01/15/2009  . Essential hypertension 01/15/2009  . HEMORRHOIDS 01/15/2009  . GASTROESOPHAGEAL REFLUX DISEASE, CHRONIC 01/15/2009  . CONSTIPATION, CHRONIC 01/15/2009  . IBS (irritable bowel syndrome) 01/15/2009  . KNEE PAIN, CHRONIC 01/15/2009  . BACK PAIN, CHRONIC 01/15/2009  . Hx of adenomatous colonic polyps 01/15/2009     Ailene Ravel, OTR/L,CBIS  [phone]  06/24/2019, 2:16 PM  [facility] [address] [place], [place], [address] Phone: [phone]    Fax:  [phone]  Name: [name] MRN: [id number] Date of Birth: 05/12/1961

## 2019-06-24 NOTE — Telephone Encounter (Signed)
Case worker Charlynne Pander [phone]   l/m to cx this apptment b/c she can not bring her on this date. NF 06/24/2019

## 2019-06-24 NOTE — Telephone Encounter (Signed)
Please advise. Thank you

## 2019-06-24 NOTE — Telephone Encounter (Signed)
Currently I am not aware if they will or will not cover for a aide I think it is fine to consult them Diagnosis muscle weakness, ataxia, frequent falls May consult home health with also to see if home health can cover a to help her some/see if she qualifies

## 2019-06-24 NOTE — Telephone Encounter (Signed)
Pt's case worker came by to see if Dr. Nicki Reaper would place a referral for [facility] and see if Medicaid would cover the expense of having an aid to come to her home. With her physical change she is wondering if Medicaid would cover this expense now.

## 2019-06-24 NOTE — Telephone Encounter (Signed)
Referral placed and [name] has been informed

## 2019-06-26 ENCOUNTER — Encounter (HOSPITAL_COMMUNITY): Payer: Medicare Other

## 2019-06-30 ENCOUNTER — Encounter (HOSPITAL_COMMUNITY): Payer: Medicare Other | Admitting: Specialist

## 2019-06-30 ENCOUNTER — Encounter (HOSPITAL_COMMUNITY): Payer: Self-pay

## 2019-06-30 ENCOUNTER — Other Ambulatory Visit: Payer: Self-pay

## 2019-06-30 ENCOUNTER — Ambulatory Visit (HOSPITAL_COMMUNITY): Payer: Medicare Other

## 2019-06-30 DIAGNOSIS — R29898 Other symptoms and signs involving the musculoskeletal system: Secondary | ICD-10-CM | POA: Diagnosis not present

## 2019-06-30 DIAGNOSIS — G8929 Other chronic pain: Secondary | ICD-10-CM | POA: Diagnosis not present

## 2019-06-30 DIAGNOSIS — M25612 Stiffness of left shoulder, not elsewhere classified: Secondary | ICD-10-CM

## 2019-06-30 DIAGNOSIS — M25512 Pain in left shoulder: Secondary | ICD-10-CM

## 2019-06-30 NOTE — Patient Instructions (Signed)
Complete each exercise 10-12 times. Once a day.   Wall slides  For Improved Shoulder Flexion ROM: Use wall as a guide and gently slide your arms up   Standing Scaption full range  Standing with shoulders relaxed and shoulder blades squeezed together, lift arm to full range in "Y" position while keeping good posture and avoiding shoulder shrugging.       Shelf Lifts  Grab appropriate weight and lift to a high shelf as tolerated by shoulder limitation. Use 1# weight, hold a water bottle or can of soup.   Arm Circles  Circle arm in wide arc, achieving stretch throughout entire shoulder Repeat on alternating sides

## 2019-06-30 NOTE — Therapy (Signed)
[place], [place], [address] Phone: [phone]   Fax:  [phone]  Occupational Therapy Treatment Reassessment/discharge Patient Details  Name: [name] MRN: [id number] Date of Birth: [birth year] Referring Provider (OT): Dr. Sallee Lange   Encounter Date: 06/30/2019  OT End of Session - 06/30/19 1455    Visit Number  7    Number of Visits  8    Authorization Type  1) UHC Medicare 2) Medicaid; no visit limit, no co-pay    Authorization Time Period  progress note on visit 8    Authorization - Visit Number  7    Authorization - Number of Visits  8    OT Start Time  1346   reassess and discharge   OT Stop Time  1427    OT Time Calculation (min)  41 min    Activity Tolerance  Patient tolerated treatment well    Behavior During Therapy  [facility] for tasks assessed/performed       Past Medical History:  Diagnosis Date  . Anxiety   . Diabetes mellitus   . Diabetes mellitus, type II ([place])   . HTN (hypertension)   . Hyperglycemia   . IBS (irritable bowel syndrome)   . Lung nodules    right, followed by PCP, PET 11/2011  . Mental retardation   . MI (myocardial infarction) ([place])   . Migraines   . Sleep apnea     Past Surgical History:  Procedure Laterality Date  . ABDOMINAL HYSTERECTOMY    . COLONOSCOPY  11/2007   hyperplastic polyps, prior hx of adenomas   . COLONOSCOPY  05/2010   incomplete due to poor prep, hyperplastic rectal polyp  . COLONOSCOPY  05/05/2002   Dimunitive polyps in the rectum and left colon, cold    biopsied/removed.  Scattered few left-sided diverticula.  Regular colonic   mucosa appeared normal  . COLONOSCOPY N/A 05/28/2013   Rourk: mulitple tubular adenomas removed. next tcs 05/2016  . COLONOSCOPY WITH PROPOFOL N/A 09/07/2016   Dr. Gala Romney: For hyperplastic polyps removed. Next colonoscopy March 2023 given history of adenomatous colon polyps in the past.  . ESOPHAGOGASTRODUODENOSCOPY   08/2007   moderate sized hiatal hernia  . ESOPHAGOGASTRODUODENOSCOPY  05/2010   noncritical schatzki ring s/p 31F  . ESOPHAGOGASTRODUODENOSCOPY (EGD) WITH ESOPHAGEAL DILATION N/A 02/06/2013   YIF:OYDXAJ esophagus-s/p dilation up to a 64 [place] size with [facility] dilators.  Hiatal hernia  . ESOPHAGOGASTRODUODENOSCOPY (EGD) WITH PROPOFOL N/A 09/07/2016   Dr. Gala Romney: Normal, status post empiric dilation of the esophagus for history of dysphagia  . ESOPHAGOGASTRODUODENOSCOPY (EGD) WITH PROPOFOL N/A 05/02/2019   Normal esophagus s/p dilation, normal stomach, normal duodenum  . EXTERNAL EAR SURGERY     bilateral  . FOOT SURGERY    . GLAUCOMA SURGERY    . MALONEY DILATION N/A 09/07/2016   Procedure: Venia Minks DILATION;  Surgeon: Daneil Dolin, MD;  [facility];  Service: Endoscopy;  Laterality: N/A;  . Venia Minks DILATION N/A 05/02/2019   Procedure: Venia Minks DILATION;  Surgeon: Daneil Dolin, MD;  [facility];  Service: Endoscopy;  Laterality: N/A;  . POLYPECTOMY  09/07/2016   Procedure: POLYPECTOMY;  Surgeon: Daneil Dolin, MD;  [facility];  Service: Endoscopy;;  sigmoid colon x4  . small bowel capsule  10/2007   normal    There were no vitals filed for this visit.  Subjective Assessment - 06/30/19 1453    Subjective  S: My arm is better.    Special Tests  64/100 FOTO    Currently in Pain?  Yes    Pain Score  7     Pain Location  Shoulder    Pain Orientation  Left    Pain Descriptors / Indicators  Aching;Sore    Pain Type  Chronic pain    Pain Radiating Towards  N/A    Pain Onset  More than a month ago    Pain Frequency  Constant    Aggravating Factors   movement    Pain Relieving Factors  ES and heat    Effect of Pain on Daily Activities  min effect    Multiple Pain Sites  No         [facility] OT Assessment - 06/30/19 1353      Assessment   Medical Diagnosis  Left shoulder pain      Precautions   Precautions  Fall;Other (comment)    Precaution  Comments  frequent falls, cognitive deficits      ROM / Strength   AROM / PROM / Strength  AROM;PROM;Strength      AROM   Overall AROM Comments  Assessed seated, er/IR adducted    AROM Assessment Site  Shoulder    Right/Left Shoulder  Left    Left Shoulder Flexion  149 Degrees   previous: 91   Left Shoulder ABduction  152 Degrees   previous: 64   Left Shoulder Internal Rotation  90 Degrees   previous: same   Left Shoulder External Rotation  56 Degrees   previous: 50     PROM   Overall PROM   Within functional limits for tasks performed    Overall PROM Comments  Full P/ROM in all ranges      Strength   Overall Strength Comments  Assessed seated, er/IR adducted    Strength Assessment Site  Shoulder    Right/Left Shoulder  Left    Left Shoulder Flexion  3+/5   previous: 3-/5   Left Shoulder ABduction  4/5   previous: 3-/5   Left Shoulder Internal Rotation  5/5   previous: 3/5   Left Shoulder External Rotation  4-/5   previous: 3-/5                      OT Education - 06/30/19 1452    Education Details  reviewed progress in therapy and goals. HEP updated and reviewed.    Person(s) Educated  Patient    Methods  Explanation;Demonstration;Handout;Verbal cues    Comprehension  Verbalized understanding;Returned demonstration       OT Short Term Goals - 06/30/19 1405      OT SHORT TERM GOAL #1   Title  Pt will be provided with and educated on HEP to improve mobility in LUE required for ADL completion.    Time  4    Period  Weeks    Status  Achieved    Target Date  06/29/19      OT SHORT TERM GOAL #2   Title  Pt will decrease pain in LUE to 6/10 or less to improve ability to sleep.    Time  4    Period  Weeks    Status  Not Met      OT SHORT TERM GOAL #3   Title  Pt will increase A/ROM of LUE to [facility] to improve ability to donn jackets and shirts independently.    Time  4  Period  Weeks    Status  Achieved      OT SHORT TERM GOAL #4   Title  Pt  will decrease LUE fascial restrictions to minimal amounts to improve mobility required for functional reaching tasks.    Time  4    Period  Weeks    Status  Achieved      OT SHORT TERM GOAL #5   Title  Pt will increase LUE strength to 4/5 to improve ability to perform reaching tasks overhead and behind back.    Time  4    Period  Weeks    Status  Partially Met               Plan - 06/30/19 1503    Clinical Impression Statement  A: Reassessment completed this date. Patient has met 3/5 therapy goals with another one partially met. ROM and strength has improved significantly although pain level remains elevated constantly per patient report. Patient is able to continue with updated HEP at home independently at this time. Encouraged to follow up with MD as needed.    Body Structure / Function / Physical Skills  ADL;UE functional use;Fascial restriction;Pain;ROM;IADL;Strength;Edema    Plan  P: Discharge from OT services with HEP.    Consulted and Agree with Plan of Care  Patient       Patient will benefit from skilled therapeutic intervention in order to improve the following deficits and impairments:   Body Structure / Function / Physical Skills: ADL, UE functional use, Fascial restriction, Pain, ROM, IADL, Strength, Edema       Visit Diagnosis: Stiffness of left shoulder, not elsewhere classified  Chronic left shoulder pain  Other symptoms and signs involving the musculoskeletal system    Problem List Patient Active Problem List   Diagnosis Date Noted  . Fall in bathtub   . Neck pain   . Primary osteoarthritis of both knees 01/27/2019  . Hyperlipidemia 09/26/2017  . Dysphagia 08/15/2016  . Obstructive sleep apnea syndrome 06/27/2016  . Rectal bleeding 04/07/2013  . Melena 03/04/2013  . Hematemesis 03/04/2013  . Abdominal pain, epigastric 03/04/2013  . Hypokalemia 03/04/2013  . Smoker 01/16/2012  . Diarrhea 12/26/2011  . Pulmonary nodule, right 12/26/2011  .  Abdominal pain 10/08/2011  . Fatty liver 08/24/2011  . Constipation 10/20/2010  . NAUSEA AND VOMITING 06/09/2010  . HEMATOCHEZIA 04/21/2010  . OTHER DYSPHAGIA 04/21/2010  . MILD MENTAL RETARDATION 02/17/2009  . GLAUCOMA 02/17/2009  . Morbid obesity ([place]) 01/15/2009  . ANXIETY 01/15/2009  . MIGRAINE HEADACHE 01/15/2009  . Essential hypertension 01/15/2009  . HEMORRHOIDS 01/15/2009  . GASTROESOPHAGEAL REFLUX DISEASE, CHRONIC 01/15/2009  . CONSTIPATION, CHRONIC 01/15/2009  . IBS (irritable bowel syndrome) 01/15/2009  . KNEE PAIN, CHRONIC 01/15/2009  . BACK PAIN, CHRONIC 01/15/2009  . Hx of adenomatous colonic polyps 01/15/2009   OCCUPATIONAL THERAPY DISCHARGE SUMMARY  Visits from Start of Care: 7  Current functional level related to goals / functional outcomes: See above   Remaining deficits: See above   Education / Equipment: See above Plan: Patient agrees to discharge.  Patient goals were met. Patient is being discharged due to meeting the stated rehab goals.  ?????          Ailene Ravel, OTR/L,CBIS  [phone]  06/30/2019, 3:15 PM  [facility] [address] [place], [place], [address] Phone: [phone]   Fax:  [phone]  Name: SOLEDAD BUDREAU MRN: [id number] Date of Birth: [birth year]

## 2019-07-02 ENCOUNTER — Ambulatory Visit: Payer: Medicare Other | Admitting: Gastroenterology

## 2019-07-02 ENCOUNTER — Ambulatory Visit (HOSPITAL_COMMUNITY): Payer: Medicare Other | Admitting: Specialist

## 2019-07-08 ENCOUNTER — Encounter (HOSPITAL_COMMUNITY): Payer: Medicare Other | Admitting: Occupational Therapy

## 2019-07-10 ENCOUNTER — Encounter (HOSPITAL_COMMUNITY): Payer: Medicare Other | Admitting: Occupational Therapy

## 2019-07-17 ENCOUNTER — Other Ambulatory Visit: Payer: Self-pay

## 2019-07-17 ENCOUNTER — Ambulatory Visit (HOSPITAL_COMMUNITY)
Admission: RE | Admit: 2019-07-17 | Discharge: 2019-07-17 | Disposition: A | Discharge status: Home / Self Care | Payer: Medicare Other | Source: Ambulatory Visit | Attending: Gastroenterology | Admitting: Gastroenterology

## 2019-07-17 DIAGNOSIS — K224 Dyskinesia of esophagus: Secondary | ICD-10-CM | POA: Diagnosis not present

## 2019-07-17 DIAGNOSIS — R1310 Dysphagia, unspecified: Secondary | ICD-10-CM

## 2019-07-22 ENCOUNTER — Other Ambulatory Visit: Payer: Self-pay | Admitting: Internal Medicine

## 2019-07-23 ENCOUNTER — Other Ambulatory Visit: Payer: Self-pay | Admitting: *Deleted

## 2019-07-23 DIAGNOSIS — R1310 Dysphagia, unspecified: Secondary | ICD-10-CM

## 2019-08-03 ENCOUNTER — Telehealth: Payer: Self-pay | Admitting: Family Medicine

## 2019-08-03 NOTE — Telephone Encounter (Signed)
Nurses Please talk with patient's social service worker Find out from them the following In regards to personal care services does [name] have someone who comes 5 days a week?  7 days a week?  Is with her 24 hours a day?  8 hours a day?  In other words get more details Also what aspects does [name] need a Environmental consultant for?  Bathing?  Getting dressed?  Fixing food?  Shopping? All of this information will help [place] fill out this form that allows her to get further assessment Please get this information and then return to me with the form Thank you

## 2019-08-04 NOTE — Telephone Encounter (Signed)
Left message on social worker voicemail to return call

## 2019-08-04 NOTE — Telephone Encounter (Signed)
[name] (case worker) returned call. Charlett Nose has an aide 4-5 days a week at about 3-4 hours per day. Pt needs assistance with bathing, food (has food at home but does not want to eat due to wanting to eat junk per [name]; falls due to light headed), getting dressed, light housekeeping and shopping. [name] states pt is falling more. At this time, pt is having to pay for aid due to not having enough physically wrong. Please advise. Thank you

## 2019-08-04 NOTE — Telephone Encounter (Signed)
Thank you for the info Please send the folder back to me and I will finish it out thank you

## 2019-08-04 NOTE — Telephone Encounter (Signed)
Form in provider office for review. Please advise. Thank you

## 2019-08-10 NOTE — Telephone Encounter (Signed)
Form was completed 

## 2019-08-12 ENCOUNTER — Telehealth: Payer: Self-pay | Admitting: Family Medicine

## 2019-08-12 NOTE — Telephone Encounter (Signed)
Dr scott's note on 1/31 states form was completed and note from brendale today states form was faxed.

## 2019-08-12 NOTE — Telephone Encounter (Signed)
Pts case worker is checking status of FL2.

## 2019-08-12 NOTE — Telephone Encounter (Signed)
Faxed form.

## 2019-08-14 ENCOUNTER — Ambulatory Visit (HOSPITAL_COMMUNITY): Payer: Medicare Other | Attending: Gastroenterology | Admitting: Speech Pathology

## 2019-08-14 ENCOUNTER — Encounter (HOSPITAL_COMMUNITY): Payer: Self-pay | Admitting: Speech Pathology

## 2019-08-14 ENCOUNTER — Other Ambulatory Visit: Payer: Self-pay

## 2019-08-14 DIAGNOSIS — R1314 Dysphagia, pharyngoesophageal phase: Secondary | ICD-10-CM | POA: Insufficient documentation

## 2019-08-14 NOTE — Therapy (Signed)
[place], [place], [address] Phone: [phone]   Fax:  [phone]  Speech Language Pathology Evaluation/Clinical Swallow Evaluation  Patient Details  Name: [name] MRN: [id number] Date of Birth: [birth year] No data recorded  Encounter Date: 08/14/2019  End of Session - 08/14/19 1019    Visit Number  1    Number of Visits  1    Authorization Type  UHC Medicare   BCBS secondary   SLP Start Time  0957    SLP Stop Time   1030    SLP Time Calculation (min)  33 min    Activity Tolerance  Patient tolerated treatment well       Past Medical History:  Diagnosis Date  . Anxiety   . Diabetes mellitus   . Diabetes mellitus, type II ([place])   . HTN (hypertension)   . Hyperglycemia   . IBS (irritable bowel syndrome)   . Lung nodules    right, followed by PCP, PET 11/2011  . Mental retardation   . MI (myocardial infarction) ([place])   . Migraines   . Sleep apnea     Past Surgical History:  Procedure Laterality Date  . ABDOMINAL HYSTERECTOMY    . COLONOSCOPY  11/2007   hyperplastic polyps, prior hx of adenomas   . COLONOSCOPY  05/2010   incomplete due to poor prep, hyperplastic rectal polyp  . COLONOSCOPY  05/05/2002   Dimunitive polyps in the rectum and left colon, cold    biopsied/removed.  Scattered few left-sided diverticula.  Regular colonic   mucosa appeared normal  . COLONOSCOPY N/A 05/28/2013   Rourk: mulitple tubular adenomas removed. next tcs 05/2016  . COLONOSCOPY WITH PROPOFOL N/A 09/07/2016   Dr. Gala Romney: For hyperplastic polyps removed. Next colonoscopy March 2023 given history of adenomatous colon polyps in the past.  . ESOPHAGOGASTRODUODENOSCOPY  08/2007   moderate sized hiatal hernia  . ESOPHAGOGASTRODUODENOSCOPY  05/2010   noncritical schatzki ring s/p 10F  . ESOPHAGOGASTRODUODENOSCOPY (EGD) WITH ESOPHAGEAL DILATION N/A 02/06/2013   OAC:ZYSAYT esophagus-s/p dilation up to a 3 [place] size with  [facility] dilators.  Hiatal hernia  . ESOPHAGOGASTRODUODENOSCOPY (EGD) WITH PROPOFOL N/A 09/07/2016   Dr. Gala Romney: Normal, status post empiric dilation of the esophagus for history of dysphagia  . ESOPHAGOGASTRODUODENOSCOPY (EGD) WITH PROPOFOL N/A 05/02/2019   Normal esophagus s/p dilation, normal stomach, normal duodenum  . EXTERNAL EAR SURGERY     bilateral  . FOOT SURGERY    . GLAUCOMA SURGERY    . MALONEY DILATION N/A 09/07/2016   Procedure: Venia Minks DILATION;  Surgeon: Daneil Dolin, MD;  [facility];  Service: Endoscopy;  Laterality: N/A;  . Venia Minks DILATION N/A 05/02/2019   Procedure: Venia Minks DILATION;  Surgeon: Daneil Dolin, MD;  [facility];  Service: Endoscopy;  Laterality: N/A;  . POLYPECTOMY  09/07/2016   Procedure: POLYPECTOMY;  Surgeon: Daneil Dolin, MD;  [facility];  Service: Endoscopy;;  sigmoid colon x4  . small bowel capsule  10/2007   normal    There were no vitals filed for this visit.  Subjective Assessment - 08/14/19 1015    Subjective  "I have trouble swallowing chicken, bread, and beef."    Patient is accompained by:  Family member    Currently in Pain?  Yes    Pain Score  5     Pain Location  Throat  Prior Functional Status - 08/14/19 1020      Prior Functional Status   Cognitive/Linguistic Baseline  Within functional limits    Type of Home  Apartment     Lives With  Alone    Available Help at Discharge  Friend(s);Available PRN/intermittently    Education  community college    Vocation  On disability      General - 08/14/19 1020      General Information   Date of Onset  06/19/19    HPI  [name] is a 59 y.o. female presenting today with a history of GERD, IBS-D, dysphagia, colonoscopy due in 2023 (history of adenomas). EGD with empiric dilation completed Oct 2020. States pills get caught in her throat. Still with some solid food dysphagia to bread, dark meat, baked chicken. Lays in bed/recliner  to eat. Aciphex BID. Eats whatever she wants per Education officer, museum. Soft BM daily1/7/21 had BaSw: Relative narrowing of the GE junction region, which temporarily obstructed a 12.5 mm diameter tablet though this did eventually pass beyond following multiple swallows of water and thin barium. BSE requested to help educate Pt on esophageal swallowing precautions. Pt was referred by Vicente Males [place]/Dr. Rourk.    Type of Study  Bedside Swallow Evaluation    Diet Prior to this Study  Regular;Thin liquids    Temperature Spikes Noted  No    Respiratory Status  Room air    History of Recent Intubation  No    Behavior/Cognition  Alert;Cooperative    Oral Cavity Assessment  Within Functional Limits    Oral Care Completed by SLP  No    Oral Cavity - Dentition  Edentulous    Vision  Functional for self-feeding    Self-Feeding Abilities  Able to feed self    Patient Positioning  Upright in chair    Baseline Vocal Quality  Normal    Volitional Cough  Strong    Volitional Swallow  Able to elicit       Oral Motor/Sensory Function - 08/14/19 1045      Oral Motor/Sensory Function   Overall Oral Motor/Sensory Function  Within functional limits      Ice Chips - 08/14/19 1045      Ice Chips   Ice chips  Not tested      Thin Liquid - 08/14/19 1045      Thin Liquid   Thin Liquid  Within functional limits    Presentation  Cup;Self Fed      Nectar thick liquid - 08/14/19 1046      Nectar Thick Liquid   Nectar Thick Liquid  Not tested       Puree - 08/14/19 1046      Puree   Puree  Within functional limits    Presentation  Self Fed;Spoon      Solid - 08/14/19 1046      Solid   Solid  Within functional limits    Presentation  Self Fed        Plan - 08/14/19 1102    Clinical Impression Statement  Pt presents with reported esophageal phase dysphagia per GI notes and recent barium swallow study in January (Relative narrowing of GEJ region). Pt was provided a clinical swallow evaluation and  found to be WNL for oral phase with the exception of allowance for increased oral transit time due to edentulous status and suspected normal pharyngeal phase. Pt without overt signs of aspiration/reduced airway protection during presentation of thin liquids, puree, and  graham crackers. Pt did report mild discomfort with graham crackers. She reports difficulty swallowing chicken, bread, and beef stating that "it doesn't go down". Pt has dentures reportedly, but does not wear them for meals. SLP provided education regarding aspiration precautions, anatomy of oropharynx, esophageal/reflux precautions, list of soft solids, and compensatory strategies (small bites, thoroughly masticate meats/breads or cut into very small pieces, and follow solids with a sip of liquid). These were reviewed verbally and also provided in written form. Pt was encouraged to "slow cook" her meats and chop thoroughly. Pt was given my contact information should she have further questions. No further SLP services indicated at this time. Recommend self regulated regular textures with meats modified as above and thin liquids. Pt in agreement with plan of care.    Treatment/Interventions  Compensatory strategies;SLP instruction and feedback    Potential to Achieve Goals  Good    Consulted and Agree with Plan of Care  Patient       Patient will benefit from skilled therapeutic intervention in order to improve the following deficits and impairments:   Dysphagia, pharyngoesophageal phase    Problem List Patient Active Problem List   Diagnosis Date Noted  . Fall in bathtub   . Neck pain   . Primary osteoarthritis of both knees 01/27/2019  . Hyperlipidemia 09/26/2017  . Dysphagia 08/15/2016  . Obstructive sleep apnea syndrome 06/27/2016  . Rectal bleeding 04/07/2013  . Melena 03/04/2013  . Hematemesis 03/04/2013  . Abdominal pain, epigastric 03/04/2013  . Hypokalemia 03/04/2013  . Smoker 01/16/2012  . Diarrhea 12/26/2011  .  Pulmonary nodule, right 12/26/2011  . Abdominal pain 10/08/2011  . Fatty liver 08/24/2011  . Constipation 10/20/2010  . NAUSEA AND VOMITING 06/09/2010  . HEMATOCHEZIA 04/21/2010  . OTHER DYSPHAGIA 04/21/2010  . MILD MENTAL RETARDATION 02/17/2009  . GLAUCOMA 02/17/2009  . Morbid obesity ([place]) 01/15/2009  . ANXIETY 01/15/2009  . MIGRAINE HEADACHE 01/15/2009  . Essential hypertension 01/15/2009  . HEMORRHOIDS 01/15/2009  . GASTROESOPHAGEAL REFLUX DISEASE, CHRONIC 01/15/2009  . CONSTIPATION, CHRONIC 01/15/2009  . IBS (irritable bowel syndrome) 01/15/2009  . KNEE PAIN, CHRONIC 01/15/2009  . BACK PAIN, CHRONIC 01/15/2009  . Hx of adenomatous colonic polyps 01/15/2009   Thank you,  Genene Churn, [place]  [facility] 08/14/2019, 11:03 AM  [place] [address] [place], [place], [address] Phone: [phone]   Fax:  [phone]  Name: KONNIE NOFFSINGER MRN: [id number] Date of Birth: 03-18-1961

## 2019-08-20 ENCOUNTER — Telehealth: Payer: Self-pay | Admitting: Family Medicine

## 2019-08-20 NOTE — Telephone Encounter (Signed)
FL2 form dropped off. Placed in providers folder

## 2019-08-21 ENCOUNTER — Other Ambulatory Visit (HOSPITAL_COMMUNITY): Payer: Self-pay | Admitting: Psychiatry

## 2019-08-21 ENCOUNTER — Other Ambulatory Visit: Payer: Self-pay | Admitting: Family Medicine

## 2019-08-22 ENCOUNTER — Other Ambulatory Visit: Payer: Self-pay

## 2019-08-22 NOTE — Patient Outreach (Signed)
[facility]) Care Management  08/22/2019  [name] [birth year] [id number]   Medication Adherence call to [name] [facility] Identifiers Verify spoke with patient she is past due on Atorvastatin 10 mg and Lisinopril 2.5 mg,patient explain she is taking both medication twice daily and has enough until she receive her next order on the 20 th of each month. [name] is showing past due under [place].   [facility] Management Direct Dial [phone]  Fax [phone] [email or web address]

## 2019-08-23 NOTE — Telephone Encounter (Signed)
90-day 1 each with 1 refill

## 2019-08-26 ENCOUNTER — Encounter (HOSPITAL_COMMUNITY): Payer: Self-pay | Admitting: Psychiatry

## 2019-08-26 ENCOUNTER — Other Ambulatory Visit: Payer: Self-pay

## 2019-08-26 ENCOUNTER — Ambulatory Visit (INDEPENDENT_AMBULATORY_CARE_PROVIDER_SITE_OTHER): Payer: Medicare Other | Admitting: Psychiatry

## 2019-08-26 DIAGNOSIS — F251 Schizoaffective disorder, depressive type: Secondary | ICD-10-CM | POA: Diagnosis not present

## 2019-08-26 MED ORDER — LAMOTRIGINE 100 MG PO TABS: 100.0000 mg | ORAL_TABLET | Freq: Two times a day (BID) | ORAL | 2 refills | Status: DC

## 2019-08-26 MED ORDER — BENZTROPINE MESYLATE 1 MG PO TABS: 1.0000 mg | ORAL_TABLET | Freq: Every day | ORAL | 2 refills | Status: DC

## 2019-08-26 MED ORDER — TRAZODONE HCL 100 MG PO TABS: 200.0000 mg | ORAL_TABLET | Freq: Every day | ORAL | 2 refills | Status: DC

## 2019-08-26 MED ORDER — PALIPERIDONE ER 9 MG PO TB24: 9.0000 mg | ORAL_TABLET | Freq: Every day | ORAL | 2 refills | Status: DC

## 2019-08-26 MED ORDER — SERTRALINE HCL 100 MG PO TABS: 100.0000 mg | ORAL_TABLET | Freq: Every day | ORAL | 2 refills | Status: DC
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
## 2019-09-04 NOTE — Telephone Encounter (Signed)
I finished the form thank you

## 2019-09-17 ENCOUNTER — Ambulatory Visit: Payer: Medicare Other | Admitting: Family Medicine

## 2019-09-25 ENCOUNTER — Other Ambulatory Visit: Payer: Self-pay

## 2019-09-25 ENCOUNTER — Encounter: Payer: Medicare Other | Attending: Family Medicine | Admitting: Nutrition

## 2019-09-25 ENCOUNTER — Encounter: Payer: Self-pay | Admitting: Nutrition

## 2019-09-25 VITALS — Wt 237.0 lb

## 2019-09-25 DIAGNOSIS — I10 Essential (primary) hypertension: Secondary | ICD-10-CM

## 2019-09-25 DIAGNOSIS — E782 Mixed hyperlipidemia: Secondary | ICD-10-CM | POA: Insufficient documentation

## 2019-09-25 DIAGNOSIS — E669 Obesity, unspecified: Secondary | ICD-10-CM | POA: Diagnosis present

## 2019-09-25 DIAGNOSIS — E119 Type 2 diabetes mellitus without complications: Secondary | ICD-10-CM | POA: Diagnosis not present

## 2019-09-25 NOTE — Progress Notes (Signed)
Telephone visit, follow up DM Medical Nutrition Therapy:  Appt start time: 1400 end time:  9924   Assessment:  Primary concerns today DM Type 2: . F/u   LIves by herself and has an aide come in 3 days per week . No real changes.  Hasn't eaten today. Golden Circle a few weeks ago in kitchen. Golden Circle out of the bed a few weeks ago according to her.. Scheduled to have knee surgery by Dr. Aline Brochure next month. Says she has issues swallowing foods. Has dentures but can't chew well.. Drinking sodas. Cognitive function and mobility limitations may interfer with her self care. May not be safe to live at home by herself. Not taking any medications for DM...    A1C 6.2%.Not on any medications for Dm at this time.  Lab Results  Component Value Date   HGBA1C 6.2 (H) 02/12/2019   CMP Latest Ref Rng & Units 04/08/2019 03/25/2019 03/24/2019  Glucose 65 - 99 mg/dL 91 90 99  BUN 6 - 24 mg/dL 4(L) 6 5(L)  Creatinine 0.57 - 1.00 mg/dL 0.84 0.59 0.51  Sodium 134 - 144 mmol/L 137 142 138  Potassium 3.5 - 5.2 mmol/L 3.7 4.0 3.2(L)  Chloride 96 - 106 mmol/L 94(L) 109 102  CO2 20 - 29 mmol/L 31(H) 25 27  Calcium 8.7 - 10.2 mg/dL 9.8 8.4(L) 8.7(L)  Total Protein 6.0 - 8.5 g/dL 7.0 6.0(L) 6.6  Total Bilirubin 0.0 - 1.2 mg/dL 0.3 0.5 0.4  Alkaline Phos 39 - 117 IU/L 110 78 79  AST 0 - 40 IU/L 21 103(H) 111(H)  ALT 0 - 32 IU/L 16 82(H) 59(H)   Lipid Panel     Component Value Date/Time   CHOL 109 02/12/2019 1442   TRIG 89 02/12/2019 1442   HDL 53 02/12/2019 1442   CHOLHDL 2.1 02/12/2019 1442   CHOLHDL 2.9 07/20/2014 1214   VLDL 21 07/20/2014 1214   LDLCALC 38 02/12/2019 1442    Wt Readings from Last 3 Encounters:  06/19/19 232 lb 9.6 oz (105.5 kg)  06/19/19 239 lb 3.2 oz (108.5 kg)  05/20/19 239 lb (108.4 kg)   Ht Readings from Last 3 Encounters:  06/19/19 5\' 9"  (1.753 m)  05/02/19 5\' 10"  (1.778 m)  04/02/19 5\' 9"  (1.753 m)   There is no height or weight on file to calculate BMI. @BMIFA @ Facility age  limit for growth percentiles is 20 years. Facility age limit for growth percentiles is 20 years.  Preferred Learning Style:  Visual, verbal, hands on.  Learning Readiness:  Ready  Change in progress   MEDICATIONS:   DIETARY INTAKE:  24-hr recall:  B ( AM):  Snk ( AM): eggs and toast L ( PM):  Sandwich   D ( PM): Chicken, eggs, 3, water, potatoes, skipped;   baked chicken, string beans, water Snk ( PM):  Beverages: water   Usual physical activity walking some   Estimated energy needs: 1200  calories 135 g carbohydrates 90 g protein 33 g fat  Progress Towards Goal(s):  In progress.   Nutritional Diagnosis:  NB-1.1 Food and nutrition-related knowledge deficit As related to Diabetes.  As evidenced by  A1C 6.6%..    Intervention:  Nutrition Nutrition and Diabetes education provided on My Plate, CHO counting, meal planning, portion sizes, timing of meals, avoiding snacks between meals unless having a low blood sugar, target ranges for A1C and blood sugars, signs/symptoms and treatment of hyper/hypoglycemia, monitoring blood sugars, taking medications as prescribed, benefits of exercising 30 minutes  per day and prevention of complications of DM. HIgh Fiber Foods.  Goals  Eat three meals per day Do not skip meals. Do not drink sodas. Drink only water 4 bottles per day. Eat more fresh fruits and vegetables. Get your aide to get some denture cream so you can wear your dentures. Talk to Dr. Wolfgang Phoenix about swallowing issues.  Teaching Method Utilized:  Visual Auditory Hands on  Handouts given during visit include:  The Plate Method   Meal Plan Card  Diabetes Instructions     Barriers to learning/adherence to lifestyle change: CVA, HA, Limited mobility.  Demonstrated degree of understanding via:  Teach Back   Monitoring/Evaluation:  Dietary intake, and body weight in 3 month(s).. She may need assisted living or a full time caretaker for her safety. Recommend  to follow up with swallowing issues with her PCP.

## 2019-09-25 NOTE — Patient Instructions (Signed)
Goals  Eat three meals per day Do not skip meals. Do not drink sodas. Drink only water 4 bottles per day. Eat more fresh fruits and vegetables. Get your aide to get some denture cream so you can wear your dentures. Talk to Dr. Wolfgang Phoenix about swallowing issues.

## 2019-10-03 ENCOUNTER — Encounter: Payer: Self-pay | Admitting: Family Medicine

## 2019-10-03 ENCOUNTER — Other Ambulatory Visit: Payer: Self-pay

## 2019-10-03 ENCOUNTER — Ambulatory Visit (INDEPENDENT_AMBULATORY_CARE_PROVIDER_SITE_OTHER): Payer: Medicare Other | Admitting: Family Medicine

## 2019-10-03 VITALS — BP 128/80 | Temp 97.3°F | Ht 69.0 in | Wt 234.0 lb

## 2019-10-03 DIAGNOSIS — I10 Essential (primary) hypertension: Secondary | ICD-10-CM | POA: Diagnosis not present

## 2019-10-03 DIAGNOSIS — R296 Repeated falls: Secondary | ICD-10-CM | POA: Diagnosis not present

## 2019-10-03 DIAGNOSIS — E639 Nutritional deficiency, unspecified: Secondary | ICD-10-CM

## 2019-10-03 DIAGNOSIS — M170 Bilateral primary osteoarthritis of knee: Secondary | ICD-10-CM

## 2019-10-03 DIAGNOSIS — R5383 Other fatigue: Secondary | ICD-10-CM | POA: Diagnosis not present

## 2019-10-03 DIAGNOSIS — E785 Hyperlipidemia, unspecified: Secondary | ICD-10-CM | POA: Diagnosis not present

## 2019-10-03 DIAGNOSIS — R270 Ataxia, unspecified: Secondary | ICD-10-CM

## 2019-10-03 DIAGNOSIS — E119 Type 2 diabetes mellitus without complications: Secondary | ICD-10-CM

## 2019-10-03 DIAGNOSIS — R634 Abnormal weight loss: Secondary | ICD-10-CM

## 2019-10-03 NOTE — Progress Notes (Signed)
Subjective:    Patient ID: [name], female    DOB: Sep 07, 1960, 59 y.o.   MRN: [id number]  Pt arrives with social worker [name].   Hyperlipidemia This is a chronic problem. Pertinent negatives include no chest pain or shortness of breath. Treatments tried: atorvastatin. Compliance problems: takes meds every day, seeing dietician every 3 months,       Pain in top and low part of back. Started months ago. After a fall in the bathtub.   Bilateral knee and leg pain.   Golden Circle out of the bed about 3 weeks ago. States she hit her back and head.   Social worker states she is dizzy a lot and she thinks that why she is falling.   Review of Systems  Constitutional: Negative for activity change, appetite change and fatigue.  HENT: Negative for congestion and rhinorrhea.   Respiratory: Negative for cough and shortness of breath.   Cardiovascular: Negative for chest pain and leg swelling.  Gastrointestinal: Negative for abdominal pain and diarrhea.  Endocrine: Negative for polydipsia and polyphagia.  Musculoskeletal: Positive for arthralgias and back pain.  Skin: Negative for color change.  Neurological: Positive for dizziness. Negative for weakness.  Psychiatric/Behavioral: Negative for behavioral problems and confusion.       Objective:   Physical Exam Vitals reviewed.  Constitutional:      General: She is not in acute distress. HENT:     Head: Normocephalic and atraumatic.  Eyes:     General:        Right eye: No discharge.        Left eye: No discharge.  Neck:     Trachea: No tracheal deviation.  Cardiovascular:     Rate and Rhythm: Normal rate and regular rhythm.     Heart sounds: Normal heart sounds. No murmur.  Pulmonary:     Effort: Pulmonary effort is normal. No respiratory distress.     Breath sounds: Normal breath sounds.  Lymphadenopathy:     Cervical: No cervical adenopathy.  Skin:    General: Skin is warm and dry.  Neurological:     Mental Status: She  is alert.     Coordination: Coordination normal.  Psychiatric:        Behavior: Behavior normal.           Assessment & Plan:  1. Hyperlipidemia, unspecified hyperlipidemia type Hyperlipidemia lipid profile reviewed Ordered - Lipid panel  2. Hypertension, unspecified type Blood pressure good control continue current measures check metabolic 7 - Basic metabolic panel  3. Other fatigue Significant fatigue tiredness check CBC await results - Hepatic function panel - CBC with Differential/Platelet  4. Diabetes mellitus without complication ([place]) Diabetes has been under good control check A1c watch diet stay active - Hemoglobin A1c  5. Frequent falls Use walker use cane to minimize risk of falling has already done physical therapy for ataxia  6. Ataxia Her ataxia I believe is related more so into poor balance and also a lack of activity Also could be related to her psychiatric meds  7. Poor nutrition Poor nutrition related into bad habits not fixing food and not having proper support encouraged her to do better.  It is also possible her psychiatric medicines may be playing a role patient was encouraged to do a better job to take care of her self 8. Primary osteoarthritis of both knees Apparently has significant problems with her knees I recommend anti-inflammatory when necessary and trying to stay active and keep her  weight down  Follow-up 4 months lab work ordered await results

## 2019-10-04 LAB — HEPATIC FUNCTION PANEL
ALT: 6 IU/L (ref 0–32)
AST: 13 IU/L (ref 0–40)
Albumin: 4 g/dL (ref 3.8–4.9)
Alkaline Phosphatase: 125 IU/L — ABNORMAL HIGH (ref 39–117)
Bilirubin Total: <0.2 mg/dL (ref 0.0–1.2)
Bilirubin, Direct: 0.09 mg/dL (ref 0.00–0.40)
Total Protein: 6.9 g/dL (ref 6.0–8.5)

## 2019-10-04 LAB — CBC WITH DIFFERENTIAL/PLATELET
Basophils Absolute: 0.1 10*3/uL (ref 0.0–0.2)
Basos: 1 %
EOS (ABSOLUTE): 0 10*3/uL (ref 0.0–0.4)
Eos: 1 %
Hematocrit: 39.2 % (ref 34.0–46.6)
Hemoglobin: 13.1 g/dL (ref 11.1–15.9)
Immature Grans (Abs): 0 10*3/uL (ref 0.0–0.1)
Immature Granulocytes: 0 %
Lymphocytes Absolute: 1.8 10*3/uL (ref 0.7–3.1)
Lymphs: 37 %
MCH: 29.4 pg (ref 26.6–33.0)
MCHC: 33.4 g/dL (ref 31.5–35.7)
MCV: 88 fL (ref 79–97)
Monocytes Absolute: 0.6 10*3/uL (ref 0.1–0.9)
Monocytes: 12 %
Neutrophils Absolute: 2.5 10*3/uL (ref 1.4–7.0)
Neutrophils: 49 %
Platelets: 232 10*3/uL (ref 150–450)
RBC: 4.46 x10E6/uL (ref 3.77–5.28)
RDW: 13.6 % (ref 11.7–15.4)
WBC: 5 10*3/uL (ref 3.4–10.8)

## 2019-10-04 LAB — BASIC METABOLIC PANEL
BUN/Creatinine Ratio: 5 — ABNORMAL LOW (ref 9–23)
BUN: 4 mg/dL — ABNORMAL LOW (ref 6–24)
CO2: 33 mmol/L — ABNORMAL HIGH (ref 20–29)
Calcium: 9.2 mg/dL (ref 8.7–10.2)
Chloride: 92 mmol/L — ABNORMAL LOW (ref 96–106)
Creatinine, Ser: 0.79 mg/dL (ref 0.57–1.00)
GFR calc Af Amer: 97 mL/min/{1.73_m2} (ref >59)
GFR calc non Af Amer: 84 mL/min/{1.73_m2} (ref >59)
Glucose: 73 mg/dL (ref 65–99)
Potassium: 3.3 mmol/L — ABNORMAL LOW (ref 3.5–5.2)
Sodium: 140 mmol/L (ref 134–144)

## 2019-10-04 LAB — HEMOGLOBIN A1C
Est. average glucose Bld gHb Est-mCnc: 137 mg/dL
Hgb A1c MFr Bld: 6.4 % — ABNORMAL HIGH (ref 4.8–5.6)

## 2019-10-04 LAB — LIPID PANEL
Chol/HDL Ratio: 1.8 ratio (ref 0.0–4.4)
Cholesterol, Total: 119 mg/dL (ref 100–199)
HDL: 66 mg/dL (ref >39)
LDL Chol Calc (NIH): 39 mg/dL (ref 0–99)
Triglycerides: 66 mg/dL (ref 0–149)
VLDL Cholesterol Cal: 14 mg/dL (ref 5–40)

## 2019-10-06 ENCOUNTER — Other Ambulatory Visit: Payer: Self-pay

## 2019-10-06 MED ORDER — POTASSIUM CHLORIDE ER 10 MEQ PO TBCR: EXTENDED_RELEASE_TABLET | ORAL | 5 refills | Status: DC

## 2019-10-20 ENCOUNTER — Other Ambulatory Visit: Payer: Self-pay | Admitting: Family Medicine

## 2019-11-17 NOTE — Progress Notes (Signed)
Primary Care Physician:  Kathyrn Drown, MD  Primary GI: Dr. Gala Romney   Patient Location: Home   Provider Location: [facility] office   Reason for Visit: Follow-up    Persons present on the virtual encounter, with roles: Patient and NP   Total time (minutes) spent on medical discussion: 10 minutes   Due to COVID-19, visit was conducted using virtual method.  Visit was requested by patient.  Virtual Visit via Telephone Note Due to COVID-19, visit is conducted virtually and was requested by patient.   I connected with [name] on 11/18/19 at  9:30 AM EDT by telephone and verified that I am speaking with the correct person using two identifiers.   I discussed the limitations, risks, security and privacy concerns of performing an evaluation and management service by telephone and the availability of in person appointments. I also discussed with the patient that there may be a patient responsible charge related to this service. The patient expressed understanding and agreed to proceed.  Chief Complaint  Patient presents with  . Blood In Stools    cramping in stomach and side, poor appetite,constipation     History of Present Illness:  59 year old female with history of GERD, IBS, dysphagia, colonoscopy due in 2023 (history of adenomas). EGD with empiric dilation completed Oct 2020. Dysphagia at last visit Jan 2021: relative narrowing of GE junction, temporarily obstructed 12.5 mm tablet but passed following multiple swallows of water and thin barium. Esophageal dysmotility. Here for follow-up.   Choked last night with chicken and was scared. Has been out of her Aciphex for awhile now.   Has had some rectal bleeding. States comes between her legs for a few weeks. Occurs with BMs. Some constipation at times. Doesn't have an appetite. Doesn't want to drink liquids. Doesn't have any more reflux medication. Been out for awhile now. Aciphex BID. Was eating a piece of baked chicken.  Kept on popping up and was scared. Some itching/burning in rectum.   BMs: every few days. Sometimes having to strain. Still taking Bentyl. BID. Noted improvement with dilation last year.    Past Medical History:  Diagnosis Date  . Anxiety   . Diabetes mellitus   . Diabetes mellitus, type II ([place])   . HTN (hypertension)   . Hyperglycemia   . IBS (irritable bowel syndrome)   . Lung nodules    right, followed by PCP, PET 11/2011  . Mental retardation   . MI (myocardial infarction) ([place])   . Migraines   . Sleep apnea      Past Surgical History:  Procedure Laterality Date  . ABDOMINAL HYSTERECTOMY    . COLONOSCOPY  11/2007   hyperplastic polyps, prior hx of adenomas   . COLONOSCOPY  05/2010   incomplete due to poor prep, hyperplastic rectal polyp  . COLONOSCOPY  05/05/2002   Dimunitive polyps in the rectum and left colon, cold    biopsied/removed.  Scattered few left-sided diverticula.  Regular colonic   mucosa appeared normal  . COLONOSCOPY N/A 05/28/2013   Rourk: mulitple tubular adenomas removed. next tcs 05/2016  . COLONOSCOPY WITH PROPOFOL N/A 09/07/2016   Dr. Gala Romney: For hyperplastic polyps removed. Next colonoscopy March 2023 given history of adenomatous colon polyps in the past.  . ESOPHAGOGASTRODUODENOSCOPY  08/2007   moderate sized hiatal hernia  . ESOPHAGOGASTRODUODENOSCOPY  05/2010   noncritical schatzki ring s/p 95F  . ESOPHAGOGASTRODUODENOSCOPY (EGD) WITH ESOPHAGEAL DILATION N/A 02/06/2013   GBT:DVVOHY esophagus-s/p dilation  up to a 90 [place] size with [facility] dilators.  Hiatal hernia  . ESOPHAGOGASTRODUODENOSCOPY (EGD) WITH PROPOFOL N/A 09/07/2016   Dr. Gala Romney: Normal, status post empiric dilation of the esophagus for history of dysphagia  . ESOPHAGOGASTRODUODENOSCOPY (EGD) WITH PROPOFOL N/A 05/02/2019   Normal esophagus s/p dilation, normal stomach, normal duodenum  . EXTERNAL EAR SURGERY     bilateral  . FOOT SURGERY    . GLAUCOMA SURGERY    . MALONEY DILATION N/A  09/07/2016   Procedure: Venia Minks DILATION;  Surgeon: Daneil Dolin, MD;  [facility];  Service: Endoscopy;  Laterality: N/A;  . Venia Minks DILATION N/A 05/02/2019   Procedure: Venia Minks DILATION;  Surgeon: Daneil Dolin, MD;  Location: [facility];  Service: Endoscopy;  Laterality: N/A;  . POLYPECTOMY  09/07/2016   Procedure: POLYPECTOMY;  Surgeon: Daneil Dolin, MD;  Location: [facility];  Service: Endoscopy;;  sigmoid colon x4  . small bowel capsule  10/2007   normal     Current Meds  Medication Sig  . atorvastatin (LIPITOR) 10 MG tablet TAKE 1 TABLET BY MOUTH ONCE A DAY.  . benztropine (COGENTIN) 1 MG tablet Take 1 tablet (1 mg total) by mouth daily.  Marland Kitchen dicyclomine (BENTYL) 10 MG capsule May use one up to 3 times a day as needed for abdominal cramps  . lamoTRIgine (LAMICTAL) 100 MG tablet Take 1 tablet (100 mg total) by mouth 2 (two) times daily.  Marland Kitchen lisinopril (ZESTRIL) 2.5 MG tablet TAKE (1) TABLET BY MOUTH EACH MORNING.  . paliperidone (INVEGA) 9 MG 24 hr tablet Take 1 tablet (9 mg total) by mouth daily.  . potassium chloride (KLOR-CON) 10 MEQ tablet Take 2 tablets po in the morning and one tablet po in the evening.  . propranolol (INDERAL) 10 MG tablet TAKE 1 TABLET BY MOUTH THREE TIMES A DAY.  Marland Kitchen torsemide (DEMADEX) 20 MG tablet Take 1 tablet (20 mg total) by mouth daily.     Family History  Problem Relation Age of Onset  . Stroke Mother   . Heart attack Father   . Schizophrenia Other   . Drug abuse Other   . Alcohol abuse Other   . Colon cancer Other        aunt  . Obesity Other   . COPD Other   . GER disease Other   . Diabetes type II Other   . Anxiety disorder Other   . Depression Other   . Depression Sister   . Schizophrenia Sister   . Liver disease Neg Hx   . Inflammatory bowel disease Neg Hx     Social History   Socioeconomic History  . Marital status: Divorced    Spouse name: Not on file  . Number of children: Not on file  . Years of  education: Not on file  . Highest education level: High school graduate  Occupational History  . Occupation: disabled    Fish farm manager: UNEMPLOYED  Tobacco Use  . Smoking status: Current Every Day Smoker    Packs/day: 0.25    Years: 40.00    Pack years: 10.00    Types: Cigarettes  . Smokeless tobacco: Never Used  Substance and Sexual Activity  . Alcohol use: No  . Drug use: No  . Sexual activity: Yes    Partners: Male    Birth control/protection: None    Comment: boyfriend  Other Topics Concern  . Not on file  Social History Narrative  . Not on file  Social Determinants of Health   Financial Resource Strain: Low Risk   . Difficulty of Paying Living Expenses: Not hard at all  Food Insecurity: No Food Insecurity  . Worried About Charity fundraiser in the Last Year: Never true  . Ran Out of Food in the Last Year: Never true  Transportation Needs: No Transportation Needs  . Lack of Transportation (Medical): No  . Lack of Transportation (Non-Medical): No  Physical Activity: Insufficiently Active  . Days of Exercise per Week: 7 days  . Minutes of Exercise per Session: 20 min  Stress: No Stress Concern Present  . Feeling of Stress : Only a little  Social Connections: Unknown  . Frequency of Communication with Friends and Family: Not asked  . Frequency of Social Gatherings with Friends and Family: Not on file  . Attends Religious Services: Not on file  . Active Member of Clubs or Organizations: Not on file  . Attends Archivist Meetings: Not on file  . Marital Status: Not on file       Review of Systems: Gen: Denies fever, chills, anorexia. Denies fatigue, weakness, weight loss.  CV: Denies chest pain, palpitations, syncope, peripheral edema, and claudication. Resp: Denies dyspnea at rest, cough, wheezing, coughing up blood, and pleurisy. GI: see HPI Derm: Denies rash, itching, dry skin Psych: Denies depression, anxiety, memory loss, confusion. No homicidal or  suicidal ideation.  Heme: Denies bruising, bleeding, and enlarged lymph nodes.  Observations/Objective: No distress. Unable to perform physical exam due to telephone encounter. No video available.   Assessment and Plan: 59 year old female with history of GERD, IBS, dysphagia, adenomas, presenting via telephone visit today for routine follow-up.   GERD: uncontrolled. Out of Aciphex. Resume Aciphex BID. May be contributing to dysphagia.   Dysphagia: empiric dilation Oct 2020. BPE thereafter with relative narrowing of GE junction, temporarily obstructing tablet but passing after multiple swallows. Dysmotility noted. She notes improvement with dilation in past. Will pursue early interval EGD/dilation with her significant symptoms and may need to refer for manometry thereafter.   IBS: now more constipation-predominant. She also notes rectal bleeding with BMs and clinically reporting symptomatic hemorrhoids. Unfortunately, as this is a telephone visit, I am unable to do an exam. I have asked her to stop Bentyl, call with update, and use Anusol cream BID. She will come to office physically in 4 weeks for exam. Low threshold for colonoscopy (last in 2018).   Proceed with upper endoscopy/dilation in the near future with Dr. Gala Romney. The risks, benefits, and alternatives have been discussed in detail with patient. They have stated understanding and desire to proceed. Propofol due to polypharmacy.    Follow Up Instructions: See AVS    I discussed the assessment and treatment plan with the patient. The patient was provided an opportunity to ask questions and all were answered. The patient agreed with the plan and demonstrated an understanding of the instructions.   The patient was advised to call back or seek an in-person evaluation if the symptoms worsen or if the condition fails to improve as anticipated.  I provided  10 minutes of non-face-to-face time during this encounter.  Annitta Needs, PhD,  ANP-BC [facility]

## 2019-11-18 ENCOUNTER — Ambulatory Visit (INDEPENDENT_AMBULATORY_CARE_PROVIDER_SITE_OTHER): Payer: Medicare Other | Admitting: Gastroenterology

## 2019-11-18 ENCOUNTER — Encounter: Payer: Self-pay | Admitting: Gastroenterology

## 2019-11-18 ENCOUNTER — Other Ambulatory Visit: Payer: Self-pay

## 2019-11-18 ENCOUNTER — Telehealth: Payer: Self-pay | Admitting: Gastroenterology

## 2019-11-18 ENCOUNTER — Ambulatory Visit (INDEPENDENT_AMBULATORY_CARE_PROVIDER_SITE_OTHER): Payer: Medicare Other | Admitting: Family Medicine

## 2019-11-18 VITALS — BP 128/72 | Temp 97.7°F | Wt 230.0 lb

## 2019-11-18 DIAGNOSIS — R1310 Dysphagia, unspecified: Secondary | ICD-10-CM

## 2019-11-18 DIAGNOSIS — E639 Nutritional deficiency, unspecified: Secondary | ICD-10-CM | POA: Diagnosis not present

## 2019-11-18 DIAGNOSIS — K219 Gastro-esophageal reflux disease without esophagitis: Secondary | ICD-10-CM

## 2019-11-18 DIAGNOSIS — R5383 Other fatigue: Secondary | ICD-10-CM

## 2019-11-18 DIAGNOSIS — K625 Hemorrhage of anus and rectum: Secondary | ICD-10-CM

## 2019-11-18 DIAGNOSIS — K5900 Constipation, unspecified: Secondary | ICD-10-CM | POA: Diagnosis not present

## 2019-11-18 DIAGNOSIS — M170 Bilateral primary osteoarthritis of knee: Secondary | ICD-10-CM | POA: Diagnosis not present

## 2019-11-18 MED ORDER — RABEPRAZOLE SODIUM 20 MG PO TBEC: 20.0000 mg | DELAYED_RELEASE_TABLET | Freq: Two times a day (BID) | ORAL | 11 refills | Status: DC

## 2019-11-18 MED ORDER — HYDROCORTISONE (PERIANAL) 2.5 % EX CREA: 1.0000 "application " | TOPICAL_CREAM | Freq: Two times a day (BID) | CUTANEOUS | 1 refills | Status: DC

## 2019-11-18 NOTE — Telephone Encounter (Signed)
Can we please call Foye Spurling (sponsor of patient) and update about office visit today?  Plan is following:  1. EGD/dilation in near future  2. I have sent in Aciphex BID  3. Anusol cream BID to rectum  4. Stop Bentyl as likely contributing to constipation  5. Needs to be seen in office in 4 weeks to evaluate rectal issues

## 2019-11-18 NOTE — Telephone Encounter (Signed)
Spoke with [employer]. She is aware of AB recommendations and scheduled pts 4 week f/u.

## 2019-11-18 NOTE — Progress Notes (Signed)
   Subjective:    Patient ID: [name], female    DOB: February 08, 1961, 59 y.o.   MRN: [id number]  HPI  Patient comes in today with complaints of worsening leg and knee pain. Relates a lot of knee pain leg pain with squatting and standing up. Interested in discussing nursing home/care.   Patient also complains of poor appetite and trouble sleeping x several months.  Patient experiencing some rectal bleeding/pain with bowel movements and other times.    Review of Systems  Constitutional: Negative for activity change and appetite change.  HENT: Negative for congestion and rhinorrhea.   Respiratory: Negative for cough and shortness of breath.   Cardiovascular: Negative for chest pain and leg swelling.  Gastrointestinal: Negative for abdominal pain, nausea and vomiting.  Skin: Negative for color change.  Neurological: Negative for dizziness and weakness.  Psychiatric/Behavioral: Negative for agitation and confusion.       Objective:   Physical Exam  Lungs clear respiratory rate normal heart regular no murmurs extremities no edema skin warm dry      Assessment & Plan:  1. Other fatigue Patient suffers with fatigue relates that she feels tired states she does not sleep well her social worker wonders if it has to do with her psychiatric meds we will bring this to the attention of psychiatry but for now I do recommend that she stick with her regimen until her next visit with psychiatry  2. Poor nutrition The patient is not doing a good job eating I encourage her to start eating breakfast lunch and dinner unfortunately she does not have strong support right at the apartment and therefore she can pretty much do as she pleases and as a result she is really not eating much  3. Primary osteoarthritis of both knees She does have significant osteoarthritis of both knees causes her pain and discomfort Dr. Aline Brochure previously talked to her about surgery.  The patient is willing to go ahead  with surgery.  I believe the patient medically is stable enough to do surgery.  She is somewhat higher risk because of her weight.  Her diabetes under good control.  She does have some mental health issues which may make rehab a little more challenging.  But she is motivated to get better to the point of returning back to her apartment  Patient would need placement with short-term rehab after surgery she would be unable to take care of herself at her apartment until she graduates from rehab 4. Rectal bleeding Patient mentions this as a" by the way" As a result she states at times when she has a bowel movement there is blood in it.  She was seen today by gastroenterology but she says she mentioned it to them.  Currently there notes are not dictated.  I will send a message regarding this to them so hopefully they will do follow-up regarding this  It should be noted that psychiatry Dr. Harrington Challenger responded did not feel that the medications were causing the appetite issue or the sleep issue  GI responded that they are aware of her blood when she goes to the bathroom they are bringing her to the office in a month and checking her for hemorrhoids  Message was sent to Dr. Aline Brochure regarding knee surgery

## 2019-11-18 NOTE — Patient Instructions (Addendum)
We are scheduling you for an upper endoscopy with dilation in the near future.   I have sent in Aciphex to take twice a day, 30 minutes before breakfast and dinner.   Sop Bentyl for now. This can cause constipation. Please let us know if you still have constipation after this.  I sent a rectal cream to the pharmacy to use twice a day as needed.   I would like to see you in the office in about 4 weeks for an exam.    I enjoyed talking with you again today! As you know, I value our relationship and want to provide genuine, compassionate, and quality care. I welcome your feedback. If you receive a survey regarding your visit,  I greatly appreciate you taking time to fill this out. See you next time!  Annitta Needs, PhD, ANP-BC [facility]

## 2019-11-19 ENCOUNTER — Telehealth: Payer: Self-pay

## 2019-11-19 NOTE — Telephone Encounter (Signed)
Called pt's social worker Network engineer [name]), EGD/DIL w/Propofol w/RMR scheduled for 01/15/20 at 12:45pm. Social worker was unable to do dates offered in June. She will come by office to pickup pt's instructions. Orders entered.  PA for EGD/DIL submitted via [facility] website. Case approved. PA# [id number], valid 01/15/20-04/14/20.

## 2019-11-19 NOTE — Telephone Encounter (Signed)
LMOVM to inform social worker of pre-op/COVID test appt 01/13/20 at 11:00am. Letter mailed.

## 2019-11-27 ENCOUNTER — Encounter: Payer: Self-pay | Admitting: Family Medicine

## 2019-12-03 ENCOUNTER — Telehealth: Payer: Self-pay | Admitting: *Deleted

## 2019-12-03 NOTE — Telephone Encounter (Signed)
Pt consented to a telephone visit on 11/18/19.

## 2019-12-03 NOTE — Telephone Encounter (Signed)
[name], you are scheduled for a virtual visit with your provider today.  Just as we do with appointments in the office, we must obtain your consent to participate.  Your consent will be active for this visit and any virtual visit you may have with one of our providers in the next 365 days.  If you have a MyChart account, I can also send a copy of this consent to you electronically.  All virtual visits are billed to your insurance company just like a traditional visit in the office.  As this is a virtual visit, video technology does not allow for your provider to perform a traditional examination.  This may limit your provider's ability to fully assess your condition.  If your provider identifies any concerns that need to be evaluated in person or the need to arrange testing such as labs, EKG, etc, we will make arrangements to do so.  Although advances in technology are sophisticated, we cannot ensure that it will always work on either your end or our end.  If the connection with a video visit is poor, we may have to switch to a telephone visit.  With either a video or telephone visit, we are not always able to ensure that we have a secure connection.   I need to obtain your verbal consent now.   Are you willing to proceed with your visit today?

## 2019-12-05 ENCOUNTER — Encounter: Payer: Self-pay | Admitting: Orthopedic Surgery

## 2019-12-05 ENCOUNTER — Other Ambulatory Visit: Payer: Self-pay

## 2019-12-05 ENCOUNTER — Ambulatory Visit (INDEPENDENT_AMBULATORY_CARE_PROVIDER_SITE_OTHER): Payer: Medicare Other | Admitting: Orthopedic Surgery

## 2019-12-05 VITALS — BP 132/82 | HR 78 | Ht 69.0 in | Wt 234.0 lb

## 2019-12-05 DIAGNOSIS — G8929 Other chronic pain: Secondary | ICD-10-CM

## 2019-12-05 DIAGNOSIS — M170 Bilateral primary osteoarthritis of knee: Secondary | ICD-10-CM | POA: Diagnosis not present

## 2019-12-05 NOTE — Progress Notes (Signed)
Chief Complaint  Patient presents with  . Knee Pain    bilateral injections help a little   . Leg Pain    both legs painful and back painful, could not see Neurosurgeon due to covid restrictions    59 year old female with multiple social issues.  She is not a candidate for knee surgery.  She also has back pain with previous CT scan done back in July 2020 which showed no acute lumbar spine pathology  Her x-rays show some disc space narrowing at L5-S1 without compressive lesion or spinal stenosis  Part of the process of evaluating patients for knee surgery include social situation and ability to follow instructions postop ability to do physical therapy  If the patient wants to have surgery then she should have it at a tertiary care facility I cannot to do the surgery.  We did repeat her injections because she said that did help her pain  Bilateral knee injections  Procedure note for bilateral knee injections  Procedure note left knee injection verbal consent was obtained to inject left knee joint  Timeout was completed to confirm the site of injection  The medications used were 40 mg of Depo-Medrol and 1% lidocaine 3 cc  Anesthesia was provided by ethyl chloride and the skin was prepped with alcohol.  After cleaning the skin with alcohol a 20-gauge needle was used to inject the left knee joint. There were no complications. A sterile bandage was applied.   Procedure note right knee injection verbal consent was obtained to inject right knee joint  Timeout was completed to confirm the site of injection  The medications used were 40 mg of Depo-Medrol and 1% lidocaine 3 cc  Anesthesia was provided by ethyl chloride and the skin was prepped with alcohol.  After cleaning the skin with alcohol a 20-gauge needle was used to inject the right knee joint. There were no complications. A sterile bandage was applied.  Encounter Diagnoses  Name Primary?  . Primary osteoarthritis of both  knees Yes  . Bilateral chronic knee pain

## 2019-12-10 ENCOUNTER — Other Ambulatory Visit: Payer: Self-pay | Admitting: Family Medicine

## 2019-12-17 ENCOUNTER — Other Ambulatory Visit: Payer: Self-pay | Admitting: Cardiothoracic Surgery

## 2019-12-17 DIAGNOSIS — R911 Solitary pulmonary nodule: Secondary | ICD-10-CM

## 2019-12-19 ENCOUNTER — Other Ambulatory Visit (HOSPITAL_COMMUNITY): Payer: Self-pay | Admitting: Family Medicine

## 2019-12-19 DIAGNOSIS — Z1231 Encounter for screening mammogram for malignant neoplasm of breast: Secondary | ICD-10-CM

## 2019-12-22 ENCOUNTER — Telehealth: Payer: Self-pay | Admitting: Orthopedic Surgery

## 2019-12-22 DIAGNOSIS — M48061 Spinal stenosis, lumbar region without neurogenic claudication: Secondary | ICD-10-CM

## 2019-12-22 NOTE — Telephone Encounter (Signed)
We referred last year for Neurosurgery, Patient could not leave county due to covid restrictions I told her would re open the Neurosurgery referral, but I have not sent yet.   Have put in system we will send  Thanks

## 2019-12-22 NOTE — Telephone Encounter (Signed)
Call received from patient's social services contact/designated party contact on file, Foye Spurling, cell phone# [phone], or work phone # [phone] ext [phone] - responding to a call regarding a referral, however, the referral had been closed upon visit/appointment being completed 12/05/19 (it had not been assigned to this visit initially), and patient's next scheduled appointment is in 1 year per Dr Ruthe Mannan notes. Patient's contact [name] said "she is trying to get surgery done."  Chart notes indicate that Dr Aline Brochure recommends tertiary clinic for patient's surgery.  Please call [name] to review and advise.

## 2019-12-22 NOTE — Telephone Encounter (Signed)
She did not have MRI either as Dr Aline Brochure ordered last year I have put in system again and MRI will need to be done prior to the appointment being scheduled with Neurosurgery  I called [name] to advise. She voiced understanding  Put in MRI\  To you FYI

## 2019-12-22 NOTE — Addendum Note (Signed)
Addended byCandice Camp on: 12/22/2019 03:07 PM   Modules accepted: Orders

## 2019-12-23 ENCOUNTER — Telehealth: Payer: Self-pay | Admitting: Orthopedic Surgery

## 2019-12-23 NOTE — Telephone Encounter (Addendum)
[name] has new open unit I called left message for her to advise.

## 2019-12-23 NOTE — Telephone Encounter (Signed)
[name] [name] from [employer] called and left a message stating that [name] will have to have her MRI done at an open unit such as [facility].    Please call [name] at the office at  [phone] ext [phone] or on her cell phone [phone]

## 2019-12-31 ENCOUNTER — Ambulatory Visit (INDEPENDENT_AMBULATORY_CARE_PROVIDER_SITE_OTHER): Payer: Medicare Other | Admitting: Gastroenterology

## 2019-12-31 ENCOUNTER — Encounter: Payer: Self-pay | Admitting: Gastroenterology

## 2019-12-31 ENCOUNTER — Other Ambulatory Visit: Payer: Self-pay

## 2019-12-31 VITALS — Temp 97.6°F | Ht 69.0 in | Wt 228.2 lb

## 2019-12-31 DIAGNOSIS — K580 Irritable bowel syndrome with diarrhea: Secondary | ICD-10-CM | POA: Diagnosis not present

## 2019-12-31 DIAGNOSIS — R1310 Dysphagia, unspecified: Secondary | ICD-10-CM | POA: Diagnosis not present

## 2019-12-31 MED ORDER — DICYCLOMINE HCL 10 MG PO CAPS: ORAL_CAPSULE | ORAL | 5 refills | Status: DC

## 2019-12-31 NOTE — Patient Instructions (Addendum)
Please restart dicyclomine (Bentyl) up to three times a day before eating. Monitor for constipation, dry mouth, dizziness. If frequent stool continues or you have watery stool, please call.   Keep plans for endoscopy with dilation upcoming. Continue Aciphex twice a day.  I will see you in 3 months!  I enjoyed seeing you again today! As you know, I value our relationship and want to provide genuine, compassionate, and quality care. I welcome your feedback. If you receive a survey regarding your visit,  I greatly appreciate you taking time to fill this out. See you next time!  Annitta Needs, PhD, ANP-BC [facility]

## 2019-12-31 NOTE — Progress Notes (Signed)
Cc'ed to pcp °

## 2019-12-31 NOTE — Progress Notes (Signed)
Referring Provider: Kathyrn Drown, MD Primary Care Physician:  Kathyrn Drown, MD  Primary GI: Dr. Gala Romney   Chief Complaint  Patient presents with  . Rectal Pain  . Diarrhea    10 times per day    HPI:   [name] is a 59 y.o. female presenting today with a history of GERD, IBS,dysphagia,colonoscopy due in 2023 (history of adenomas). EGD with empiric dilation completed Oct 2020.Dysphagia recurrent, with BPE updated Jan 2021 with relative narrowing of GE junction, temporarily obstructed 12.5 mm tablet but passed following multiple swallows of water and thin barium. Esophageal dysmotility. Due to worsening solid food dysphagia and regurgitation, we arranged an early interval EGD with dilation that is upcoming July 2021.   She is here now to follow-up on rectal bleeding and pain that was reported in May. She was constipated in May 2021, so we stopped Bentyl.   Denies rectal bleeding and pain for a month. Stool is green. BM "ten times per day". "softy" not "watery". Having abdominal cramping. No recent antibiotics. No sick contacts. "Doesn't go anywhere". Bentyl worked well in past. Not taking now.   Has issues with potatoes, chicken getting hung. Aciphex BID controls GERD. Doesn't wear dentures routinely. Sitting back when eating in recliner.    Past Medical History:  Diagnosis Date  . Anxiety   . Diabetes mellitus   . Diabetes mellitus, type II ([place])   . HTN (hypertension)   . Hyperglycemia   . IBS (irritable bowel syndrome)   . Lung nodules    right, followed by PCP, PET 11/2011  . Mental retardation   . MI (myocardial infarction) ([place])   . Migraines   . Sleep apnea     Past Surgical History:  Procedure Laterality Date  . ABDOMINAL HYSTERECTOMY    . COLONOSCOPY  11/2007   hyperplastic polyps, prior hx of adenomas   . COLONOSCOPY  05/2010   incomplete due to poor prep, hyperplastic rectal polyp  . COLONOSCOPY  05/05/2002   Dimunitive polyps in the rectum  and left colon, cold    biopsied/removed.  Scattered few left-sided diverticula.  Regular colonic   mucosa appeared normal  . COLONOSCOPY N/A 05/28/2013   Rourk: mulitple tubular adenomas removed. next tcs 05/2016  . COLONOSCOPY WITH PROPOFOL N/A 09/07/2016   Dr. Gala Romney: For hyperplastic polyps removed. Next colonoscopy March 2023 given history of adenomatous colon polyps in the past.  . ESOPHAGOGASTRODUODENOSCOPY  08/2007   moderate sized hiatal hernia  . ESOPHAGOGASTRODUODENOSCOPY  05/2010   noncritical schatzki ring s/p 49F  . ESOPHAGOGASTRODUODENOSCOPY (EGD) WITH ESOPHAGEAL DILATION N/A 02/06/2013   KWI:OXBDZH esophagus-s/p dilation up to a 34 [place] size with [facility] dilators.  Hiatal hernia  . ESOPHAGOGASTRODUODENOSCOPY (EGD) WITH PROPOFOL N/A 09/07/2016   Dr. Gala Romney: Normal, status post empiric dilation of the esophagus for history of dysphagia  . ESOPHAGOGASTRODUODENOSCOPY (EGD) WITH PROPOFOL N/A 05/02/2019   Normal esophagus s/p dilation, normal stomach, normal duodenum  . EXTERNAL EAR SURGERY     bilateral  . FOOT SURGERY    . GLAUCOMA SURGERY    . MALONEY DILATION N/A 09/07/2016   Procedure: Venia Minks DILATION;  Surgeon: Daneil Dolin, MD;  [facility];  Service: Endoscopy;  Laterality: N/A;  . Venia Minks DILATION N/A 05/02/2019   Procedure: Venia Minks DILATION;  Surgeon: Daneil Dolin, MD;  [facility]: [facility];  Service: Endoscopy;  Laterality: N/A;  . POLYPECTOMY  09/07/2016   Procedure: POLYPECTOMY;  Surgeon: Daneil Dolin, MD;  Location: [facility];  Service: Endoscopy;;  sigmoid colon x4  . small bowel capsule  10/2007   normal    Current Outpatient Medications  Medication Sig Dispense Refill  . atorvastatin (LIPITOR) 10 MG tablet TAKE 1 TABLET BY MOUTH ONCE A DAY. 30 tablet 0  . benztropine (COGENTIN) 1 MG tablet Take 1 tablet (1 mg total) by mouth daily. 90 tablet 2  . hydrocortisone (ANUSOL-HC) 2.5 % rectal cream Place 1 application rectally 2 (two) times daily.  30 g 1  . lamoTRIgine (LAMICTAL) 100 MG tablet Take 1 tablet (100 mg total) by mouth 2 (two) times daily. 180 tablet 2  . lisinopril (ZESTRIL) 2.5 MG tablet TAKE (1) TABLET BY MOUTH EACH MORNING. 30 tablet 0  . paliperidone (INVEGA) 9 MG 24 hr tablet Take 1 tablet (9 mg total) by mouth daily. 90 tablet 2  . potassium chloride (KLOR-CON) 10 MEQ tablet Take 2 tablets po in the morning and one tablet po in the evening. 90 tablet 5  . propranolol (INDERAL) 10 MG tablet TAKE 1 TABLET BY MOUTH THREE TIMES A DAY. 90 tablet 0  . RABEprazole (ACIPHEX) 20 MG tablet Take 1 tablet (20 mg total) by mouth 2 (two) times daily before a meal. 60 tablet 11  . torsemide (DEMADEX) 20 MG tablet Take 1 tablet (20 mg total) by mouth daily. 30 tablet 5  . dicyclomine (BENTYL) 10 MG capsule May use one up to 3 times a day as needed for abdominal cramps 90 capsule 5   No current facility-administered medications for this visit.    Allergies as of 12/31/2019 - Review Complete 12/31/2019  Allergen Reaction Noted  . Thorazine [chlorpromazine hcl] Anaphylaxis 10/20/2010  . Acetaminophen Other (See Comments)   . Aspirin Other (See Comments)   . Aspirin-acetaminophen-caffeine Other (See Comments)   . Nsaids Nausea And Vomiting   . Other  03/22/2019  . Penicillins Nausea And Vomiting   . Tomato Rash 01/17/2012    Family History  Problem Relation Age of Onset  . Stroke Mother   . Heart attack Father   . Schizophrenia Other   . Drug abuse Other   . Alcohol abuse Other   . Colon cancer Other        aunt  . Obesity Other   . COPD Other   . GER disease Other   . Diabetes type II Other   . Anxiety disorder Other   . Depression Other   . Depression Sister   . Schizophrenia Sister   . Liver disease Neg Hx   . Inflammatory bowel disease Neg Hx     Social History   Socioeconomic History  . Marital status: Divorced    Spouse name: Not on file  . Number of children: Not on file  . Years of education: Not on  file  . Highest education level: High school graduate  Occupational History  . Occupation: disabled    Fish farm manager: UNEMPLOYED  Tobacco Use  . Smoking status: Current Every Day Smoker    Packs/day: 0.25    Years: 40.00    Pack years: 10.00    Types: Cigarettes  . Smokeless tobacco: Never Used  Vaping Use  . Vaping Use: Never used  Substance and Sexual Activity  . Alcohol use: No  . Drug use: No  . Sexual activity: Yes    Partners: Male    Birth control/protection: None    Comment: boyfriend  Other Topics Concern  .  Not on file  Social History Narrative  . Not on file   Social Determinants of Health   Financial Resource Strain: Low Risk   . Difficulty of Paying Living Expenses: Not hard at all  Food Insecurity: No Food Insecurity  . Worried About Charity fundraiser in the Last Year: Never true  . Ran Out of Food in the Last Year: Never true  Transportation Bryant: No Transportation Bryant  . Lack of Transportation (Medical): No  . Lack of Transportation (Non-Medical): No  Physical Activity: Insufficiently Active  . Days of Exercise per Week: 7 days  . Minutes of Exercise per Session: 20 min  Stress: No Stress Concern Present  . Feeling of Stress : Only a little  Social Connections: Unknown  . Frequency of Communication with Friends and Family: Not asked  . Frequency of Social Gatherings with Friends and Family: Not on file  . Attends Religious Services: Not on file  . Active Member of Clubs or Organizations: Not on file  . Attends Archivist Meetings: Not on file  . Marital Status: Not on file    Review of Systems: Gen: Denies fever, chills, anorexia. Denies fatigue, weakness, weight loss.  CV: Denies chest pain, palpitations, syncope, peripheral edema, and claudication. Resp: Denies dyspnea at rest, cough, wheezing, coughing up blood, and pleurisy. GI: see HPI Derm: Denies rash, itching, dry skin Psych: Denies depression, anxiety, memory loss, confusion.  No homicidal or suicidal ideation.  Heme: Denies bruising, bleeding, and enlarged lymph nodes.  Physical Exam: Temp 97.6 F (36.4 C)   Ht 5\' 9"  (1.753 m)   Wt 228 lb 3.2 oz (103.5 kg)   BMI 33.70 kg/m  General:   Alert and oriented. No distress noted. Pleasant and cooperative.  Head:  Normocephalic and atraumatic. Eyes:  Conjuctiva clear without scleral icterus. Mouth:  Edentulous Lungs: clear bilaterally Cardiac: S1 S2 present without murmurs Abdomen:  +BS, soft, non-tender and non-distended. No rebound or guarding. No HSM or masses noted. Rectal: no hemorrhoids, no prolapsing hemorrhoids visible. No obvious fissure. DRE without mass or pain.  Msk:  Symmetrical without gross deformities. Normal posture. Extremities:  Without edema. Neurologic:  Alert and  oriented x4 Psych:  Alert and cooperative. Normal mood and affect.  ASSESSMENT: LLUVIA GWYNNE is a 59 y.o. female presenting today with history of chronic GERD, IBS, dysphagia, with reports of rectal pain and bleeding that are now resolved. Currently dealing with frequent stools, increased from baseline.   GERD: controlled with Aciphex BID. Continue  Dysphagia:  empiric dilation Oct 2020. BPE thereafter with relative narrowing of GE junction, temporarily obstructing tablet but passing after multiple swallows. Dysmotility noted. She notes improvement with dilation in past. Will pursue early interval EGD/dilation with her significant symptoms and may need to refer for manometry thereafter. Edentulous status playing a role. Keep plans for EGD with dilation already on books.  IBS: resume Bentyl. Appears to have more soft stool than diarrhea. Call if persistent despite resuming Bentyl.  Rectal pain and bleeding: resolved now with physical exam benign. Last colonoscopy 2018. Likely hemorrhoid, ?fissure related in setting of constipation. She has no symptoms now after course of Anusol. Clinically follow for now with close follow-up.     PLAN:   Keep plans for upper endoscopy/dilation in the near future with Dr. Gala Romney with Propofol. The risks, benefits, and alternatives have been discussed in detail with patient. They have stated understanding and desire to proceed.   Anticipate possible manometry thereafter  if persistent dysphagia. Discussed with case work edentulous status, and she will be obtaining dentures for patient in near future.  Sit upright while eating  Resume dicyclomine, call if further frequent stool  Close follow-up in 3 months  Denise Needs, PhD, [facility]

## 2020-01-07 ENCOUNTER — Ambulatory Visit (HOSPITAL_COMMUNITY)
Admission: RE | Admit: 2020-01-07 | Discharge: 2020-01-07 | Disposition: A | Discharge status: Home / Self Care | Payer: Medicare Other | Source: Ambulatory Visit | Attending: Orthopedic Surgery | Admitting: Orthopedic Surgery

## 2020-01-07 ENCOUNTER — Other Ambulatory Visit: Payer: Self-pay

## 2020-01-07 DIAGNOSIS — M48061 Spinal stenosis, lumbar region without neurogenic claudication: Secondary | ICD-10-CM | POA: Insufficient documentation

## 2020-01-07 DIAGNOSIS — M545 Low back pain: Secondary | ICD-10-CM | POA: Diagnosis not present

## 2020-01-08 NOTE — Patient Instructions (Addendum)
49    Your procedure is scheduled on: 01/15/2020  Report to [facility] at   11:00  AM.  Call this number if you have problems the morning of surgery: [phone]   Remember:   Follow the instructions from the office regarding when to stop eating and drinking clear liquids        No Smoking the day of procedure      Take these medicines the morning of surgery with A SIP OF WATER: Lamictal, Invega, Inderal, Aciphex, and zoloft   Do not wear jewelry, make-up or nail polish.  Do not wear lotions, powders, or perfumes. You may wear deodorant.                Do not bring valuables to the hospital.  Contacts, dentures or bridgework may not be worn into surgery.  Leave suitcase in the car. After surgery it may be brought to your room.  For patients admitted to the hospital, checkout time is 11:00 AM the day of discharge.   Patients discharged the day of surgery will not be allowed to drive home. Upper Endoscopy, Adult Upper endoscopy is a procedure to look inside the upper GI (gastrointestinal) tract. The upper GI tract is made up of:  The part of the body that moves food from your mouth to your stomach (esophagus).  The stomach.  The first part of your small intestine (duodenum). This procedure is also called esophagogastroduodenoscopy (EGD) or gastroscopy. In this procedure, your health care provider passes a thin, flexible tube (endoscope) through your mouth and down your esophagus into your stomach. A small camera is attached to the end of the tube. Images from the camera appear on a monitor in the exam room. During this procedure, your health care provider may also remove a small piece of tissue to be sent to a lab and examined under a microscope (biopsy). Your health care provider may do an upper endoscopy to diagnose cancers of the upper GI tract. You may also have this procedure to find the cause of other conditions, such as:  Stomach pain.  Heartburn.  Pain or problems when  swallowing.  Nausea and vomiting.  Stomach bleeding.  Stomach ulcers. Tell a health care provider about:  Any allergies you have.  All medicines you are taking, including vitamins, herbs, eye drops, creams, and over-the-counter medicines.  Any problems you or family members have had with anesthetic medicines.  Any blood disorders you have.  Any surgeries you have had.  Any medical conditions you have.  Whether you are pregnant or may be pregnant. What are the risks? Generally, this is a safe procedure. However, problems may occur, including:  Infection.  Bleeding.  Allergic reactions to medicines.  A tear or hole (perforation) in the esophagus, stomach, or duodenum. What happens before the procedure? Staying hydrated Follow instructions from your health care provider about hydration, which may include:  Up to 4 hours before the procedure - you may continue to drink clear liquids, such as water, clear fruit juice, black coffee, and plain tea.    Medicines Ask your health care provider about:  Changing or stopping your regular medicines. This is especially important if you are taking diabetes medicines or blood thinners.  Taking medicines such as aspirin and ibuprofen. These medicines can thin your blood. Do not take these medicines unless your health care provider tells you to take them.  Taking over-the-counter medicines, vitamins, herbs, and supplements. General instructions  Plan to have someone  take you home from the hospital or clinic.  If you will be going home right after the procedure, plan to have someone with you for 24 hours.  Ask your health care provider what steps will be taken to help prevent infection. What happens during the procedure?  1. An IV will be inserted into one of your veins. 2. You may be given one or more of the following: ? A medicine to help you relax (sedative). ? A medicine to numb the throat (local anesthetic). 3. You will  lie on your left side on an exam table. 4. Your health care provider will pass the endoscope through your mouth and down your esophagus. 5. Your health care provider will use the scope to check the inside of your esophagus, stomach, and duodenum. Biopsies may be taken. 6. The endoscope will be removed. The procedure may vary among health care providers and hospitals. What happens after the procedure?  Your blood pressure, heart rate, breathing rate, and blood oxygen level will be monitored until you leave the hospital or clinic.  Do not drive for 24 hours if you were given a sedative during your procedure.  When your throat is no longer numb, you may be given some fluids to drink.  It is up to you to get the results of your procedure. Ask your health care provider, or the department that is doing the procedure, when your results will be ready. Summary  Upper endoscopy is a procedure to look inside the upper GI tract.  During the procedure, an IV will be inserted into one of your veins. You may be given a medicine to help you relax.  A medicine will be used to numb your throat.  The endoscope will be passed through your mouth and down your esophagus. This information is not intended to replace advice given to you by your health care provider. Make sure you discuss any questions you have with your health care provider. Document Revised: 12/19/2017 Document Reviewed: 11/26/2017 Elsevier Patient Education  [place] After  Please read the instructions outlined below and refer to this sheet in the next few weeks. These discharge instructions provide you with general information on caring for yourself after you leave the hospital. Your doctor may also give you specific instructions. While your treatment has been planned according to the  most current medical practices available, unavoidable complications occasionally occur. If you have any problems or questions after discharge, please call your doctor. HOME CARE INSTRUCTIONS Activity  You may resume your regular activity but move at a slower pace for the next 24 hours.   Take frequent rest periods for the next 24 hours.   Walking will help expel (get rid  of) the air and reduce the bloated feeling in your abdomen.   No driving for 24 hours (because of the anesthesia (medicine) used during the test).   You may shower.   Do not sign any important legal documents or operate any machinery for 24 hours (because of the anesthesia used during the test).  Nutrition  Drink plenty of fluids.   You may resume your normal diet.   Begin with a light meal and progress to your normal diet.   Avoid alcoholic beverages for 24 hours or as instructed by your caregiver.  Medications You may resume your normal medications unless your caregiver tells you otherwise. What you can expect today  You may experience abdominal discomfort such as a feeling of fullness or "gas" pains.   You may experience a sore throat for 2 to 3 days. This is normal. Gargling with salt water may help this.  Follow-up Your doctor will discuss the results of your test with you. SEEK IMMEDIATE MEDICAL CARE IF:  You have excessive nausea (feeling sick to your stomach) and/or vomiting.   You have severe abdominal pain and distention (swelling).   You have trouble swallowing.   You have a temperature over 100 F (37.8 C).   You have rectal bleeding or vomiting of blood.  Document Released: 02/08/2004 Document Revised: 06/15/2011 Document Reviewed: 08/21/2007

## 2020-01-13 ENCOUNTER — Other Ambulatory Visit: Payer: Self-pay

## 2020-01-13 ENCOUNTER — Encounter (HOSPITAL_COMMUNITY)
Admission: RE | Admit: 2020-01-13 | Discharge: 2020-01-13 | Disposition: A | Discharge status: Home / Self Care | Payer: Medicare Other | Source: Ambulatory Visit | Attending: Internal Medicine | Admitting: Internal Medicine

## 2020-01-13 ENCOUNTER — Other Ambulatory Visit (HOSPITAL_COMMUNITY)
Admission: RE | Admit: 2020-01-13 | Discharge: 2020-01-13 | Disposition: A | Discharge status: Home / Self Care | Payer: Medicare Other | Source: Ambulatory Visit | Attending: Internal Medicine | Admitting: Internal Medicine

## 2020-01-13 ENCOUNTER — Encounter (HOSPITAL_COMMUNITY): Payer: Self-pay

## 2020-01-13 DIAGNOSIS — Z20822 Contact with and (suspected) exposure to covid-19: Secondary | ICD-10-CM | POA: Diagnosis not present

## 2020-01-13 DIAGNOSIS — Z01812 Encounter for preprocedural laboratory examination: Secondary | ICD-10-CM | POA: Insufficient documentation

## 2020-01-13 LAB — BASIC METABOLIC PANEL
Anion gap: 12 (ref 5–15)
BUN: 5 mg/dL — ABNORMAL LOW (ref 6–20)
CO2: 33 mmol/L — ABNORMAL HIGH (ref 22–32)
Calcium: 9 mg/dL (ref 8.9–10.3)
Chloride: 96 mmol/L — ABNORMAL LOW (ref 98–111)
Creatinine, Ser: 0.8 mg/dL (ref 0.44–1.00)
GFR calc Af Amer: >60 mL/min (ref >60)
GFR calc non Af Amer: >60 mL/min (ref >60)
Glucose, Bld: 97 mg/dL (ref 70–99)
Potassium: 3.3 mmol/L — ABNORMAL LOW (ref 3.5–5.1)
Sodium: 141 mmol/L (ref 135–145)

## 2020-01-13 LAB — SARS CORONAVIRUS 2 (TAT 6-24 HRS): SARS Coronavirus 2: NEGATIVE

## 2020-01-14 ENCOUNTER — Telehealth: Payer: Self-pay | Admitting: Orthopedic Surgery

## 2020-01-14 ENCOUNTER — Telehealth: Payer: Self-pay | Admitting: Radiology

## 2020-01-14 DIAGNOSIS — M170 Bilateral primary osteoarthritis of knee: Secondary | ICD-10-CM

## 2020-01-14 NOTE — Telephone Encounter (Signed)
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  I will get her a consultation with a orthopedist at a tertiary care facility or [facility] to see if they would be willing to take this on.  My concerns are #1 she still smokes  2.  She is at high risk for fall preoperatively  3.  Her mental status is such that she may not participate in rehab properly  4.  She lives alone and may have some difficulties in the home setting and be a high fall risk.

## 2020-01-14 NOTE — Telephone Encounter (Signed)
Called [name] to advise her of the number to call for appointment I have faxed records

## 2020-01-14 NOTE — Telephone Encounter (Signed)
-----   Message from Carole Civil, MD sent at 01/14/2020  2:42 PM EDT ----- [facility] consultation for total knee replacement

## 2020-01-15 ENCOUNTER — Ambulatory Visit (HOSPITAL_COMMUNITY): Payer: Medicare Other | Admitting: Anesthesiology

## 2020-01-15 ENCOUNTER — Ambulatory Visit (HOSPITAL_COMMUNITY)
Admission: RE | Admit: 2020-01-15 | Discharge: 2020-01-15 | Disposition: A | Discharge status: Home / Self Care | Payer: Medicare Other | Attending: Internal Medicine | Admitting: Internal Medicine

## 2020-01-15 ENCOUNTER — Other Ambulatory Visit: Payer: Self-pay | Admitting: Family Medicine

## 2020-01-15 ENCOUNTER — Other Ambulatory Visit: Payer: Self-pay

## 2020-01-15 ENCOUNTER — Encounter (HOSPITAL_COMMUNITY)
Admission: RE | Disposition: A | Discharge status: Home / Self Care | Payer: Self-pay | Source: Home / Self Care | Attending: Internal Medicine

## 2020-01-15 ENCOUNTER — Encounter (HOSPITAL_COMMUNITY): Payer: Self-pay | Admitting: Internal Medicine

## 2020-01-15 DIAGNOSIS — F17210 Nicotine dependence, cigarettes, uncomplicated: Secondary | ICD-10-CM | POA: Insufficient documentation

## 2020-01-15 DIAGNOSIS — R296 Repeated falls: Secondary | ICD-10-CM | POA: Diagnosis not present

## 2020-01-15 DIAGNOSIS — F79 Unspecified intellectual disabilities: Secondary | ICD-10-CM | POA: Insufficient documentation

## 2020-01-15 DIAGNOSIS — K219 Gastro-esophageal reflux disease without esophagitis: Secondary | ICD-10-CM | POA: Insufficient documentation

## 2020-01-15 DIAGNOSIS — Z888 Allergy status to other drugs, medicaments and biological substances status: Secondary | ICD-10-CM | POA: Diagnosis not present

## 2020-01-15 DIAGNOSIS — Z79899 Other long term (current) drug therapy: Secondary | ICD-10-CM | POA: Insufficient documentation

## 2020-01-15 DIAGNOSIS — Z886 Allergy status to analgesic agent status: Secondary | ICD-10-CM | POA: Insufficient documentation

## 2020-01-15 DIAGNOSIS — J449 Chronic obstructive pulmonary disease, unspecified: Secondary | ICD-10-CM | POA: Insufficient documentation

## 2020-01-15 DIAGNOSIS — Z880 Allergy status to penicillin: Secondary | ICD-10-CM | POA: Insufficient documentation

## 2020-01-15 DIAGNOSIS — I10 Essential (primary) hypertension: Secondary | ICD-10-CM | POA: Insufficient documentation

## 2020-01-15 DIAGNOSIS — I252 Old myocardial infarction: Secondary | ICD-10-CM | POA: Diagnosis not present

## 2020-01-15 DIAGNOSIS — G4730 Sleep apnea, unspecified: Secondary | ICD-10-CM | POA: Insufficient documentation

## 2020-01-15 DIAGNOSIS — K319 Disease of stomach and duodenum, unspecified: Secondary | ICD-10-CM | POA: Insufficient documentation

## 2020-01-15 DIAGNOSIS — B9681 Helicobacter pylori [H. pylori] as the cause of diseases classified elsewhere: Secondary | ICD-10-CM | POA: Diagnosis not present

## 2020-01-15 DIAGNOSIS — R1310 Dysphagia, unspecified: Secondary | ICD-10-CM | POA: Diagnosis not present

## 2020-01-15 DIAGNOSIS — F419 Anxiety disorder, unspecified: Secondary | ICD-10-CM | POA: Diagnosis not present

## 2020-01-15 DIAGNOSIS — M1990 Unspecified osteoarthritis, unspecified site: Secondary | ICD-10-CM | POA: Diagnosis not present

## 2020-01-15 DIAGNOSIS — E119 Type 2 diabetes mellitus without complications: Secondary | ICD-10-CM | POA: Diagnosis not present

## 2020-01-15 DIAGNOSIS — K3189 Other diseases of stomach and duodenum: Secondary | ICD-10-CM | POA: Diagnosis not present

## 2020-01-15 HISTORY — PX: MALONEY DILATION: SHX5535

## 2020-01-15 HISTORY — PX: ESOPHAGOGASTRODUODENOSCOPY (EGD) WITH PROPOFOL: SHX5813

## 2020-01-15 HISTORY — PX: BIOPSY: SHX5522

## 2020-01-15 LAB — GLUCOSE, CAPILLARY: Glucose-Capillary: 94 mg/dL (ref 70–99)

## 2020-01-15 SURGERY — ESOPHAGOGASTRODUODENOSCOPY (EGD) WITH PROPOFOL
Anesthesia: General

## 2020-01-15 MED ORDER — GLYCOPYRROLATE 0.2 MG/ML IJ SOLN
0.2000 mg | Freq: Once | INTRAMUSCULAR | Status: AC
  Administered 2020-01-15: .2 mg via INTRAVENOUS

## 2020-01-15 MED ORDER — LACTATED RINGERS IV SOLN
INTRAVENOUS | Status: DC | PRN
Start: 2020-01-15 — End: 2020-01-15

## 2020-01-15 MED ORDER — IPRATROPIUM-ALBUTEROL 0.5-2.5 (3) MG/3ML IN SOLN
3.0000 mL | Freq: Once | RESPIRATORY_TRACT | Status: AC
  Administered 2020-01-15: 3 mL via RESPIRATORY_TRACT

## 2020-01-15 MED ORDER — IPRATROPIUM-ALBUTEROL 0.5-2.5 (3) MG/3ML IN SOLN
RESPIRATORY_TRACT | Status: AC
  Filled 2020-01-15: qty 3

## 2020-01-15 MED ORDER — LIDOCAINE VISCOUS HCL 2 % MT SOLN
OROMUCOSAL | Status: AC
  Filled 2020-01-15: qty 15

## 2020-01-15 MED ORDER — PROPOFOL 500 MG/50ML IV EMUL
INTRAVENOUS | Status: DC | PRN
  Administered 2020-01-15: 175 ug/kg/min via INTRAVENOUS
  Administered 2020-01-15: 100 mg via INTRAVENOUS

## 2020-01-15 MED ORDER — LIDOCAINE VISCOUS HCL 2 % MT SOLN
15.0000 mL | Freq: Once | OROMUCOSAL | Status: AC
  Administered 2020-01-15: 7.5 mL via OROMUCOSAL

## 2020-01-15 MED ORDER — CHLORHEXIDINE GLUCONATE CLOTH 2 % EX PADS: 6.0000 | MEDICATED_PAD | Freq: Once | CUTANEOUS | Status: DC

## 2020-01-15 MED ORDER — LACTATED RINGERS IV SOLN: Freq: Once | INTRAVENOUS | Status: AC

## 2020-01-15 MED ORDER — LIDOCAINE HCL (CARDIAC) PF 50 MG/5ML IV SOSY
PREFILLED_SYRINGE | INTRAVENOUS | Status: DC | PRN
  Administered 2020-01-15: 100 mg via INTRAVENOUS

## 2020-01-15 NOTE — Transfer of Care (Signed)
Immediate Anesthesia Transfer of Care Note  Patient: [name]  Procedure(s) Performed: ESOPHAGOGASTRODUODENOSCOPY (EGD) WITH PROPOFOL (N/A ) MALONEY DILATION (N/A ) BIOPSY  Patient Location: PACU  Anesthesia Type:General  Level of Consciousness: awake, alert , oriented and patient cooperative  Airway & Oxygen Therapy: Patient Spontanous Breathing and Patient connected to nasal cannula oxygen  Post-op Assessment: Report given to RN, Post -op Vital signs reviewed and stable and Patient moving all extremities  Post vital signs: Reviewed and stable  Last Vitals:  Vitals Value Taken Time  BP    Temp    Pulse 74 01/15/20 1224  Resp    SpO2 96 % 01/15/20 1224  Vitals shown include unvalidated device data.  Last Pain:  Vitals:   01/15/20 1207  TempSrc:   PainSc: 0-No pain      Patients Stated Pain Goal: 5 (63/87/56 4332)  Complications: No complications documented.

## 2020-01-15 NOTE — H&P (Signed)
@LOGO @   Primary Care Physician:  Kathyrn Drown, MD Primary Gastroenterologist:  Dr. Gala Romney  Pre-Procedure History & Physical: HPI:  [name] is a 59 y.o. female here for further evaluation of dysphagia via EGD.  History of abnormal barium esophagram demonstrating narrowing of the GE junction.  Reflux symptoms treated with AcipHex 20 mg twice daily.  Past Medical History:  Diagnosis Date  . Anxiety   . Diabetes mellitus   . Diabetes mellitus, type II ([place])   . HTN (hypertension)   . Hyperglycemia   . IBS (irritable bowel syndrome)   . Lung nodules    right, followed by PCP, PET 11/2011  . Mental retardation   . MI (myocardial infarction) ([place])   . Migraines   . Sleep apnea     Past Surgical History:  Procedure Laterality Date  . ABDOMINAL HYSTERECTOMY    . COLONOSCOPY  11/2007   hyperplastic polyps, prior hx of adenomas   . COLONOSCOPY  05/2010   incomplete due to poor prep, hyperplastic rectal polyp  . COLONOSCOPY  05/05/2002   Dimunitive polyps in the rectum and left colon, cold    biopsied/removed.  Scattered few left-sided diverticula.  Regular colonic   mucosa appeared normal  . COLONOSCOPY N/A 05/28/2013   Matsue Strom: mulitple tubular adenomas removed. next tcs 05/2016  . COLONOSCOPY WITH PROPOFOL N/A 09/07/2016   Dr. Gala Romney: For hyperplastic polyps removed. Next colonoscopy March 2023 given history of adenomatous colon polyps in the past.  . ESOPHAGOGASTRODUODENOSCOPY  08/2007   moderate sized hiatal hernia  . ESOPHAGOGASTRODUODENOSCOPY  05/2010   noncritical schatzki ring s/p 9F  . ESOPHAGOGASTRODUODENOSCOPY (EGD) WITH ESOPHAGEAL DILATION N/A 02/06/2013   CHE:NIDPOE esophagus-s/p dilation up to a 30 [place] size with [facility] dilators.  Hiatal hernia  . ESOPHAGOGASTRODUODENOSCOPY (EGD) WITH PROPOFOL N/A 09/07/2016   Dr. Gala Romney: Normal, status post empiric dilation of the esophagus for history of dysphagia  . ESOPHAGOGASTRODUODENOSCOPY (EGD) WITH PROPOFOL N/A 05/02/2019    Normal esophagus s/p dilation, normal stomach, normal duodenum  . EXTERNAL EAR SURGERY     bilateral  . FOOT SURGERY    . GLAUCOMA SURGERY    . MALONEY DILATION N/A 09/07/2016   Procedure: Venia Minks DILATION;  Surgeon: Daneil Dolin, MD;  [facility];  Service: Endoscopy;  Laterality: N/A;  . Venia Minks DILATION N/A 05/02/2019   Procedure: Venia Minks DILATION;  Surgeon: Daneil Dolin, MD;  Location: [facility];  Service: Endoscopy;  Laterality: N/A;  . POLYPECTOMY  09/07/2016   Procedure: POLYPECTOMY;  Surgeon: Daneil Dolin, MD;  Location: [facility];  Service: Endoscopy;;  sigmoid colon x4  . small bowel capsule  10/2007   normal    Prior to Admission medications   Medication Sig Start Date End Date Taking? Authorizing Provider  atorvastatin (LIPITOR) 10 MG tablet TAKE 1 TABLET BY MOUTH ONCE A DAY. 10/21/19  Yes Luking, Elayne Snare, MD  benztropine (COGENTIN) 1 MG tablet Take 1 tablet (1 mg total) by mouth daily. 08/26/19  Yes Cloria Spring, MD  dicyclomine (BENTYL) 10 MG capsule May use one up to 3 times a day as needed for abdominal cramps Patient taking differently: Take 10 mg by mouth 3 (three) times daily as needed (abdominal cramps.).  12/31/19  Yes Annitta Needs, NP  lamoTRIgine (LAMICTAL) 100 MG tablet Take 1 tablet (100 mg total) by mouth 2 (two) times daily. 08/26/19  Yes Cloria Spring, MD  lisinopril (ZESTRIL) 2.5 MG tablet TAKE (  1) TABLET BY MOUTH EACH MORNING. 12/10/19  Yes Luking, Elayne Snare, MD  paliperidone (INVEGA) 9 MG 24 hr tablet Take 1 tablet (9 mg total) by mouth daily. 08/26/19  Yes Cloria Spring, MD  polyethylene glycol (MIRALAX / GLYCOLAX) 17 g packet Take 17 g by mouth daily as needed (mild constipation.).   Yes [provider]  potassium chloride (KLOR-CON) 10 MEQ tablet Take 2 tablets po in the morning and one tablet po in the evening. Patient taking differently: Take 10-20 mEq by mouth See admin instructions. Take 2 tablets (20 meq) by mouth in the  morning & take 1 tablet (10 meq) by mouth in the evening. 10/06/19  Yes Kathyrn Drown, MD  propranolol (INDERAL) 10 MG tablet TAKE 1 TABLET BY MOUTH THREE TIMES A DAY. 12/10/19  Yes Luking, Elayne Snare, MD  RABEprazole (ACIPHEX) 20 MG tablet Take 1 tablet (20 mg total) by mouth 2 (two) times daily before a meal. 11/18/19  Yes Annitta Needs, NP  sertraline (ZOLOFT) 100 MG tablet Take 100 mg by mouth daily.   Yes [provider]  torsemide (DEMADEX) 20 MG tablet Take 1 tablet (20 mg total) by mouth daily. 06/20/19  Yes Kathyrn Drown, MD  traZODone (DESYREL) 100 MG tablet Take 200 mg by mouth at bedtime.   Yes [provider]  hydrocortisone (ANUSOL-HC) 2.5 % rectal cream Place 1 application rectally 2 (two) times daily. Patient not taking: Reported on 12/31/2019 11/18/19   Annitta Needs, NP    Allergies as of 11/19/2019 - Review Complete 11/18/2019  Allergen Reaction Noted  . Thorazine [chlorpromazine hcl] Anaphylaxis 10/20/2010  . Acetaminophen Other (See Comments)   . Aspirin Other (See Comments)   . Aspirin-acetaminophen-caffeine Other (See Comments)   . Nsaids Nausea And Vomiting   . Other  03/22/2019  . Penicillins Nausea And Vomiting   . Tomato Rash 01/17/2012    Family History  Problem Relation Age of Onset  . Stroke Mother   . Heart attack Father   . Schizophrenia Other   . Drug abuse Other   . Alcohol abuse Other   . Colon cancer Other        aunt  . Obesity Other   . COPD Other   . GER disease Other   . Diabetes type II Other   . Anxiety disorder Other   . Depression Other   . Depression Sister   . Schizophrenia Sister   . Liver disease Neg Hx   . Inflammatory bowel disease Neg Hx     Social History   Socioeconomic History  . Marital status: Divorced    Spouse name: Not on file  . Number of children: Not on file  . Years of education: Not on file  . Highest education level: High school graduate  Occupational History  . Occupation: disabled     Fish farm manager: UNEMPLOYED  Tobacco Use  . Smoking status: Current Every Day Smoker    Packs/day: 0.25    Years: 40.00    Pack years: 10.00    Types: Cigarettes  . Smokeless tobacco: Never Used  Vaping Use  . Vaping Use: Never used  Substance and Sexual Activity  . Alcohol use: No  . Drug use: No  . Sexual activity: Yes    Partners: Male    Birth control/protection: None    Comment: boyfriend  Other Topics Concern  . Not on file  Social History Narrative  . Not on file  Social Determinants of Health   Financial Resource Strain: Low Risk   . Difficulty of Paying Living Expenses: Not hard at all  Food Insecurity: No Food Insecurity  . Worried About Charity fundraiser in the Last Year: Never true  . Ran Out of Food in the Last Year: Never true  Transportation Needs: No Transportation Needs  . Lack of Transportation (Medical): No  . Lack of Transportation (Non-Medical): No  Physical Activity: Insufficiently Active  . Days of Exercise per Week: 7 days  . Minutes of Exercise per Session: 20 min  Stress: No Stress Concern Present  . Feeling of Stress : Only a little  Social Connections: Unknown  . Frequency of Communication with Friends and Family: Not asked  . Frequency of Social Gatherings with Friends and Family: Not on file  . Attends Religious Services: Not on file  . Active Member of Clubs or Organizations: Not on file  . Attends Archivist Meetings: Not on file  . Marital Status: Not on file  Intimate Partner Violence: Not At Risk  . Fear of Current or Ex-Partner: No  . Emotionally Abused: No  . Physically Abused: No  . Sexually Abused: No    Review of Systems: See HPI, otherwise negative ROS  Physical Exam: BP (!) 112/47   Pulse 80   Temp 98.8 F (37.1 C) (Oral)   Resp 20   SpO2 92%  General:   Alert,  Well-developed, well-nourished, pleasant and cooperative in NAD Neck:  Supple; no masses or thyromegaly. No significant cervical adenopathy. Lungs:   Clear throughout to auscultation.   No wheezes, crackles, or rhonchi. No acute distress. Heart:  Regular rate and rhythm; no murmurs, clicks, rubs,  or gallops. Abdomen: Non-distended, normal bowel sounds.  Soft and nontender without appreciable mass or hepatosplenomegaly.  Pulses:  Normal pulses noted. Extremities:  Without clubbing or edema.  Impression/Plan: Pleasant 59 year old lady with esophageal dysphagia.  GERD well-controlled on twice daily AcipHex.  Here for EGD with esophageal dilation as feasible/appropriate per plan. The risks, benefits, limitations, alternatives and imponderables have been reviewed with the patient. Potential for esophageal dilation, biopsy, etc. have also been reviewed.  Questions have been answered. All parties agreeable.     Notice: This dictation was prepared with Dragon dictation along with smaller phrase technology. Any transcriptional errors that result from this process are unintentional and may not be corrected upon review.

## 2020-01-15 NOTE — Op Note (Signed)
[facility] Patient Name: [name] Procedure Date: 01/15/2020 10:41 AM MRN: [id number] Date of Birth: May 18, 1961 Attending MD: [name] , MD CSN: [id number] Age: 59 Admit Type: Outpatient Procedure:                Upper GI endoscopy Indications:              Dysphagia Providers:                Denise Richards, MD, [name]. Denise Seller, RN,                            [name] Referring MD:              Medicines:                Propofol per Anesthesia Complications:            No immediate complications. Estimated Blood Loss:     Estimated blood loss was minimal. Procedure:                Pre-Anesthesia Assessment:                           - Prior to the procedure, a History and Physical                            was performed, and patient medications and                            allergies were reviewed. The patient's tolerance of                            previous anesthesia was also reviewed. The risks                            and benefits of the procedure and the sedation                            options and risks were discussed with the patient.                            All questions were answered, and informed consent                            was obtained. Prior Anticoagulants: The patient has                            taken no previous anticoagulant or antiplatelet                            agents. ASA Grade Assessment: II - A patient with                            mild systemic disease. After reviewing the risks  and benefits, the patient was deemed in                            satisfactory condition to undergo the procedure.                           After obtaining informed consent, the endoscope was                            passed under direct vision. Throughout the                            procedure, the patient's blood pressure, pulse, and                            oxygen saturations were monitored  continuously. The                            GIF-H190 ([id number]) scope was introduced through the                            mouth, and advanced to the second part of duodenum.                            The upper GI endoscopy was accomplished without                            difficulty. The patient tolerated the procedure                            well. Scope In: 12:10:08 PM Scope Out: 12:19:36 PM Total Procedure Duration: 0 hours 9 minutes 28 seconds  Findings:      The examined esophagus was normal. 1 cm nodule distal greater curvature       with a submucosal component. Please see photos. Remainder the gastric       mucosa appeared normal. Patent pylorus. Normal-appearing D1 and D2 The       scope was withdrawn. Dilation was performed with a Maloney dilator with       no resistance at 56 Fr. The scope was withdrawn. Dilation was performed       with a Maloney dilator with no resistance at 47 Fr. The scope was       withdrawn. Dilation was performed with a Maloney dilator with no       resistance at 60 Fr. The dilation site was examined following endoscope       reinsertion and showed no change. Estimated blood loss: none.      Finally, the gastric nodule was biopsied for histologic study Impression:               - Normal esophagus. Dilated.                           Gastric nodule - biopsied Moderate Sedation:      Moderate (conscious) sedation was personally administered by an       anesthesia professional. The following parameters were monitored: oxygen  saturation, heart rate, blood pressure, respiratory rate, EKG, adequacy       of pulmonary ventilation, and response to care. Recommendation:           - Patient has a contact number available for                            emergencies. The signs and symptoms of potential                            delayed complications were discussed with the                            patient. Return to normal activities tomorrow.                             Written discharge instructions were provided to the                            patient.                           - Advance diet as tolerated. Continue AcipHex 20 mg                            twice daily. Office visit in 3 months. Follow-up on                            path. If patient develops recurrent esophageal                            dysphagia, would consider pursuing an esophageal                            manometry. Procedure Code(s):        --- Professional ---                           [phone], Esophagogastroduodenoscopy, flexible,                            transoral; diagnostic, including collection of                            specimen(s) by brushing or washing, when performed                            (separate procedure)                           43450, Dilation of esophagus, by unguided sound or                            bougie, single or multiple passes Diagnosis Code(s):        --- Professional ---  R13.10, Dysphagia, unspecified CPT copyright 2019 American Medical Association. All rights reserved. The codes documented in this report are preliminary and upon coder review may  be revised to meet current compliance requirements. [name]. Denise Stobaugh, MD Denise Richards, MD 01/15/2020 1:01:07 PM This report has been signed electronically. Number of Addenda: 0

## 2020-01-15 NOTE — Anesthesia Preprocedure Evaluation (Signed)
Anesthesia Evaluation  Patient identified by MRN, date of birth, ID band Patient awake    Reviewed: Allergy & Precautions, NPO status , Patient's Chart, lab work & pertinent test results, reviewed documented beta blocker date and time   History of Anesthesia Complications Negative for: history of anesthetic complications  Airway Mallampati: II  TM Distance: >3 FB Neck ROM: Full    Dental  (+) Edentulous Upper, Edentulous Lower   Pulmonary sleep apnea and Continuous Positive Airway Pressure Ventilation , COPD, Current Smoker and Patient abstained from smoking.,    breath sounds clear to auscultation + decreased breath sounds      Cardiovascular hypertension, Pt. on medications and Pt. on home beta blockers + Past MI  Normal cardiovascular exam Rhythm:Regular Rate:Normal     Neuro/Psych  Headaches, PSYCHIATRIC DISORDERS Anxiety  Neuromuscular disease    GI/Hepatic GERD  Medicated,  Endo/Other  diabetes, Well Controlled, Type 2, Oral Hypoglycemic AgentsMorbid obesity  Renal/GU      Musculoskeletal  (+) Arthritis , Osteoarthritis,    Abdominal   Peds  Hematology   Anesthesia Other Findings Multiple falls   Reproductive/Obstetrics                            Anesthesia Physical Anesthesia Plan  ASA: IV  Anesthesia Plan: General   Post-op Pain Management:    Induction: Intravenous  PONV Risk Score and Plan: 1  Airway Management Planned: Nasal Cannula, Natural Airway and Simple Face Mask  Additional Equipment:   Intra-op Plan:   Post-operative Plan:   Informed Consent: I have reviewed the patients History and Physical, chart, labs and discussed the procedure including the risks, benefits and alternatives for the proposed anesthesia with the patient or authorized representative who has indicated his/her understanding and acceptance.       Plan Discussed with: CRNA and  Surgeon  Anesthesia Plan Comments: (Will give Duoneb treatment and reevaluate her.)        Anesthesia Quick Evaluation

## 2020-01-15 NOTE — Discharge Instructions (Signed)
EGD Discharge instructions Please read the instructions outlined below and refer to this sheet in the next few weeks. These discharge instructions provide you with general information on caring for yourself after you leave the hospital. Your doctor may also give you specific instructions. While your treatment has been planned according to the most current medical practices available, unavoidable complications occasionally occur. If you have any problems or questions after discharge, please call your doctor. ACTIVITY  You may resume your regular activity but move at a slower pace for the next 24 hours.   Take frequent rest periods for the next 24 hours.   Walking will help expel (get rid of) the air and reduce the bloated feeling in your abdomen.   No driving for 24 hours (because of the anesthesia (medicine) used during the test).   You may shower.   Do not sign any important legal documents or operate any machinery for 24 hours (because of the anesthesia used during the test).  NUTRITION  Drink plenty of fluids.   You may resume your normal diet.   Begin with a light meal and progress to your normal diet.   Avoid alcoholic beverages for 24 hours or as instructed by your caregiver.  MEDICATIONS  You may resume your normal medications unless your caregiver tells you otherwise.  WHAT YOU CAN EXPECT TODAY  You may experience abdominal discomfort such as a feeling of fullness or gas pains.  FOLLOW-UP  Your doctor will discuss the results of your test with you.  SEEK IMMEDIATE MEDICAL ATTENTION IF ANY OF THE FOLLOWING OCCUR:  Excessive nausea (feeling sick to your stomach) and/or vomiting.   Severe abdominal pain and distention (swelling).   Trouble swallowing.   Temperature over 101 F (37.8 C).   Rectal bleeding or vomiting of blood.   I dilated your esophagus today  Small nodule in your stomach biopsied  Further recommendations to follow pending review of pathology  report  Office visit with [place] in 3 months  Continue AcipHex twice daily  Patient request, I called Foye Spurling at [phone] -reviewed results

## 2020-01-15 NOTE — Anesthesia Postprocedure Evaluation (Signed)
Anesthesia Post Note  Patient: [name]  Procedure(s) Performed: ESOPHAGOGASTRODUODENOSCOPY (EGD) WITH PROPOFOL (N/A ) MALONEY DILATION (N/A ) BIOPSY  Patient location during evaluation: PACU Anesthesia Type: General Level of consciousness: awake, oriented, awake and alert and patient cooperative Pain management: pain level controlled Respiratory status: spontaneous breathing, respiratory function stable, nonlabored ventilation and patient connected to nasal cannula oxygen Cardiovascular status: blood pressure returned to baseline and stable Postop Assessment: no headache and no backache Anesthetic complications: no   No complications documented.   Last Vitals:  Vitals:   01/15/20 1141  BP: (!) 112/47  Pulse: 80  Resp: 20  Temp: 37.1 C  SpO2: 92%    Last Pain:  Vitals:   01/15/20 1207  TempSrc:   PainSc: 0-No pain                 Tacy Learn

## 2020-01-15 NOTE — Telephone Encounter (Signed)
6 months on both place

## 2020-01-16 ENCOUNTER — Encounter: Payer: Self-pay | Admitting: Internal Medicine

## 2020-01-16 ENCOUNTER — Other Ambulatory Visit: Payer: Self-pay

## 2020-01-16 LAB — SURGICAL PATHOLOGY

## 2020-01-22 ENCOUNTER — Encounter (HOSPITAL_COMMUNITY): Payer: Self-pay | Admitting: Internal Medicine

## 2020-01-26 ENCOUNTER — Other Ambulatory Visit: Payer: Self-pay

## 2020-01-26 ENCOUNTER — Ambulatory Visit (HOSPITAL_COMMUNITY)
Admission: RE | Admit: 2020-01-26 | Discharge: 2020-01-26 | Disposition: A | Discharge status: Home / Self Care | Payer: Medicare Other | Source: Ambulatory Visit | Attending: Family Medicine | Admitting: Family Medicine

## 2020-01-26 DIAGNOSIS — Z1231 Encounter for screening mammogram for malignant neoplasm of breast: Secondary | ICD-10-CM | POA: Diagnosis not present

## 2020-01-29 ENCOUNTER — Ambulatory Visit (INDEPENDENT_AMBULATORY_CARE_PROVIDER_SITE_OTHER): Payer: Medicare Other | Admitting: Cardiothoracic Surgery

## 2020-01-29 ENCOUNTER — Ambulatory Visit
Admission: RE | Admit: 2020-01-29 | Discharge: 2020-01-29 | Disposition: A | Discharge status: Home / Self Care | Payer: Medicare Other | Source: Ambulatory Visit | Attending: Cardiothoracic Surgery | Admitting: Cardiothoracic Surgery

## 2020-01-29 ENCOUNTER — Other Ambulatory Visit: Payer: Self-pay

## 2020-01-29 VITALS — BP 102/68 | HR 80 | Temp 97.9°F | Resp 20 | Ht 69.0 in | Wt 228.0 lb

## 2020-01-29 DIAGNOSIS — J9811 Atelectasis: Secondary | ICD-10-CM | POA: Diagnosis not present

## 2020-01-29 DIAGNOSIS — I700 Atherosclerosis of aorta: Secondary | ICD-10-CM | POA: Diagnosis not present

## 2020-01-29 DIAGNOSIS — M47814 Spondylosis without myelopathy or radiculopathy, thoracic region: Secondary | ICD-10-CM | POA: Diagnosis not present

## 2020-01-29 DIAGNOSIS — R911 Solitary pulmonary nodule: Secondary | ICD-10-CM

## 2020-01-29 NOTE — Progress Notes (Signed)
[address]       [place], [address]             [phone]       [name]  Medical Record #[id number] Date of Birth: Apr 15, 1961  Referring: Kathyrn Drown, MD Primary Care: Kathyrn Drown, MD  Chief Complaint:    Lung Nodule   History of Present Illness:    Patient comes in today with a follow-up CT scan after missing appointment last year likely related to Covid.  She has been followed with serial CT scans for a incidental right middle lobe lung nodule found in July 2013   Patient is a long-term smoker she says since age 74, notes she smokes at least 1 cigarette every 4 hours. She was first noted in July 2013 to have a small incidental right middle lobe lung nodule, she has had serial CT scans to follow this nodule. The location makes it difficult to biopsy.   She has a history of myocardial infarction in being hospitalized for 5 days at age 67 no other details about this revealed. She does give a history of having a right-sided stroke in the past.   Since last seen she notes that she has had increasing pain inher was unsure that was correct. knees especially the right knee, he seemed to think she was having a right knee replacement tomorrow, but the social worker with her was not sure that was correct.  Current Activity/ Functional Status: Patient  is independent with mobility/ambulation, transfers, ADL's, IADL's. T     Past Medical History  Diagnosis Date  . Mental retardation   . Hyperglycemia   . HTN (hypertension)   . Migraines   . Anxiety   . IBS (irritable bowel syndrome)   . Diabetes mellitus   . Lung nodules     right, followed by PCP, PET 11/2011    Past Surgical History:  Procedure Laterality Date  . ABDOMINAL HYSTERECTOMY    . BIOPSY  01/15/2020   Procedure: BIOPSY;  Surgeon: Daneil Dolin, MD;  [facility]: [facility];  Service: Endoscopy;;  gastric  . COLONOSCOPY  11/2007   hyperplastic polyps, prior hx of  adenomas   . COLONOSCOPY  05/2010   incomplete due to poor prep, hyperplastic rectal polyp  . COLONOSCOPY  05/05/2002   Dimunitive polyps in the rectum and left colon, cold    biopsied/removed.  Scattered few left-sided diverticula.  Regular colonic   mucosa appeared normal  . COLONOSCOPY N/A 05/28/2013   Rourk: mulitple tubular adenomas removed. next tcs 05/2016  . COLONOSCOPY WITH PROPOFOL N/A 09/07/2016   Dr. Gala Romney: For hyperplastic polyps removed. Next colonoscopy March 2023 given history of adenomatous colon polyps in the past.  . ESOPHAGOGASTRODUODENOSCOPY  08/2007   moderate sized hiatal hernia  . ESOPHAGOGASTRODUODENOSCOPY  05/2010   noncritical schatzki ring s/p 52F  . ESOPHAGOGASTRODUODENOSCOPY (EGD) WITH ESOPHAGEAL DILATION N/A 02/06/2013   XVQ:[id number] esophagus-s/p dilation up to a 55 [place] size with [facility] dilators.  Hiatal hernia  . ESOPHAGOGASTRODUODENOSCOPY (EGD) WITH PROPOFOL N/A 09/07/2016   Dr. Gala Romney: Normal, status post empiric dilation of the esophagus for history of dysphagia  . ESOPHAGOGASTRODUODENOSCOPY (EGD) WITH PROPOFOL N/A 05/02/2019   Normal esophagus s/p dilation, normal stomach, normal duodenum  . ESOPHAGOGASTRODUODENOSCOPY (EGD) WITH PROPOFOL N/A 01/15/2020   Procedure: ESOPHAGOGASTRODUODENOSCOPY (EGD) WITH PROPOFOL;  Surgeon: Daneil Dolin, MD;  [facility]: [facility];  Service: Endoscopy;  Laterality: N/A;  12:45pm  . EXTERNAL EAR SURGERY     bilateral  . FOOT SURGERY    . GLAUCOMA SURGERY    . MALONEY DILATION N/A 09/07/2016   Procedure: Venia Minks DILATION;  Surgeon: Daneil Dolin, MD;  [facility]: [facility];  Service: Endoscopy;  Laterality: N/A;  . Venia Minks DILATION N/A 05/02/2019   Procedure: Venia Minks DILATION;  Surgeon: Daneil Dolin, MD;  [facility];  Service: Endoscopy;  Laterality: N/A;  . Venia Minks DILATION N/A 01/15/2020   Procedure: Venia Minks DILATION;  Surgeon: Daneil Dolin, MD;  [facility];  Service: Endoscopy;   Laterality: N/A;  . POLYPECTOMY  09/07/2016   Procedure: POLYPECTOMY;  Surgeon: Daneil Dolin, MD;  Location: [facility];  Service: Endoscopy;;  sigmoid colon x4  . small bowel capsule  10/2007   normal    Family History  Problem Relation Age of Onset  . Stroke Mother   . Heart attack Father   . Schizophrenia Other   . Drug abuse Other   . Alcohol abuse Other   . Colon cancer Other        aunt  . Obesity Other   . COPD Other   . GER disease Other   . Diabetes type II Other   . Anxiety disorder Other   . Depression Other   . Depression Sister   . Schizophrenia Sister   . Liver disease Neg Hx   . Inflammatory bowel disease Neg Hx       Social History   Tobacco Use  Smoking Status Current Every Day Smoker  . Packs/day: 0.25  . Years: 40.00  . Pack years: 10.00  . Types: Cigarettes  Smokeless Tobacco Never Used    History  Alcohol Use No   Says does not drink alcohol      Allergies  Allergen Reactions  . Thorazine [Chlorpromazine Hcl] Anaphylaxis  . Acetaminophen Other (See Comments)    Makes pt dizzy  . Aspirin Other (See Comments)    seizure  . Aspirin-Acetaminophen-Caffeine Other (See Comments)    seizure  . Nsaids Nausea And Vomiting  . Other     Acidic foods  . Penicillins Nausea And Vomiting    Has patient had a PCN reaction causing immediate rash, facial/tongue/throat swelling, SOB or lightheadedness with hypotension:Yes Has patient had a PCN reaction causing severe rash involving mucus membranes or skin necrosis:Yes Has patient had a PCN reaction that required hospitalization:Yes Has patient had a PCN reaction occurring within the last 10 years:No If all of the above answers are "NO", then may proceed with Cephalosporin use.   . Tomato Rash    Current Outpatient Medications  Medication Sig Dispense Refill  . atorvastatin (LIPITOR) 10 MG tablet TAKE 1 TABLET BY MOUTH ONCE A DAY. 30 tablet 5  . benztropine (COGENTIN) 1 MG tablet Take 1 tablet  (1 mg total) by mouth daily. 90 tablet 2  . dicyclomine (BENTYL) 10 MG capsule May use one up to 3 times a day as needed for abdominal cramps (Patient taking differently: Take 10 mg by mouth 3 (three) times daily as needed (abdominal cramps.). ) 90 capsule 5  . lamoTRIgine (LAMICTAL) 100 MG tablet Take 1 tablet (100 mg total) by mouth 2 (two) times daily. 180 tablet 2  . lisinopril (ZESTRIL) 2.5 MG tablet TAKE (1) TABLET BY MOUTH EACH MORNING. 30 tablet 0  . paliperidone (INVEGA) 9 MG 24 hr tablet Take 1 tablet (9 mg total) by mouth daily.  90 tablet 2  . polyethylene glycol (MIRALAX / GLYCOLAX) 17 g packet Take 17 g by mouth daily as needed (mild constipation.).    Marland Kitchen potassium chloride (KLOR-CON) 10 MEQ tablet Take 2 tablets po in the morning and one tablet po in the evening. (Patient taking differently: Take 10-20 mEq by mouth See admin instructions. Take 2 tablets (20 meq) by mouth in the morning & take 1 tablet (10 meq) by mouth in the evening.) 90 tablet 5  . propranolol (INDERAL) 10 MG tablet TAKE 1 TABLET BY MOUTH THREE TIMES A DAY. 90 tablet 0  . RABEprazole (ACIPHEX) 20 MG tablet Take 1 tablet (20 mg total) by mouth 2 (two) times daily before a meal. 60 tablet 11  . sertraline (ZOLOFT) 100 MG tablet Take 100 mg by mouth daily.    Marland Kitchen torsemide (DEMADEX) 20 MG tablet TAKE (1) TABLET BY MOUTH EACH MORNING. 30 tablet 5  . traZODone (DESYREL) 100 MG tablet Take 200 mg by mouth at bedtime.     No current facility-administered medications for this visit.       Review of Systems:  Physical Exam: .BP 102/68   Pulse 80   Temp 97.9 F (36.6 C) (Skin)   Resp 20   Ht 5\' 9"  (1.753 m)   Wt (!) 228 lb (103.4 kg)   SpO2 97% Comment: RA  BMI 33.67 kg/m  General appearance: alert, cooperative and slowed mentation Lymph nodes: Cervical, supraclavicular, and axillary nodes normal. Resp: clear to auscultation bilaterally Cardio: regular rate and rhythm, S1, S2 normal, no murmur, click, rub or  gallop GI: soft, non-tender; bowel sounds normal; no masses,  no organomegaly Extremities: extremities normal, atraumatic, no cyanosis or edema Neurologic: Grossly normal  Diagnostic Studies & Laboratory data:     Recent Radiology Findings:  CT CHEST WO CONTRAST  Result Date: 01/29/2020 CLINICAL DATA:  Follow-up pulmonary nodule. EXAM: CT CHEST WITHOUT CONTRAST TECHNIQUE: Multidetector CT imaging of the chest was performed following the standard protocol without IV contrast. COMPARISON:  CT chest March 22, 2019 FINDINGS: Cardiovascular: Normal heart size. Trace fluid superior pericardial recess. Thoracic aortic vascular calcifications. Mediastinum/Nodes: No enlarged axillary, mediastinal or hilar lymphadenopathy. Normal appearance of the esophagus. Lungs/Pleura: Central airways are patent. Dependent atelectasis within the bilateral lower lobes. No large area pulmonary consolidation. No pleural effusion or pneumothorax. Similar-appearing 1.8 x 1.2 cm right middle lobe nodule (image 83; series 8) when compared with recent exams. This is increased in size when compared with more remote prior exams. Upper Abdomen: No acute process. Musculoskeletal: Thoracic spine degenerative changes. No aggressive or acute appearing osseous lesions. IMPRESSION: 1. Similar-appearing 1.8 cm right middle lobe nodule when compared with recent prior exams. This is increased in size when compared with more remote prior exams. As mentioned on prior exam, slow growing indolent neoplastic process is not entirely excluded. 2. Aortic atherosclerosis. Electronically Signed   By: Lovey Newcomer M.D.   On: 01/29/2020 14:00   I have independently reviewed the above radiology studies  and reviewed the findings with the patient.    Ct Chest Wo Contrast  Result Date: 12/21/2016 CLINICAL DATA:  Followup of right-sided pulmonary nodule. EXAM: CT CHEST WITHOUT CONTRAST TECHNIQUE: Multidetector CT imaging of the chest was performed  following the standard protocol without IV contrast. COMPARISON:  04/20/2016 FINDINGS: Cardiovascular: Aortic and branch vessel atherosclerosis. Borderline cardiomegaly, without pericardial effusion. Lad coronary artery atherosclerosis. Mediastinum/Nodes: Small bilateral axillary nodes are unchanged and not pathologic by size criteria. No mediastinal or  definite hilar adenopathy, given limitations of unenhanced CT. Lungs/Pleura: No pleural fluid. Mild motion degradation inferiorly. Right middle lobe pulmonary nodule is in the region of motion degradation. 1.8 x 1.0 cm on image 62/series 4. Compare 1.6 x 1.0 cm on the prior exam. 2.0 cm craniocaudal on image 61 coronal, similar. No new pulmonary nodule identified. Upper Abdomen: Moderate hepatic steatosis with sparing in the posterior aspect of segment 4. Normal imaged portions of the spleen, stomach, pancreas, adrenal glands, kidneys. Musculoskeletal: No acute osseous abnormality. IMPRESSION: 1. Mild motion degradation, including through the nodule of concern in the right middle lobe. 2. Given this limitation, the right middle lobe nodules is felt to be similar over 6 months. Consider repeat exam at 3-6 months, given today's limitations. 3. Age advanced coronary artery atherosclerosis. Recommend assessment of coronary risk factors and consideration of medical therapy. 4.  Aortic Atherosclerosis (ICD10-I70.0). 5. Hepatic steatosis. Electronically Signed   By: Abigail Miyamoto M.D.   On: 12/21/2016 11:41    Ct Chest Wo Contrast  Result Date: 04/20/2016 CLINICAL DATA:  Followup pulmonary nodule EXAM: CT CHEST WITHOUT CONTRAST TECHNIQUE: Multidetector CT imaging of the chest was performed following the standard protocol without IV contrast. COMPARISON:  02/11/2015 FINDINGS: Cardiovascular: Aortic atherosclerosis. Calcification in the LAD coronary artery noted. Normal heart size. No pericardial effusion. Mediastinum/Nodes: No enlarged mediastinal or axillary lymph  nodes. Thyroid gland, trachea, and esophagus demonstrate no significant findings. Lungs/Pleura: No pleural effusion. Mild changes of emphysema. Diffuse bronchial wall thickening noted. The nodule of concern within the right middle lobe measures 1.6 by 1.0 cm, image 69 of series 4. Mean diameter is equal to 1.3 cm. On the study from 2014 this had a mean diameter of 9 mm. No new pulmonary nodules identified. Upper Abdomen: No acute abnormality.The adrenal glands appear normal. Mild hepatic steatosis. Musculoskeletal: No chest wall mass or suspicious bone lesions identified. IMPRESSION: 1. The nodule within the right middle lobe has a mean diameter 13 mm on today's study. This is compared with 9 mm in 2014. Although this has a nonaggressive appearance and is somewhat circumscribed given the interval growth a PET-CT is advised. 2. Aortic atherosclerosis.  LAD coronary artery calcification noted. Electronically Signed   By: Kerby Moors M.D.   On: 04/20/2016 11:33   Ct Chest Wo Contrast  02/11/2015   CLINICAL DATA:  Follow-up lung nodule. Mid left chest pain and productive cough. History of right breast cancer.  EXAM: CT CHEST WITHOUT CONTRAST  TECHNIQUE: Multidetector CT imaging of the chest was performed following the standard protocol without IV contrast.  COMPARISON:  Chest CT 01/27/2014. ; chest CT 01/15/2014 ; chest CT 11/01/2011.  FINDINGS: Mediastinum/Nodes: Visualized thyroid is unremarkable. No enlarged axillary, mediastinal or hilar lymphadenopathy. Normal heart size.  Lungs/Pleura: Central airways are patent. No consolidative pulmonary opacities. There is an 11 x 8 mm well-circumscribed nodule within the medial segment of the right middle lobe (image 38; series 4). This is unchanged from prior exam 01/27/2014 and minimally increased in size from CT 11/01/2011. No other suspicious appearing pulmonary nodules or masses. No pleural effusion or pneumothorax.  Upper abdomen: Hepatic steatosis.   Musculoskeletal: No aggressive or acute appearing osseous lesions.  IMPRESSION: No significant interval change in size of oval circumscribed nodule within the medial segment right middle lobe. Given stability over time, this is favored to represent a benign process. As discussed on prior CT, an additional followup chest CT in 1-2 years is recommended to ensure continued stability.  Electronically Signed   By: Lovey Newcomer M.D.   On: 02/11/2015 11:10   Ct Chest Wo Contrast  01/15/2014   CLINICAL DATA:  Shortness of breath. Cough. Hemoptysis. History of smoking. Followup evaluation of lung nodule.  EXAM: CT CHEST WITHOUT CONTRAST  TECHNIQUE: Multidetector CT imaging of the chest was performed following the standard protocol without IV contrast.  COMPARISON:  Chest CT 01/02/2013.  FINDINGS: Mediastinum: Heart size is normal. There is no significant pericardial fluid, thickening or pericardial calcification. There is atherosclerosis of the thoracic aorta, the great vessels of the mediastinum and the coronary arteries, including calcified atherosclerotic plaque in the left anterior descending coronary artery. No pathologically enlarged mediastinal or hilar lymph nodes. Please note that accurate exclusion of hilar adenopathy is limited on noncontrast CT scans. Esophagus is unremarkable in appearance.  Lungs/Pleura: 12 x 7 mm well-circumscribed nodule in the mid medial segment of the right middle lobe (image 38 of series 4) is only slightly larger than remote prior study 11/01/2011 at which point it measured 11 x 7 mm when measured in a similar fashion on image 34 of series 3 of that examination. No other suspicious appearing pulmonary nodules or masses. No acute consolidative airspace disease. No pleural effusions.  Upper Abdomen: Unremarkable.  Musculoskeletal: There are no aggressive appearing lytic or blastic lesions noted in the visualized portions of the skeleton.  IMPRESSION: 1. 12 x 7 mm well-circumscribed nodule  in the mid medial segment of the right middle lobe (image 38 of series 4) is only slightly larger than remote prior study 11/01/2011 at which point it measured 11 x 7 mm when measured in a similar fashion on image 34 of series 3 of that examination. Given the relative stability over this 2 year time interval, this is strongly favored to represent a benign lesion, potentially a hammartoma (some hammartomas contain brown fat, which may account for low-level metabolic activity on the prior PET-CT). Another differential consideration would be in a low-grade carcinoid tumor. An additional noncontrast chest CT in 1-2 years is suggested to ensure continued stability.   Electronically Signed   By: Vinnie Langton M.D.   On: 01/15/2014 13:24  Ct Chest Wo Contrast  01/02/2013   *RADIOLOGY REPORT*  Clinical Data: Right lung nodule.  History of breast cancer.  CT CHEST WITHOUT CONTRAST  Technique:  Multidetector CT imaging of the chest was performed following the standard protocol without IV contrast.  Comparison: Chest CT 04/22/2012.  Findings: The right middle lobe pulmonary nodule is unchanged. Measures 10.7 x 6.9 mm and previous measure 10.3 x 7.4 mm.  No new nodules are identified.  No acute pulmonary findings.  No pleural effusion.  The heart is normal in size.  No pericardial effusion.  No mediastinal or hilar lymphadenopathy.  Small scattered lymph nodes are stable.  The aorta is normal in caliber.  The esophagus is grossly normal.  The chest wall is unremarkable.  Stable calcifications of the right breast.  No supraclavicular or axillary adenopathy.  Stable bilateral axillary lymph nodes.  The thyroid gland is normal.  The bony thorax is intact.  IMPRESSION:  Stable right middle lobe pulmonary nodule.  Recommend follow-up noncontrast chest CT in 1 year to document stability.   Original Report Authenticated By: Marijo Sanes, M.D.     Ct Chest W Contrast  04/22/2012  *RADIOLOGY REPORT*  Clinical Data: Follow-up  pulmonary nodule.  CT CHEST WITH CONTRAST  Technique:  Multidetector CT imaging of the chest was performed following  the standard protocol during bolus administration of intravenous contrast.  Contrast: 106mL OMNIPAQUE IOHEXOL 300 MG/ML  SOLN  Comparison: PET 11/29/2011 and CT chest 11/01/2011.  Findings: No pathologically enlarged mediastinal, hilar or axillary lymph nodes.  Heart size normal.  No pericardial effusion.  A nodule in the right middle lobe measures 10 x 7 mm, stable from 11/01/2011 when remeasured.  A tiny subpleural nodular density in the right lower lobe measures 3 mm (image 42), stable.  No pleural fluid.  Airway is unremarkable.  Incidental imaging of the upper abdomen shows low attenuation throughout the visualized portion of the liver.  No worrisome lytic or sclerotic lesions.  IMPRESSION: Right middle lobe nodule is unchanged from 11/01/2011.  Given mild hypermetabolism on 11/29/2011, a follow-up interval of 6 months is recommended to confirm continued stability.   Original Report Authenticated By: Luretha Rued, M.D.      11/09/2011 RADIOLOGY REPORT*  Clinical Data: Right basilar pulmonary nodule on abdominal CT.  Dysphagia. The patient has diabetes mellitus and unspecified  mental retardation.  CT CHEST WITH CONTRAST  Technique: Multidetector CT imaging of the chest was performed  following the standard protocol during bolus administration of  intravenous contrast.  Contrast: 62mL OMNIPAQUE IOHEXOL 300 MG/ML SOLN  Comparison: Abdominal CTs 10/10/2011 and 01/19/2005. Chest  radiographs 10/23/2001.  Findings: As demonstrated on the recent abdominal CT, there is a  lobulated well circumscribed 10 x 9 mm right middle lobe nodule on  image 35. This is noncalcified. This area of the lung bases was  not imaged on the 2006 CT. No other pulmonary nodules are  identified. There is no airspace disease or confluent airspace  opacity.  There are no enlarged mediastinal or hilar  lymph nodes. There is  no pleural or pericardial effusion. Minimal aortic atherosclerosis  is noted.  Images through the upper abdomen demonstrate diffuse hepatic  steatosis. There is no adrenal mass.  IMPRESSION:  1. Lobulated noncalcified 1-cm right middle lobe nodule was not  imaged on prior abdominal CT. This nodule is not conclusively  demonstrated on radiographs such that stability cannot be  addressed.  2. This nodule is nonspecific and could reflect a hamartoma or  other benign lesion. However, malignancy cannot be excluded.  3. No other nodules, adenopathy or acute findings.  This nodule would be difficult to biopsy given its close proximity  to the heart. Management options include PET CT to assess for  hypermetabolic activity and follow-up CT (depending on risk factors  for malignancy). If follow-up is elected, follow-up chest CT at 3  months is recommended. This recommendation is adapted from the  consensus statement: Guidelines for Management of Small Pulmonary  Nodules Detected on CT Scans: A Statement from the [employer] as published in Radiology 2005; 237:395-400.  Original Report Authenticated By: Vivia Ewing, M.D.  Halina Andreas, MD Tue Jan 09, 2012 2:52:39 PM EDT       **ADDENDUM** CREATED: 01/09/2012 14:43:25  Voice recognition errors:  Within the Findings section, the second sentence of the CHEST  section should read: "NO additional hypermetabolic nodules are  present."  Within the first IMPRESSION, the last phrase of the first sentence  should read: "for a nodule this SIZE."  Findings discussed with Dr. Servando Snare on 07/ 08/2011  **END ADDENDUM** SIGNED BY: Suzy Bouchard, M.D.      Study Result     *RADIOLOGY REPORT*  Clinical Data: Recent imaging demonstrates a solitary pulmonary  nodule. FDG PET CT requested  to evaluate for possible malignancy.  NUCLEAR MEDICINE PET SKULL BASE TO THIGH  Fasting Blood Glucose: 113  Technique:  18.4 mCi F-18 FDG was injected intravenously via the  right antecubital fossa. CT data was obtained and used for  attenuation correction and anatomic localization only. (This was  not acquired as a diagnostic CT examination.) Additional exam  technical data entered on technologist worksheet.  Comparison: CT 11/01/2011  Findings:  Head/Neck: No hypermetabolic lymph nodes in the neck.  Chest: Within the right middle lobe there is a 8 mm pulmonary  nodule adjacent to the right atrium with mild metabolic activity (  SUV max = 2.5). Additional hypermetabolic nodules are present. No  hypermetabolic mediastinal lymph nodes.  There is a single hypermetabolic right axillary lymph node which is  normal pathology. There is misregistration in the right axilla due  to patient motion.  Abdomen/Pelvis: No abnormal hypermetabolic activity within the  liver, pancreas, adrenal glands, or spleen. No hypermetabolic  lymph nodes in the abdomen or pelvis.  Skeleton: No focal hypermetabolic activity to suggest skeletal  metastasis. Sclerosis of the left SI joint appears benign.  IMPRESSION:  1. Mildly hypermetabolic nodule within the right middle lobe.  Although the activity is mild, for a nodule this side this is  concerning although nonspecific. If biopsy is not feasible due to  location, recommend short-term follow-up CT with contrast in 1 to  3 months.  2. No evidence of metastasis.  3. Hypermetabolic right axial lymph node is likely reactive.  Original Report Authenticated By: Suzy Bouchard, M.D.       Recent Lab Findings: Lab Results  Component Value Date   WBC 5.0 10/03/2019   HGB 13.1 10/03/2019   HCT 39.2 10/03/2019   PLT 232 10/03/2019   GLUCOSE 97 01/13/2020   CHOL 119 10/03/2019   TRIG 66 10/03/2019   HDL 66 10/03/2019   LDLCALC 39 10/03/2019   ALT 6 10/03/2019   AST 13 10/03/2019   NA 141 01/13/2020   K 3.3 (L) 01/13/2020   CL 96 (L) 01/13/2020   CREATININE 0.80 01/13/2020     BUN 5 (L) 01/13/2020   CO2 33 (H) 01/13/2020   TSH 4.492 03/22/2019   HGBA1C 6.4 (H) 10/03/2019      Assessment / Plan:   Right middle lobe lung nodule- very slow growing over 8 years- 2- 3 mm ,   possible hamartoma. Over 5 years volume has doubled , little change since last ct .  With the patient's ongoing smoking, and  poor surgical candidate will continue to follow , will obtain a CT of the chest in 12 months.  With the patient's long-term smoking history and overall physical condition should be a poor candidate for lung resection.   I have again reviewed with her the need to stop smoking    Grace Isaac MD  [place] [facility] [phone] 01/29/2020 2:45 PM

## 2020-02-18 ENCOUNTER — Encounter: Payer: Self-pay | Admitting: Family Medicine

## 2020-02-18 ENCOUNTER — Ambulatory Visit (INDEPENDENT_AMBULATORY_CARE_PROVIDER_SITE_OTHER): Payer: Medicare Other | Admitting: Family Medicine

## 2020-02-18 ENCOUNTER — Other Ambulatory Visit: Payer: Self-pay

## 2020-02-18 VITALS — BP 122/74 | Temp 98.1°F | Ht 69.0 in | Wt 228.0 lb

## 2020-02-18 DIAGNOSIS — R296 Repeated falls: Secondary | ICD-10-CM

## 2020-02-18 DIAGNOSIS — I10 Essential (primary) hypertension: Secondary | ICD-10-CM

## 2020-02-18 DIAGNOSIS — E119 Type 2 diabetes mellitus without complications: Secondary | ICD-10-CM | POA: Diagnosis not present

## 2020-02-18 DIAGNOSIS — R270 Ataxia, unspecified: Secondary | ICD-10-CM

## 2020-02-18 DIAGNOSIS — Z79899 Other long term (current) drug therapy: Secondary | ICD-10-CM | POA: Diagnosis not present

## 2020-02-18 DIAGNOSIS — E639 Nutritional deficiency, unspecified: Secondary | ICD-10-CM | POA: Diagnosis not present

## 2020-02-18 DIAGNOSIS — M170 Bilateral primary osteoarthritis of knee: Secondary | ICD-10-CM

## 2020-02-18 NOTE — Progress Notes (Addendum)
Subjective:    Patient ID: [name], female    DOB: February 17, 1961, 59 y.o.   MRN: [id number]  HPI pt arrives with social worker [name].  bilateral knee pain. Going on the 16th for consult for knee surgery.   Has been falling a lot. Would like to restart physical therapy. Hit head last Friday.   It is very questionable if she is eating properly she states he does eat some food in the morning if it is brought into her otherwise she does not eat till suppertime often eats what her aide fixes her or what she buys she does not fix food on a regular basis and does not eat on a regular basis in addition to this she does not drink fluids on a regular basis She does relate compliance with her medicines but has a very difficult time letting me know what her medicine regimen is She states she is fallen several x1 time it was witnessed the other times it was not It is sometimes difficult to know the full extent of her issues based upon her history tendencies Review of Systems She relates she aches a lot mainly in her knees and back denies any chest pain or shortness of breath vomiting diarrhea or passing out    Objective:   Physical Exam Lungs clear respiratory rate normal heart regular no murmurs pulse normal BP is low normal extremities some edema in the ankles but not severe significant osteoarthritis of the knees noted.  Blood pressure does drop when she stands       Assessment & Plan:  1. Poor nutrition Poor nutrition encourage her to eat several times per day this is a challenge for the patient she lives in an apartment with the aid but would do better in home assisted area like a home assisted living place but patient wants to stay where she is.  She does have Education officer, [employer] who helps her  2. Primary osteoarthritis of both knees Severe osteoarthritis of both knees.  If she does decide to do surgery she would need to go into a rehab facility afterwards before going home.  3. Diabetes  mellitus without complication (HCC) Apparently under good control but she does not check her sugars on a regular basis we will look at the A1c  4. Ataxia Patient has some orthostatic hypotension stop the lisinopril.  Do a better job eating and drinking.  Use her cane or walker also will go ahead with home physical therapy because of frequent falls to help with leg strengthening  Due to her frequent falls as well as ataxia and weakness it is of my opinion that this patient would benefit from having a wheelchair to utilize when going to appointments shopping or any other type of situation that puts her at risk of falling  5. Frequent falls Patient is homebound she would benefit from home physical therapy  6. Essential hypertension Blood pressure actually on the low end recommend stopping lisinopril  History of anemia check CBC Although I believe she could do surgery she would definitely need rehab because she could not be able to rehab on her own at her apartment.  It is possible this patient will have some difficulty recovering from her knee surgery because of her tendencies from a mental health standpoint but I do believe that she would be able to do the surgery.  Based upon her poor physical activity I would recommend cardiology consultation for presurgical clearance-obviously wait for her to see  orthopedics to see what they have to say

## 2020-02-19 LAB — CBC WITH DIFFERENTIAL/PLATELET
Basophils Absolute: 0 10*3/uL (ref 0.0–0.2)
Basos: 1 %
EOS (ABSOLUTE): 0 10*3/uL (ref 0.0–0.4)
Eos: 1 %
Hematocrit: 39.8 % (ref 34.0–46.6)
Hemoglobin: 13.4 g/dL (ref 11.1–15.9)
Immature Grans (Abs): 0 10*3/uL (ref 0.0–0.1)
Immature Granulocytes: 0 %
Lymphocytes Absolute: 1.9 10*3/uL (ref 0.7–3.1)
Lymphs: 41 %
MCH: 29.1 pg (ref 26.6–33.0)
MCHC: 33.7 g/dL (ref 31.5–35.7)
MCV: 86 fL (ref 79–97)
Monocytes Absolute: 0.6 10*3/uL (ref 0.1–0.9)
Monocytes: 12 %
Neutrophils Absolute: 2 10*3/uL (ref 1.4–7.0)
Neutrophils: 45 %
Platelets: 218 10*3/uL (ref 150–450)
RBC: 4.61 x10E6/uL (ref 3.77–5.28)
RDW: 13.4 % (ref 11.7–15.4)
WBC: 4.5 10*3/uL (ref 3.4–10.8)

## 2020-02-19 LAB — HEPATIC FUNCTION PANEL
ALT: 9 IU/L (ref 0–32)
AST: 15 IU/L (ref 0–40)
Albumin: 4.2 g/dL (ref 3.8–4.9)
Alkaline Phosphatase: 100 IU/L (ref 48–121)
Bilirubin Total: 0.3 mg/dL (ref 0.0–1.2)
Bilirubin, Direct: 0.12 mg/dL (ref 0.00–0.40)
Total Protein: 6.9 g/dL (ref 6.0–8.5)

## 2020-02-19 LAB — BASIC METABOLIC PANEL
BUN/Creatinine Ratio: 6 — ABNORMAL LOW (ref 9–23)
BUN: 5 mg/dL — ABNORMAL LOW (ref 6–24)
CO2: 32 mmol/L — ABNORMAL HIGH (ref 20–29)
Calcium: 8.9 mg/dL (ref 8.7–10.2)
Chloride: 92 mmol/L — ABNORMAL LOW (ref 96–106)
Creatinine, Ser: 0.87 mg/dL (ref 0.57–1.00)
GFR calc Af Amer: 86 mL/min/{1.73_m2} (ref >59)
GFR calc non Af Amer: 74 mL/min/{1.73_m2} (ref >59)
Glucose: 87 mg/dL (ref 65–99)
Potassium: 3.6 mmol/L (ref 3.5–5.2)
Sodium: 136 mmol/L (ref 134–144)

## 2020-02-19 LAB — HEMOGLOBIN A1C
Est. average glucose Bld gHb Est-mCnc: 134 mg/dL
Hgb A1c MFr Bld: 6.3 % — ABNORMAL HIGH (ref 4.8–5.6)

## 2020-02-20 ENCOUNTER — Encounter: Payer: Self-pay | Admitting: Family Medicine

## 2020-02-23 ENCOUNTER — Telehealth: Payer: Self-pay | Admitting: Family Medicine

## 2020-02-23 DIAGNOSIS — M170 Bilateral primary osteoarthritis of knee: Secondary | ICD-10-CM | POA: Diagnosis not present

## 2020-02-23 DIAGNOSIS — M25562 Pain in left knee: Secondary | ICD-10-CM | POA: Diagnosis not present

## 2020-02-23 DIAGNOSIS — M25561 Pain in right knee: Secondary | ICD-10-CM | POA: Diagnosis not present

## 2020-02-23 DIAGNOSIS — G8929 Other chronic pain: Secondary | ICD-10-CM | POA: Diagnosis not present

## 2020-02-23 NOTE — Telephone Encounter (Signed)
FYI ONLY  Social worker said that Dr. Nicki Reaper wanted an update after [name] saw the doctor today. Doctor said he will not do knee surgery on her until she quits smoking.  He said no surgeon would operate on her until she stops.  He will try synvisc injections on her knees to see if that helps.  No call back needed, just fyi.

## 2020-03-09 ENCOUNTER — Telehealth: Payer: Self-pay | Admitting: Family Medicine

## 2020-03-09 NOTE — Telephone Encounter (Signed)
Cindy with [facility] states unable to contact patient to setup home care. She also spoke with social worker to let her know unable to reach patient-Cindy-[phone]

## 2020-03-15 ENCOUNTER — Other Ambulatory Visit: Payer: Self-pay | Admitting: Family Medicine

## 2020-03-22 ENCOUNTER — Telehealth: Payer: Self-pay | Admitting: *Deleted

## 2020-03-22 NOTE — Telephone Encounter (Signed)
I certainly understand thank you

## 2020-03-22 NOTE — Telephone Encounter (Signed)
Joey physical therapist with [facility] calling to report he went out to see her today and they will have to discontinue services due to it not being a safe enviroment for PT. States she has not been checking her glucose because she has not had a meter in several months and states she periodically forgets to take her meds. States she needs more supervision to be able to thrive. Joey states he did discuss this with her case worker jennifer Joey's number [phone]

## 2020-03-24 ENCOUNTER — Telehealth: Payer: Self-pay | Admitting: Family Medicine

## 2020-03-24 ENCOUNTER — Other Ambulatory Visit: Payer: Self-pay | Admitting: *Deleted

## 2020-03-24 MED ORDER — BLOOD GLUCOSE METER KIT: PACK | 0 refills | Status: DC

## 2020-03-24 NOTE — Telephone Encounter (Signed)
See below and also notes from [facility] came over on pt and [name] had asked me if we received anything and at that time we didn't but she said she was going to call them and have it sent over for you to review. Notes in your folder on the wall.

## 2020-03-24 NOTE — Telephone Encounter (Signed)
[name] states she would like for her to have therapy because she does better with it. I told her we would send over rx for glucose monitor. [name] is going to call [facility] and let us know what she finds out about knee surgery.

## 2020-03-24 NOTE — Telephone Encounter (Signed)
Left message to return call 

## 2020-03-24 NOTE — Telephone Encounter (Signed)
Patient was not responding to [facility] who was trying to help her with her home needs and care. [name] her care giver sates she wont do what they ask and wanting some other home care for her. She is requesting glu close machine called into [place]

## 2020-03-25 DIAGNOSIS — M170 Bilateral primary osteoarthritis of knee: Secondary | ICD-10-CM | POA: Diagnosis not present

## 2020-03-25 NOTE — Telephone Encounter (Signed)
[name] notified and would let us know If they ended up needing anything.

## 2020-03-25 NOTE — Telephone Encounter (Signed)
[name] I reviewed over the information it looks like they are doing injections in her knees to help her.  She is not a good surgical candidate currently.  If they need any type of order for physical therapy at the local physical therapy department please let us know.  Otherwise it looks like the specialists are handling this issue.

## 2020-03-31 DIAGNOSIS — M170 Bilateral primary osteoarthritis of knee: Secondary | ICD-10-CM | POA: Diagnosis not present

## 2020-03-31 DIAGNOSIS — Z888 Allergy status to other drugs, medicaments and biological substances status: Secondary | ICD-10-CM | POA: Diagnosis not present

## 2020-03-31 DIAGNOSIS — Z880 Allergy status to penicillin: Secondary | ICD-10-CM | POA: Diagnosis not present

## 2020-03-31 DIAGNOSIS — Z886 Allergy status to analgesic agent status: Secondary | ICD-10-CM | POA: Diagnosis not present

## 2020-04-01 ENCOUNTER — Other Ambulatory Visit: Payer: Self-pay

## 2020-04-01 ENCOUNTER — Other Ambulatory Visit: Payer: Self-pay | Admitting: *Deleted

## 2020-04-01 ENCOUNTER — Encounter: Payer: Self-pay | Admitting: Gastroenterology

## 2020-04-01 ENCOUNTER — Ambulatory Visit (INDEPENDENT_AMBULATORY_CARE_PROVIDER_SITE_OTHER): Payer: Medicare Other | Admitting: Gastroenterology

## 2020-04-01 VITALS — BP 105/103 | HR 67 | Temp 97.6°F | Ht 69.0 in | Wt 227.2 lb

## 2020-04-01 DIAGNOSIS — K580 Irritable bowel syndrome with diarrhea: Secondary | ICD-10-CM | POA: Diagnosis not present

## 2020-04-01 DIAGNOSIS — R1310 Dysphagia, unspecified: Secondary | ICD-10-CM | POA: Diagnosis not present

## 2020-04-01 DIAGNOSIS — N939 Abnormal uterine and vaginal bleeding, unspecified: Secondary | ICD-10-CM

## 2020-04-01 NOTE — Progress Notes (Signed)
Referring Provider: Kathyrn Drown, MD Primary Care Physician:  Kathyrn Drown, MD Primary GI: Dr. Gala Romney   Chief Complaint  Patient presents with   Dysphagia    had some issues off/on    HPI:   [name] is a 59 y.o. female presenting today with a history of GERD, IBS,dysphagia,colonoscopy due in 2023 (history of adenomas). EGD with empiric dilation completed Oct 2020.Dysphagia recurrent, with BPE updated Jan 2021 with relative narrowing of GE junction, temporarily obstructed 12.5 mm tablet but passed following multiple swallows of water and thin barium. Esophageal dysmotility. Early interval EGD July 2021 with normal esophagus s/p dilation, gastric nodule s/p biopsy. This showed reactive gastropathy with H.pylori.   Caretaker states that she received treatment for H.pylori. I do not have verification of this or what regimen.  Vaginal bleeding noted again today. Saw bleeding last month. Just with wiping. Sometimes dysuria but not all the time. Some lower abdominal cramping with vaginal bleeding. If not bleeding, will have small amount of cramping but worse with bleeding. States she had pain yesterday but caretaker stated she did not mention this.   Using Bentyl for cramping. Unclear if helping. 5-6 loose stools per day. Swallowing is improved overall.  Difficult historian. Distracted today and difficult to keep on track.    Past Medical History:  Diagnosis Date   Anxiety    Diabetes mellitus    Diabetes mellitus, type II ([place])    HTN (hypertension)    Hyperglycemia    IBS (irritable bowel syndrome)    Lung nodules    right, followed by PCP, PET 11/2011   Mental retardation    MI (myocardial infarction) ([place])    Migraines    Sleep apnea     Past Surgical History:  Procedure Laterality Date   ABDOMINAL HYSTERECTOMY     BIOPSY  01/15/2020   Procedure: BIOPSY;  Surgeon: Daneil Dolin, MD;  [facility];  Service: Endoscopy;;  gastric    COLONOSCOPY  11/2007   hyperplastic polyps, prior hx of adenomas    COLONOSCOPY  05/2010   incomplete due to poor prep, hyperplastic rectal polyp   COLONOSCOPY  05/05/2002   Dimunitive polyps in the rectum and left colon, cold    biopsied/removed.  Scattered few left-sided diverticula.  Regular colonic   mucosa appeared normal   COLONOSCOPY N/A 05/28/2013   Rourk: mulitple tubular adenomas removed. next tcs 05/2016   COLONOSCOPY WITH PROPOFOL N/A 09/07/2016   Dr. Gala Romney: For hyperplastic polyps removed. Next colonoscopy March 2023 given history of adenomatous colon polyps in the past.   ESOPHAGOGASTRODUODENOSCOPY  08/2007   moderate sized hiatal hernia   ESOPHAGOGASTRODUODENOSCOPY  05/2010   noncritical schatzki ring s/p 107F   ESOPHAGOGASTRODUODENOSCOPY (EGD) WITH ESOPHAGEAL DILATION N/A 02/06/2013   YSA:YTKZSW esophagus-s/p dilation up to a 24 [place] size with [facility] dilators.  Hiatal hernia   ESOPHAGOGASTRODUODENOSCOPY (EGD) WITH PROPOFOL N/A 09/07/2016   Dr. Gala Romney: Normal, status post empiric dilation of the esophagus for history of dysphagia   ESOPHAGOGASTRODUODENOSCOPY (EGD) WITH PROPOFOL N/A 05/02/2019   Normal esophagus s/p dilation, normal stomach, normal duodenum   ESOPHAGOGASTRODUODENOSCOPY (EGD) WITH PROPOFOL N/A 01/15/2020   Procedure: ESOPHAGOGASTRODUODENOSCOPY (EGD) WITH PROPOFOL;  Surgeon: Daneil Dolin, MD;  [facility];  Service: Endoscopy;  Laterality: N/A;  12:45pm   EXTERNAL EAR SURGERY     bilateral   FOOT SURGERY     GLAUCOMA SURGERY     MALONEY DILATION N/A 09/07/2016  Procedure: MALONEY DILATION;  Surgeon: Daneil Dolin, MD;  [facility];  Service: Endoscopy;  Laterality: N/A;   MALONEY DILATION N/A 05/02/2019   Procedure: Venia Minks DILATION;  Surgeon: Daneil Dolin, MD;  [facility];  Service: Endoscopy;  Laterality: N/A;   MALONEY DILATION N/A 01/15/2020   Procedure: Venia Minks DILATION;  Surgeon: Daneil Dolin, MD;   [facility];  Service: Endoscopy;  Laterality: N/A;   POLYPECTOMY  09/07/2016   Procedure: POLYPECTOMY;  Surgeon: Daneil Dolin, MD;  [facility];  Service: Endoscopy;;  sigmoid colon x4   small bowel capsule  10/2007   normal    Current Outpatient Medications  Medication Sig Dispense Refill   atorvastatin (LIPITOR) 10 MG tablet TAKE 1 TABLET BY MOUTH ONCE A DAY. 30 tablet 5   benztropine (COGENTIN) 1 MG tablet Take 1 tablet (1 mg total) by mouth daily. 90 tablet 2   blood glucose meter kit and supplies Dispense based on patient and insurance preference. Use to test glucose once daily. (FOR ICD10, E11.9). 1 each 0   dicyclomine (BENTYL) 10 MG capsule May use one up to 3 times a day as needed for abdominal cramps (Patient taking differently: Take 10 mg by mouth 3 (three) times daily as needed (abdominal cramps.). ) 90 capsule 5   lamoTRIgine (LAMICTAL) 100 MG tablet Take 1 tablet (100 mg total) by mouth 2 (two) times daily. 180 tablet 2   paliperidone (INVEGA) 9 MG 24 hr tablet Take 1 tablet (9 mg total) by mouth daily. 90 tablet 2   polyethylene glycol (MIRALAX / GLYCOLAX) 17 g packet Take 17 g by mouth daily as needed (mild constipation.).     potassium chloride (KLOR-CON) 10 MEQ tablet TAKE 2 TABLETS BY MOUTH IN THE MORNING AND 1 TABLET IN THE EVENING. 90 tablet 0   propranolol (INDERAL) 10 MG tablet TAKE 1 TABLET BY MOUTH THREE TIMES A DAY. 90 tablet 0   RABEprazole (ACIPHEX) 20 MG tablet Take 1 tablet (20 mg total) by mouth 2 (two) times daily before a meal. 60 tablet 11   sertraline (ZOLOFT) 100 MG tablet Take 100 mg by mouth daily.     torsemide (DEMADEX) 20 MG tablet TAKE (1) TABLET BY MOUTH EACH MORNING. 30 tablet 5   traZODone (DESYREL) 100 MG tablet Take 200 mg by mouth at bedtime.     No current facility-administered medications for this visit.    Allergies as of 04/01/2020 - Review Complete 04/01/2020  Allergen Reaction Noted   Thorazine  [chlorpromazine hcl] Anaphylaxis 10/20/2010   Acetaminophen Other (See Comments)    Aspirin Other (See Comments)    Aspirin-acetaminophen-caffeine Other (See Comments)    Nsaids Nausea And Vomiting    Other  03/22/2019   Penicillins Nausea And Vomiting    Tomato Rash 01/17/2012    Family History  Problem Relation Age of Onset   Stroke Mother    Heart attack Father    Schizophrenia Other    Drug abuse Other    Alcohol abuse Other    Colon cancer Other        aunt   Obesity Other    COPD Other    GER disease Other    Diabetes type II Other    Anxiety disorder Other    Depression Other    Depression Sister    Schizophrenia Sister    Liver disease Neg Hx    Inflammatory bowel disease Neg Hx  Social History   Socioeconomic History   Marital status: Divorced    Spouse name: Not on file   Number of children: Not on file   Years of education: Not on file   Highest education level: High school graduate  Occupational History   Occupation: disabled    Employer: UNEMPLOYED  Tobacco Use   Smoking status: Current Every Day Smoker    Packs/day: 0.25    Years: 40.00    Pack years: 10.00    Types: Cigarettes   Smokeless tobacco: Never Used  Scientific laboratory technician Use: Never used  Substance and Sexual Activity   Alcohol use: No   Drug use: No   Sexual activity: Yes    Partners: Male    Birth control/protection: None    Comment: boyfriend  Other Topics Concern   Not on file  Social History Narrative   Not on file   Social Determinants of Health   Financial Resource Strain:    Difficulty of Paying Living Expenses: Not on file  Food Insecurity:    Worried About Charity fundraiser in the Last Year: Not on file   [employer] of Food in the Last Year: Not on file  Transportation Needs:    Lack of Transportation (Medical): Not on file   Lack of Transportation (Non-Medical): Not on file  Physical Activity:    Days of Exercise per  Week: Not on file   Minutes of Exercise per Session: Not on file  Stress:    Feeling of Stress : Not on file  Social Connections:    Frequency of Communication with Friends and Family: Not on file   Frequency of Social Gatherings with Friends and Family: Not on file   Attends Religious Services: Not on file   Active Member of Clubs or Organizations: Not on file   Attends Archivist Meetings: Not on file   Marital Status: Not on file    Review of Systems: Limited due to cognitive status   Physical Exam: BP (!) 105/103    Pulse 67    Temp 97.6 F (36.4 C) (Oral)    Ht _0  (1.753 m)    Wt 227 lb 3.2 oz (103.1 kg)    BMI 33.55 kg/m  General:   Alert and oriented. No distress noted. Fidgety. Difficult to keep focused.  Head:  Normocephalic and atraumatic. Eyes:  Conjuctiva clear without scleral icterus. Mouth:  Mask in place Abdomen:  +BS, soft, mildly TTP lower abdomen and non-distended. No rebound or guarding. No HSM or masses noted. Msk:  Shuffling gait. Cane for ambulation Extremities:  Without edema. Neurologic:  Alert and  oriented x4 Psych:  Alert and cooperative.  ASSESSMENT: ELLEE WAWRZYNIAK is a 59 y.o. female presenting today with history of GERD, IBS, dysphagia, colonoscopy due in 2023, with EGD done in interim from last visit due to dysphagia s/p dilation and findings of H.pylori.  Caretaker reports she was treated for H.pylori, but I am having difficulty finding what regimen. She needs documentation of eradication, but caretaker feels this will be difficult due to pills in pill pack, she is unable to go inside patient's home per policy, and she does not feel patient will be compliant NOT taking Aciphex for 2 weeks. We will try to revisit this at some point in the near future.  Dysphagia improved. Edentulous status likely playing a role. Difficult historian.  Reporting frequent soft, loose stool. Again, difficult to really obtain an adequate  history. She  is taking Bentyl. Although Miralax was on her list, she states she is not taking this. Will check stool studies if possible.  Vaginal bleeding reported today. Notes as intermittent in the past. Lower abdominal cramping. Referring to GYN.    PLAN:  Cdiff, GI pathogen panel  Continue Bentyl for now  Aciphex BID  Will check into status of H.pylori treatment  Manometry if persistent dysphagia  Referral to GYN due to  vaginal bleeding  Return in 4-6 weeks   Annitta Needs, PhD, ANP-BC [facility]

## 2020-04-01 NOTE — Patient Instructions (Signed)
Please complete the stool studies if you are able.  Continue Aciphex twice a day, 30 minutes before breakfast and dinner.  I am referring you to the gynecologist.  We will see you in 6-8 weeks!  I enjoyed seeing you again today! As you know, I value our relationship and want to provide genuine, compassionate, and quality care. I welcome your feedback. If you receive a survey regarding your visit,  I greatly appreciate you taking time to fill this out. See you next time!  Annitta Needs, PhD, ANP-BC [facility]

## 2020-04-05 DIAGNOSIS — M170 Bilateral primary osteoarthritis of knee: Secondary | ICD-10-CM | POA: Diagnosis not present

## 2020-04-07 ENCOUNTER — Telehealth: Payer: Self-pay

## 2020-04-07 DIAGNOSIS — R0902 Hypoxemia: Secondary | ICD-10-CM | POA: Diagnosis not present

## 2020-04-07 DIAGNOSIS — R5381 Other malaise: Secondary | ICD-10-CM | POA: Diagnosis not present

## 2020-04-07 DIAGNOSIS — R52 Pain, unspecified: Secondary | ICD-10-CM | POA: Diagnosis not present

## 2020-04-07 DIAGNOSIS — Z743 Need for continuous supervision: Secondary | ICD-10-CM | POA: Diagnosis not present

## 2020-04-07 NOTE — Telephone Encounter (Signed)
Please go ahead with this Also recommend physical therapy if not already doing this If this problem continues the patient needs to note that more than likely she will not be able to stay at her apartment

## 2020-04-07 NOTE — Telephone Encounter (Signed)
Patient has fallen several times and her caretaker is requesting a prescription for large wheelchair to be faxed into [place]

## 2020-04-08 NOTE — Telephone Encounter (Signed)
Script written for wheelchair. Will call and discuss message below after script is faxed.

## 2020-04-09 NOTE — Telephone Encounter (Signed)
Prescription for wheelchair faxed to pharmacy. Social worker notified. Per Education officer, museum Patient has had in home physical therapy several times and did have some recently but the last group [facility] would not continue services because when she doesn't take her meds or has a bad day she is hard to deal with and they felt it was a liability

## 2020-04-09 NOTE — Telephone Encounter (Signed)
I understand

## 2020-04-16 ENCOUNTER — Telehealth: Payer: Self-pay

## 2020-04-16 NOTE — Telephone Encounter (Signed)
Script faxed to pharmacy along with office notes. [name] is aware and verbalized understanding

## 2020-04-16 NOTE — Telephone Encounter (Signed)
[facility] and they have not received it. [facility] states that face to face office notes are also needed. Script is written out and awaiting signature if provider approves. Please advise. Thank you

## 2020-04-16 NOTE — Telephone Encounter (Signed)
[name] calling to check status of wheelchair being ordered. She states [facility] have script

## 2020-04-16 NOTE — Telephone Encounter (Signed)
I did sign the prescription I also documented within her August 11 note please send both to apothecary

## 2020-04-19 ENCOUNTER — Encounter: Payer: Self-pay | Admitting: Adult Health

## 2020-04-19 ENCOUNTER — Ambulatory Visit (INDEPENDENT_AMBULATORY_CARE_PROVIDER_SITE_OTHER): Payer: Medicare Other | Admitting: Adult Health

## 2020-04-19 VITALS — BP 105/73 | HR 80 | Ht 70.0 in | Wt 225.4 lb

## 2020-04-19 DIAGNOSIS — Z90710 Acquired absence of both cervix and uterus: Secondary | ICD-10-CM | POA: Diagnosis not present

## 2020-04-19 DIAGNOSIS — Z8742 Personal history of other diseases of the female genital tract: Secondary | ICD-10-CM | POA: Diagnosis not present

## 2020-04-19 LAB — POCT URINALYSIS DIPSTICK
Blood, UA: NEGATIVE
Glucose, UA: NEGATIVE
Leukocytes, UA: NEGATIVE
Nitrite, UA: NEGATIVE
Protein, UA: NEGATIVE

## 2020-04-19 NOTE — Progress Notes (Signed)
Patient ID: [name], female   DOB: 06-Oct-1960, 59 y.o.   MRN: [id number] History of Present Illness:  [name] is a 59 year old black female,single, sp hysterectomy in for complaints of vaginal bleeding twice now, and is black, no bleeding today. Her case worker brought her. PCP is Dr Sallee Lange.   Current Medications, Allergies, Past Medical History, Past Surgical History, Family History and Social History were reviewed in [employer] record.     Review of Systems: +vaginal bleeding when wipes it is dark and has happened twice now, not associated with anything she says Not currently having sex Reviewed past medical,surgical, social and family history. Reviewed medications and allergies.     Physical Exam:BP 105/73 (BP Location: Right Arm, Patient Position: Sitting, Cuff Size: Large)   Pulse 80   Ht 5\' 10"  (1.778 m)   Wt 225 lb 6.4 oz (102.2 kg)   BMI 32.34 kg/m   Urine dipstick was negative. General:  Well developed, well nourished, no acute distress Skin:  Warm and dry Neck:  Midline trachea, normal thyroid, good ROM, no lymphadenopathy Lungs; Clear to auscultation bilaterally Cardiovascular: Regular rate and rhythm Pelvic:  External genitalia is normal in appearance, no lesions.  The vagina is normal in appearance,white discharge, without any odor,no bleeding and no lesions seen . Urethra has no lesions or masses. The cervix and uterus are absent. No adnexal masses, mild tenderness noted.Bladder is non tender, no masses felt. Extremities/musculoskeletal:  No swelling or varicosities noted, no clubbing or cyanosis Psych:  No mood changes, alert and cooperative,seems happy AA is 0 Fall risk is high, pt assisted off of table and with dressing  PHQ 9 score is 14, no SI, she is on meds   [facility] - 04/19/20 1145      Pregnancy Intention Screening   Does the patient want to become pregnant in the next year? No    Does the patient's partner want to  become pregnant in the next year? No    Would the patient like to discuss contraceptive options today? No      Contraception Wrap Up   Current Method No Method - Other Reason   hyst   End Method No Method - Other Reason   hyst   Contraception Counseling Provided No         Examination chaperoned by Celene Squibb LPN  Impression and Plan: 1. History of vaginal bleeding Call if  bleeding reoccurs,will try to see then    2. S/P hysterectomy

## 2020-04-19 NOTE — Telephone Encounter (Signed)
Social worker calling back saying that [facility] saying they still do not have info on wheelchair.  Per chart, it was sent last Friday. ??????

## 2020-04-19 NOTE — Telephone Encounter (Signed)
Spoke with [facility] - they state they had the script for the wheelchair but need records faxed- Dr Nicki Reaper states to fax February 18, 2020 office note to [facility]-Thanks

## 2020-04-20 DIAGNOSIS — R296 Repeated falls: Secondary | ICD-10-CM | POA: Diagnosis not present

## 2020-04-20 DIAGNOSIS — M170 Bilateral primary osteoarthritis of knee: Secondary | ICD-10-CM | POA: Diagnosis not present

## 2020-05-10 ENCOUNTER — Telehealth: Payer: Self-pay

## 2020-05-10 NOTE — Telephone Encounter (Signed)
Patient may have an afternoon appointment sometime this week or next week  Or may have an 11:30 AM appointment next week  thank you

## 2020-05-10 NOTE — Telephone Encounter (Signed)
Patient's caregiver-[name] left message on answering machine stating patient is having pains all over her body. She has appointment on 11/22 for follow up please advise on this issue. No available appointments it would be a work in.

## 2020-05-11 NOTE — Telephone Encounter (Signed)
Has appointment next week

## 2020-05-19 ENCOUNTER — Ambulatory Visit: Payer: Medicare Other | Admitting: Family Medicine

## 2020-05-21 DIAGNOSIS — R296 Repeated falls: Secondary | ICD-10-CM | POA: Diagnosis not present

## 2020-05-21 DIAGNOSIS — M170 Bilateral primary osteoarthritis of knee: Secondary | ICD-10-CM | POA: Diagnosis not present

## 2020-05-26 ENCOUNTER — Other Ambulatory Visit: Payer: Self-pay

## 2020-05-26 ENCOUNTER — Encounter (HOSPITAL_COMMUNITY): Payer: Self-pay | Admitting: Emergency Medicine

## 2020-05-26 ENCOUNTER — Emergency Department (HOSPITAL_COMMUNITY): Payer: Medicare Other

## 2020-05-26 ENCOUNTER — Emergency Department (HOSPITAL_COMMUNITY)
Admission: EM | Admit: 2020-05-26 | Discharge: 2020-05-27 | Disposition: A | Discharge status: Home / Self Care | Payer: Medicare Other | Attending: Emergency Medicine | Admitting: Emergency Medicine

## 2020-05-26 DIAGNOSIS — W19XXXA Unspecified fall, initial encounter: Secondary | ICD-10-CM | POA: Diagnosis not present

## 2020-05-26 DIAGNOSIS — Z743 Need for continuous supervision: Secondary | ICD-10-CM | POA: Diagnosis not present

## 2020-05-26 DIAGNOSIS — Z043 Encounter for examination and observation following other accident: Secondary | ICD-10-CM | POA: Diagnosis not present

## 2020-05-26 DIAGNOSIS — W01198A Fall on same level from slipping, tripping and stumbling with subsequent striking against other object, initial encounter: Secondary | ICD-10-CM | POA: Insufficient documentation

## 2020-05-26 DIAGNOSIS — E1165 Type 2 diabetes mellitus with hyperglycemia: Secondary | ICD-10-CM | POA: Insufficient documentation

## 2020-05-26 DIAGNOSIS — I10 Essential (primary) hypertension: Secondary | ICD-10-CM | POA: Diagnosis not present

## 2020-05-26 DIAGNOSIS — M546 Pain in thoracic spine: Secondary | ICD-10-CM | POA: Diagnosis not present

## 2020-05-26 DIAGNOSIS — R519 Headache, unspecified: Secondary | ICD-10-CM | POA: Insufficient documentation

## 2020-05-26 DIAGNOSIS — E1169 Type 2 diabetes mellitus with other specified complication: Secondary | ICD-10-CM | POA: Diagnosis not present

## 2020-05-26 DIAGNOSIS — E785 Hyperlipidemia, unspecified: Secondary | ICD-10-CM | POA: Diagnosis not present

## 2020-05-26 DIAGNOSIS — S0990XA Unspecified injury of head, initial encounter: Secondary | ICD-10-CM | POA: Diagnosis not present

## 2020-05-26 DIAGNOSIS — F17210 Nicotine dependence, cigarettes, uncomplicated: Secondary | ICD-10-CM | POA: Diagnosis not present

## 2020-05-26 DIAGNOSIS — Z86010 Personal history of colonic polyps: Secondary | ICD-10-CM | POA: Insufficient documentation

## 2020-05-26 DIAGNOSIS — Z79899 Other long term (current) drug therapy: Secondary | ICD-10-CM | POA: Diagnosis not present

## 2020-05-26 LAB — URINALYSIS, ROUTINE W REFLEX MICROSCOPIC
Bilirubin Urine: NEGATIVE
Glucose, UA: NEGATIVE mg/dL
Ketones, ur: NEGATIVE mg/dL
Leukocytes,Ua: NEGATIVE
Nitrite: NEGATIVE
Protein, ur: NEGATIVE mg/dL
Specific Gravity, Urine: 1.003 — ABNORMAL LOW (ref 1.005–1.030)
pH: 6 (ref 5.0–8.0)

## 2020-05-26 MED ORDER — OXYCODONE HCL 5 MG PO TABS
5.0000 mg | ORAL_TABLET | Freq: Once | ORAL | Status: AC
  Administered 2020-05-26: 5 mg via ORAL
  Filled 2020-05-26: qty 1

## 2020-05-26 NOTE — ED Triage Notes (Signed)
Pt reports slipping and hitting the back of her head on cement on Monday 05/24/2020. Pt also reports thoracic back pain.

## 2020-05-27 DIAGNOSIS — S0990XA Unspecified injury of head, initial encounter: Secondary | ICD-10-CM | POA: Diagnosis not present

## 2020-05-27 DIAGNOSIS — Z043 Encounter for examination and observation following other accident: Secondary | ICD-10-CM | POA: Diagnosis not present

## 2020-05-27 NOTE — ED Provider Notes (Signed)
[facility] EMERGENCY DEPARTMENT Provider Note   CSN: [id number] Arrival date & time: 05/26/20  1712     History Chief Complaint  Patient presents with  . Fall    [name] is a 59 y.o. female.  Had a 'fall' the other day where she slid down and hit her head. Persistent headache since then. Has back pain but her and friend state this has been ongoing for months. States she is onblood thinners but doesn't know why or for what.    Fall This is a recurrent problem. The current episode started more than 2 days ago. The problem occurs every several days. The problem has not changed since onset.Associated symptoms include headaches. Pertinent negatives include no chest pain, no abdominal pain and no shortness of breath. Nothing aggravates the symptoms. Nothing relieves the symptoms. She has tried nothing for the symptoms. The treatment provided no relief.       Past Medical History:  Diagnosis Date  . Anxiety   . Diabetes mellitus   . Diabetes mellitus, type II ([place])   . HTN (hypertension)   . Hyperglycemia   . IBS (irritable bowel syndrome)   . Lung nodules    right, followed by PCP, PET 11/2011  . Mental retardation   . MI (myocardial infarction) ([place])   . Migraines   . Sleep apnea     Patient Active Problem List   Diagnosis Date Noted  . S/P hysterectomy 04/19/2020  . History of vaginal bleeding 04/19/2020  . Fall in bathtub   . Neck pain   . Primary osteoarthritis of both knees 01/27/2019  . Hyperlipidemia 09/26/2017  . Dysphagia 08/15/2016  . Obstructive sleep apnea syndrome 06/27/2016  . Rectal bleeding 04/07/2013  . Melena 03/04/2013  . Hematemesis 03/04/2013  . Abdominal pain, epigastric 03/04/2013  . Hypokalemia 03/04/2013  . Smoker 01/16/2012  . Diarrhea 12/26/2011  . Pulmonary nodule, right 12/26/2011  . Abdominal pain 10/08/2011  . Fatty liver 08/24/2011  . Constipation 10/20/2010  . NAUSEA AND VOMITING 06/09/2010  . HEMATOCHEZIA 04/21/2010    . OTHER DYSPHAGIA 04/21/2010  . MILD MENTAL RETARDATION 02/17/2009  . GLAUCOMA 02/17/2009  . Morbid obesity ([place]) 01/15/2009  . ANXIETY 01/15/2009  . MIGRAINE HEADACHE 01/15/2009  . Essential hypertension 01/15/2009  . HEMORRHOIDS 01/15/2009  . GASTROESOPHAGEAL REFLUX DISEASE, CHRONIC 01/15/2009  . CONSTIPATION, CHRONIC 01/15/2009  . IBS (irritable bowel syndrome) 01/15/2009  . KNEE PAIN, CHRONIC 01/15/2009  . BACK PAIN, CHRONIC 01/15/2009  . Hx of adenomatous colonic polyps 01/15/2009    Past Surgical History:  Procedure Laterality Date  . ABDOMINAL HYSTERECTOMY    . BIOPSY  01/15/2020   Procedure: BIOPSY;  Surgeon: Daneil Dolin, MD;  Location: [facility];  Service: Endoscopy;;  gastric  . COLONOSCOPY  11/2007   hyperplastic polyps, prior hx of adenomas   . COLONOSCOPY  05/2010   incomplete due to poor prep, hyperplastic rectal polyp  . COLONOSCOPY  05/05/2002   Dimunitive polyps in the rectum and left colon, cold    biopsied/removed.  Scattered few left-sided diverticula.  Regular colonic   mucosa appeared normal  . COLONOSCOPY N/A 05/28/2013   Rourk: mulitple tubular adenomas removed. next tcs 05/2016  . COLONOSCOPY WITH PROPOFOL N/A 09/07/2016   Dr. Gala Romney: For hyperplastic polyps removed. Next colonoscopy March 2023 given history of adenomatous colon polyps in the past.  . ESOPHAGOGASTRODUODENOSCOPY  08/2007   moderate sized hiatal hernia  . ESOPHAGOGASTRODUODENOSCOPY  05/2010   noncritical schatzki ring  s/p 74F  . ESOPHAGOGASTRODUODENOSCOPY (EGD) WITH ESOPHAGEAL DILATION N/A 02/06/2013   GYF:VCBSWH esophagus-s/p dilation up to a 56 [place] size with [facility] dilators.  Hiatal hernia  . ESOPHAGOGASTRODUODENOSCOPY (EGD) WITH PROPOFOL N/A 09/07/2016   Dr. Gala Romney: Normal, status post empiric dilation of the esophagus for history of dysphagia  . ESOPHAGOGASTRODUODENOSCOPY (EGD) WITH PROPOFOL N/A 05/02/2019   Normal esophagus s/p dilation, normal stomach, normal duodenum  .  ESOPHAGOGASTRODUODENOSCOPY (EGD) WITH PROPOFOL N/A 01/15/2020   normal esophagus s/p dilation, gastric nodule s/p biopsy. This showed reactive gastropathy with H.pylori.   Marland Kitchen EXTERNAL EAR SURGERY     bilateral  . FOOT SURGERY    . GLAUCOMA SURGERY    . MALONEY DILATION N/A 09/07/2016   Procedure: Venia Minks DILATION;  Surgeon: Daneil Dolin, MD;  [facility];  Service: Endoscopy;  Laterality: N/A;  . Venia Minks DILATION N/A 05/02/2019   Procedure: Venia Minks DILATION;  Surgeon: Daneil Dolin, MD;  [facility];  Service: Endoscopy;  Laterality: N/A;  . Venia Minks DILATION N/A 01/15/2020   Procedure: Venia Minks DILATION;  Surgeon: Daneil Dolin, MD;  [facility];  Service: Endoscopy;  Laterality: N/A;  . POLYPECTOMY  09/07/2016   Procedure: POLYPECTOMY;  Surgeon: Daneil Dolin, MD;  [facility];  Service: Endoscopy;;  sigmoid colon x4  . small bowel capsule  10/2007   normal     OB History    Gravida  3   Para      Term      Preterm      AB  2   Living  0     SAB      TAB      Ectopic  1   Multiple      Live Births              Family History  Problem Relation Age of Onset  . Stroke Mother   . Heart attack Father   . Schizophrenia Other   . Drug abuse Other   . Alcohol abuse Other   . Colon cancer Other        aunt  . Obesity Other   . COPD Other   . Anxiety disorder Other   . GER disease Other   . Diabetes type II Other   . Anxiety disorder Other   . Depression Other   . Depression Sister   . Schizophrenia Sister   . Liver disease Neg Hx   . Inflammatory bowel disease Neg Hx     Social History   Tobacco Use  . Smoking status: Current Every Day Smoker    Packs/day: 0.25    Years: 40.00    Pack years: 10.00    Types: Cigarettes  . Smokeless tobacco: Never Used  Vaping Use  . Vaping Use: Never used  Substance Use Topics  . Alcohol use: No  . Drug use: No    Home Medications Prior to Admission medications     Medication Sig Start Date End Date Taking? Authorizing Provider  atorvastatin (LIPITOR) 10 MG tablet TAKE 1 TABLET BY MOUTH ONCE A DAY. 01/15/20   Kathyrn Drown, MD  benztropine (COGENTIN) 1 MG tablet Take 1 tablet (1 mg total) by mouth daily. 08/26/19   Cloria Spring, MD  blood glucose meter kit and supplies Dispense based on patient and insurance preference. Use to test glucose once daily. (FOR ICD10, E11.9). Patient not taking: Reported on 04/19/2020 03/24/20   Luking,  Elayne Snare, MD  dicyclomine (BENTYL) 10 MG capsule May use one up to 3 times a day as needed for abdominal cramps Patient taking differently: Take 10 mg by mouth 3 (three) times daily as needed (abdominal cramps.).  12/31/19   Annitta Needs, NP  lamoTRIgine (LAMICTAL) 100 MG tablet Take 1 tablet (100 mg total) by mouth 2 (two) times daily. 08/26/19   Cloria Spring, MD  paliperidone (INVEGA) 9 MG 24 hr tablet Take 1 tablet (9 mg total) by mouth daily. 08/26/19   Cloria Spring, MD  potassium chloride (KLOR-CON) 10 MEQ tablet TAKE 2 TABLETS BY MOUTH IN THE MORNING AND 1 TABLET IN THE EVENING. 03/16/20   Kathyrn Drown, MD  propranolol (INDERAL) 10 MG tablet TAKE 1 TABLET BY MOUTH THREE TIMES A DAY. 03/16/20   Kathyrn Drown, MD  RABEprazole (ACIPHEX) 20 MG tablet Take 1 tablet (20 mg total) by mouth 2 (two) times daily before a meal. 11/18/19   Annitta Needs, NP  sertraline (ZOLOFT) 100 MG tablet Take 100 mg by mouth daily.    [provider]  torsemide (DEMADEX) 20 MG tablet TAKE (1) TABLET BY MOUTH EACH MORNING. Patient not taking: Reported on 04/19/2020 01/15/20   Kathyrn Drown, MD  traZODone (DESYREL) 100 MG tablet Take 200 mg by mouth at bedtime.    [provider]    Allergies    Thorazine [chlorpromazine hcl], Acetaminophen, Aspirin, Aspirin-acetaminophen-caffeine, Nsaids, Other, Penicillins, and Tomato  Review of Systems   Review of Systems  Respiratory: Negative for shortness of breath.   Cardiovascular:  Negative for chest pain.  Gastrointestinal: Negative for abdominal pain.  Neurological: Positive for headaches.  All other systems reviewed and are negative.   Physical Exam Updated Vital Signs BP (!) 113/100 (BP Location: Left Arm)   Pulse 70   Temp 97.7 F (36.5 C) (Oral)   Resp 18   SpO2 93%   Physical Exam Vitals and nursing note reviewed.  Constitutional:      Appearance: She is well-developed.  HENT:     Head: Normocephalic and atraumatic.     Nose: No congestion or rhinorrhea.     Mouth/Throat:     Mouth: Mucous membranes are moist.     Pharynx: Oropharynx is clear.  Eyes:     Pupils: Pupils are equal, round, and reactive to light.  Cardiovascular:     Rate and Rhythm: Normal rate and regular rhythm.  Pulmonary:     Effort: No respiratory distress.     Breath sounds: No stridor.  Abdominal:     General: There is no distension.     Palpations: Abdomen is soft.  Musculoskeletal:        General: No swelling or tenderness. Normal range of motion.     Cervical back: Normal range of motion.  Skin:    General: Skin is warm and dry.  Neurological:     General: No focal deficit present.     Mental Status: She is alert.     ED Results / Procedures / Treatments   Labs (all labs ordered are listed, but only abnormal results are displayed) Labs Reviewed  URINALYSIS, ROUTINE W REFLEX MICROSCOPIC - Abnormal; Notable for the following components:      Result Value   APPearance CLOUDY (*)    Specific Gravity, Urine 1.003 (*)    Hgb urine dipstick MODERATE (*)    Bacteria, UA RARE (*)    All other components within normal  limits  URINE CULTURE    EKG None  Radiology No results found.       Procedures Procedures (including critical care time)  Medications Ordered in ED Medications  oxyCODONE (Oxy IR/ROXICODONE) immediate release tablet 5 mg (5 mg Oral Given 05/26/20 2338)    ED Course  I have reviewed the triage vital signs and the nursing  notes.  Pertinent labs & imaging results that were available during my care of the patient were reviewed by me and considered in my medical decision making (see chart for details).  Clinical Course as of May 27 42  Wed May 26, 2020  2302 Squamous Epithelial / LPF: 11-20 [JM]  2302 Appears to likely be contaminated, will add on culture. Treat if symptomatic in any way.   Urinalysis, Routine w reflex microscopic Urine, Clean Catch(!) [JM]    Clinical Course User Index [JM] Jaleesa Cervi, Corene Cornea, MD   MDM Rules/Calculators/A&P                          Not symptomatic from urine standpoint, culture added on. Head ct negative per radiology read, can't view personally at this time 2/2 IT issue. Will fu w/ PCP for her more chronic complaints.   Final Clinical Impression(s) / ED Diagnoses Final diagnoses:  Fall, initial encounter    Rx / DC Orders ED Discharge Orders    None       Jonta Gastineau, Corene Cornea, MD 05/27/20 0045

## 2020-05-28 LAB — URINE CULTURE: Culture: <10000 — AB

## 2020-05-31 ENCOUNTER — Encounter: Payer: Self-pay | Admitting: Family Medicine

## 2020-05-31 ENCOUNTER — Ambulatory Visit (INDEPENDENT_AMBULATORY_CARE_PROVIDER_SITE_OTHER): Payer: Medicare Other | Admitting: Family Medicine

## 2020-05-31 ENCOUNTER — Other Ambulatory Visit: Payer: Self-pay

## 2020-05-31 VITALS — BP 112/70 | HR 87 | Temp 98.2°F | Ht 70.0 in | Wt 215.0 lb

## 2020-05-31 DIAGNOSIS — I10 Essential (primary) hypertension: Secondary | ICD-10-CM | POA: Diagnosis not present

## 2020-05-31 DIAGNOSIS — E785 Hyperlipidemia, unspecified: Secondary | ICD-10-CM | POA: Diagnosis not present

## 2020-05-31 DIAGNOSIS — E119 Type 2 diabetes mellitus without complications: Secondary | ICD-10-CM

## 2020-05-31 DIAGNOSIS — M6281 Muscle weakness (generalized): Secondary | ICD-10-CM | POA: Diagnosis not present

## 2020-05-31 DIAGNOSIS — Z23 Encounter for immunization: Secondary | ICD-10-CM

## 2020-05-31 DIAGNOSIS — F419 Anxiety disorder, unspecified: Secondary | ICD-10-CM

## 2020-05-31 NOTE — Progress Notes (Signed)
Subjective:    Patient ID: [name], female    DOB: [birth year], 59 y.o.   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  Very nice patient but very challenging She lives in an apartment she has a helper who helps her out she has social services to help her. She tends to have a lot of various symptoms that she brings up and not all of them always add up to what is true within the medical record. Pt states her nerves are acting up.  She finds her self getting stressed a lot.  She is under the care of psychiatry.  We did talk about trying to stay busy with things to help her keep her mind off of her health Pain in Back, knees, legs, and feet. States pain has been going on for a long time. Takes advil.  She relates a lot of joint pain back pain aching.  I recommend gentle stretching recommend working hard on diet to try to keep her weight under control Tylenol may be used Advil when necessary  Feet burning a lot. Wonders if she has neuropathy.  It is hard to know if she has neuropathy she relates a lot of burning in her feet along with some numbness and tingling.  She does have underlying diabetic history.  Still having trouble with dizziness.  Intermittently she finds her self feeling woozy and sometimes falls over and has a hard time getting up.  She lives by herself.  She has mental health illness but under the current guidelines it is fine for the patient to be by herself.  This is challenging  Would like a flu vaccine.    Review of Systems  Constitutional: Negative for activity change, appetite change and fatigue.  HENT: Negative for congestion and rhinorrhea.   Respiratory: Negative for cough and shortness of breath.   Cardiovascular: Negative for chest pain and leg swelling.  Gastrointestinal: Negative for abdominal pain and diarrhea.  Endocrine: Negative for polydipsia and polyphagia.  Musculoskeletal: Positive for arthralgias, back pain and gait problem.  Skin:  Negative for color change.  Neurological: Positive for numbness. Negative for dizziness and weakness.  Psychiatric/Behavioral: Negative for behavioral problems and confusion.       Objective:   Physical Exam Vitals reviewed.  Constitutional:      General: She is not in acute distress. HENT:     Head: Normocephalic and atraumatic.  Eyes:     General:        Right eye: No discharge.        Left eye: No discharge.  Neck:     Trachea: No tracheal deviation.  Cardiovascular:     Rate and Rhythm: Normal rate and regular rhythm.     Heart sounds: Normal heart sounds. No murmur heard.   Pulmonary:     Effort: Pulmonary effort is normal. No respiratory distress.     Breath sounds: Normal breath sounds.  Lymphadenopathy:     Cervical: No cervical adenopathy.  Skin:    General: Skin is warm and dry.  Neurological:     Mental Status: She is alert.     Coordination: Coordination normal.  Psychiatric:        Behavior: Behavior normal.    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 Essential hypertension Blood pressure decent control  continue current measures - Basic metabolic panel  2. Diabetes mellitus without complication ([place]) Patient has diet-controlled diabetes continue healthy diet staying active check A1c - Hemoglobin A1c - Microalbumin / creatinine urine ratio  3. Hyperlipidemia, unspecified hyperlipidemia type Hyperlipidemia check labs to see if we need to increase his statin. - Lipid panel  4. Muscle weakness She does not utilize her muscles enough therefore she has a lot of muscle weakness  5. Morbid obesity ([place]) Significant morbid obesity associated with diabetes hyperlipidemia and mental health issues.  Watch portions stay active  6. Need for vaccination Flu shot today - Flu Vaccine QUAD 6+ mos PF IM (Fluarix Quad PF)  7. Anxiety Follow  through with psychiatry  Diabetic shoes are recommended see foot exam above

## 2020-06-02 ENCOUNTER — Other Ambulatory Visit: Payer: Self-pay | Admitting: *Deleted

## 2020-06-02 ENCOUNTER — Ambulatory Visit: Payer: Medicare Other | Admitting: Gastroenterology

## 2020-06-02 DIAGNOSIS — F419 Anxiety disorder, unspecified: Secondary | ICD-10-CM

## 2020-06-17 ENCOUNTER — Other Ambulatory Visit: Payer: Self-pay | Admitting: Family Medicine

## 2020-06-18 NOTE — Telephone Encounter (Signed)
6 months please

## 2020-06-20 DIAGNOSIS — M170 Bilateral primary osteoarthritis of knee: Secondary | ICD-10-CM | POA: Diagnosis not present

## 2020-06-20 DIAGNOSIS — R296 Repeated falls: Secondary | ICD-10-CM | POA: Diagnosis not present

## 2020-07-13 ENCOUNTER — Telehealth: Payer: Self-pay

## 2020-07-13 NOTE — Telephone Encounter (Signed)
Pt needs to request blood work last visit Dr Nicki Reaper talked about getting blood work first of the year Education officer, museum stopped by to pick up paperwork.  [place] Worker- [phone]

## 2020-07-13 NOTE — Telephone Encounter (Signed)
Bw orders are active in system and I notified jennifer that she can go over to lab any day to have drawn.

## 2020-07-16 DIAGNOSIS — E119 Type 2 diabetes mellitus without complications: Secondary | ICD-10-CM | POA: Diagnosis not present

## 2020-07-16 DIAGNOSIS — I10 Essential (primary) hypertension: Secondary | ICD-10-CM | POA: Diagnosis not present

## 2020-07-16 DIAGNOSIS — E785 Hyperlipidemia, unspecified: Secondary | ICD-10-CM | POA: Diagnosis not present

## 2020-07-17 LAB — MICROALBUMIN / CREATININE URINE RATIO
Creatinine, Urine: 33.7 mg/dL
Microalb/Creat Ratio: <9 mg/g creat (ref 0–29)
Microalbumin, Urine: <3 ug/mL

## 2020-07-17 LAB — LIPID PANEL
Chol/HDL Ratio: 2 ratio (ref 0.0–4.4)
Cholesterol, Total: 115 mg/dL (ref 100–199)
HDL: 57 mg/dL (ref >39)
LDL Chol Calc (NIH): 42 mg/dL (ref 0–99)
Triglycerides: 80 mg/dL (ref 0–149)
VLDL Cholesterol Cal: 16 mg/dL (ref 5–40)

## 2020-07-17 LAB — BASIC METABOLIC PANEL
BUN/Creatinine Ratio: 5 — ABNORMAL LOW (ref 9–23)
BUN: 4 mg/dL — ABNORMAL LOW (ref 6–24)
CO2: 31 mmol/L — ABNORMAL HIGH (ref 20–29)
Calcium: 10.1 mg/dL (ref 8.7–10.2)
Chloride: 90 mmol/L — ABNORMAL LOW (ref 96–106)
Creatinine, Ser: 0.82 mg/dL (ref 0.57–1.00)
GFR calc Af Amer: 92 mL/min/{1.73_m2} (ref >59)
GFR calc non Af Amer: 80 mL/min/{1.73_m2} (ref >59)
Glucose: 102 mg/dL — ABNORMAL HIGH (ref 65–99)
Potassium: 3.3 mmol/L — ABNORMAL LOW (ref 3.5–5.2)
Sodium: 137 mmol/L (ref 134–144)

## 2020-07-17 LAB — HEMOGLOBIN A1C
Est. average glucose Bld gHb Est-mCnc: 140 mg/dL
Hgb A1c MFr Bld: 6.5 % — ABNORMAL HIGH (ref 4.8–5.6)

## 2020-07-20 ENCOUNTER — Telehealth: Payer: Self-pay

## 2020-07-20 MED ORDER — POTASSIUM CHLORIDE ER 10 MEQ PO TBCR: 20.0000 meq | EXTENDED_RELEASE_TABLET | Freq: Two times a day (BID) | ORAL | 5 refills | Status: DC

## 2020-07-20 NOTE — Telephone Encounter (Signed)
Pt is taking potassium chloride (KLOR-CON) 10 MEQ tablet  Is taking two in the morning and one in the evening. She is thinking they want to change that to 2 in the evening. [facility] - [place], [place] - [place]   Pt call back [phone]      ext  [phone]

## 2020-07-20 NOTE — Telephone Encounter (Signed)
Prescription sent electronically to pharmacy. Social Worker Engineer, civil (consulting) notified and verbalized understanding- she stated she is heading to see patient and will reminder her of change.

## 2020-07-20 NOTE — Telephone Encounter (Signed)
Nurses I would recommend 2 in the morning 2 in the evening, please have the pharmacy do up her prescription this way may have 6 months

## 2020-07-21 DIAGNOSIS — R296 Repeated falls: Secondary | ICD-10-CM | POA: Diagnosis not present

## 2020-07-21 DIAGNOSIS — M170 Bilateral primary osteoarthritis of knee: Secondary | ICD-10-CM | POA: Diagnosis not present

## 2020-07-23 ENCOUNTER — Ambulatory Visit (INDEPENDENT_AMBULATORY_CARE_PROVIDER_SITE_OTHER): Payer: Medicare Other | Admitting: Gastroenterology

## 2020-07-23 ENCOUNTER — Other Ambulatory Visit: Payer: Self-pay

## 2020-07-23 ENCOUNTER — Encounter: Payer: Self-pay | Admitting: Gastroenterology

## 2020-07-23 ENCOUNTER — Telehealth: Payer: Self-pay | Admitting: Gastroenterology

## 2020-07-23 VITALS — BP 127/82 | HR 100 | Temp 96.8°F | Ht 70.0 in | Wt 209.2 lb

## 2020-07-23 DIAGNOSIS — K219 Gastro-esophageal reflux disease without esophagitis: Secondary | ICD-10-CM

## 2020-07-23 DIAGNOSIS — Z8619 Personal history of other infectious and parasitic diseases: Secondary | ICD-10-CM | POA: Diagnosis not present

## 2020-07-23 DIAGNOSIS — R1310 Dysphagia, unspecified: Secondary | ICD-10-CM | POA: Diagnosis not present

## 2020-07-23 MED ORDER — DICYCLOMINE HCL 10 MG PO CAPS: 10.0000 mg | ORAL_CAPSULE | Freq: Three times a day (TID) | ORAL | 5 refills | Status: DC

## 2020-07-23 MED ORDER — RABEPRAZOLE SODIUM 20 MG PO TBEC: 20.0000 mg | DELAYED_RELEASE_TABLET | Freq: Two times a day (BID) | ORAL | 11 refills | Status: DC

## 2020-07-23 NOTE — Telephone Encounter (Signed)
History of H.pylori: unclear if she completed treatment appropriately due to cognitive deficits. Discussed with [facility]. They will send pill pack out without Aciphex for 07/29/20 through 2/3. Urea breath test 2/3. I contacted Foye Spurling, patient's sponsor, and left voicemail regarding this after visit. Resume Aciphex 2/4.   Can we follow-up with [name] and make sure that she knows to have the urea breath test done on the morning of 2/3? Thanks!

## 2020-07-23 NOTE — Progress Notes (Signed)
Referring Provider: Kathyrn Drown, MD Primary Care Physician:  Kathyrn Drown, MD Primary GI: Dr. Gala Romney    Chief Complaint  Patient presents with  . Diarrhea    Few days ago  . Dysphagia    Sometimes meds/food come back up.     HPI:   [name] is a 60 y.o. female presenting today with a history of GERD, IBS,dysphagia,colonoscopy due in 2023 (history of adenomas). EGD with empiric dilation completed Oct 2020.Dysphagiarecurrent, with BPE updated Jan 2021 withrelative narrowing of GE junction, temporarily obstructed 12.5 mm tablet but passed following multiple swallows of water and thin barium. Esophageal dysmotility. Early interval EGD July 2021 with normal esophagus s/p dilation, gastric nodule s/p biopsy. This showed reactive gastropathy with H.pylori.   [place] H.pylori eradication documentation in the future. Caretaker has felt this would be difficult as she is unable to monitor her not taking PPI for 2 weeks and afraid patient will not comply. However, we discussed contacting pharmacy and asking them to withhold from pack .  Beef and chicken will sometimes hang. Other foods and liquids not a problem. Continues on Aciphex BID. Occasional abdominal cramping and more frequent stools. Thinks she ran out of Bentyl. No rectal bleeding. Does not want to pursue manometry. Feels she won't be able to do this.    Past Medical History:  Diagnosis Date  . Anxiety   . Diabetes mellitus   . Diabetes mellitus, type II ([place])   . HTN (hypertension)   . Hyperglycemia   . IBS (irritable bowel syndrome)   . Lung nodules    right, followed by PCP, PET 11/2011  . Mental retardation   . MI (myocardial infarction) ([place])   . Migraines   . Sleep apnea     Past Surgical History:  Procedure Laterality Date  . ABDOMINAL HYSTERECTOMY    . BIOPSY  01/15/2020   Procedure: BIOPSY;  Surgeon: Daneil Dolin, MD;  [facility];  Service: Endoscopy;;  gastric  . COLONOSCOPY   11/2007   hyperplastic polyps, prior hx of adenomas   . COLONOSCOPY  05/2010   incomplete due to poor prep, hyperplastic rectal polyp  . COLONOSCOPY  05/05/2002   Dimunitive polyps in the rectum and left colon, cold    biopsied/removed.  Scattered few left-sided diverticula.  Regular colonic   mucosa appeared normal  . COLONOSCOPY N/A 05/28/2013   [place]k: mulitple tubular adenomas removed. next tcs 05/2016  . COLONOSCOPY WITH PROPOFOL N/A 09/07/2016   Dr. Gala Romney: For hyperplastic polyps removed. Next colonoscopy March 2023 given history of adenomatous colon polyps in the past.  . ESOPHAGOGASTRODUODENOSCOPY  08/2007   moderate sized hiatal hernia  . ESOPHAGOGASTRODUODENOSCOPY  05/2010   noncritical schatzki ring s/p 70F  . ESOPHAGOGASTRODUODENOSCOPY (EGD) WITH ESOPHAGEAL DILATION N/A 02/06/2013   VEH:MCNOBS esophagus-s/p dilation up to a 17 [place] size with [facility] dilators.  Hiatal hernia  . ESOPHAGOGASTRODUODENOSCOPY (EGD) WITH PROPOFOL N/A 09/07/2016   Dr. Gala Romney: Normal, status post empiric dilation of the esophagus for history of dysphagia  . ESOPHAGOGASTRODUODENOSCOPY (EGD) WITH PROPOFOL N/A 05/02/2019   Normal esophagus s/p dilation, normal stomach, normal duodenum  . ESOPHAGOGASTRODUODENOSCOPY (EGD) WITH PROPOFOL N/A 01/15/2020   normal esophagus s/p dilation, gastric nodule s/p biopsy. This showed reactive gastropathy with H.pylori.   Marland Kitchen EXTERNAL EAR SURGERY     bilateral  . FOOT SURGERY    . GLAUCOMA SURGERY    . MALONEY DILATION N/A 09/07/2016   Procedure:  MALONEY DILATION;  Surgeon: Daneil Dolin, MD;  [facility];  Service: Endoscopy;  Laterality: N/A;  . Venia Minks DILATION N/A 05/02/2019   Procedure: Venia Minks DILATION;  Surgeon: Daneil Dolin, MD;  [facility];  Service: Endoscopy;  Laterality: N/A;  . Venia Minks DILATION N/A 01/15/2020   Procedure: Venia Minks DILATION;  Surgeon: Daneil Dolin, MD;  [facility];  Service: Endoscopy;  Laterality: N/A;  .  POLYPECTOMY  09/07/2016   Procedure: POLYPECTOMY;  Surgeon: Daneil Dolin, MD;  [facility];  Service: Endoscopy;;  sigmoid colon x4  . small bowel capsule  10/2007   normal    Current Outpatient Medications  Medication Sig Dispense Refill  . atorvastatin (LIPITOR) 10 MG tablet TAKE 1 TABLET BY MOUTH ONCE A DAY. 30 tablet 5  . benztropine (COGENTIN) 1 MG tablet Take 1 tablet (1 mg total) by mouth daily. 90 tablet 2  . blood glucose meter kit and supplies Dispense based on patient and insurance preference. Use to test glucose once daily. (FOR ICD10, E11.9). 1 each 0  . lamoTRIgine (LAMICTAL) 100 MG tablet Take 1 tablet (100 mg total) by mouth 2 (two) times daily. 180 tablet 2  . paliperidone (INVEGA) 9 MG 24 hr tablet Take 1 tablet (9 mg total) by mouth daily. 90 tablet 2  . potassium chloride (KLOR-CON) 10 MEQ tablet Take 2 tablets (20 mEq total) by mouth 2 (two) times daily. with food 180 tablet 5  . propranolol (INDERAL) 10 MG tablet TAKE 1 TABLET BY MOUTH THREE TIMES A DAY. 90 tablet 5  . RABEprazole (ACIPHEX) 20 MG tablet Take 1 tablet (20 mg total) by mouth 2 (two) times daily before a meal. 60 tablet 11  . sertraline (ZOLOFT) 100 MG tablet Take 100 mg by mouth daily.    Marland Kitchen torsemide (DEMADEX) 20 MG tablet TAKE (1) TABLET BY MOUTH EACH MORNING. 30 tablet 5  . traZODone (DESYREL) 100 MG tablet Take 200 mg by mouth at bedtime.    . dicyclomine (BENTYL) 10 MG capsule Take 1 capsule (10 mg total) by mouth 3 (three) times daily before meals. As needed for loose stool and belly cramping. 90 capsule 5   No current facility-administered medications for this visit.    Allergies as of 07/23/2020 - Review Complete 07/23/2020  Allergen Reaction Noted  . Thorazine [chlorpromazine hcl] Anaphylaxis 10/20/2010  . Acetaminophen Other (See Comments)   . Aspirin Other (See Comments)   . Aspirin-acetaminophen-caffeine Other (See Comments)   . Nsaids Nausea And Vomiting   . Other  03/22/2019   . Penicillins Nausea And Vomiting   . Tomato Rash 01/17/2012    Family History  Problem Relation Age of Onset  . Stroke Mother   . Heart attack Father   . Schizophrenia Other   . Drug abuse Other   . Alcohol abuse Other   . Colon cancer Other        aunt  . Obesity Other   . COPD Other   . Anxiety disorder Other   . GER disease Other   . Diabetes type II Other   . Anxiety disorder Other   . Depression Other   . Depression Sister   . Schizophrenia Sister   . Liver disease Neg Hx   . Inflammatory bowel disease Neg Hx     Social History   Socioeconomic History  . Marital status: Divorced    Spouse name: Not on file  . Number of  children: Not on file  . Years of education: Not on file  . Highest education level: High school graduate  Occupational History  . Occupation: disabled    Fish farm manager: UNEMPLOYED  Tobacco Use  . Smoking status: Current Every Day Smoker    Packs/day: 0.25    Years: 40.00    Pack years: 10.00    Types: Cigarettes  . Smokeless tobacco: Never Used  Vaping Use  . Vaping Use: Never used  Substance and Sexual Activity  . Alcohol use: No  . Drug use: No  . Sexual activity: Not Currently    Partners: Male    Birth control/protection: Surgical    Comment: hyst  Other Topics Concern  . Not on file  Social History Narrative  . Not on file   Social Determinants of Health   Financial Resource Strain: Low Risk   . Difficulty of Paying Living Expenses: Not hard at all  Food Insecurity: No Food Insecurity  . Worried About Charity fundraiser in the Last Year: Never true  . Ran Out of Food in the Last Year: Never true  Transportation Bryant: No Transportation Bryant  . Lack of Transportation (Medical): No  . Lack of Transportation (Non-Medical): No  Physical Activity: Insufficiently Active  . Days of Exercise per Week: 3 days  . Minutes of Exercise per Session: 30 min  Stress: Stress Concern Present  . Feeling of Stress : To some extent  Social  Connections: Moderately Isolated  . Frequency of Communication with Friends and Family: More than three times a week  . Frequency of Social Gatherings with Friends and Family: More than three times a week  . Attends Religious Services: 1 to 4 times per year  . Active Member of Clubs or Organizations: No  . Attends Archivist Meetings: Never  . Marital Status: Separated    Review of Systems: Gen: Denies fever, chills, anorexia. Denies fatigue, weakness, weight loss.  CV: Denies chest pain, palpitations, syncope, peripheral edema, and claudication. Resp: Denies dyspnea at rest, cough, wheezing, coughing up blood, and pleurisy. GI: see HPI Derm: Denies rash, itching, dry skin Psych: Denies depression, anxiety, memory loss, confusion. No homicidal or suicidal ideation.  Heme: Denies bruising, bleeding, and enlarged lymph nodes.  Physical Exam: BP 127/82   Pulse 100   Temp (!) 96.8 F (36 C) (Temporal)   Ht _0  (1.778 m)   Wt 209 lb 3.2 oz (94.9 kg)   BMI 30.02 kg/m  General:   Alert and oriented. No distress noted. Pleasant and cooperative.  Head:  Normocephalic and atraumatic. Eyes:  Conjuctiva clear without scleral icterus. Mouth:  Mask in place Abdomen:  +BS, soft, non-tender and non-distended. No rebound or guarding. No HSM or masses noted. Msk:  Symmetrical without gross deformities. Normal posture. Extremities:  Without edema. Neurologic:  Alert and  oriented x4 Psych:  Alert and cooperative. Normal mood and affect.  ASSESSMENT: KAMEISHA MALICKI is a 60 y.o. female presenting today with history of GERD, IBS, dysphagia, colonoscopy due in 2023 due to history of adenomas, and history of H.pylori with eradication documentation needed.   GERD: continues on Aciphex BID.   Dysphagia: known dysmotility. EGD last year with normal esophagus s/p dilation. Declining manometry.   History of H.pylori: unclear if she completed treatment appropriately due to cognitive  deficits. Discussed with [facility]. They will send pill pack out without Aciphex for 07/29/20 through 2/3. Urea breath test 2/3. I contacted [name], patient's sponsor,  and left voicemail regarding this after visit. Resume Aciphex 2/4.   IBS: refilled Bentyl. Take up to 3 times a day as needed.    PLAN:  Hold Aciphex starting 1/20 through 2/3. Urea breath test 2/3, then resume Aciphex  Recommend manometry if patient willing in future  Bentyl up to TID prn  Colonoscopy 2023  Return in 4 months  Denise Needs, PhD, [facility]

## 2020-07-23 NOTE — Patient Instructions (Signed)
I have refilled dicyclomine (Bentyl) for you. This is for belly cramping and looser stool. You can take it 3 times a day before meals on the day you have the crampy feeling and looser stool.   I am calling your pharmacy. We will stop Aciphex for 2 weeks, then you can go to the lab and have a breath test done at the end of 2 weeks. I will let you know when the Aciphex is stopping and when to do the test. We will call you after I speak with pharmacy.  Take very small bites, chew very well, and sit upright while eating.  We will see you in 4 months!  I enjoyed seeing you again today! As you know, I value our relationship and want to provide genuine, compassionate, and quality care. I welcome your feedback. If you receive a survey regarding your visit,  I greatly appreciate you taking time to fill this out. See you next time!  Annitta Needs, PhD, ANP-BC [facility]

## 2020-07-26 ENCOUNTER — Other Ambulatory Visit: Payer: Self-pay | Admitting: Family Medicine

## 2020-07-28 NOTE — Progress Notes (Signed)
Cc'ed to pcp °

## 2020-07-28 NOTE — Telephone Encounter (Signed)
Spoke with [name] and she will take pt to complete H. Plyori testing on 08/13/20. Pt isn't able to go on 08/12/20.

## 2020-07-29 ENCOUNTER — Telehealth: Payer: Self-pay

## 2020-07-29 NOTE — Telephone Encounter (Signed)
Form dropped off for [name] [facility] Form place in Dr Nicki Reaper folder   Pt call back [phone]

## 2020-08-02 DIAGNOSIS — H5213 Myopia, bilateral: Secondary | ICD-10-CM | POA: Diagnosis not present

## 2020-08-08 NOTE — Telephone Encounter (Signed)
FL two form was filled out please print medication list to go with this thank you

## 2020-08-21 DIAGNOSIS — R296 Repeated falls: Secondary | ICD-10-CM | POA: Diagnosis not present

## 2020-08-21 DIAGNOSIS — M170 Bilateral primary osteoarthritis of knee: Secondary | ICD-10-CM | POA: Diagnosis not present

## 2020-08-24 DIAGNOSIS — Z8619 Personal history of other infectious and parasitic diseases: Secondary | ICD-10-CM | POA: Diagnosis not present

## 2020-08-25 LAB — H. PYLORI BREATH TEST: H. pylori Breath Test: DETECTED — AB

## 2020-08-26 ENCOUNTER — Other Ambulatory Visit: Payer: Self-pay | Admitting: Family Medicine

## 2020-08-26 ENCOUNTER — Other Ambulatory Visit: Payer: Self-pay | Admitting: Gastroenterology

## 2020-08-26 ENCOUNTER — Other Ambulatory Visit (HOSPITAL_COMMUNITY): Payer: Self-pay | Admitting: Psychiatry

## 2020-08-26 MED ORDER — RABEPRAZOLE SODIUM 20 MG PO TBEC
20.0000 mg | DELAYED_RELEASE_TABLET | Freq: Two times a day (BID) | ORAL | 0 refills | Status: DC
Start: 2020-08-26 — End: 2020-11-08

## 2020-08-26 MED ORDER — PYLERA 140-125-125 MG PO CAPS
3.0000 | ORAL_CAPSULE | Freq: Three times a day (TID) | ORAL | 0 refills | Status: DC
Start: 2020-08-26 — End: 2020-11-08

## 2020-08-26 NOTE — Telephone Encounter (Signed)
Call [facility] for appt. Not seen in one year

## 2020-08-27 ENCOUNTER — Telehealth: Payer: Self-pay

## 2020-08-27 NOTE — Telephone Encounter (Signed)
Received a call from [facility]. There was some confusion about the Aciphex medication sent into them. Pt has already been taking her PPI bid and will continue while take Pylera to treat H. Pylori.

## 2020-08-30 ENCOUNTER — Telehealth (INDEPENDENT_AMBULATORY_CARE_PROVIDER_SITE_OTHER): Payer: Medicare Other | Admitting: Psychiatry

## 2020-08-30 ENCOUNTER — Other Ambulatory Visit: Payer: Self-pay

## 2020-08-30 ENCOUNTER — Encounter (HOSPITAL_COMMUNITY): Payer: Self-pay | Admitting: Psychiatry

## 2020-08-30 DIAGNOSIS — F251 Schizoaffective disorder, depressive type: Secondary | ICD-10-CM

## 2020-08-30 MED ORDER — BENZTROPINE MESYLATE 1 MG PO TABS: 1.0000 mg | ORAL_TABLET | Freq: Every day | ORAL | 2 refills | Status: DC

## 2020-08-30 MED ORDER — TRAZODONE HCL 100 MG PO TABS: ORAL_TABLET | ORAL | 2 refills | Status: DC

## 2020-08-30 MED ORDER — SERTRALINE HCL 100 MG PO TABS: 100.0000 mg | ORAL_TABLET | Freq: Every day | ORAL | 2 refills | Status: DC

## 2020-08-30 MED ORDER — PALIPERIDONE ER 9 MG PO TB24: 9.0000 mg | ORAL_TABLET | Freq: Every day | ORAL | 2 refills | Status: DC

## 2020-08-30 MED ORDER — LAMOTRIGINE 100 MG PO TABS: 100.0000 mg | ORAL_TABLET | Freq: Two times a day (BID) | ORAL | 2 refills | Status: DC
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
## 2020-08-31 ENCOUNTER — Ambulatory Visit (INDEPENDENT_AMBULATORY_CARE_PROVIDER_SITE_OTHER): Payer: Medicare Other | Admitting: Family Medicine

## 2020-08-31 ENCOUNTER — Encounter: Payer: Self-pay | Admitting: Family Medicine

## 2020-08-31 ENCOUNTER — Other Ambulatory Visit: Payer: Self-pay

## 2020-08-31 VITALS — BP 112/70 | HR 119 | Temp 97.0°F | Wt 202.4 lb

## 2020-08-31 DIAGNOSIS — M170 Bilateral primary osteoarthritis of knee: Secondary | ICD-10-CM | POA: Diagnosis not present

## 2020-08-31 DIAGNOSIS — I10 Essential (primary) hypertension: Secondary | ICD-10-CM

## 2020-08-31 DIAGNOSIS — E876 Hypokalemia: Secondary | ICD-10-CM

## 2020-08-31 DIAGNOSIS — M25569 Pain in unspecified knee: Secondary | ICD-10-CM | POA: Diagnosis not present

## 2020-08-31 DIAGNOSIS — E1169 Type 2 diabetes mellitus with other specified complication: Secondary | ICD-10-CM

## 2020-08-31 DIAGNOSIS — E785 Hyperlipidemia, unspecified: Secondary | ICD-10-CM | POA: Diagnosis not present

## 2020-08-31 NOTE — Progress Notes (Signed)
Subjective:    Patient ID: [name], female    DOB: Jul 06, 1961, 60 y.o.   MRN: [id number]  HPI Pt here for follow up. Pt states last night she has some vomiting and diarrhea due to medications.(Pt did not eat much yesterday)   Pt having knee pain; pt would like knee surgery.   Pt has some H.Pylori in abdominal area and has seen [employer].   Essential hypertension  Arthralgia of lower leg, unspecified laterality  Primary osteoarthritis of both knees  Hyperlipidemia associated with type 2 diabetes mellitus ([place])  Hypokalemia Please see discussion below Patient has severe pain and discomfort which limits her with walking. Patient does have mild mental retardation.  She is not capable of taking care of herself after a surgery such as this would need rehab.  We have discussed this with her as well as her social worker They will contemplate they are willing to get a second opinion regarding her knee  Review of Systems  Constitutional: Negative for activity change and appetite change.  HENT: Negative for congestion and rhinorrhea.   Respiratory: Negative for cough and shortness of breath.   Cardiovascular: Negative for chest pain and leg swelling.  Gastrointestinal: Negative for abdominal pain, nausea and vomiting.  Musculoskeletal: Positive for arthralgias. Negative for back pain.  Skin: Negative for color change.  Neurological: Negative for dizziness and weakness.  Psychiatric/Behavioral: Negative for agitation and confusion.       Objective:   Physical Exam Vitals reviewed.  Constitutional:      Appearance: She is well-developed and well-nourished.  HENT:     Head: Normocephalic.  Cardiovascular:     Rate and Rhythm: Normal rate and regular rhythm.     Heart sounds: Normal heart sounds. No murmur heard.   Pulmonary:     Effort: Pulmonary effort is normal.     Breath sounds: Normal breath sounds.  Skin:    General: Skin is warm and dry.  Neurological:      Mental Status: She is alert.  Psychiatric:        Mood and Affect: Mood and affect normal.            Assessment & Plan:  1. Essential hypertension Blood pressure very good control continue current diet stay active.  We did discuss healthy diet.  Patient will do a better job of getting vegetables in the diet  2. Arthralgia of lower leg, unspecified laterality Please see discussion below  3. Primary osteoarthritis of both knees Severe osteoarthritis of both knees with osteophytes.  She is seeing local orthopedics that at the time of evaluation 2 years ago did not want to do surgery.  We will get second opinion with [facility].  If she does need knee replacement she would need to utilize a nursing home rehabilitation for potentially 2 to 4 weeks until she is able to get around to help herself.  She has a aide that comes 5 hours a day to her apartment but she does not have anyone else that she can depend on at other times of the day therefore rehabilitation at a facility would be advised  From a overall physical condition I believe that she would be capable of going through the knee replacement surgery if they decide to do so.  She does have social services that works with her.  She does have mild mental retardation but I believe she understands the aspect of the knee surgery and the recovery as best as  she possibly can  4. Hyperlipidemia associated with type 2 diabetes mellitus ([place]) Her recent A1c 6.5 I am choosing not to initiate any medications currently we will recheck A1c in several months healthy diet approach would be the best approach currently to avoid low sugar spells.  Potassium low add additional potassium in the evening she gets her medications through [facility], she gets them in bubble packs.  Recheck in 3 months.

## 2020-08-31 NOTE — Addendum Note (Signed)
Addended by: Vicente Males on: 08/31/2020 04:56 PM   Modules accepted: Orders

## 2020-08-31 NOTE — Progress Notes (Signed)
08/31/20- referral to [facility] placed

## 2020-09-08 ENCOUNTER — Telehealth: Payer: Self-pay | Admitting: Orthopedic Surgery

## 2020-09-08 DIAGNOSIS — H524 Presbyopia: Secondary | ICD-10-CM | POA: Diagnosis not present

## 2020-09-08 DIAGNOSIS — H52223 Regular astigmatism, bilateral: Secondary | ICD-10-CM | POA: Diagnosis not present

## 2020-09-08 NOTE — Telephone Encounter (Signed)
Call from patient's social worker (was with patient) regarding process for requesting medical records and imaging. Said she will need to take to [place]. Discussed; voiced understanding.* *Patient came to office; signed release form, and picked up records.

## 2020-09-13 ENCOUNTER — Telehealth: Payer: Self-pay | Admitting: Family Medicine

## 2020-09-13 DIAGNOSIS — M2040 Other hammer toe(s) (acquired), unspecified foot: Secondary | ICD-10-CM

## 2020-09-13 NOTE — Telephone Encounter (Signed)
Patient's social worker [name]) states that the diabetic shoes that came from [place] fix patient's feet rubbing against her toes which she has hammered toes and making them sore. Is there any other suggestion that she can get for her feet. Please Advise

## 2020-09-13 NOTE — Telephone Encounter (Signed)
She can see if apothecary would be willing to try to get a different pair  Other option would be seeing podiatry May have referral if they desire to do so

## 2020-09-13 NOTE — Telephone Encounter (Signed)
Left message to return call 

## 2020-09-14 NOTE — Telephone Encounter (Signed)
Left message to return call 

## 2020-09-14 NOTE — Telephone Encounter (Signed)
[name] states [name] ap[facility] already told her they did not have any other options so she wants referral to podiatry. Referral put in

## 2020-09-14 NOTE — Telephone Encounter (Signed)
[name] returned call and left message on voicemail. [name] will be out of the the office until 2 pm today-would like call back after 2.

## 2020-09-18 DIAGNOSIS — R296 Repeated falls: Secondary | ICD-10-CM | POA: Diagnosis not present

## 2020-09-18 DIAGNOSIS — M170 Bilateral primary osteoarthritis of knee: Secondary | ICD-10-CM | POA: Diagnosis not present

## 2020-09-19 ENCOUNTER — Encounter: Payer: Self-pay | Admitting: Family Medicine

## 2020-09-19 DIAGNOSIS — I700 Atherosclerosis of aorta: Secondary | ICD-10-CM

## 2020-09-19 HISTORY — DX: Atherosclerosis of aorta: I70.0

## 2020-10-04 DIAGNOSIS — M25562 Pain in left knee: Secondary | ICD-10-CM | POA: Diagnosis not present

## 2020-10-04 DIAGNOSIS — M1711 Unilateral primary osteoarthritis, right knee: Secondary | ICD-10-CM | POA: Diagnosis not present

## 2020-10-05 ENCOUNTER — Ambulatory Visit: Payer: Self-pay | Admitting: Student

## 2020-10-14 ENCOUNTER — Other Ambulatory Visit: Payer: Self-pay

## 2020-10-14 ENCOUNTER — Ambulatory Visit: Payer: Medicare Other

## 2020-10-14 ENCOUNTER — Encounter: Payer: Self-pay | Admitting: Podiatry

## 2020-10-14 ENCOUNTER — Ambulatory Visit (INDEPENDENT_AMBULATORY_CARE_PROVIDER_SITE_OTHER): Payer: Medicare Other | Admitting: Podiatry

## 2020-10-14 DIAGNOSIS — R0989 Other specified symptoms and signs involving the circulatory and respiratory systems: Secondary | ICD-10-CM | POA: Diagnosis not present

## 2020-10-14 DIAGNOSIS — B351 Tinea unguium: Secondary | ICD-10-CM

## 2020-10-14 DIAGNOSIS — E1142 Type 2 diabetes mellitus with diabetic polyneuropathy: Secondary | ICD-10-CM | POA: Diagnosis not present

## 2020-10-14 DIAGNOSIS — M2041 Other hammer toe(s) (acquired), right foot: Secondary | ICD-10-CM

## 2020-10-14 DIAGNOSIS — M79676 Pain in unspecified toe(s): Secondary | ICD-10-CM

## 2020-10-16 NOTE — Progress Notes (Signed)
Subjective:  Patient ID: [name], female    DOB: 1961/03/04,  MRN: [id number] HPI Chief Complaint  Patient presents with  . Diabetes    Diabetic - can't find good shoes to wear, feet swollen and sore a lot, interested in diabetic shoes and insoles, also toenails are long and can't trim herself, last a1c was 6.5  . New Patient (Initial Visit)    60 y.o. female presents with the above complaint.   ROS: Denies fever chills nausea vomiting muscle aches pains calf pain back pain chest pain shortness of breath.  Past Medical History:  Diagnosis Date  . Anxiety   . Aortic atherosclerosis ([place]) 09/19/2020   Seen on CAT scan 2021  . Diabetes mellitus   . Diabetes mellitus, type II ([place])   . HTN (hypertension)   . Hyperglycemia   . IBS (irritable bowel syndrome)   . Lung nodules    right, followed by PCP, PET 11/2011  . Mental retardation   . MI (myocardial infarction) ([place])   . Migraines   . Sleep apnea    Past Surgical History:  Procedure Laterality Date  . ABDOMINAL HYSTERECTOMY    . BIOPSY  01/15/2020   Procedure: BIOPSY;  Surgeon: Daneil Dolin, MD;  [facility];  Service: Endoscopy;;  gastric  . COLONOSCOPY  11/2007   hyperplastic polyps, prior hx of adenomas   . COLONOSCOPY  05/2010   incomplete due to poor prep, hyperplastic rectal polyp  . COLONOSCOPY  05/05/2002   Dimunitive polyps in the rectum and left colon, cold    biopsied/removed.  Scattered few left-sided diverticula.  Regular colonic   mucosa appeared normal  . COLONOSCOPY N/A 05/28/2013   Rourk: mulitple tubular adenomas removed. next tcs 05/2016  . COLONOSCOPY WITH PROPOFOL N/A 09/07/2016   Dr. Gala Romney: For hyperplastic polyps removed. Next colonoscopy March 2023 given history of adenomatous colon polyps in the past.  . ESOPHAGOGASTRODUODENOSCOPY  08/2007   moderate sized hiatal hernia  . ESOPHAGOGASTRODUODENOSCOPY  05/2010   noncritical schatzki ring s/p 50F  . ESOPHAGOGASTRODUODENOSCOPY (EGD)  WITH ESOPHAGEAL DILATION N/A 02/06/2013   UYQ:IHKVQQ esophagus-s/p dilation up to a 67 [place] size with [facility] dilators.  Hiatal hernia  . ESOPHAGOGASTRODUODENOSCOPY (EGD) WITH PROPOFOL N/A 09/07/2016   Dr. Gala Romney: Normal, status post empiric dilation of the esophagus for history of dysphagia  . ESOPHAGOGASTRODUODENOSCOPY (EGD) WITH PROPOFOL N/A 05/02/2019   Normal esophagus s/p dilation, normal stomach, normal duodenum  . ESOPHAGOGASTRODUODENOSCOPY (EGD) WITH PROPOFOL N/A 01/15/2020   normal esophagus s/p dilation, gastric nodule s/p biopsy. This showed reactive gastropathy with H.pylori.   [facility] EXTERNAL EAR SURGERY     bilateral  . FOOT SURGERY    . GLAUCOMA SURGERY    . MALONEY DILATION N/A 09/07/2016   Procedure: Venia Minks DILATION;  Surgeon: Daneil Dolin, MD;  [facility];  Service: Endoscopy;  Laterality: N/A;  . Venia Minks DILATION N/A 05/02/2019   Procedure: Venia Minks DILATION;  Surgeon: Daneil Dolin, MD;  [facility];  Service: Endoscopy;  Laterality: N/A;  . Venia Minks DILATION N/A 01/15/2020   Procedure: Venia Minks DILATION;  Surgeon: Daneil Dolin, MD;  [facility];  Service: Endoscopy;  Laterality: N/A;  . POLYPECTOMY  09/07/2016   Procedure: POLYPECTOMY;  Surgeon: Daneil Dolin, MD;  [facility];  Service: Endoscopy;;  sigmoid colon x4  . small bowel capsule  10/2007   normal    Current Outpatient Medications:  .  atorvastatin (  LIPITOR) 10 MG tablet, TAKE 1 TABLET BY MOUTH ONCE A DAY. (Patient taking differently: Take 10 mg by mouth daily.), Disp: 30 tablet, Rfl: 0 .  benztropine (COGENTIN) 1 MG tablet, Take 1 tablet (1 mg total) by mouth daily., Disp: 30 tablet, Rfl: 2 .  bismuth-metronidazole-tetracycline (PYLERA) 140-125-125 MG capsule, Take 3 capsules by mouth 4 (four) times daily -  before meals and at bedtime for 10 days. (Patient not taking: Reported on 10/12/2020), Disp: 120 capsule, Rfl: 0 .  blood glucose meter kit and supplies, Dispense  based on patient and insurance preference. Use to test glucose once daily. (FOR ICD10, E11.9)., Disp: 1 each, Rfl: 0 .  cycloSPORINE (RESTASIS) 0.05 % ophthalmic emulsion, Place 1 drop into both eyes 2 (two) times daily., Disp: , Rfl:  .  dicyclomine (BENTYL) 10 MG capsule, Take 1 capsule (10 mg total) by mouth 3 (three) times daily before meals. As needed for loose stool and belly cramping., Disp: 90 capsule, Rfl: 5 .  lamoTRIgine (LAMICTAL) 100 MG tablet, Take 1 tablet (100 mg total) by mouth 2 (two) times daily., Disp: 60 tablet, Rfl: 2 .  paliperidone (INVEGA) 9 MG 24 hr tablet, Take 1 tablet (9 mg total) by mouth daily., Disp: 30 tablet, Rfl: 2 .  polyethylene glycol (MIRALAX / GLYCOLAX) 17 g packet, Take 17 g by mouth daily as needed for moderate constipation., Disp: , Rfl:  .  potassium chloride (KLOR-CON) 10 MEQ tablet, Take 2 tablets (20 mEq total) by mouth 2 (two) times daily. with food, Disp: 180 tablet, Rfl: 5 .  propranolol (INDERAL) 10 MG tablet, TAKE 1 TABLET BY MOUTH THREE TIMES A DAY. (Patient taking differently: Take 10 mg by mouth 3 (three) times daily.), Disp: 90 tablet, Rfl: 5 .  RABEprazole (ACIPHEX) 20 MG tablet, Take 1 tablet (20 mg total) by mouth 2 (two) times daily before a meal., Disp: 60 tablet, Rfl: 11 .  RABEprazole (ACIPHEX) 20 MG tablet, Take 1 tablet (20 mg total) by mouth 2 (two) times daily before a meal for 10 days. While taking Pylera, then resume once daily (Patient not taking: Reported on 10/12/2020), Disp: 20 tablet, Rfl: 0 .  sertraline (ZOLOFT) 100 MG tablet, Take 1 tablet (100 mg total) by mouth daily., Disp: 30 tablet, Rfl: 2 .  torsemide (DEMADEX) 20 MG tablet, TAKE (1) TABLET BY MOUTH EACH MORNING. (Patient taking differently: Take 20 mg by mouth in the morning.), Disp: 30 tablet, Rfl: 0 .  traZODone (DESYREL) 100 MG tablet, TAKE 2 TABLETS(200MG) BY MOUTH AT BEDTIME. (Patient taking differently: Take 200 mg by mouth at bedtime.), Disp: 60 tablet, Rfl:  2  Allergies  Allergen Reactions  . Thorazine [Chlorpromazine Hcl] Anaphylaxis  . Acetaminophen Other (See Comments)    Makes pt dizzy  . Aspirin Other (See Comments)    seizure  . Aspirin-Acetaminophen-Caffeine Other (See Comments)    seizure  . Nsaids Nausea And Vomiting  . Other     Acidic foods  . Penicillins Nausea And Vomiting    Has patient had a PCN reaction causing immediate rash, facial/tongue/throat swelling, SOB or lightheadedness with hypotension:Yes Has patient had a PCN reaction causing severe rash involving mucus membranes or skin necrosis:Yes Has patient had a PCN reaction that required hospitalization:Yes Has patient had a PCN reaction occurring within the last 10 years:No If all of the above answers are "NO", then may proceed with Cephalosporin use.   . Tomato Rash   Review of Systems Objective:  There were  no vitals filed for this visit.  General: Well developed, nourished, in no acute distress, alert and oriented x3   Dermatological: Skin is warm, dry and supple bilateral. Nails x 10 are well maintained; remaining integument appears unremarkable at this time. There are no open sores, no preulcerative lesions, no rash or signs of infection present.  Vascular: Dorsalis Pedis artery and Posterior Tibial artery pedal pulses diminished bilateral with immedate capillary fill time. Pedal hair growth absent. No varicosities and no lower extremity edema present bilateral.   Neruologic: Grossly intact via light touch bilateral. Vibratory intact via tuning fork bilateral. Protective threshold with Semmes Wienstein monofilament diminished to all pedal sites bilateral. Patellar and Achilles deep tendon reflexes 2+ bilateral. No Babinski or clonus noted bilateral.   Musculoskeletal: No gross boney pedal deformities bilateral. No pain, crepitus, or limitation noted with foot and ankle range of motion bilateral. Muscular strength 5/5 in all groups tested bilateral.  Hammertoe  deformities noted bilateral hallux valgus deformities noted bilateral.  Gait: Unassisted, Nonantalgic.    Radiographs:  None taken  Assessment & Plan:   Assessment: Diabetes mellitus with diabetic peripheral neuropathy hammertoe deformity and peripheral vascular disease.  Plan: We are going to refer her to the vascular clinic for ABIs to evaluate the necessity of revascularization of the bilateral lower extremity.  Were also going to request a pair of diabetic shoes before any ulcerations have occurred that will not heal due to the lack of vascular status.  She will also follow-up with Dr. Adah Perl for routine debridement.      T. [place], [place]

## 2020-10-19 ENCOUNTER — Ambulatory Visit: Payer: Self-pay | Admitting: Student

## 2020-10-19 DIAGNOSIS — M170 Bilateral primary osteoarthritis of knee: Secondary | ICD-10-CM | POA: Diagnosis not present

## 2020-10-19 DIAGNOSIS — R296 Repeated falls: Secondary | ICD-10-CM | POA: Diagnosis not present

## 2020-10-19 NOTE — Progress Notes (Signed)
DUE TO COVID-19 ONLY ONE VISITOR IS ALLOWED TO COME WITH YOU AND STAY IN THE WAITING ROOM ONLY DURING PRE OP AND PROCEDURE DAY OF SURGERY. THE 1 VISITOR  MAY VISIT WITH YOU AFTER SURGERY IN YOUR PRIVATE ROOM DURING VISITING HOURS ONLY!  YOU NEED TO HAVE A COVID 19 TEST ON___4/25/22___ @_______ , THIS TEST MUST BE DONE BEFORE SURGERY,  COVID TESTING SITE [address] [place] [address], IT IS ON THE RIGHT GOING OUT [address] APPROXIMATELY  2 MINUTES PAST [employer] ON THE RIGHT. ONCE YOUR COVID TEST IS COMPLETED,  PLEASE BEGIN THE QUARANTINE INSTRUCTIONS AS OUTLINED IN YOUR HANDOUT.                [name]  10/19/2020   Your procedure is scheduled on:  11/04/20  Report to [facility] Main  Entrance   Report to admitting at     [place] AM     Call this number if you have problems the morning of surgery [phone]    REMEMBER: NO  SOLID FOOD CANDY OR GUM AFTER MIDNIGHT. CLEAR LIQUIDS UNTIL      0745am    . NOTHING BY MOUTH EXCEPT CLEAR LIQUIDS UNTIL      0745am    . PLEASE FINISH ENSURE DRINK PER SURGEON ORDER  WHICH NEEDS TO BE COMPLETED AT  0745am     .      CLEAR LIQUID DIET   Foods Allowed                                                                    Coffee and tea, regular and decaf                            Fruit ices (not with fruit pulp)                                      Iced Popsicles                                    Carbonated beverages, regular and diet                                    Cranberry, grape and apple juices Sports drinks like Gatorade Lightly seasoned clear broth or consume(fat free) Sugar, honey syrup ___________________________________________________________________      BRUSH YOUR TEETH MORNING OF SURGERY AND RINSE YOUR MOUTH OUT, NO CHEWING GUM CANDY OR MINTS.     Take these medicines the morning of surgery with A SIP OF WATER: eye drops as usual, lamictal, inderal, aciphex, zoloft  DO NOT TAKE ANY  DIABETIC MEDICATIONS DAY OF YOUR SURGERY                               You may not have any metal on your body including hair pins and  piercings  Do not wear jewelry, make-up, lotions, powders or perfumes, deodorant             Do not wear nail polish on your fingernails.  Do not shave  48 hours prior to surgery.              Men may shave face and neck.   Do not bring valuables to the hospital. [place].  Contacts, dentures or bridgework may not be worn into surgery.  Leave suitcase in the car. After surgery it may be brought to your room.     Patients discharged the day of surgery will not be allowed to drive home. IF YOU ARE HAVING SURGERY AND GOING HOME THE SAME DAY, YOU MUST HAVE AN ADULT TO DRIVE YOU HOME AND BE WITH YOU FOR 24 HOURS. YOU MAY GO HOME BY TAXI OR UBER OR ORTHERWISE, BUT AN ADULT MUST ACCOMPANY YOU HOME AND STAY WITH YOU FOR 24 HOURS.  Name and phone number of your driver:  Special Instructions: N/A              Please read over the following fact sheets you were given: _____________________________________________________________________  [facility] - Preparing for Surgery Before surgery, you can play an important role.  Because skin is not sterile, your skin needs to be as free of germs as possible.  You can reduce the number of germs on your skin by washing with CHG (chlorahexidine gluconate) soap before surgery.  CHG is an antiseptic cleaner which kills germs and bonds with the skin to continue killing germs even after washing. Please DO NOT use if you have an allergy to CHG or antibacterial soaps.  If your skin becomes reddened/irritated stop using the CHG and inform your nurse when you arrive at Short Stay. Do not shave (including legs and underarms) for at least 48 hours prior to the first CHG shower.  You may shave your face/neck. Please follow these instructions carefully:  1.  Shower with CHG Soap  the night before surgery and the  morning of Surgery.  2.  If you choose to wash your hair, wash your hair first as usual with your  normal  shampoo.  3.  After you shampoo, rinse your hair and body thoroughly to remove the  shampoo.                           4.  Use CHG as you would any other liquid soap.  You can apply chg directly  to the skin and wash                       Gently with a scrungie or clean washcloth.  5.  Apply the CHG Soap to your body ONLY FROM THE NECK DOWN.   Do not use on face/ open                           Wound or open sores. Avoid contact with eyes, ears mouth and genitals (private parts).                       Wash face,  Genitals (private parts) with your normal soap.             6.  Wash thoroughly, paying special attention to the area where your surgery  will be performed.  7.  Thoroughly rinse your body with warm water from the neck down.  8.  DO NOT shower/wash with your normal soap after using and rinsing off  the CHG Soap.                9.  Pat yourself dry with a clean towel.            10.  Wear clean pajamas.            11.  Place clean sheets on your bed the night of your first shower and do not  sleep with pets. Day of Surgery : Do not apply any lotions/deodorants the morning of surgery.  Please wear clean clothes to the hospital/surgery center.  FAILURE TO FOLLOW THESE INSTRUCTIONS MAY RESULT IN THE CANCELLATION OF YOUR SURGERY PATIENT SIGNATURE_________________________________  NURSE SIGNATURE__________________________________  ________________________________________________________________________

## 2020-10-19 NOTE — H&P (View-Only) (Signed)
TOTAL KNEE ADMISSION H&P  Patient is being admitted for right total knee arthroplasty.  Subjective:  Chief Complaint:right knee pain.  HPI: [name], 60 y.o. female, has a history of pain and functional disability in the right knee due to arthritis and has failed non-surgical conservative treatments for greater than 12 weeks to includeNSAID's and/or analgesics and activity modification.  Onset of symptoms was gradual, starting 3 years ago with gradually worsening course since that time. The patient noted no past surgery on the bilaterally knee(s).  Patient currently rates pain in the right knee(s) at 8 out of 10 with activity. Patient has worsening of pain with activity and weight bearing, pain that interferes with activities of daily living, pain with passive range of motion and joint swelling.  Patient has evidence of subchondral cysts, subchondral sclerosis and joint space narrowing by imaging studies. There is no active infection.  Patient Active Problem List   Diagnosis Date Noted  . Aortic atherosclerosis ([place]) 09/19/2020  . History of Helicobacter pylori infection 07/23/2020  . S/P hysterectomy 04/19/2020  . History of vaginal bleeding 04/19/2020  . Fall in bathtub   . Neck pain   . Primary osteoarthritis of both knees 01/27/2019  . Hyperlipidemia 09/26/2017  . Dysphagia 08/15/2016  . Obstructive sleep apnea syndrome 06/27/2016  . Rectal bleeding 04/07/2013  . Melena 03/04/2013  . Hematemesis 03/04/2013  . Abdominal pain, epigastric 03/04/2013  . Hypokalemia 03/04/2013  . Smoker 01/16/2012  . Diarrhea 12/26/2011  . Pulmonary nodule, right 12/26/2011  . Abdominal pain 10/08/2011  . Fatty liver 08/24/2011  . Constipation 10/20/2010  . NAUSEA AND VOMITING 06/09/2010  . HEMATOCHEZIA 04/21/2010  . OTHER DYSPHAGIA 04/21/2010  . MILD MENTAL RETARDATION 02/17/2009  . GLAUCOMA 02/17/2009  . ANXIETY 01/15/2009  . MIGRAINE HEADACHE 01/15/2009  . Essential hypertension  01/15/2009  . HEMORRHOIDS 01/15/2009  . GASTROESOPHAGEAL REFLUX DISEASE, CHRONIC 01/15/2009  . CONSTIPATION, CHRONIC 01/15/2009  . IBS (irritable bowel syndrome) 01/15/2009  . KNEE PAIN, CHRONIC 01/15/2009  . BACK PAIN, CHRONIC 01/15/2009  . Hx of adenomatous colonic polyps 01/15/2009   Past Medical History:  Diagnosis Date  . Anxiety   . Aortic atherosclerosis ([place]) 09/19/2020   Seen on CAT scan 2021  . Diabetes mellitus   . Diabetes mellitus, type II ([place])   . HTN (hypertension)   . Hyperglycemia   . IBS (irritable bowel syndrome)   . Lung nodules    right, followed by PCP, PET 11/2011  . Mental retardation   . MI (myocardial infarction) ([place])   . Migraines   . Sleep apnea     Past Surgical History:  Procedure Laterality Date  . ABDOMINAL HYSTERECTOMY    . BIOPSY  01/15/2020   Procedure: BIOPSY;  Surgeon: Daneil Dolin, MD;  [facility];  Service: Endoscopy;;  gastric  . COLONOSCOPY  11/2007   hyperplastic polyps, prior hx of adenomas   . COLONOSCOPY  05/2010   incomplete due to poor prep, hyperplastic rectal polyp  . COLONOSCOPY  05/05/2002   Dimunitive polyps in the rectum and left colon, cold    biopsied/removed.  Scattered few left-sided diverticula.  Regular colonic   mucosa appeared normal  . COLONOSCOPY N/A 05/28/2013   Rourk: mulitple tubular adenomas removed. next tcs 05/2016  . COLONOSCOPY WITH PROPOFOL N/A 09/07/2016   Dr. Gala Romney: For hyperplastic polyps removed. Next colonoscopy March 2023 given history of adenomatous colon polyps in the past.  . ESOPHAGOGASTRODUODENOSCOPY  08/2007   moderate sized  hiatal hernia  . ESOPHAGOGASTRODUODENOSCOPY  05/2010   noncritical schatzki ring s/p 56F  . ESOPHAGOGASTRODUODENOSCOPY (EGD) WITH ESOPHAGEAL DILATION N/A 02/06/2013   RMR:Normal esophagus-s/p dilation up to a 58 French size with [facility] dilators.  Hiatal hernia  . ESOPHAGOGASTRODUODENOSCOPY (EGD) WITH PROPOFOL N/A 09/07/2016   Dr. Rourk: Normal, status post  empiric dilation of the esophagus for history of dysphagia  . ESOPHAGOGASTRODUODENOSCOPY (EGD) WITH PROPOFOL N/A 05/02/2019   Normal esophagus s/p dilation, normal stomach, normal duodenum  . ESOPHAGOGASTRODUODENOSCOPY (EGD) WITH PROPOFOL N/A 01/15/2020   normal esophagus s/p dilation, gastric nodule s/p biopsy. This showed reactive gastropathy with H.pylori.   . EXTERNAL EAR SURGERY     bilateral  . FOOT SURGERY    . GLAUCOMA SURGERY    . MALONEY DILATION N/A 09/07/2016   Procedure: MALONEY DILATION;  Surgeon: Robert M Rourk, MD;  [facility];  Service: Endoscopy;  Laterality: N/A;  . MALONEY DILATION N/A 05/02/2019   Procedure: MALONEY DILATION;  Surgeon: Rourk, Robert M, MD;  [facility];  Service: Endoscopy;  Laterality: N/A;  . MALONEY DILATION N/A 01/15/2020   Procedure: MALONEY DILATION;  Surgeon: Rourk, Robert M, MD;  [facility];  Service: Endoscopy;  Laterality: N/A;  . POLYPECTOMY  09/07/2016   Procedure: POLYPECTOMY;  Surgeon: Robert M Rourk, MD;  [facility];  Service: Endoscopy;;  sigmoid colon x4  . small bowel capsule  10/2007   normal    Current Outpatient Medications  Medication Sig Dispense Refill Last Dose  . atorvastatin (LIPITOR) 10 MG tablet TAKE 1 TABLET BY MOUTH ONCE A DAY. (Patient taking differently: Take 10 mg by mouth daily.) 30 tablet 0   . benztropine (COGENTIN) 1 MG tablet Take 1 tablet (1 mg total) by mouth daily. 30 tablet 2   . bismuth-metronidazole-tetracycline (PYLERA) 140-125-125 MG capsule Take 3 capsules by mouth 4 (four) times daily -  before meals and at bedtime for 10 days. (Patient not taking: Reported on 10/12/2020) 120 capsule 0   . blood glucose meter kit and supplies Dispense based on patient and insurance preference. Use to test glucose once daily. (FOR ICD10, E11.9). 1 each 0   . cycloSPORINE (RESTASIS) 0.05 % ophthalmic emulsion Place 1 drop into both eyes 2 (two) times daily.     . dicyclomine (BENTYL)  10 MG capsule Take 1 capsule (10 mg total) by mouth 3 (three) times daily before meals. As needed for loose stool and belly cramping. 90 capsule 5   . lamoTRIgine (LAMICTAL) 100 MG tablet Take 1 tablet (100 mg total) by mouth 2 (two) times daily. 60 tablet 2   . paliperidone (INVEGA) 9 MG 24 hr tablet Take 1 tablet (9 mg total) by mouth daily. 30 tablet 2   . polyethylene glycol (MIRALAX / GLYCOLAX) 17 g packet Take 17 g by mouth daily as needed for moderate constipation.     . potassium chloride (KLOR-CON) 10 MEQ tablet Take 2 tablets (20 mEq total) by mouth 2 (two) times daily. with food 180 tablet 5   . propranolol (INDERAL) 10 MG tablet TAKE 1 TABLET BY MOUTH THREE TIMES A DAY. (Patient taking differently: Take 10 mg by mouth 3 (three) times daily.) 90 tablet 5   . RABEprazole (ACIPHEX) 20 MG tablet Take 1 tablet (20 mg total) by mouth 2 (two) times daily before a meal. 60 tablet 11   . RABEprazole (ACIPHEX) 20 MG tablet Take 1 tablet (20 mg total) by mouth 2 (  two) times daily before a meal for 10 days. While taking Pylera, then resume once daily (Patient not taking: Reported on 10/12/2020) 20 tablet 0   . sertraline (ZOLOFT) 100 MG tablet Take 1 tablet (100 mg total) by mouth daily. 30 tablet 2   . torsemide (DEMADEX) 20 MG tablet TAKE (1) TABLET BY MOUTH EACH MORNING. (Patient taking differently: Take 20 mg by mouth in the morning.) 30 tablet 0   . traZODone (DESYREL) 100 MG tablet TAKE 2 TABLETS(200MG) BY MOUTH AT BEDTIME. (Patient taking differently: Take 200 mg by mouth at bedtime.) 60 tablet 2    No current facility-administered medications for this visit.   Allergies  Allergen Reactions  . Thorazine [Chlorpromazine Hcl] Anaphylaxis  . Acetaminophen Other (See Comments)    Makes pt dizzy  . Aspirin Other (See Comments)    seizure  . Aspirin-Acetaminophen-Caffeine Other (See Comments)    seizure  . Nsaids Nausea And Vomiting  . Other     Acidic foods  . Penicillins Nausea And  Vomiting    Has patient had a PCN reaction causing immediate rash, facial/tongue/throat swelling, SOB or lightheadedness with hypotension:Yes Has patient had a PCN reaction causing severe rash involving mucus membranes or skin necrosis:Yes Has patient had a PCN reaction that required hospitalization:Yes Has patient had a PCN reaction occurring within the last 10 years:No If all of the above answers are "NO", then may proceed with Cephalosporin use.   . Tomato Rash    Social History   Tobacco Use  . Smoking status: Current Every Day Smoker    Packs/day: 0.25    Years: 40.00    Pack years: 10.00    Types: Cigarettes  . Smokeless tobacco: Never Used  Substance Use Topics  . Alcohol use: No    Family History  Problem Relation Age of Onset  . Stroke Mother   . Heart attack Father   . Schizophrenia Other   . Drug abuse Other   . Alcohol abuse Other   . Colon cancer Other        aunt  . Obesity Other   . COPD Other   . Anxiety disorder Other   . GER disease Other   . Diabetes type II Other   . Anxiety disorder Other   . Depression Other   . Depression Sister   . Schizophrenia Sister   . Liver disease Neg Hx   . Inflammatory bowel disease Neg Hx      Review of Systems  Musculoskeletal: Positive for arthralgias.  All other systems reviewed and are negative.   Objective:  Physical Exam HENT:     Head: Normocephalic.  Eyes:     Pupils: Pupils are equal, round, and reactive to light.  Cardiovascular:     Rate and Rhythm: Normal rate and regular rhythm.     Pulses: Normal pulses.  Pulmonary:     Effort: Pulmonary effort is normal.  Abdominal:     Palpations: Abdomen is soft.     Tenderness: There is no abdominal tenderness.  Musculoskeletal:        General: Swelling present.     Cervical back: Normal range of motion.  Skin:    General: Skin is warm and dry.  Neurological:     Mental Status: She is alert. Mental status is at baseline.  Psychiatric:        Mood  and Affect: Mood normal.     Vital signs in last 24 hours: _0 @  Labs:  Estimated body mass index is 29.04 kg/m as calculated from the following:   Height as of 07/23/20: 5' 10" (1.778 m).   Weight as of 08/31/20: 91.8 kg.   Imaging Review Plain radiographs demonstrate severe degenerative joint disease of the right knee(s). The bone quality appears to be adequate for age and reported activity level.      Assessment/Plan:  End stage arthritis, bilaterally knee   The patient history, physical examination, clinical judgment of the provider and imaging studies are consistent with end stage degenerative joint disease of the bilaterally knee(s) and total knee arthroplasty is deemed medically necessary. The treatment options including medical management, injection therapy arthroscopy and arthroplasty were discussed at length. The risks and benefits of total knee arthroplasty were presented and reviewed. The risks due to aseptic loosening, infection, stiffness, patella tracking problems, thromboembolic complications and other imponderables were discussed. The patient acknowledged the explanation, agreed to proceed with the plan and consent was signed. Patient is being admitted for inpatient treatment for surgery, pain control, PT, OT, prophylactic antibiotics, VTE prophylaxis, progressive ambulation and ADL's and discharge planning. The patient is planning to be discharged home after overnight observation     Patient's anticipated LOS is less than 2 midnights, meeting these requirements: - Younger than 30 - Lives within 1 hour of care - Has a competent adult at home to recover with post-op recover - NO history of  - Chronic pain requiring opiods  - Diabetes  - Coronary Artery Disease  - Heart failure  - Heart attack  - Stroke  - DVT/VTE  - Cardiac arrhythmia  - Respiratory Failure/COPD  - Renal failure  - Anemia  - Advanced Liver disease

## 2020-10-19 NOTE — H&P (Signed)
TOTAL KNEE ADMISSION H&P  Patient is being admitted for right total knee arthroplasty.  Subjective:  Chief Complaint:right knee pain.  HPI: [name], 60 y.o. female, has a history of pain and functional disability in the right knee due to arthritis and has failed non-surgical conservative treatments for greater than 12 weeks to includeNSAID's and/or analgesics and activity modification.  Onset of symptoms was gradual, starting 3 years ago with gradually worsening course since that time. The patient noted no past surgery on the bilaterally knee(s).  Patient currently rates pain in the right knee(s) at 8 out of 10 with activity. Patient has worsening of pain with activity and weight bearing, pain that interferes with activities of daily living, pain with passive range of motion and joint swelling.  Patient has evidence of subchondral cysts, subchondral sclerosis and joint space narrowing by imaging studies. There is no active infection.  Patient Active Problem List   Diagnosis Date Noted  . Aortic atherosclerosis ([place]) 09/19/2020  . History of Helicobacter pylori infection 07/23/2020  . S/P hysterectomy 04/19/2020  . History of vaginal bleeding 04/19/2020  . Fall in bathtub   . Neck pain   . Primary osteoarthritis of both knees 01/27/2019  . Hyperlipidemia 09/26/2017  . Dysphagia 08/15/2016  . Obstructive sleep apnea syndrome 06/27/2016  . Rectal bleeding 04/07/2013  . Melena 03/04/2013  . Hematemesis 03/04/2013  . Abdominal pain, epigastric 03/04/2013  . Hypokalemia 03/04/2013  . Smoker 01/16/2012  . Diarrhea 12/26/2011  . Pulmonary nodule, right 12/26/2011  . Abdominal pain 10/08/2011  . Fatty liver 08/24/2011  . Constipation 10/20/2010  . NAUSEA AND VOMITING 06/09/2010  . HEMATOCHEZIA 04/21/2010  . OTHER DYSPHAGIA 04/21/2010  . MILD MENTAL RETARDATION 02/17/2009  . GLAUCOMA 02/17/2009  . ANXIETY 01/15/2009  . MIGRAINE HEADACHE 01/15/2009  . Essential hypertension  01/15/2009  . HEMORRHOIDS 01/15/2009  . GASTROESOPHAGEAL REFLUX DISEASE, CHRONIC 01/15/2009  . CONSTIPATION, CHRONIC 01/15/2009  . IBS (irritable bowel syndrome) 01/15/2009  . KNEE PAIN, CHRONIC 01/15/2009  . BACK PAIN, CHRONIC 01/15/2009  . Hx of adenomatous colonic polyps 01/15/2009   Past Medical History:  Diagnosis Date  . Anxiety   . Aortic atherosclerosis ([place]) 09/19/2020   Seen on CAT scan 2021  . Diabetes mellitus   . Diabetes mellitus, type II ([place])   . HTN (hypertension)   . Hyperglycemia   . IBS (irritable bowel syndrome)   . Lung nodules    right, followed by PCP, PET 11/2011  . Mental retardation   . MI (myocardial infarction) ([place])   . Migraines   . Sleep apnea     Past Surgical History:  Procedure Laterality Date  . ABDOMINAL HYSTERECTOMY    . BIOPSY  01/15/2020   Procedure: BIOPSY;  Surgeon: Daneil Dolin, MD;  [facility];  Service: Endoscopy;;  gastric  . COLONOSCOPY  11/2007   hyperplastic polyps, prior hx of adenomas   . COLONOSCOPY  05/2010   incomplete due to poor prep, hyperplastic rectal polyp  . COLONOSCOPY  05/05/2002   Dimunitive polyps in the rectum and left colon, cold    biopsied/removed.  Scattered few left-sided diverticula.  Regular colonic   mucosa appeared normal  . COLONOSCOPY N/A 05/28/2013   Rourk: mulitple tubular adenomas removed. next tcs 05/2016  . COLONOSCOPY WITH PROPOFOL N/A 09/07/2016   Dr. Gala Romney: For hyperplastic polyps removed. Next colonoscopy March 2023 given history of adenomatous colon polyps in the past.  . ESOPHAGOGASTRODUODENOSCOPY  08/2007   moderate sized  hiatal hernia  . ESOPHAGOGASTRODUODENOSCOPY  05/2010   noncritical schatzki ring s/p 70F  . ESOPHAGOGASTRODUODENOSCOPY (EGD) WITH ESOPHAGEAL DILATION N/A 02/06/2013   VZD:GLOVFI esophagus-s/p dilation up to a 65 [place] size with [facility] dilators.  Hiatal hernia  . ESOPHAGOGASTRODUODENOSCOPY (EGD) WITH PROPOFOL N/A 09/07/2016   Dr. Gala Romney: Normal, status post  empiric dilation of the esophagus for history of dysphagia  . ESOPHAGOGASTRODUODENOSCOPY (EGD) WITH PROPOFOL N/A 05/02/2019   Normal esophagus s/p dilation, normal stomach, normal duodenum  . ESOPHAGOGASTRODUODENOSCOPY (EGD) WITH PROPOFOL N/A 01/15/2020   normal esophagus s/p dilation, gastric nodule s/p biopsy. This showed reactive gastropathy with H.pylori.   Marland Kitchen EXTERNAL EAR SURGERY     bilateral  . FOOT SURGERY    . GLAUCOMA SURGERY    . MALONEY DILATION N/A 09/07/2016   Procedure: Venia Minks DILATION;  Surgeon: Daneil Dolin, MD;  [facility];  Service: Endoscopy;  Laterality: N/A;  . Venia Minks DILATION N/A 05/02/2019   Procedure: Venia Minks DILATION;  Surgeon: Daneil Dolin, MD;  [facility];  Service: Endoscopy;  Laterality: N/A;  . Venia Minks DILATION N/A 01/15/2020   Procedure: Venia Minks DILATION;  Surgeon: Daneil Dolin, MD;  [facility];  Service: Endoscopy;  Laterality: N/A;  . POLYPECTOMY  09/07/2016   Procedure: POLYPECTOMY;  Surgeon: Daneil Dolin, MD;  [facility];  Service: Endoscopy;;  sigmoid colon x4  . small bowel capsule  10/2007   normal    Current Outpatient Medications  Medication Sig Dispense Refill Last Dose  . atorvastatin (LIPITOR) 10 MG tablet TAKE 1 TABLET BY MOUTH ONCE A DAY. (Patient taking differently: Take 10 mg by mouth daily.) 30 tablet 0   . benztropine (COGENTIN) 1 MG tablet Take 1 tablet (1 mg total) by mouth daily. 30 tablet 2   . bismuth-metronidazole-tetracycline (PYLERA) 140-125-125 MG capsule Take 3 capsules by mouth 4 (four) times daily -  before meals and at bedtime for 10 days. (Patient not taking: Reported on 10/12/2020) 120 capsule 0   . blood glucose meter kit and supplies Dispense based on patient and insurance preference. Use to test glucose once daily. (FOR ICD10, E11.9). 1 each 0   . cycloSPORINE (RESTASIS) 0.05 % ophthalmic emulsion Place 1 drop into both eyes 2 (two) times daily.     Marland Kitchen dicyclomine (BENTYL)  10 MG capsule Take 1 capsule (10 mg total) by mouth 3 (three) times daily before meals. As needed for loose stool and belly cramping. 90 capsule 5   . lamoTRIgine (LAMICTAL) 100 MG tablet Take 1 tablet (100 mg total) by mouth 2 (two) times daily. 60 tablet 2   . paliperidone (INVEGA) 9 MG 24 hr tablet Take 1 tablet (9 mg total) by mouth daily. 30 tablet 2   . polyethylene glycol (MIRALAX / GLYCOLAX) 17 g packet Take 17 g by mouth daily as needed for moderate constipation.     . potassium chloride (KLOR-CON) 10 MEQ tablet Take 2 tablets (20 mEq total) by mouth 2 (two) times daily. with food 180 tablet 5   . propranolol (INDERAL) 10 MG tablet TAKE 1 TABLET BY MOUTH THREE TIMES A DAY. (Patient taking differently: Take 10 mg by mouth 3 (three) times daily.) 90 tablet 5   . RABEprazole (ACIPHEX) 20 MG tablet Take 1 tablet (20 mg total) by mouth 2 (two) times daily before a meal. 60 tablet 11   . RABEprazole (ACIPHEX) 20 MG tablet Take 1 tablet (20 mg total) by mouth 2 (  two) times daily before a meal for 10 days. While taking Pylera, then resume once daily (Patient not taking: Reported on 10/12/2020) 20 tablet 0   . sertraline (ZOLOFT) 100 MG tablet Take 1 tablet (100 mg total) by mouth daily. 30 tablet 2   . torsemide (DEMADEX) 20 MG tablet TAKE (1) TABLET BY MOUTH EACH MORNING. (Patient taking differently: Take 20 mg by mouth in the morning.) 30 tablet 0   . traZODone (DESYREL) 100 MG tablet TAKE 2 TABLETS(200MG) BY MOUTH AT BEDTIME. (Patient taking differently: Take 200 mg by mouth at bedtime.) 60 tablet 2    No current facility-administered medications for this visit.   Allergies  Allergen Reactions  . Thorazine [Chlorpromazine Hcl] Anaphylaxis  . Acetaminophen Other (See Comments)    Makes pt dizzy  . Aspirin Other (See Comments)    seizure  . Aspirin-Acetaminophen-Caffeine Other (See Comments)    seizure  . Nsaids Nausea And Vomiting  . Other     Acidic foods  . Penicillins Nausea And  Vomiting    Has patient had a PCN reaction causing immediate rash, facial/tongue/throat swelling, SOB or lightheadedness with hypotension:Yes Has patient had a PCN reaction causing severe rash involving mucus membranes or skin necrosis:Yes Has patient had a PCN reaction that required hospitalization:Yes Has patient had a PCN reaction occurring within the last 10 years:No If all of the above answers are "NO", then may proceed with Cephalosporin use.   . Tomato Rash    Social History   Tobacco Use  . Smoking status: Current Every Day Smoker    Packs/day: 0.25    Years: 40.00    Pack years: 10.00    Types: Cigarettes  . Smokeless tobacco: Never Used  Substance Use Topics  . Alcohol use: No    Family History  Problem Relation Age of Onset  . Stroke Mother   . Heart attack Father   . Schizophrenia Other   . Drug abuse Other   . Alcohol abuse Other   . Colon cancer Other        aunt  . Obesity Other   . COPD Other   . Anxiety disorder Other   . GER disease Other   . Diabetes type II Other   . Anxiety disorder Other   . Depression Other   . Depression Sister   . Schizophrenia Sister   . Liver disease Neg Hx   . Inflammatory bowel disease Neg Hx      Review of Systems  Musculoskeletal: Positive for arthralgias.  All other systems reviewed and are negative.   Objective:  Physical Exam HENT:     Head: Normocephalic.  Eyes:     Pupils: Pupils are equal, round, and reactive to light.  Cardiovascular:     Rate and Rhythm: Normal rate and regular rhythm.     Pulses: Normal pulses.  Pulmonary:     Effort: Pulmonary effort is normal.  Abdominal:     Palpations: Abdomen is soft.     Tenderness: There is no abdominal tenderness.  Musculoskeletal:        General: Swelling present.     Cervical back: Normal range of motion.  Skin:    General: Skin is warm and dry.  Neurological:     Mental Status: She is alert. Mental status is at baseline.  Psychiatric:        Mood  and Affect: Mood normal.     Vital signs in last 24 hours: _0 @  Labs:  Estimated body mass index is 29.04 kg/m as calculated from the following:   Height as of 07/23/20: 5' 10" (1.778 m).   Weight as of 08/31/20: 91.8 kg.   Imaging Review Plain radiographs demonstrate severe degenerative joint disease of the right knee(s). The bone quality appears to be adequate for age and reported activity level.      Assessment/Plan:  End stage arthritis, bilaterally knee   The patient history, physical examination, clinical judgment of the provider and imaging studies are consistent with end stage degenerative joint disease of the bilaterally knee(s) and total knee arthroplasty is deemed medically necessary. The treatment options including medical management, injection therapy arthroscopy and arthroplasty were discussed at length. The risks and benefits of total knee arthroplasty were presented and reviewed. The risks due to aseptic loosening, infection, stiffness, patella tracking problems, thromboembolic complications and other imponderables were discussed. The patient acknowledged the explanation, agreed to proceed with the plan and consent was signed. Patient is being admitted for inpatient treatment for surgery, pain control, PT, OT, prophylactic antibiotics, VTE prophylaxis, progressive ambulation and ADL's and discharge planning. The patient is planning to be discharged home after overnight observation     Patient's anticipated LOS is less than 2 midnights, meeting these requirements: - Younger than 30 - Lives within 1 hour of care - Has a competent adult at home to recover with post-op recover - NO history of  - Chronic pain requiring opiods  - Diabetes  - Coronary Artery Disease  - Heart failure  - Heart attack  - Stroke  - DVT/VTE  - Cardiac arrhythmia  - Respiratory Failure/COPD  - Renal failure  - Anemia  - Advanced Liver disease

## 2020-10-25 ENCOUNTER — Encounter (HOSPITAL_COMMUNITY): Payer: Self-pay

## 2020-10-25 ENCOUNTER — Other Ambulatory Visit: Payer: Self-pay | Admitting: Family Medicine

## 2020-10-25 ENCOUNTER — Other Ambulatory Visit: Payer: Self-pay

## 2020-10-25 ENCOUNTER — Encounter (HOSPITAL_COMMUNITY)
Admission: RE | Admit: 2020-10-25 | Discharge: 2020-10-25 | Disposition: A | Discharge status: Home / Self Care | Payer: Medicare Other | Source: Ambulatory Visit | Attending: Orthopedic Surgery | Admitting: Orthopedic Surgery

## 2020-10-25 DIAGNOSIS — Z01818 Encounter for other preprocedural examination: Secondary | ICD-10-CM | POA: Diagnosis not present

## 2020-10-25 HISTORY — DX: Cerebral infarction, unspecified: I63.9

## 2020-10-25 HISTORY — DX: Depression, unspecified: F32.A

## 2020-10-25 HISTORY — DX: Unspecified osteoarthritis, unspecified site: M19.90

## 2020-10-25 HISTORY — DX: Cardiac arrhythmia, unspecified: I49.9

## 2020-10-25 HISTORY — DX: Cardiac murmur, unspecified: R01.1

## 2020-10-25 HISTORY — DX: Pneumonia, unspecified organism: J18.9

## 2020-10-25 HISTORY — DX: Gastro-esophageal reflux disease without esophagitis: K21.9

## 2020-10-25 HISTORY — DX: Dyspnea, unspecified: R06.00

## 2020-10-25 LAB — URINALYSIS, ROUTINE W REFLEX MICROSCOPIC
Bilirubin Urine: NEGATIVE
Glucose, UA: NEGATIVE mg/dL
Ketones, ur: NEGATIVE mg/dL
Leukocytes,Ua: NEGATIVE
Nitrite: NEGATIVE
Protein, ur: NEGATIVE mg/dL
Specific Gravity, Urine: 1.003 — ABNORMAL LOW (ref 1.005–1.030)
pH: 5 (ref 5.0–8.0)

## 2020-10-25 LAB — COMPREHENSIVE METABOLIC PANEL
ALT: 10 U/L (ref 0–44)
AST: 19 U/L (ref 15–41)
Albumin: 4 g/dL (ref 3.5–5.0)
Alkaline Phosphatase: 75 U/L (ref 38–126)
Anion gap: 12 (ref 5–15)
BUN: <5 mg/dL — ABNORMAL LOW (ref 6–20)
CO2: 30 mmol/L (ref 22–32)
Calcium: 8.9 mg/dL (ref 8.9–10.3)
Chloride: 97 mmol/L — ABNORMAL LOW (ref 98–111)
Creatinine, Ser: 0.74 mg/dL (ref 0.44–1.00)
GFR, Estimated: >60 mL/min (ref >60)
Glucose, Bld: 87 mg/dL (ref 70–99)
Potassium: 3.3 mmol/L — ABNORMAL LOW (ref 3.5–5.1)
Sodium: 139 mmol/L (ref 135–145)
Total Bilirubin: 0.7 mg/dL (ref 0.3–1.2)
Total Protein: 7.6 g/dL (ref 6.5–8.1)

## 2020-10-25 LAB — CBC
HCT: 40.3 % (ref 36.0–46.0)
Hemoglobin: 13.6 g/dL (ref 12.0–15.0)
MCH: 30.5 pg (ref 26.0–34.0)
MCHC: 33.7 g/dL (ref 30.0–36.0)
MCV: 90.4 fL (ref 80.0–100.0)
Platelets: 191 10*3/uL (ref 150–400)
RBC: 4.46 MIL/uL (ref 3.87–5.11)
RDW: 13.9 % (ref 11.5–15.5)
WBC: 5.2 10*3/uL (ref 4.0–10.5)
nRBC: 0 % (ref 0.0–0.2)

## 2020-10-25 LAB — PROTIME-INR
INR: 1 (ref 0.8–1.2)
Prothrombin Time: 13.4 seconds (ref 11.4–15.2)

## 2020-10-25 LAB — GLUCOSE, CAPILLARY: Glucose-Capillary: 91 mg/dL (ref 70–99)

## 2020-10-25 NOTE — Telephone Encounter (Signed)
6 months each

## 2020-10-25 NOTE — Progress Notes (Addendum)
Anesthesia Review:  PCP: DR Sallee Lange Clear[place] 10/15/20 on chart.  LOV 08/31/20 on chart  Cardiologist : 01/29/20- LOV with Dr Servando Snare - for pulmonary nodule  Chest x-ray : CT chest 01/29/20  EKG : 10/25/20 Echo : 2020  Stress test: Cardiac Cath :  Activity level: no  Sleep Study/ CPAP :does not use cpap  Fasting Blood Sugar :      / Checks Blood Sugar -- times a day:   Blood Thinner/ Instructions /Last Dose: ASA / Instructions/ Last Dose :  1/7/22hgba1c-6.5.  10/25/20-6.0 Mental retardation- does not check glucose at home.   DM- type 2 - on no meds  Has caseworker and aide that comes 5 days per week along with boyfriend who checks on frequently   Has appt on 10/27/20 for Vascular Studies to check pulse in feet .   CMP and U/A done 10/25/20 routed to Dr Lyla Glassing.

## 2020-10-26 LAB — SURGICAL PCR SCREEN
MRSA, PCR: POSITIVE — AB
Staphylococcus aureus: POSITIVE — AB

## 2020-10-26 LAB — HEMOGLOBIN A1C
Hgb A1c MFr Bld: 6 % — ABNORMAL HIGH (ref 4.8–5.6)
Mean Plasma Glucose: 126 mg/dL

## 2020-10-27 ENCOUNTER — Other Ambulatory Visit: Payer: Self-pay

## 2020-10-27 ENCOUNTER — Ambulatory Visit (HOSPITAL_COMMUNITY)
Admission: RE | Admit: 2020-10-27 | Discharge: 2020-10-27 | Disposition: A | Discharge status: Home / Self Care | Payer: Medicare Other | Source: Ambulatory Visit | Attending: Podiatry | Admitting: Podiatry

## 2020-10-27 DIAGNOSIS — R0989 Other specified symptoms and signs involving the circulatory and respiratory systems: Secondary | ICD-10-CM | POA: Diagnosis not present

## 2020-10-27 DIAGNOSIS — E1142 Type 2 diabetes mellitus with diabetic polyneuropathy: Secondary | ICD-10-CM | POA: Diagnosis not present

## 2020-10-28 NOTE — Anesthesia Preprocedure Evaluation (Addendum)
Anesthesia Evaluation  Patient identified by MRN, date of birth, ID band Patient awake    Reviewed: Allergy & Precautions, NPO status , Patient's Chart, lab work & pertinent test results  Airway Mallampati: II  TM Distance: >3 FB Neck ROM: Full    Dental  (+) Edentulous Upper, Edentulous Lower   Pulmonary sleep apnea , Current Smoker and Patient abstained from smoking.,    Pulmonary exam normal        Cardiovascular hypertension, Pt. on medications and Pt. on home beta blockers + Past MI   Rhythm:Regular Rate:Normal     Neuro/Psych  Headaches, Anxiety Depression Cognitive impairment CVA    GI/Hepatic Neg liver ROS, GERD  Medicated,  Endo/Other  diabetes  Renal/GU negative Renal ROS     Musculoskeletal  (+) Arthritis , Osteoarthritis,    Abdominal (+)  Abdomen: soft. Bowel sounds: normal.  Peds  Hematology negative hematology ROS (+)   Anesthesia Other Findings   Reproductive/Obstetrics                           Anesthesia Physical Anesthesia Plan  ASA: III  Anesthesia Plan: General and Regional   Post-op Pain Management:  Regional for Post-op pain   Induction: Intravenous  PONV Risk Score and Plan: 2 and Ondansetron and Dexamethasone  Airway Management Planned: Mask and LMA  Additional Equipment: None  Intra-op Plan:   Post-operative Plan: Extubation in OR  Informed Consent: I have reviewed the patients History and Physical, chart, labs and discussed the procedure including the risks, benefits and alternatives for the proposed anesthesia with the patient or authorized representative who has indicated his/her understanding and acceptance.     Dental advisory given  Plan Discussed with: CRNA  Anesthesia Plan Comments: (See APP note by Durel Salts, FNP  Lab Results      Component                Value               Date                      WBC                      5.2                  10/25/2020                HGB                      13.6                10/25/2020                HCT                      40.3                10/25/2020                MCV                      90.4                10/25/2020                PLT  191                 10/25/2020           Lab Results      Component                Value               Date                      NA                       139                 10/25/2020                K                        3.3 (L)             10/25/2020                CO2                      30                  10/25/2020                GLUCOSE                  87                  10/25/2020                BUN                      <5 (L)              10/25/2020                CREATININE               0.74                10/25/2020                CALCIUM                  8.9                 10/25/2020                GFRNONAA                 >60                 10/25/2020                GFRAA                    92                  07/16/2020            Echo 03/24/19:  1. The left ventricle has low normal systolic function, with an ejection fraction of 50-55%. The cavity size was normal. Left ventricular diastolic Doppler parameters are consistent with pseudonormalization. Indeterminate filling pressures No evidence of left ventricular regional wall motion  abnormalities.  2. The mitral valve is grossly normal.  3. The tricuspid valve is grossly normal.  4. The aortic valve was not well visualized.  5. The aorta is normal unless otherwise noted.  6. The inferior vena cava was dilated in size with >50% respiratory variability. )      Anesthesia Quick Evaluation

## 2020-10-28 NOTE — Progress Notes (Signed)
Anesthesia Chart Review:   Case: [id number] Date/Time: 11/04/20 1030   Procedure: COMPUTER ASSISTED TOTAL KNEE ARTHROPLASTY (Right Knee)   Anesthesia type: Spinal   Pre-op diagnosis: right knee degenerative joint disease   Location: [facility] ROOM 08 / [facility]   Surgeons: Rod Can, MD      DISCUSSION: Pt is 60 years old with hx HTN, DM, stroke, OSA, lung nodules (stable on CT 01/2020), heart murmur (no valve issues on 2020 echo), mild cognitive impairment. Current smoker.    VS: BP 111/69   Pulse 66   Temp 36.8 C (Oral)   Resp 16   Ht 5' 10" (1.778 m)   SpO2 100%   BMI 29.04 kg/m    PROVIDERS: - PCP is Kathyrn Drown, MD. Comment in 08/31/20 documents "From a overall physical condition I believe that she would be capable of going through the knee replacement surgery if they decide to do so"   LABS: Labs reviewed: Acceptable for surgery. (all labs ordered are listed, but only abnormal results are displayed)  Labs Reviewed  SURGICAL PCR SCREEN - Abnormal; Notable for the following components:      Result Value   MRSA, PCR POSITIVE (*)    Staphylococcus aureus POSITIVE (*)    All other components within normal limits  HEMOGLOBIN A1C - Abnormal; Notable for the following components:   Hgb A1c MFr Bld 6.0 (*)    All other components within normal limits  COMPREHENSIVE METABOLIC PANEL - Abnormal; Notable for the following components:   Potassium 3.3 (*)    Chloride 97 (*)    BUN <5 (*)    All other components within normal limits  URINALYSIS, ROUTINE W REFLEX MICROSCOPIC - Abnormal; Notable for the following components:   Color, Urine COLORLESS (*)    Specific Gravity, Urine 1.003 (*)    Hgb urine dipstick SMALL (*)    Bacteria, UA RARE (*)    All other components within normal limits  CBC  PROTIME-INR  GLUCOSE, CAPILLARY  TYPE AND SCREEN     IMAGES: CT chest 01/29/20:  1. Similar-appearing 1.8 cm right middle lobe nodule when compared with recent prior exams. This  is increased in size when compared with more remote prior exams. As mentioned on prior exam, slow growing indolent neoplastic process is not entirely excluded.  2. Aortic atherosclerosis.   EKG 10/25/20: NSR   CV: ABI 10/27/20:  - Right: Resting right ankle-brachial index is within normal range. No evidence of significant right lower extremity arterial disease. The right toe-brachial index is normal.  - Left: Resting left ankle-brachial index is within normal range. No evidence of significant left lower extremity arterial disease. The left toe-brachial index is normal.  Echo 03/24/19:  1. The left ventricle has low normal systolic function, with an ejection fraction of 50-55%. The cavity size was normal. Left ventricular diastolic Doppler parameters are consistent with pseudonormalization. Indeterminate filling pressures No evidence of left ventricular regional wall motion abnormalities.  2. The mitral valve is grossly normal.  3. The tricuspid valve is grossly normal.  4. The aortic valve was not well visualized.  5. The aorta is normal unless otherwise noted.  6. The inferior vena cava was dilated in size with >50% respiratory variability.    Past Medical History:  Diagnosis Date  . Anxiety   . Aortic atherosclerosis ([place]) 09/19/2020   Seen on CAT scan 2021  . Arthritis   . Depression   . Diabetes mellitus   . Diabetes  mellitus, type II ([place])   . Dyspnea   . Dysrhythmia   . GERD (gastroesophageal reflux disease)   . Heart murmur   . HTN (hypertension)   . Hyperglycemia   . IBS (irritable bowel syndrome)   . Lung nodules    right, followed by PCP, PET 11/2011  . Mental retardation   . MI (myocardial infarction) ([place])   . Migraines   . Pneumonia   . Sleep apnea    cpap   . Stroke [facility])     Past Surgical History:  Procedure Laterality Date  . ABDOMINAL HYSTERECTOMY    . BIOPSY  01/15/2020   Procedure: BIOPSY;  Surgeon: Daneil Dolin, MD;  [facility];   Service: Endoscopy;;  gastric  . COLONOSCOPY  11/2007   hyperplastic polyps, prior hx of adenomas   . COLONOSCOPY  05/2010   incomplete due to poor prep, hyperplastic rectal polyp  . COLONOSCOPY  05/05/2002   Dimunitive polyps in the rectum and left colon, cold    biopsied/removed.  Scattered few left-sided diverticula.  Regular colonic   mucosa appeared normal  . COLONOSCOPY N/A 05/28/2013   Rourk: mulitple tubular adenomas removed. next tcs 05/2016  . COLONOSCOPY WITH PROPOFOL N/A 09/07/2016   Dr. Gala Romney: For hyperplastic polyps removed. Next colonoscopy March 2023 given history of adenomatous colon polyps in the past.  . ESOPHAGOGASTRODUODENOSCOPY  08/2007   moderate sized hiatal hernia  . ESOPHAGOGASTRODUODENOSCOPY  05/2010   noncritical schatzki ring s/p 63F  . ESOPHAGOGASTRODUODENOSCOPY (EGD) WITH ESOPHAGEAL DILATION N/A 02/06/2013   HTD:SKAJGO esophagus-s/p dilation up to a 85 [place] size with [facility] dilators.  Hiatal hernia  . ESOPHAGOGASTRODUODENOSCOPY (EGD) WITH PROPOFOL N/A 09/07/2016   Dr. Gala Romney: Normal, status post empiric dilation of the esophagus for history of dysphagia  . ESOPHAGOGASTRODUODENOSCOPY (EGD) WITH PROPOFOL N/A 05/02/2019   Normal esophagus s/p dilation, normal stomach, normal duodenum  . ESOPHAGOGASTRODUODENOSCOPY (EGD) WITH PROPOFOL N/A 01/15/2020   normal esophagus s/p dilation, gastric nodule s/p biopsy. This showed reactive gastropathy with H.pylori.   Marland Kitchen EXTERNAL EAR SURGERY     bilateral  . FOOT SURGERY    . GLAUCOMA SURGERY    . MALONEY DILATION N/A 09/07/2016   Procedure: Venia Minks DILATION;  Surgeon: Daneil Dolin, MD;  [facility];  Service: Endoscopy;  Laterality: N/A;  . Venia Minks DILATION N/A 05/02/2019   Procedure: Venia Minks DILATION;  Surgeon: Daneil Dolin, MD;  [facility];  Service: Endoscopy;  Laterality: N/A;  . Venia Minks DILATION N/A 01/15/2020   Procedure: Venia Minks DILATION;  Surgeon: Daneil Dolin, MD;  [facility];   Service: Endoscopy;  Laterality: N/A;  . POLYPECTOMY  09/07/2016   Procedure: POLYPECTOMY;  Surgeon: Daneil Dolin, MD;  [facility];  Service: Endoscopy;;  sigmoid colon x4  . small bowel capsule  10/2007   normal    MEDICATIONS: . atorvastatin (LIPITOR) 10 MG tablet  . benztropine (COGENTIN) 1 MG tablet  . bismuth-metronidazole-tetracycline (PYLERA) 140-125-125 MG capsule  . blood glucose meter kit and supplies  . cycloSPORINE (RESTASIS) 0.05 % ophthalmic emulsion  . dicyclomine (BENTYL) 10 MG capsule  . lamoTRIgine (LAMICTAL) 100 MG tablet  . paliperidone (INVEGA) 9 MG 24 hr tablet  . polyethylene glycol (MIRALAX / GLYCOLAX) 17 g packet  . potassium chloride (KLOR-CON) 10 MEQ tablet  . propranolol (INDERAL) 10 MG tablet  . RABEprazole (ACIPHEX) 20 MG tablet  . RABEprazole (ACIPHEX) 20 MG tablet  . sertraline (  ZOLOFT) 100 MG tablet  . torsemide (DEMADEX) 20 MG tablet  . traZODone (DESYREL) 100 MG tablet   No current facility-administered medications for this encounter.    If no changes, I anticipate pt can proceed with surgery as scheduled.   Willeen Cass, PhD, FNP-BC [facility]/Anesthesiology Phone: [phone] 10/28/2020 2:51 PM

## 2020-11-01 ENCOUNTER — Other Ambulatory Visit (HOSPITAL_COMMUNITY)
Admission: RE | Admit: 2020-11-01 | Discharge: 2020-11-01 | Disposition: A | Discharge status: Home / Self Care | Payer: Medicare Other | Source: Ambulatory Visit | Attending: Orthopedic Surgery | Admitting: Orthopedic Surgery

## 2020-11-01 ENCOUNTER — Other Ambulatory Visit (HOSPITAL_COMMUNITY): Payer: Medicare Other

## 2020-11-01 ENCOUNTER — Other Ambulatory Visit: Payer: Medicare Other

## 2020-11-01 DIAGNOSIS — Z01812 Encounter for preprocedural laboratory examination: Secondary | ICD-10-CM | POA: Insufficient documentation

## 2020-11-01 DIAGNOSIS — Z20822 Contact with and (suspected) exposure to covid-19: Secondary | ICD-10-CM | POA: Diagnosis not present

## 2020-11-02 LAB — SARS CORONAVIRUS 2 (TAT 6-24 HRS): SARS Coronavirus 2: NEGATIVE

## 2020-11-04 ENCOUNTER — Encounter (HOSPITAL_COMMUNITY): Payer: Self-pay | Admitting: Orthopedic Surgery

## 2020-11-04 ENCOUNTER — Ambulatory Visit (HOSPITAL_COMMUNITY): Payer: Medicare Other

## 2020-11-04 ENCOUNTER — Ambulatory Visit (HOSPITAL_COMMUNITY): Payer: Medicare Other | Admitting: Anesthesiology

## 2020-11-04 ENCOUNTER — Encounter (HOSPITAL_COMMUNITY)
Admission: RE | Disposition: A | Discharge status: Home / Self Care | Payer: Self-pay | Source: Home / Self Care | Attending: Orthopedic Surgery

## 2020-11-04 ENCOUNTER — Observation Stay (HOSPITAL_COMMUNITY)
Admission: RE | Admit: 2020-11-04 | Discharge: 2020-11-10 | Disposition: A | Discharge status: Home / Self Care | Payer: Medicare Other | Attending: Orthopedic Surgery | Admitting: Orthopedic Surgery

## 2020-11-04 ENCOUNTER — Ambulatory Visit (HOSPITAL_COMMUNITY): Payer: Medicare Other | Admitting: Emergency Medicine

## 2020-11-04 ENCOUNTER — Other Ambulatory Visit: Payer: Self-pay

## 2020-11-04 DIAGNOSIS — F17210 Nicotine dependence, cigarettes, uncomplicated: Secondary | ICD-10-CM | POA: Insufficient documentation

## 2020-11-04 DIAGNOSIS — I10 Essential (primary) hypertension: Secondary | ICD-10-CM | POA: Diagnosis not present

## 2020-11-04 DIAGNOSIS — E119 Type 2 diabetes mellitus without complications: Secondary | ICD-10-CM | POA: Insufficient documentation

## 2020-11-04 DIAGNOSIS — Z20822 Contact with and (suspected) exposure to covid-19: Secondary | ICD-10-CM | POA: Insufficient documentation

## 2020-11-04 DIAGNOSIS — Z79899 Other long term (current) drug therapy: Secondary | ICD-10-CM | POA: Insufficient documentation

## 2020-11-04 DIAGNOSIS — G43909 Migraine, unspecified, not intractable, without status migrainosus: Secondary | ICD-10-CM | POA: Diagnosis not present

## 2020-11-04 DIAGNOSIS — M1711 Unilateral primary osteoarthritis, right knee: Principal | ICD-10-CM | POA: Diagnosis present

## 2020-11-04 DIAGNOSIS — Z96651 Presence of right artificial knee joint: Secondary | ICD-10-CM | POA: Diagnosis not present

## 2020-11-04 DIAGNOSIS — Z7984 Long term (current) use of oral hypoglycemic drugs: Secondary | ICD-10-CM | POA: Insufficient documentation

## 2020-11-04 DIAGNOSIS — Z9889 Other specified postprocedural states: Secondary | ICD-10-CM | POA: Diagnosis not present

## 2020-11-04 DIAGNOSIS — G8918 Other acute postprocedural pain: Secondary | ICD-10-CM | POA: Diagnosis not present

## 2020-11-04 DIAGNOSIS — Z471 Aftercare following joint replacement surgery: Secondary | ICD-10-CM | POA: Diagnosis not present

## 2020-11-04 DIAGNOSIS — R600 Localized edema: Secondary | ICD-10-CM | POA: Diagnosis not present

## 2020-11-04 HISTORY — PX: KNEE ARTHROPLASTY: SHX992

## 2020-11-04 LAB — TYPE AND SCREEN
ABO/RH(D): B POS
Antibody Screen: POSITIVE
PT AG Type: NEGATIVE
Unit division: 0
Unit division: 0

## 2020-11-04 LAB — GLUCOSE, CAPILLARY
Glucose-Capillary: 104 mg/dL — ABNORMAL HIGH (ref 70–99)
Glucose-Capillary: 131 mg/dL — ABNORMAL HIGH (ref 70–99)
Glucose-Capillary: 101 mg/dL — ABNORMAL HIGH (ref 70–99)

## 2020-11-04 LAB — BPAM RBC
Blood Product Expiration Date: 202205142359
Blood Product Expiration Date: 202205242359
Unit Type and Rh: 5100
Unit Type and Rh: 7300

## 2020-11-04 SURGERY — ARTHROPLASTY, KNEE, TOTAL, USING IMAGELESS COMPUTER-ASSISTED NAVIGATION
Anesthesia: Regional | Site: Knee | Laterality: Right

## 2020-11-04 MED ORDER — POVIDONE-IODINE 10 % EX SWAB
2.0000 "application " | Freq: Once | CUTANEOUS | Status: AC
  Administered 2020-11-04: 2 via TOPICAL

## 2020-11-04 MED ORDER — PALIPERIDONE ER 6 MG PO TB24
9.0000 mg | ORAL_TABLET | Freq: Every day | ORAL | Status: DC
  Administered 2020-11-04 – 2020-11-10 (×7): 9 mg via ORAL
  Filled 2020-11-04 (×7): qty 1

## 2020-11-04 MED ORDER — BUPIVACAINE-EPINEPHRINE (PF) 0.25% -1:200000 IJ SOLN
INTRAMUSCULAR | Status: AC
  Filled 2020-11-04: qty 30

## 2020-11-04 MED ORDER — VANCOMYCIN HCL IN DEXTROSE 1-5 GM/200ML-% IV SOLN
INTRAVENOUS | Status: AC
  Administered 2020-11-04: 1000 mg via INTRAVENOUS
  Filled 2020-11-04: qty 200

## 2020-11-04 MED ORDER — OXYCODONE HCL 5 MG PO TABS
10.0000 mg | ORAL_TABLET | ORAL | Status: DC | PRN
  Administered 2020-11-10: 10 mg via ORAL
  Filled 2020-11-04: qty 2

## 2020-11-04 MED ORDER — OXYCODONE HCL 5 MG PO TABS: 5.0000 mg | ORAL_TABLET | Freq: Once | ORAL | Status: DC | PRN

## 2020-11-04 MED ORDER — OXYCODONE HCL 5 MG PO TABS
5.0000 mg | ORAL_TABLET | ORAL | Status: DC | PRN
  Administered 2020-11-06: 5 mg via ORAL
  Filled 2020-11-04: qty 1

## 2020-11-04 MED ORDER — LIDOCAINE 2% (20 MG/ML) 5 ML SYRINGE
INTRAMUSCULAR | Status: DC | PRN
  Administered 2020-11-04: 80 mg via INTRAVENOUS

## 2020-11-04 MED ORDER — APIXABAN 2.5 MG PO TABS
2.5000 mg | ORAL_TABLET | Freq: Two times a day (BID) | ORAL | Status: DC
  Administered 2020-11-05 – 2020-11-10 (×11): 2.5 mg via ORAL
  Filled 2020-11-04 (×11): qty 1

## 2020-11-04 MED ORDER — LAMOTRIGINE 100 MG PO TABS
100.0000 mg | ORAL_TABLET | Freq: Two times a day (BID) | ORAL | Status: DC
  Administered 2020-11-04 – 2020-11-10 (×12): 100 mg via ORAL
  Filled 2020-11-04 (×12): qty 1

## 2020-11-04 MED ORDER — SERTRALINE HCL 100 MG PO TABS
100.0000 mg | ORAL_TABLET | Freq: Every day | ORAL | Status: DC
  Administered 2020-11-04 – 2020-11-10 (×7): 100 mg via ORAL
  Filled 2020-11-04 (×7): qty 1

## 2020-11-04 MED ORDER — POLYETHYLENE GLYCOL 3350 17 G PO PACK: 17.0000 g | PACK | Freq: Every day | ORAL | Status: DC | PRN

## 2020-11-04 MED ORDER — PROPOFOL 10 MG/ML IV BOLUS
INTRAVENOUS | Status: DC | PRN
  Administered 2020-11-04: 170 mg via INTRAVENOUS

## 2020-11-04 MED ORDER — ORAL CARE MOUTH RINSE
15.0000 mL | Freq: Once | OROMUCOSAL | Status: AC
  Administered 2020-11-04: 15 mL via OROMUCOSAL

## 2020-11-04 MED ORDER — MENTHOL 3 MG MT LOZG: 1.0000 | LOZENGE | OROMUCOSAL | Status: DC | PRN

## 2020-11-04 MED ORDER — FENTANYL CITRATE (PF) 100 MCG/2ML IJ SOLN
50.0000 ug | Freq: Once | INTRAMUSCULAR | Status: AC
  Administered 2020-11-04: 50 ug via INTRAVENOUS
  Filled 2020-11-04: qty 2

## 2020-11-04 MED ORDER — METHOCARBAMOL 500 MG PO TABS
500.0000 mg | ORAL_TABLET | Freq: Four times a day (QID) | ORAL | Status: DC | PRN
  Administered 2020-11-05 – 2020-11-10 (×2): 500 mg via ORAL
  Filled 2020-11-04 (×2): qty 1

## 2020-11-04 MED ORDER — KETOROLAC TROMETHAMINE 30 MG/ML IJ SOLN
INTRAMUSCULAR | Status: AC
  Filled 2020-11-04: qty 1

## 2020-11-04 MED ORDER — BUPIVACAINE-EPINEPHRINE 0.25% -1:200000 IJ SOLN
INTRAMUSCULAR | Status: DC | PRN
  Administered 2020-11-04: 30 mL

## 2020-11-04 MED ORDER — METOCLOPRAMIDE HCL 5 MG PO TABS: 5.0000 mg | ORAL_TABLET | Freq: Three times a day (TID) | ORAL | Status: DC | PRN

## 2020-11-04 MED ORDER — PROPRANOLOL HCL 10 MG PO TABS
10.0000 mg | ORAL_TABLET | Freq: Three times a day (TID) | ORAL | Status: DC
  Administered 2020-11-04 – 2020-11-10 (×15): 10 mg via ORAL
  Filled 2020-11-04 (×19): qty 1

## 2020-11-04 MED ORDER — PROPOFOL 500 MG/50ML IV EMUL
INTRAVENOUS | Status: AC
  Filled 2020-11-04: qty 50

## 2020-11-04 MED ORDER — POTASSIUM CHLORIDE CRYS ER 10 MEQ PO TBCR
20.0000 meq | EXTENDED_RELEASE_TABLET | Freq: Two times a day (BID) | ORAL | Status: DC
  Administered 2020-11-04 – 2020-11-10 (×12): 20 meq via ORAL
  Filled 2020-11-04 (×12): qty 2

## 2020-11-04 MED ORDER — SODIUM CHLORIDE 0.9 % IR SOLN
Status: DC | PRN
  Administered 2020-11-04: 1000 mL

## 2020-11-04 MED ORDER — FENTANYL CITRATE (PF) 100 MCG/2ML IJ SOLN
INTRAMUSCULAR | Status: DC | PRN
  Administered 2020-11-04 (×5): 50 ug via INTRAVENOUS

## 2020-11-04 MED ORDER — DEXAMETHASONE SODIUM PHOSPHATE 10 MG/ML IJ SOLN
10.0000 mg | Freq: Once | INTRAMUSCULAR | Status: AC
  Administered 2020-11-05: 10 mg via INTRAVENOUS
  Filled 2020-11-04: qty 1

## 2020-11-04 MED ORDER — SODIUM CHLORIDE (PF) 0.9 % IJ SOLN
INTRAMUSCULAR | Status: AC
  Filled 2020-11-04: qty 30

## 2020-11-04 MED ORDER — SODIUM CHLORIDE 0.9% FLUSH
INTRAVENOUS | Status: DC | PRN
  Administered 2020-11-04: 30 mL

## 2020-11-04 MED ORDER — MIDAZOLAM HCL 2 MG/2ML IJ SOLN
1.0000 mg | Freq: Once | INTRAMUSCULAR | Status: DC
  Filled 2020-11-04: qty 2

## 2020-11-04 MED ORDER — DEXAMETHASONE SODIUM PHOSPHATE 10 MG/ML IJ SOLN
INTRAMUSCULAR | Status: DC | PRN
  Administered 2020-11-04: 5 mg via INTRAVENOUS

## 2020-11-04 MED ORDER — PHENOL 1.4 % MT LIQD
1.0000 | OROMUCOSAL | Status: DC | PRN
Start: 2020-11-04 — End: 2020-11-10

## 2020-11-04 MED ORDER — METOCLOPRAMIDE HCL 5 MG/ML IJ SOLN: 5.0000 mg | Freq: Three times a day (TID) | INTRAMUSCULAR | Status: DC | PRN

## 2020-11-04 MED ORDER — CLINDAMYCIN PHOSPHATE 900 MG/50ML IV SOLN
900.0000 mg | INTRAVENOUS | Status: AC
  Administered 2020-11-04: 900 mg via INTRAVENOUS
  Filled 2020-11-04: qty 50

## 2020-11-04 MED ORDER — ACETAMINOPHEN 10 MG/ML IV SOLN: 1000.0000 mg | Freq: Once | INTRAVENOUS | Status: DC

## 2020-11-04 MED ORDER — ISOPROPYL ALCOHOL 70 % SOLN
Status: DC | PRN
  Administered 2020-11-04: 1 via TOPICAL

## 2020-11-04 MED ORDER — STERILE WATER FOR IRRIGATION IR SOLN
Status: DC | PRN
  Administered 2020-11-04: 2000 mL

## 2020-11-04 MED ORDER — LACTATED RINGERS IV SOLN: INTRAVENOUS | Status: DC

## 2020-11-04 MED ORDER — OXYCODONE HCL 5 MG/5ML PO SOLN: 5.0000 mg | Freq: Once | ORAL | Status: DC | PRN

## 2020-11-04 MED ORDER — ONDANSETRON HCL 4 MG/2ML IJ SOLN: 4.0000 mg | Freq: Four times a day (QID) | INTRAMUSCULAR | Status: DC | PRN

## 2020-11-04 MED ORDER — LIDOCAINE 2% (20 MG/ML) 5 ML SYRINGE
INTRAMUSCULAR | Status: AC
  Filled 2020-11-04: qty 5

## 2020-11-04 MED ORDER — SODIUM CHLORIDE (PF) 0.9 % IJ SOLN
INTRAMUSCULAR | Status: DC | PRN
  Administered 2020-11-04: 50 mL

## 2020-11-04 MED ORDER — INSULIN ASPART 100 UNIT/ML IJ SOLN
0.0000 [IU] | Freq: Three times a day (TID) | INTRAMUSCULAR | Status: DC
  Administered 2020-11-04 – 2020-11-07 (×2): 1 [IU] via SUBCUTANEOUS

## 2020-11-04 MED ORDER — ROPIVACAINE HCL 7.5 MG/ML IJ SOLN
INTRAMUSCULAR | Status: DC | PRN
  Administered 2020-11-04: 20 mL via PERINEURAL

## 2020-11-04 MED ORDER — EPHEDRINE SULFATE-NACL 50-0.9 MG/10ML-% IV SOSY
PREFILLED_SYRINGE | INTRAVENOUS | Status: DC | PRN
  Administered 2020-11-04: 10 mg via INTRAVENOUS

## 2020-11-04 MED ORDER — DIPHENHYDRAMINE HCL 12.5 MG/5ML PO ELIX: 12.5000 mg | ORAL_SOLUTION | ORAL | Status: DC | PRN

## 2020-11-04 MED ORDER — ONDANSETRON HCL 4 MG/2ML IJ SOLN
INTRAMUSCULAR | Status: DC | PRN
  Administered 2020-11-04: 4 mg via INTRAVENOUS

## 2020-11-04 MED ORDER — INSULIN ASPART 100 UNIT/ML IJ SOLN: 0.0000 [IU] | Freq: Every day | INTRAMUSCULAR | Status: DC

## 2020-11-04 MED ORDER — CYCLOSPORINE 0.05 % OP EMUL
1.0000 [drp] | Freq: Two times a day (BID) | OPHTHALMIC | Status: DC
  Administered 2020-11-04 – 2020-11-10 (×12): 1 [drp] via OPHTHALMIC
  Filled 2020-11-04 (×12): qty 1

## 2020-11-04 MED ORDER — 0.9 % SODIUM CHLORIDE (POUR BTL) OPTIME
TOPICAL | Status: DC | PRN
  Administered 2020-11-04: 1000 mL

## 2020-11-04 MED ORDER — SODIUM CHLORIDE 0.9 % IV SOLN: INTRAVENOUS | Status: DC

## 2020-11-04 MED ORDER — VANCOMYCIN HCL 1000 MG/200ML IV SOLN
1000.0000 mg | Freq: Two times a day (BID) | INTRAVENOUS | Status: AC
  Administered 2020-11-04: 1000 mg via INTRAVENOUS
  Filled 2020-11-04: qty 200

## 2020-11-04 MED ORDER — CHLORHEXIDINE GLUCONATE 0.12 % MT SOLN: 15.0000 mL | Freq: Once | OROMUCOSAL | Status: AC

## 2020-11-04 MED ORDER — PANTOPRAZOLE SODIUM 40 MG PO TBEC
40.0000 mg | DELAYED_RELEASE_TABLET | Freq: Every day | ORAL | Status: DC
  Administered 2020-11-04 – 2020-11-10 (×7): 40 mg via ORAL
  Filled 2020-11-04 (×7): qty 1

## 2020-11-04 MED ORDER — TRANEXAMIC ACID-NACL 1000-0.7 MG/100ML-% IV SOLN
1000.0000 mg | INTRAVENOUS | Status: AC
  Administered 2020-11-04: 1000 mg via INTRAVENOUS
  Filled 2020-11-04: qty 100

## 2020-11-04 MED ORDER — VANCOMYCIN HCL IN DEXTROSE 1-5 GM/200ML-% IV SOLN
1000.0000 mg | INTRAVENOUS | Status: AC
  Administered 2020-11-04: 200 mg via INTRAVENOUS

## 2020-11-04 MED ORDER — HYDROMORPHONE HCL 1 MG/ML IJ SOLN: 0.5000 mg | INTRAMUSCULAR | Status: DC | PRN

## 2020-11-04 MED ORDER — SENNA 8.6 MG PO TABS
1.0000 | ORAL_TABLET | Freq: Two times a day (BID) | ORAL | Status: DC
  Administered 2020-11-04 – 2020-11-10 (×12): 8.6 mg via ORAL
  Filled 2020-11-04 (×12): qty 1

## 2020-11-04 MED ORDER — OXYCODONE HCL ER 10 MG PO T12A
10.0000 mg | EXTENDED_RELEASE_TABLET | Freq: Two times a day (BID) | ORAL | Status: DC
  Administered 2020-11-04 – 2020-11-10 (×12): 10 mg via ORAL
  Filled 2020-11-04 (×12): qty 1

## 2020-11-04 MED ORDER — POTASSIUM CHLORIDE ER 10 MEQ PO TBCR
20.0000 meq | EXTENDED_RELEASE_TABLET | Freq: Two times a day (BID) | ORAL | Status: DC
  Filled 2020-11-04: qty 2

## 2020-11-04 MED ORDER — ONDANSETRON HCL 4 MG PO TABS: 4.0000 mg | ORAL_TABLET | Freq: Four times a day (QID) | ORAL | Status: DC | PRN

## 2020-11-04 MED ORDER — DICYCLOMINE HCL 10 MG PO CAPS
10.0000 mg | ORAL_CAPSULE | Freq: Three times a day (TID) | ORAL | Status: DC
  Administered 2020-11-04 – 2020-11-10 (×16): 10 mg via ORAL
  Filled 2020-11-04 (×19): qty 1

## 2020-11-04 MED ORDER — FENTANYL CITRATE (PF) 100 MCG/2ML IJ SOLN
INTRAMUSCULAR | Status: AC
  Filled 2020-11-04: qty 2

## 2020-11-04 MED ORDER — DEXAMETHASONE SODIUM PHOSPHATE 10 MG/ML IJ SOLN
INTRAMUSCULAR | Status: DC | PRN
  Administered 2020-11-04: 5 mg

## 2020-11-04 MED ORDER — METHOCARBAMOL 500 MG IVPB - SIMPLE MED
500.0000 mg | Freq: Four times a day (QID) | INTRAVENOUS | Status: DC | PRN
  Filled 2020-11-04: qty 50

## 2020-11-04 MED ORDER — KETOROLAC TROMETHAMINE 30 MG/ML IJ SOLN
INTRAMUSCULAR | Status: DC | PRN
  Administered 2020-11-04: 30 mg

## 2020-11-04 MED ORDER — DOCUSATE SODIUM 100 MG PO CAPS
100.0000 mg | ORAL_CAPSULE | Freq: Two times a day (BID) | ORAL | Status: DC
  Administered 2020-11-04 – 2020-11-10 (×11): 100 mg via ORAL
  Filled 2020-11-04 (×12): qty 1

## 2020-11-04 MED ORDER — FENTANYL CITRATE (PF) 250 MCG/5ML IJ SOLN
INTRAMUSCULAR | Status: AC
  Filled 2020-11-04: qty 5

## 2020-11-04 MED ORDER — FENTANYL CITRATE (PF) 100 MCG/2ML IJ SOLN: 25.0000 ug | INTRAMUSCULAR | Status: DC | PRN

## 2020-11-04 MED ORDER — TORSEMIDE 20 MG PO TABS
20.0000 mg | ORAL_TABLET | Freq: Every day | ORAL | Status: DC
  Administered 2020-11-05 – 2020-11-10 (×5): 20 mg via ORAL
  Filled 2020-11-04 (×6): qty 1

## 2020-11-04 MED ORDER — TRAZODONE HCL 100 MG PO TABS
200.0000 mg | ORAL_TABLET | Freq: Every day | ORAL | Status: DC
  Administered 2020-11-04 – 2020-11-09 (×5): 200 mg via ORAL
  Filled 2020-11-04 (×7): qty 2

## 2020-11-04 MED ORDER — ATORVASTATIN CALCIUM 10 MG PO TABS
10.0000 mg | ORAL_TABLET | Freq: Every day | ORAL | Status: DC
  Administered 2020-11-04 – 2020-11-10 (×7): 10 mg via ORAL
  Filled 2020-11-04 (×7): qty 1

## 2020-11-04 MED ORDER — BENZTROPINE MESYLATE 0.5 MG PO TABS
1.0000 mg | ORAL_TABLET | Freq: Every day | ORAL | Status: DC
  Administered 2020-11-04 – 2020-11-10 (×7): 1 mg via ORAL
  Filled 2020-11-04 (×7): qty 2

## 2020-11-04 MED ORDER — DEXAMETHASONE SODIUM PHOSPHATE 10 MG/ML IJ SOLN
INTRAMUSCULAR | Status: AC
  Filled 2020-11-04: qty 1

## 2020-11-04 MED ORDER — EPHEDRINE 5 MG/ML INJ
INTRAVENOUS | Status: AC
  Filled 2020-11-04: qty 10

## 2020-11-04 MED ORDER — ALUM & MAG HYDROXIDE-SIMETH 200-200-20 MG/5ML PO SUSP: 30.0000 mL | ORAL | Status: DC | PRN

## 2020-11-04 SURGICAL SUPPLY — 77 items
ADH SKN CLS APL DERMABOND .7 (GAUZE/BANDAGES/DRESSINGS) ×2
APL PRP STRL LF DISP 70% ISPRP (MISCELLANEOUS) ×2
BAG SPEC THK2 15X12 ZIP CLS (MISCELLANEOUS)
BAG ZIPLOCK 12X15 (MISCELLANEOUS) IMPLANT
BATTERY INSTRU NAVIGATION (MISCELLANEOUS) ×6 IMPLANT
BEARING TIBIA INSRT KNEE SZ4 9 (Insert) IMPLANT
BLADE SAW RECIPROCATING 77.5 (BLADE) ×2 IMPLANT
BNDG ELASTIC 4X5.8 VLCR STR LF (GAUZE/BANDAGES/DRESSINGS) ×2 IMPLANT
BNDG ELASTIC 6X5.8 VLCR STR LF (GAUZE/BANDAGES/DRESSINGS) ×2 IMPLANT
BSPLAT TIB 4 KN TRITANIUM (Knees) ×1 IMPLANT
BTRY SRG DRVR LF (MISCELLANEOUS) ×3
CHLORAPREP W/TINT 26 (MISCELLANEOUS) ×4 IMPLANT
COVER SURGICAL LIGHT HANDLE (MISCELLANEOUS) ×2 IMPLANT
COVER WAND RF STERILE (DRAPES) IMPLANT
CUFF TOURN SGL QUICK 34 (TOURNIQUET CUFF) ×2
CUFF TRNQT CYL 34X4.125X (TOURNIQUET CUFF) ×1 IMPLANT
DECANTER SPIKE VIAL GLASS SM (MISCELLANEOUS) ×6 IMPLANT
DERMABOND ADVANCED (GAUZE/BANDAGES/DRESSINGS) ×2
DERMABOND ADVANCED .7 DNX12 (GAUZE/BANDAGES/DRESSINGS) ×2 IMPLANT
DRAPE SHEET LG 3/4 BI-LAMINATE (DRAPES) ×6 IMPLANT
DRAPE U-SHAPE 47X51 STRL (DRAPES) ×2 IMPLANT
DRSG AQUACEL AG ADV 3.5X10 (GAUZE/BANDAGES/DRESSINGS) ×2 IMPLANT
DRSG TEGADERM 4X4.75 (GAUZE/BANDAGES/DRESSINGS) IMPLANT
ELECT BLADE TIP CTD 4 INCH (ELECTRODE) ×2 IMPLANT
ELECT REM PT RETURN 15FT ADLT (MISCELLANEOUS) ×2 IMPLANT
EVACUATOR 1/8 PVC DRAIN (DRAIN) IMPLANT
GAUZE SPONGE 4X4 12PLY STRL (GAUZE/BANDAGES/DRESSINGS) ×2 IMPLANT
GLOVE SRG 8 PF TXTR STRL LF DI (GLOVE) ×2 IMPLANT
GLOVE SURG ENC MOIS LTX SZ8.5 (GLOVE) ×4 IMPLANT
GLOVE SURG ENC TEXT LTX SZ7.5 (GLOVE) ×6 IMPLANT
GLOVE SURG UNDER POLY LF SZ8 (GLOVE) ×4
GLOVE SURG UNDER POLY LF SZ8.5 (GLOVE) ×2 IMPLANT
GOWN SPEC L3 XXLG W/TWL (GOWN DISPOSABLE) ×2 IMPLANT
GOWN SPEC L4 XLG W/TWL (GOWN DISPOSABLE) ×2 IMPLANT
HANDPIECE INTERPULSE COAX TIP (DISPOSABLE) ×2
HOLDER FOLEY CATH W/STRAP (MISCELLANEOUS) ×1 IMPLANT
HOOD PEEL AWAY FLYTE STAYCOOL (MISCELLANEOUS) ×6 IMPLANT
JET LAVAGE IRRISEPT WOUND (IRRIGATION / IRRIGATOR) ×2
KIT TURNOVER KIT A (KITS) ×2 IMPLANT
KNEE FEMORAL COMP RT RETAIN (Knees) ×1 IMPLANT
KNEE PATELLA ASYMMETRIC 10X32 (Knees) ×1 IMPLANT
KNEE TIBIAL COMP TRI SZ4 (Knees) ×1 IMPLANT
LAVAGE JET IRRISEPT WOUND (IRRIGATION / IRRIGATOR) IMPLANT
MARKER SKIN DUAL TIP RULER LAB (MISCELLANEOUS) ×2 IMPLANT
NDL SAFETY ECLIPSE 18X1.5 (NEEDLE) ×1 IMPLANT
NDL SPNL 18GX3.5 QUINCKE PK (NEEDLE) ×1 IMPLANT
NEEDLE HYPO 18GX1.5 SHARP (NEEDLE) ×2
NEEDLE SPNL 18GX3.5 QUINCKE PK (NEEDLE) ×2 IMPLANT
NS IRRIG 1000ML POUR BTL (IV SOLUTION) ×2 IMPLANT
PACK TOTAL KNEE CUSTOM (KITS) ×2 IMPLANT
PADDING CAST COTTON 6X4 STRL (CAST SUPPLIES) ×2 IMPLANT
PENCIL SMOKE EVACUATOR (MISCELLANEOUS) IMPLANT
PIN FLUTED HEDLESS FIX 3.5X1/8 (PIN) ×1 IMPLANT
PROTECTOR NERVE ULNAR (MISCELLANEOUS) ×2 IMPLANT
SAW OSC TIP CART 19.5X105X1.3 (SAW) ×2 IMPLANT
SEALER BIPOLAR AQUA 6.0 (INSTRUMENTS) ×2 IMPLANT
SET HNDPC FAN SPRY TIP SCT (DISPOSABLE) ×1 IMPLANT
SET PAD KNEE POSITIONER (MISCELLANEOUS) ×2 IMPLANT
SPONGE DRAIN TRACH 4X4 STRL 2S (GAUZE/BANDAGES/DRESSINGS) IMPLANT
STAPLER VISISTAT 35W (STAPLE) ×1 IMPLANT
SUT MNCRL AB 3-0 PS2 18 (SUTURE) ×2 IMPLANT
SUT MNCRL AB 4-0 PS2 18 (SUTURE) ×2 IMPLANT
SUT MON AB 2-0 CT1 36 (SUTURE) ×2 IMPLANT
SUT STRATAFIX PDO 1 14 VIOLET (SUTURE) ×2
SUT STRATFX PDO 1 14 VIOLET (SUTURE) ×1
SUT VIC AB 1 CTX 36 (SUTURE) ×4
SUT VIC AB 1 CTX36XBRD ANBCTR (SUTURE) ×2 IMPLANT
SUT VIC AB 2-0 CT1 27 (SUTURE) ×2
SUT VIC AB 2-0 CT1 TAPERPNT 27 (SUTURE) ×1 IMPLANT
SUTURE STRATFX PDO 1 14 VIOLET (SUTURE) ×1 IMPLANT
SYR 3ML LL SCALE MARK (SYRINGE) ×2 IMPLANT
TIBIA BEAR INSERT KNEE SZ4 9 (Insert) ×2 IMPLANT
TOWER CARTRIDGE SMART MIX (DISPOSABLE) IMPLANT
TRAY FOLEY MTR SLVR 16FR STAT (SET/KITS/TRAYS/PACK) IMPLANT
TUBE SUCTION HIGH CAP CLEAR NV (SUCTIONS) ×2 IMPLANT
WATER STERILE IRR 1000ML POUR (IV SOLUTION) ×4 IMPLANT
WRAP KNEE MAXI GEL POST OP (GAUZE/BANDAGES/DRESSINGS) ×1 IMPLANT

## 2020-11-04 NOTE — Evaluation (Signed)
Physical Therapy Evaluation Patient Details Name: [name] MRN: [id number] DOB: [birth year] Today's Date: 11/04/2020   History of Present Illness  Pt is 60 yo female s/p R TKA on 11/04/20.  Pt with PMH including mental retardation, DM, HTN, IBS, MI, sleep apnea, and arthritis of knees  Clinical Impression  Pt is s/p TKA resulting in the deficits listed below (see PT Problem List). Pt lethargic at time of evaluation but was able to take a few steps to and from [facility] with min A.  She reported no pain and had fair quad activiation. ROM was 5 to 85 degrees in R knee.  Pt reports she is from home and walks without assist and occasionally has assist with ADLs.  Pt with hx of MR, she has a personal care aide 3 hr/day.  She reports she will have 24 hr assist at d/c but was vague in providing information as to who that was.  Need to confirm when pt not so lethargic. Pt will benefit from skilled PT to increase their independence and safety with mobility to allow discharge to the venue listed below.      Follow Up Recommendations Follow surgeon's recommendation for DC plan and follow-up therapies;Supervision/Assistance - 24 hour (Need to confirm has 24 hr support)    Equipment Recommendations  Rolling walker with 5" wheels    Recommendations for Other Services       Precautions / Restrictions Precautions Precautions: Fall Restrictions Weight Bearing Restrictions: Yes RLE Weight Bearing: Weight bearing as tolerated      Mobility  Bed Mobility Overal bed mobility: Needs Assistance Bed Mobility: Supine to Sit;Sit to Supine     Supine to sit: Supervision Sit to supine: Supervision   General bed mobility comments: impulsive, sat quickly; cues for safety    Transfers Overall transfer level: Needs assistance Equipment used: Rolling walker (2 wheeled) Transfers: Sit to/from Stand Sit to Stand: Mod assist;Min assist         General transfer comment: Initial stand from bed mod A; stand  from BSC min A; cues for hand placement and R LE management; slow to rise  Ambulation/Gait Ambulation/Gait assistance: Min assist Gait Distance (Feet): 2 Feet (2'x2) Assistive device: Rolling walker (2 wheeled) Gait Pattern/deviations: Step-to pattern;Decreased stride length;Decreased weight shift to right Gait velocity: decreased   General Gait Details: Steps to and from [facility] only; limited by lethargy; cues for RW  Stairs            Wheelchair Mobility    Modified Rankin (Stroke Patients Only)       Balance Overall balance assessment: Needs assistance Sitting-balance support: No upper extremity supported Sitting balance-Leahy Scale: Fair     Standing balance support: Bilateral upper extremity supported Standing balance-Leahy Scale: Poor Standing balance comment: requiring RW and min guard                             Pertinent Vitals/Pain Pain Assessment: No/denies pain    Home Living Family/patient expects to be discharged to:: Private residence Living Arrangements: Alone Available Help at Discharge: Personal care attendant;Friend(s);Family;Available 24 hours/day Type of Home: Apartment Home Access: Elevator     Home Layout: One level Home Equipment: Walker - 4 wheels;Cane - quad;Cane - single point;Toilet riser;Grab bars - tub/shower;Shower seat Additional Comments: Pt lethargic when providing PLOF and with limited responses, but responses do seem consistent with prior admission    Prior Function Level of Independence: Needs assistance  Gait / Transfers Assistance Needed: Reports she walks on her own and sometimes uses a walker or a cane; reports can ambulate in community  ADL's / Homemaking Assistance Needed: Aide assist with IADLs; Pt reports at times aide assist with ADLs but others she can do on her own depends on knee pain.        Hand Dominance   Dominant Hand: Left    Extremity/Trunk Assessment   Upper Extremity Assessment Upper  Extremity Assessment: Overall WFL for tasks assessed    Lower Extremity Assessment Lower Extremity Assessment: LLE deficits/detail;RLE deficits/detail RLE Deficits / Details: expected post op changes; ROM : ankle and hip WFL, knee 5 to 85 degrees; MMT: ankle 5/5, hip 3/5, knee 2/5 RLE Sensation: WNL LLE Deficits / Details: WFL; MMT 5/5 LLE Sensation: WNL    Cervical / Trunk Assessment Cervical / Trunk Assessment: Normal  Communication   Communication: No difficulties  Cognition Arousal/Alertness: Lethargic Behavior During Therapy: WFL for tasks assessed/performed Overall Cognitive Status: No family/caregiver present to determine baseline cognitive functioning                                 General Comments: Hx of cognitive impairments -unsure baseline.  Pt did need increased time to respond at times.  Did follow commands, somewhat impulsive      General Comments      Exercises Total Joint Exercises Ankle Circles/Pumps: AROM;Both;10 reps;Supine Quad Sets: AROM;Both;10 reps;Supine   Assessment/Plan    PT Assessment Patient needs continued PT services  PT Problem List Decreased strength;Decreased mobility;Decreased safety awareness;Decreased range of motion;Decreased knowledge of precautions;Decreased activity tolerance;Decreased cognition;Decreased balance;Decreased knowledge of use of DME       PT Treatment Interventions DME instruction;Therapeutic activities;Gait training;Therapeutic exercise;Patient/family education;Modalities;Stair training;Balance training;Functional mobility training    PT Goals (Current goals can be found in the Care Plan section)  Acute Rehab PT Goals Patient Stated Goal: go to sleep PT Goal Formulation: With patient Time For Goal Achievement: 11/18/20 Potential to Achieve Goals: Good    Frequency 7X/week   Barriers to discharge Decreased caregiver support need to confirm caregiver support    Co-evaluation                AM-PAC PT "6 Clicks" Mobility  Outcome Measure Help needed turning from your back to your side while in a flat bed without using bedrails?: None Help needed moving from lying on your back to sitting on the side of a flat bed without using bedrails?: None Help needed moving to and from a bed to a chair (including a wheelchair)?: A Little Help needed standing up from a chair using your arms (e.g., wheelchair or bedside chair)?: A Little Help needed to walk in hospital room?: A Little Help needed climbing 3-5 steps with a railing? : A Lot 6 Click Score: 19    End of Session Equipment Utilized During Treatment: Gait belt Activity Tolerance: Patient tolerated treatment well Patient left: in bed;with call bell/phone within reach;with bed alarm set;with SCD's reapplied Nurse Communication: Mobility status PT Visit Diagnosis: Other abnormalities of gait and mobility (R26.89);Muscle weakness (generalized) (M62.81)    Time: 1710-1734 PT Time Calculation (min) (ACUTE ONLY): 24 min   Charges:   PT Evaluation $PT Eval Low Complexity: 1 Low PT Treatments $Therapeutic Activity: 8-22 mins        Abran Richard, PT Acute Rehab Services Pager [phone] [facility] [place]  Aeon Koors 11/04/2020, 5:47 PM

## 2020-11-04 NOTE — Anesthesia Procedure Notes (Signed)
Procedure Name: LMA Insertion Date/Time: 11/04/2020 11:34 AM Performed by: Lollie Sails, CRNA Pre-anesthesia Checklist: Patient identified, Emergency Drugs available, Suction available, Patient being monitored and Timeout performed Patient Re-evaluated:Patient Re-evaluated prior to induction Oxygen Delivery Method: Circle system utilized Preoxygenation: Pre-oxygenation with 100% oxygen Induction Type: IV induction Ventilation: Mask ventilation without difficulty LMA: LMA inserted LMA Size: 5.0 Number of attempts: 2 Placement Confirmation: positive ETCO2 and breath sounds checked- equal and bilateral Tube secured with: Tape Dental Injury: Teeth and Oropharynx as per pre-operative assessment

## 2020-11-04 NOTE — Transfer of Care (Signed)
Immediate Anesthesia Transfer of Care Note  Patient: [name]  Procedure(s) Performed: COMPUTER ASSISTED TOTAL KNEE ARTHROPLASTY (Right Knee)  Patient Location: PACU  Anesthesia Type:GA combined with regional for post-op pain  Level of Consciousness: awake, drowsy and responds to stimulation  Airway & Oxygen Therapy: Patient Spontanous Breathing and Patient connected to face mask oxygen  Post-op Assessment: Report given to RN and Post -op Vital signs reviewed and stable  Post vital signs: Reviewed and stable  Last Vitals:  Vitals Value Taken Time  BP 127/71 11/04/20 1354  Temp    Pulse 78 11/04/20 1357  Resp 12 11/04/20 1357  SpO2 99 % 11/04/20 1357  Vitals shown include unvalidated device data.  Last Pain:  Vitals:   11/04/20 1015  TempSrc:   PainSc: 5          Complications: No complications documented.

## 2020-11-04 NOTE — Progress Notes (Signed)
AssistedDr. Jana Half with right, ultrasound guided, adductor canal block. Side rails up, monitors on throughout procedure. See vital signs in flow sheet. Tolerated Procedure well.

## 2020-11-04 NOTE — Anesthesia Procedure Notes (Signed)
Anesthesia Regional Block: Adductor canal block   Pre-Anesthetic Checklist: ,, timeout performed, Correct Patient, Correct Site, Correct Laterality, Correct Procedure, Correct Position, site marked, Risks and benefits discussed,  Surgical consent,  Pre-op evaluation,  At surgeon's request and post-op pain management  Laterality: Right  Prep: Dura Prep       Needles:  Injection technique: Single-shot  Needle Type: Echogenic Stimulator Needle     Needle Length: 10cm  Needle Gauge: 20     Additional Needles:   Procedures:,,,, ultrasound used (permanent image in chart),,,,  Narrative:  Start time: 11/04/2020 10:12 AM End time: 11/04/2020 10:15 AM Injection made incrementally with aspirations every 5 mL.  Performed by: Personally  Anesthesiologist: Darral Dash, DO  Additional Notes: Patient identified. Risks/Benefits/Options discussed with patient including but not limited to bleeding, infection, nerve damage, failed block, incomplete pain control. Patient expressed understanding and wished to proceed. All questions were answered. Sterile technique was used throughout the entire procedure. Please see nursing notes for vital signs. Aspirated in 5cc intervals with injection for negative confirmation. Patient was given instructions on fall risk and not to get out of bed. All questions and concerns addressed with instructions to call with any issues or inadequate analgesia.

## 2020-11-04 NOTE — Discharge Instructions (Signed)
Information on my medicine - ELIQUIS (apixaban)  Why was Eliquis prescribed for you? Eliquis was prescribed for you to reduce the risk of blood clots forming after orthopedic surgery.    What do You need to know about Eliquis? Take your Eliquis TWICE DAILY - one tablet in the morning and one tablet in the evening with or without food.  It would be best to take the dose about the same time each day.  If you have difficulty swallowing the tablet whole please discuss with your pharmacist how to take the medication safely.  Take Eliquis exactly as prescribed by your doctor and DO NOT stop taking Eliquis without talking to the doctor who prescribed the medication.  Stopping without other medication to take the place of Eliquis may increase your risk of developing a clot.  After discharge, you should have regular check-up appointments with your healthcare provider that is prescribing your Eliquis.  What do you do if you miss a dose? If a dose of ELIQUIS is not taken at the scheduled time, take it as soon as possible on the same day and twice-daily administration should be resumed.  The dose should not be doubled to make up for a missed dose.  Do not take more than one tablet of ELIQUIS at the same time.  Important Safety Information A possible side effect of Eliquis is bleeding. You should call your healthcare provider right away if you experience any of the following: ? Bleeding from an injury or your nose that does not stop. ? Unusual colored urine (red or dark brown) or unusual colored stools (red or black). ? Unusual bruising for unknown reasons. ? A serious fall or if you hit your head (even if there is no bleeding).  Some medicines may interact with Eliquis and might increase your risk of bleeding or clotting while on Eliquis. To help avoid this, consult your healthcare provider or pharmacist prior to using any new prescription or non-prescription medications, including herbals,  vitamins, non-steroidal anti-inflammatory drugs (NSAIDs) and supplements.  This website has more information on Eliquis (apixaban): http://www.eliquis.com/eliquis/home  Dr. Rod Can Total Joint Specialist [facility] [address] [place], [place] [address] [phone]  TOTAL KNEE REPLACEMENT POSTOPERATIVE DIRECTIONS    Knee Rehabilitation, Guidelines Following Surgery  Results after knee surgery are often greatly improved when you follow the exercise, range of motion and muscle strengthening exercises prescribed by your doctor. Safety measures are also important to protect the knee from further injury. Any time any of these exercises cause you to have increased pain or swelling in your knee joint, decrease the amount until you are comfortable again and slowly increase them. If you have problems or questions, call your caregiver or physical therapist for advice.   WEIGHT BEARING Weight bearing as tolerated with assist device (walker, cane, etc) as directed, use it as long as suggested by your surgeon or therapist, typically at least 4-6 weeks.  HOME CARE INSTRUCTIONS  . Remove items at home which could result in a fall. This includes throw rugs or furniture in walking pathways.  . Continue medications as instructed at time of discharge. . You may have some home medications which will be placed on hold until you complete the course of blood thinner medication.  . You may start showering once you are discharged home but do not submerge the incision under water. Just pat the incision dry and apply a dry gauze dressing on daily. . Walk with walker as instructed.  Marland Kitchen  You may resume a sexual relationship in one month or when given the OK by your doctor.   Use walker as long as suggested by your caregivers.  Avoid periods of inactivity such as sitting longer than an hour when not asleep. This helps prevent blood clots.  . You may put full weight on your legs  and walk as much as is comfortable.  . You may return to work once you are cleared by your doctor.  . Do not drive a car for 6 weeks or until released by you surgeon.   Do not drive while taking narcotics.  . Wear the elastic stockings for three weeks following surgery during the day but you may remove then at night. . Make sure you keep all of your appointments after your operation with all of your doctors and caregivers. You should call the office at the above phone number and make an appointment for approximately two weeks after the date of your surgery. . Do not remove your surgical dressing. The dressing is waterproof; you may take showers in 3 days, but do not take tub baths or submerge the dressing. . Please pick up a stool softener and laxative for home use as long as you are requiring pain medications.  ICE to the affected knee every three hours for 30 minutes at a time and then as needed for pain and swelling.  Continue to use ice on the knee for pain and swelling from surgery. You may notice swelling that will progress down to the foot and ankle.  This is normal after surgery.  Elevate the leg when you are not up walking on it.   . It is important for you to complete the blood thinner medication as prescribed by your doctor.  Continue to use the breathing machine which will help keep your temperature down.  It is common for your temperature to cycle up and down following surgery, especially at night when you are not up moving around and exerting yourself.  The breathing machine keeps your lungs expanded and your temperature down.  RANGE OF MOTION AND STRENGTHENING EXERCISES  Rehabilitation of the knee is important following a knee injury or an operation. After just a few days of immobilization, the muscles of the thigh which control the knee become weakened and shrink (atrophy). Knee exercises are designed to build up the tone and strength of the thigh muscles and to improve knee motion.  Often times heat used for twenty to thirty minutes before working out will loosen up your tissues and help with improving the range of motion but do not use heat for the first two weeks following surgery. These exercises can be done on a training (exercise) mat, on the floor, on a table or on a bed. Use what ever works the best and is most comfortable for you Knee exercises include:  . Leg Lifts - While your knee is still immobilized in a splint or cast, you can do straight leg raises. Lift the leg to 60 degrees, hold for 3 sec, and slowly lower the leg. Repeat 10-20 times 2-3 times daily. Perform this exercise against resistance later as your knee gets better.  Javier Docker and Hamstring Sets - Tighten up the muscle on the front of the thigh (Quad) and hold for 5-10 sec. Repeat this 10-20 times hourly. Hamstring sets are done by pushing the foot backward against an object and holding for 5-10 sec. Repeat as with quad sets.  A rehabilitation program following  serious knee injuries can speed recovery and prevent re-injury in the future due to weakened muscles. Contact your doctor or a physical therapist for more information on knee rehabilitation.   POST-OPERATIVE OPIOID TAPER INSTRUCTIONS: . It is important to wean off of your opioid medication as soon as possible. If you do not need pain medication after your surgery it is ok to stop day one. Marland Kitchen Opioids include: o Codeine, Hydrocodone(Norco, Vicodin), Oxycodone(Percocet, oxycontin) and hydromorphone amongst others.  . Long term and even short term use of opiods can cause: o Increased pain response o Dependence o Constipation o Depression o Respiratory depression o And more.  . Withdrawal symptoms can include o Flu like symptoms o Nausea, vomiting o And more . Techniques to manage these symptoms o Hydrate well o Eat regular healthy meals o Stay active o Use relaxation techniques(deep breathing, meditating, yoga) . Do Not substitute Alcohol to help  with tapering . If you have been on opioids for less than two weeks and do not have pain than it is ok to stop all together.  . Plan to wean off of opioids o This plan should start within one week post op of your joint replacement. o Maintain the same interval or time between taking each dose and first decrease the dose.  o Cut the total daily intake of opioids by one tablet each day o Next start to increase the time between doses. o The last dose that should be eliminated is the evening dose.      SKILLED REHAB INSTRUCTIONS: If the patient is transferred to a skilled rehab facility following release from the hospital, a list of the current medications will be sent to the facility for the patient to continue.  When discharged from the skilled rehab facility, please have the facility set up the patient's [place] prior to being released. Also, the skilled facility will be responsible for providing the patient with their medications at time of release from the facility to include their pain medication, the muscle relaxants, and their blood thinner medication. If the patient is still at the rehab facility at time of the two week follow up appointment, the skilled rehab facility will also need to assist the patient in arranging follow up appointment in our office and any transportation needs.  MAKE SURE YOU:  . Understand these instructions.  . Will watch your condition.  . Will get help right away if you are not doing well or get worse.    Pick up stool softner and laxative for home use following surgery while on pain medications. Do NOT remove your dressing. You may shower.  Do not take tub baths or submerge incision under water. May shower starting three days after surgery. Please use a clean towel to pat the incision dry following showers. Continue to use ice for pain and swelling after surgery. Do not use any lotions or creams on the incision until instructed by your  surgeon.

## 2020-11-04 NOTE — Op Note (Signed)
OPERATIVE REPORT  SURGEON: Rod Can, MD   ASSISTANT: Cherlynn June, PA-C  PREOPERATIVE DIAGNOSIS: Right knee arthritis.   POSTOPERATIVE DIAGNOSIS: Right knee arthritis.   PROCEDURE: Right total knee arthroplasty.   IMPLANTS: Stryker Triathlon CR femur, size 4. Stryker Tritanium tibia, size 4. X3 polyethelyene insert, size 9 mm, CR. 3 button asymmetric patella, size 32 mm.  ANESTHESIA:  General and Regional  TOURNIQUET TIME: Not utilized.   ESTIMATED BLOOD LOSS:-450 mL    ANTIBIOTICS: 1g Vancomycin. 900 mg clindamycin.  DRAINS: None.  COMPLICATIONS: None   CONDITION: PACU - hemodynamically stable.   BRIEF CLINICAL NOTE: [name] is a 60 y.o. female with a long-standing history of Right knee arthritis. After failing conservative management, the patient was indicated for total knee arthroplasty. The risks, benefits, and alternatives to the procedure were explained, and the patient elected to proceed.  PROCEDURE IN DETAIL: Adductor canal block was obtained in the pre-op holding area. Once inside the operative room, spinal anesthesia was obtained, and a foley catheter was inserted. The patient was then positioned, a nonsterile tourniquet was placed, and the lower extremity was prepped and draped in the normal sterile surgical fashion.  A time-out was called verifying side and site of surgery. The patient received IV antibiotics within 60 minutes of beginning the procedure. The tourniquet was not utilized.   An anterior approach to the knee was performed utilizing a midvastus arthrotomy. A medial release was performed and the patellar fat pad was excised. Stryker navigation was used to cut the distal femur perpendicular to the mechanical axis. A freehand patellar resection was performed, and the patella was sized an prepared with 3 lug holes.  Nagivation was used to make a neutral proximal tibia resection, taking 3  mm of bone from the less affected medial side with 3 degrees of slope. The menisci were excised. A spacer block was placed, and the alignment and balance in extension were confirmed.   The distal femur was sized using the 3-degree external rotation guide referencing the posterior femoral cortex. Due to hypoplasia of the lateral femoral condyle, additional external rotation was added. The appropriate 4-in-1 cutting block was pinned into place. Rotation was checked using Whiteside's line, the epicondylar axis, and then confirmed with a spacer block in flexion. The remaining femoral cuts were performed, taking care to protect the MCL.  The tibia was sized and the trial tray was pinned into place. The remaining trail components were inserted. The knee was stable to varus and valgus stress through a full range of motion. The patella tracked centrally, and the PCL was well balanced. The trial components were removed, and the proximal tibial surface was prepared. Final components were impacted into place. The knee was tested for a final time and found to be well balanced.   The wound was copiously irrigated with Irrisept solution and normal saline using pule lavage.  Marcaine solution was injected into the periarticular soft tissue.  The wound was closed in layers using #1 Vicryl and Stratafix for the fascia, 2-0 Vicryl for the subcutaneous fat, 2-0 Monocryl for the deep dermal layer, 3-0 running Monocryl subcuticular Stitch, and 4-0 Monocryl stay sutures at both ends of the wound. Dermabond was applied to the skin.  Once the glue was fully dried, an Aquacell Ag and compressive dressing were applied.  Tthe patient was transported to the recovery room in stable condition.  Sponge, needle, and instrument counts were correct at the end of the case x2.  The patient  tolerated the procedure well and there were no known complications.  Please note that a surgical assistant was a medical necessity for this procedure in  order to perform it in a safe and expeditious manner. Surgical assistant was necessary to retract the ligaments and vital neurovascular structures to prevent injury to them and also necessary for proper positioning of the limb to allow for anatomic placement of the prosthesis.

## 2020-11-04 NOTE — Care Plan (Signed)
Ortho Bundle Case Management Note  Patient Details  Name: [name] MRN: [id number] Date of Birth: Apr 08, 1961  R TKA on 11-04-20 DCP:  Home with boyfriend.  Lives in apt with elevator. DME:  RW ordered through [place].  Has handicap accessible toilets. PT:  [facility].  PT eval scheduled on 11-08-20 at 10:00 am.                     DME Arranged:  Walker rolling DME Agency:  [employer]  HH Arranged:  NA [place] Agency:  NA  Additional Comments: Please contact me with any questions of if this plan should need to change.  Marianne Sofia, RN,CCM [facility]  [phone] 11/04/2020, 11:03 AM

## 2020-11-04 NOTE — Anesthesia Postprocedure Evaluation (Signed)
Anesthesia Post Note  Patient: [name]  Procedure(s) Performed: COMPUTER ASSISTED TOTAL KNEE ARTHROPLASTY (Right Knee)     Patient location during evaluation: PACU Anesthesia Type: Regional and General Level of consciousness: awake and alert Pain management: pain level controlled Vital Signs Assessment: post-procedure vital signs reviewed and stable Respiratory status: spontaneous breathing, nonlabored ventilation, respiratory function stable and patient connected to nasal cannula oxygen Cardiovascular status: blood pressure returned to baseline and stable Postop Assessment: no apparent nausea or vomiting Anesthetic complications: no   No complications documented.  Last Vitals:  Vitals:   11/04/20 1515 11/04/20 1530  BP: 119/65 116/70  Pulse: 72 73  Resp: 14 14  Temp: 36.6 C 36.4 C  SpO2: 94% 99%    Last Pain:  Vitals:   11/04/20 1600  TempSrc:   PainSc: Asleep                 Belenda Cruise P Merrell Rettinger

## 2020-11-04 NOTE — Interval H&P Note (Signed)
History and Physical Interval Note:  11/04/2020 10:13 AM  [name]  has presented today for surgery, with the diagnosis of right knee degenerative joint disease.  The various methods of treatment have been discussed with the patient and family. After consideration of risks, benefits and other options for treatment, the patient has consented to  Procedure(s): COMPUTER ASSISTED TOTAL KNEE ARTHROPLASTY (Right) as a surgical intervention.  The patient's history has been reviewed, patient examined, no change in status, stable for surgery.  I have reviewed the patient's chart and labs.  Questions were answered to the patient's satisfaction.    The risks, benefits, and alternatives were discussed with the patient. There are risks associated with the surgery including, but not limited to, problems with anesthesia (death), infection, instability (giving out of the joint), dislocation, differences in leg length/angulation/rotation, fracture of bones, loosening or failure of implants, hematoma (blood accumulation) which may require surgical drainage, blood clots, pulmonary embolism, nerve injury (foot drop and lateral thigh numbness), and blood vessel injury. The patient understands these risks and elects to proceed.   [name]

## 2020-11-05 ENCOUNTER — Encounter (HOSPITAL_COMMUNITY): Payer: Self-pay | Admitting: Orthopedic Surgery

## 2020-11-05 DIAGNOSIS — F17210 Nicotine dependence, cigarettes, uncomplicated: Secondary | ICD-10-CM | POA: Diagnosis not present

## 2020-11-05 DIAGNOSIS — Z79899 Other long term (current) drug therapy: Secondary | ICD-10-CM | POA: Diagnosis not present

## 2020-11-05 DIAGNOSIS — M1711 Unilateral primary osteoarthritis, right knee: Secondary | ICD-10-CM | POA: Diagnosis not present

## 2020-11-05 DIAGNOSIS — I10 Essential (primary) hypertension: Secondary | ICD-10-CM | POA: Diagnosis not present

## 2020-11-05 DIAGNOSIS — Z7984 Long term (current) use of oral hypoglycemic drugs: Secondary | ICD-10-CM | POA: Diagnosis not present

## 2020-11-05 DIAGNOSIS — E119 Type 2 diabetes mellitus without complications: Secondary | ICD-10-CM | POA: Diagnosis not present

## 2020-11-05 DIAGNOSIS — Z20822 Contact with and (suspected) exposure to covid-19: Secondary | ICD-10-CM | POA: Diagnosis not present

## 2020-11-05 LAB — GLUCOSE, CAPILLARY
Glucose-Capillary: 93 mg/dL (ref 70–99)
Glucose-Capillary: 112 mg/dL — ABNORMAL HIGH (ref 70–99)
Glucose-Capillary: 109 mg/dL — ABNORMAL HIGH (ref 70–99)
Glucose-Capillary: 121 mg/dL — ABNORMAL HIGH (ref 70–99)

## 2020-11-05 LAB — CBC
HCT: 31.4 % — ABNORMAL LOW (ref 36.0–46.0)
Hemoglobin: 10.6 g/dL — ABNORMAL LOW (ref 12.0–15.0)
MCH: 30.8 pg (ref 26.0–34.0)
MCHC: 33.8 g/dL (ref 30.0–36.0)
MCV: 91.3 fL (ref 80.0–100.0)
Platelets: 150 10*3/uL (ref 150–400)
RBC: 3.44 MIL/uL — ABNORMAL LOW (ref 3.87–5.11)
RDW: 13.8 % (ref 11.5–15.5)
WBC: 9.2 10*3/uL (ref 4.0–10.5)
nRBC: 0 % (ref 0.0–0.2)

## 2020-11-05 LAB — BASIC METABOLIC PANEL
Anion gap: 6 (ref 5–15)
BUN: <5 mg/dL — ABNORMAL LOW (ref 6–20)
CO2: 28 mmol/L (ref 22–32)
Calcium: 8.2 mg/dL — ABNORMAL LOW (ref 8.9–10.3)
Chloride: 105 mmol/L (ref 98–111)
Creatinine, Ser: 0.57 mg/dL (ref 0.44–1.00)
GFR, Estimated: >60 mL/min (ref >60)
Glucose, Bld: 108 mg/dL — ABNORMAL HIGH (ref 70–99)
Potassium: 4 mmol/L (ref 3.5–5.1)
Sodium: 139 mmol/L (ref 135–145)

## 2020-11-05 MED ORDER — ONDANSETRON HCL 4 MG PO TABS: 4.0000 mg | ORAL_TABLET | Freq: Four times a day (QID) | ORAL | 0 refills | Status: DC | PRN

## 2020-11-05 MED ORDER — DOCUSATE SODIUM 100 MG PO CAPS: 100.0000 mg | ORAL_CAPSULE | Freq: Two times a day (BID) | ORAL | 0 refills | Status: DC

## 2020-11-05 MED ORDER — SENNA 8.6 MG PO TABS: 1.0000 | ORAL_TABLET | Freq: Two times a day (BID) | ORAL | 0 refills | Status: DC

## 2020-11-05 MED ORDER — OXYCODONE HCL 5 MG PO TABS: 5.0000 mg | ORAL_TABLET | ORAL | 0 refills | Status: DC | PRN

## 2020-11-05 MED ORDER — APIXABAN 2.5 MG PO TABS: 2.5000 mg | ORAL_TABLET | Freq: Two times a day (BID) | ORAL | 0 refills | Status: DC

## 2020-11-05 NOTE — Progress Notes (Signed)
Physical Therapy Treatment Patient Details Name: [name] MRN: [id number] DOB: March 19, 1961 Today's Date: 11/05/2020    History of Present Illness Pt is 60 yo female s/p R TKA on 11/04/20.  Pt with PMH including mental retardation, DM, HTN, IBS, MI, sleep apnea, and arthritis of knees    PT Comments    Pt with limited treatment due to decreased level of arousal and myoclonic type jerking when up.  HR and O2 sats were stable.  Checked for orthostatic BP but question accuracy of some reading due to jerking and arm tense.  BP in sitting was 109/93, standing 128/86, return to supine 140/71. Notified RN.  Also, agree with RN that knee immobilizer would be beneficial to maintain knee straight when resting and prevent buckling in standing as pt with decreased ability to WB.  Also, pt remains vague about who will assist at home - encouraged her to have them visit so they could receive training from therapy.    Follow Up Recommendations  Follow surgeon's recommendation for DC plan and follow-up therapies;Supervision/Assistance - 24 hour (need to confirm 24 hr support)     Equipment Recommendations  Rolling walker with 5" wheels    Recommendations for Other Services       Precautions / Restrictions Precautions Precautions: Fall Restrictions RLE Weight Bearing: Weight bearing as tolerated    Mobility  Bed Mobility Overal bed mobility: Needs Assistance Bed Mobility: Sit to Supine       Sit to supine: Mod assist   General bed mobility comments: Mod A for legs back to bed; she was sitting EOB at arrival    Transfers Overall transfer level: Needs assistance Equipment used: Rolling walker (2 wheeled) Transfers: Sit to/from Stand Sit to Stand: +2 safety/equipment;Mod assist         General transfer comment: Mod A of 2 for safety to rise from bed with increased time to rise.  Cues for hand placement and use of L LE to push up.  (see general  comments)  Ambulation/Gait Ambulation/Gait assistance: Mod assist;+2 safety/equipment Gait Distance (Feet): 1 Feet Assistive device: Rolling walker (2 wheeled) Gait Pattern/deviations: Step-to pattern;Decreased weight shift to right;Shuffle Gait velocity: decreased   General Gait Details: Pt only taking 1-2 steps/pivots toward HOB on L LE with no weight on R LE.  LImited due to lethergy, unable to bear weighton R LE due to pain, and decreased level of arousal/ability to follow commands (see general comments)   Stairs             Wheelchair Mobility    Modified Rankin (Stroke Patients Only)       Balance Overall balance assessment: Needs assistance Sitting-balance support: No upper extremity supported Sitting balance-Leahy Scale: Fair Sitting balance - Comments: Needs supervision   Standing balance support: Bilateral upper extremity supported Standing balance-Leahy Scale: Poor Standing balance comment: requiring RW and mod A with Lean L to offload R side; also with myoclonic type jerking                            Cognition Arousal/Alertness: Lethargic Behavior During Therapy: WFL for tasks assessed/performed Overall Cognitive Status: No family/caregiver present to determine baseline cognitive functioning                                 General Comments: Hx of cognitive impairments -unsure baseline.  Pt did need increased  time to respond at times and continues to provide vague answers to assist level at home.  Pt also having difficulty expressing how she feels in regards to lightheadedness.  Did follow commands, somewhat impulsive.      Exercises Total Joint Exercises Ankle Circles/Pumps: AROM;Both;10 reps;Seated Quad Sets: AROM;Both;10 reps;Supine Long Arc Quad: AAROM;Right;10 reps;Seated Knee Flexion: AAROM;Right;10 reps;Seated Goniometric ROM: R knee 10 to 50 degrees    General Comments General comments (skin integrity, edema, etc.):  At arrival pt sitting edge of bed.  Noted tremors and myoclonic type jerking increased as she was sitting there performing exercises. She was on RA with O2 sats >96% in session and HR stable.  CHecked and BP was 109/93 in sitting.  She reports some dizziness but was unable to really describe.  Eventually reports felt "blurry." When had pt stand to move toward [facility], took BP and was 129/86 although question accuracy as pt jerking and tense holding walker.  Got her to take 1-2 steps and then sat back down, but required increased time and cues due to not following commands/decreased level of arousal.  REturned pt to supine and BP 140/71 and pt falling asleep.  Notified RN of pt's jerking motions and ?orthostatic hypotension.  RN also asked about knee immoilizer - this would be beneficial due to decreased ability to WB on R knee and pt tending to sleep on side with knees flexed.      Pertinent Vitals/Pain Pain Assessment: Faces Faces Pain Scale: Hurts little more Pain Location: R knee Pain Descriptors / Indicators: Discomfort;Grimacing;Guarding Pain Intervention(s): Limited activity within patient's tolerance;Monitored during session;Repositioned;Relaxation    Home Living                      Prior Function            PT Goals (current goals can now be found in the care plan section) Acute Rehab PT Goals Patient Stated Goal: go to sleep PT Goal Formulation: With patient Time For Goal Achievement: 11/18/20 Potential to Achieve Goals: Good Progress towards PT goals: Not progressing toward goals - comment (lethargy)    Frequency    7X/week      PT Plan Current plan remains appropriate    Co-evaluation              AM-PAC PT "6 Clicks" Mobility   Outcome Measure  Help needed turning from your back to your side while in a flat bed without using bedrails?: A Little Help needed moving from lying on your back to sitting on the side of a flat bed without using bedrails?: A  Little Help needed moving to and from a bed to a chair (including a wheelchair)?: A Lot Help needed standing up from a chair using your arms (e.g., wheelchair or bedside chair)?: A Lot Help needed to walk in hospital room?: A Lot Help needed climbing 3-5 steps with a railing? : Total 6 Click Score: 13    End of Session Equipment Utilized During Treatment: Gait belt Activity Tolerance: Patient limited by lethargy (and questionable ortostatic hypotension) Patient left: in bed;with call bell/phone within reach;with bed alarm set;with SCD's reapplied Nurse Communication: Mobility status PT Visit Diagnosis: Other abnormalities of gait and mobility (R26.89);Muscle weakness (generalized) (M62.81)     Time: 1000-1021 PT Time Calculation (min) (ACUTE ONLY): 21 min  Charges:  $Therapeutic Activity: 8-22 mins  Abran Richard, PT Acute Rehab Services Pager [phone] [facility] [place] 11/05/2020, 11:29 AM

## 2020-11-05 NOTE — Progress Notes (Signed)
Physical Therapy Treatment Patient Details Name: [name] MRN: [id number] DOB: 07/09/1961 Today's Date: 11/05/2020    History of Present Illness Pt is 60 yo female s/p R TKA on 11/04/20.  Pt with PMH including mental retardation, DM, HTN, IBS, MI, sleep apnea, and arthritis of knees    PT Comments    Pt with very limited session due to lethargy, decreased ability to follow commands, and myoclonic like jerking making even sitting at EOB and supine exercises difficult. She was not safe to progress OOB.  Tested orthostatic BP with manual cuff and they were negative and symptoms were the same in all positions.  Notified RN of pt's condition but that VSS.     Follow Up Recommendations  Follow surgeon's recommendation for DC plan and follow-up therapies;Supervision/Assistance - 24 hour     Equipment Recommendations  Rolling walker with 5" wheels    Recommendations for Other Services       Precautions / Restrictions Precautions Precautions: Fall Restrictions RLE Weight Bearing: Weight bearing as tolerated    Mobility  Bed Mobility Overal bed mobility: Needs Assistance Bed Mobility: Rolling;Supine to Sit;Sit to Supine Rolling: Min guard (min guard to protect R leg and for safety)   Supine to sit: Mod assist;+2 for safety/equipment Sit to supine: Mod assist;+2 for safety/equipment   General bed mobility comments: Mod A for transfers for safety and to stabilize.  See general comments    Transfers Unsafe to attempt standing or gait.     Stairs             Wheelchair Mobility    Modified Rankin (Stroke Patients Only)       Balance Overall balance assessment: Needs assistance Sitting-balance support: No upper extremity supported Sitting balance-Leahy Scale: Poor Sitting balance - Comments: Pt with myoclonic type jerking, leaning to right, needing mod A   Standing balance support: Bilateral upper extremity supported Standing balance-Leahy Scale:  Poor Standing balance comment: unable                            Cognition Arousal/Alertness: Lethargic Behavior During Therapy: WFL for tasks assessed/performed Overall Cognitive Status: No family/caregiver present to determine baseline cognitive functioning                                 General Comments: Hx of cognitive impairments -unsure baseline.  Pt did need increased time to respond at times and continues to provide vague answers to assist level at home.  Pt also having difficulty expressing how she feels in regards to lightheadedness.  Follows limited commands.      Exercises Total Joint Exercises Ankle Circles/Pumps: AAROM;10 reps;Supine;Both Quad Sets: AROM;Both;10 reps;Supine Heel Slides: AAROM;Right;10 reps;Supine Hip ABduction/ADduction: AAROM;Right;10 reps;Supine Long Arc Quad: AAROM;Right;10 reps;Seated Knee Flexion: AAROM;Right;10 reps;Seated Goniometric ROM: R knee 10 to 50 degrees    General Comments General comments (skin integrity, edema, etc.):  Pt very lethargic at arrival.  Noted pt with myoclonic like jerking in supine.  Obtained manual BP cuff.  BP was 110/72 in supine, 93/60 initial sit, 120/75 sit 3 mins.  However, symptoms of myoclonic jerks, decreased arousal, and decreased following commands same in all positions and worse than am.  In sitting, pt was leaning to L to lay back down.  Pt was not safely able to eggress OOB due to above. Even with exercises in supine, pt following limited  commands and jerking R leg during heel slides.  Notified RN.       Pertinent Vitals/Pain Pain Assessment: Faces Faces Pain Scale: Hurts little more Pain Location: R knee Pain Descriptors / Indicators: Discomfort;Grimacing;Guarding Pain Intervention(s): Limited activity within patient's tolerance;Monitored during session;Repositioned    Home Living                      Prior Function            PT Goals (current goals can now be  found in the care plan section) Acute Rehab PT Goals Patient Stated Goal: go to sleep PT Goal Formulation: With patient Time For Goal Achievement: 11/18/20 Potential to Achieve Goals: Good Progress towards PT goals: Not progressing toward goals - comment (lethargy)    Frequency    7X/week      PT Plan Current plan remains appropriate    Co-evaluation              AM-PAC PT "6 Clicks" Mobility   Outcome Measure  Help needed turning from your back to your side while in a flat bed without using bedrails?: A Little Help needed moving from lying on your back to sitting on the side of a flat bed without using bedrails?: A Lot Help needed moving to and from a bed to a chair (including a wheelchair)?: Total Help needed standing up from a chair using your arms (e.g., wheelchair or bedside chair)?: Total Help needed to walk in hospital room?: Total Help needed climbing 3-5 steps with a railing? : Total 6 Click Score: 9    End of Session Equipment Utilized During Treatment: Right knee immobilizer Activity Tolerance: Patient limited by lethargy Patient left: in bed;with call bell/phone within reach;with bed alarm set;with SCD's reapplied;Other (comment) (knee immobilizer in place) Nurse Communication: Mobility status PT Visit Diagnosis: Other abnormalities of gait and mobility (R26.89);Muscle weakness (generalized) (M62.81)     Time: 1400-1431 PT Time Calculation (min) (ACUTE ONLY): 31 min  Charges:  $Therapeutic Exercise: 8-22 mins $Therapeutic Activity: 8-22 mins                     Abran Richard, PT Acute Rehab Services Pager [phone] [facility] [place] 11/05/2020, 3:17 PM

## 2020-11-05 NOTE — Progress Notes (Addendum)
    Subjective:  Patient reports pain as mild to moderate.  Denies N/V/CP/SOB.   Objective:   VITALS:   Vitals:   11/04/20 1803 11/04/20 2214 11/05/20 0115 11/05/20 0548  BP: (!) 109/53 (!) 107/57 120/60 107/63  Pulse: 73 72 69 77  Resp: 18 16 16    Temp: 97.8 F (36.6 C) 98 F (36.7 C) 98.3 F (36.8 C) 98 F (36.7 C)  TempSrc: Oral Oral Oral Oral  SpO2: 91% 97% 96% 92%  Weight:      Height:        NAD ABD soft Neurovascular intact Sensation intact distally Intact pulses distally Dorsiflexion/Plantar flexion intact Incision: dressing C/D/I   Lab Results  Component Value Date   WBC 9.2 11/05/2020   HGB 10.6 (L) 11/05/2020   HCT 31.4 (L) 11/05/2020   MCV 91.3 11/05/2020   PLT 150 11/05/2020   BMET    Component Value Date/Time   NA 139 11/05/2020 0340   NA 137 07/16/2020 1111   K 4.0 11/05/2020 0340   CL 105 11/05/2020 0340   CO2 28 11/05/2020 0340   GLUCOSE 108 (H) 11/05/2020 0340   BUN <5 (L) 11/05/2020 0340   BUN 4 (L) 07/16/2020 1111   CREATININE 0.57 11/05/2020 0340   CREATININE 0.84 05/01/2013 1044   CALCIUM 8.2 (L) 11/05/2020 0340   GFRNONAA >60 11/05/2020 0340   GFRAA 92 07/16/2020 1111     Assessment/Plan: 1 Day Post-Op   Principal Problem:   Degenerative arthritis of right knee Active Problems:   Osteoarthritis of right knee   WBAT with walker DVT ppx: Apixaban, SCDs, TEDS PO pain control PT/OT Dispo: D/C home once cleared by therapy   Dorothyann Peng 11/05/2020, 9:28 AM [facility] is now [facility] [address] [place], [place], [place] [address] Phone: [phone] [email or web address] Facebook  [employer]

## 2020-11-05 NOTE — Discharge Summary (Signed)
Physician Discharge Summary  Patient ID: [name] MRN: [id number] DOB/AGE: 03-10-1961 60 y.o.  Admit date: 11/04/2020 Discharge date: 11/10/2020  Admission Diagnoses:  Degenerative arthritis of right knee  Discharge Diagnoses:  Principal Problem:   Degenerative arthritis of right knee Active Problems:   Osteoarthritis of right knee   Past Medical History:  Diagnosis Date  . Anxiety   . Aortic atherosclerosis ([place]) 09/19/2020   Seen on CAT scan 2021  . Arthritis   . Depression   . Diabetes mellitus   . Diabetes mellitus, type II ([place])   . Dyspnea   . Dysrhythmia   . GERD (gastroesophageal reflux disease)   . Heart murmur   . HTN (hypertension)   . Hyperglycemia   . IBS (irritable bowel syndrome)   . Lung nodules    right, followed by PCP, PET 11/2011  . Mental retardation   . MI (myocardial infarction) ([place])   . Migraines   . Pneumonia   . Sleep apnea    cpap   . Stroke [facility])     Surgeries: Procedure(s): COMPUTER ASSISTED TOTAL KNEE ARTHROPLASTY on 11/04/2020   Consultants (if any):   Discharged Condition: Improved  Hospital Course: [name] is an 60 y.o. female who was admitted 11/04/2020 with a diagnosis of Degenerative arthritis of right knee and went to the operating room on 11/04/2020 and underwent the above named procedures.    She was given perioperative antibiotics:  Anti-infectives (From admission, onward)   Start     Dose/Rate Route Frequency Ordered Stop   11/04/20 2200  vancomycin (VANCOREADY) IVPB 1000 mg/200 mL        1,000 mg 200 mL/hr over 60 Minutes Intravenous Every 12 hours 11/04/20 1532 11/04/20 2354   11/04/20 0830  clindamycin (CLEOCIN) IVPB 900 mg        900 mg 100 mL/hr over 30 Minutes Intravenous On call to O.R. 11/04/20 0827 11/04/20 1202   11/04/20 0830  vancomycin (VANCOCIN) IVPB 1000 mg/200 mL premix        1,000 mg 200 mL/hr over 60 Minutes Intravenous On call to O.R. 11/04/20 0827 11/04/20 1125    .  She was given  sequential compression devices, early ambulation, and apixaban for DVT prophylaxis.  She benefited maximally from the hospital stay and there were no complications.  She had slow progression with PT, and therefore SNF was recommended.  Recent vital signs:  Vitals:   11/09/20 2116 11/10/20 0619  BP: (!) 112/58 124/86  Pulse: 72 65  Resp: 18 18  Temp: 97.9 F (36.6 C) 98.3 F (36.8 C)  SpO2: 93% 96%    Recent laboratory studies:  Lab Results  Component Value Date   HGB 8.9 (L) 11/07/2020   HGB 9.6 (L) 11/06/2020   HGB 10.6 (L) 11/05/2020   Lab Results  Component Value Date   WBC 7.9 11/07/2020   PLT 152 11/07/2020   Lab Results  Component Value Date   INR 1.0 10/25/2020   Lab Results  Component Value Date   NA 139 11/05/2020   K 4.0 11/05/2020   CL 105 11/05/2020   CO2 28 11/05/2020   BUN <5 (L) 11/05/2020   CREATININE 0.57 11/05/2020   GLUCOSE 108 (H) 11/05/2020     WEIGHT BEARING   Weight bearing as tolerated with assist device (walker, cane, etc) as directed, use it as long as suggested by your surgeon or therapist, typically at least 4-6 weeks.   EXERCISES  Results after joint  replacement surgery are often greatly improved when you follow the exercise, range of motion and muscle strengthening exercises prescribed by your doctor. Safety measures are also important to protect the joint from further injury. Any time any of these exercises cause you to have increased pain or swelling, decrease what you are doing until you are comfortable again and then slowly increase them. If you have problems or questions, call your caregiver or physical therapist for advice.   Rehabilitation is important following a joint replacement. After just a few days of immobilization, the muscles of the leg can become weakened and shrink (atrophy).  These exercises are designed to build up the tone and strength of the thigh and leg muscles and to improve motion. Often times heat used for  twenty to thirty minutes before working out will loosen up your tissues and help with improving the range of motion but do not use heat for the first two weeks following surgery (sometimes heat can increase post-operative swelling).   These exercises can be done on a training (exercise) mat, on the floor, on a table or on a bed. Use whatever works the best and is most comfortable for you.    Use music or television while you are exercising so that the exercises are a pleasant break in your day. This will make your life better with the exercises acting as a break in your routine that you can look forward to.   Perform all exercises about fifteen times, three times per day or as directed.  You should exercise both the operative leg and the other leg as well.  Exercises include:   . Quad Sets - Tighten up the muscle on the front of the thigh (Quad) and hold for 5-10 seconds.   . Straight Leg Raises - With your knee straight (if you were given a brace, keep it on), lift the leg to 60 degrees, hold for 3 seconds, and slowly lower the leg.  Perform this exercise against resistance later as your leg gets stronger.  . Leg Slides: Lying on your back, slowly slide your foot toward your buttocks, bending your knee up off the floor (only go as far as is comfortable). Then slowly slide your foot back down until your leg is flat on the floor again.  [place] Haring Wings: Lying on your back spread your legs to the side as far apart as you can without causing discomfort.  . Hamstring Strength:  Lying on your back, push your heel against the floor with your leg straight by tightening up the muscles of your buttocks.  Repeat, but this time bend your knee to a comfortable angle, and push your heel against the floor.  You may put a pillow under the heel to make it more comfortable if necessary.   A rehabilitation program following joint replacement surgery can speed recovery and prevent re-injury in the future due to weakened  muscles. Contact your doctor or a physical therapist for more information on knee rehabilitation.    CONSTIPATION  Constipation is defined medically as fewer than three stools per week and severe constipation as less than one stool per week.  Even if you have a regular bowel pattern at home, your normal regimen is likely to be disrupted due to multiple reasons following surgery.  Combination of anesthesia, postoperative narcotics, change in appetite and fluid intake all can affect your bowels.   YOU MUST use at least one of the following options; they are listed in order of  increasing strength to get the job done.  They are all available over the counter, and you may need to use some, POSSIBLY even all of these options:    Drink plenty of fluids (prune juice may be helpful) and high fiber foods Colace 100 mg by mouth twice a day  Senokot for constipation as directed and as needed Dulcolax (bisacodyl), take with full glass of water  Miralax (polyethylene glycol) once or twice a day as needed.  If you have tried all these things and are unable to have a bowel movement in the first 3-4 days after surgery call either your surgeon or your primary doctor.    If you experience loose stools or diarrhea, hold the medications until you stool forms back up.  If your symptoms do not get better within 1 week or if they get worse, check with your doctor.  If you experience "the worst abdominal pain ever" or develop nausea or vomiting, please contact the office immediately for further recommendations for treatment.   ITCHING:  If you experience itching with your medications, try taking only a single pain pill, or even half a pain pill at a time.  You can also use Benadryl over the counter for itching or also to help with sleep.   TED HOSE STOCKINGS:  Use stockings on both legs until for at least 2 weeks or as directed by physician office. They may be removed at night for sleeping.  MEDICATIONS:  See your  medication summary on the "After Visit Summary" that nursing will review with you.  You may have some home medications which will be placed on hold until you complete the course of blood thinner medication.  It is important for you to complete the blood thinner medication as prescribed.  PRECAUTIONS:  If you experience chest pain or shortness of breath - call 911 immediately for transfer to the hospital emergency department.   If you develop a fever greater that 101 F, purulent drainage from wound, increased redness or drainage from wound, foul odor from the wound/dressing, or calf pain - CONTACT YOUR SURGEON.                                                   FOLLOW-UP APPOINTMENTS:  If you do not already have a post-op appointment, please call the office for an appointment to be seen by your surgeon.  Guidelines for how soon to be seen are listed in your "After Visit Summary", but are typically between 1-4 weeks after surgery.  OTHER INSTRUCTIONS:   Knee Replacement:  Do not place pillow under knee, focus on keeping the knee straight while resting. CPM instructions: 0-90 degrees, 2 hours in the morning, 2 hours in the afternoon, and 2 hours in the evening. Place foam block, curve side up under heel at all times except when in CPM or when walking.  DO NOT modify, tear, cut, or change the foam block in any way.   MAKE SURE YOU:  . Understand these instructions.  . Get help right away if you are not doing well or get worse.    Thank you for letting us be a part of your medical care team.  It is a privilege we respect greatly.  We hope these instructions will help you stay on track for a fast and full recovery!  Diagnostic Studies: DG Knee Right Port  Result Date: 11/04/2020 CLINICAL DATA:  Status post right knee replacement. EXAM: PORTABLE RIGHT KNEE - 1-2 VIEW COMPARISON:  None. FINDINGS: Right knee arthroplasty in expected alignment. No periprosthetic lucency or fracture. Patellar  arthroplasty. Recent postsurgical change includes air and edema in the soft tissues and joint space. Anterior skin staples. IMPRESSION: Right knee arthroplasty without immediate postoperative complication. Electronically Signed   By: Keith Rake M.D.   On: 11/04/2020 15:16   VAS [place] ABI WITH/WO TBI  Result Date: 10/27/2020 LOWER EXTREMITY DOPPLER STUDY Indications: Pre-operative exam. High Risk         Hypertension, hyperlipidemia, Diabetes, past history of Factors:          smoking. Other Factors: Constant leg pain.  Comparison Study: No prior exam Performing Technologist: Alvia Grove RVT  Examination Guidelines: A complete evaluation includes at minimum, Doppler waveform signals and systolic blood pressure reading at the level of bilateral brachial, anterior tibial, and posterior tibial arteries, when vessel segments are accessible. Bilateral testing is considered an integral part of a complete examination. Photoelectric Plethysmograph (PPG) waveforms and toe systolic pressure readings are included as required and additional duplex testing as needed. Limited examinations for reoccurring indications may be performed as noted.  ABI Findings: +---------+------------------+-----+---------+--------+ Right    Rt Pressure (mmHg)IndexWaveform Comment  +---------+------------------+-----+---------+--------+ Brachial 113                                      +---------+------------------+-----+---------+--------+ PTA      151               1.17 triphasic         +---------+------------------+-----+---------+--------+ DP       151               1.17 triphasic         +---------+------------------+-----+---------+--------+ Great Toe121               0.94 Normal            +---------+------------------+-----+---------+--------+ +---------+------------------+-----+---------+-------+ Left     Lt Pressure (mmHg)IndexWaveform Comment +---------+------------------+-----+---------+-------+  Brachial 129                                     +---------+------------------+-----+---------+-------+ PTA      150               1.16 triphasic        +---------+------------------+-----+---------+-------+ DP       124               0.96 triphasic        +---------+------------------+-----+---------+-------+ Great Toe116               0.90 Normal           +---------+------------------+-----+---------+-------+   Summary: Right: Resting right ankle-brachial index is within normal range. No evidence of significant right lower extremity arterial disease. The right toe-brachial index is normal. Left: Resting left ankle-brachial index is within normal range. No evidence of significant left lower extremity arterial disease. The left toe-brachial index is normal.  *See table(s) above for measurements and observations.  Electronically signed by Deitra Mayo MD on 10/27/2020 at 12:19:28 PM.    Final     Disposition: Discharge disposition: 03-Skilled [place]  Discharge Instructions    Call MD / Call 911   Complete by: As directed    If you experience chest pain or shortness of breath, CALL 911 and be transported to the hospital emergency room.  If you develope a fever above 101 F, pus (white drainage) or increased drainage or redness at the wound, or calf pain, call your surgeon's office.   Call MD / Call 911   Complete by: As directed    If you experience chest pain or shortness of breath, CALL 911 and be transported to the hospital emergency room.  If you develope a fever above 101 F, pus (white drainage) or increased drainage or redness at the wound, or calf pain, call your surgeon's office.   Constipation Prevention   Complete by: As directed    Drink plenty of fluids.  Prune juice may be helpful.  You may use a stool softener, such as Colace (over the counter) 100 mg twice a day.  Use MiraLax (over the counter) for constipation as needed.   Constipation  Prevention   Complete by: As directed    Drink plenty of fluids.  Prune juice may be helpful.  You may use a stool softener, such as Colace (over the counter) 100 mg twice a day.  Use MiraLax (over the counter) for constipation as needed.   Diet - low sodium heart healthy   Complete by: As directed    Do not put a pillow under the knee. Place it under the heel.   Complete by: As directed    Do not put a pillow under the knee. Place it under the heel.   Complete by: As directed    Driving restrictions   Complete by: As directed    No driving for 6 weeks   Driving restrictions   Complete by: As directed    No driving for 6 weeks   Increase activity slowly as tolerated   Complete by: As directed    Increase activity slowly as tolerated   Complete by: As directed    Lifting restrictions   Complete by: As directed    No lifting for 6 weeks   Lifting restrictions   Complete by: As directed    No lifting for 6 weeks   Post-operative opioid taper instructions:   Complete by: As directed    POST-OPERATIVE OPIOID TAPER INSTRUCTIONS: It is important to wean off of your opioid medication as soon as possible. If you do not need pain medication after your surgery it is ok to stop day one. Opioids include: Codeine, Hydrocodone(Norco, Vicodin), Oxycodone(Percocet, oxycontin) and hydromorphone amongst others.  Long term and even short term use of opiods can cause: Increased pain response Dependence Constipation Depression Respiratory depression And more.  Withdrawal symptoms can include Flu like symptoms Nausea, vomiting And more Techniques to manage these symptoms Hydrate well Eat regular healthy meals Stay active Use relaxation techniques(deep breathing, meditating, yoga) Do Not substitute Alcohol to help with tapering If you have been on opioids for less than two weeks and do not have pain than it is ok to stop all together.  Plan to wean off of opioids This plan should start  within one week post op of your joint replacement. Maintain the same interval or time between taking each dose and first decrease the dose.  Cut the total daily intake of opioids by one tablet each day Next start to increase the time between doses. The last dose that should be eliminated is  the evening dose.      Post-operative opioid taper instructions:   Complete by: As directed    POST-OPERATIVE OPIOID TAPER INSTRUCTIONS: It is important to wean off of your opioid medication as soon as possible. If you do not need pain medication after your surgery it is ok to stop day one. Opioids include: Codeine, Hydrocodone(Norco, Vicodin), Oxycodone(Percocet, oxycontin) and hydromorphone amongst others.  Long term and even short term use of opiods can cause: Increased pain response Dependence Constipation Depression Respiratory depression And more.  Withdrawal symptoms can include Flu like symptoms Nausea, vomiting And more Techniques to manage these symptoms Hydrate well Eat regular healthy meals Stay active Use relaxation techniques(deep breathing, meditating, yoga) Do Not substitute Alcohol to help with tapering If you have been on opioids for less than two weeks and do not have pain than it is ok to stop all together.  Plan to wean off of opioids This plan should start within one week post op of your joint replacement. Maintain the same interval or time between taking each dose and first decrease the dose.  Cut the total daily intake of opioids by one tablet each day Next start to increase the time between doses. The last dose that should be eliminated is the evening dose.      TED hose   Complete by: As directed    Use stockings (TED hose) for 2 weeks on both leg(s).  You may remove them at night for sleeping.   TED hose   Complete by: As directed    Use stockings (TED hose) for 2 weeks on both leg(s).  You may remove them at night for sleeping.    Dental Antibiotics:  In  most cases prophylactic antibiotics for Dental procdeures after total joint surgery are not necessary.  Exceptions are as follows:  1. History of prior total joint infection  2. Severely immunocompromised (Organ Transplant, cancer chemotherapy, Rheumatoid biologic meds such as [place])  3. Poorly controlled diabetes (A1C &gt; 8.0, blood glucose over 200)  If you have one of these conditions, contact your surgeon for an antibiotic prescription, prior to your dental procedure.   Contact information for follow-up providers    Rod Can, MD. Go on 11/22/2020.   Specialty: Orthopedic Surgery Why: You are scheduled for a post-operative appointment on 11-22-20 at 10:30 am.  Contact information: [address] [place] [address] [phone]            Contact information for after-discharge care    [facility] .   Service: Skilled Nursing Contact information: [address] [place] [phone]                   Signed: Bertram Savin 11/10/2020, 11:06 AM

## 2020-11-05 NOTE — Progress Notes (Signed)
Orthopedic Tech Progress Note Patient Details:  [name] [birth year] [id number]  Ortho Devices Type of Ortho Device: Knee Immobilizer Ortho Device/Splint Location: Right Leg Ortho Device/Splint Interventions: Application   Post Interventions Patient Tolerated: Well   Linus Salmons Zaden Sako 11/05/2020, 10:42 AM

## 2020-11-05 NOTE — TOC Initial Note (Signed)
Transition of Care [facility]) - Initial/Assessment Note   Patient Details  Name: [name] MRN: [id number] Date of Birth: 1961/06/27  [facility] CM/SW Contact:    Sherie Don, LCSW Phone Number: 11/05/2020, 12:55 PM  Clinical Narrative: Patient is a 60 year old female who is in an outpatient bed for degenerative arthritis of right knee surgery. CSW met with patient to review discharge plan. Per patient, she has a PCS aide through her Medicaid that normally comes to her home 2 hours/day Monday-Friday that will come out 4 hours/day now that she has had surgery. Patient confirmed she received a rollator in the past 5 years, so she is not eligible for a rolling walker being covered by insurance. Patient is expected to discharge home with OPPT through [place].  Expected Discharge Plan: OP Rehab Barriers to Discharge: Continued Medical Work up  Patient Goals and CMS Choice Patient states their goals for this hospitalization and ongoing recovery are:: Discharge home with OPPT and PCS CMS Medicare.gov Compare Post Acute Care list provided to:: Patient Choice offered to / list presented to : NA  Expected Discharge Plan and Services Expected Discharge Plan: OP Rehab In-house Referral: Clinical Social Work Post Acute Care Choice: NA Living arrangements for the past 2 months: Single Family Home Expected Discharge Date: 11/05/20               DME Arranged: N/A DME Agency: NA HH Arranged: NA [place] Agency: NA  Prior Living Arrangements/Services Living arrangements for the past 2 months: Single Family Home Patient language and need for interpreter reviewed:: Yes Do you feel safe going back to the place where you live?: Yes      Need for Family Participation in Patient Care: No (Comment) Care giver support system in place?: Yes (comment) Current home services: DME (Rollator) Criminal Activity/Legal Involvement Pertinent to Current Situation/Hospitalization: No - Comment  as needed  Activities of Daily Living Home Assistive Devices/Equipment: Walker (specify type),Wheelchair,Cane (specify quad or straight) ADL Screening (condition at time of admission) Patient's cognitive ability adequate to safely complete daily activities?: Yes Is the patient deaf or have difficulty hearing?: No Does the patient have difficulty seeing, even when wearing glasses/contacts?: No Does the patient have difficulty concentrating, remembering, or making decisions?: Yes Patient able to express need for assistance with ADLs?: Yes Does the patient have difficulty dressing or bathing?: No Independently performs ADLs?: Yes (appropriate for developmental age) Does the patient have difficulty walking or climbing stairs?: Yes Weakness of Legs: Both Weakness of Arms/Hands: Both  Emotional Assessment Appearance:: Appears stated age Attitude/Demeanor/Rapport: Engaged Affect (typically observed): Appropriate Orientation: : Oriented to Self,Oriented to Place,Oriented to  Time,Oriented to Situation Alcohol / Substance Use: Not Applicable Psych Involvement: No (comment)  Admission diagnosis:  Osteoarthritis of right knee [M17.11] Patient Active Problem List   Diagnosis Date Noted  . Degenerative arthritis of right knee 11/04/2020  . Osteoarthritis of right knee 11/04/2020  . Aortic atherosclerosis ([place]) 09/19/2020  . History of Helicobacter pylori infection 07/23/2020  . S/P hysterectomy 04/19/2020  . History of vaginal bleeding 04/19/2020  . Fall in bathtub   . Neck pain   . Primary osteoarthritis of both knees 01/27/2019  . Hyperlipidemia 09/26/2017  . Dysphagia 08/15/2016  . Obstructive sleep apnea syndrome 06/27/2016  . Rectal bleeding 04/07/2013  . Melena 03/04/2013  . Hematemesis 03/04/2013  . Abdominal pain, epigastric 03/04/2013  . Hypokalemia 03/04/2013  . Smoker 01/16/2012  . Diarrhea 12/26/2011  . Pulmonary nodule,  right 12/26/2011  . Abdominal pain 10/08/2011  .  Fatty liver 08/24/2011  . Constipation 10/20/2010  . NAUSEA AND VOMITING 06/09/2010  . HEMATOCHEZIA 04/21/2010  . OTHER DYSPHAGIA 04/21/2010  . MILD MENTAL RETARDATION 02/17/2009  . GLAUCOMA 02/17/2009  . ANXIETY 01/15/2009  . MIGRAINE HEADACHE 01/15/2009  . Essential hypertension 01/15/2009  . HEMORRHOIDS 01/15/2009  . GASTROESOPHAGEAL REFLUX DISEASE, CHRONIC 01/15/2009  . CONSTIPATION, CHRONIC 01/15/2009  . IBS (irritable bowel syndrome) 01/15/2009  . KNEE PAIN, CHRONIC 01/15/2009  . BACK PAIN, CHRONIC 01/15/2009  . Hx of adenomatous colonic polyps 01/15/2009   PCP:  Kathyrn Drown, MD Pharmacy:   [facility] [place] [address] [place] [address] Phone: [phone] Fax: [phone]  Readmission Risk Interventions No flowsheet data found.

## 2020-11-06 DIAGNOSIS — Z7984 Long term (current) use of oral hypoglycemic drugs: Secondary | ICD-10-CM | POA: Diagnosis not present

## 2020-11-06 DIAGNOSIS — M1711 Unilateral primary osteoarthritis, right knee: Secondary | ICD-10-CM | POA: Diagnosis not present

## 2020-11-06 DIAGNOSIS — I10 Essential (primary) hypertension: Secondary | ICD-10-CM | POA: Diagnosis not present

## 2020-11-06 DIAGNOSIS — E119 Type 2 diabetes mellitus without complications: Secondary | ICD-10-CM | POA: Diagnosis not present

## 2020-11-06 DIAGNOSIS — Z20822 Contact with and (suspected) exposure to covid-19: Secondary | ICD-10-CM | POA: Diagnosis not present

## 2020-11-06 DIAGNOSIS — F17210 Nicotine dependence, cigarettes, uncomplicated: Secondary | ICD-10-CM | POA: Diagnosis not present

## 2020-11-06 DIAGNOSIS — Z79899 Other long term (current) drug therapy: Secondary | ICD-10-CM | POA: Diagnosis not present

## 2020-11-06 LAB — CBC
HCT: 28.8 % — ABNORMAL LOW (ref 36.0–46.0)
Hemoglobin: 9.6 g/dL — ABNORMAL LOW (ref 12.0–15.0)
MCH: 30.7 pg (ref 26.0–34.0)
MCHC: 33.3 g/dL (ref 30.0–36.0)
MCV: 92 fL (ref 80.0–100.0)
Platelets: 164 10*3/uL (ref 150–400)
RBC: 3.13 MIL/uL — ABNORMAL LOW (ref 3.87–5.11)
RDW: 14.4 % (ref 11.5–15.5)
WBC: 7.1 10*3/uL (ref 4.0–10.5)
nRBC: 0 % (ref 0.0–0.2)

## 2020-11-06 LAB — GLUCOSE, CAPILLARY
Glucose-Capillary: 109 mg/dL — ABNORMAL HIGH (ref 70–99)
Glucose-Capillary: 103 mg/dL — ABNORMAL HIGH (ref 70–99)
Glucose-Capillary: 96 mg/dL (ref 70–99)
Glucose-Capillary: 122 mg/dL — ABNORMAL HIGH (ref 70–99)

## 2020-11-06 NOTE — Plan of Care (Signed)
  Problem: Pain Managment: Goal: General experience of comfort will improve Outcome: Progressing   Problem: Safety: Goal: Ability to remain free from injury will improve Outcome: Progressing   Problem: Clinical Measurements: Goal: Postoperative complications will be avoided or minimized Outcome: Progressing   Problem: Pain Management: Goal: Pain level will decrease with appropriate interventions Outcome: Progressing

## 2020-11-06 NOTE — Progress Notes (Signed)
Physical Therapy Treatment Patient Details Name: [name] MRN: [id number] DOB: 1960/09/10 Today's Date: 11/06/2020    History of Present Illness Pt is 60 yo female s/p R TKA on 11/04/20.  Pt with PMH including mental retardation, DM, HTN, IBS, MI, sleep apnea, and arthritis of knees    PT Comments    Pt more lethargic this afternoon, unsure if related to medication. Pt performs ankle pumps with active assist. Attempted to cue pt with quad set, heel slide and SLR but significant difficulty, unsure if do to cognition or weakness. Pt continues to report "yes" to all questions and doesn't elaborate. Pt able to use UEs to scoot back in recliner with SUPV, but unable to cue for STS due to lethargy. RN notified of session. May need to consider SNF for strengthening and 24 hr assist if not available at home due to slow progress acutely.   Follow Up Recommendations  Follow surgeon's recommendation for DC plan and follow-up therapies;Supervision/Assistance - 24 hour (may need SNF if 24 hr support not in place)     Equipment Recommendations  Rolling walker with 5" wheels    Recommendations for Other Services       Precautions / Restrictions Precautions Precautions: Fall Precaution Comments: R KI due to knee buckling Restrictions Weight Bearing Restrictions: No    Mobility  Bed Mobility     Transfers     Ambulation/Gait      Stairs             Wheelchair Mobility    Modified Rankin (Stroke Patients Only)       Balance         Cognition Arousal/Alertness: Lethargic Behavior During Therapy: Flat affect Overall Cognitive Status: No family/caregiver present to determine baseline cognitive functioning  General Comments: Pt with history of mental retardation, unsure of baseline. Pt lethargic, difficulty following commands, opens eyes for ~3 sec at a time before closing. Pt with tangential speech and attempting to dump something from R<>L hand, but there isn't  anything in pt's hands. RN notified      Exercises Total Joint Exercises Ankle Circles/Pumps: AAROM;Supine;Both;20 reps    General Comments        Pertinent Vitals/Pain Pain Assessment: Faces Faces Pain Scale: Hurts a little bit Pain Location: R knee Pain Descriptors / Indicators: Discomfort;Grimacing;Guarding Pain Intervention(s): Limited activity within patient's tolerance;Monitored during session    Home Living                      Prior Function            PT Goals (current goals can now be found in the care plan section) Acute Rehab PT Goals Patient Stated Goal: go to sleep PT Goal Formulation: With patient Time For Goal Achievement: 11/18/20 Potential to Achieve Goals: Fair Progress towards PT goals: Not progressing toward goals - comment (lethargic)    Frequency    7X/week      PT Plan Current plan remains appropriate    Co-evaluation              AM-PAC PT "6 Clicks" Mobility   Outcome Measure  Help needed turning from your back to your side while in a flat bed without using bedrails?: A Little Help needed moving from lying on your back to sitting on the side of a flat bed without using bedrails?: A Lot Help needed moving to and from a bed to a chair (including a wheelchair)?: Total Help needed  standing up from a chair using your arms (e.g., wheelchair or bedside chair)?: Total Help needed to walk in hospital room?: Total Help needed climbing 3-5 steps with a railing? : Total 6 Click Score: 9    End of Session Equipment Utilized During Treatment: Right knee immobilizer Activity Tolerance: Patient limited by lethargy Patient left: in chair;with call bell/phone within reach;with chair alarm set (knee immobilizer in place) Nurse Communication: Mobility status;Other (comment) (lethargy) PT Visit Diagnosis: Other abnormalities of gait and mobility (R26.89);Muscle weakness (generalized) (M62.81)     Time: 1341-1350 PT Time Calculation  (min) (ACUTE ONLY): 9 min  Charges:  $Therapeutic Exercise: 8-22 mins                      Tori Ervine Witucki PT, DPT 11/06/20, 1:59 PM

## 2020-11-06 NOTE — Progress Notes (Signed)
Physical Therapy Treatment Patient Details Name: [name] MRN: [id number] DOB: 1960/07/23 Today's Date: 11/06/2020    History of Present Illness Pt is 60 yo female s/p R TKA on 11/04/20.  Pt with PMH including mental retardation, DM, HTN, IBS, MI, sleep apnea, and arthritis of knees    PT Comments    Pt progressing very slowly with therapy, significant difficulty following commands requiring multimodal cues step by step and occasional repeat of cues for proper performance. Pt states "yes" to all questions regarding pain, dizziness, but also to feeling strong and feeling fine; unsure of complaints if any? Pt powers to stand 3x with mod A but requires 2nd person for pivot for multimodal cues on opposite side and assist with maneuvering RW. Pt with sudden bil knee buckling once in front of chair requiring max A for controlled lowering to chair. Cued pt through BLE strengthening exercises, requiring active assist for some and therapist cueing pt to not use hands to move BLE through exercise. Pt with poor eccentric lowering with LLE and unable to perform on RLE. Pt reports living alone with CNA and friend assist occasionally. Pt is a high fall risk, limited in ambulation with significant knee buckling despite R KI. Will return for 2nd session this afternoon, but pt likely not ready to d/c home with assist today due to limited steps in room with assistance. Will continue to progress acute PT as able.   Follow Up Recommendations  Follow surgeon's recommendation for DC plan and follow-up therapies;Supervision/Assistance - 24 hour     Equipment Recommendations  Rolling walker with 5" wheels    Recommendations for Other Services       Precautions / Restrictions Precautions Precautions: Fall Precaution Comments: R KI due to knee buckling Restrictions Weight Bearing Restrictions: No    Mobility  Bed Mobility Overal bed mobility: Needs Assistance Bed Mobility: Supine to Sit  Supine to sit:  Mod assist  General bed mobility comments: mod A to upright trunk and bring BLE over to EOB, jerking movements throughout requiring assist to prevent LOB in all directions    Transfers Overall transfer level: Needs assistance Equipment used: Rolling walker (2 wheeled) Transfers: Sit to/from [facility] Sit to Stand: Mod assist;+2 safety/equipment Stand pivot transfers: Mod assist;+2 safety/equipment  General transfer comment: mod A +2 for safety, multimodal cues for hand placement and sequencing, powered to stand 3x but unable to cue to pivot to chair with +1 assist, so +2 for safety and cues with pivot over to recliner, constant step by step cues, then pt with abrupt bil knee buckling requiring max A for controlled return to sit in chair despite R KI on  Ambulation/Gait Ambulation/Gait assistance: Mod assist;+2 safety/equipment  Assistive device: Rolling walker (2 wheeled) Gait Pattern/deviations: Step-to pattern;Decreased weight shift to right;Shuffle;Trunk flexed Gait velocity: decreased   General Gait Details: pt takes 2-3 steps at bedside to pivot to chair, KI on, limited weight-shift R, difficulty following commands requiring constant multimodal cues, significant R knee buckling noted, unsafe to attempt ambulation away from seated surfaces   Stairs             Wheelchair Mobility    Modified Rankin (Stroke Patients Only)       Balance Overall balance assessment: Needs assistance Sitting-balance support: Feet supported;Bilateral upper extremity supported Sitting balance-Leahy Scale: Poor Sitting balance - Comments: mod A to min G sitting EOB with BUE support on bed, noted jerking throughout and LOB all directions   Standing balance  support: During functional activity;Bilateral upper extremity supported Standing balance-Leahy Scale: Poor Standing balance comment: reliant on UE support, knee buckling significant requiring max A to recover          Cognition Arousal/Alertness: Lethargic Behavior During Therapy: WFL for tasks assessed/performed Overall Cognitive Status: No family/caregiver present to determine baseline cognitive functioning  General Comments: Pt with history of mental retardation, unsure of baseline. Pt require moderate multimodal cues for sequencing and hand placement with mobility, able to follow 1 step commands with increased time and occasionally requiring additional cues for performance. Pt maintains eyes closed unless cued to open, but also closes them after opening for ~3 seconds.      Exercises Total Joint Exercises Ankle Circles/Pumps: AAROM;10 reps;Supine;Both Quad Sets: AROM;Left;5 reps;Seated (therapist had to repeatedly remove pt's hands from pushing down on quad, max multimodal cues) Straight Leg Raises: AROM;Strengthening;Left;5 reps;Seated (attempted RLE but unable)    General Comments General comments (skin integrity, edema, etc.): Pt maintains eyes closed unless cued to open, then returns closed within ~3 seconds. Pt with jerking throughout session in all positions, heavy multimodal cues with all mobility, reports "yes" to all questions (pain, dizzy, fine, weak, strong) so unsure of real complaints. RN in room visualized jerking with mobility and sudden bil knee buckling requiring max A despite R KI donned.      Pertinent Vitals/Pain Pain Assessment: 0-10 Pain Score: 9  Pain Location: R knee Pain Descriptors / Indicators: Discomfort;Grimacing;Guarding Pain Intervention(s): Limited activity within patient's tolerance;Monitored during session;Repositioned    Home Living                      Prior Function            PT Goals (current goals can now be found in the care plan section) Acute Rehab PT Goals Patient Stated Goal: go to sleep PT Goal Formulation: With patient Time For Goal Achievement: 11/18/20 Potential to Achieve Goals: Good Progress towards PT goals: Progressing toward  goals (very slow)    Frequency    7X/week      PT Plan Current plan remains appropriate    Co-evaluation              AM-PAC PT "6 Clicks" Mobility   Outcome Measure  Help needed turning from your back to your side while in a flat bed without using bedrails?: A Little Help needed moving from lying on your back to sitting on the side of a flat bed without using bedrails?: A Lot Help needed moving to and from a bed to a chair (including a wheelchair)?: Total Help needed standing up from a chair using your arms (e.g., wheelchair or bedside chair)?: Total Help needed to walk in hospital room?: Total Help needed climbing 3-5 steps with a railing? : Total 6 Click Score: 9    End of Session Equipment Utilized During Treatment: Right knee immobilizer;Gait belt Activity Tolerance: Patient limited by lethargy Patient left: in chair;with call bell/phone within reach;with chair alarm set;with nursing/sitter in room (knee immobilizer in place) Nurse Communication: Mobility status PT Visit Diagnosis: Other abnormalities of gait and mobility (R26.89);Muscle weakness (generalized) (M62.81)     Time: 6759-1638 PT Time Calculation (min) (ACUTE ONLY): 25 min  Charges:  $Therapeutic Exercise: 8-22 mins $Therapeutic Activity: 8-22 mins                      Tori Nela Bascom PT, DPT 11/06/20, 8:51 AM

## 2020-11-06 NOTE — Progress Notes (Signed)
Subjective: 2 Days Post-Op Procedure(s) (LRB): COMPUTER ASSISTED TOTAL KNEE ARTHROPLASTY (Right) Patient reports pain as moderate.   Reports pain is some better today.  Objective: Vital signs in last 24 hours: Temp:  [98 F (36.7 C)-98.6 F (37 C)] 98.5 F (36.9 C) (04/30 0527) Pulse Rate:  [76-85] 85 (04/30 0527) Resp:  [16-18] 16 (04/30 0527) BP: (103-131)/(60-75) 103/64 (04/30 0527) SpO2:  [94 %-99 %] 99 % (04/30 0527)  Intake/Output from previous day: 04/29 0701 - 04/30 0700 In: 1130.2 [P.O.:720; I.V.:410.2] Out: 2250 [Urine:2250] Intake/Output this shift: Total I/O In: -  Out: 150 [Urine:150]  Recent Labs    11/05/20 0340 11/06/20 0258  HGB 10.6* 9.6*   Recent Labs    11/05/20 0340 11/06/20 0258  WBC 9.2 7.1  RBC 3.44* 3.13*  HCT 31.4* 28.8*  PLT 150 164   Recent Labs    11/05/20 0340  NA 139  K 4.0  CL 105  CO2 28  BUN <5*  CREATININE 0.57  GLUCOSE 108*  CALCIUM 8.2*   No results for input(s): LABPT, INR in the last 72 hours.  Neurologically intact ABD soft Neurovascular intact Sensation intact distally Intact pulses distally Dorsiflexion/Plantar flexion intact Incision: dressing C/D/I and scant drainage No cellulitis present Compartment soft no sign of DVT   Assessment/Plan: 2 Days Post-Op Procedure(s) (LRB): COMPUTER ASSISTED TOTAL KNEE ARTHROPLASTY (Right) Advance diet Up with therapy D/C IV fluids Slow progress with PT- plan for D/C when PT clears her  [name] 11/06/2020, 10:53 AM

## 2020-11-07 DIAGNOSIS — M1711 Unilateral primary osteoarthritis, right knee: Secondary | ICD-10-CM | POA: Diagnosis not present

## 2020-11-07 DIAGNOSIS — E119 Type 2 diabetes mellitus without complications: Secondary | ICD-10-CM | POA: Diagnosis not present

## 2020-11-07 DIAGNOSIS — Z7984 Long term (current) use of oral hypoglycemic drugs: Secondary | ICD-10-CM | POA: Diagnosis not present

## 2020-11-07 DIAGNOSIS — Z79899 Other long term (current) drug therapy: Secondary | ICD-10-CM | POA: Diagnosis not present

## 2020-11-07 DIAGNOSIS — Z20822 Contact with and (suspected) exposure to covid-19: Secondary | ICD-10-CM | POA: Diagnosis not present

## 2020-11-07 DIAGNOSIS — F17210 Nicotine dependence, cigarettes, uncomplicated: Secondary | ICD-10-CM | POA: Diagnosis not present

## 2020-11-07 DIAGNOSIS — I10 Essential (primary) hypertension: Secondary | ICD-10-CM | POA: Diagnosis not present

## 2020-11-07 LAB — GLUCOSE, CAPILLARY
Glucose-Capillary: 93 mg/dL (ref 70–99)
Glucose-Capillary: 123 mg/dL — ABNORMAL HIGH (ref 70–99)
Glucose-Capillary: 84 mg/dL (ref 70–99)
Glucose-Capillary: 118 mg/dL — ABNORMAL HIGH (ref 70–99)

## 2020-11-07 LAB — CBC
HCT: 27 % — ABNORMAL LOW (ref 36.0–46.0)
Hemoglobin: 8.9 g/dL — ABNORMAL LOW (ref 12.0–15.0)
MCH: 30.5 pg (ref 26.0–34.0)
MCHC: 33 g/dL (ref 30.0–36.0)
MCV: 92.5 fL (ref 80.0–100.0)
Platelets: 152 10*3/uL (ref 150–400)
RBC: 2.92 MIL/uL — ABNORMAL LOW (ref 3.87–5.11)
RDW: 14.1 % (ref 11.5–15.5)
WBC: 7.9 10*3/uL (ref 4.0–10.5)
nRBC: 0 % (ref 0.0–0.2)

## 2020-11-07 NOTE — Progress Notes (Signed)
    Subjective:  Patient reports pain as mild to moderate.  Denies N/V/CP/SOB. No c/o.  Objective:   VITALS:   Vitals:   11/06/20 0527 11/06/20 1948 11/06/20 2148 11/07/20 0557  BP: 103/64 121/86 116/68 (!) 109/57  Pulse: 85 88 86 83  Resp: 16 16 16 16   Temp: 98.5 F (36.9 C) 99.1 F (37.3 C) 98.9 F (37.2 C) 99.8 F (37.7 C)  TempSrc: Oral Oral Oral Oral  SpO2: 99% (!) 88% 92% 90%  Weight:      Height:        NAD, awake and alert ABD soft Sensation intact distally Intact pulses distally Dorsiflexion/Plantar flexion intact Incision: dressing C/D/I Compartment soft Poor quad stength  Lab Results  Component Value Date   WBC 7.9 11/07/2020   HGB 8.9 (L) 11/07/2020   HCT 27.0 (L) 11/07/2020   MCV 92.5 11/07/2020   PLT 152 11/07/2020   BMET    Component Value Date/Time   NA 139 11/05/2020 0340   NA 137 07/16/2020 1111   K 4.0 11/05/2020 0340   CL 105 11/05/2020 0340   CO2 28 11/05/2020 0340   GLUCOSE 108 (H) 11/05/2020 0340   BUN <5 (L) 11/05/2020 0340   BUN 4 (L) 07/16/2020 1111   CREATININE 0.57 11/05/2020 0340   CREATININE 0.84 05/01/2013 1044   CALCIUM 8.2 (L) 11/05/2020 0340   GFRNONAA >60 11/05/2020 0340   GFRAA 92 07/16/2020 1111     Assessment/Plan: 3 Days Post-Op   Principal Problem:   Degenerative arthritis of right knee Active Problems:   Osteoarthritis of right knee   WBAT with walker DVT ppx: Apixaban, SCDs, TEDS PO pain control PT/OT: progressing very slowly Dispo: SNF placement unless she rapidly improves w/ PT   [name] [name] 11/07/2020, 8:07 AM   Rod Can, MD [phone] [place] is now [facility]  [place] [address], [address], [place], [place] [address] Phone: [phone] [email or web address] Facebook  [employer]

## 2020-11-07 NOTE — Progress Notes (Signed)
Physical Therapy Treatment Patient Details Name: [name] MRN: [id number] DOB: October 15, 1960 Today's Date: 11/07/2020    History of Present Illness Pt is 60 yo female s/p R TKA on 11/04/20.  Pt with PMH including mental retardation, DM, HTN, IBS, MI, sleep apnea, and arthritis of knees    PT Comments    Pt progressing slowly with mobility; continues to require +2 for safety with any standing activity. Recommend SNF post acute to maximize pt's independence.  Follow Up Recommendations  Follow surgeon's recommendation for DC plan and follow-up therapies;SNF     Equipment Recommendations  Rolling walker with 5" wheels    Recommendations for Other Services       Precautions / Restrictions Precautions Precautions: Fall;Knee Required Braces or Orthoses: Knee Immobilizer - Right Restrictions RLE Weight Bearing: Weight bearing as tolerated    Mobility  Bed Mobility Overal bed mobility: Needs Assistance Bed Mobility: Supine to Sit     Supine to sit: Min assist;+2 for safety/equipment     General bed mobility comments: multi-modal cues for  initiation, assist with RLE    Transfers Overall transfer level: Needs assistance Equipment used: Rolling walker (2 wheeled) Transfers: Sit to/from [facility] Sit to Stand: Mod assist;+2 safety/equipment;Min assist;+2 physical assistance Stand pivot transfers: Mod assist;+2 safety/equipment       General transfer comment: pt with muscle twitching and  UEs losing grip at times. step by step cues for sequence of task, incr time needed. limited WBing RLE during standing. +2 needed throughout for safety  Ambulation/Gait             General Gait Details: pt takes 2-3 steps at bedside to pivot to chair, KI on, limited weight-shift R, difficulty following commands requiring constant multimodal cues- unsafe to attempt ambulation away from seated surfaces d/t pt losing hand grip on RW, difficulty following  commands   Stairs             Wheelchair Mobility    Modified Rankin (Stroke Patients Only)       Balance                                            Cognition Arousal/Alertness: Awake/alert (sleepy but arouses) Behavior During Therapy: Flat affect Overall Cognitive Status: No family/caregiver present to determine baseline cognitive functioning                                 General Comments: pt with hx of MR, requires incr time to respond at times      Exercises Total Joint Exercises Ankle Circles/Pumps: AROM;Both;10 reps Quad Sets: Right;AROM;5 reps Straight Leg Raises: AAROM;AROM;5 reps;Right    General Comments        Pertinent Vitals/Pain Pain Assessment: Faces Faces Pain Scale: Hurts a little bit Pain Location: R knee Pain Descriptors / Indicators: Discomfort;Grimacing;Guarding Pain Intervention(s): Limited activity within patient's tolerance;Monitored during session;Repositioned    Home Living                      Prior Function            PT Goals (current goals can now be found in the care plan section) Acute Rehab PT Goals Patient Stated Goal: go to sleep PT Goal Formulation: With patient Time For Goal Achievement: 11/18/20  Potential to Achieve Goals: Fair Progress towards PT goals: Progressing toward goals (slowly)    Frequency    7X/week      PT Plan Current plan remains appropriate    Co-evaluation              AM-PAC PT "6 Clicks" Mobility   Outcome Measure  Help needed turning from your back to your side while in a flat bed without using bedrails?: A Little Help needed moving from lying on your back to sitting on the side of a flat bed without using bedrails?: A Little Help needed moving to and from a bed to a chair (including a wheelchair)?: A Lot Help needed standing up from a chair using your arms (e.g., wheelchair or bedside chair)?: A Lot Help needed to walk in hospital  room?: Total Help needed climbing 3-5 steps with a railing? : Total 6 Click Score: 12    End of Session Equipment Utilized During Treatment: Gait belt;Right knee immobilizer Activity Tolerance: Patient tolerated treatment well;Other (comment) (limited by cognition) Patient left: in chair;with call bell/phone within reach;with chair alarm set   PT Visit Diagnosis: Other abnormalities of gait and mobility (R26.89);Muscle weakness (generalized) (M62.81)     Time: 5638-9373 PT Time Calculation (min) (ACUTE ONLY): 14 min  Charges:  $Therapeutic Activity: 8-22 mins                     Baxter Flattery, PT  Acute Rehab Dept ([place]) [phone] Pager [phone]  11/07/2020    [facility] 11/07/2020, 2:56 PM

## 2020-11-07 NOTE — Plan of Care (Signed)
  Problem: Health Behavior/Discharge Planning: Goal: Ability to manage health-related needs will improve Outcome: Progressing   Problem: Activity: Goal: Risk for activity intolerance will decrease Outcome: Progressing   

## 2020-11-08 DIAGNOSIS — I10 Essential (primary) hypertension: Secondary | ICD-10-CM | POA: Diagnosis not present

## 2020-11-08 DIAGNOSIS — F17210 Nicotine dependence, cigarettes, uncomplicated: Secondary | ICD-10-CM | POA: Diagnosis not present

## 2020-11-08 DIAGNOSIS — Z7984 Long term (current) use of oral hypoglycemic drugs: Secondary | ICD-10-CM | POA: Diagnosis not present

## 2020-11-08 DIAGNOSIS — Z471 Aftercare following joint replacement surgery: Secondary | ICD-10-CM | POA: Diagnosis not present

## 2020-11-08 DIAGNOSIS — R262 Difficulty in walking, not elsewhere classified: Secondary | ICD-10-CM | POA: Diagnosis not present

## 2020-11-08 DIAGNOSIS — M6281 Muscle weakness (generalized): Secondary | ICD-10-CM | POA: Diagnosis not present

## 2020-11-08 DIAGNOSIS — Z20822 Contact with and (suspected) exposure to covid-19: Secondary | ICD-10-CM | POA: Diagnosis not present

## 2020-11-08 DIAGNOSIS — M25561 Pain in right knee: Secondary | ICD-10-CM | POA: Diagnosis not present

## 2020-11-08 DIAGNOSIS — Z79899 Other long term (current) drug therapy: Secondary | ICD-10-CM | POA: Diagnosis not present

## 2020-11-08 DIAGNOSIS — M1711 Unilateral primary osteoarthritis, right knee: Secondary | ICD-10-CM | POA: Diagnosis not present

## 2020-11-08 DIAGNOSIS — E119 Type 2 diabetes mellitus without complications: Secondary | ICD-10-CM | POA: Diagnosis not present

## 2020-11-08 LAB — TYPE AND SCREEN
ABO/RH(D): B POS
Antibody Screen: POSITIVE
Donor AG Type: NEGATIVE
Donor AG Type: NEGATIVE
Unit division: 0
Unit division: 0

## 2020-11-08 LAB — BPAM RBC
Blood Product Expiration Date: 202205142359
Blood Product Expiration Date: 202205242359
Unit Type and Rh: 5100
Unit Type and Rh: 7300

## 2020-11-08 LAB — GLUCOSE, CAPILLARY
Glucose-Capillary: 89 mg/dL (ref 70–99)
Glucose-Capillary: 109 mg/dL — ABNORMAL HIGH (ref 70–99)
Glucose-Capillary: 105 mg/dL — ABNORMAL HIGH (ref 70–99)
Glucose-Capillary: 121 mg/dL — ABNORMAL HIGH (ref 70–99)

## 2020-11-08 NOTE — Progress Notes (Signed)
    Subjective:  Patient reports pain as mild to moderate.  Denies N/V/CP/SOB. No complaints this AM  Objective:   VITALS:   Vitals:   11/07/20 1007 11/07/20 1316 11/07/20 2108 11/08/20 0610  BP: 104/61 117/69 102/66 (!) 116/49  Pulse:  78 81 83  Resp:  17 18 16   Temp:  98.2 F (36.8 C) 100 F (37.8 C) (!) 97.5 F (36.4 C)  TempSrc:  Oral Oral Oral  SpO2:  94% 96% 91%  Weight:      Height:        NAD, awake and alert ABD soft Sensation intact distally Intact pulses distally Dorsiflexion/Plantar flexion intact Incision: dressing C/D/I Compartment soft Poor quad stength  Lab Results  Component Value Date   WBC 7.9 11/07/2020   HGB 8.9 (L) 11/07/2020   HCT 27.0 (L) 11/07/2020   MCV 92.5 11/07/2020   PLT 152 11/07/2020   BMET    Component Value Date/Time   NA 139 11/05/2020 0340   NA 137 07/16/2020 1111   K 4.0 11/05/2020 0340   CL 105 11/05/2020 0340   CO2 28 11/05/2020 0340   GLUCOSE 108 (H) 11/05/2020 0340   BUN <5 (L) 11/05/2020 0340   BUN 4 (L) 07/16/2020 1111   CREATININE 0.57 11/05/2020 0340   CREATININE 0.84 05/01/2013 1044   CALCIUM 8.2 (L) 11/05/2020 0340   GFRNONAA >60 11/05/2020 0340   GFRAA 92 07/16/2020 1111     Assessment/Plan: 4 Days Post-Op   Principal Problem:   Degenerative arthritis of right knee Active Problems:   Osteoarthritis of right knee   WBAT with walker DVT ppx: Apixaban, SCDs, TEDS PO pain control PT/OT: progressing very slowly Dispo: SNF placement unless she rapidly improves w/ PT   [name] 11/08/2020, 9:20 AM  [facility] is now [facility] [address], [place], [place] [address] Phone: [phone] [email or web address] Facebook  [employer]

## 2020-11-08 NOTE — Care Management (Cosign Needed)
RE: [name]. Jimerson DOB: 07/09/1961 Date: 11/08/2020 MUST ID: [id number]   To Whom It May Concern:   Please be advised that the above name patient will require a short-term nursing home stay--anticipated 30 days or less rehabilitation and strengthening. The plan is for return home.

## 2020-11-08 NOTE — Progress Notes (Signed)
RE: [name]. MooreDOB: 1960-11-24 Date: 11/08/2020 MUST ID: [id number]   To Whom It May Concern:   Please be advised that the above name patient will require a short-term nursing home stay--anticipated 30 days or less rehabilitation and strengthening. The plan is for return home.

## 2020-11-08 NOTE — Progress Notes (Signed)
Physical Therapy Treatment Patient Details Name: [name] MRN: [id number] DOB: [birth year] Today's Date: 11/08/2020    History of Present Illness Pt is 60 yo female s/p R TKA on 11/04/20.  Pt with PMH including mental retardation, DM, HTN, IBS, MI, sleep apnea, and arthritis of knees    PT Comments    Pt with episode of knees buckling without warning this pm requiring +2 assist for  Fall prevention. Pt continues to be at significant risk for falls and will need SNF post acute   Follow Up Recommendations  Follow surgeon's recommendation for DC plan and follow-up therapies;SNF     Equipment Recommendations  Rolling walker with 5" wheels    Recommendations for Other Services       Precautions / Restrictions Precautions Precautions: Fall;Knee Precaution Comments: pt requested KI to be removed stating it makes her "worse" Required Braces or Orthoses: Knee Immobilizer - Right Restrictions RLE Weight Bearing: Weight bearing as tolerated    Mobility  Bed Mobility Overal bed mobility: Needs Assistance Bed Mobility: Supine to Sit;Sit to Supine       Sit to supine: Min assist   General bed mobility comments: assist with LEs    Transfers Overall transfer level: Needs assistance Equipment used: Rolling walker (2 wheeled) Transfers: Sit to/from [employer] Transfers Sit to Stand: +2 safety/equipment;Min assist;Mod assist Stand pivot transfers: +2 safety/equipment;+2 physical assistance;Mod assist;Max assist       General transfer comment: pt with episode of bil knees buckling with transition from [facility]  requiring +2 assist and manipulation of environment to prevent fall  Ambulation/Gait                 Stairs             Wheelchair Mobility    Modified Rankin (Stroke Patients Only)       Balance                                            Cognition Arousal/Alertness: Awake/alert (more sleepy this pm but awake) Behavior  During Therapy: Impulsive Overall Cognitive Status: No family/caregiver present to determine baseline cognitive functioning                                 General Comments: pt with hx of MR, more sleepy, pt with knees buckling unexpectedly      Exercises Total Joint Exercises Ankle Circles/Pumps: AROM;Both;10 reps    General Comments        Pertinent Vitals/Pain Pain Assessment: Faces Faces Pain Scale: Hurts a little bit Pain Location: R knee Pain Descriptors / Indicators: Discomfort;Grimacing;Guarding Pain Intervention(s): Limited activity within patient's tolerance;Monitored during session;Repositioned    Home Living                      Prior Function            PT Goals (current goals can now be found in the care plan section) Acute Rehab PT Goals Patient Stated Goal: go to sleep PT Goal Formulation: With patient Time For Goal Achievement: 11/18/20 Potential to Achieve Goals: Fair Progress towards PT goals: Progressing toward goals    Frequency    7X/week      PT Plan Current plan remains appropriate    Co-evaluation  AM-PAC PT "6 Clicks" Mobility   Outcome Measure  Help needed turning from your back to your side while in a flat bed without using bedrails?: A Little Help needed moving from lying on your back to sitting on the side of a flat bed without using bedrails?: A Little Help needed moving to and from a bed to a chair (including a wheelchair)?: A Lot Help needed standing up from a chair using your arms (e.g., wheelchair or bedside chair)?: A Lot Help needed to walk in hospital room?: A Lot Help needed climbing 3-5 steps with a railing? : Total 6 Click Score: 13    End of Session Equipment Utilized During Treatment: Gait belt Activity Tolerance: Patient tolerated treatment well Patient left: with call bell/phone within reach;in bed;with bed alarm set   PT Visit Diagnosis: Other abnormalities of gait  and mobility (R26.89);Muscle weakness (generalized) (M62.81)     Time: 1525-1550 PT Time Calculation (min) (ACUTE ONLY): 25 min  Charges:  $Therapeutic Activity: 23-37 mins                     Baxter Flattery, PT  Acute Rehab Dept ([place]) [phone] Pager [phone]  11/08/2020    [facility] 11/08/2020, 4:32 PM

## 2020-11-08 NOTE — Progress Notes (Signed)
Physical Therapy Treatment Patient Details Name: [name] MRN: [id number] DOB: 1961/06/10 Today's Date: 11/08/2020    History of Present Illness Pt is 60 yo female s/p R TKA on 11/04/20.  Pt with PMH including mental retardation, DM, HTN, IBS, MI, sleep apnea, and arthritis of knees    PT Comments    Charlett Nose is more alert with improved ability to follow commands today. Able to amb short distance with min assist, step by step cues, heavily reliant on UEs.  Pt states she wants to go home, she endorses she only really has help from her aide a few hrs per day; pt would likely need at least initial 24 hour assist. Her frequent uncontrolled movements of extremities place her at risk for falls (pt reports these movements are worse than her baseline). Given these factors continue to recommend SNF post acute   Follow Up Recommendations  Follow surgeon's recommendation for DC plan and follow-up therapies;SNF     Equipment Recommendations  Rolling walker with 5" wheels    Recommendations for Other Services       Precautions / Restrictions Precautions Precautions: Fall;Knee Precaution Comments: pt requested KI to be removed stating it makes her "worse" Required Braces or Orthoses: Knee Immobilizer - Right Restrictions RLE Weight Bearing: Weight bearing as tolerated    Mobility  Bed Mobility Overal bed mobility: Needs Assistance Bed Mobility: Supine to Sit     Supine to sit: Min guard;HOB elevated     General bed mobility comments: incr time, min/guard for safety    Transfers Overall transfer level: Needs assistance Equipment used: Rolling walker (2 wheeled) Transfers: Sit to/from [employer] Sit to Stand: +2 safety/equipment;Min assist Stand pivot transfers: +2 safety/equipment;Min assist;+2 physical assistance       General transfer comment: pt with muscle twitching (however less than yesterday) and  UEs losing grip at times. step by step cues for sequence  of task, incr time needed. limited WBing RLE during standing. +2 needed throughout for safety  Ambulation/Gait Ambulation/Gait assistance: Min assist;+2 safety/equipment Gait Distance (Feet): 6 Feet Assistive device: Rolling walker (2 wheeled) Gait Pattern/deviations: Step-to pattern;Decreased step length - left;Decreased weight shift to right Gait velocity: decreased   General Gait Details: pt able to take steps from [facility] to recliner with step by step cues for sequence and safety   Stairs             Wheelchair Mobility    Modified Rankin (Stroke Patients Only)       Balance     Sitting balance-Leahy Scale: Fair     Standing balance support: During functional activity;Bilateral upper extremity supported Standing balance-Leahy Scale: Poor Standing balance comment: reliant on UE support, min/guard for safety                            Cognition Arousal/Alertness: Awake/alert Behavior During Therapy: WFL for tasks assessed/performed Overall Cognitive Status: No family/caregiver present to determine baseline cognitive functioning                                 General Comments: pt with hx of MR, more alert today, following one step commands with decr time and incr accuracy      Exercises Total Joint Exercises Ankle Circles/Pumps: AROM;Both;10 reps    General Comments        Pertinent Vitals/Pain Pain Assessment: Faces Faces Pain Scale:  Hurts a little bit Pain Location: R knee Pain Descriptors / Indicators: Discomfort;Grimacing;Guarding Pain Intervention(s): Limited activity within patient's tolerance;Monitored during session;Repositioned    Home Living                      Prior Function            PT Goals (current goals can now be found in the care plan section) Acute Rehab PT Goals Patient Stated Goal: go to sleep PT Goal Formulation: With patient Time For Goal Achievement: 11/18/20 Potential to Achieve Goals:  Fair Progress towards PT goals: Progressing toward goals    Frequency    7X/week      PT Plan Current plan remains appropriate    Co-evaluation              AM-PAC PT "6 Clicks" Mobility   Outcome Measure  Help needed turning from your back to your side while in a flat bed without using bedrails?: A Little Help needed moving from lying on your back to sitting on the side of a flat bed without using bedrails?: A Little Help needed moving to and from a bed to a chair (including a wheelchair)?: A Little Help needed standing up from a chair using your arms (e.g., wheelchair or bedside chair)?: A Little Help needed to walk in hospital room?: A Lot Help needed climbing 3-5 steps with a railing? : Total 6 Click Score: 15    End of Session Equipment Utilized During Treatment: Gait belt;Right knee immobilizer (KI removed after SPT) Activity Tolerance: Patient tolerated treatment well Patient left: in chair;with call bell/phone within reach;with chair alarm set   PT Visit Diagnosis: Other abnormalities of gait and mobility (R26.89);Muscle weakness (generalized) (M62.81)     Time: 1607-3710 PT Time Calculation (min) (ACUTE ONLY): 25 min  Charges:  $Gait Training: 8-22 mins $Therapeutic Activity: 8-22 mins                     Baxter Flattery, PT  Acute Rehab Dept ([place]) [phone] Pager [phone]  11/08/2020    [facility] 11/08/2020, 12:12 PM

## 2020-11-08 NOTE — TOC Progression Note (Addendum)
Transition of Care [facility]) - Progression Note   Patient Details  Name: [name] MRN: [id number] Date of Birth: 12-Dec-1960  Transition of Care [facility]) CM/SW [place], LCSW Phone Number: 11/08/2020, 1:08 PM  Clinical Narrative: Due to slow progress with PT, PT evaluation now recommends SNF. CSW spoke with patient's DSS caseworker, [name], regarding SNF as she oversees the patient's care. [name] agreeable to SNF and is requesting a facility in [place] so patient can receive family visitors if she chooses. CSW explained to patient she will go to SNF, so she is aware. FL2 done; PASRR pending due to needing level 2 review by the state which will require a signed FL2 and 30 day nursing home stay note.  Patient received bed offer for [facility] and [name] has accepted the bed offer. CSW spoke with [name] at [facility] who reported patient can come tomorrow pending a negative COVID test and having a PASRR number. CSW called [name] to start insurance authorization. Reference # is: [id number]. Clinicals have been faxed to [facility] for review. TOC awaiting PASRR number/documentation and insurance authorization.  Addendum: Requested documents have been signed and uploaded to [facility]. Awaiting PASRR number.  Expected Discharge Plan: [place] Barriers to Discharge: Continued Medical Work up  Expected Discharge Plan and Services Expected Discharge Plan: [place] In-house Referral: Clinical Social Work Post Acute Care Choice: [place] Living arrangements for the past 2 months: [place] Expected Discharge Date: 11/05/20               DME Arranged: N/A DME Agency: NA HH Arranged: NA [place] Agency: NA  Readmission Risk Interventions No flowsheet data found.

## 2020-11-08 NOTE — NC FL2 (Signed)
[place] LEVEL OF CARE SCREENING TOOL     IDENTIFICATION  Patient Name: [name] Birthdate: 1960/11/12 Sex: female Admission Date (Current Location): 11/04/2020  [employer] [facility] [place] Number:  Kathleen Argue [id number] [place] and Address:  [facility],  [place] [address] [place]      Provider Number: [id number]  Attending Physician Name and Address:  Rod Can, MD  Relative Name and Phone Number:  Foye Spurling (DSS SW) Ph: [phone]    Current Level of Care: Hospital Recommended Level of Care: [place] Prior Approval Number:    Date Approved/Denied:   PASRR Number:    Discharge Plan: SNF    Current Diagnoses: Patient Active Problem List   Diagnosis Date Noted  . Degenerative arthritis of right knee 11/04/2020  . Osteoarthritis of right knee 11/04/2020  . Aortic atherosclerosis ([place]) 09/19/2020  . History of Helicobacter pylori infection 07/23/2020  . S/P hysterectomy 04/19/2020  . History of vaginal bleeding 04/19/2020  . Fall in bathtub   . Neck pain   . Primary osteoarthritis of both knees 01/27/2019  . Hyperlipidemia 09/26/2017  . Dysphagia 08/15/2016  . Obstructive sleep apnea syndrome 06/27/2016  . Rectal bleeding 04/07/2013  . Melena 03/04/2013  . Hematemesis 03/04/2013  . Abdominal pain, epigastric 03/04/2013  . Hypokalemia 03/04/2013  . Smoker 01/16/2012  . Diarrhea 12/26/2011  . Pulmonary nodule, right 12/26/2011  . Abdominal pain 10/08/2011  . Fatty liver 08/24/2011  . Constipation 10/20/2010  . NAUSEA AND VOMITING 06/09/2010  . HEMATOCHEZIA 04/21/2010  . OTHER DYSPHAGIA 04/21/2010  . MILD MENTAL RETARDATION 02/17/2009  . GLAUCOMA 02/17/2009  . ANXIETY 01/15/2009  . MIGRAINE HEADACHE 01/15/2009  . Essential hypertension 01/15/2009  . HEMORRHOIDS 01/15/2009  . GASTROESOPHAGEAL REFLUX DISEASE, CHRONIC 01/15/2009  . CONSTIPATION, CHRONIC 01/15/2009  . IBS (irritable bowel  syndrome) 01/15/2009  . KNEE PAIN, CHRONIC 01/15/2009  . BACK PAIN, CHRONIC 01/15/2009  . Hx of adenomatous colonic polyps 01/15/2009    Orientation RESPIRATION BLADDER Height & Weight     Self,Time,Situation,Place  Normal Continent Weight: 187 lb (84.8 kg) Height:  5\' 10"  (177.8 cm)  BEHAVIORAL SYMPTOMS/MOOD NEUROLOGICAL BOWEL NUTRITION STATUS      Continent Diet (Carb modified)  AMBULATORY STATUS COMMUNICATION OF NEEDS Skin   Extensive Assist Verbally Surgical wounds                       Personal Care Assistance Level of Assistance  Bathing,Feeding,Dressing Bathing Assistance: Limited assistance Feeding assistance: Independent Dressing Assistance: Limited assistance     Functional Limitations Info  Sight,Hearing,Speech Sight Info: Adequate Hearing Info: Adequate Speech Info: Adequate    SPECIAL CARE FACTORS FREQUENCY  PT (By licensed PT),OT (By licensed OT)     PT Frequency: 5x's/week OT Frequency: 5x's/week            Contractures Contractures Info: Not present    Additional Factors Info  Code Status,Allergies,Psychotropic,Insulin Sliding Scale Code Status Info: Full Allergies Info: Thorazine (Chlorpromazine Hcl), Acetaminophen, Aspirin, Aspirin-acetaminophen-caffeine, Nsaids, Penicillins, Tomato Psychotropic Info: Lamictal (lamotrigine), Zoloft (sertraline), Desyrel (trazadone) Insulin Sliding Scale Info: See discharge summary       Current Medications (11/08/2020):  This is the current hospital active medication list Current Facility-Administered Medications  Medication Dose Route Frequency Provider Last Rate Last Admin  . 0.9 %  sodium chloride infusion   Intravenous Continuous Rod Can, MD   Stopped at 11/05/20 0935  . alum & mag hydroxide-simeth (MAALOX/MYLANTA) 200-200-20 MG/5ML suspension 30  mL  30 mL Oral Q4H PRN Swinteck, Aaron Edelman, MD      . apixaban Arne Cleveland) tablet 2.5 mg  2.5 mg Oral Q12H Rod Can, MD   2.5 mg at 11/08/20 0809  .  atorvastatin (LIPITOR) tablet 10 mg  10 mg Oral Daily Rod Can, MD   10 mg at 11/07/20 1010  . benztropine (COGENTIN) tablet 1 mg  1 mg Oral Daily Swinteck, Aaron Edelman, MD   1 mg at 11/07/20 1009  . cycloSPORINE (RESTASIS) 0.05 % ophthalmic emulsion 1 drop  1 drop Both Eyes BID Rod Can, MD   1 drop at 11/07/20 2242  . dicyclomine (BENTYL) capsule 10 mg  10 mg Oral TID Orson Aloe, MD   10 mg at 11/08/20 0809  . diphenhydrAMINE (BENADRYL) 12.5 MG/5ML elixir 12.5-25 mg  12.5-25 mg Oral Q4H PRN Swinteck, Aaron Edelman, MD      . docusate sodium (COLACE) capsule 100 mg  100 mg Oral BID Rod Can, MD   100 mg at 11/07/20 2242  . HYDROmorphone (DILAUDID) injection 0.5-1 mg  0.5-1 mg Intravenous Q4H PRN Swinteck, Aaron Edelman, MD      . insulin aspart (novoLOG) injection 0-5 Units  0-5 Units Subcutaneous QHS Swinteck, Aaron Edelman, MD      . insulin aspart (novoLOG) injection 0-9 Units  0-9 Units Subcutaneous TID WC Rod Can, MD   1 Units at 11/07/20 1345  . lamoTRIgine (LAMICTAL) tablet 100 mg  100 mg Oral BID Rod Can, MD   100 mg at 11/07/20 2240  . menthol-cetylpyridinium (CEPACOL) lozenge 3 mg  1 lozenge Oral PRN Swinteck, Aaron Edelman, MD       Or  . phenol (CHLORASEPTIC) mouth spray 1 spray  1 spray Mouth/Throat PRN Swinteck, Aaron Edelman, MD      . methocarbamol (ROBAXIN) tablet 500 mg  500 mg Oral Q6H PRN Rod Can, MD   500 mg at 11/05/20 6270   Or  . methocarbamol (ROBAXIN) 500 mg in dextrose 5 % 50 mL IVPB  500 mg Intravenous Q6H PRN Swinteck, Aaron Edelman, MD      . metoCLOPramide (REGLAN) tablet 5-10 mg  5-10 mg Oral Q8H PRN Swinteck, Aaron Edelman, MD       Or  . metoCLOPramide (REGLAN) injection 5-10 mg  5-10 mg Intravenous Q8H PRN Swinteck, Aaron Edelman, MD      . ondansetron (ZOFRAN) tablet 4 mg  4 mg Oral Q6H PRN Swinteck, Aaron Edelman, MD       Or  . ondansetron (ZOFRAN) injection 4 mg  4 mg Intravenous Q6H PRN Swinteck, Aaron Edelman, MD      . oxyCODONE (Oxy IR/ROXICODONE) immediate release tablet 10-15 mg   10-15 mg Oral Q4H PRN Swinteck, Aaron Edelman, MD      . oxyCODONE (Oxy IR/ROXICODONE) immediate release tablet 5-10 mg  5-10 mg Oral Q4H PRN Rod Can, MD   5 mg at 11/06/20 1327  . oxyCODONE (OXYCONTIN) 12 hr tablet 10 mg  10 mg Oral Q12H Rod Can, MD   10 mg at 11/07/20 2240  . paliperidone (INVEGA) 24 hr tablet 9 mg  9 mg Oral Daily Rod Can, MD   9 mg at 11/07/20 1011  . pantoprazole (PROTONIX) EC tablet 40 mg  40 mg Oral Daily Rod Can, MD   40 mg at 11/07/20 1010  . polyethylene glycol (MIRALAX / GLYCOLAX) packet 17 g  17 g Oral Daily PRN Swinteck, Aaron Edelman, MD      . polyethylene glycol (MIRALAX / GLYCOLAX) packet 17 g  17 g Oral Daily  PRN Swinteck, Aaron Edelman, MD      . potassium chloride (KLOR-CON) CR tablet 20 mEq  20 mEq Oral BID Rod Can, MD   20 mEq at 11/07/20 2240  . propranolol (INDERAL) tablet 10 mg  10 mg Oral TID Rod Can, MD   10 mg at 11/07/20 2240  . senna (SENOKOT) tablet 8.6 mg  1 tablet Oral BID Rod Can, MD   8.6 mg at 11/07/20 2240  . sertraline (ZOLOFT) tablet 100 mg  100 mg Oral Daily Rod Can, MD   100 mg at 11/07/20 1010  . torsemide (DEMADEX) tablet 20 mg  20 mg Oral Daily Rod Can, MD   20 mg at 11/06/20 0831  . traZODone (DESYREL) tablet 200 mg  200 mg Oral QHS Rod Can, MD   200 mg at 11/07/20 2241     Discharge Medications: Please see discharge summary for a list of discharge medications.  Relevant Imaging Results:  Relevant Lab Results:   Additional Information SSN: [id number]  Sherie Don, LCSW

## 2020-11-09 ENCOUNTER — Telehealth: Payer: Self-pay

## 2020-11-09 DIAGNOSIS — Z20822 Contact with and (suspected) exposure to covid-19: Secondary | ICD-10-CM | POA: Diagnosis not present

## 2020-11-09 DIAGNOSIS — F17210 Nicotine dependence, cigarettes, uncomplicated: Secondary | ICD-10-CM | POA: Diagnosis not present

## 2020-11-09 DIAGNOSIS — M1711 Unilateral primary osteoarthritis, right knee: Secondary | ICD-10-CM | POA: Diagnosis not present

## 2020-11-09 DIAGNOSIS — Z79899 Other long term (current) drug therapy: Secondary | ICD-10-CM | POA: Diagnosis not present

## 2020-11-09 DIAGNOSIS — I10 Essential (primary) hypertension: Secondary | ICD-10-CM | POA: Diagnosis not present

## 2020-11-09 DIAGNOSIS — Z7984 Long term (current) use of oral hypoglycemic drugs: Secondary | ICD-10-CM | POA: Diagnosis not present

## 2020-11-09 DIAGNOSIS — E119 Type 2 diabetes mellitus without complications: Secondary | ICD-10-CM | POA: Diagnosis not present

## 2020-11-09 LAB — SARS CORONAVIRUS 2 (TAT 6-24 HRS): SARS Coronavirus 2: NEGATIVE

## 2020-11-09 LAB — GLUCOSE, CAPILLARY
Glucose-Capillary: 85 mg/dL (ref 70–99)
Glucose-Capillary: 115 mg/dL — ABNORMAL HIGH (ref 70–99)
Glucose-Capillary: 103 mg/dL — ABNORMAL HIGH (ref 70–99)
Glucose-Capillary: 123 mg/dL — ABNORMAL HIGH (ref 70–99)

## 2020-11-09 MED ORDER — DOCUSATE SODIUM 100 MG PO CAPS
100.0000 mg | ORAL_CAPSULE | Freq: Two times a day (BID) | ORAL | 0 refills | Status: DC
Start: 2020-11-09 — End: 2020-12-29

## 2020-11-09 MED ORDER — SENNA 8.6 MG PO TABS: 1.0000 | ORAL_TABLET | Freq: Two times a day (BID) | ORAL | 0 refills | Status: DC

## 2020-11-09 MED ORDER — OXYCODONE HCL 5 MG PO TABS: 5.0000 mg | ORAL_TABLET | ORAL | 0 refills | Status: AC | PRN

## 2020-11-09 MED ORDER — ONDANSETRON HCL 4 MG PO TABS: 4.0000 mg | ORAL_TABLET | Freq: Four times a day (QID) | ORAL | 0 refills | Status: DC | PRN

## 2020-11-09 MED ORDER — APIXABAN 2.5 MG PO TABS: 2.5000 mg | ORAL_TABLET | Freq: Two times a day (BID) | ORAL | 0 refills | Status: DC

## 2020-11-09 NOTE — Care Plan (Signed)
Update to DCP:  Patient would like to dc home instead of SNF. Phone call to her SW, [name] and left message on voicemail requesting a callback to discuss option of increasing personal care services and contacting patient's boyfriend/caregiver to come in for caregiver training.  Therapy with [facility] can be changed to home visits under mobile outpatient option.  Awaiting callback.

## 2020-11-09 NOTE — Telephone Encounter (Signed)
I did speak with Secretary/administrator She was able to get me on speaker phone with [name] agreed to go to [place] for rehab We will drop by in a couple days to say hello to her I did express that it is in the patient's extreme best interest to do this therapy and failure to do so could result in major health issues

## 2020-11-09 NOTE — Progress Notes (Signed)
    Subjective:  Patient reports pain as mild to moderate.  Denies N/V/CP/SOB. Patient upset that she may need short term stay in rehab  Objective:   VITALS:   Vitals:   11/08/20 0610 11/08/20 1444 11/08/20 2215 11/09/20 0606  BP: (!) 116/49 (!) 92/53 (!) 112/58 (!) 101/53  Pulse: 83 78 72 72  Resp: 16  16 16   Temp: (!) 97.5 F (36.4 C) 98.5 F (36.9 C) 98.2 F (36.8 C) 97.7 F (36.5 C)  TempSrc: Oral  Oral Oral  SpO2: 91% 90% 98% 93%  Weight:      Height:        NAD, awake and alert ABD soft Sensation intact distally Intact pulses distally Dorsiflexion/Plantar flexion intact Incision: dressing C/D/I Compartment soft Poor quad stength  Lab Results  Component Value Date   WBC 7.9 11/07/2020   HGB 8.9 (L) 11/07/2020   HCT 27.0 (L) 11/07/2020   MCV 92.5 11/07/2020   PLT 152 11/07/2020   BMET    Component Value Date/Time   NA 139 11/05/2020 0340   NA 137 07/16/2020 1111   K 4.0 11/05/2020 0340   CL 105 11/05/2020 0340   CO2 28 11/05/2020 0340   GLUCOSE 108 (H) 11/05/2020 0340   BUN <5 (L) 11/05/2020 0340   BUN 4 (L) 07/16/2020 1111   CREATININE 0.57 11/05/2020 0340   CREATININE 0.84 05/01/2013 1044   CALCIUM 8.2 (L) 11/05/2020 0340   GFRNONAA >60 11/05/2020 0340   GFRAA 92 07/16/2020 1111     Assessment/Plan: 5 Days Post-Op   Principal Problem:   Degenerative arthritis of right knee Active Problems:   Osteoarthritis of right knee   WBAT with walker DVT ppx: Apixaban, SCDs, TEDS PO pain control ABLA: Asymptomatic PT/OT: Still progressing very slowly Dispo: SNF placement unless she rapidly improves w/ PT.  Nurse case manager checking to see if insurance could provide more home health services   Dorothyann Peng 11/09/2020, 9:44 AM  [facility] is now [facility] [address], [address], [place], [place] [address] Phone: [phone] [email or web address] Facebook  [employer]

## 2020-11-09 NOTE — Plan of Care (Signed)
  Problem: Activity: Goal: Risk for activity intolerance will decrease Outcome: Progressing   Problem: Coping: Goal: Level of anxiety will decrease Outcome: Progressing   Problem: Skin Integrity: Goal: Risk for impaired skin integrity will decrease Outcome: Progressing   Problem: Clinical Measurements: Goal: Postoperative complications will be avoided or minimized Outcome: Progressing

## 2020-11-09 NOTE — Progress Notes (Signed)
Physical Therapy Treatment Patient Details Name: [name] MRN: [id number] DOB: [birth year] Today's Date: 11/09/2020    History of Present Illness Pt is 60 yo female s/p R TKA on 11/04/20.  Pt with PMH including mental retardation, DM, HTN, IBS, MI, sleep apnea, and arthritis of knees    PT Comments    Pt continues to exhibit myoclonic type movements of all 4 extremities. She admits to falls at home. The pt had a near fall yesterday requiring heavy assist of 2 to recover, as well as the bed being brought to pt d/t her knees suddenly buckling. Pt demonstrates poor insight into her current deficits, she has been on a bed/chair alarm the entire time she has been in this setting and often attempts to get up on her own despite her near fall(s) and being unsafe to mobilize without assist of 2 staff members. Pt endorses that her muscle "jerks" are currently "worse" than at home, unsure of the accuracy of this.  Continue to recommend SNF as she is unsafe with to mobilize with one person assist.     Follow Up Recommendations  Follow surgeon's recommendation for DC plan and follow-up therapies;SNF     Equipment Recommendations  Rolling walker with 5" wheels (if home may need w/c)    Recommendations for Other Services       Precautions / Restrictions Precautions Precautions: Fall;Knee Required Braces or Orthoses: Knee Immobilizer - Right Restrictions RLE Weight Bearing: Weight bearing as tolerated    Mobility  Bed Mobility               General bed mobility comments: deferred d/t pt wanting to drink her coffee    Transfers                    Ambulation/Gait                 Stairs             Wheelchair Mobility    Modified Rankin (Stroke Patients Only)       Balance                                            Cognition Arousal/Alertness: Awake/alert Behavior During Therapy: Impulsive Overall Cognitive Status:  Impaired/Different from baseline Area of Impairment: Attention;Memory;Following commands;Safety/judgement;Problem solving                   Current Attention Level: Sustained Memory: Decreased recall of precautions;Decreased short-term memory Following Commands: Follows one step commands inconsistently;Follows multi-step commands inconsistently Safety/Judgement: Decreased awareness of safety;Decreased awareness of deficits   Problem Solving: Difficulty sequencing;Requires verbal cues;Requires tactile cues General Comments: pt with hx of MR, no family or friends pleasant      Exercises Total Joint Exercises Ankle Circles/Pumps: AROM;Both;10 reps Quad Sets: AROM;Both;10 reps Short Arc Quad: Both;10 reps;AAROM;Limitations Short Arc Quad Limitations: poor muscle control Heel Slides: AAROM;Right;10 reps;Limitations Heel Slides Limitations: limited muscle control, sudden hip  ER at times Hip ABduction/ADduction: AAROM;Right;10 reps Straight Leg Raises: AAROM;Right;10 reps;Limitations Straight Leg Raises Limitations: poor motor control, intermittent uncontrolled movment    General Comments        Pertinent Vitals/Pain Pain Assessment: Faces Faces Pain Scale: Hurts a little bit Pain Location: R knee Pain Descriptors / Indicators: Discomfort;Grimacing Pain Intervention(s): Limited activity within patient's tolerance;Monitored during session;Premedicated before session  Home Living                      Prior Function            PT Goals (current goals can now be found in the care plan section) Acute Rehab PT Goals Patient Stated Goal: go home PT Goal Formulation: With patient Time For Goal Achievement: 11/18/20 Potential to Achieve Goals: Fair Progress towards PT goals: Progressing toward goals    Frequency    7X/week      PT Plan Current plan remains appropriate    Co-evaluation              AM-PAC PT "6 Clicks" Mobility   Outcome  Measure  Help needed turning from your back to your side while in a flat bed without using bedrails?: A Little Help needed moving from lying on your back to sitting on the side of a flat bed without using bedrails?: A Little Help needed moving to and from a bed to a chair (including a wheelchair)?: A Lot Help needed standing up from a chair using your arms (e.g., wheelchair or bedside chair)?: A Lot Help needed to walk in hospital room?: Total Help needed climbing 3-5 steps with a railing? : Total 6 Click Score: 12    End of Session   Activity Tolerance: Patient tolerated treatment well Patient left: with call bell/phone within reach;in bed;with bed alarm set   PT Visit Diagnosis: Other abnormalities of gait and mobility (R26.89);Muscle weakness (generalized) (M62.81)     Time: 2023-3435 PT Time Calculation (min) (ACUTE ONLY): 10 min  Charges:  $Therapeutic Exercise: 8-22 mins                     Baxter Flattery, PT  Acute Rehab Dept ([place]) [phone] Pager [phone]  11/09/2020    [facility] 11/09/2020, 1:04 PM

## 2020-11-09 NOTE — Plan of Care (Signed)
Plan of care discussed with pt.

## 2020-11-09 NOTE — Care Management Obs Status (Signed)
[place] NOTIFICATION   Patient Details  Name: [name] MRN: [id number] Date of Birth: 11-14-1960   Medicare Observation Status Notification Given:  Yes    Sherie Don, LCSW 11/09/2020, 1:57 PM

## 2020-11-09 NOTE — TOC Progression Note (Addendum)
Transition of Care [facility]) - Progression Note   Patient Details  Name: [name] MRN: [id number] Date of Birth: 1960/09/03  Transition of Care [facility]) CM/SW [place], LCSW Phone Number: 11/09/2020, 1:16 PM  Clinical Narrative: CSW received notification from [place] that patient has been approved for SNF at [facility] in [place] with a start date of 11/09/20 and the next review date is 11/11/20. Authorization # is: [id number]. PASRR has also been received: [id number]. Patient is now refusing SNF and wants to discharge home. CSW confirmed with patient's DSS worker, [name], that patient has capacity and cannot be compelled to go to SNF. CSW reached out to patient's PCP's office and requested that staff follow up with the patient to see if patient is willing to accept PT's recommendations after hearing from the PCP's office as she has rapport with the physician and staff.  CSW met with patient to discuss her concerns and PT's recommendations for SNF. Patient is adamant about discharging home. CSW explained that patient's leg weakness caused her legs to buckle yesterday while getting a two-person assist from PT and the RN. Patient stated, "I didn't mean to." CSW explained that discharging home is not the safest option for her rehab, which is why SNF was recommended, and she may continue to experience unintentional falls at home that could result in injury. Patient seemed to understand that she is at risk for possible injury if she goes home. CSW asked about 24/7 supervision for safety if the patient discharges home. Patient reported she has two friends that can stay with her throughout the day/night, but was only able to provide of their names, [name].  CSW spoke with [name] who assists with the ortho bundles. Per [name], patient can be set up with [facility] for OPPT, which will come to the patient's home rather than requiring her to go to a clinic. Patient expected to stay  overnight. RN updated.  Addendum: CSW received call from patient's PCP, [name], and he spoke with CSW and patient on speaker phone. [name] explained to patient why SNF was being recommended and safety concerns about going home without going to rehab first. Patient is now agreeable to SNF. CSW updated [name] with [name] and [name] with ortho.  Expected Discharge Plan: [place] Barriers to Discharge: Continued Medical Work up  Expected Discharge Plan and Services Expected Discharge Plan: [place] In-house Referral: Clinical Social Work Post Acute Care Choice: [place] Living arrangements for the past 2 months: [place] Expected Discharge Date: 11/05/20               DME Arranged: N/A DME Agency: NA HH Arranged: NA [place] Agency: NA  Readmission Risk Interventions No flowsheet data found.

## 2020-11-09 NOTE — Plan of Care (Signed)
Plan of care reviewed. 

## 2020-11-10 ENCOUNTER — Telehealth: Payer: Self-pay | Admitting: Family Medicine

## 2020-11-10 DIAGNOSIS — R5381 Other malaise: Secondary | ICD-10-CM | POA: Diagnosis not present

## 2020-11-10 DIAGNOSIS — Z20822 Contact with and (suspected) exposure to covid-19: Secondary | ICD-10-CM | POA: Diagnosis not present

## 2020-11-10 DIAGNOSIS — Z79899 Other long term (current) drug therapy: Secondary | ICD-10-CM | POA: Diagnosis not present

## 2020-11-10 DIAGNOSIS — Z743 Need for continuous supervision: Secondary | ICD-10-CM | POA: Diagnosis not present

## 2020-11-10 DIAGNOSIS — Z471 Aftercare following joint replacement surgery: Secondary | ICD-10-CM | POA: Diagnosis not present

## 2020-11-10 DIAGNOSIS — E785 Hyperlipidemia, unspecified: Secondary | ICD-10-CM | POA: Diagnosis not present

## 2020-11-10 DIAGNOSIS — Z7401 Bed confinement status: Secondary | ICD-10-CM | POA: Diagnosis not present

## 2020-11-10 DIAGNOSIS — M170 Bilateral primary osteoarthritis of knee: Secondary | ICD-10-CM | POA: Diagnosis not present

## 2020-11-10 DIAGNOSIS — E119 Type 2 diabetes mellitus without complications: Secondary | ICD-10-CM | POA: Diagnosis not present

## 2020-11-10 DIAGNOSIS — R2689 Other abnormalities of gait and mobility: Secondary | ICD-10-CM | POA: Diagnosis not present

## 2020-11-10 DIAGNOSIS — K5900 Constipation, unspecified: Secondary | ICD-10-CM | POA: Diagnosis not present

## 2020-11-10 DIAGNOSIS — F17210 Nicotine dependence, cigarettes, uncomplicated: Secondary | ICD-10-CM | POA: Diagnosis not present

## 2020-11-10 DIAGNOSIS — R279 Unspecified lack of coordination: Secondary | ICD-10-CM | POA: Diagnosis not present

## 2020-11-10 DIAGNOSIS — M2550 Pain in unspecified joint: Secondary | ICD-10-CM | POA: Diagnosis not present

## 2020-11-10 DIAGNOSIS — R52 Pain, unspecified: Secondary | ICD-10-CM | POA: Diagnosis not present

## 2020-11-10 DIAGNOSIS — R296 Repeated falls: Secondary | ICD-10-CM | POA: Diagnosis not present

## 2020-11-10 DIAGNOSIS — R1310 Dysphagia, unspecified: Secondary | ICD-10-CM | POA: Diagnosis not present

## 2020-11-10 DIAGNOSIS — M6281 Muscle weakness (generalized): Secondary | ICD-10-CM | POA: Diagnosis not present

## 2020-11-10 DIAGNOSIS — Z7984 Long term (current) use of oral hypoglycemic drugs: Secondary | ICD-10-CM | POA: Diagnosis not present

## 2020-11-10 DIAGNOSIS — Z96651 Presence of right artificial knee joint: Secondary | ICD-10-CM | POA: Diagnosis not present

## 2020-11-10 DIAGNOSIS — G20 Parkinson's disease: Secondary | ICD-10-CM | POA: Diagnosis not present

## 2020-11-10 DIAGNOSIS — I10 Essential (primary) hypertension: Secondary | ICD-10-CM | POA: Diagnosis not present

## 2020-11-10 DIAGNOSIS — M1711 Unilateral primary osteoarthritis, right knee: Secondary | ICD-10-CM | POA: Diagnosis not present

## 2020-11-10 LAB — GLUCOSE, CAPILLARY
Glucose-Capillary: 100 mg/dL — ABNORMAL HIGH (ref 70–99)
Glucose-Capillary: 92 mg/dL (ref 70–99)

## 2020-11-10 NOTE — TOC Transition Note (Signed)
Transition of Care [facility]) - CM/SW Discharge Note  Patient Details  Name: [name] MRN: [id number] Date of Birth: [birth year]  Transition of Care [facility]) CM/SW Contact:  Sherie Don, LCSW Phone Number: 11/10/2020, 11:40 AM   Clinical Narrative: Patient still agreeable to going to SNF. CSW spoke with Jackelyn Poling at [facility] to confirm patient can come today. Patient will go to room A20 bed 2 and the number for report is [phone]. Discharge summary, discharge orders, and SNF transfer report faxed to [facility] in hub. Medical necessity form done; PTAR scheduled. Discharge packet complete. RN updated. CSW also notified patient's PCP's office and DSS caseworker, Foye Spurling, of discharge to SNF. TOC signing off.  Final next level of care: [place] Barriers to Discharge: Barriers Resolved  Patient Goals and CMS Choice Patient states their goals for this hospitalization and ongoing recovery are:: Discharge to [facility] for SNF and then home after getting stronger CMS Medicare.gov Compare Post Acute Care list provided to:: Patient Represenative (must comment) Choice offered to / list presented to : Patient Foye Spurling (DSS caseworker))  Discharge Plan and Services In-house Referral: Clinical Social Work Post Acute Care Choice: [place]          DME Arranged: N/A DME Agency: NA HH Arranged: NA [place] Agency: NA  Readmission Risk Interventions No flowsheet data found.

## 2020-11-10 NOTE — Telephone Encounter (Signed)
Jinny Blossom, Social Worker from [employer], calling to let Dr.Scott know that patient did agree to go to [facility]. Pt will be going today. Please advise. Thank you

## 2020-11-11 ENCOUNTER — Telehealth: Payer: Self-pay

## 2020-11-11 DIAGNOSIS — R52 Pain, unspecified: Secondary | ICD-10-CM | POA: Diagnosis not present

## 2020-11-11 DIAGNOSIS — Z79899 Other long term (current) drug therapy: Secondary | ICD-10-CM | POA: Diagnosis not present

## 2020-11-11 DIAGNOSIS — I10 Essential (primary) hypertension: Secondary | ICD-10-CM | POA: Diagnosis not present

## 2020-11-11 DIAGNOSIS — E785 Hyperlipidemia, unspecified: Secondary | ICD-10-CM | POA: Diagnosis not present

## 2020-11-11 DIAGNOSIS — G20 Parkinson's disease: Secondary | ICD-10-CM | POA: Diagnosis not present

## 2020-11-11 DIAGNOSIS — K5900 Constipation, unspecified: Secondary | ICD-10-CM | POA: Diagnosis not present

## 2020-11-11 DIAGNOSIS — Z96651 Presence of right artificial knee joint: Secondary | ICD-10-CM | POA: Diagnosis not present

## 2020-11-11 NOTE — Telephone Encounter (Signed)
-----   Message from Garrel Ridgel, [place] sent at 11/01/2020  7:04 AM EDT ----- Very mild peripheral vascular disease.

## 2020-11-11 NOTE — Telephone Encounter (Signed)
Patient's social worker [name] notified of vascular results

## 2020-11-16 ENCOUNTER — Ambulatory Visit: Payer: Medicaid Other | Admitting: Gastroenterology

## 2020-11-16 DIAGNOSIS — M1711 Unilateral primary osteoarthritis, right knee: Secondary | ICD-10-CM | POA: Diagnosis not present

## 2020-11-16 DIAGNOSIS — R5381 Other malaise: Secondary | ICD-10-CM | POA: Diagnosis not present

## 2020-11-19 ENCOUNTER — Ambulatory Visit: Payer: Medicaid Other | Admitting: Gastroenterology

## 2020-11-22 DIAGNOSIS — Z96651 Presence of right artificial knee joint: Secondary | ICD-10-CM | POA: Diagnosis not present

## 2020-11-22 DIAGNOSIS — Z471 Aftercare following joint replacement surgery: Secondary | ICD-10-CM | POA: Diagnosis not present

## 2020-11-24 ENCOUNTER — Other Ambulatory Visit (HOSPITAL_COMMUNITY): Payer: Self-pay | Admitting: Psychiatry

## 2020-11-29 ENCOUNTER — Telehealth (HOSPITAL_COMMUNITY): Payer: Medicare Other | Admitting: Psychiatry

## 2020-11-29 ENCOUNTER — Other Ambulatory Visit: Payer: Self-pay

## 2020-11-29 DIAGNOSIS — R5381 Other malaise: Secondary | ICD-10-CM | POA: Diagnosis not present

## 2020-11-29 DIAGNOSIS — I10 Essential (primary) hypertension: Secondary | ICD-10-CM | POA: Diagnosis not present

## 2020-11-29 DIAGNOSIS — E785 Hyperlipidemia, unspecified: Secondary | ICD-10-CM | POA: Diagnosis not present

## 2020-11-29 DIAGNOSIS — G20 Parkinson's disease: Secondary | ICD-10-CM | POA: Diagnosis not present

## 2020-11-29 DIAGNOSIS — Z96651 Presence of right artificial knee joint: Secondary | ICD-10-CM | POA: Diagnosis not present

## 2020-11-29 DIAGNOSIS — K5900 Constipation, unspecified: Secondary | ICD-10-CM | POA: Diagnosis not present

## 2020-11-29 DIAGNOSIS — M1711 Unilateral primary osteoarthritis, right knee: Secondary | ICD-10-CM | POA: Diagnosis not present

## 2020-12-01 ENCOUNTER — Other Ambulatory Visit: Payer: Self-pay

## 2020-12-01 ENCOUNTER — Ambulatory Visit (INDEPENDENT_AMBULATORY_CARE_PROVIDER_SITE_OTHER): Payer: Medicare Other | Admitting: Family Medicine

## 2020-12-01 ENCOUNTER — Encounter: Payer: Self-pay | Admitting: Family Medicine

## 2020-12-01 VITALS — BP 128/82 | HR 127 | Temp 96.7°F | Wt 202.2 lb

## 2020-12-01 DIAGNOSIS — N289 Disorder of kidney and ureter, unspecified: Secondary | ICD-10-CM | POA: Diagnosis not present

## 2020-12-01 DIAGNOSIS — D500 Iron deficiency anemia secondary to blood loss (chronic): Secondary | ICD-10-CM | POA: Diagnosis not present

## 2020-12-01 NOTE — Progress Notes (Signed)
   Subjective:    Patient ID: [name], female    DOB: 01-31-1961, 60 y.o.   MRN: [id number]  HPI Pt here for follow up on blood pressure. Pt recently released from [facility]; had knee surgery and went to [facility] for rehab.  Pt social worker will bring list of current meds.  Patient recently was released from the rehab center She will need some home physical therapy She is homebound She is currently walking with a walker Her knee is feeling good she denies any severe pain She has lost weight partly from the fact that she is not eating as much The food at the rehab center was not quite what she is used to eating She is used to eating fast food Review of Systems     Objective:   Physical Exam  Lungs are clear heart regular pulse normal BP good her surgical knee looks good no sign of infection no swelling in the calf no tenderness      Assessment & Plan:  Status post knee replacement Her social worker states that she already has a physical therapist is going to work with her from home they will let us know if they need any type of orders or oversight  1. Anemia, blood loss Recheck CBC this was related to blood loss from surgery - CBC with Differential - Basic Metabolic Panel (BMET)  2. Renal insufficiency Recheck kidney functions - CBC with Differential - Basic Metabolic Panel (BMET)  Weight loss due to dietary healthier choices and activity hopefully she can keep this up  She is now returning to her apartment where unfortunately she does not always stick with healthy habits  The social worker will get [place] an up-to-date med list that she was sent home on  Follow-up in 3 months

## 2020-12-02 ENCOUNTER — Encounter: Payer: Self-pay | Admitting: Family Medicine

## 2020-12-02 ENCOUNTER — Telehealth (INDEPENDENT_AMBULATORY_CARE_PROVIDER_SITE_OTHER): Payer: Medicare Other | Admitting: Psychiatry

## 2020-12-02 ENCOUNTER — Encounter (HOSPITAL_COMMUNITY): Payer: Self-pay | Admitting: Psychiatry

## 2020-12-02 DIAGNOSIS — F251 Schizoaffective disorder, depressive type: Secondary | ICD-10-CM | POA: Diagnosis not present

## 2020-12-02 LAB — BASIC METABOLIC PANEL
BUN/Creatinine Ratio: 5 — ABNORMAL LOW (ref 9–23)
BUN: 3 mg/dL — ABNORMAL LOW (ref 6–24)
CO2: 28 mmol/L (ref 20–29)
Calcium: 9.1 mg/dL (ref 8.7–10.2)
Chloride: 97 mmol/L (ref 96–106)
Creatinine, Ser: 0.62 mg/dL (ref 0.57–1.00)
Glucose: 109 mg/dL — ABNORMAL HIGH (ref 65–99)
Potassium: 3.7 mmol/L (ref 3.5–5.2)
Sodium: 139 mmol/L (ref 134–144)
eGFR: 103 mL/min/{1.73_m2} (ref >59)

## 2020-12-02 LAB — CBC WITH DIFFERENTIAL/PLATELET
Basophils Absolute: 0 10*3/uL (ref 0.0–0.2)
Basos: 1 %
EOS (ABSOLUTE): 0.1 10*3/uL (ref 0.0–0.4)
Eos: 1 %
Hematocrit: 30.9 % — ABNORMAL LOW (ref 34.0–46.6)
Hemoglobin: 10.2 g/dL — ABNORMAL LOW (ref 11.1–15.9)
Immature Grans (Abs): 0 10*3/uL (ref 0.0–0.1)
Immature Granulocytes: 0 %
Lymphocytes Absolute: 1.6 10*3/uL (ref 0.7–3.1)
Lymphs: 35 %
MCH: 29.4 pg (ref 26.6–33.0)
MCHC: 33 g/dL (ref 31.5–35.7)
MCV: 89 fL (ref 79–97)
Monocytes Absolute: 0.5 10*3/uL (ref 0.1–0.9)
Monocytes: 10 %
Neutrophils Absolute: 2.4 10*3/uL (ref 1.4–7.0)
Neutrophils: 53 %
Platelets: 273 10*3/uL (ref 150–450)
RBC: 3.47 x10E6/uL — ABNORMAL LOW (ref 3.77–5.28)
RDW: 13.2 % (ref 11.7–15.4)
WBC: 4.5 10*3/uL (ref 3.4–10.8)

## 2020-12-02 MED ORDER — SERTRALINE HCL 100 MG PO TABS: 1.0000 | ORAL_TABLET | Freq: Every day | ORAL | 2 refills | Status: DC

## 2020-12-02 MED ORDER — TRAZODONE HCL 100 MG PO TABS: ORAL_TABLET | ORAL | 2 refills | Status: DC

## 2020-12-02 MED ORDER — PALIPERIDONE ER 9 MG PO TB24: 9.0000 mg | ORAL_TABLET | Freq: Every day | ORAL | 2 refills | Status: DC

## 2020-12-02 MED ORDER — BENZTROPINE MESYLATE 1 MG PO TABS: 1.0000 mg | ORAL_TABLET | Freq: Every day | ORAL | 2 refills | Status: DC

## 2020-12-02 MED ORDER — LAMOTRIGINE 100 MG PO TABS: 100.0000 mg | ORAL_TABLET | Freq: Two times a day (BID) | ORAL | 2 refills | Status: DC
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
## 2020-12-07 DIAGNOSIS — M6281 Muscle weakness (generalized): Secondary | ICD-10-CM | POA: Diagnosis not present

## 2020-12-07 DIAGNOSIS — M1711 Unilateral primary osteoarthritis, right knee: Secondary | ICD-10-CM | POA: Diagnosis not present

## 2020-12-07 DIAGNOSIS — R262 Difficulty in walking, not elsewhere classified: Secondary | ICD-10-CM | POA: Diagnosis not present

## 2020-12-07 DIAGNOSIS — M25561 Pain in right knee: Secondary | ICD-10-CM | POA: Diagnosis not present

## 2020-12-07 DIAGNOSIS — Z471 Aftercare following joint replacement surgery: Secondary | ICD-10-CM | POA: Diagnosis not present

## 2020-12-09 DIAGNOSIS — Z471 Aftercare following joint replacement surgery: Secondary | ICD-10-CM | POA: Diagnosis not present

## 2020-12-09 DIAGNOSIS — M1711 Unilateral primary osteoarthritis, right knee: Secondary | ICD-10-CM | POA: Diagnosis not present

## 2020-12-09 DIAGNOSIS — M6281 Muscle weakness (generalized): Secondary | ICD-10-CM | POA: Diagnosis not present

## 2020-12-09 DIAGNOSIS — M25561 Pain in right knee: Secondary | ICD-10-CM | POA: Diagnosis not present

## 2020-12-09 DIAGNOSIS — R262 Difficulty in walking, not elsewhere classified: Secondary | ICD-10-CM | POA: Diagnosis not present

## 2020-12-10 ENCOUNTER — Ambulatory Visit: Payer: Medicare Other | Admitting: Orthopedic Surgery

## 2020-12-13 ENCOUNTER — Ambulatory Visit: Payer: Medicare Other | Admitting: Orthopedic Surgery

## 2020-12-13 DIAGNOSIS — M25561 Pain in right knee: Secondary | ICD-10-CM | POA: Diagnosis not present

## 2020-12-13 DIAGNOSIS — M1711 Unilateral primary osteoarthritis, right knee: Secondary | ICD-10-CM | POA: Diagnosis not present

## 2020-12-13 DIAGNOSIS — R262 Difficulty in walking, not elsewhere classified: Secondary | ICD-10-CM | POA: Diagnosis not present

## 2020-12-13 DIAGNOSIS — Z471 Aftercare following joint replacement surgery: Secondary | ICD-10-CM | POA: Diagnosis not present

## 2020-12-13 DIAGNOSIS — M6281 Muscle weakness (generalized): Secondary | ICD-10-CM | POA: Diagnosis not present

## 2020-12-15 ENCOUNTER — Other Ambulatory Visit: Payer: Self-pay | Admitting: Thoracic Surgery (Cardiothoracic Vascular Surgery)

## 2020-12-15 ENCOUNTER — Telehealth: Payer: Self-pay | Admitting: *Deleted

## 2020-12-15 DIAGNOSIS — R911 Solitary pulmonary nodule: Secondary | ICD-10-CM

## 2020-12-15 NOTE — Telephone Encounter (Signed)
Social worker, [name] came by.  She informed us that pt started having diarrhea when she was at [place] 2 weeks ago.  According to [place], pt didn't tell them about it.  She was there for therapy.  She is continuing to have diarrhea and messing up her clothes when she is out and about.  She is back home now and not at [place].  Wants to know if we can recommend something to help.    Called pt to see if she has been taking her bentyl.  Unable to reach pt.  Social worker wants to know if we can give her some samples of something to help her.

## 2020-12-15 NOTE — Telephone Encounter (Signed)
We need to check Cdiff and GI pathogen panel before giving any meds.

## 2020-12-16 NOTE — Telephone Encounter (Signed)
Spoke to Education officer, museum, Engineer, civil (consulting).  Informed her that we need to check Cdiff and GI pathogen panel before giving any meds to pt.  She said that she will discuss this with pt and call us back.  Informed her that we will also need to know which lab she would like to go to.

## 2020-12-19 DIAGNOSIS — R296 Repeated falls: Secondary | ICD-10-CM | POA: Diagnosis not present

## 2020-12-19 DIAGNOSIS — M170 Bilateral primary osteoarthritis of knee: Secondary | ICD-10-CM | POA: Diagnosis not present

## 2020-12-21 ENCOUNTER — Other Ambulatory Visit (HOSPITAL_COMMUNITY): Payer: Self-pay | Admitting: Family Medicine

## 2020-12-21 ENCOUNTER — Other Ambulatory Visit: Payer: Self-pay | Admitting: Family Medicine

## 2020-12-21 DIAGNOSIS — Z1231 Encounter for screening mammogram for malignant neoplasm of breast: Secondary | ICD-10-CM

## 2020-12-22 ENCOUNTER — Other Ambulatory Visit: Payer: Self-pay | Admitting: Family Medicine

## 2020-12-22 DIAGNOSIS — M6281 Muscle weakness (generalized): Secondary | ICD-10-CM | POA: Diagnosis not present

## 2020-12-22 DIAGNOSIS — M1711 Unilateral primary osteoarthritis, right knee: Secondary | ICD-10-CM | POA: Diagnosis not present

## 2020-12-22 DIAGNOSIS — R262 Difficulty in walking, not elsewhere classified: Secondary | ICD-10-CM | POA: Diagnosis not present

## 2020-12-22 DIAGNOSIS — Z471 Aftercare following joint replacement surgery: Secondary | ICD-10-CM | POA: Diagnosis not present

## 2020-12-22 DIAGNOSIS — M25561 Pain in right knee: Secondary | ICD-10-CM | POA: Diagnosis not present

## 2020-12-22 NOTE — Telephone Encounter (Signed)
6 rf each

## 2020-12-23 ENCOUNTER — Other Ambulatory Visit: Payer: Self-pay | Admitting: *Deleted

## 2020-12-24 DIAGNOSIS — Z471 Aftercare following joint replacement surgery: Secondary | ICD-10-CM | POA: Diagnosis not present

## 2020-12-24 DIAGNOSIS — Z96651 Presence of right artificial knee joint: Secondary | ICD-10-CM | POA: Diagnosis not present

## 2020-12-27 DIAGNOSIS — M25561 Pain in right knee: Secondary | ICD-10-CM | POA: Diagnosis not present

## 2020-12-27 DIAGNOSIS — Z471 Aftercare following joint replacement surgery: Secondary | ICD-10-CM | POA: Diagnosis not present

## 2020-12-27 DIAGNOSIS — M1711 Unilateral primary osteoarthritis, right knee: Secondary | ICD-10-CM | POA: Diagnosis not present

## 2020-12-27 DIAGNOSIS — R262 Difficulty in walking, not elsewhere classified: Secondary | ICD-10-CM | POA: Diagnosis not present

## 2020-12-27 DIAGNOSIS — M6281 Muscle weakness (generalized): Secondary | ICD-10-CM | POA: Diagnosis not present

## 2020-12-28 NOTE — Telephone Encounter (Signed)
Have pharmacy stop Eliquis.  May have 6 refills of stool softener.  Please remove the Eliquis from her medication list.

## 2020-12-29 ENCOUNTER — Other Ambulatory Visit: Payer: Self-pay | Admitting: *Deleted

## 2020-12-29 DIAGNOSIS — M1711 Unilateral primary osteoarthritis, right knee: Secondary | ICD-10-CM | POA: Diagnosis not present

## 2020-12-29 DIAGNOSIS — M6281 Muscle weakness (generalized): Secondary | ICD-10-CM | POA: Diagnosis not present

## 2020-12-29 DIAGNOSIS — M25561 Pain in right knee: Secondary | ICD-10-CM | POA: Diagnosis not present

## 2020-12-29 DIAGNOSIS — R262 Difficulty in walking, not elsewhere classified: Secondary | ICD-10-CM | POA: Diagnosis not present

## 2020-12-29 DIAGNOSIS — Z471 Aftercare following joint replacement surgery: Secondary | ICD-10-CM | POA: Diagnosis not present

## 2021-01-04 DIAGNOSIS — M25561 Pain in right knee: Secondary | ICD-10-CM | POA: Diagnosis not present

## 2021-01-04 DIAGNOSIS — Z471 Aftercare following joint replacement surgery: Secondary | ICD-10-CM | POA: Diagnosis not present

## 2021-01-04 DIAGNOSIS — M6281 Muscle weakness (generalized): Secondary | ICD-10-CM | POA: Diagnosis not present

## 2021-01-04 DIAGNOSIS — R262 Difficulty in walking, not elsewhere classified: Secondary | ICD-10-CM | POA: Diagnosis not present

## 2021-01-04 DIAGNOSIS — M1711 Unilateral primary osteoarthritis, right knee: Secondary | ICD-10-CM | POA: Diagnosis not present

## 2021-01-06 DIAGNOSIS — M25561 Pain in right knee: Secondary | ICD-10-CM | POA: Diagnosis not present

## 2021-01-06 DIAGNOSIS — M1711 Unilateral primary osteoarthritis, right knee: Secondary | ICD-10-CM | POA: Diagnosis not present

## 2021-01-06 DIAGNOSIS — Z471 Aftercare following joint replacement surgery: Secondary | ICD-10-CM | POA: Diagnosis not present

## 2021-01-06 DIAGNOSIS — M6281 Muscle weakness (generalized): Secondary | ICD-10-CM | POA: Diagnosis not present

## 2021-01-06 DIAGNOSIS — R262 Difficulty in walking, not elsewhere classified: Secondary | ICD-10-CM | POA: Diagnosis not present

## 2021-01-11 DIAGNOSIS — Z471 Aftercare following joint replacement surgery: Secondary | ICD-10-CM | POA: Diagnosis not present

## 2021-01-11 DIAGNOSIS — R262 Difficulty in walking, not elsewhere classified: Secondary | ICD-10-CM | POA: Diagnosis not present

## 2021-01-11 DIAGNOSIS — M6281 Muscle weakness (generalized): Secondary | ICD-10-CM | POA: Diagnosis not present

## 2021-01-11 DIAGNOSIS — M25561 Pain in right knee: Secondary | ICD-10-CM | POA: Diagnosis not present

## 2021-01-11 DIAGNOSIS — M1711 Unilateral primary osteoarthritis, right knee: Secondary | ICD-10-CM | POA: Diagnosis not present

## 2021-01-13 DIAGNOSIS — R262 Difficulty in walking, not elsewhere classified: Secondary | ICD-10-CM | POA: Diagnosis not present

## 2021-01-13 DIAGNOSIS — M1711 Unilateral primary osteoarthritis, right knee: Secondary | ICD-10-CM | POA: Diagnosis not present

## 2021-01-13 DIAGNOSIS — M6281 Muscle weakness (generalized): Secondary | ICD-10-CM | POA: Diagnosis not present

## 2021-01-13 DIAGNOSIS — Z471 Aftercare following joint replacement surgery: Secondary | ICD-10-CM | POA: Diagnosis not present

## 2021-01-13 DIAGNOSIS — M25561 Pain in right knee: Secondary | ICD-10-CM | POA: Diagnosis not present

## 2021-01-18 DIAGNOSIS — R296 Repeated falls: Secondary | ICD-10-CM | POA: Diagnosis not present

## 2021-01-18 DIAGNOSIS — M170 Bilateral primary osteoarthritis of knee: Secondary | ICD-10-CM | POA: Diagnosis not present

## 2021-01-20 ENCOUNTER — Other Ambulatory Visit: Payer: Self-pay | Admitting: Family Medicine

## 2021-01-27 ENCOUNTER — Ambulatory Visit (HOSPITAL_COMMUNITY)
Admission: RE | Admit: 2021-01-27 | Discharge: 2021-01-27 | Disposition: A | Discharge status: Home / Self Care | Payer: Medicare Other | Source: Ambulatory Visit | Attending: Family Medicine | Admitting: Family Medicine

## 2021-01-27 ENCOUNTER — Other Ambulatory Visit (HOSPITAL_COMMUNITY): Payer: Self-pay | Admitting: Obstetrics & Gynecology

## 2021-01-27 ENCOUNTER — Other Ambulatory Visit: Payer: Self-pay

## 2021-01-27 ENCOUNTER — Other Ambulatory Visit (HOSPITAL_COMMUNITY): Payer: Self-pay | Admitting: Nurse Practitioner

## 2021-01-27 DIAGNOSIS — Z1231 Encounter for screening mammogram for malignant neoplasm of breast: Secondary | ICD-10-CM | POA: Insufficient documentation

## 2021-01-28 ENCOUNTER — Ambulatory Visit: Payer: Medicare Other | Admitting: Podiatry

## 2021-02-01 ENCOUNTER — Telehealth: Payer: Self-pay | Admitting: Family Medicine

## 2021-02-01 NOTE — Telephone Encounter (Signed)
Patient is needing a prescription for adult diapers and something for diarrhea. [facility]

## 2021-02-02 ENCOUNTER — Telehealth: Payer: Self-pay | Admitting: Internal Medicine

## 2021-02-02 NOTE — Telephone Encounter (Signed)
She can take Bentyl up to 4 times per day. Really needs to do stool studies. Unable to provide other recommendations if diarrhea is worsening. Agree with Imodium prn per PCP.

## 2021-02-02 NOTE — Telephone Encounter (Signed)
May use Imodium tablets 1 tablet taken 4 times daily as needed for the diarrhea, #20 May also have prescription for adult diapers 6/day for the next 5 days

## 2021-02-02 NOTE — Telephone Encounter (Signed)
[name](DPR) notified

## 2021-02-02 NOTE — Telephone Encounter (Signed)
Pt's caseworker, [name], called to say that patient has been having diarrhea x 2 weeks and was there anything we could call in or recommend for her. Please call [name] at [phone]

## 2021-02-02 NOTE — Telephone Encounter (Signed)
Prescriptions called in to pharmacist at [facility]

## 2021-02-02 NOTE — Telephone Encounter (Signed)
Lmom for pt to call me back. 

## 2021-02-02 NOTE — Telephone Encounter (Addendum)
Patient with diarrhea for 2 days -has put in a message with gastro this am but wonders if Dr Nicki Reaper can sent in script for adult diapers and imodium PRN to [facility]

## 2021-02-02 NOTE — Telephone Encounter (Signed)
Spoke with [name] (case worker).  She informed me that pt mentioned having diarrhea 2 days ago.  I asked her if pt had been having diarrhea all this time since last time we spoke in June.  She was unsure.  Said pt only mentioned it again 2 days ago.  [name] said that she still had nuns hat and brown bag where we had requested her to do stool sample collection some time ago.  [name] said that pt refuses to do any stool collections because she thinks it makes her feel dirty.  Pt with watery diarrhea all day long. Called Dr. Lance Sell office to see if they could get some imodium and adult diapers ordered.  Wants to know if she can take anything to help with diarrhea without stool collection.

## 2021-02-03 ENCOUNTER — Telehealth: Payer: Self-pay | Admitting: Internal Medicine

## 2021-02-03 NOTE — Telephone Encounter (Signed)
See other phone note

## 2021-02-03 NOTE — Telephone Encounter (Signed)
[name] Architectural technologist) returning call.

## 2021-02-03 NOTE — Telephone Encounter (Signed)
Spoke to [employer] (case worker listed on [place]).  She was informed to let pt know she can take Bentyl up to 4 times per day. Informed her that pt really needs to do stool studies.  She was informed that we are unable to provide other recommendations if diarrhea is worsening. She was informed that Roseanne Kaufman, NP agrees with Imodium prn per PCP.   She voiced understanding and will inform pt.

## 2021-02-03 NOTE — Telephone Encounter (Signed)
Lmom for pt to call me back. 

## 2021-02-04 ENCOUNTER — Ambulatory Visit: Payer: Medicare Other | Admitting: Thoracic Surgery (Cardiothoracic Vascular Surgery)

## 2021-02-04 ENCOUNTER — Inpatient Hospital Stay: Admission: RE | Admit: 2021-02-04 | Payer: Medicare Other | Source: Ambulatory Visit

## 2021-02-07 ENCOUNTER — Ambulatory Visit: Payer: Medicare Other | Admitting: Podiatry

## 2021-02-15 ENCOUNTER — Other Ambulatory Visit (HOSPITAL_COMMUNITY): Payer: Self-pay | Admitting: Psychiatry

## 2021-02-15 MED ORDER — PALIPERIDONE ER 9 MG PO TB24: 9.0000 mg | ORAL_TABLET | Freq: Every day | ORAL | 2 refills | Status: DC

## 2021-02-15 MED ORDER — LAMOTRIGINE 100 MG PO TABS: 100.0000 mg | ORAL_TABLET | Freq: Two times a day (BID) | ORAL | 2 refills | Status: DC

## 2021-02-15 MED ORDER — BENZTROPINE MESYLATE 1 MG PO TABS: 1.0000 mg | ORAL_TABLET | Freq: Every day | ORAL | 2 refills | Status: DC

## 2021-02-16 ENCOUNTER — Other Ambulatory Visit: Payer: Self-pay

## 2021-02-17 MED ORDER — DICYCLOMINE HCL 10 MG PO CAPS: 10.0000 mg | ORAL_CAPSULE | Freq: Three times a day (TID) | ORAL | 5 refills | Status: DC

## 2021-02-17 MED ORDER — RABEPRAZOLE SODIUM 20 MG PO TBEC: 20.0000 mg | DELAYED_RELEASE_TABLET | Freq: Two times a day (BID) | ORAL | 3 refills | Status: DC

## 2021-02-22 ENCOUNTER — Other Ambulatory Visit: Payer: Self-pay | Admitting: Family Medicine

## 2021-02-22 ENCOUNTER — Other Ambulatory Visit: Payer: Self-pay

## 2021-02-22 ENCOUNTER — Telehealth (INDEPENDENT_AMBULATORY_CARE_PROVIDER_SITE_OTHER): Payer: Self-pay | Admitting: Psychiatry

## 2021-02-22 DIAGNOSIS — Z5329 Procedure and treatment not carried out because of patient's decision for other reasons: Secondary | ICD-10-CM

## 2021-02-22 MED ORDER — SERTRALINE HCL 100 MG PO TABS: 100.0000 mg | ORAL_TABLET | Freq: Every day | ORAL | 2 refills | Status: DC

## 2021-02-22 MED ORDER — TRAZODONE HCL 100 MG PO TABS: ORAL_TABLET | ORAL | 2 refills | Status: DC

## 2021-02-24 MED ORDER — PROPRANOLOL HCL 10 MG PO TABS: 10.0000 mg | ORAL_TABLET | Freq: Three times a day (TID) | ORAL | 0 refills | Status: DC

## 2021-02-25 ENCOUNTER — Ambulatory Visit: Payer: Medicare Other | Admitting: Podiatry

## 2021-03-03 ENCOUNTER — Other Ambulatory Visit: Payer: Self-pay

## 2021-03-03 ENCOUNTER — Encounter: Payer: Self-pay | Admitting: Family Medicine

## 2021-03-03 ENCOUNTER — Ambulatory Visit (INDEPENDENT_AMBULATORY_CARE_PROVIDER_SITE_OTHER): Payer: Medicare Other | Admitting: Family Medicine

## 2021-03-03 VITALS — BP 111/73 | HR 101 | Temp 97.8°F | Ht 70.0 in | Wt 209.0 lb

## 2021-03-03 DIAGNOSIS — I10 Essential (primary) hypertension: Secondary | ICD-10-CM | POA: Diagnosis not present

## 2021-03-03 DIAGNOSIS — E1169 Type 2 diabetes mellitus with other specified complication: Secondary | ICD-10-CM

## 2021-03-03 DIAGNOSIS — E785 Hyperlipidemia, unspecified: Secondary | ICD-10-CM

## 2021-03-03 DIAGNOSIS — D500 Iron deficiency anemia secondary to blood loss (chronic): Secondary | ICD-10-CM | POA: Diagnosis not present

## 2021-03-03 DIAGNOSIS — G8929 Other chronic pain: Secondary | ICD-10-CM | POA: Diagnosis not present

## 2021-03-03 DIAGNOSIS — E119 Type 2 diabetes mellitus without complications: Secondary | ICD-10-CM

## 2021-03-03 DIAGNOSIS — R197 Diarrhea, unspecified: Secondary | ICD-10-CM

## 2021-03-03 DIAGNOSIS — M25561 Pain in right knee: Secondary | ICD-10-CM | POA: Diagnosis not present

## 2021-03-03 NOTE — Progress Notes (Addendum)
Subjective:    Patient ID: [name], female    DOB: 06-03-1961, 60 y.o.   MRN: [id number]  Diarrhea  Has appt with [employer] next month  R knee pain- had fall last month, knee surgery 04/22 Anemia, blood loss - Plan: Basic metabolic panel, Lipid panel, Iron and TIBC, Ferritin, CBC with Differential/Platelet, Hemoglobin A1c  Hyperlipidemia, unspecified hyperlipidemia type - Plan: Basic metabolic panel, Lipid panel, Iron and TIBC, Ferritin, CBC with Differential/Platelet, Hemoglobin A1c  Hyperlipidemia associated with type 2 diabetes mellitus ([place]) - Plan: Basic metabolic panel, Lipid panel, Iron and TIBC, Ferritin, CBC with Differential/Platelet, Hemoglobin A1c  Chronic pain of right knee - Plan: Basic metabolic panel, Lipid panel, Iron and TIBC, Ferritin, CBC with Differential/Platelet, Hemoglobin A1c  Diarrhea, unspecified type - Plan: Basic metabolic panel, Lipid panel, Iron and TIBC, Ferritin, CBC with Differential/Platelet, Hemoglobin A1c  Diabetes mellitus without complication ([place])  Patient hard to tell if she is eating properly or not.  She is dependent on others. She does do some walking with a walker she complains of intermittent right knee pain since her surgery She will see the surgeon for follow-up She relates compliance with her medicines She also relates a lot of diarrhea but does not do the stool studies that gastroenterology has requested her to do She is a very symptomatic patient with a lot of symptomatologies  Patient also relates that she is having a fair amount of diarrhea is very difficult for her to get to the bathroom in time because of her debilitated state and use of a walker therefore she is having problems with fecal incontinence Review of Systems  Gastrointestinal:  Positive for diarrhea.      Objective:   Physical Exam General-in no acute distress Eyes-no discharge Lungs-respiratory rate normal, CTA CV-no murmurs,RRR Extremities skin  warm dry no edema Neuro grossly normal Behavior normal, alert Crepitus left knee       Assessment & Plan:  1. Anemia, blood loss She has had problems with anemia need to further delineate this recheck CBC to look at ferritin TIBC to see if this is iron deficient anemia scheduled to follow-up with gastroenterology in September - Basic metabolic panel - Lipid panel - Iron and TIBC - Ferritin - CBC with Differential/Platelet - Hemoglobin A1c  2. Hyperlipidemia, unspecified hyperlipidemia type Check labs healthy diet - Basic metabolic panel - Lipid panel - Iron and TIBC - Ferritin - CBC with Differential/Platelet - Hemoglobin A1c  3. Hyperlipidemia associated with type 2 diabetes mellitus ([place]) Check labs healthy diet so far have been able to keep diabetes under control with dietary measures - Basic metabolic panel - Lipid panel - Iron and TIBC - Ferritin - CBC with Differential/Platelet - Hemoglobin A1c  4. Chronic pain of right knee Chronic pain in the knee has had surgery on examination I feel this knee looks good and feels good but patient has subjective discomfort recommend Tylenol as needed recommend follow-up with orthopedics - Basic metabolic panel - Lipid panel - Iron and TIBC - Ferritin - CBC with Differential/Platelet - Hemoglobin A1c  5. Diarrhea, unspecified type Chronic diarrhea issues she needs follow-up with GI and do the stool studies as requested - Basic metabolic panel - Lipid panel - Iron and TIBC - Ferritin - CBC with Differential/Platelet - Hemoglobin A1c  6. Diabetes mellitus without complication ([place]) So far keeping this under control through diet and healthy eating  Diarrhea with fecal incontinence see per above because of her debilitated  state and use walker it is necessary for her to have to utilize pull-ups for intermittent diarrhea and or diapers

## 2021-03-04 ENCOUNTER — Ambulatory Visit (INDEPENDENT_AMBULATORY_CARE_PROVIDER_SITE_OTHER): Payer: Medicare Other | Admitting: Thoracic Surgery (Cardiothoracic Vascular Surgery)

## 2021-03-04 ENCOUNTER — Encounter: Payer: Self-pay | Admitting: Podiatry

## 2021-03-04 ENCOUNTER — Ambulatory Visit
Admission: RE | Admit: 2021-03-04 | Discharge: 2021-03-04 | Disposition: A | Discharge status: Home / Self Care | Payer: Medicare Other | Source: Ambulatory Visit | Attending: Thoracic Surgery (Cardiothoracic Vascular Surgery) | Admitting: Thoracic Surgery (Cardiothoracic Vascular Surgery)

## 2021-03-04 ENCOUNTER — Ambulatory Visit (INDEPENDENT_AMBULATORY_CARE_PROVIDER_SITE_OTHER): Payer: Medicare Other | Admitting: Podiatry

## 2021-03-04 ENCOUNTER — Encounter: Payer: Self-pay | Admitting: Family Medicine

## 2021-03-04 VITALS — BP 130/75 | HR 67 | Resp 20 | Ht 70.0 in | Wt 209.0 lb

## 2021-03-04 DIAGNOSIS — B351 Tinea unguium: Secondary | ICD-10-CM | POA: Diagnosis not present

## 2021-03-04 DIAGNOSIS — R911 Solitary pulmonary nodule: Secondary | ICD-10-CM

## 2021-03-04 DIAGNOSIS — M79671 Pain in right foot: Secondary | ICD-10-CM | POA: Diagnosis not present

## 2021-03-04 DIAGNOSIS — M79676 Pain in unspecified toe(s): Secondary | ICD-10-CM

## 2021-03-04 DIAGNOSIS — I700 Atherosclerosis of aorta: Secondary | ICD-10-CM | POA: Diagnosis not present

## 2021-03-04 DIAGNOSIS — E1142 Type 2 diabetes mellitus with diabetic polyneuropathy: Secondary | ICD-10-CM

## 2021-03-04 LAB — CBC WITH DIFFERENTIAL/PLATELET
Basophils Absolute: 0 10*3/uL (ref 0.0–0.2)
Basos: 1 %
EOS (ABSOLUTE): 0 10*3/uL (ref 0.0–0.4)
Eos: 0 %
Hematocrit: 37.9 % (ref 34.0–46.6)
Hemoglobin: 12.8 g/dL (ref 11.1–15.9)
Immature Grans (Abs): 0 10*3/uL (ref 0.0–0.1)
Immature Granulocytes: 0 %
Lymphocytes Absolute: 1.6 10*3/uL (ref 0.7–3.1)
Lymphs: 40 %
MCH: 28.3 pg (ref 26.6–33.0)
MCHC: 33.8 g/dL (ref 31.5–35.7)
MCV: 84 fL (ref 79–97)
Monocytes Absolute: 0.5 10*3/uL (ref 0.1–0.9)
Monocytes: 11 %
Neutrophils Absolute: 1.9 10*3/uL (ref 1.4–7.0)
Neutrophils: 48 %
Platelets: 184 10*3/uL (ref 150–450)
RBC: 4.52 x10E6/uL (ref 3.77–5.28)
RDW: 14.6 % (ref 11.7–15.4)
WBC: 4.1 10*3/uL (ref 3.4–10.8)

## 2021-03-04 LAB — BASIC METABOLIC PANEL
BUN/Creatinine Ratio: 13 (ref 9–23)
BUN: 10 mg/dL (ref 6–24)
CO2: 31 mmol/L — ABNORMAL HIGH (ref 20–29)
Calcium: 9.4 mg/dL (ref 8.7–10.2)
Chloride: 95 mmol/L — ABNORMAL LOW (ref 96–106)
Creatinine, Ser: 0.8 mg/dL (ref 0.57–1.00)
Glucose: 88 mg/dL (ref 65–99)
Potassium: 4 mmol/L (ref 3.5–5.2)
Sodium: 139 mmol/L (ref 134–144)
eGFR: 85 mL/min/{1.73_m2} (ref >59)

## 2021-03-04 LAB — IRON AND TIBC
Iron Saturation: 12 % — ABNORMAL LOW (ref 15–55)
Iron: 49 ug/dL (ref 27–159)
Total Iron Binding Capacity: 396 ug/dL (ref 250–450)
UIBC: 347 ug/dL (ref 131–425)

## 2021-03-04 LAB — LIPID PANEL
Chol/HDL Ratio: 1.8 ratio (ref 0.0–4.4)
Cholesterol, Total: 134 mg/dL (ref 100–199)
HDL: 74 mg/dL (ref >39)
LDL Chol Calc (NIH): 48 mg/dL (ref 0–99)
Triglycerides: 58 mg/dL (ref 0–149)
VLDL Cholesterol Cal: 12 mg/dL (ref 5–40)

## 2021-03-04 LAB — HEMOGLOBIN A1C
Est. average glucose Bld gHb Est-mCnc: 134 mg/dL
Hgb A1c MFr Bld: 6.3 % — ABNORMAL HIGH (ref 4.8–5.6)

## 2021-03-04 LAB — FERRITIN: Ferritin: 26 ng/mL (ref 15–150)

## 2021-03-04 NOTE — Progress Notes (Signed)
Please send a copy of this letter to the patient as well as to her social worker thank you

## 2021-03-04 NOTE — Progress Notes (Signed)
     [address]       [place] [address]             [phone]       [name] is a 60 year old female has been followed by Dr. Servando Snare for a right middle lobe pulmonary nodule that essentially has been stable for several years.  I personally reviewed her cross-sectional imaging, and it has very smooth borders and the size is also stable.  She has never had a PET/CT to evaluate for activity does have ordered one for further evaluation.  I have also made a referral to Dr.Icard to assess the possibility of undergoing robotic assisted bronchoscopy.  It likely will be negative, but if it is positive for malignancy it is questionable as to whether or not she would be a good surgical candidate given her frailty.  She continues to smoke, and walks with a walker for assistance.  She is also chronically debilitated and presents with a case manager today.  I will touch base with her after her appointment with Dr. Valeta Harms.  [name]

## 2021-03-04 NOTE — Patient Instructions (Signed)
Apply Ephraim Hamburger Cream to arch of right foot per package instructions.   Recommend New Balance Sneakers 600 series or higher at [employer] or [employer].   You may also purchase Spenco Arch Supports at [employer] or [employer].

## 2021-03-07 ENCOUNTER — Other Ambulatory Visit: Payer: Self-pay | Admitting: Thoracic Surgery (Cardiothoracic Vascular Surgery)

## 2021-03-07 DIAGNOSIS — R911 Solitary pulmonary nodule: Secondary | ICD-10-CM

## 2021-03-08 NOTE — Progress Notes (Signed)
Subjective:  Patient ID: [name], female    DOB: [birth year],  MRN: [id number]  [name] presents to clinic today for at risk foot care with history of diabetic neuropathy and painful thick toenails that are difficult to trim. Pain interferes with ambulation. Aggravating factors include wearing enclosed shoe gear. Pain is relieved with periodic professional debridement.  She is accompanied by her Education officer, museum, Scientist, research (life sciences).   Ms. Ammar relates arch pain of the right foot for the past several weeks. She denies any preceding episodes of trauma today.   Patient did not check blood glucose today.  PCP is Kathyrn Drown, MD , and last visit was 03/03/2021.  Allergies  Allergen Reactions   Thorazine [Chlorpromazine Hcl] Anaphylaxis   Acetaminophen Other (See Comments)    Makes pt dizzy   Aspirin Other (See Comments)    seizure   Aspirin-Acetaminophen-Caffeine Other (See Comments)    seizure   Nsaids Nausea And Vomiting   Other     Acidic foods   Penicillins Nausea And Vomiting    Has patient had a PCN reaction causing immediate rash, facial/tongue/throat swelling, SOB or lightheadedness with hypotension:Yes Has patient had a PCN reaction causing severe rash involving mucus membranes or skin necrosis:Yes Has patient had a PCN reaction that required hospitalization:Yes Has patient had a PCN reaction occurring within the last 10 years:No If all of the above answers are "NO", then may proceed with Cephalosporin use.    Tomato Rash    Review of Systems: Negative except as noted in the HPI. Objective:   Constitutional [name] is a pleasant 60 y.o. African American female, in NAD. AAO x 3.   Vascular Capillary fill time to digits <3 seconds b/l lower extremities. Nonpalpable DP pulse(s) b/l lower extremities. Nonpalpable PT pulse(s) b/l lower extremities. Pedal hair absent. Lower extremity skin temperature gradient within normal limits. No edema noted b/l lower  extremities. No cyanosis or clubbing noted.  Neurologic Normal speech. Oriented to person, place, and time. Protective sensation diminished with 10g monofilament b/l. Vibratory sensation intact b/l.  Dermatologic Skin warm and supple b/l lower extremities. No open wounds b/l lower extremities. No interdigital macerations b/l lower extremities. Toenails 1-5 b/l elongated, discolored, dystrophic, thickened, crumbly with subungual debris and tenderness to dorsal palpation.  Orthopedic: Normal muscle strength 5/5 to all lower extremity muscle groups bilaterally. Hallux valgus with bunion deformity noted b/l lower extremities. Hammertoe(s) noted to the b/l lower extremities. Pes planus deformity noted b/l lower extremities. Wearing flat shoes which are unsupportive.She has pain along central arch band. No erythema, no warmth, no defect to indicate tear.   Radiographs: None Assessment:   1. Pain due to onychomycosis of toenail   2. Arch pain of right foot   3. Diabetic polyneuropathy associated with type 2 diabetes mellitus ([place])    Plan:  -Examined patient. -She is wearing unsupportive shoe gear on today's visit which doesn't offer any arch support. Recommended New Balance sneakers 600 series or higher. Recommend Spenco Arch Supports at [employer] or [employer]. She may apply Ephraim Hamburger Cream to arch of right foot per package instructions. NSAIDs cause nausea/vomiting. -Continue diabetic foot care principles: inspect feet daily, monitor glucose as recommended by PCP and/or Endocrinologist, and follow prescribed diet per PCP, Endocrinologist and/or dietician. -Patient to continue soft, supportive shoe gear daily. -Toenails 1-5 b/l were debrided in length and girth with sterile nail nippers and dremel without iatrogenic bleeding.  -Patient to report any pedal injuries  to medical professional immediately. -Patient/POA to call should there be question/concern in the interim.  Return in about 3  months (around 06/04/2021).  Marzetta Board, DPM

## 2021-03-09 ENCOUNTER — Telehealth: Payer: Self-pay

## 2021-03-09 NOTE — Telephone Encounter (Signed)
-----   Message from Garner Nash, DO sent at 03/08/2021  1:12 PM EDT ----- See below can you help? Thanks Brad   ----- Message ----- From: Garner Nash, DO Sent: 03/07/2021  10:58 AM EDT To: Waldemar Dickens Pinion, CMA   Raquel Sarna,   Please schedule with me after her PET scan which is scheduled for 9/12 I believe  Any time after is ok. Please use nodule slot.   Thanks  Leory Plowman     ----- Message ----- From: Lajuana Matte, MD Sent: 03/04/2021   4:40 PM EDT To: Garner Nash, DO  Hey, This is another 1 of Dr. 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  I have ordered a PET/CT and sent her over to you.  I do think that she is a good surgical candidate but may be should be a candidate for bronchoscopy if you think it is necessary.  Harrell Bary Leriche

## 2021-03-09 NOTE — Telephone Encounter (Signed)
LMTCB

## 2021-03-09 NOTE — Telephone Encounter (Signed)
Appt made for 03/28/21 at 3pm.   Need to be sure this is okay with patient.   Will route to triage to try back later.

## 2021-03-16 NOTE — Progress Notes (Signed)
No Show

## 2021-03-21 ENCOUNTER — Other Ambulatory Visit (HOSPITAL_COMMUNITY): Payer: Medicare Other

## 2021-03-22 ENCOUNTER — Telehealth: Payer: Self-pay | Admitting: Family Medicine

## 2021-03-22 NOTE — Telephone Encounter (Signed)
Done. thanks

## 2021-03-22 NOTE — Telephone Encounter (Signed)
[name] came by office requesting a prescription for adult pull ups XL called into [facility].  CB# [phone]

## 2021-03-22 NOTE — Telephone Encounter (Signed)
Script written out and placed on provider door if agree. Please advise. Thank you

## 2021-03-22 NOTE — Telephone Encounter (Signed)
Script faxed to [facility]. Detailed message left on social worker phone

## 2021-03-23 ENCOUNTER — Other Ambulatory Visit: Payer: Self-pay | Admitting: Family Medicine

## 2021-03-23 NOTE — Telephone Encounter (Signed)
6 months on each

## 2021-03-28 ENCOUNTER — Ambulatory Visit: Payer: Medicare Other | Admitting: Pulmonary Disease

## 2021-03-28 DIAGNOSIS — R270 Ataxia, unspecified: Secondary | ICD-10-CM | POA: Diagnosis not present

## 2021-03-28 DIAGNOSIS — F17200 Nicotine dependence, unspecified, uncomplicated: Secondary | ICD-10-CM | POA: Diagnosis not present

## 2021-03-28 DIAGNOSIS — M170 Bilateral primary osteoarthritis of knee: Secondary | ICD-10-CM | POA: Diagnosis not present

## 2021-03-29 ENCOUNTER — Ambulatory Visit: Payer: Medicare Other | Admitting: Gastroenterology

## 2021-03-29 ENCOUNTER — Encounter: Payer: Self-pay | Admitting: Internal Medicine

## 2021-03-29 DIAGNOSIS — M170 Bilateral primary osteoarthritis of knee: Secondary | ICD-10-CM | POA: Diagnosis not present

## 2021-03-29 DIAGNOSIS — F17200 Nicotine dependence, unspecified, uncomplicated: Secondary | ICD-10-CM | POA: Diagnosis not present

## 2021-03-29 DIAGNOSIS — R270 Ataxia, unspecified: Secondary | ICD-10-CM | POA: Diagnosis not present

## 2021-03-30 DIAGNOSIS — F17200 Nicotine dependence, unspecified, uncomplicated: Secondary | ICD-10-CM | POA: Diagnosis not present

## 2021-03-30 DIAGNOSIS — R270 Ataxia, unspecified: Secondary | ICD-10-CM | POA: Diagnosis not present

## 2021-03-30 DIAGNOSIS — M170 Bilateral primary osteoarthritis of knee: Secondary | ICD-10-CM | POA: Diagnosis not present

## 2021-03-31 DIAGNOSIS — M170 Bilateral primary osteoarthritis of knee: Secondary | ICD-10-CM | POA: Diagnosis not present

## 2021-03-31 DIAGNOSIS — F17200 Nicotine dependence, unspecified, uncomplicated: Secondary | ICD-10-CM | POA: Diagnosis not present

## 2021-03-31 DIAGNOSIS — R270 Ataxia, unspecified: Secondary | ICD-10-CM | POA: Diagnosis not present

## 2021-04-01 ENCOUNTER — Other Ambulatory Visit: Payer: Self-pay

## 2021-04-01 ENCOUNTER — Encounter (HOSPITAL_COMMUNITY)
Admission: RE | Admit: 2021-04-01 | Discharge: 2021-04-01 | Disposition: A | Discharge status: Home / Self Care | Payer: Medicare Other | Source: Ambulatory Visit | Attending: Thoracic Surgery (Cardiothoracic Vascular Surgery) | Admitting: Thoracic Surgery (Cardiothoracic Vascular Surgery)

## 2021-04-01 DIAGNOSIS — I700 Atherosclerosis of aorta: Secondary | ICD-10-CM | POA: Insufficient documentation

## 2021-04-01 DIAGNOSIS — R911 Solitary pulmonary nodule: Secondary | ICD-10-CM | POA: Insufficient documentation

## 2021-04-01 DIAGNOSIS — R918 Other nonspecific abnormal finding of lung field: Secondary | ICD-10-CM | POA: Diagnosis not present

## 2021-04-01 DIAGNOSIS — M19011 Primary osteoarthritis, right shoulder: Secondary | ICD-10-CM | POA: Diagnosis not present

## 2021-04-01 LAB — GLUCOSE, CAPILLARY: Glucose-Capillary: 105 mg/dL — ABNORMAL HIGH (ref 70–99)

## 2021-04-01 MED ORDER — TECHNETIUM TC 99M SULFUR COLLOID FILTERED: 10.4400 | Freq: Once | INTRAVENOUS | Status: DC | PRN

## 2021-04-01 MED ORDER — FLUDEOXYGLUCOSE F - 18 (FDG) INJECTION
10.4400 | Freq: Once | INTRAVENOUS | Status: AC | PRN
  Administered 2021-04-01: 10.44 via INTRAVENOUS

## 2021-04-04 NOTE — Telephone Encounter (Signed)
This has been addressed. Nothing further needed at this time.  

## 2021-04-08 ENCOUNTER — Other Ambulatory Visit: Payer: Self-pay

## 2021-04-08 ENCOUNTER — Encounter: Payer: Self-pay | Admitting: Internal Medicine

## 2021-04-08 ENCOUNTER — Ambulatory Visit (INDEPENDENT_AMBULATORY_CARE_PROVIDER_SITE_OTHER): Payer: Medicare Other | Admitting: Internal Medicine

## 2021-04-08 VITALS — BP 137/81 | HR 84 | Temp 97.1°F | Ht 70.5 in | Wt 218.0 lb

## 2021-04-08 DIAGNOSIS — Z8619 Personal history of other infectious and parasitic diseases: Secondary | ICD-10-CM

## 2021-04-08 DIAGNOSIS — R197 Diarrhea, unspecified: Secondary | ICD-10-CM

## 2021-04-08 DIAGNOSIS — R1310 Dysphagia, unspecified: Secondary | ICD-10-CM

## 2021-04-08 NOTE — Progress Notes (Signed)
ERROR

## 2021-04-08 NOTE — Patient Instructions (Signed)
It was good to see you again today!  Continue Bentyl 4 times a day as needed for diarrhea  Continue Aciphex or rabeprazole daily for GERD.  We need to get stool samples submitted so we could figure out the cause of your diarrhea  Needed GIP, C. difficile on a stool sample TODAY  If cause of diarrhea is not found with stool studies, you will need a colonoscopy.  [facility] hemorrhoid cream; add lidocaine.  Dispense 30 g.  Apply a pea-sized amount to the anorectum 3 times a day as needed

## 2021-04-08 NOTE — Progress Notes (Signed)
Primary Care Physician:  Kathyrn Drown, MD Primary Gastroenterologist:  Dr. Gala Romney  Pre-Procedure History & Physical: HPI:  [name] is a 60 y.o. female here for diarrhea.  History of GERD well-controlled on rabeprazole.  History of H. pylori treated but not eradicated based on breath test earlier this year.  She was retreated with Pylera.  Her main issue today is that of diarrhea.  She states she has 12 loose bowel movements a day sometimes she sees a   little bit of blood.  No fever, chills, nausea or vomiting.  She denies dysphagia  - status post empiric esophageal dilation previously.  Is up at night to have a bowel movement.  Occasional accidents.  Never has a formed stool.  Patient complains of an irritated anorectum.  History of colonic adenomas removed previously; due for surveillance exam next year.  We have been attempting to get stool samples done but have not been successful through a good part of this year.  She has been able to eat.  In fact, she is gained 9 pounds since her January office visit here.  Past Medical History:  Diagnosis Date   Anxiety    Aortic atherosclerosis ([place]) 09/19/2020   Seen on CAT scan 2021   Arthritis    Depression    Diabetes mellitus    Diabetes mellitus, type II ([place])    Dyspnea    Dysrhythmia    GERD (gastroesophageal reflux disease)    Heart murmur    HTN (hypertension)    Hyperglycemia    IBS (irritable bowel syndrome)    Lung nodules    right, followed by PCP, PET 11/2011   Mental retardation    MI (myocardial infarction) ([place])    Migraines    Pneumonia    Sleep apnea    cpap    Stroke [facility])     Past Surgical History:  Procedure Laterality Date   ABDOMINAL HYSTERECTOMY     BIOPSY  01/15/2020   Procedure: BIOPSY;  Surgeon: Daneil Dolin, MD;  [facility]: [facility];  Service: Endoscopy;;  gastric   COLONOSCOPY  11/2007   hyperplastic polyps, prior hx of adenomas    COLONOSCOPY  05/2010   incomplete due to poor  prep, hyperplastic rectal polyp   COLONOSCOPY  05/05/2002   Dimunitive polyps in the rectum and left colon, cold    biopsied/removed.  Scattered few left-sided diverticula.  Regular colonic   mucosa appeared normal   COLONOSCOPY N/A 05/28/2013   Hilberto Burzynski: mulitple tubular adenomas removed. next tcs 05/2016   COLONOSCOPY WITH PROPOFOL N/A 09/07/2016   Dr. Gala Romney: For hyperplastic polyps removed. Next colonoscopy March 2023 given history of adenomatous colon polyps in the past.   ESOPHAGOGASTRODUODENOSCOPY  08/2007   moderate sized hiatal hernia   ESOPHAGOGASTRODUODENOSCOPY  05/2010   noncritical schatzki ring s/p 23F   ESOPHAGOGASTRODUODENOSCOPY (EGD) WITH ESOPHAGEAL DILATION N/A 02/06/2013   OAC:ZYSAYT esophagus-s/p dilation up to a 49 [place] size with [facility] dilators.  Hiatal hernia   ESOPHAGOGASTRODUODENOSCOPY (EGD) WITH PROPOFOL N/A 09/07/2016   Dr. Gala Romney: Normal, status post empiric dilation of the esophagus for history of dysphagia   ESOPHAGOGASTRODUODENOSCOPY (EGD) WITH PROPOFOL N/A 05/02/2019   Normal esophagus s/p dilation, normal stomach, normal duodenum   ESOPHAGOGASTRODUODENOSCOPY (EGD) WITH PROPOFOL N/A 01/15/2020   normal esophagus s/p dilation, gastric nodule s/p biopsy. This showed reactive gastropathy with H.pylori.    EXTERNAL EAR SURGERY     bilateral   FOOT SURGERY  GLAUCOMA SURGERY     KNEE ARTHROPLASTY Right 11/04/2020   Procedure: COMPUTER ASSISTED TOTAL KNEE ARTHROPLASTY;  Surgeon: Rod Can, MD;  Location: [facility];  Service: Orthopedics;  Laterality: Right;   MALONEY DILATION N/A 09/07/2016   Procedure: Venia Minks DILATION;  Surgeon: Daneil Dolin, MD;  [facility];  Service: Endoscopy;  Laterality: N/A;   MALONEY DILATION N/A 05/02/2019   Procedure: Venia Minks DILATION;  Surgeon: Daneil Dolin, MD;  [facility];  Service: Endoscopy;  Laterality: N/A;   MALONEY DILATION N/A 01/15/2020   Procedure: Venia Minks DILATION;  Surgeon: Daneil Dolin, MD;   [facility];  Service: Endoscopy;  Laterality: N/A;   POLYPECTOMY  09/07/2016   Procedure: POLYPECTOMY;  Surgeon: Daneil Dolin, MD;  [facility];  Service: Endoscopy;;  sigmoid colon x4   small bowel capsule  10/2007   normal    Prior to Admission medications   Medication Sig Start Date End Date Taking? Authorizing Provider  atorvastatin (LIPITOR) 10 MG tablet TAKE 1 TABLET BY MOUTH ONCE A DAY. 10/25/20  Yes Luking, Elayne Snare, MD  benztropine (COGENTIN) 1 MG tablet Take 1 tablet (1 mg total) by mouth daily. 02/15/21  Yes Cloria Spring, MD  blood glucose meter kit and supplies Dispense based on patient and insurance preference. Use to test glucose once daily. (FOR ICD10, E11.9). 03/24/20  Yes Luking, Elayne Snare, MD  cycloSPORINE (RESTASIS) 0.05 % ophthalmic emulsion Place 1 drop into both eyes 2 (two) times daily.   Yes [provider]  dicyclomine (BENTYL) 10 MG capsule Take 1 capsule (10 mg total) by mouth 3 (three) times daily before meals. As needed for loose stool and belly cramping. 02/17/21  Yes Jodi Mourning, Kristen S, PA-C  docusate sodium (COLACE) 100 MG capsule TAKE 1 CAPSULE BY MOUTH TWICE DAILY 03/24/21  Yes Kathyrn Drown, MD  lamoTRIgine (LAMICTAL) 100 MG tablet Take 1 tablet (100 mg total) by mouth 2 (two) times daily. 02/15/21  Yes Cloria Spring, MD  loperamide (IMODIUM) 2 MG capsule Take by mouth 2 (two) times daily. 02/04/21  Yes [provider]  mupirocin ointment (BACTROBAN) 2 % SMARTSIG:1 Application Topical 2-3 Times Daily 10/26/20  Yes [provider]  ondansetron (ZOFRAN) 4 MG tablet Take 1 tablet (4 mg total) by mouth every 6 (six) hours as needed for nausea. 11/09/20  Yes Cherlynn June B, PA  oxyCODONE (OXY IR/ROXICODONE) 5 MG immediate release tablet Take by mouth. 11/30/20  Yes [provider]  paliperidone (INVEGA) 9 MG 24 hr tablet Take 1 tablet (9 mg total) by mouth daily. 02/15/21  Yes Cloria Spring, MD  polyethylene  glycol (MIRALAX / GLYCOLAX) 17 g packet Take 17 g by mouth daily as needed for moderate constipation.   Yes [provider]  potassium chloride (KLOR-CON) 10 MEQ tablet TAKE 2 TABLETS (20MEQ) BY MOUTH TWICE DAILY WITH FOOD 12/21/20  Yes Luking, Elayne Snare, MD  propranolol (INDERAL) 10 MG tablet Take 1 tablet (10 mg total) by mouth 3 (three) times daily. 02/24/21  Yes Kathyrn Drown, MD  RABEprazole (ACIPHEX) 20 MG tablet Take 1 tablet (20 mg total) by mouth 2 (two) times daily before a meal. 02/17/21  Yes Harper, Kristen S, PA-C  senna (SENNA-TABS) 8.6 MG tablet TAKE 1 TABLET BY MOUTH 2 TIMES A DAY Patient taking differently: As needed constipation 03/24/21  Yes Luking, Scott A, MD  sertraline (ZOLOFT) 100 MG tablet Take 1 tablet (100  mg total) by mouth daily. 02/22/21  Yes Cloria Spring, MD  torsemide (DEMADEX) 20 MG tablet TAKE (1) TABLET BY MOUTH EACH MORNING. 10/25/20  Yes Kathyrn Drown, MD  traZODone (DESYREL) 100 MG tablet TAKE 2 TABLETS($RemoveBefore'200MG'dZQaINfeNosXG$ ) BY MOUTH AT BEDTIME. 02/22/21  Yes Cloria Spring, MD    Allergies as of 04/08/2021 - Review Complete 04/08/2021  Allergen Reaction Noted   Thorazine [chlorpromazine hcl] Anaphylaxis 10/20/2010   Acetaminophen Other (See Comments)    Aspirin Other (See Comments)    Aspirin-acetaminophen-caffeine Other (See Comments)    Nsaids Nausea And Vomiting    Other  03/22/2019   Penicillins Nausea And Vomiting    Tomato Rash 01/17/2012    Family History  Problem Relation Age of Onset   Stroke Mother    Heart attack Father    Schizophrenia Other    Drug abuse Other    Alcohol abuse Other    Colon cancer Other        aunt   Obesity Other    COPD Other    Anxiety disorder Other    GER disease Other    Diabetes type II Other    Anxiety disorder Other    Depression Other    Depression Sister    Schizophrenia Sister    Liver disease Neg Hx    Inflammatory bowel disease Neg Hx     Social History   Socioeconomic History   Marital  status: Divorced    Spouse name: Not on file   Number of children: Not on file   Years of education: Not on file   Highest education level: High school graduate  Occupational History   Occupation: disabled    Employer: UNEMPLOYED  Tobacco Use   Smoking status: Every Day    Packs/day: 0.50    Years: 40.00    Pack years: 20.00    Types: Cigarettes   Smokeless tobacco: Never  Vaping Use   Vaping Use: Never used  Substance and Sexual Activity   Alcohol use: No   Drug use: No   Sexual activity: Not Currently    Partners: Male    Birth control/protection: Surgical    Comment: hyst  Other Topics Concern   Not on file  Social History Narrative   Not on file   Social Determinants of Health   Financial Resource Strain: Low Risk    Difficulty of Paying Living Expenses: Not hard at all  Food Insecurity: No Food Insecurity   Worried About Charity fundraiser in the Last Year: Never true   [place] in the Last Year: Never true  Transportation Needs: No Transportation Needs   Lack of Transportation (Medical): No   Lack of Transportation (Non-Medical): No  Physical Activity: Insufficiently Active   Days of Exercise per Week: 3 days   Minutes of Exercise per Session: 30 min  Stress: Stress Concern Present   Feeling of Stress : To some extent  Social Connections: Moderately Isolated   Frequency of Communication with Friends and Family: More than three times a week   Frequency of Social Gatherings with Friends and Family: More than three times a week   Attends Religious Services: 1 to 4 times per year   Active Member of [employer] or Organizations: No   Attends Archivist Meetings: Never   Marital Status: Separated  Intimate Partner Violence: Not At Risk   Fear of Current or Ex-Partner: No   Emotionally Abused: No   Physically  Abused: No   Sexually Abused: No    Review of Systems: See HPI, otherwise negative ROS  Physical Exam: BP 137/81   Pulse 84   Temp (!)  97.1 F (36.2 C)   Ht 5' 10.5" (1.791 m)   Wt 218 lb (98.9 kg)   BMI 30.84 kg/m  General:   Alert, conversant.  Pleasant.  Ambulates with a Rollator.   Neck:  Supple; no masses or thyromegaly. No significant cervical adenopathy. Lungs:  Clear throughout to auscultation.   No wheezes, crackles, or rhonchi. No acute distress. Heart:  Regular rate and rhythm; no murmurs, clicks, rubs,  or gallops. Abdomen: Non-distended, normal bowel sounds.  Soft and nontender without appreciable mass or hepatosplenomegaly.  Pulses:  Normal pulses noted. Extremities:  Without clubbing or edema.  Impression/Plan: 60 year old lady with significant diarrhea over the past several months.  Stool studies not yet done.  Paper hematochezia.  History of colonic adenoma.  Etiology of her diarrhea is not clear.  Need to consider possibly microscopic colitis.  I doubt medication effect.  She has incessant diarrhea a nocturnal component.  Not likely irritable bowel syndrome.  She has been exposed to antibiotics for treatment of H. pylori.   Recommendations:  Continue Bentyl 4 times a day as needed for diarrhea  Continue Aciphex or rabeprazole daily for GERD.  We need to get stool samples submitted so we could figure out the cause of diarrhea  Needed GIP, C. difficile on a stool sample TODAY  If cause of diarrhea is not found with stool studies, she will need a colonoscopy.  [facility] hemorrhoid cream; add lidocaine.  Dispense 30 g.  Apply a pea-sized amount to the anorectum 3 times a day as needed  Once diarrheal illness sorted out, we will circle back around and obtain stool antigen testing to document eradication of H. pylori.    Notice: This dictation was prepared with Dragon dictation along with smaller phrase technology. Any transcriptional errors that result from this process are unintentional and may not be corrected upon review.

## 2021-04-11 ENCOUNTER — Telehealth: Payer: Self-pay

## 2021-04-11 DIAGNOSIS — M170 Bilateral primary osteoarthritis of knee: Secondary | ICD-10-CM | POA: Diagnosis not present

## 2021-04-11 DIAGNOSIS — F17200 Nicotine dependence, unspecified, uncomplicated: Secondary | ICD-10-CM | POA: Diagnosis not present

## 2021-04-11 DIAGNOSIS — R270 Ataxia, unspecified: Secondary | ICD-10-CM | POA: Diagnosis not present

## 2021-04-11 NOTE — Telephone Encounter (Signed)
Cecille Rubin returned call and she left message for the pt Friday and call her today and discussed the price with her and she will pick up Rx later today for hemorrhoid cream.

## 2021-04-11 NOTE — Telephone Encounter (Signed)
LMOVM of Lori @ [facility] to return call regarding the pt's Rx for hemorrhoid cream.

## 2021-04-12 DIAGNOSIS — R270 Ataxia, unspecified: Secondary | ICD-10-CM | POA: Diagnosis not present

## 2021-04-12 DIAGNOSIS — M170 Bilateral primary osteoarthritis of knee: Secondary | ICD-10-CM | POA: Diagnosis not present

## 2021-04-12 DIAGNOSIS — F17200 Nicotine dependence, unspecified, uncomplicated: Secondary | ICD-10-CM | POA: Diagnosis not present

## 2021-04-13 DIAGNOSIS — R270 Ataxia, unspecified: Secondary | ICD-10-CM | POA: Diagnosis not present

## 2021-04-13 DIAGNOSIS — F17200 Nicotine dependence, unspecified, uncomplicated: Secondary | ICD-10-CM | POA: Diagnosis not present

## 2021-04-13 DIAGNOSIS — M170 Bilateral primary osteoarthritis of knee: Secondary | ICD-10-CM | POA: Diagnosis not present

## 2021-04-14 DIAGNOSIS — F17200 Nicotine dependence, unspecified, uncomplicated: Secondary | ICD-10-CM | POA: Diagnosis not present

## 2021-04-14 DIAGNOSIS — M170 Bilateral primary osteoarthritis of knee: Secondary | ICD-10-CM | POA: Diagnosis not present

## 2021-04-14 DIAGNOSIS — R270 Ataxia, unspecified: Secondary | ICD-10-CM | POA: Diagnosis not present

## 2021-04-15 DIAGNOSIS — R270 Ataxia, unspecified: Secondary | ICD-10-CM | POA: Diagnosis not present

## 2021-04-15 DIAGNOSIS — M170 Bilateral primary osteoarthritis of knee: Secondary | ICD-10-CM | POA: Diagnosis not present

## 2021-04-15 DIAGNOSIS — F17200 Nicotine dependence, unspecified, uncomplicated: Secondary | ICD-10-CM | POA: Diagnosis not present

## 2021-04-18 ENCOUNTER — Encounter: Payer: Self-pay | Admitting: Pulmonary Disease

## 2021-04-18 ENCOUNTER — Ambulatory Visit (INDEPENDENT_AMBULATORY_CARE_PROVIDER_SITE_OTHER): Payer: Medicare Other | Admitting: Pulmonary Disease

## 2021-04-18 ENCOUNTER — Other Ambulatory Visit: Payer: Self-pay

## 2021-04-18 VITALS — BP 110/64 | HR 66 | Temp 98.2°F | Ht 70.5 in | Wt 219.8 lb

## 2021-04-18 DIAGNOSIS — R911 Solitary pulmonary nodule: Secondary | ICD-10-CM

## 2021-04-18 DIAGNOSIS — Z720 Tobacco use: Secondary | ICD-10-CM

## 2021-04-18 DIAGNOSIS — G4733 Obstructive sleep apnea (adult) (pediatric): Secondary | ICD-10-CM | POA: Diagnosis not present

## 2021-04-18 DIAGNOSIS — F17200 Nicotine dependence, unspecified, uncomplicated: Secondary | ICD-10-CM | POA: Diagnosis not present

## 2021-04-18 NOTE — Patient Instructions (Addendum)
Thank you for visiting Dr. Valeta Harms at [facility]. Today we recommend the following:  Orders Placed This Encounter  Procedures   Procedural/ Surgical Case Request: VIDEO BRONCHOSCOPY WITH ENDOBRONCHIAL NAVIGATION   CT Angio Chest W/Cm &/Or Wo Cm   Ambulatory referral to Pulmonology   CTA Chest completed first  Bronchoscopy on 05/17/2021  Return in about 6 weeks (around 05/30/2021) for with APP or Dr. Valeta Harms.    Please do your part to reduce the spread of COVID-19.

## 2021-04-18 NOTE — Progress Notes (Signed)
Synopsis: Referred in October 2022 for lung nodule by Lajuana Matte, MD  Subjective:   PATIENT ID: [name] GENDER: female DOB: 1961-06-02, MRN: [id number]  Chief Complaint  Patient presents with   Consult    Patient is here because she had a PET scan on 04/01/21. Patient has shortness of breath with exertion.    This is a 60 year old female, past medical history of diabetes, GERD, hypertension, hyperglycemia, MI, sleep apnea on CPAP.  Patient has been followed for a long time by Dr. Pia Mau thoracic surgery for evaluation of a right middle lobe lung nodule.  Lung nodule is macrolobulated and adjacent to the right heart border.  She had a recent nuclear medicine PET scan that showed PET avid uptake within the right heart border.  Unclear whether or not this is correlates with the nodule lesion on CT.  There could be some CT to PET scan body divergence on the overlay images.  There is also question whether or not this could represent a AVM.   Past Medical History:  Diagnosis Date   Anxiety    Aortic atherosclerosis ([place]) 09/19/2020   Seen on CAT scan 2021   Arthritis    Depression    Diabetes mellitus    Diabetes mellitus, type II (HCC)    Dyspnea    Dysrhythmia    GERD (gastroesophageal reflux disease)    Heart murmur    HTN (hypertension)    Hyperglycemia    IBS (irritable bowel syndrome)    Lung nodules    right, followed by PCP, PET 11/2011   Mental retardation    MI (myocardial infarction) ([place])    Migraines    Pneumonia    Sleep apnea    cpap    Stroke ([place])      Family History  Problem Relation Age of Onset   Stroke Mother    Heart attack Father    Schizophrenia Other    Drug abuse Other    Alcohol abuse Other    Colon cancer Other        aunt   Obesity Other    COPD Other    Anxiety disorder Other    GER disease Other    Diabetes type II Other    Anxiety disorder Other    Depression Other    Depression Sister    Schizophrenia Sister     Liver disease Neg Hx    Inflammatory bowel disease Neg Hx      Past Surgical History:  Procedure Laterality Date   ABDOMINAL HYSTERECTOMY     BIOPSY  01/15/2020   Procedure: BIOPSY;  Surgeon: Daneil Dolin, MD;  [facility];  Service: Endoscopy;;  gastric   COLONOSCOPY  11/2007   hyperplastic polyps, prior hx of adenomas    COLONOSCOPY  05/2010   incomplete due to poor prep, hyperplastic rectal polyp   COLONOSCOPY  05/05/2002   Dimunitive polyps in the rectum and left colon, cold    biopsied/removed.  Scattered few left-sided diverticula.  Regular colonic   mucosa appeared normal   COLONOSCOPY N/A 05/28/2013   Rourk: mulitple tubular adenomas removed. next tcs 05/2016   COLONOSCOPY WITH PROPOFOL N/A 09/07/2016   Dr. Gala Romney: For hyperplastic polyps removed. Next colonoscopy March 2023 given history of adenomatous colon polyps in the past.   ESOPHAGOGASTRODUODENOSCOPY  08/2007   moderate sized hiatal hernia   ESOPHAGOGASTRODUODENOSCOPY  05/2010   noncritical schatzki ring s/p 26F   ESOPHAGOGASTRODUODENOSCOPY (EGD) WITH  ESOPHAGEAL DILATION N/A 02/06/2013   OLI:DCVUDT esophagus-s/p dilation up to a 25 [place] size with [facility] dilators.  Hiatal hernia   ESOPHAGOGASTRODUODENOSCOPY (EGD) WITH PROPOFOL N/A 09/07/2016   Dr. Gala Romney: Normal, status post empiric dilation of the esophagus for history of dysphagia   ESOPHAGOGASTRODUODENOSCOPY (EGD) WITH PROPOFOL N/A 05/02/2019   Normal esophagus s/p dilation, normal stomach, normal duodenum   ESOPHAGOGASTRODUODENOSCOPY (EGD) WITH PROPOFOL N/A 01/15/2020   normal esophagus s/p dilation, gastric nodule s/p biopsy. This showed reactive gastropathy with H.pylori.    EXTERNAL EAR SURGERY     bilateral   FOOT SURGERY     GLAUCOMA SURGERY     KNEE ARTHROPLASTY Right 11/04/2020   Procedure: COMPUTER ASSISTED TOTAL KNEE ARTHROPLASTY;  Surgeon: Rod Can, MD;  Location: [facility];  Service: Orthopedics;  Laterality: Right;   MALONEY DILATION N/A  09/07/2016   Procedure: Venia Minks DILATION;  Surgeon: Daneil Dolin, MD;  [facility];  Service: Endoscopy;  Laterality: N/A;   MALONEY DILATION N/A 05/02/2019   Procedure: Venia Minks DILATION;  Surgeon: Daneil Dolin, MD;  [facility];  Service: Endoscopy;  Laterality: N/A;   MALONEY DILATION N/A 01/15/2020   Procedure: Venia Minks DILATION;  Surgeon: Daneil Dolin, MD;  [facility];  Service: Endoscopy;  Laterality: N/A;   POLYPECTOMY  09/07/2016   Procedure: POLYPECTOMY;  Surgeon: Daneil Dolin, MD;  [facility];  Service: Endoscopy;;  sigmoid colon x4   small bowel capsule  10/2007   normal    Social History   Socioeconomic History   Marital status: Divorced    Spouse name: Not on file   Number of children: Not on file   Years of education: Not on file   Highest education level: High school graduate  Occupational History   Occupation: disabled    Employer: UNEMPLOYED  Tobacco Use   Smoking status: Every Day    Packs/day: 0.50    Years: 40.00    Pack years: 20.00    Types: Cigarettes   Smokeless tobacco: Never   Tobacco comments:    Patient smokes quarter pack a day  Vaping Use   Vaping Use: Never used  Substance and Sexual Activity   Alcohol use: No   Drug use: No   Sexual activity: Not Currently    Partners: Male    Birth control/protection: Surgical    Comment: hyst  Other Topics Concern   Not on file  Social History Narrative   Not on file   Social Determinants of Health   Financial Resource Strain: Low Risk    Difficulty of Paying Living Expenses: Not hard at all  Food Insecurity: No Food Insecurity   Worried About Charity fundraiser in the Last Year: Never true   Ran Out of Food in the Last Year: Never true  Transportation Needs: No Transportation Needs   Lack of Transportation (Medical): No   Lack of Transportation (Non-Medical): No  Physical Activity: Insufficiently Active   Days of Exercise per Week: 3 days    Minutes of Exercise per Session: 30 min  Stress: Stress Concern Present   Feeling of Stress : To some extent  Social Connections: Moderately Isolated   Frequency of Communication with Friends and Family: More than three times a week   Frequency of Social Gatherings with Friends and Family: More than three times a week   Attends Religious Services: 1 to 4 times per year   Active Member of Clubs or  Organizations: No   Attends Archivist Meetings: Never   Marital Status: Separated  Intimate Partner Violence: Not At Risk   Fear of Current or Ex-Partner: No   Emotionally Abused: No   Physically Abused: No   Sexually Abused: No     Allergies  Allergen Reactions   Thorazine [Chlorpromazine Hcl] Anaphylaxis   Acetaminophen Other (See Comments)    Makes pt dizzy   Aspirin Other (See Comments)    seizure   Aspirin-Acetaminophen-Caffeine Other (See Comments)    seizure   Nsaids Nausea And Vomiting   Other     Acidic foods   Penicillins Nausea And Vomiting    Has patient had a PCN reaction causing immediate rash, facial/tongue/throat swelling, SOB or lightheadedness with hypotension:Yes Has patient had a PCN reaction causing severe rash involving mucus membranes or skin necrosis:Yes Has patient had a PCN reaction that required hospitalization:Yes Has patient had a PCN reaction occurring within the last 10 years:No If all of the above answers are "NO", then may proceed with Cephalosporin use.    Tomato Rash     Outpatient Medications Prior to Visit  Medication Sig Dispense Refill   atorvastatin (LIPITOR) 10 MG tablet TAKE 1 TABLET BY MOUTH ONCE A DAY. 30 tablet 5   benztropine (COGENTIN) 1 MG tablet Take 1 tablet (1 mg total) by mouth daily. 90 tablet 2   blood glucose meter kit and supplies Dispense based on patient and insurance preference. Use to test glucose once daily. (FOR ICD10, E11.9). 1 each 0   cycloSPORINE (RESTASIS) 0.05 % ophthalmic emulsion Place 1 drop into  both eyes 2 (two) times daily.     dicyclomine (BENTYL) 10 MG capsule Take 1 capsule (10 mg total) by mouth 3 (three) times daily before meals. As needed for loose stool and belly cramping. 90 capsule 5   docusate sodium (COLACE) 100 MG capsule TAKE 1 CAPSULE BY MOUTH TWICE DAILY 60 capsule 5   lamoTRIgine (LAMICTAL) 100 MG tablet Take 1 tablet (100 mg total) by mouth 2 (two) times daily. 180 tablet 2   loperamide (IMODIUM) 2 MG capsule Take by mouth 2 (two) times daily.     mupirocin ointment (BACTROBAN) 2 % SMARTSIG:1 Application Topical 2-3 Times Daily     ondansetron (ZOFRAN) 4 MG tablet Take 1 tablet (4 mg total) by mouth every 6 (six) hours as needed for nausea. 20 tablet 0   oxyCODONE (OXY IR/ROXICODONE) 5 MG immediate release tablet Take by mouth.     paliperidone (INVEGA) 9 MG 24 hr tablet Take 1 tablet (9 mg total) by mouth daily. 90 tablet 2   polyethylene glycol (MIRALAX / GLYCOLAX) 17 g packet Take 17 g by mouth daily as needed for moderate constipation.     potassium chloride (KLOR-CON) 10 MEQ tablet TAKE 2 TABLETS (20MEQ) BY MOUTH TWICE DAILY WITH FOOD 120 tablet 0   propranolol (INDERAL) 10 MG tablet Take 1 tablet (10 mg total) by mouth 3 (three) times daily. 90 tablet 0   RABEprazole (ACIPHEX) 20 MG tablet Take 1 tablet (20 mg total) by mouth 2 (two) times daily before a meal. 180 tablet 3   senna (SENNA-TABS) 8.6 MG tablet TAKE 1 TABLET BY MOUTH 2 TIMES A DAY (Patient taking differently: As needed constipation) 60 tablet 5   sertraline (ZOLOFT) 100 MG tablet Take 1 tablet (100 mg total) by mouth daily. 90 tablet 2   torsemide (DEMADEX) 20 MG tablet TAKE (1) TABLET BY MOUTH EACH MORNING. [place]  tablet 5   traZODone (DESYREL) 100 MG tablet TAKE 2 TABLETS(200MG) BY MOUTH AT BEDTIME. 180 tablet 2   No facility-administered medications prior to visit.    Review of Systems  Constitutional:  Negative for chills, fever, malaise/fatigue and weight loss.  HENT:  Negative for hearing loss,  sore throat and tinnitus.   Eyes:  Negative for blurred vision and double vision.  Respiratory:  Negative for cough, hemoptysis, sputum production, shortness of breath, wheezing and stridor.   Cardiovascular:  Negative for chest pain, palpitations, orthopnea, leg swelling and PND.  Gastrointestinal:  Negative for abdominal pain, constipation, diarrhea, heartburn, nausea and vomiting.  Genitourinary:  Negative for dysuria, hematuria and urgency.  Musculoskeletal:  Negative for joint pain and myalgias.  Skin:  Negative for itching and rash.  Neurological:  Negative for dizziness, tingling, weakness and headaches.  Endo/Heme/Allergies:  Negative for environmental allergies. Does not bruise/bleed easily.  Psychiatric/Behavioral:  Negative for depression. The patient is not nervous/anxious and does not have insomnia.   All other systems reviewed and are negative.   Objective:  Physical Exam Vitals reviewed.  Constitutional:      General: She is not in acute distress.    Appearance: She is well-developed. She is obese.  HENT:     Head: Normocephalic and atraumatic.  Eyes:     General: No scleral icterus.    Conjunctiva/sclera: Conjunctivae normal.     Pupils: Pupils are equal, round, and reactive to light.  Neck:     Vascular: No JVD.     Trachea: No tracheal deviation.  Cardiovascular:     Rate and Rhythm: Normal rate and regular rhythm.     Heart sounds: Normal heart sounds. No murmur heard. Pulmonary:     Effort: Pulmonary effort is normal. No tachypnea, accessory muscle usage or respiratory distress.     Breath sounds: Normal breath sounds. No stridor. No wheezing, rhonchi or rales.  Abdominal:     General: Bowel sounds are normal. There is no distension.     Palpations: Abdomen is soft.     Tenderness: There is no abdominal tenderness.  Musculoskeletal:        General: No tenderness.     Cervical back: Neck supple.  Lymphadenopathy:     Cervical: No cervical adenopathy.   Skin:    General: Skin is warm and dry.     Capillary Refill: Capillary refill takes less than 2 seconds.     Findings: No rash.  Neurological:     Mental Status: She is alert and oriented to person, place, and time.  Psychiatric:        Behavior: Behavior normal.     Vitals:   04/18/21 1034  BP: 110/64  Pulse: 66  Temp: 98.2 F (36.8 C)  TempSrc: Oral  SpO2: 98%  Weight: 219 lb 12.8 oz (99.7 kg)  Height: 5' 10.5" (1.791 m)   98% on RA BMI Readings from Last 3 Encounters:  04/18/21 31.09 kg/m  04/08/21 30.84 kg/m  03/04/21 29.99 kg/m   Wt Readings from Last 3 Encounters:  04/18/21 219 lb 12.8 oz (99.7 kg)  04/08/21 218 lb (98.9 kg)  03/04/21 209 lb (94.8 kg)     CBC    Component Value Date/Time   WBC 4.1 03/03/2021 1101   WBC 7.9 11/07/2020 0354   RBC 4.52 03/03/2021 1101   RBC 2.92 (L) 11/07/2020 0354   HGB 12.8 03/03/2021 1101   HCT 37.9 03/03/2021 1101   PLT 184 03/03/2021 1101  MCV 84 03/03/2021 1101   MCH 28.3 03/03/2021 1101   MCH 30.5 11/07/2020 0354   MCHC 33.8 03/03/2021 1101   MCHC 33.0 11/07/2020 0354   RDW 14.6 03/03/2021 1101   LYMPHSABS 1.6 03/03/2021 1101   MONOABS 0.8 03/22/2019 0130   EOSABS 0.0 03/03/2021 1101   BASOSABS 0.0 03/03/2021 1101     Chest Imaging: 03/04/2021 CT chest: Patient with right middle lobe pulmonary nodule slowly enlarging over time could represent indolent neoplasm it is macrolobulated associated preclosed to the vessels.  There was a concern whether or not this could be a AVM.  Does not appear that she has had a CTA.  04/01/2021: Nuclear medicine pet imaging with PET avid uptake on the right heart border adjacent to the same area as the nodule.  There could be some CT PET scan divergence on the overlay.  The hypermetabolic area could represent where the nodules located at does potentially appear this way. May be dealing with a indolent neoplasm. The patient's images have been independently reviewed by me.     Pulmonary Functions Testing Results: No flowsheet data found.  FeNO:   Pathology:   Echocardiogram:   Heart Catheterization:     Assessment & Plan:     ICD-10-CM   1. Lung nodule  R91.1 Ambulatory referral to Pulmonology    CT Angio Chest W/Cm &/Or Wo Cm    Procedural/ Surgical Case Request: VIDEO BRONCHOSCOPY WITH ENDOBRONCHIAL NAVIGATION    2. Obstructive sleep apnea syndrome  G47.33     3. Smoker  F17.200     4. Tobacco use  Z72.0       Discussion:  This is a 60 year old female, obese, history of OSA, tobacco abuse.  Has a nodule that has been followed for some time by thoracic surgery.  Patient recently saw Dr. Kipp Brood had a nuclear medicine PET scan.  We reviewed the results as stated above.  I am unsure if this nodule is hypermetabolic or not.  Not really sure why there is a significant amount of uptake on the right heart border.  We could be dealing with a little bit of CT overlay body divergence and the nodule may be actually hypermetabolic.  Either way the nodule has slowly been enlarging over time.  Indolent neoplasm has yet to be excluded.  Other options could include a pulmonary AVM she does not have a CTA.  Plan: Start with CTA of the chest to rule out pulmonary AVM. If this is negative for pulmonary AVM I think consideration for robotic assisted bronchoscopy and tissue sampling would be appropriate. Patient is agreeable to this plan. We discussed the risk benefits and alternatives today in the office. One of the patient's county case workers are also present today. We will plan for bronchoscopy at the beginning of November.  They have a few things that they need to work out with scheduling over the next couple of weeks. Therefore tentative bronchoscopy date will be 05/17/2021.    Current Outpatient Medications:    atorvastatin (LIPITOR) 10 MG tablet, TAKE 1 TABLET BY MOUTH ONCE A DAY., Disp: 30 tablet, Rfl: 5   benztropine (COGENTIN) 1 MG tablet, Take 1  tablet (1 mg total) by mouth daily., Disp: 90 tablet, Rfl: 2   blood glucose meter kit and supplies, Dispense based on patient and insurance preference. Use to test glucose once daily. (FOR ICD10, E11.9)., Disp: 1 each, Rfl: 0   cycloSPORINE (RESTASIS) 0.05 % ophthalmic emulsion, Place 1 drop into both  eyes 2 (two) times daily., Disp: , Rfl:    dicyclomine (BENTYL) 10 MG capsule, Take 1 capsule (10 mg total) by mouth 3 (three) times daily before meals. As needed for loose stool and belly cramping., Disp: 90 capsule, Rfl: 5   docusate sodium (COLACE) 100 MG capsule, TAKE 1 CAPSULE BY MOUTH TWICE DAILY, Disp: 60 capsule, Rfl: 5   lamoTRIgine (LAMICTAL) 100 MG tablet, Take 1 tablet (100 mg total) by mouth 2 (two) times daily., Disp: 180 tablet, Rfl: 2   loperamide (IMODIUM) 2 MG capsule, Take by mouth 2 (two) times daily., Disp: , Rfl:    mupirocin ointment (BACTROBAN) 2 %, SMARTSIG:1 Application Topical 2-3 Times Daily, Disp: , Rfl:    ondansetron (ZOFRAN) 4 MG tablet, Take 1 tablet (4 mg total) by mouth every 6 (six) hours as needed for nausea., Disp: 20 tablet, Rfl: 0   oxyCODONE (OXY IR/ROXICODONE) 5 MG immediate release tablet, Take by mouth., Disp: , Rfl:    paliperidone (INVEGA) 9 MG 24 hr tablet, Take 1 tablet (9 mg total) by mouth daily., Disp: 90 tablet, Rfl: 2   polyethylene glycol (MIRALAX / GLYCOLAX) 17 g packet, Take 17 g by mouth daily as needed for moderate constipation., Disp: , Rfl:    potassium chloride (KLOR-CON) 10 MEQ tablet, TAKE 2 TABLETS (20MEQ) BY MOUTH TWICE DAILY WITH FOOD, Disp: 120 tablet, Rfl: 0   propranolol (INDERAL) 10 MG tablet, Take 1 tablet (10 mg total) by mouth 3 (three) times daily., Disp: 90 tablet, Rfl: 0   RABEprazole (ACIPHEX) 20 MG tablet, Take 1 tablet (20 mg total) by mouth 2 (two) times daily before a meal., Disp: 180 tablet, Rfl: 3   senna (SENNA-TABS) 8.6 MG tablet, TAKE 1 TABLET BY MOUTH 2 TIMES A DAY (Patient taking differently: As needed constipation),  Disp: 60 tablet, Rfl: 5   sertraline (ZOLOFT) 100 MG tablet, Take 1 tablet (100 mg total) by mouth daily., Disp: 90 tablet, Rfl: 2   torsemide (DEMADEX) 20 MG tablet, TAKE (1) TABLET BY MOUTH EACH MORNING., Disp: 30 tablet, Rfl: 5   traZODone (DESYREL) 100 MG tablet, TAKE 2 TABLETS(200MG) BY MOUTH AT BEDTIME., Disp: 180 tablet, Rfl: 2  I spent 62 minutes dedicated to the care of this patient on the date of this encounter to include pre-visit review of records, face-to-face time with the patient discussing conditions above, post visit ordering of testing, clinical documentation with the electronic health record, making appropriate referrals as documented, and communicating necessary findings to members of the patients care team.   Garner Nash, DO [facility] 04/18/2021 12:08 PM

## 2021-04-19 ENCOUNTER — Encounter: Payer: Self-pay | Admitting: Pulmonary Disease

## 2021-04-19 DIAGNOSIS — M170 Bilateral primary osteoarthritis of knee: Secondary | ICD-10-CM | POA: Diagnosis not present

## 2021-04-19 DIAGNOSIS — F17200 Nicotine dependence, unspecified, uncomplicated: Secondary | ICD-10-CM | POA: Diagnosis not present

## 2021-04-19 DIAGNOSIS — R270 Ataxia, unspecified: Secondary | ICD-10-CM | POA: Diagnosis not present

## 2021-04-20 ENCOUNTER — Telehealth: Payer: Self-pay | Admitting: Pulmonary Disease

## 2021-04-20 DIAGNOSIS — F17200 Nicotine dependence, unspecified, uncomplicated: Secondary | ICD-10-CM | POA: Diagnosis not present

## 2021-04-20 DIAGNOSIS — R270 Ataxia, unspecified: Secondary | ICD-10-CM | POA: Diagnosis not present

## 2021-04-20 DIAGNOSIS — M170 Bilateral primary osteoarthritis of knee: Secondary | ICD-10-CM | POA: Diagnosis not present

## 2021-04-20 NOTE — Telephone Encounter (Signed)
Call made to patient, confirmed DOB. Patient states 530am is way too early for her to be up. I asked if it was too early for her of her case worker reita. She states she going with whatever the case worker said. I made her aware I would let her provider know.   BI please advise patient requesting a later time for Bronch on 11/8. Thanks :) Patient c/o having to be at hospital at 530 am.

## 2021-04-21 ENCOUNTER — Other Ambulatory Visit: Payer: Self-pay

## 2021-04-21 ENCOUNTER — Telehealth: Payer: Self-pay | Admitting: Internal Medicine

## 2021-04-21 DIAGNOSIS — M170 Bilateral primary osteoarthritis of knee: Secondary | ICD-10-CM | POA: Diagnosis not present

## 2021-04-21 DIAGNOSIS — F17200 Nicotine dependence, unspecified, uncomplicated: Secondary | ICD-10-CM | POA: Diagnosis not present

## 2021-04-21 DIAGNOSIS — R270 Ataxia, unspecified: Secondary | ICD-10-CM | POA: Diagnosis not present

## 2021-04-21 NOTE — Telephone Encounter (Signed)
PATIENT CALLED AND NEEDS RECTAL CREAM FOR ITCHING SENT TO HER PHARMACY

## 2021-04-21 NOTE — Progress Notes (Signed)
error 

## 2021-04-21 NOTE — Telephone Encounter (Signed)
Pt needs a refill of the hemorrhoid cream, add lidocaine. [facility]) Dispense 30 gr. Sig: apply a pea size amount to the anorectum x 3 a day PRN.

## 2021-04-21 NOTE — Telephone Encounter (Signed)
Garner Nash, DO  [place], [place], LPN; [facility] Triage Pool; Love, Lujean Rave, RN; [facility] Pool Caller: Unspecified (Yesterday,  2:52 PM)  If we can rearrange patients that day.  I am happy to do her in the mid morning if there is issues related to transportation.  Otherwise unfortunately bronchoscopies start at 7:30 AM in the morning.   If there is an option to move her later in the day I am okay with that.   Please call and let patient know if there is options for a different slot.   Garner Nash, DO  [facility]  04/20/2021 5:13 PM    [place]- please let us know if this pt can do another time this day

## 2021-04-22 ENCOUNTER — Telehealth: Payer: Self-pay | Admitting: Pulmonary Disease

## 2021-04-22 DIAGNOSIS — M170 Bilateral primary osteoarthritis of knee: Secondary | ICD-10-CM | POA: Diagnosis not present

## 2021-04-22 DIAGNOSIS — R270 Ataxia, unspecified: Secondary | ICD-10-CM | POA: Diagnosis not present

## 2021-04-22 DIAGNOSIS — F17200 Nicotine dependence, unspecified, uncomplicated: Secondary | ICD-10-CM | POA: Diagnosis not present

## 2021-04-22 NOTE — Telephone Encounter (Signed)
Rx called in to [place].

## 2021-04-22 NOTE — Telephone Encounter (Signed)
error 

## 2021-04-25 ENCOUNTER — Other Ambulatory Visit: Payer: Self-pay | Admitting: Gastroenterology

## 2021-04-25 DIAGNOSIS — F17200 Nicotine dependence, unspecified, uncomplicated: Secondary | ICD-10-CM | POA: Diagnosis not present

## 2021-04-25 DIAGNOSIS — R270 Ataxia, unspecified: Secondary | ICD-10-CM | POA: Diagnosis not present

## 2021-04-25 DIAGNOSIS — M170 Bilateral primary osteoarthritis of knee: Secondary | ICD-10-CM | POA: Diagnosis not present

## 2021-04-25 NOTE — Telephone Encounter (Signed)
Phoned and advised the pt of her Rx being phoned to [facility]

## 2021-04-26 ENCOUNTER — Other Ambulatory Visit: Payer: Self-pay

## 2021-04-26 ENCOUNTER — Telehealth: Payer: Self-pay

## 2021-04-26 ENCOUNTER — Ambulatory Visit (INDEPENDENT_AMBULATORY_CARE_PROVIDER_SITE_OTHER): Payer: Medicare Other

## 2021-04-26 VITALS — Ht 70.5 in | Wt 219.0 lb

## 2021-04-26 DIAGNOSIS — Z0000 Encounter for general adult medical examination without abnormal findings: Secondary | ICD-10-CM | POA: Diagnosis not present

## 2021-04-26 DIAGNOSIS — M170 Bilateral primary osteoarthritis of knee: Secondary | ICD-10-CM | POA: Diagnosis not present

## 2021-04-26 DIAGNOSIS — R270 Ataxia, unspecified: Secondary | ICD-10-CM | POA: Diagnosis not present

## 2021-04-26 DIAGNOSIS — F17200 Nicotine dependence, unspecified, uncomplicated: Secondary | ICD-10-CM | POA: Diagnosis not present

## 2021-04-26 NOTE — Patient Instructions (Signed)
[name] , Thank you for taking time to come for your Medicare Wellness Visit. I appreciate your ongoing commitment to your health goals. Please review the following plan we discussed and let me know if I can assist you in the future.   Screening recommendations/referrals: Colonoscopy: Done 09/07/2016 Repeat in 10 years  Mammogram: Done 01/26/2020 Repeat annually  Bone Density: Not due until age 58. Recommended yearly ophthalmology/optometry visit for glaucoma screening and checkup Recommended yearly dental visit for hygiene and checkup  Vaccinations: Influenza vaccine: Done 05/31/2020 Repeat annually  Pneumococcal vaccine: Done 03/03/2014, 2nd dose due Tdap vaccine: Due Repeat in 10 years  Shingles vaccine: Shingrix discussed. Please contact your pharmacy for coverage information.    Covid-19: Done 09/08/2019 and 10/08/2019  Advanced directives: Advance directive discussed with you today. Even though you declined this today, please call our office should you change your mind, and we can give you the proper paperwork for you to fill out.   Conditions/risks identified: Aim for 30 minutes of exercise or  walking each day, drink 6-8 glasses of water and eat lots of fruits and vegetables.   Next appointment: Follow up in one year for your annual wellness visit. 2023.  Preventive Care 40-64 Years, Female Preventive care refers to lifestyle choices and visits with your health care provider that can promote health and wellness. What does preventive care include? A yearly physical exam. This is also called an annual well check. Dental exams once or twice a year. Routine eye exams. Ask your health care provider how often you should have your eyes checked. Personal lifestyle choices, including: Daily care of your teeth and gums. Regular physical activity. Eating a healthy diet. Avoiding tobacco and drug use. Limiting alcohol use. Practicing safe sex. Taking low-dose aspirin daily starting  at age 20. Taking vitamin and mineral supplements as recommended by your health care provider. What happens during an annual well check? The services and screenings done by your health care provider during your annual well check will depend on your age, overall health, lifestyle risk factors, and family history of disease. Counseling  Your health care provider may ask you questions about your: Alcohol use. Tobacco use. Drug use. Emotional well-being. Home and relationship well-being. Sexual activity. Eating habits. Work and work Statistician. Method of birth control. Menstrual cycle. Pregnancy history. Screening  You may have the following tests or measurements: Height, weight, and BMI. Blood pressure. Lipid and cholesterol levels. These may be checked every 5 years, or more frequently if you are over 27 years old. Skin check. Lung cancer screening. You may have this screening every year starting at age 39 if you have a 30-pack-year history of smoking and currently smoke or have quit within the past 15 years. Fecal occult blood test (FOBT) of the stool. You may have this test every year starting at age 19. Flexible sigmoidoscopy or colonoscopy. You may have a sigmoidoscopy every 5 years or a colonoscopy every 10 years starting at age 62. Hepatitis C blood test. Hepatitis B blood test. Sexually transmitted disease (STD) testing. Diabetes screening. This is done by checking your blood sugar (glucose) after you have not eaten for a while (fasting). You may have this done every 1-3 years. Mammogram. This may be done every 1-2 years. Talk to your health care provider about when you should start having regular mammograms. This may depend on whether you have a family history of breast cancer. BRCA-related cancer screening. This may be done if you have a family  history of breast, ovarian, tubal, or peritoneal cancers. Pelvic exam and Pap test. This may be done every 3 years starting at age 79.  Starting at age 68, this may be done every 5 years if you have a Pap test in combination with an HPV test. Bone density scan. This is done to screen for osteoporosis. You may have this scan if you are at high risk for osteoporosis. Discuss your test results, treatment options, and if necessary, the need for more tests with your health care provider. Vaccines  Your health care provider may recommend certain vaccines, such as: Influenza vaccine. This is recommended every year. Tetanus, diphtheria, and acellular pertussis (Tdap, Td) vaccine. You may need a Td booster every 10 years. Zoster vaccine. You may need this after age 45. Pneumococcal 13-valent conjugate (PCV13) vaccine. You may need this if you have certain conditions and were not previously vaccinated. Pneumococcal polysaccharide (PPSV23) vaccine. You may need one or two doses if you smoke cigarettes or if you have certain conditions. Talk to your health care provider about which screenings and vaccines you need and how often you need them. This information is not intended to replace advice given to you by your health care provider. Make sure you discuss any questions you have with your health care provider. Document Released: 07/23/2015 Document Revised: 03/15/2016 Document Reviewed: 04/27/2015 Elsevier Interactive Patient Education  2017 [place] Prevention in the Home Falls can cause injuries. They can happen to people of all ages. There are many things you can do to make your home safe and to help prevent falls. What can I do on the outside of my home? Regularly fix the edges of walkways and driveways and fix any cracks. Remove anything that might make you trip as you walk through a door, such as a raised step or threshold. Trim any bushes or trees on the path to your home. Use bright outdoor lighting. Clear any walking paths of anything that might make someone trip, such as rocks or tools. Regularly check to see if  handrails are loose or broken. Make sure that both sides of any steps have handrails. Any raised decks and porches should have guardrails on the edges. Have any leaves, snow, or ice cleared regularly. Use sand or salt on walking paths during winter. Clean up any spills in your garage right away. This includes oil or grease spills. What can I do in the bathroom? Use night lights. Install grab bars by the toilet and in the tub and shower. Do not use towel bars as grab bars. Use non-skid mats or decals in the tub or shower. If you need to sit down in the shower, use a plastic, non-slip stool. Keep the floor dry. Clean up any water that spills on the floor as soon as it happens. Remove soap buildup in the tub or shower regularly. Attach bath mats securely with double-sided non-slip rug tape. Do not have throw rugs and other things on the floor that can make you trip. What can I do in the bedroom? Use night lights. Make sure that you have a light by your bed that is easy to reach. Do not use any sheets or blankets that are too big for your bed. They should not hang down onto the floor. Have a firm chair that has side arms. You can use this for support while you get dressed. Do not have throw rugs and other things on the floor that can make you  trip. What can I do in the kitchen? Clean up any spills right away. Avoid walking on wet floors. Keep items that you use a lot in easy-to-reach places. If you need to reach something above you, use a strong step stool that has a grab bar. Keep electrical cords out of the way. Do not use floor polish or wax that makes floors slippery. If you must use wax, use non-skid floor wax. Do not have throw rugs and other things on the floor that can make you trip. What can I do with my stairs? Do not leave any items on the stairs. Make sure that there are handrails on both sides of the stairs and use them. Fix handrails that are broken or loose. Make sure that  handrails are as long as the stairways. Check any carpeting to make sure that it is firmly attached to the stairs. Fix any carpet that is loose or worn. Avoid having throw rugs at the top or bottom of the stairs. If you do have throw rugs, attach them to the floor with carpet tape. Make sure that you have a light switch at the top of the stairs and the bottom of the stairs. If you do not have them, ask someone to add them for you. What else can I do to help prevent falls? Wear shoes that: Do not have high heels. Have rubber bottoms. Are comfortable and fit you well. Are closed at the toe. Do not wear sandals. If you use a stepladder: Make sure that it is fully opened. Do not climb a closed stepladder. Make sure that both sides of the stepladder are locked into place. Ask someone to hold it for you, if possible. Clearly mark and make sure that you can see: Any grab bars or handrails. First and last steps. Where the edge of each step is. Use tools that help you move around (mobility aids) if they are needed. These include: Canes. Walkers. Scooters. Crutches. Turn on the lights when you go into a dark area. Replace any light bulbs as soon as they burn out. Set up your furniture so you have a clear path. Avoid moving your furniture around. If any of your floors are uneven, fix them. If there are any pets around you, be aware of where they are. Review your medicines with your doctor. Some medicines can make you feel dizzy. This can increase your chance of falling. Ask your doctor what other things that you can do to help prevent falls. This information is not intended to replace advice given to you by your health care provider. Make sure you discuss any questions you have with your health care provider. Document Released: 04/22/2009 Document Revised: 12/02/2015 Document Reviewed: 07/31/2014 Elsevier Interactive Patient Education  2017 [employer].

## 2021-04-26 NOTE — Telephone Encounter (Signed)
[facility] visit today. Pt request order for new shower chair. She would like order sent to [facility]. Thank you.

## 2021-04-26 NOTE — Progress Notes (Signed)
Subjective:   [name] is a 60 y.o. female who presents for an Initial Medicare Annual Wellness Visit. Virtual Visit via Telephone Note  I connected with  [name] on 04/26/21 at  1:00 PM EDT by telephone and verified that I am speaking with the correct person using two identifiers.  Location: Patient: HOME Provider: [facility] Persons participating in the virtual visit: patient/Nurse Health Advisor   I discussed the limitations, risks, security and privacy concerns of performing an evaluation and management service by telephone and the availability of in person appointments. The patient expressed understanding and agreed to proceed.  Interactive audio and video telecommunications were attempted between this nurse and patient, however failed, due to patient having technical difficulties OR patient did not have access to video capability.  We continued and completed visit with audio only.  Some vital signs may be absent or patient reported.   Chriss Driver, LPN  Review of Systems     Cardiac Risk Factors include: hypertension;dyslipidemia;obesity (BMI >30kg/m2);sedentary lifestyle     Objective:    Today's Vitals   04/26/21 1314  Weight: 219 lb (99.3 kg)  Height: 5' 10.5" (1.791 m)   Body mass index is 30.98 kg/m.  Advanced Directives 04/26/2021 11/04/2020 10/25/2020 05/26/2020 01/15/2020 01/13/2020 08/14/2019  Does Patient Have a Medical Advance Directive? _0  No No  Does patient want to make changes to medical advance directive? - - - - - - -  Would patient like information on creating a medical advance directive? No - Patient declined No - Patient declined - - No - Patient declined No - Patient declined No - Patient declined  Pre-existing out of facility DNR order (yellow form or pink MOST form) - - - - - - -  Some encounter information is confidential and restricted. Go to Review Flowsheets activity to see all data.    Current Medications  (verified) Outpatient Encounter Medications as of 04/26/2021  Medication Sig   atorvastatin (LIPITOR) 10 MG tablet TAKE 1 TABLET BY MOUTH ONCE A DAY.   benztropine (COGENTIN) 1 MG tablet Take 1 tablet (1 mg total) by mouth daily.   blood glucose meter kit and supplies Dispense based on patient and insurance preference. Use to test glucose once daily. (FOR ICD10, E11.9).   cycloSPORINE (RESTASIS) 0.05 % ophthalmic emulsion Place 1 drop into both eyes 2 (two) times daily.   dicyclomine (BENTYL) 10 MG capsule Take 1 capsule (10 mg total) by mouth 3 (three) times daily before meals. As needed for loose stool and belly cramping.   docusate sodium (COLACE) 100 MG capsule TAKE 1 CAPSULE BY MOUTH TWICE DAILY   lamoTRIgine (LAMICTAL) 100 MG tablet Take 1 tablet (100 mg total) by mouth 2 (two) times daily.   loperamide (IMODIUM) 2 MG capsule Take by mouth 2 (two) times daily.   mupirocin ointment (BACTROBAN) 2 % SMARTSIG:1 Application Topical 2-3 Times Daily   ondansetron (ZOFRAN) 4 MG tablet Take 1 tablet (4 mg total) by mouth every 6 (six) hours as needed for nausea.   oxyCODONE (OXY IR/ROXICODONE) 5 MG immediate release tablet Take by mouth.   paliperidone (INVEGA) 9 MG 24 hr tablet Take 1 tablet (9 mg total) by mouth daily.   polyethylene glycol (MIRALAX / GLYCOLAX) 17 g packet Take 17 g by mouth daily as needed for moderate constipation.   potassium chloride (KLOR-CON) 10 MEQ tablet TAKE 2 TABLETS (20MEQ) BY MOUTH TWICE DAILY WITH FOOD   propranolol (  INDERAL) 10 MG tablet Take 1 tablet (10 mg total) by mouth 3 (three) times daily.   RABEprazole (ACIPHEX) 20 MG tablet Take 1 tablet (20 mg total) by mouth 2 (two) times daily before a meal.   senna (SENNA-TABS) 8.6 MG tablet TAKE 1 TABLET BY MOUTH 2 TIMES A DAY (Patient taking differently: As needed constipation)   sertraline (ZOLOFT) 100 MG tablet Take 1 tablet (100 mg total) by mouth daily.   torsemide (DEMADEX) 20 MG tablet TAKE (1) TABLET BY MOUTH  EACH MORNING.   traZODone (DESYREL) 100 MG tablet TAKE 2 TABLETS(200MG) BY MOUTH AT BEDTIME.   No facility-administered encounter medications on file as of 04/26/2021.    Allergies (verified) Thorazine [chlorpromazine hcl], Acetaminophen, Aspirin, Aspirin-acetaminophen-caffeine, Nsaids, Other, Penicillins, and Tomato   History: Past Medical History:  Diagnosis Date   Anxiety    Aortic atherosclerosis ([place]) 09/19/2020   Seen on CAT scan 2021   Arthritis    Depression    Diabetes mellitus    Diabetes mellitus, type II ([place])    Dyspnea    Dysrhythmia    GERD (gastroesophageal reflux disease)    Heart murmur    HTN (hypertension)    Hyperglycemia    IBS (irritable bowel syndrome)    Lung nodules    right, followed by PCP, PET 11/2011   Mental retardation    MI (myocardial infarction) ([place])    Migraines    Pneumonia    Sleep apnea    cpap    Stroke [facility])    Past Surgical History:  Procedure Laterality Date   ABDOMINAL HYSTERECTOMY     BIOPSY  01/15/2020   Procedure: BIOPSY;  Surgeon: Daneil Dolin, MD;  [facility];  Service: Endoscopy;;  gastric   COLONOSCOPY  11/2007   hyperplastic polyps, prior hx of adenomas    COLONOSCOPY  05/2010   incomplete due to poor prep, hyperplastic rectal polyp   COLONOSCOPY  05/05/2002   Dimunitive polyps in the rectum and left colon, cold    biopsied/removed.  Scattered few left-sided diverticula.  Regular colonic   mucosa appeared normal   COLONOSCOPY N/A 05/28/2013   Rourk: mulitple tubular adenomas removed. next tcs 05/2016   COLONOSCOPY WITH PROPOFOL N/A 09/07/2016   Dr. Gala Romney: For hyperplastic polyps removed. Next colonoscopy March 2023 given history of adenomatous colon polyps in the past.   ESOPHAGOGASTRODUODENOSCOPY  08/2007   moderate sized hiatal hernia   ESOPHAGOGASTRODUODENOSCOPY  05/2010   noncritical schatzki ring s/p 63F   ESOPHAGOGASTRODUODENOSCOPY (EGD) WITH ESOPHAGEAL DILATION N/A 02/06/2013   WCB:JSEGBT  esophagus-s/p dilation up to a 62 [place] size with [facility] dilators.  Hiatal hernia   ESOPHAGOGASTRODUODENOSCOPY (EGD) WITH PROPOFOL N/A 09/07/2016   Dr. Gala Romney: Normal, status post empiric dilation of the esophagus for history of dysphagia   ESOPHAGOGASTRODUODENOSCOPY (EGD) WITH PROPOFOL N/A 05/02/2019   Normal esophagus s/p dilation, normal stomach, normal duodenum   ESOPHAGOGASTRODUODENOSCOPY (EGD) WITH PROPOFOL N/A 01/15/2020   normal esophagus s/p dilation, gastric nodule s/p biopsy. This showed reactive gastropathy with H.pylori.    EXTERNAL EAR SURGERY     bilateral   FOOT SURGERY     GLAUCOMA SURGERY     KNEE ARTHROPLASTY Right 11/04/2020   Procedure: COMPUTER ASSISTED TOTAL KNEE ARTHROPLASTY;  Surgeon: Rod Can, MD;  Location: [facility];  Service: Orthopedics;  Laterality: Right;   MALONEY DILATION N/A 09/07/2016   Procedure: Venia Minks DILATION;  Surgeon: Daneil Dolin, MD;  [facility];  Service: Endoscopy;  Laterality: N/A;   MALONEY DILATION N/A 05/02/2019   Procedure: Venia Minks DILATION;  Surgeon: Daneil Dolin, MD;  [facility];  Service: Endoscopy;  Laterality: N/A;   MALONEY DILATION N/A 01/15/2020   Procedure: Venia Minks DILATION;  Surgeon: Daneil Dolin, MD;  [facility];  Service: Endoscopy;  Laterality: N/A;   POLYPECTOMY  09/07/2016   Procedure: POLYPECTOMY;  Surgeon: Daneil Dolin, MD;  [facility];  Service: Endoscopy;;  sigmoid colon x4   small bowel capsule  10/2007   normal   Family History  Problem Relation Age of Onset   Stroke Mother    Heart attack Father    Schizophrenia Other    Drug abuse Other    Alcohol abuse Other    Colon cancer Other        aunt   Obesity Other    COPD Other    Anxiety disorder Other    GER disease Other    Diabetes type II Other    Anxiety disorder Other    Depression Other    Depression Sister    Schizophrenia Sister    Liver disease Neg Hx    Inflammatory bowel disease Neg Hx     Social History   Socioeconomic History   Marital status: Divorced    Spouse name: Not on file   Number of children: Not on file   Years of education: Not on file   Highest education level: High school graduate  Occupational History   Occupation: disabled    Employer: UNEMPLOYED  Tobacco Use   Smoking status: Every Day    Packs/day: 0.50    Years: 40.00    Pack years: 20.00    Types: Cigarettes   Smokeless tobacco: Never   Tobacco comments:    Patient smokes quarter pack a day  Vaping Use   Vaping Use: Never used  Substance and Sexual Activity   Alcohol use: No   Drug use: No   Sexual activity: Not Currently    Partners: Male    Birth control/protection: Surgical    Comment: hyst  Other Topics Concern   Not on file  Social History Narrative   Not on file   Social Determinants of Health   Financial Resource Strain: Low Risk    Difficulty of Paying Living Expenses: Not hard at all  Food Insecurity: No Food Insecurity   Worried About Charity fundraiser in the Last Year: Never true   Ran Out of Food in the Last Year: Never true  Transportation Needs: No Transportation Needs   Lack of Transportation (Medical): No   Lack of Transportation (Non-Medical): No  Physical Activity: Sufficiently Active   Days of Exercise per Week: 5 days   Minutes of Exercise per Session: 40 min  Stress: No Stress Concern Present   Feeling of Stress : Only a little  Social Connections: Not on file    Tobacco Counseling Ready to quit: Not Answered Counseling given: Not Answered Tobacco comments: Patient smokes quarter pack a day   Clinical Intake:  Pre-visit preparation completed: Yes  Pain : No/denies pain     BMI - recorded: 30.98 Nutritional Status: BMI > 30  Obese Nutritional Risks: None Diabetes: No  How often do you need to have someone help you when you read instructions, pamphlets, or other written materials from your doctor or pharmacy?: 1 -  Never  Diabetic?NO  Interpreter Needed?: No  Information entered by :: MJ Ajani Rineer,  LPN   Activities of Daily Living In your present state of health, do you have any difficulty performing the following activities: 04/26/2021 11/04/2020  Hearing? N N  Vision? N N  Difficulty concentrating or making decisions? Tempie Donning  Walking or climbing stairs? Y Y  Dressing or bathing? N N  Doing errands, shopping? Y N  Preparing Food and eating ? N -  Using the Toilet? N -  In the past six months, have you accidently leaked urine? Y -  Do you have problems with loss of bowel control? Y -  Managing your Medications? Y -  Comment Pt has CNA and Case Manager that helps her. -  Managing your Finances? Y -  Housekeeping or managing your Housekeeping? Y -  Some recent data might be hidden    Patient Care Team: Kathyrn Drown, MD as PCP - General (Family Medicine) Gala Romney, [name], MD (Gastroenterology)  Indicate any recent Medical Services you may have received from other than [facility] providers in the past year (date may be approximate).     Assessment:   This is a routine wellness examination for Charlett Nose.  Hearing/Vision screen Hearing Screening - Comments:: No hearing issues.  Vision Screening - Comments:: Glasses. [facility].  Dietary issues and exercise activities discussed: Current Exercise Habits: Home exercise routine, Type of exercise: walking, Time (Minutes): 40, Frequency (Times/Week): 5, Weekly Exercise (Minutes/Week): 200, Intensity: Mild, Exercise limited by: cardiac condition(s);orthopedic condition(s)   Goals Addressed             This Visit's Progress    DIET - INCREASE WATER INTAKE         Depression Screen PHQ 2/9 Scores 04/26/2021 03/03/2021 03/03/2021 05/31/2020 04/19/2020 05/27/2019 03/26/2019  PHQ - 2 Score 1 4 0 _0 PHQ- 9 Score 1 15 - _1 -  Some encounter information is confidential and restricted. Go to Review Flowsheets activity to see all data.     Fall Risk Fall Risk  04/26/2021 03/03/2021 12/01/2020 04/19/2020 10/03/2019  Falls in the past year? _2 Comment - - - - -  Number falls in past yr: 0 0 _3 Injury with Fall? _4 Comment - - - hit back and head -  Risk for fall due to : History of fall(s);Impaired balance/gait;Impaired mobility No Fall Risks Impaired balance/gait - History of fall(s);Impaired mobility  Follow up Falls prevention discussed Falls evaluation completed Falls evaluation completed;Education provided - Education provided    FALL RISK PREVENTION PERTAINING TO THE HOME:  Any stairs in or around the home? No  If so, are there any without handrails? No  Home free of loose throw rugs in walkways, pet beds, electrical cords, etc? Yes  Adequate lighting in your home to reduce risk of falls? Yes   ASSISTIVE DEVICES UTILIZED TO PREVENT FALLS:  Life alert? No  Use of a cane, walker or w/c? Yes  Grab bars in the bathroom? Yes  Shower chair or bench in shower? Yes  Elevated toilet seat or a handicapped toilet? Yes   TIMED UP AND GO:  Was the test performed? No . Phone visit.   Cognitive Function:     6CIT Screen 04/26/2021  What Year? 0 points  What month? 0 points  What time? 0 points  Count back from 20 0 points  Months in reverse 0 points  Repeat phrase 0 points  Total Score 0  Immunizations Immunization History  Administered Date(s) Administered   Influenza Split 04/28/2013   Influenza,inj,Quad PF,6+ Mos 06/09/2015, 04/23/2017, 03/23/2019, 05/31/2020   Influenza-Unspecified 05/12/2014, 04/28/2016, 05/01/2018   Moderna Sars-Covid-2 Vaccination 09/08/2019, 10/08/2019   Pneumococcal Polysaccharide-23 03/03/2014    TDAP status: Due, Education has been provided regarding the importance of this vaccine. Advised may receive this vaccine at local pharmacy or Health Dept. Aware to provide a copy of the vaccination record if obtained from local pharmacy or Health Dept. Verbalized  acceptance and understanding.  Flu Vaccine status: Due, Education has been provided regarding the importance of this vaccine. Advised may receive this vaccine at local pharmacy or Health Dept. Aware to provide a copy of the vaccination record if obtained from local pharmacy or Health Dept. Verbalized acceptance and understanding.  Pneumococcal vaccine status: Due, Education has been provided regarding the importance of this vaccine. Advised may receive this vaccine at local pharmacy or Health Dept. Aware to provide a copy of the vaccination record if obtained from local pharmacy or Health Dept. Verbalized acceptance and understanding.  Covid-19 vaccine status: Information provided on how to obtain vaccines.   Qualifies for Shingles Vaccine? Yes   Zostavax completed No   Shingrix Completed?: No.    Education has been provided regarding the importance of this vaccine. Patient has been advised to call insurance company to determine out of pocket expense if they have not yet received this vaccine. Advised may also receive vaccine at local pharmacy or Health Dept. Verbalized acceptance and understanding.  Screening Tests Health Maintenance  Topic Date Due   TETANUS/TDAP  Never done   Zoster Vaccines- Shingrix (1 of 2) Never done   OPHTHALMOLOGY EXAM  04/27/2018   COVID-19 Vaccine (3 - Moderna risk series) 11/05/2019   INFLUENZA VACCINE  02/07/2021   FOOT EXAM  05/31/2021   URINE MICROALBUMIN  07/16/2021   HEMOGLOBIN A1C  09/03/2021   MAMMOGRAM  01/28/2023   COLONOSCOPY (Pts 45-32yr Insurance coverage will need to be confirmed)  09/08/2026   Hepatitis C Screening  Completed   HIV Screening  Completed   HPV VACCINES  Aged Out    Health Maintenance  Health Maintenance Due  Topic Date Due   TETANUS/TDAP  Never done   Zoster Vaccines- Shingrix (1 of 2) Never done   OPHTHALMOLOGY EXAM  04/27/2018   COVID-19 Vaccine (3 - Moderna risk series) 11/05/2019   INFLUENZA VACCINE  02/07/2021     Colorectal cancer screening: Type of screening: Colonoscopy. Completed 09/07/2016. Repeat every 10 years  Mammogram status: Completed 01/27/2021. Repeat every year  Bone Density status: Ordered NOT DUE UNTIL AGE 60 Pt provided with contact info and advised to call to schedule appt.  Lung Cancer Screening: (Low Dose CT Chest recommended if Age 58-80years, 30 pack-year currently smoking OR have quit w/in 15years.) does not qualify.    Additional Screening:  Hepatitis C Screening: does qualify; Completed 03/25/2019  Vision Screening: Recommended annual ophthalmology exams for early detection of glaucoma and other disorders of the eye. Is the patient up to date with their annual eye exam?  Yes  Who is the provider or what is the name of the office in which the patient attends annual eye exams? [facility] If pt is not established with a provider, would they like to be referred to a provider to establish care? No .   Dental Screening: Recommended annual dental exams for proper oral hygiene  Community Resource Referral / Chronic Care Management: CRR required this  visit?  No   CCM required this visit?  No      Plan:     I have personally reviewed and noted the following in the patient's chart:   Medical and social history Use of alcohol, tobacco or illicit drugs  Current medications and supplements including opioid prescriptions. Patient is not currently taking opioid prescriptions. Functional ability and status Nutritional status Physical activity Advanced directives List of other physicians Hospitalizations, surgeries, and ER visits in previous 12 months Vitals Screenings to include cognitive, depression, and falls Referrals and appointments  In addition, I have reviewed and discussed with patient certain preventive protocols, quality metrics, and best practice recommendations. A written personalized care plan for preventive services as well as general preventive  health recommendations were provided to patient.     Chriss Driver, LPN   22/24/1146   Nurse Notes: Pt requests order for new shower chair. Would like order sent to [facility]. Pt lives on own but does have a CNA and a Case Manager that follows her.

## 2021-04-26 NOTE — Telephone Encounter (Signed)
Script printed out and placed on provider door for signature. Will fax to pharmacy once signed.

## 2021-04-26 NOTE — Telephone Encounter (Signed)
May have an order for a shower chair diagnosis ataxia, weakness

## 2021-04-26 NOTE — Telephone Encounter (Signed)
Duplicate message. Please see encounter from 10/12. Will close this encounter.

## 2021-04-27 DIAGNOSIS — F17200 Nicotine dependence, unspecified, uncomplicated: Secondary | ICD-10-CM | POA: Diagnosis not present

## 2021-04-27 DIAGNOSIS — M170 Bilateral primary osteoarthritis of knee: Secondary | ICD-10-CM | POA: Diagnosis not present

## 2021-04-27 DIAGNOSIS — R270 Ataxia, unspecified: Secondary | ICD-10-CM | POA: Diagnosis not present

## 2021-04-27 NOTE — Telephone Encounter (Signed)
Script faxed to [facility]

## 2021-04-27 NOTE — Telephone Encounter (Signed)
Signed  thankyou

## 2021-04-28 DIAGNOSIS — R270 Ataxia, unspecified: Secondary | ICD-10-CM | POA: Diagnosis not present

## 2021-04-28 DIAGNOSIS — M170 Bilateral primary osteoarthritis of knee: Secondary | ICD-10-CM | POA: Diagnosis not present

## 2021-04-28 DIAGNOSIS — F17200 Nicotine dependence, unspecified, uncomplicated: Secondary | ICD-10-CM | POA: Diagnosis not present

## 2021-04-28 NOTE — Telephone Encounter (Signed)
[name].    Did you ever get this worked out with Larene Beach &/or the patient?

## 2021-04-29 ENCOUNTER — Ambulatory Visit: Payer: Medicare Other | Admitting: Gastroenterology

## 2021-04-29 DIAGNOSIS — R270 Ataxia, unspecified: Secondary | ICD-10-CM | POA: Diagnosis not present

## 2021-04-29 DIAGNOSIS — M170 Bilateral primary osteoarthritis of knee: Secondary | ICD-10-CM | POA: Diagnosis not present

## 2021-04-29 DIAGNOSIS — F17200 Nicotine dependence, unspecified, uncomplicated: Secondary | ICD-10-CM | POA: Diagnosis not present

## 2021-05-02 ENCOUNTER — Telehealth: Payer: Self-pay | Admitting: Pulmonary Disease

## 2021-05-02 DIAGNOSIS — R270 Ataxia, unspecified: Secondary | ICD-10-CM | POA: Diagnosis not present

## 2021-05-02 DIAGNOSIS — F17200 Nicotine dependence, unspecified, uncomplicated: Secondary | ICD-10-CM | POA: Diagnosis not present

## 2021-05-02 DIAGNOSIS — M170 Bilateral primary osteoarthritis of knee: Secondary | ICD-10-CM | POA: Diagnosis not present

## 2021-05-02 NOTE — Telephone Encounter (Signed)
Patient scheduled 04/14/21 for CTA, Covid test 05/13/21, and bronchoscopy 05/17/21. Called and spoke with social worker [name], with [facility].  [name] stated Patient is currently sick with flu like symptoms and can not use transportation, because of illness.  [name] stated CTA, Covid test, and bronchoscopy will need rescheduling. [name] requested appointments in late morning or afternoons. [name] requested appointment times along with Patient, for scheduling. [name] contact # [phone], ext [phone].   Message routed to [facility]

## 2021-05-03 DIAGNOSIS — R270 Ataxia, unspecified: Secondary | ICD-10-CM | POA: Diagnosis not present

## 2021-05-03 DIAGNOSIS — M170 Bilateral primary osteoarthritis of knee: Secondary | ICD-10-CM | POA: Diagnosis not present

## 2021-05-03 DIAGNOSIS — F17200 Nicotine dependence, unspecified, uncomplicated: Secondary | ICD-10-CM | POA: Diagnosis not present

## 2021-05-04 ENCOUNTER — Inpatient Hospital Stay: Admission: RE | Admit: 2021-05-04 | Payer: Medicare Other | Source: Ambulatory Visit

## 2021-05-04 DIAGNOSIS — F17200 Nicotine dependence, unspecified, uncomplicated: Secondary | ICD-10-CM | POA: Diagnosis not present

## 2021-05-04 DIAGNOSIS — M170 Bilateral primary osteoarthritis of knee: Secondary | ICD-10-CM | POA: Diagnosis not present

## 2021-05-04 DIAGNOSIS — R270 Ataxia, unspecified: Secondary | ICD-10-CM | POA: Diagnosis not present

## 2021-05-05 DIAGNOSIS — R270 Ataxia, unspecified: Secondary | ICD-10-CM | POA: Diagnosis not present

## 2021-05-05 DIAGNOSIS — F17200 Nicotine dependence, unspecified, uncomplicated: Secondary | ICD-10-CM | POA: Diagnosis not present

## 2021-05-05 DIAGNOSIS — M170 Bilateral primary osteoarthritis of knee: Secondary | ICD-10-CM | POA: Diagnosis not present

## 2021-05-05 NOTE — Telephone Encounter (Signed)
I have sent a message to [facility] on this

## 2021-05-05 NOTE — Telephone Encounter (Signed)
[facility] for [facility] [employer]

## 2021-05-06 DIAGNOSIS — M170 Bilateral primary osteoarthritis of knee: Secondary | ICD-10-CM | POA: Diagnosis not present

## 2021-05-06 DIAGNOSIS — F17200 Nicotine dependence, unspecified, uncomplicated: Secondary | ICD-10-CM | POA: Diagnosis not present

## 2021-05-06 DIAGNOSIS — R270 Ataxia, unspecified: Secondary | ICD-10-CM | POA: Diagnosis not present

## 2021-05-06 NOTE — Telephone Encounter (Signed)
I have closed this message and added to the other one

## 2021-05-06 NOTE — Telephone Encounter (Signed)
I am adding this to this one message so its all together I have called and spoke to the Social worker the CTA is not till 05/26/21 they are also wanting to resc her Bronch.    LMAM for [name]      Note     Elton Sin, LPN routed conversation to [employer] 4 days ago   Elton Sin, LPN 4 days ago   LT Patient scheduled 04/14/21 for CTA, Covid test 05/13/21, and bronchoscopy 05/17/21. Called and spoke with social worker [name], with [place].  [name] stated Patient is currently sick with flu like symptoms and can not use transportation, because of illness.  [name] stated CTA, Covid test, and bronchoscopy will need rescheduling. [name] requested appointments in late morning or afternoons. [name] requested appointment times along with Patient, for scheduling. [name] contact # [phone], ext [phone].     Message routed to [facility]       Note     [name], [name] routed conversation to [facility] Triage Pool 4 days ago   [name],[name] [phone]  [name] 4 days ago   [name] Education officer, museum states patient needs to reschedule CT chest and Bronch procedure. States patient is sick. [name] phone number is [phone] w or [phone] c.   Incoming call

## 2021-05-09 DIAGNOSIS — R270 Ataxia, unspecified: Secondary | ICD-10-CM | POA: Diagnosis not present

## 2021-05-09 DIAGNOSIS — M170 Bilateral primary osteoarthritis of knee: Secondary | ICD-10-CM | POA: Diagnosis not present

## 2021-05-09 DIAGNOSIS — F17200 Nicotine dependence, unspecified, uncomplicated: Secondary | ICD-10-CM | POA: Diagnosis not present

## 2021-05-10 DIAGNOSIS — R270 Ataxia, unspecified: Secondary | ICD-10-CM | POA: Diagnosis not present

## 2021-05-10 DIAGNOSIS — F17200 Nicotine dependence, unspecified, uncomplicated: Secondary | ICD-10-CM | POA: Diagnosis not present

## 2021-05-10 DIAGNOSIS — M170 Bilateral primary osteoarthritis of knee: Secondary | ICD-10-CM | POA: Diagnosis not present

## 2021-05-11 DIAGNOSIS — F17200 Nicotine dependence, unspecified, uncomplicated: Secondary | ICD-10-CM | POA: Diagnosis not present

## 2021-05-11 DIAGNOSIS — M170 Bilateral primary osteoarthritis of knee: Secondary | ICD-10-CM | POA: Diagnosis not present

## 2021-05-11 DIAGNOSIS — R270 Ataxia, unspecified: Secondary | ICD-10-CM | POA: Diagnosis not present

## 2021-05-11 NOTE — Telephone Encounter (Signed)
Left message for [name] ENB resc to 06/14/21 arrive at 9:30am at [facility] will need to do her Covid test 06/10/21

## 2021-05-11 NOTE — Telephone Encounter (Signed)
Spoke to [place] I'm going to fax her the letter with the instructions to [phone]

## 2021-05-11 NOTE — Telephone Encounter (Signed)
Left message for [name] to see about one of those days to schedule pt bronch

## 2021-05-11 NOTE — Telephone Encounter (Signed)
Spoke to [name] they can do the 06/14/2021 but would need between 10 and 2 for the procedure

## 2021-05-12 DIAGNOSIS — M170 Bilateral primary osteoarthritis of knee: Secondary | ICD-10-CM | POA: Diagnosis not present

## 2021-05-12 DIAGNOSIS — R270 Ataxia, unspecified: Secondary | ICD-10-CM | POA: Diagnosis not present

## 2021-05-12 DIAGNOSIS — F17200 Nicotine dependence, unspecified, uncomplicated: Secondary | ICD-10-CM | POA: Diagnosis not present

## 2021-05-13 DIAGNOSIS — F17200 Nicotine dependence, unspecified, uncomplicated: Secondary | ICD-10-CM | POA: Diagnosis not present

## 2021-05-13 DIAGNOSIS — R270 Ataxia, unspecified: Secondary | ICD-10-CM | POA: Diagnosis not present

## 2021-05-13 DIAGNOSIS — M170 Bilateral primary osteoarthritis of knee: Secondary | ICD-10-CM | POA: Diagnosis not present

## 2021-05-16 DIAGNOSIS — M170 Bilateral primary osteoarthritis of knee: Secondary | ICD-10-CM | POA: Diagnosis not present

## 2021-05-16 DIAGNOSIS — R270 Ataxia, unspecified: Secondary | ICD-10-CM | POA: Diagnosis not present

## 2021-05-16 DIAGNOSIS — F17200 Nicotine dependence, unspecified, uncomplicated: Secondary | ICD-10-CM | POA: Diagnosis not present

## 2021-05-17 ENCOUNTER — Encounter (HOSPITAL_COMMUNITY): Admission: RE | Payer: Self-pay | Source: Home / Self Care

## 2021-05-17 ENCOUNTER — Ambulatory Visit (HOSPITAL_COMMUNITY): Admission: RE | Admit: 2021-05-17 | Payer: Medicare Other | Source: Home / Self Care | Admitting: Pulmonary Disease

## 2021-05-17 DIAGNOSIS — R270 Ataxia, unspecified: Secondary | ICD-10-CM | POA: Diagnosis not present

## 2021-05-17 DIAGNOSIS — F17200 Nicotine dependence, unspecified, uncomplicated: Secondary | ICD-10-CM | POA: Diagnosis not present

## 2021-05-17 DIAGNOSIS — M170 Bilateral primary osteoarthritis of knee: Secondary | ICD-10-CM | POA: Diagnosis not present

## 2021-05-17 DIAGNOSIS — R911 Solitary pulmonary nodule: Secondary | ICD-10-CM | POA: Insufficient documentation

## 2021-05-17 SURGERY — VIDEO BRONCHOSCOPY WITH ENDOBRONCHIAL NAVIGATION
Anesthesia: General | Laterality: Bilateral

## 2021-05-18 DIAGNOSIS — F17200 Nicotine dependence, unspecified, uncomplicated: Secondary | ICD-10-CM | POA: Diagnosis not present

## 2021-05-18 DIAGNOSIS — M170 Bilateral primary osteoarthritis of knee: Secondary | ICD-10-CM | POA: Diagnosis not present

## 2021-05-18 DIAGNOSIS — R270 Ataxia, unspecified: Secondary | ICD-10-CM | POA: Diagnosis not present

## 2021-05-19 ENCOUNTER — Other Ambulatory Visit (HOSPITAL_COMMUNITY): Payer: Self-pay | Admitting: Psychiatry

## 2021-05-19 DIAGNOSIS — R270 Ataxia, unspecified: Secondary | ICD-10-CM | POA: Diagnosis not present

## 2021-05-19 DIAGNOSIS — F17200 Nicotine dependence, unspecified, uncomplicated: Secondary | ICD-10-CM | POA: Diagnosis not present

## 2021-05-19 DIAGNOSIS — M170 Bilateral primary osteoarthritis of knee: Secondary | ICD-10-CM | POA: Diagnosis not present

## 2021-05-20 DIAGNOSIS — F17200 Nicotine dependence, unspecified, uncomplicated: Secondary | ICD-10-CM | POA: Diagnosis not present

## 2021-05-20 DIAGNOSIS — M170 Bilateral primary osteoarthritis of knee: Secondary | ICD-10-CM | POA: Diagnosis not present

## 2021-05-20 DIAGNOSIS — R270 Ataxia, unspecified: Secondary | ICD-10-CM | POA: Diagnosis not present

## 2021-05-23 DIAGNOSIS — M170 Bilateral primary osteoarthritis of knee: Secondary | ICD-10-CM | POA: Diagnosis not present

## 2021-05-23 DIAGNOSIS — F17200 Nicotine dependence, unspecified, uncomplicated: Secondary | ICD-10-CM | POA: Diagnosis not present

## 2021-05-23 DIAGNOSIS — R270 Ataxia, unspecified: Secondary | ICD-10-CM | POA: Diagnosis not present

## 2021-05-24 DIAGNOSIS — R270 Ataxia, unspecified: Secondary | ICD-10-CM | POA: Diagnosis not present

## 2021-05-24 DIAGNOSIS — M170 Bilateral primary osteoarthritis of knee: Secondary | ICD-10-CM | POA: Diagnosis not present

## 2021-05-24 DIAGNOSIS — F17200 Nicotine dependence, unspecified, uncomplicated: Secondary | ICD-10-CM | POA: Diagnosis not present

## 2021-05-25 DIAGNOSIS — M170 Bilateral primary osteoarthritis of knee: Secondary | ICD-10-CM | POA: Diagnosis not present

## 2021-05-25 DIAGNOSIS — F17200 Nicotine dependence, unspecified, uncomplicated: Secondary | ICD-10-CM | POA: Diagnosis not present

## 2021-05-25 DIAGNOSIS — R270 Ataxia, unspecified: Secondary | ICD-10-CM | POA: Diagnosis not present

## 2021-05-26 ENCOUNTER — Ambulatory Visit
Admission: RE | Admit: 2021-05-26 | Discharge: 2021-05-26 | Disposition: A | Discharge status: Home / Self Care | Payer: Medicare Other | Source: Ambulatory Visit | Attending: Pulmonary Disease | Admitting: Pulmonary Disease

## 2021-05-26 ENCOUNTER — Other Ambulatory Visit: Payer: Self-pay

## 2021-05-26 DIAGNOSIS — R270 Ataxia, unspecified: Secondary | ICD-10-CM | POA: Diagnosis not present

## 2021-05-26 DIAGNOSIS — R911 Solitary pulmonary nodule: Secondary | ICD-10-CM

## 2021-05-26 DIAGNOSIS — I2510 Atherosclerotic heart disease of native coronary artery without angina pectoris: Secondary | ICD-10-CM | POA: Diagnosis not present

## 2021-05-26 DIAGNOSIS — M170 Bilateral primary osteoarthritis of knee: Secondary | ICD-10-CM | POA: Diagnosis not present

## 2021-05-26 DIAGNOSIS — I700 Atherosclerosis of aorta: Secondary | ICD-10-CM | POA: Diagnosis not present

## 2021-05-26 DIAGNOSIS — F17200 Nicotine dependence, unspecified, uncomplicated: Secondary | ICD-10-CM | POA: Diagnosis not present

## 2021-05-26 MED ORDER — IOPAMIDOL (ISOVUE-370) INJECTION 76%
75.0000 mL | Freq: Once | INTRAVENOUS | Status: AC | PRN
  Administered 2021-05-26: 11:00:00 75 mL via INTRAVENOUS

## 2021-05-27 DIAGNOSIS — R270 Ataxia, unspecified: Secondary | ICD-10-CM | POA: Diagnosis not present

## 2021-05-27 DIAGNOSIS — M170 Bilateral primary osteoarthritis of knee: Secondary | ICD-10-CM | POA: Diagnosis not present

## 2021-05-27 DIAGNOSIS — F17200 Nicotine dependence, unspecified, uncomplicated: Secondary | ICD-10-CM | POA: Diagnosis not present

## 2021-05-30 ENCOUNTER — Telehealth: Payer: Self-pay | Admitting: Pulmonary Disease

## 2021-05-30 DIAGNOSIS — M170 Bilateral primary osteoarthritis of knee: Secondary | ICD-10-CM | POA: Diagnosis not present

## 2021-05-30 DIAGNOSIS — R270 Ataxia, unspecified: Secondary | ICD-10-CM | POA: Diagnosis not present

## 2021-05-30 DIAGNOSIS — F17200 Nicotine dependence, unspecified, uncomplicated: Secondary | ICD-10-CM | POA: Diagnosis not present

## 2021-05-30 NOTE — Telephone Encounter (Signed)
Super D CT received this afternoon and placed in Dr. Juline Patch office to be reviewed as needed.

## 2021-05-31 DIAGNOSIS — R270 Ataxia, unspecified: Secondary | ICD-10-CM | POA: Diagnosis not present

## 2021-05-31 DIAGNOSIS — F17200 Nicotine dependence, unspecified, uncomplicated: Secondary | ICD-10-CM | POA: Diagnosis not present

## 2021-05-31 DIAGNOSIS — M170 Bilateral primary osteoarthritis of knee: Secondary | ICD-10-CM | POA: Diagnosis not present

## 2021-06-01 DIAGNOSIS — F17200 Nicotine dependence, unspecified, uncomplicated: Secondary | ICD-10-CM | POA: Diagnosis not present

## 2021-06-01 DIAGNOSIS — M170 Bilateral primary osteoarthritis of knee: Secondary | ICD-10-CM | POA: Diagnosis not present

## 2021-06-01 DIAGNOSIS — R270 Ataxia, unspecified: Secondary | ICD-10-CM | POA: Diagnosis not present

## 2021-06-03 DIAGNOSIS — F17200 Nicotine dependence, unspecified, uncomplicated: Secondary | ICD-10-CM | POA: Diagnosis not present

## 2021-06-03 DIAGNOSIS — R270 Ataxia, unspecified: Secondary | ICD-10-CM | POA: Diagnosis not present

## 2021-06-03 DIAGNOSIS — M170 Bilateral primary osteoarthritis of knee: Secondary | ICD-10-CM | POA: Diagnosis not present

## 2021-06-06 ENCOUNTER — Ambulatory Visit: Payer: Medicare Other | Admitting: Family Medicine

## 2021-06-06 DIAGNOSIS — M170 Bilateral primary osteoarthritis of knee: Secondary | ICD-10-CM | POA: Diagnosis not present

## 2021-06-06 DIAGNOSIS — R270 Ataxia, unspecified: Secondary | ICD-10-CM | POA: Diagnosis not present

## 2021-06-06 DIAGNOSIS — F17200 Nicotine dependence, unspecified, uncomplicated: Secondary | ICD-10-CM | POA: Diagnosis not present

## 2021-06-07 DIAGNOSIS — F17200 Nicotine dependence, unspecified, uncomplicated: Secondary | ICD-10-CM | POA: Diagnosis not present

## 2021-06-07 DIAGNOSIS — M170 Bilateral primary osteoarthritis of knee: Secondary | ICD-10-CM | POA: Diagnosis not present

## 2021-06-07 DIAGNOSIS — R270 Ataxia, unspecified: Secondary | ICD-10-CM | POA: Diagnosis not present

## 2021-06-08 DIAGNOSIS — F17200 Nicotine dependence, unspecified, uncomplicated: Secondary | ICD-10-CM | POA: Diagnosis not present

## 2021-06-08 DIAGNOSIS — R270 Ataxia, unspecified: Secondary | ICD-10-CM | POA: Diagnosis not present

## 2021-06-08 DIAGNOSIS — M170 Bilateral primary osteoarthritis of knee: Secondary | ICD-10-CM | POA: Diagnosis not present

## 2021-06-09 ENCOUNTER — Encounter: Payer: Self-pay | Admitting: Family Medicine

## 2021-06-09 ENCOUNTER — Ambulatory Visit (INDEPENDENT_AMBULATORY_CARE_PROVIDER_SITE_OTHER): Payer: Medicare Other | Admitting: Family Medicine

## 2021-06-09 ENCOUNTER — Other Ambulatory Visit: Payer: Self-pay

## 2021-06-09 VITALS — BP 130/84 | Temp 97.5°F | Wt 226.4 lb

## 2021-06-09 DIAGNOSIS — Z23 Encounter for immunization: Secondary | ICD-10-CM

## 2021-06-09 DIAGNOSIS — R270 Ataxia, unspecified: Secondary | ICD-10-CM | POA: Diagnosis not present

## 2021-06-09 DIAGNOSIS — I10 Essential (primary) hypertension: Secondary | ICD-10-CM | POA: Diagnosis not present

## 2021-06-09 DIAGNOSIS — M170 Bilateral primary osteoarthritis of knee: Secondary | ICD-10-CM | POA: Diagnosis not present

## 2021-06-09 DIAGNOSIS — E119 Type 2 diabetes mellitus without complications: Secondary | ICD-10-CM | POA: Diagnosis not present

## 2021-06-09 DIAGNOSIS — F17200 Nicotine dependence, unspecified, uncomplicated: Secondary | ICD-10-CM | POA: Diagnosis not present

## 2021-06-09 NOTE — Progress Notes (Addendum)
   Subjective:    Patient ID: [name], female    DOB: 10-Feb-1961, 60 y.o.   MRN: [id number]  HPI Pt is here with Sharyn Lull (Case Worker). Pt here for follow up. Sugars have been doing ok. Pt states last time she checked sugar is was 285. Pt states she has been hurting "in the wrong places". Pt states this has been going on for months. [facility] gave her some Preparation H and that is not helping.    Review of Systems     Objective:   Physical Exam  General-in no acute distress Eyes-no discharge Lungs-respiratory rate normal, CTA CV-no murmurs,RRR Extremities skin warm dry no edema Neuro grossly normal Behavior normal, alert   Labs ordered    Assessment & Plan:  Patient followed by gastroenterology States having ongoing trouble with hemorrhoids I recommend that she follow back up with gastroenterology Stool softeners and Preparation H as needed  Has upcoming lung biopsy still smokes was encouraged to quit smoking.  Hopefully her surgery will come back benign  Diabetes she states her sugar numbers have been elevated recent A1c looked good she is due for repeat we will repeat lab work on this  Moderate obesity healthy diet regular activity recommended  Patient does have diabetic neuropathy and also foot deformity.  She would benefit from diabetic shoes.

## 2021-06-10 ENCOUNTER — Encounter: Payer: Self-pay | Admitting: Family Medicine

## 2021-06-10 ENCOUNTER — Other Ambulatory Visit: Payer: Self-pay | Admitting: Pulmonary Disease

## 2021-06-10 DIAGNOSIS — R270 Ataxia, unspecified: Secondary | ICD-10-CM | POA: Diagnosis not present

## 2021-06-10 DIAGNOSIS — F17200 Nicotine dependence, unspecified, uncomplicated: Secondary | ICD-10-CM | POA: Diagnosis not present

## 2021-06-10 DIAGNOSIS — M170 Bilateral primary osteoarthritis of knee: Secondary | ICD-10-CM | POA: Diagnosis not present

## 2021-06-10 LAB — BASIC METABOLIC PANEL
BUN/Creatinine Ratio: 8 — ABNORMAL LOW (ref 9–23)
BUN: 7 mg/dL (ref 6–24)
CO2: 29 mmol/L (ref 20–29)
Calcium: 9.5 mg/dL (ref 8.7–10.2)
Chloride: 97 mmol/L (ref 96–106)
Creatinine, Ser: 0.83 mg/dL (ref 0.57–1.00)
Glucose: 81 mg/dL (ref 70–99)
Potassium: 4.3 mmol/L (ref 3.5–5.2)
Sodium: 140 mmol/L (ref 134–144)
eGFR: 82 mL/min/{1.73_m2} (ref >59)

## 2021-06-10 LAB — HEMOGLOBIN A1C
Est. average glucose Bld gHb Est-mCnc: 128 mg/dL
Hgb A1c MFr Bld: 6.1 % — ABNORMAL HIGH (ref 4.8–5.6)

## 2021-06-10 LAB — SARS CORONAVIRUS 2 (TAT 6-24 HRS): SARS Coronavirus 2: NEGATIVE

## 2021-06-13 ENCOUNTER — Other Ambulatory Visit: Payer: Self-pay

## 2021-06-13 ENCOUNTER — Encounter (HOSPITAL_COMMUNITY): Payer: Self-pay | Admitting: Pulmonary Disease

## 2021-06-13 ENCOUNTER — Ambulatory Visit (INDEPENDENT_AMBULATORY_CARE_PROVIDER_SITE_OTHER): Payer: Medicare Other | Admitting: Podiatry

## 2021-06-13 ENCOUNTER — Encounter: Payer: Self-pay | Admitting: Podiatry

## 2021-06-13 DIAGNOSIS — M2141 Flat foot [pes planus] (acquired), right foot: Secondary | ICD-10-CM

## 2021-06-13 DIAGNOSIS — E119 Type 2 diabetes mellitus without complications: Secondary | ICD-10-CM

## 2021-06-13 DIAGNOSIS — E1142 Type 2 diabetes mellitus with diabetic polyneuropathy: Secondary | ICD-10-CM | POA: Diagnosis not present

## 2021-06-13 DIAGNOSIS — M2012 Hallux valgus (acquired), left foot: Secondary | ICD-10-CM | POA: Diagnosis not present

## 2021-06-13 DIAGNOSIS — M2011 Hallux valgus (acquired), right foot: Secondary | ICD-10-CM

## 2021-06-13 DIAGNOSIS — F17200 Nicotine dependence, unspecified, uncomplicated: Secondary | ICD-10-CM | POA: Diagnosis not present

## 2021-06-13 DIAGNOSIS — M2142 Flat foot [pes planus] (acquired), left foot: Secondary | ICD-10-CM | POA: Diagnosis not present

## 2021-06-13 DIAGNOSIS — B351 Tinea unguium: Secondary | ICD-10-CM | POA: Diagnosis not present

## 2021-06-13 DIAGNOSIS — M79676 Pain in unspecified toe(s): Secondary | ICD-10-CM

## 2021-06-13 DIAGNOSIS — R270 Ataxia, unspecified: Secondary | ICD-10-CM | POA: Diagnosis not present

## 2021-06-13 DIAGNOSIS — M170 Bilateral primary osteoarthritis of knee: Secondary | ICD-10-CM | POA: Diagnosis not present

## 2021-06-13 NOTE — Progress Notes (Signed)
ANNUAL DIABETIC FOOT EXAM  Subjective: [name] presents today for for annual diabetic foot examination, at risk foot care with history of diabetic neuropathy, and painful elongated mycotic toenails 1-5 bilaterally which are tender when wearing enclosed shoe gear. Pain is relieved with periodic professional debridement.  She is accompanied by her case worker, Sharyn Lull.  Patient relates >30 year h/o diabetes.  Patient denies any h/o foot wounds.  Patient has been diagnosed with neuropathy.  Patient has not been checking her blood glucose stating she has a problem with her glucometer.  Patient states her new shoes are rubbing her bunions and are uncomfortable.  Denise Drown, MD is patient's PCP. Last visit was 06/09/2021.   Past Medical History:  Diagnosis Date   Anxiety    Aortic atherosclerosis ([place]) 09/19/2020   Seen on CAT scan 2021   Arthritis    Depression    Diabetes mellitus    Diabetes mellitus, type II ([place])    Dyspnea    Dysrhythmia    GERD (gastroesophageal reflux disease)    Heart murmur    HTN (hypertension)    Hyperglycemia    IBS (irritable bowel syndrome)    Lung nodules    right, followed by PCP, PET 11/2011   Mental retardation    MI (myocardial infarction) ([place])    Migraines    Pneumonia    Sleep apnea    cpap    Stroke [facility])    Patient Active Problem List   Diagnosis Date Noted   Lung nodule 06/14/2021   S/P bronchoscopy with biopsy    Degenerative arthritis of right knee 11/04/2020   Osteoarthritis of right knee 11/04/2020   Aortic atherosclerosis ([place]) 58/83/2549   History of Helicobacter pylori infection 07/23/2020   S/P hysterectomy 04/19/2020   History of vaginal bleeding 04/19/2020   Fall in bathtub    Neck pain    Primary osteoarthritis of both knees 01/27/2019   Hyperlipidemia 09/26/2017   Dysphagia 08/15/2016   Obstructive sleep apnea syndrome 06/27/2016   Rectal bleeding 04/07/2013   Melena 03/04/2013   Hematemesis  03/04/2013   Abdominal pain, epigastric 03/04/2013   Hypokalemia 03/04/2013   Smoker 01/16/2012   Diarrhea 12/26/2011   Pulmonary nodule, right 12/26/2011   Abdominal pain 10/08/2011   Fatty liver 08/24/2011   Constipation 10/20/2010   NAUSEA AND VOMITING 06/09/2010   HEMATOCHEZIA 04/21/2010   OTHER DYSPHAGIA 04/21/2010   MILD MENTAL RETARDATION 02/17/2009   GLAUCOMA 02/17/2009   ANXIETY 01/15/2009   MIGRAINE HEADACHE 01/15/2009   Essential hypertension 01/15/2009   HEMORRHOIDS 01/15/2009   GASTROESOPHAGEAL REFLUX DISEASE, CHRONIC 01/15/2009   CONSTIPATION, CHRONIC 01/15/2009   IBS (irritable bowel syndrome) 01/15/2009   KNEE PAIN, CHRONIC 01/15/2009   BACK PAIN, CHRONIC 01/15/2009   Hx of adenomatous colonic polyps 01/15/2009   Past Surgical History:  Procedure Laterality Date   ABDOMINAL HYSTERECTOMY     BIOPSY  01/15/2020   Procedure: BIOPSY;  Surgeon: Denise Dolin, MD;  [facility];  Service: Endoscopy;;  gastric   BRONCHIAL BIOPSY  06/14/2021   Procedure: BRONCHIAL BIOPSIES;  Surgeon: Garner Nash, DO;  Location: [place] [facility];  Service: Pulmonary;;   BRONCHIAL BRUSHINGS  06/14/2021   Procedure: BRONCHIAL BRUSHINGS;  Surgeon: Garner Nash, DO;  Location: [place] [facility];  Service: Pulmonary;;   BRONCHIAL NEEDLE ASPIRATION BIOPSY  06/14/2021   Procedure: BRONCHIAL NEEDLE ASPIRATION BIOPSIES;  Surgeon: Garner Nash, DO;  Location: [facility];  Service: Pulmonary;;   COLONOSCOPY  11/2007   hyperplastic polyps, prior hx of adenomas    COLONOSCOPY  05/2010   incomplete due to poor prep, hyperplastic rectal polyp   COLONOSCOPY  05/05/2002   Dimunitive polyps in the rectum and left colon, cold    biopsied/removed.  Scattered few left-sided diverticula.  Regular colonic   mucosa appeared normal   COLONOSCOPY N/A 05/28/2013   [name]: mulitple tubular adenomas removed. next tcs 05/2016   COLONOSCOPY WITH PROPOFOL N/A 09/07/2016   Dr. Gala Bryant: For hyperplastic  polyps removed. Next colonoscopy March 2023 given history of adenomatous colon polyps in the past.   ESOPHAGOGASTRODUODENOSCOPY  08/2007   moderate sized hiatal hernia   ESOPHAGOGASTRODUODENOSCOPY  05/2010   noncritical schatzki ring s/p 62F   ESOPHAGOGASTRODUODENOSCOPY (EGD) WITH ESOPHAGEAL DILATION N/A 02/06/2013   UMP:NTIRWE esophagus-s/p dilation up to a 64 [place] size with [facility] dilators.  Hiatal hernia   ESOPHAGOGASTRODUODENOSCOPY (EGD) WITH PROPOFOL N/A 09/07/2016   Dr. Gala Bryant: Normal, status post empiric dilation of the esophagus for history of dysphagia   ESOPHAGOGASTRODUODENOSCOPY (EGD) WITH PROPOFOL N/A 05/02/2019   Normal esophagus s/p dilation, normal stomach, normal duodenum   ESOPHAGOGASTRODUODENOSCOPY (EGD) WITH PROPOFOL N/A 01/15/2020   normal esophagus s/p dilation, gastric nodule s/p biopsy. This showed reactive gastropathy with H.pylori.    EXTERNAL EAR SURGERY     bilateral   FIDUCIAL MARKER PLACEMENT  06/14/2021   Procedure: FIDUCIAL MARKER PLACEMENT;  Surgeon: Garner Nash, DO;  Location: [place] [facility];  Service: Pulmonary;;   FOOT SURGERY     GLAUCOMA SURGERY     KNEE ARTHROPLASTY Right 11/04/2020   Procedure: COMPUTER ASSISTED TOTAL KNEE ARTHROPLASTY;  Surgeon: Denise Can, MD;  Location: [facility];  Service: Orthopedics;  Laterality: Right;   MALONEY DILATION N/A 09/07/2016   Procedure: Venia Minks DILATION;  Surgeon: Denise Dolin, MD;  [facility];  Service: Endoscopy;  Laterality: N/A;   MALONEY DILATION N/A 05/02/2019   Procedure: Venia Minks DILATION;  Surgeon: Denise Dolin, MD;  [facility];  Service: Endoscopy;  Laterality: N/A;   MALONEY DILATION N/A 01/15/2020   Procedure: Venia Minks DILATION;  Surgeon: Denise Dolin, MD;  [facility];  Service: Endoscopy;  Laterality: N/A;   POLYPECTOMY  09/07/2016   Procedure: POLYPECTOMY;  Surgeon: Denise Dolin, MD;  [facility];  Service: Endoscopy;;  sigmoid colon x4   small  bowel capsule  10/2007   normal   VIDEO BRONCHOSCOPY WITH RADIAL ENDOBRONCHIAL ULTRASOUND  06/14/2021   Procedure: VIDEO BRONCHOSCOPY WITH RADIAL ENDOBRONCHIAL ULTRASOUND;  Surgeon: Garner Nash, DO;  Location: [place] [facility];  Service: Pulmonary;;   Current Outpatient Medications on File Prior to Visit  Medication Sig Dispense Refill   atorvastatin (LIPITOR) 10 MG tablet TAKE 1 TABLET BY MOUTH ONCE A DAY. 30 tablet 5   benztropine (COGENTIN) 1 MG tablet Take 1 tablet (1 mg total) by mouth daily. 90 tablet 2   blood glucose meter kit and supplies Dispense based on patient and insurance preference. Use to test glucose once daily. (FOR ICD10, E11.9). 1 each 0   cycloSPORINE (RESTASIS) 0.05 % ophthalmic emulsion Place 1 drop into both eyes 2 (two) times daily.     dicyclomine (BENTYL) 10 MG capsule TAKE 1 CAPSULE BY MOUTH UP TO 3 TIMES DAILY BEFORE MEALS AS NEEDED FOR ABDOMINAL CRAMPS/LOOSE STOOLS. 90 capsule 3   docusate sodium (COLACE) 100 MG capsule TAKE 1 CAPSULE BY MOUTH TWICE DAILY 60 capsule 5   lamoTRIgine (LAMICTAL) 100 MG  tablet Take 1 tablet (100 mg total) by mouth 2 (two) times daily. 180 tablet 2   loperamide (IMODIUM) 2 MG capsule Take 2 mg by mouth 4 (four) times daily as needed for diarrhea or loose stools.     ondansetron (ZOFRAN) 4 MG tablet Take 1 tablet (4 mg total) by mouth every 6 (six) hours as needed for nausea. 20 tablet 0   paliperidone (INVEGA) 9 MG 24 hr tablet Take 1 tablet (9 mg total) by mouth daily. 90 tablet 2   potassium chloride (KLOR-CON) 10 MEQ tablet TAKE 2 TABLETS (20MEQ) BY MOUTH TWICE DAILY WITH FOOD 120 tablet 0   propranolol (INDERAL) 10 MG tablet Take 1 tablet (10 mg total) by mouth 3 (three) times daily. 90 tablet 0   RABEprazole (ACIPHEX) 20 MG tablet Take 1 tablet (20 mg total) by mouth 2 (two) times daily before a meal. 180 tablet 3   senna (SENNA-TABS) 8.6 MG tablet TAKE 1 TABLET BY MOUTH 2 TIMES A DAY 60 tablet 5   sertraline (ZOLOFT) 100 MG tablet  TAKE 1 TABLET BY MOUTH ONCE A DAY. 30 tablet 0   torsemide (DEMADEX) 20 MG tablet TAKE (1) TABLET BY MOUTH EACH MORNING. 30 tablet 5   traZODone (DESYREL) 100 MG tablet TAKE 2 TABLETS(200MG) BY MOUTH AT BEDTIME. 60 tablet 0   No current facility-administered medications on file prior to visit.    Allergies  Allergen Reactions   Thorazine [Chlorpromazine Hcl] Anaphylaxis   Acetaminophen Other (See Comments)    Makes pt dizzy   Aspirin Other (See Comments)    seizure   Aspirin-Acetaminophen-Caffeine Other (See Comments)    seizure   Nsaids Nausea And Vomiting   Other     Acidic foods   Penicillins Nausea And Vomiting    Has patient had a PCN reaction causing immediate rash, facial/tongue/throat swelling, SOB or lightheadedness with hypotension:Yes Has patient had a PCN reaction causing severe rash involving mucus membranes or skin necrosis:Yes Has patient had a PCN reaction that required hospitalization:Yes Has patient had a PCN reaction occurring within the last 10 years:No If all of the above answers are "NO", then may proceed with Cephalosporin use.    Tomato Rash   Social History   Occupational History   Occupation: disabled    Fish farm manager: UNEMPLOYED  Tobacco Use   Smoking status: Every Day    Packs/day: 0.50    Years: 40.00    Pack years: 20.00    Types: Cigarettes   Smokeless tobacco: Never  Vaping Use   Vaping Use: Never used  Substance and Sexual Activity   Alcohol use: No   Drug use: No   Sexual activity: Not Currently    Partners: Male    Birth control/protection: Surgical    Comment: hyst   Family History  Problem Relation Age of Onset   Stroke Mother    Heart attack Father    Schizophrenia Other    Drug abuse Other    Alcohol abuse Other    Colon cancer Other        aunt   Obesity Other    COPD Other    Anxiety disorder Other    GER disease Other    Diabetes type II Other    Anxiety disorder Other    Depression Other    Depression Sister     Schizophrenia Sister    Liver disease Neg Hx    Inflammatory bowel disease Neg Hx    Immunization History  Administered  Date(s) Administered   Influenza Split 04/28/2013   Influenza,inj,Quad PF,6+ Mos 06/09/2015, 04/23/2017, 03/23/2019, 05/31/2020, 06/09/2021   Influenza-Unspecified 05/12/2014, 04/28/2016, 05/01/2018   Moderna Sars-Covid-2 Vaccination 09/08/2019, 10/08/2019   Pneumococcal Polysaccharide-23 03/03/2014   Zoster Recombinat (Shingrix) 05/18/2021     Review of Systems: Negative except as noted in the HPI.   Objective: There were no vitals filed for this visit.  UNKNOWN FLANNIGAN is a pleasant 60 y.o. female in NAD. AAO X 3.  Vascular Examination: CFT <3 seconds b/l LE. Palpable DP/PT pulses b/l LE. Digital hair absent b/l. Skin temperature gradient WNL b/l. No pain with calf compression b/l. No edema noted b/l. No cyanosis or clubbing noted b/l LE.  Dermatological Examination: Pedal integument with normal turgor, texture and tone b/l LE. No open wounds b/l. No interdigital macerations b/l. Toenails 1-5 b/l elongated, thickened, discolored with subungual debris. +Tenderness with dorsal palpation of nailplates. No hyperkeratotic or porokeratotic lesions present.  Musculoskeletal Examination: Muscle strength 5/5 to all lower extremity muscle groups bilaterally. Shoe inspection reveals [employer] Sneakers with medium width which are not wide enough. HAV with bunion deformity noted b/l LE. Hammertoe deformity noted 2-5 b/l. Pes planus deformity noted bilateral LE.  Footwear Assessment: Does the patient wear appropriate shoes? No, they are medium width. She needs wide width. Does the patient need inserts/orthotics? Yes.  Neurological Examination: Pt has subjective symptoms of neuropathy. Protective sensation diminished with 10g monofilament b/l.  Hemoglobin A1C Latest Ref Rng & Units 06/09/2021 03/03/2021 10/25/2020 07/16/2020  HGBA1C 4.8 - 5.6 % 6.1(H) 6.3(H) 6.0(H) 6.5(H)   Some recent data might be hidden   Assessment: 1. Pain due to onychomycosis of toenail   2. Hallux valgus, acquired, bilateral   3. Pes planus of both feet   4. Diabetic polyneuropathy associated with type 2 diabetes mellitus ([place])   5. Encounter for diabetic foot exam ([place])     ADA Risk Categorization: High Risk  Patient has one or more of the following: Loss of protective sensation Absent pedal pulses Severe Foot deformity History of foot ulcer  Plan: -Examined patient. -Case worker, Sharyn Lull, will assist with glucometer issue. -Diabetic foot examination performed today. -Continue foot and shoe inspections daily. Monitor blood glucose per PCP/Endocrinologist's recommendations. -Patient to continue soft, supportive shoe gear daily. Start procedure for diabetic shoes. Patient qualifies based on diagnoses. -Mycotic toenails 1-5 bilaterally were debrided in length and girth with sterile nail nippers and dremel without incident. -Patient/POA to call should there be question/concern in the interim.  Return in about 3 months (around 09/11/2021).  Marzetta Board, DPM

## 2021-06-13 NOTE — Patient Instructions (Signed)
We will schedule an appointment for you to be fitted for diabetic shoes in January.  Once you receive your new glucometer, please check your blood sugar as instructed by Dr. Wolfgang Phoenix  Diabetes Mellitus and [place] care is an important part of your health, especially when you have diabetes. Diabetes may cause you to have problems because of poor blood flow (circulation) to your feet and legs, which can cause your skin to: Become thinner and drier. Break more easily. Heal more slowly. Peel and crack. You may also have nerve damage (neuropathy) in your legs and feet, causing decreased feeling in them. This means that you may not notice minor injuries to your feet that could lead to more serious problems. Noticing and addressing any potential problems early is the best way to prevent future foot problems. How to care for your feet Foot hygiene  Wash your feet daily with warm water and mild soap. Do not use hot water. Then, pat your feet and the areas between your toes until they are completely dry. Do not soak your feet as this can dry your skin. Trim your toenails straight across. Do not dig under them or around the cuticle. File the edges of your nails with an emery board or nail file. Apply a moisturizing lotion or petroleum jelly to the skin on your feet and to dry, brittle toenails. Use lotion that does not contain alcohol and is unscented. Do not apply lotion between your toes. Shoes and socks Wear clean socks or stockings every day. Make sure they are not too tight. Do not wear knee-high stockings since they may decrease blood flow to your legs. Wear shoes that fit properly and have enough cushioning. Always look in your shoes before you put them on to be sure there are no objects inside. To break in new shoes, wear them for just a few hours a day. This prevents injuries on your feet. Wounds, scrapes, corns, and calluses  Check your feet daily for blisters, cuts, bruises, sores, and  redness. If you cannot see the bottom of your feet, use a mirror or ask someone for help. Do not cut corns or calluses or try to remove them with medicine. If you find a minor scrape, cut, or break in the skin on your feet, keep it and the skin around it clean and dry. You may clean these areas with mild soap and water. Do not clean the area with peroxide, alcohol, or iodine. If you have a wound, scrape, corn, or callus on your foot, look at it several times a day to make sure it is healing and not infected. Check for: Redness, swelling, or pain. Fluid or blood. Warmth. Pus or a bad smell. General tips Do not cross your legs. This may decrease blood flow to your feet. Do not use heating pads or hot water bottles on your feet. They may burn your skin. If you have lost feeling in your feet or legs, you may not know this is happening until it is too late. Protect your feet from hot and cold by wearing shoes, such as at the beach or on hot pavement. Schedule a complete foot exam at least once a year (annually) or more often if you have foot problems. Report any cuts, sores, or bruises to your health care provider immediately. Where to find more information American Diabetes Association: www.diabetes.org Association of Diabetes [employer] & Education Specialists: www.diabeteseducator.org Contact a health care provider if: You have a medical condition that  increases your risk of infection and you have any cuts, sores, or bruises on your feet. You have an injury that is not healing. You have redness on your legs or feet. You feel burning or tingling in your legs or feet. You have pain or cramps in your legs and feet. Your legs or feet are numb. Your feet always feel cold. You have pain around any toenails. Get help right away if: You have a wound, scrape, corn, or callus on your foot and: You have pain, swelling, or redness that gets worse. You have fluid or blood coming from the wound, scrape, corn,  or callus. Your wound, scrape, corn, or callus feels warm to the touch. You have pus or a bad smell coming from the wound, scrape, corn, or callus. You have a fever. You have a red line going up your leg. Summary Check your feet every day for blisters, cuts, bruises, sores, and redness. Apply a moisturizing lotion or petroleum jelly to the skin on your feet and to dry, brittle toenails. Wear shoes that fit properly and have enough cushioning. If you have foot problems, report any cuts, sores, or bruises to your health care provider immediately. Schedule a complete foot exam at least once a year (annually) or more often if you have foot problems. This information is not intended to replace advice given to you by your health care provider. Make sure you discuss any questions you have with your health care provider. Document Revised: 01/15/2020 Document Reviewed: 01/15/2020 Elsevier Patient Education  [place] A bunion (hallux valgus) is a bump that forms slowly on the inner side of the big toe joint. It occurs when the big toe turns toward the second toe. Bunions may be small at first, but they often get larger over time. They can make walking painful. What are the causes? This condition may be caused by: Wearing narrow or pointed shoes that force the big toe to press against the other toes. Abnormal foot development that causes the foot to roll inward. Changes in the foot that are caused by certain diseases, such as rheumatoid arthritis or polio. A foot injury. What increases the risk? The following factors may make you more likely to develop this condition: Wearing shoes that squeeze the toes together. Having certain diseases, such as: Rheumatoid arthritis. Polio. Cerebral palsy. Having family members who have bunions. Being born with abnormally shaped feet (a foot deformity), such as flat feet or low arches. Doing activities that put a lot of pressure on the feet,  such as ballet dancing. What are the signs or symptoms? The main symptom of this condition is a bump on your big toe that you can notice. Other symptoms may include: Pain. Redness and inflammation around your big toe. Thick or hardened skin on your big toe or between your toes. Stiffness or loss of motion in your big toe. Trouble with walking. How is this diagnosed? This condition may be diagnosed based on your symptoms, medical history, and activities. You may also have tests and imaging, such as: X-rays. These allow your health care provider to check the position of the bones in your foot and look for damage to your joint. They also help your health care provider determine the severity of your bunion and the best way to treat it. Joint aspiration. In this test, a sample of fluid is removed from the toe joint. This test may be done if you are in a lot of pain. It  helps rule out diseases that cause painful swelling of the joints, such as arthritis or gout. How is this treated? Treatment depends on the severity of your symptoms. The goal of treatment is to relieve symptoms and prevent your bunion from getting worse. Your health care provider may recommend: Wearing shoes that have a wide toe box, or using bunion pads to cushion the affected area. Taping your toes together to keep them in a normal position. Placing a device inside your shoe (orthotic device) to help reduce pressure on your toe joint. Taking medicine to ease pain and inflammation. Putting ice or heat on the affected area. Doing stretching exercises. Surgery, for severe cases. Follow these instructions at home: Managing pain, stiffness, and swelling   If directed, put ice on the painful area. To do this: Put ice in a plastic bag. Place a towel between your skin and the bag. Leave the ice on for 20 minutes, 2-3 times a day. Remove the ice if your skin turns bright red. This is very important. If you cannot feel pain, heat, or  cold, you have a greater risk of damage to the area. If directed, apply heat to the affected area before you exercise. Use the heat source that your health care provider recommends, such as a moist heat pack or a heating pad. Place a towel between your skin and the heat source. Leave the heat on for 20-30 minutes. Remove the heat if your skin turns bright red. This is especially important if you are unable to feel pain, heat, or cold. You have a greater risk of getting burned. General instructions Do exercises as told by your health care provider. Support your toe joint with proper footwear, shoe padding, or taping as told by your health care provider. Take over-the-counter and prescription medicines only as told by your health care provider. Do not use any products that contain nicotine or tobacco, such as cigarettes, e-cigarettes, and chewing tobacco. If you need help quitting, ask your health care provider. Keep all follow-up visits. This is important. Contact a health care provider if: Your symptoms get worse. Your symptoms do not improve in 2 weeks. Get help right away if: You have severe pain and trouble with walking. Summary A bunion is a bump on the inner side of the big toe joint that forms when the big toe turns toward the second toe. Bunions can make walking painful. Treatment depends on the severity of your symptoms. Support your toe joint with proper footwear, shoe padding, or taping as told by your health care provider. This information is not intended to replace advice given to you by your health care provider. Make sure you discuss any questions you have with your health care provider. Document Revised: 10/31/2019 Document Reviewed: 10/31/2019 Elsevier Patient Education  [place].   Diabetic Neuropathy Diabetic neuropathy refers to nerve damage that is caused by diabetes. Over time, people with diabetes can develop nerve damage throughout the body. There are  several types of diabetic neuropathy: Peripheral neuropathy. This is the most common type of diabetic neuropathy. It damages the nerves that carry signals between the spinal cord and other parts of the body (peripheral nerves). This usually affects nerves in the feet, legs, hands, and arms. Autonomic neuropathy. This type causes damage to nerves that control involuntary functions (autonomic nerves). Involuntary functions are functions of the body that you do not control. They include heartbeat, body temperature, blood pressure, urination, digestion, sweating, sexual function, or response to changes in  blood glucose. Focal neuropathy. This type of nerve damage affects one area of the body, such as an arm, a leg, or the face. The injury may involve one nerve or a small group of nerves. Focal neuropathy can be painful and unpredictable. It occurs most often in older adults with diabetes. This often develops suddenly, but usually improves over time and does not cause long-term problems. Proximal neuropathy. This type of nerve damage affects the nerves of the thighs, hips, buttocks, or legs. It causes severe pain, weakness, and muscle death (atrophy), usually in the thigh muscles. It is more common among older men and people who have type 2 diabetes. The length of recovery time may vary. What are the causes? Peripheral, autonomic, and focal neuropathies are caused by diabetes that is not well controlled with treatment. The cause of proximal neuropathy is not known, but it may be caused by inflammation related to uncontrolled blood glucose levels. What are the signs or symptoms? Peripheral neuropathy Peripheral neuropathy develops slowly over time. When the nerves of the feet and legs no longer work, you may experience: Burning, stabbing, or aching pain in the legs or feet. Pain or cramping in the legs or feet. Loss of feeling (numbness) and inability to feel pressure or pain in the feet. This can lead  to: Thick calluses or sores on areas of constant pressure. Ulcers. Reduced ability to feel temperature changes. Foot deformities. Muscle weakness. Loss of balance or coordination. Autonomic neuropathy The symptoms of autonomic neuropathy vary depending on which nerves are affected. Symptoms may include: Problems with digestion, such as: Nausea or vomiting. Poor appetite. Bloating. Diarrhea or constipation. Trouble swallowing. Losing weight without trying to. Problems with the heart, blood, and lungs, such as: Dizziness, especially when standing up. Fainting. Shortness of breath. Irregular heartbeat. Bladder problems, such as: Trouble starting or stopping urination. Leaking urine. Trouble emptying the bladder. Urinary tract infections (UTIs). Problems with other body functions, such as: Sweat. You may sweat too much or too little. Temperature. You might get hot easily. Or, you might feel cold more than usual. Sexual function. Men may not be able to get or maintain an erection. Women may have vaginal dryness and difficulty with arousal. Focal neuropathy Symptoms affect only one area of the body. Common symptoms include: Numbness. Tingling. Burning pain. Prickling feeling. Very sensitive skin. Weakness. Inability to move (paralysis). Muscle twitching. Muscles getting smaller (wasting). Poor coordination. Double or blurred vision. Proximal neuropathy Sudden, severe pain in the hip, thigh, or buttocks. Pain may spread from the back into the legs (sciatica). Pain and numbness in the arms and legs. Tingling. Loss of bladder control or bowel control. Weakness and wasting of thigh muscles. Difficulty getting up from a seated position. Abdominal swelling. Unexplained weight loss. How is this diagnosed? Diagnosis varies depending on the type of neuropathy your health care provider suspects. Peripheral neuropathy Your health care provider will do a neurologic exam. This  exam checks your reflexes, how you move, and what you can feel. You may have other tests, such as: Blood tests. Tests of the fluid that surrounds the spinal cord (lumbar puncture). CT scan. MRI. Checking the nerves that control muscles (electromyogram, or EMG). Checking how quickly signals pass through your nerves (nerve conduction study). Checking a small piece of a nerve using a microscope (biopsy). Autonomic neuropathy You may have tests, such as: Tests to measure your blood pressure and heart rate. You may be secured to an exam table that moves you from a lying  position to an upright position (table tilt test). Breathing tests to check your lungs. Tests to check how food moves through the digestive system (gastric emptying tests). Blood, sweat, or urine tests. Ultrasound of your bladder. Spinal fluid tests. Focal neuropathy This condition may be diagnosed with: A neurologic exam. CT scan. MRI. EMG. Nerve conduction study. Proximal neuropathy There is no test to diagnose this type of neuropathy. You may have tests to rule out other possible causes of this type of neuropathy. Tests may include: X-rays of your spine and lumbar region. Lumbar puncture. MRI. How is this treated? The goal of treatment is to keep nerve damage from getting worse. Treatment may include: Following your diabetes management plan. This will help keep your blood glucose level and your A1C level within your target range. This is the most important treatment. Using prescription pain medicine. Follow these instructions at home: Diabetes management Follow your diabetes management plan as told by your health care provider. Check your blood glucose levels. Keep your blood glucose in your target range. Have your A1C level checked at least two times a year, or as often as told. Take over the counter and prescription medicines only as told by your health care provider. This includes insulin and diabetes  medicine.  Lifestyle  Do not use any products that contain nicotine or tobacco, such as cigarettes, e-cigarettes, and chewing tobacco. If you need help quitting, ask your health care provider. Be physically active every day. Include strength training and balance exercises. Follow a healthy meal plan. Work with your health care provider to manage your blood pressure. General instructions Ask your health care provider if the medicine prescribed to you requires you to avoid driving or using machinery. Check your skin and feet every day for cuts, bruises, redness, blisters, or sores. Keep all follow-up visits. This is important. Contact a health care provider if: You have burning, stabbing, or aching pain in your legs or feet. You are unable to feel pressure or pain in your feet. You develop problems with digestion, such as: Nausea. Vomiting. Bloating. Constipation. Diarrhea. Abdominal pain. You have difficulty with urination, such as: Inability to control when you urinate (incontinence). Inability to completely empty the bladder (retention). You feel as if your heart is racing (palpitations). You feel dizzy, weak, or faint when you stand up. Get help right away if: You cannot urinate. You have sudden weakness or loss of coordination. You have trouble speaking. You have pain or pressure in your chest. You have an irregular heartbeat. You have sudden inability to move a part of your body. These symptoms may represent a serious problem that is an emergency. Do not wait to see if the symptoms will go away. Get medical help right away. Call your local emergency services (911 in the [place] Do not drive yourself to the hospital. Summary Diabetic neuropathy is nerve damage that is caused by diabetes. It can cause numbness and pain in the arms, legs, digestive tract, heart, and other body systems. This condition is treated by keeping your blood glucose level and your A1C level within your  target range. This can help prevent neuropathy from getting worse. Check your skin and feet every day for cuts, bruises, redness, blisters, or sores. Do not use any products that contain nicotine or tobacco, such as cigarettes, e-cigarettes, and chewing tobacco. This information is not intended to replace advice given to you by your health care provider. Make sure you discuss any questions you have with your health care  provider. Document Revised: 11/06/2019 Document Reviewed: 11/06/2019 Elsevier Patient Education  [place].

## 2021-06-13 NOTE — Progress Notes (Addendum)
I called Ms [name]'s number several times- the phone rang and range- no voice mail.I called this number x 3. I called her friend's number- the phone range and disconnected.  I left a message on patient SW;s number.  I received a call in from from Denise Loveless, Ms [name]'s, case worker, who is with Ms [name] and saw the missed calls from me. I was able to speak with Ms [name] and [name] and update chart.   Ms [name] denies chest pain or shortness of breath. Patient denies having any s/s of Covid in her household.  Patient denies any known exposure to Covid.  Ms [name] tested negative for Covid on 06/09/21.  Ms [name] has mental retardation, she is her own guardian. Ms [name] has sleep apnea, she does not wear CPAP every night, I asked patient to bring the mask for CPAP with her tomorrow.   Ms [name] has type II diabetes, not on Medications, last Hgb A1C was drawn on 06/09/21 - it was 6.1  Ms [name] has a CBG, she does not check CBG often and sometimes there is a problem with the CBG. I instructed patient to check CBG after awaking. I Instructed patient if CBG is less than 70 to drink 1/2 cup of a clear juice. Recheck CBG in 15 minutes if CBG is not over 70 call, pre- op desk at [phone] for further instructions. [name] asked patient if she would do that in am, Ms [name] said she will.  Ms [name]'s medications are all in a blister pack, patient can not identify which medication is what.  I instructed patient to not to take any medications in am.  I instructed Ms [name] to shower with antibiotic soap, if it is available.  Dry off with a clean towel. Do not put lotion, powder, cologne or deodorant or makeup.No jewelry or piercings. Men may shave their face and neck. Woman should not shave. No nail polish, artificial or acrylic nails. Wear clean clothes, brush your teeth. Glasses, contact lens,dentures or partials may not be worn in the OR. If you need to wear them, please bring a case for glasses, do not wear  contacts or bring a case, the hospital does not have contact cases, dentures or partials will have to be removed , make sure they are clean, we will provide a denture cup to put them in. You will need some one to drive you home and a responsible person over the age of 61 to stay with you for the first 24 hours after surgery.   Ms [name] does not have anyone to stay with her for the 1st 24 hours after procedure. I sent Dr. Valeta Bryant a staff message with this information. Someone from [employer] will come back on Tuesday for patient.

## 2021-06-14 ENCOUNTER — Encounter (HOSPITAL_COMMUNITY): Payer: Self-pay | Admitting: Pulmonary Disease

## 2021-06-14 ENCOUNTER — Other Ambulatory Visit: Payer: Self-pay

## 2021-06-14 ENCOUNTER — Encounter (HOSPITAL_COMMUNITY)
Admission: RE | Disposition: A | Discharge status: Home / Self Care | Payer: Self-pay | Source: Home / Self Care | Attending: Pulmonary Disease

## 2021-06-14 ENCOUNTER — Ambulatory Visit (HOSPITAL_COMMUNITY): Payer: Medicare Other | Admitting: Anesthesiology

## 2021-06-14 ENCOUNTER — Observation Stay (HOSPITAL_COMMUNITY)
Admission: RE | Admit: 2021-06-14 | Discharge: 2021-06-15 | Disposition: A | Discharge status: Home / Self Care | Payer: Medicare Other | Attending: Pulmonary Disease | Admitting: Pulmonary Disease

## 2021-06-14 ENCOUNTER — Ambulatory Visit (HOSPITAL_COMMUNITY): Payer: Medicare Other

## 2021-06-14 DIAGNOSIS — R918 Other nonspecific abnormal finding of lung field: Secondary | ICD-10-CM | POA: Diagnosis not present

## 2021-06-14 DIAGNOSIS — E119 Type 2 diabetes mellitus without complications: Secondary | ICD-10-CM | POA: Diagnosis not present

## 2021-06-14 DIAGNOSIS — Z9889 Other specified postprocedural states: Secondary | ICD-10-CM

## 2021-06-14 DIAGNOSIS — F17210 Nicotine dependence, cigarettes, uncomplicated: Secondary | ICD-10-CM | POA: Diagnosis not present

## 2021-06-14 DIAGNOSIS — G43909 Migraine, unspecified, not intractable, without status migrainosus: Secondary | ICD-10-CM | POA: Diagnosis not present

## 2021-06-14 DIAGNOSIS — H409 Unspecified glaucoma: Secondary | ICD-10-CM | POA: Diagnosis not present

## 2021-06-14 DIAGNOSIS — Z79899 Other long term (current) drug therapy: Secondary | ICD-10-CM | POA: Insufficient documentation

## 2021-06-14 DIAGNOSIS — G4733 Obstructive sleep apnea (adult) (pediatric): Secondary | ICD-10-CM | POA: Diagnosis not present

## 2021-06-14 DIAGNOSIS — I10 Essential (primary) hypertension: Secondary | ICD-10-CM | POA: Diagnosis not present

## 2021-06-14 DIAGNOSIS — J441 Chronic obstructive pulmonary disease with (acute) exacerbation: Secondary | ICD-10-CM

## 2021-06-14 DIAGNOSIS — R911 Solitary pulmonary nodule: Secondary | ICD-10-CM | POA: Diagnosis not present

## 2021-06-14 DIAGNOSIS — E876 Hypokalemia: Secondary | ICD-10-CM | POA: Diagnosis not present

## 2021-06-14 DIAGNOSIS — C342 Malignant neoplasm of middle lobe, bronchus or lung: Secondary | ICD-10-CM | POA: Diagnosis not present

## 2021-06-14 DIAGNOSIS — Z419 Encounter for procedure for purposes other than remedying health state, unspecified: Secondary | ICD-10-CM

## 2021-06-14 HISTORY — PX: VIDEO BRONCHOSCOPY WITH RADIAL ENDOBRONCHIAL ULTRASOUND: SHX6849

## 2021-06-14 HISTORY — PX: BRONCHIAL BIOPSY: SHX5109

## 2021-06-14 HISTORY — PX: BRONCHIAL BRUSHINGS: SHX5108

## 2021-06-14 HISTORY — PX: FIDUCIAL MARKER PLACEMENT: SHX6858

## 2021-06-14 HISTORY — PX: BRONCHIAL NEEDLE ASPIRATION BIOPSY: SHX5106

## 2021-06-14 LAB — CBC
HCT: 40.7 % (ref 36.0–46.0)
Hemoglobin: 13.1 g/dL (ref 12.0–15.0)
MCH: 29.8 pg (ref 26.0–34.0)
MCHC: 32.2 g/dL (ref 30.0–36.0)
MCV: 92.7 fL (ref 80.0–100.0)
Platelets: 187 10*3/uL (ref 150–400)
RBC: 4.39 MIL/uL (ref 3.87–5.11)
RDW: 14.6 % (ref 11.5–15.5)
WBC: 4.7 10*3/uL (ref 4.0–10.5)
nRBC: 0 % (ref 0.0–0.2)

## 2021-06-14 LAB — GLUCOSE, CAPILLARY
Glucose-Capillary: 125 mg/dL — ABNORMAL HIGH (ref 70–99)
Glucose-Capillary: 99 mg/dL (ref 70–99)
Glucose-Capillary: 105 mg/dL — ABNORMAL HIGH (ref 70–99)
Glucose-Capillary: 92 mg/dL (ref 70–99)

## 2021-06-14 LAB — SURGICAL PCR SCREEN
MRSA, PCR: NEGATIVE
Staphylococcus aureus: POSITIVE — AB

## 2021-06-14 SURGERY — BRONCHOSCOPY, WITH BIOPSY USING ELECTROMAGNETIC NAVIGATION
Anesthesia: General | Laterality: Bilateral

## 2021-06-14 MED ORDER — TRAZODONE HCL 100 MG PO TABS
100.0000 mg | ORAL_TABLET | Freq: Every evening | ORAL | Status: DC | PRN
  Administered 2021-06-14: 100 mg via ORAL
  Filled 2021-06-14: qty 1

## 2021-06-14 MED ORDER — FENTANYL CITRATE (PF) 250 MCG/5ML IJ SOLN: INTRAMUSCULAR | Status: DC | PRN

## 2021-06-14 MED ORDER — CHLORHEXIDINE GLUCONATE CLOTH 2 % EX PADS
6.0000 | MEDICATED_PAD | Freq: Every day | CUTANEOUS | Status: DC
  Administered 2021-06-14: 6 via TOPICAL

## 2021-06-14 MED ORDER — LAMOTRIGINE 100 MG PO TABS
100.0000 mg | ORAL_TABLET | Freq: Two times a day (BID) | ORAL | Status: DC
  Administered 2021-06-14 – 2021-06-15 (×2): 100 mg via ORAL
  Filled 2021-06-14 (×2): qty 1

## 2021-06-14 MED ORDER — ONDANSETRON HCL 4 MG/2ML IJ SOLN
INTRAMUSCULAR | Status: DC | PRN
  Administered 2021-06-14: 4 mg via INTRAVENOUS

## 2021-06-14 MED ORDER — CHLORHEXIDINE GLUCONATE 0.12 % MT SOLN
OROMUCOSAL | Status: AC
  Administered 2021-06-14: 15 mL via OROMUCOSAL
  Filled 2021-06-14: qty 15

## 2021-06-14 MED ORDER — LIDOCAINE 2% (20 MG/ML) 5 ML SYRINGE
INTRAMUSCULAR | Status: DC | PRN
Start: 2021-06-14 — End: 2021-06-14
  Administered 2021-06-14: 60 mg via INTRAVENOUS

## 2021-06-14 MED ORDER — SERTRALINE HCL 100 MG PO TABS
100.0000 mg | ORAL_TABLET | Freq: Every day | ORAL | Status: DC
  Administered 2021-06-14 – 2021-06-15 (×2): 100 mg via ORAL
  Filled 2021-06-14 (×2): qty 1

## 2021-06-14 MED ORDER — SUGAMMADEX SODIUM 200 MG/2ML IV SOLN
INTRAVENOUS | Status: DC | PRN
  Administered 2021-06-14: 400 mg via INTRAVENOUS

## 2021-06-14 MED ORDER — ONDANSETRON HCL 4 MG PO TABS: 4.0000 mg | ORAL_TABLET | Freq: Four times a day (QID) | ORAL | Status: DC | PRN

## 2021-06-14 MED ORDER — PROPRANOLOL HCL 10 MG PO TABS
10.0000 mg | ORAL_TABLET | Freq: Three times a day (TID) | ORAL | Status: DC
  Administered 2021-06-14: 10 mg via ORAL
  Filled 2021-06-14 (×2): qty 1

## 2021-06-14 MED ORDER — BENZTROPINE MESYLATE 1 MG PO TABS
1.0000 mg | ORAL_TABLET | Freq: Every day | ORAL | Status: DC
  Administered 2021-06-14: 1 mg via ORAL
  Filled 2021-06-14 (×2): qty 1

## 2021-06-14 MED ORDER — ROCURONIUM BROMIDE 10 MG/ML (PF) SYRINGE
PREFILLED_SYRINGE | INTRAVENOUS | Status: DC | PRN
  Administered 2021-06-14: 100 mg via INTRAVENOUS

## 2021-06-14 MED ORDER — RACEPINEPHRINE HCL 2.25 % IN NEBU
0.5000 mL | INHALATION_SOLUTION | Freq: Once | RESPIRATORY_TRACT | Status: DC | PRN
  Filled 2021-06-14: qty 0.5

## 2021-06-14 MED ORDER — PROPOFOL 10 MG/ML IV BOLUS
INTRAVENOUS | Status: DC | PRN
  Administered 2021-06-14: 180 mg via INTRAVENOUS

## 2021-06-14 MED ORDER — ALBUTEROL SULFATE (2.5 MG/3ML) 0.083% IN NEBU
2.5000 mg | INHALATION_SOLUTION | RESPIRATORY_TRACT | Status: DC | PRN
  Administered 2021-06-14: 2.5 mg via RESPIRATORY_TRACT
  Filled 2021-06-14 (×2): qty 3

## 2021-06-14 MED ORDER — DOCUSATE SODIUM 100 MG PO CAPS: 100.0000 mg | ORAL_CAPSULE | Freq: Two times a day (BID) | ORAL | Status: DC | PRN

## 2021-06-14 MED ORDER — MUPIROCIN 2 % EX OINT
1.0000 "application " | TOPICAL_OINTMENT | Freq: Two times a day (BID) | CUTANEOUS | Status: DC
  Administered 2021-06-14: 1 via NASAL
  Filled 2021-06-14: qty 22

## 2021-06-14 MED ORDER — PALIPERIDONE ER 6 MG PO TB24
9.0000 mg | ORAL_TABLET | Freq: Every day | ORAL | Status: DC
  Administered 2021-06-14: 9 mg via ORAL
  Filled 2021-06-14 (×2): qty 1

## 2021-06-14 MED ORDER — FENTANYL CITRATE (PF) 100 MCG/2ML IJ SOLN
INTRAMUSCULAR | Status: DC | PRN
  Administered 2021-06-14: 100 ug via INTRAVENOUS

## 2021-06-14 MED ORDER — FENTANYL CITRATE (PF) 100 MCG/2ML IJ SOLN: 25.0000 ug | INTRAMUSCULAR | Status: DC | PRN

## 2021-06-14 MED ORDER — ATORVASTATIN CALCIUM 10 MG PO TABS
10.0000 mg | ORAL_TABLET | Freq: Every day | ORAL | Status: DC
  Administered 2021-06-14 – 2021-06-15 (×2): 10 mg via ORAL
  Filled 2021-06-14 (×2): qty 1

## 2021-06-14 MED ORDER — PANTOPRAZOLE SODIUM 40 MG PO TBEC
40.0000 mg | DELAYED_RELEASE_TABLET | Freq: Every day | ORAL | Status: DC
  Administered 2021-06-14 – 2021-06-15 (×2): 40 mg via ORAL
  Filled 2021-06-14 (×2): qty 1

## 2021-06-14 MED ORDER — PROPRANOLOL HCL 10 MG PO TABS
10.0000 mg | ORAL_TABLET | Freq: Once | ORAL | Status: AC
  Administered 2021-06-14: 10 mg via ORAL
  Filled 2021-06-14: qty 1

## 2021-06-14 MED ORDER — DEXAMETHASONE SODIUM PHOSPHATE 10 MG/ML IJ SOLN
INTRAMUSCULAR | Status: DC | PRN
  Administered 2021-06-14: 10 mg via INTRAVENOUS

## 2021-06-14 MED ORDER — CHLORHEXIDINE GLUCONATE 0.12 % MT SOLN
15.0000 mL | Freq: Once | OROMUCOSAL | Status: AC
  Filled 2021-06-14: qty 15

## 2021-06-14 MED ORDER — LACTATED RINGERS IV SOLN: INTRAVENOUS | Status: DC

## 2021-06-14 SURGICAL SUPPLY — 1 items: superlock covidien ×1 IMPLANT

## 2021-06-14 NOTE — H&P (Addendum)
NAME:  [name], MRN:  [id number], DOB:  1961-05-22, LOS: 0 ADMISSION DATE:  06/14/2021, CONSULTATION DATE:  06/14/2021 REFERRING MD:  NA, CHIEF COMPLAINT:   Lung Nodule, post bronchoscopy   History of Present Illness:  60 year old female with history significant for current tobacco abuse, OSA on CPAP, GERD, HTN, and DM with complaints of exertional shortness of breath.    She has been followed previously long term by Dr. Servando Snare for a known right middle lobe lung nodule, which is macrolobulated and adjacent to right heart border.  Recent PET scan on 04/01/21 that showed avid uptake within the right heart border.  Unclear if if nodule is actually hypermetabolic or whether it correlates with nodule seen on CT versus overlay body divergence.  There was some question of it being a possible AVM, however chest CTA on 05/26/21 showed no enhancement after IV contrast to suggest pulmonary AVM.  She was taken for a robotic assisted navigational bronchoscopy with biopsy under general anesthesia with Dr. Valeta Harms on 12/6.  She is to be admitted overnight for observation as she lives alone.  Preliminary pathology felt to be malignant.  A single fiducial was placed in proximity of lung lesion post biopsy.    Pertinent  Medical History  Tobacco abuse, OSA on CPAP, DM, GERD, HTN, MI/ CAD, stroke, migraines, IBS, arthritis, depression/ anxiety, obesity     Significant Hospital Events: Including procedures, antibiotic start and stop dates in addition to other pertinent events   12/6  Robotic assisted navigational bronchoscopy w/ biopsy and single fiducial by Dr. Valeta Harms, admitted to obs overnight   Interim History / Subjective:  Laryngospasm post extubation, waking up, oxygenating well , in NAD Sats 99% on 8 L simple mask in PACU. RR 22, HR 93  Objective   Blood pressure (!) 135/55, pulse 71, temperature 97.7 F (36.5 C), temperature source Oral, resp. rate 17, height 5' 10.5" (1.791 m), weight 104.3 kg,  SpO2 93 %.       No intake or output data in the 24 hours ending 06/14/21 1258 Filed Weights   06/14/21 0923 06/14/21 0946  Weight: 104.3 kg 104.3 kg   Chest Imaging  06/14/2021 CXR The heart size and mediastinal contours are within normal limits. Nodular opacity of the medial segment right middle lobe with biopsy marking clip. No pneumothorax. The visualized skeletal structures are unremarkable.   IMPRESSION: 1. Nodular opacity of the medial segment right middle lobe with biopsy marking clip, better demonstrated by prior CT.   2.  No pneumothorax.  03/04/2021 CT chest: Patient with right middle lobe pulmonary nodule slowly enlarging over time could represent indolent neoplasm it is macrolobulated associated preclosed to the vessels.  There was a concern whether or not this could be a AVM.   04/01/2021: Nuclear medicine pet imaging with PET avid uptake on the right heart border adjacent to the same area as the nodule.  There could be some CT PET scan divergence on the overlay.  The hypermetabolic area could represent where the nodules located at does potentially appear this way. May be dealing with a indolent neoplasm.  Examination: General: Obese female waking up from anesthesia, following commands, coughing at intervals, in NAD HENT: Thick neck, No LAD or JVD noted Lungs: Bilateral chest excursion , Coarse and diminished per bases Cardiovascular: S1, S2, RRR, No RMG, SR per tele Abdomen: Obese, Soft, ND, NT, BS quiet Extremities: No obvious deformities, warm to touch Neuro: Groggy, following simple commands , MAE  x 4 GU: NA  Resolved Hospital Problem list     Assessment & Plan:  Post Bronchoscopy for biopsy of PET avid right middle lobe lung nodule that  is macrolobulated and adjacent to the right heart border. Single fiducial was placed in proximity of lung lesion post biopsy.   Plan CXR in PACU and in am  Admit to Observation  x 23 hours, as patient lives alone   Monitor for hemoptysis, bronchospasm, facial crepitus, hypoxemia, , hemorrhage or chest tightness Oxygen to Maintain oxygen saturations 88-92% Monitor  oxygen saturations  Albuterol nebs Q 4 prn for bronchospasm CBC in am  Follow Cytology, + malignant in room Will need referral to radiation oncology / medical oncology  as she is questionable for surgical intervention as she continues to smoker and walks with a walker. ( She has been evaluated by Dr. Kipp Brood)  Will need discharge 12/7 am.  Mild Laryngeal spasm on extubation post procedure No stridor on exam in PACU Plan Racemic Epi x 1 prn if needed for further laryngeal bronchospasm/ stridor  OSA on CPAP at home Plan No CPAP tonight  If patient develops distress, please call PCCM to evaluate   Tobacco Abuse Plan Ongoing smoking cessation counseling  Home Meds Plan Will continue appropriate home medications overnight.    Best Practice (right click and "Reselect all SmartList Selections" daily)   Diet/type: clear liquids DVT prophylaxis: SCD GI prophylaxis: PPI Lines: N/A Foley:  N/A Code Status:  full code Last date of multidisciplinary goals of care discussion Pt. Social worker update.   Labs   CBC: Recent Labs  Lab 06/14/21 0850  WBC 4.7  HGB 13.1  HCT 40.7  MCV 92.7  PLT 545    Basic Metabolic Panel: Recent Labs  Lab 06/09/21 1111  NA 140  K 4.3  CL 97  CO2 29  GLUCOSE 81  BUN 7  CREATININE 0.83  CALCIUM 9.5   GFR: Estimated Creatinine Clearance: 97.4 mL/min (by C-G formula based on SCr of 0.83 mg/dL). Recent Labs  Lab 06/14/21 0850  WBC 4.7    Liver Function Tests: No results for input(s): AST, ALT, ALKPHOS, BILITOT, PROT, ALBUMIN in the last 168 hours. No results for input(s): LIPASE, AMYLASE in the last 168 hours. No results for input(s): AMMONIA in the last 168 hours.  ABG    Component Value Date/Time   HCO3 (H) 06/19/2007 1834    27.1 PATIENT IDENTIFICATION ERROR. PLEASE  DISREGARD RESULTS. ACCOUNT WILL BE CREDITED.   TCO2 29 04/22/2012 1210     Coagulation Profile: No results for input(s): INR, PROTIME in the last 168 hours.  Cardiac Enzymes: No results for input(s): CKTOTAL, CKMB, CKMBINDEX, TROPONINI in the last 168 hours.  HbA1C: Hgb A1c MFr Bld  Date/Time Value Ref Range Status  06/09/2021 11:11 AM 6.1 (H) 4.8 - 5.6 % Final    Comment:             Prediabetes: 5.7 - 6.4          Diabetes: >6.4          Glycemic control for adults with diabetes: <7.0   03/03/2021 11:01 AM 6.3 (H) 4.8 - 5.6 % Final    Comment:             Prediabetes: 5.7 - 6.4          Diabetes: >6.4          Glycemic control for adults with diabetes: <7.0     CBG:  Recent Labs  Lab 06/14/21 0949 06/14/21 1152  GLUCAP 125* 99    Review of Systems:   Unable as sedated and awakening from anesthesia   Past Medical History:  She,  has a past medical history of Anxiety, Aortic atherosclerosis ([place]) (09/19/2020), Arthritis, Depression, Diabetes mellitus, Diabetes mellitus, type II ([place]), Dyspnea, Dysrhythmia, GERD (gastroesophageal reflux disease), Heart murmur, HTN (hypertension), Hyperglycemia, IBS (irritable bowel syndrome), Lung nodules, Mental retardation, MI (myocardial infarction) ([place]), Migraines, Pneumonia, Sleep apnea, and Stroke ([place]).   Surgical History:   Past Surgical History:  Procedure Laterality Date   ABDOMINAL HYSTERECTOMY     BIOPSY  01/15/2020   Procedure: BIOPSY;  Surgeon: Daneil Dolin, MD;  [facility]: [facility];  Service: Endoscopy;;  gastric   COLONOSCOPY  11/2007   hyperplastic polyps, prior hx of adenomas    COLONOSCOPY  05/2010   incomplete due to poor prep, hyperplastic rectal polyp   COLONOSCOPY  05/05/2002   Dimunitive polyps in the rectum and left colon, cold    biopsied/removed.  Scattered few left-sided diverticula.  Regular colonic   mucosa appeared normal   COLONOSCOPY N/A 05/28/2013   Rourk: mulitple tubular adenomas removed.  next tcs 05/2016   COLONOSCOPY WITH PROPOFOL N/A 09/07/2016   Dr. Gala Romney: For hyperplastic polyps removed. Next colonoscopy March 2023 given history of adenomatous colon polyps in the past.   ESOPHAGOGASTRODUODENOSCOPY  08/2007   moderate sized hiatal hernia   ESOPHAGOGASTRODUODENOSCOPY  05/2010   noncritical schatzki ring s/p 22F   ESOPHAGOGASTRODUODENOSCOPY (EGD) WITH ESOPHAGEAL DILATION N/A 02/06/2013   WUJ:WJXBJY esophagus-s/p dilation up to a 34 [place] size with [facility] dilators.  Hiatal hernia   ESOPHAGOGASTRODUODENOSCOPY (EGD) WITH PROPOFOL N/A 09/07/2016   Dr. Gala Romney: Normal, status post empiric dilation of the esophagus for history of dysphagia   ESOPHAGOGASTRODUODENOSCOPY (EGD) WITH PROPOFOL N/A 05/02/2019   Normal esophagus s/p dilation, normal stomach, normal duodenum   ESOPHAGOGASTRODUODENOSCOPY (EGD) WITH PROPOFOL N/A 01/15/2020   normal esophagus s/p dilation, gastric nodule s/p biopsy. This showed reactive gastropathy with H.pylori.    EXTERNAL EAR SURGERY     bilateral   FOOT SURGERY     GLAUCOMA SURGERY     KNEE ARTHROPLASTY Right 11/04/2020   Procedure: COMPUTER ASSISTED TOTAL KNEE ARTHROPLASTY;  Surgeon: Rod Can, MD;  Location: [facility];  Service: Orthopedics;  Laterality: Right;   MALONEY DILATION N/A 09/07/2016   Procedure: Venia Minks DILATION;  Surgeon: Daneil Dolin, MD;  [facility];  Service: Endoscopy;  Laterality: N/A;   MALONEY DILATION N/A 05/02/2019   Procedure: Venia Minks DILATION;  Surgeon: Daneil Dolin, MD;  [facility];  Service: Endoscopy;  Laterality: N/A;   MALONEY DILATION N/A 01/15/2020   Procedure: Venia Minks DILATION;  Surgeon: Daneil Dolin, MD;  [facility];  Service: Endoscopy;  Laterality: N/A;   POLYPECTOMY  09/07/2016   Procedure: POLYPECTOMY;  Surgeon: Daneil Dolin, MD;  [facility];  Service: Endoscopy;;  sigmoid colon x4   small bowel capsule  10/2007   normal     Social History:   reports that she  has been smoking cigarettes. She has a 20.00 pack-year smoking history. She has never used smokeless tobacco. She reports that she does not drink alcohol and does not use drugs.   Family History:  Her family history includes Alcohol abuse in an other family member; Anxiety disorder in some other family members; COPD in an other family member; Colon cancer in an other family  member; Depression in her sister and another family member; Diabetes type II in an other family member; Drug abuse in an other family member; GER disease in an other family member; Heart attack in her father; Obesity in an other family member; Schizophrenia in her sister and another family member; Stroke in her mother. There is no history of Liver disease or Inflammatory bowel disease.   Allergies Allergies  Allergen Reactions   Thorazine [Chlorpromazine Hcl] Anaphylaxis   Acetaminophen Other (See Comments)    Makes pt dizzy   Aspirin Other (See Comments)    seizure   Aspirin-Acetaminophen-Caffeine Other (See Comments)    seizure   Nsaids Nausea And Vomiting   Other     Acidic foods   Penicillins Nausea And Vomiting    Has patient had a PCN reaction causing immediate rash, facial/tongue/throat swelling, SOB or lightheadedness with hypotension:Yes Has patient had a PCN reaction causing severe rash involving mucus membranes or skin necrosis:Yes Has patient had a PCN reaction that required hospitalization:Yes Has patient had a PCN reaction occurring within the last 10 years:No If all of the above answers are "NO", then may proceed with Cephalosporin use.    Tomato Rash     Home Medications  Prior to Admission medications   Medication Sig Start Date End Date Taking? Authorizing Provider  atorvastatin (LIPITOR) 10 MG tablet TAKE 1 TABLET BY MOUTH ONCE A DAY. 10/25/20  Yes Luking, Elayne Snare, MD  benztropine (COGENTIN) 1 MG tablet Take 1 tablet (1 mg total) by mouth daily. 02/15/21  Yes Cloria Spring, MD  cycloSPORINE  (RESTASIS) 0.05 % ophthalmic emulsion Place 1 drop into both eyes 2 (two) times daily.   Yes [provider]  dicyclomine (BENTYL) 10 MG capsule TAKE 1 CAPSULE BY MOUTH UP TO 3 TIMES DAILY BEFORE MEALS AS NEEDED FOR ABDOMINAL CRAMPS/LOOSE STOOLS. 04/27/21  Yes Annitta Needs, NP  docusate sodium (COLACE) 100 MG capsule TAKE 1 CAPSULE BY MOUTH TWICE DAILY 03/24/21  Yes Kathyrn Drown, MD  lamoTRIgine (LAMICTAL) 100 MG tablet Take 1 tablet (100 mg total) by mouth 2 (two) times daily. 02/15/21  Yes Cloria Spring, MD  ondansetron (ZOFRAN) 4 MG tablet Take 1 tablet (4 mg total) by mouth every 6 (six) hours as needed for nausea. 11/09/20  Yes Cherlynn June B, PA  paliperidone (INVEGA) 9 MG 24 hr tablet Take 1 tablet (9 mg total) by mouth daily. 02/15/21  Yes Cloria Spring, MD  potassium chloride (KLOR-CON) 10 MEQ tablet TAKE 2 TABLETS (20MEQ) BY MOUTH TWICE DAILY WITH FOOD 12/21/20  Yes Kathyrn Drown, MD  propranolol (INDERAL) 10 MG tablet Take 1 tablet (10 mg total) by mouth 3 (three) times daily. 02/24/21  Yes Kathyrn Drown, MD  RABEprazole (ACIPHEX) 20 MG tablet Take 1 tablet (20 mg total) by mouth 2 (two) times daily before a meal. 02/17/21  Yes Harper, Kristen S, PA-C  senna (SENNA-TABS) 8.6 MG tablet TAKE 1 TABLET BY MOUTH 2 TIMES A DAY 03/24/21  Yes Luking, Scott A, MD  sertraline (ZOLOFT) 100 MG tablet TAKE 1 TABLET BY MOUTH ONCE A DAY. 05/20/21  Yes Cloria Spring, MD  torsemide (DEMADEX) 20 MG tablet TAKE (1) TABLET BY MOUTH EACH MORNING. 10/25/20  Yes Kathyrn Drown, MD  traZODone (DESYREL) 100 MG tablet TAKE 2 TABLETS(200MG) BY MOUTH AT BEDTIME. 05/20/21  Yes Cloria Spring, MD  blood glucose meter kit and supplies Dispense based on patient and insurance preference. Use to  test glucose once daily. (FOR ICD10, E11.9). 03/24/20   Kathyrn Drown, MD  loperamide (IMODIUM) 2 MG capsule Take 2 mg by mouth 4 (four) times daily as needed for diarrhea or loose stools. 02/04/21   [provider]         Magdalen Spatz, MSN, AGACNP-BC [place] for personal pager PCCM on call pager [phone]  06/14/2021 2:57 PM

## 2021-06-14 NOTE — Progress Notes (Signed)
Discharge planning: Please call Edgar Frisk, DSS case worker for patient. Her number [phone]. She will be taking the patient home when ready for discharge.

## 2021-06-14 NOTE — Transfer of Care (Signed)
Immediate Anesthesia Transfer of Care Note  Patient: [name]  Procedure(s) Performed: ROBOTIC ASSISTED NAVIGATIONAL BRONCHOSCOPY (Bilateral) BRONCHIAL BIOPSIES BRONCHIAL BRUSHINGS BRONCHIAL NEEDLE ASPIRATION BIOPSIES VIDEO BRONCHOSCOPY WITH RADIAL ENDOBRONCHIAL ULTRASOUND FIDUCIAL MARKER PLACEMENT  Patient Location: PACU  Anesthesia Type:General  Level of Consciousness: awake, alert  and oriented  Airway & Oxygen Therapy: Patient Spontanous Breathing and Patient connected to face mask oxygen  Post-op Assessment: Report given to RN and Post -op Vital signs reviewed and stable  Post vital signs: Reviewed and stable  Last Vitals:  Vitals Value Taken Time  BP 142/101 06/14/21 1322  Temp 36.3 C 06/14/21 1320  Pulse 89 06/14/21 1325  Resp 21 06/14/21 1325  SpO2 99 % 06/14/21 1325  Vitals shown include unvalidated device data.  Last Pain:  Vitals:   06/14/21 0946  TempSrc: Oral         Complications: No notable events documented.

## 2021-06-14 NOTE — Discharge Instructions (Addendum)
Flexible Bronchoscopy, Care After This sheet gives you information about how to care for yourself after your test. Your doctor may also give you more specific instructions. If you have problems or questions, contact your doctor. Follow these instructions at home: Eating and drinking Do not eat or drink anything (not even water) for 2 hours after your test, or until your numbing medicine (local anesthetic) wears off. When your numbness is gone and your cough and gag reflexes have come back, you may: Eat only soft foods. Slowly drink liquids. The day after the test, go back to your normal diet. Driving Do not drive for 24 hours if you were given a medicine to help you relax (sedative). Do not drive or use heavy machinery while taking prescription pain medicine. General instructions  Take over-the-counter and prescription medicines only as told by your doctor. Return to your normal activities as told. Ask what activities are safe for you. Do not use any products that have nicotine or tobacco in them. This includes cigarettes and e-cigarettes. If you need help quitting, ask your doctor. Keep all follow-up visits as told by your doctor. This is important. It is very important if you had a tissue sample (biopsy) taken. Get help right away if: You have shortness of breath that gets worse. You get light-headed. You feel like you are going to pass out (faint). You have chest pain. You cough up: More than a little blood. More blood than before. Summary Do not eat or drink anything (not even water) for 2 hours after your test, or until your numbing medicine wears off. Do not use cigarettes. Do not use e-cigarettes. Get help right away if you have chest pain.  This information is not intended to replace advice given to you by your health care provider. Make sure you discuss any questions you have with your health care provider. Document Released: 04/23/2009 Document Revised: 06/08/2017 Document  Reviewed: 07/14/2016 Elsevier Patient Education  2020 [place]. Follow up with Eric Form NP 06-22-21 at 3:00 pm

## 2021-06-14 NOTE — Op Note (Signed)
Video Bronchoscopy with Robotic Assisted Bronchoscopic Navigation   Date of Operation: 06/14/2021   Pre-op Diagnosis: Right middle lobe lung nodule  Post-op Diagnosis: Right middle lobe lung nodule  Surgeon: Garner Nash, DO  Assistants: None   Anesthesia: General endotracheal anesthesia  Operation: Flexible video fiberoptic bronchoscopy with robotic assistance and biopsies.  Estimated Blood Loss: Minimal  Complications: None  Indications and History: [name] is a 60 y.o. female with history of right middle lobe lung nodule. The risks, benefits, complications, treatment options and expected outcomes were discussed with the patient.  The possibilities of pneumothorax, pneumonia, reaction to medication, pulmonary aspiration, perforation of a viscus, bleeding, failure to diagnose a condition and creating a complication requiring transfusion or operation were discussed with the patient who freely signed the consent.    Description of Procedure: The patient was seen in the Preoperative Area, was examined and was deemed appropriate to proceed.  The patient was taken to [facility] endoscopy room 3, identified as [name] and the procedure verified as Flexible Video Fiberoptic Bronchoscopy.  A Time Out was held and the above information confirmed.   Prior to the date of the procedure a high-resolution CT scan of the chest was performed. Utilizing ION software program a virtual tracheobronchial tree was generated to allow the creation of distinct navigation pathways to the patient's parenchymal abnormalities. After being taken to the operating room general anesthesia was initiated and the patient  was orally intubated. The video fiberoptic bronchoscope was introduced via the endotracheal tube and a general inspection was performed which showed normal right and left lung anatomy, aspiration of the bilateral mainstems was completed to remove any remaining secretions. Robotic catheter inserted  into patient's endotracheal tube.   Target #1 right middle lobe: The distinct navigation pathways prepared prior to this procedure were then utilized to navigate to patient's lesion identified on CT scan. The robotic catheter was secured into place and the vision probe was withdrawn.  Lesion location was approximated using fluoroscopy, radial endobronchial ultrasound, and three-dimensional cone beam CT imaging for peripheral targeting. Under fluoroscopic guidance transbronchial needle brushings, transbronchial needle biopsies, and transbronchial forceps biopsies were performed to be sent for cytology and pathology.  Following tissue sampling a single fiducial was placed in proximity of the lesion using the fiducial catheter and wire delivery kit under fluoroscopy.  At the end of the procedure a general airway inspection was performed and there was no evidence of active bleeding. The bronchoscope was removed.  The patient tolerated the procedure well. There was no significant blood loss and there were no obvious complications. A post-procedural chest x-ray is pending.  Samples Target #1: 1. Transbronchial needle brushings from right middle lobe 2. Transbronchial Wang needle biopsies from right middle lobe 3. Transbronchial forceps biopsies from right middle lobe  Plans:  The patient will be discharged from the PACU to home when recovered from anesthesia and after chest x-ray is reviewed. We will review the cytology, pathology and microbiology results with the patient when they become available. Outpatient followup will be with Garner Nash, DO .  Garner Nash, DO [facility] 06/14/2021 1:09 PM       Needle in lesion on CBCT imaging

## 2021-06-14 NOTE — Anesthesia Procedure Notes (Signed)
Procedure Name: Intubation Date/Time: 06/14/2021 12:13 PM Performed by: Griffin Dakin, CRNA Pre-anesthesia Checklist: Patient identified, Emergency Drugs available, Suction available and Patient being monitored Patient Re-evaluated:Patient Re-evaluated prior to induction Oxygen Delivery Method: Circle system utilized Preoxygenation: Pre-oxygenation with 100% oxygen Induction Type: IV induction Laryngoscope Size: Glidescope and 3 Grade View: Grade I Tube type: Oral Tube size: 8.5 mm Number of attempts: 1 Airway Equipment and Method: Rigid stylet and Video-laryngoscopy Placement Confirmation: ETT inserted through vocal cords under direct vision, positive ETCO2 and breath sounds checked- equal and bilateral Secured at: 23 cm Tube secured with: Tape Dental Injury: Teeth and Oropharynx as per pre-operative assessment

## 2021-06-14 NOTE — Interval H&P Note (Signed)
History and Physical Interval Note:  06/14/2021 10:32 AM  [name]  has presented today for surgery, with the diagnosis of lung nodule.  The various methods of treatment have been discussed with the patient and family. After consideration of risks, benefits and other options for treatment, the patient has consented to  Procedure(s) with comments: ROBOTIC ASSISTED NAVIGATIONAL BRONCHOSCOPY (Bilateral) - ION w/ possible fiducial placement as a surgical intervention.  The patient's history has been reviewed, patient examined, no change in status, stable for surgery.  I have reviewed the patient's chart and labs.  Questions were answered to the patient's satisfaction.     [place]

## 2021-06-14 NOTE — Anesthesia Preprocedure Evaluation (Signed)
Anesthesia Evaluation  Patient identified by MRN, date of birth, ID band Patient awake    Reviewed: Allergy & Precautions, NPO status , Patient's Chart, lab work & pertinent test results  History of Anesthesia Complications Negative for: history of anesthetic complications  Airway Mallampati: III  TM Distance: >3 FB Neck ROM: Full    Dental  (+) Edentulous Lower, Edentulous Upper, Dental Advisory Given   Pulmonary shortness of breath, sleep apnea , Current Smoker and Patient abstained from smoking.,  lung nodule   breath sounds clear to auscultation       Cardiovascular hypertension, Pt. on medications and Pt. on home beta blockers + Past MI  + dysrhythmias  Rhythm:Regular  1. The left ventricle has low normal systolic function, with an ejection  fraction of 50-55%. The cavity size was normal. Left ventricular diastolic  Doppler parameters are consistent with pseudonormalization. Indeterminate  filling pressures No evidence  of left ventricular regional wall motion abnormalities.  2. The mitral valve is grossly normal.  3. The tricuspid valve is grossly normal.  4. The aortic valve was not well visualized.  5. The aorta is normal unless otherwise noted.  6. The inferior vena cava was dilated in size with >50% respiratory  variability.    Neuro/Psych  Headaches, PSYCHIATRIC DISORDERS Anxiety Depression CVA, No Residual Symptoms    GI/Hepatic   Endo/Other  diabetes  Renal/GU      Musculoskeletal  (+) Arthritis ,   Abdominal   Peds  Hematology negative hematology ROS (+) Lab Results      Component                Value               Date                      WBC                      4.7                 06/14/2021                HGB                      13.1                06/14/2021                HCT                      40.7                06/14/2021                MCV                      92.7                 06/14/2021                PLT                      187                 06/14/2021              Anesthesia Other Findings   Reproductive/Obstetrics  Anesthesia Physical Anesthesia Plan  ASA: 3  Anesthesia Plan: General   Post-op Pain Management: Minimal or no pain anticipated   Induction: Intravenous  PONV Risk Score and Plan: 2 and Ondansetron and Dexamethasone  Airway Management Planned: Oral ETT  Additional Equipment: None  Intra-op Plan:   Post-operative Plan: Extubation in OR  Informed Consent: I have reviewed the patients History and Physical, chart, labs and discussed the procedure including the risks, benefits and alternatives for the proposed anesthesia with the patient or authorized representative who has indicated his/her understanding and acceptance.     Dental advisory given  Plan Discussed with: CRNA and Anesthesiologist  Anesthesia Plan Comments:         Anesthesia Quick Evaluation

## 2021-06-14 NOTE — Progress Notes (Signed)
Patient and Edgar Frisk for [facility] of [employer], state the patient does not have anyone to stay with her after the procedure. Lindsi Forte notified Dr. Juline Patch nurse and I notified Dr. Ermalene Postin.

## 2021-06-14 NOTE — H&P (Signed)
Synopsis: Referred in October 2022 for lung nodule by Lajuana Matte, MD   Subjective:    PATIENT ID: [name] GENDER: female DOB: 03/18/1961, MRN: [id number]       Chief Complaint  Patient presents with   Consult      Patient is here because she had a PET scan on 04/01/21. Patient has shortness of breath with exertion.      This is a 60 year old female, past medical history of diabetes, GERD, hypertension, hyperglycemia, MI, sleep apnea on CPAP.  Patient has been followed for a long time by Dr. Pia Mau thoracic surgery for evaluation of a right middle lobe lung nodule.  Lung nodule is macrolobulated and adjacent to the right heart border.  She had a recent nuclear medicine PET scan that showed PET avid uptake within the right heart border.  Unclear whether or not this is correlates with the nodule lesion on CT.  There could be some CT to PET scan body divergence on the overlay images.  There is also question whether or not this could represent a AVM.   06/14/2021: plans for outpatient bronchoscopy today         Past Medical History:  Diagnosis Date   Anxiety     Aortic atherosclerosis ([place]) 09/19/2020    Seen on CAT scan 2021   Arthritis     Depression     Diabetes mellitus     Diabetes mellitus, type II (HCC)     Dyspnea     Dysrhythmia     GERD (gastroesophageal reflux disease)     Heart murmur     HTN (hypertension)     Hyperglycemia     IBS (irritable bowel syndrome)     Lung nodules      right, followed by PCP, PET 11/2011   Mental retardation     MI (myocardial infarction) ([place])     Migraines     Pneumonia     Sleep apnea      cpap    Stroke ([place])             Family History  Problem Relation Age of Onset   Stroke Mother     Heart attack Father     Schizophrenia Other     Drug abuse Other     Alcohol abuse Other     Colon cancer Other          aunt   Obesity Other     COPD Other     Anxiety disorder Other     GER disease Other     Diabetes  type II Other     Anxiety disorder Other     Depression Other     Depression Sister     Schizophrenia Sister     Liver disease Neg Hx     Inflammatory bowel disease Neg Hx             Past Surgical History:  Procedure Laterality Date   ABDOMINAL HYSTERECTOMY       BIOPSY   01/15/2020    Procedure: BIOPSY;  Surgeon: Daneil Dolin, MD;  [facility]: [facility];  Service: Endoscopy;;  gastric   COLONOSCOPY   11/2007    hyperplastic polyps, prior hx of adenomas    COLONOSCOPY   05/2010    incomplete due to poor prep, hyperplastic rectal polyp   COLONOSCOPY   05/05/2002    Dimunitive polyps in the rectum and left colon, cold  biopsied/removed.  Scattered few left-sided diverticula.  Regular colonic   mucosa appeared normal   COLONOSCOPY N/A 05/28/2013    Rourk: mulitple tubular adenomas removed. next tcs 05/2016   COLONOSCOPY WITH PROPOFOL N/A 09/07/2016    Dr. Gala Romney: For hyperplastic polyps removed. Next colonoscopy March 2023 given history of adenomatous colon polyps in the past.   ESOPHAGOGASTRODUODENOSCOPY   08/2007    moderate sized hiatal hernia   ESOPHAGOGASTRODUODENOSCOPY   05/2010    noncritical schatzki ring s/p 37F   ESOPHAGOGASTRODUODENOSCOPY (EGD) WITH ESOPHAGEAL DILATION N/A 02/06/2013    [facility]:EPPIRJ esophagus-s/p dilation up to a 37 [place] size with [facility] dilators.  Hiatal hernia   ESOPHAGOGASTRODUODENOSCOPY (EGD) WITH PROPOFOL N/A 09/07/2016    Dr. Gala Romney: Normal, status post empiric dilation of the esophagus for history of dysphagia   ESOPHAGOGASTRODUODENOSCOPY (EGD) WITH PROPOFOL N/A 05/02/2019    Normal esophagus s/p dilation, normal stomach, normal duodenum   ESOPHAGOGASTRODUODENOSCOPY (EGD) WITH PROPOFOL N/A 01/15/2020    normal esophagus s/p dilation, gastric nodule s/p biopsy. This showed reactive gastropathy with H.pylori.    EXTERNAL EAR SURGERY        bilateral   FOOT SURGERY       GLAUCOMA SURGERY       KNEE ARTHROPLASTY Right 11/04/2020    Procedure: COMPUTER  ASSISTED TOTAL KNEE ARTHROPLASTY;  Surgeon: Rod Can, MD;  Location: [facility];  Service: Orthopedics;  Laterality: Right;   MALONEY DILATION N/A 09/07/2016    Procedure: Venia Minks DILATION;  Surgeon: Daneil Dolin, MD;  [facility];  Service: Endoscopy;  Laterality: N/A;   MALONEY DILATION N/A 05/02/2019    Procedure: Venia Minks DILATION;  Surgeon: Daneil Dolin, MD;  [facility];  Service: Endoscopy;  Laterality: N/A;   MALONEY DILATION N/A 01/15/2020    Procedure: Venia Minks DILATION;  Surgeon: Daneil Dolin, MD;  [facility];  Service: Endoscopy;  Laterality: N/A;   POLYPECTOMY   09/07/2016    Procedure: POLYPECTOMY;  Surgeon: Daneil Dolin, MD;  [facility];  Service: Endoscopy;;  sigmoid colon x4   small bowel capsule   10/2007    normal      Social History         Socioeconomic History   Marital status: Divorced      Spouse name: Not on file   Number of children: Not on file   Years of education: Not on file   Highest education level: High school graduate  Occupational History   Occupation: disabled      Employer: UNEMPLOYED  Tobacco Use   Smoking status: Every Day      Packs/day: 0.50      Years: 40.00      Pack years: 20.00      Types: Cigarettes   Smokeless tobacco: Never   Tobacco comments:      Patient smokes quarter pack a day  Vaping Use   Vaping Use: Never used  Substance and Sexual Activity   Alcohol use: No   Drug use: No   Sexual activity: Not Currently      Partners: Male      Birth control/protection: Surgical      Comment: hyst  Other Topics Concern   Not on file  Social History Narrative   Not on file    Social Determinants of Health       Financial Resource Strain: Low Risk    Difficulty of Paying Living Expenses: Not hard at all  Food Insecurity: No Food Insecurity   Worried About Charity fundraiser in the Last Year: Never true   Ran Out of Food in the Last Year: Never true  Transportation  Needs: No Transportation Needs   Lack of Transportation (Medical): No   Lack of Transportation (Non-Medical): No  Physical Activity: Insufficiently Active   Days of Exercise per Week: 3 days   Minutes of Exercise per Session: 30 min  Stress: Stress Concern Present   Feeling of Stress : To some extent  Social Connections: Moderately Isolated   Frequency of Communication with Friends and Family: More than three times a week   Frequency of Social Gatherings with Friends and Family: More than three times a week   Attends Religious Services: 1 to 4 times per year   Active Member of [employer] or Organizations: No   Attends Archivist Meetings: Never   Marital Status: Separated  Intimate Partner Violence: Not At Risk   Fear of Current or Ex-Partner: No   Emotionally Abused: No   Physically Abused: No   Sexually Abused: No           Allergies  Allergen Reactions   Thorazine [Chlorpromazine Hcl] Anaphylaxis   Acetaminophen Other (See Comments)      Makes pt dizzy   Aspirin Other (See Comments)      seizure   Aspirin-Acetaminophen-Caffeine Other (See Comments)      seizure   Nsaids Nausea And Vomiting   Other        Acidic foods   Penicillins Nausea And Vomiting      Has patient had a PCN reaction causing immediate rash, facial/tongue/throat swelling, SOB or lightheadedness with hypotension:Yes Has patient had a PCN reaction causing severe rash involving mucus membranes or skin necrosis:Yes Has patient had a PCN reaction that required hospitalization:Yes Has patient had a PCN reaction occurring within the last 10 years:No If all of the above answers are "NO", then may proceed with Cephalosporin use.     Tomato Rash            Outpatient Medications Prior to Visit  Medication Sig Dispense Refill   atorvastatin (LIPITOR) 10 MG tablet TAKE 1 TABLET BY MOUTH ONCE A DAY. 30 tablet 5   benztropine (COGENTIN) 1 MG tablet Take 1 tablet (1 mg total) by mouth daily. 90 tablet 2    blood glucose meter kit and supplies Dispense based on patient and insurance preference. Use to test glucose once daily. (FOR ICD10, E11.9). 1 each 0   cycloSPORINE (RESTASIS) 0.05 % ophthalmic emulsion Place 1 drop into both eyes 2 (two) times daily.       dicyclomine (BENTYL) 10 MG capsule Take 1 capsule (10 mg total) by mouth 3 (three) times daily before meals. As needed for loose stool and belly cramping. 90 capsule 5   docusate sodium (COLACE) 100 MG capsule TAKE 1 CAPSULE BY MOUTH TWICE DAILY 60 capsule 5   lamoTRIgine (LAMICTAL) 100 MG tablet Take 1 tablet (100 mg total) by mouth 2 (two) times daily. 180 tablet 2   loperamide (IMODIUM) 2 MG capsule Take by mouth 2 (two) times daily.       mupirocin ointment (BACTROBAN) 2 % SMARTSIG:1 Application Topical 2-3 Times Daily       ondansetron (ZOFRAN) 4 MG tablet Take 1 tablet (4 mg total) by mouth every 6 (six) hours as needed for nausea. 20 tablet 0   oxyCODONE (OXY IR/ROXICODONE) 5 MG immediate release tablet Take by  mouth.       paliperidone (INVEGA) 9 MG 24 hr tablet Take 1 tablet (9 mg total) by mouth daily. 90 tablet 2   polyethylene glycol (MIRALAX / GLYCOLAX) 17 g packet Take 17 g by mouth daily as needed for moderate constipation.       potassium chloride (KLOR-CON) 10 MEQ tablet TAKE 2 TABLETS ( ) BY MOUTH TWICE DAILY WITH FOOD 120 tablet 0   propranolol (INDERAL) 10 MG tablet Take 1 tablet (10 mg total) by mouth 3 (three) times daily. 90 tablet 0   RABEprazole (ACIPHEX) 20 MG tablet Take 1 tablet (20 mg total) by mouth 2 (two) times daily before a meal. 180 tablet 3   senna (SENNA-TABS) 8.6 MG tablet TAKE 1 TABLET BY MOUTH 2 TIMES A DAY (Patient taking differently: As needed constipation) 60 tablet 5   sertraline (ZOLOFT) 100 MG tablet Take 1 tablet (100 mg total) by mouth daily. 90 tablet 2   torsemide (DEMADEX) 20 MG tablet TAKE (1) TABLET BY MOUTH EACH MORNING. 30 tablet 5   traZODone (DESYREL) 100 MG tablet TAKE 2 TABLETS(200MG )  BY MOUTH AT BEDTIME. 180 tablet 2    No facility-administered medications prior to visit.      Review of Systems  Constitutional:  Negative for chills, fever, malaise/fatigue and weight loss.  HENT:  Negative for hearing loss, sore throat and tinnitus.   Eyes:  Negative for blurred vision and double vision.  Respiratory:  Negative for cough, hemoptysis, sputum production, shortness of breath, wheezing and stridor.   Cardiovascular:  Negative for chest pain, palpitations, orthopnea, leg swelling and PND.  Gastrointestinal:  Negative for abdominal pain, constipation, diarrhea, heartburn, nausea and vomiting.  Genitourinary:  Negative for dysuria, hematuria and urgency.  Musculoskeletal:  Negative for joint pain and myalgias.  Skin:  Negative for itching and rash.  Neurological:  Negative for dizziness, tingling, weakness and headaches.  Endo/Heme/Allergies:  Negative for environmental allergies. Does not bruise/bleed easily.  Psychiatric/Behavioral:  Negative for depression. The patient is not nervous/anxious and does not have insomnia.   All other systems reviewed and are negative.    Objective:   General appearance: 60 y.o., female, NAD, conversant  Eyes: anicteric sclerae, moist conjunctivae; no lid-lag; PERRLA, tracking appropriately HENT: NCAT; oropharynx, MMM, no mucosal ulcerations; normal hard and soft palate Neck: Trachea midline; FROM, supple, lymphadenopathy, no JVD Lungs: CTAB, no crackles, no wheeze, with normal respiratory effort and no intercostal retractions CV: RRR, S1, S2, no MRGs  Abdomen: Soft, non-tender; non-distended, BS present  Extremities: No peripheral edema, radial and DP pulses present bilaterally  Skin: Normal temperature, turgor and texture; no rash Psych: Appropriate affect Neuro: Alert and oriented to person and place, no focal deficit      BP (!) 135/55   Pulse 71   Temp 97.7 F (36.5 C) (Oral)   Resp 17   Ht 5' 10.5" (1.791 m)   Wt 104.3 kg    SpO2 93%   BMI 32.54 kg/m      CBC Labs (Brief)          Component Value Date/Time    WBC 4.1 03/03/2021 1101    WBC 7.9 11/07/2020 0354    RBC 4.52 03/03/2021 1101    RBC 2.92 (L) 11/07/2020 0354    HGB 12.8 03/03/2021 1101    HCT 37.9 03/03/2021 1101    PLT 184 03/03/2021 1101    MCV 84 03/03/2021 1101    MCH 28.3 03/03/2021 1101  MCH 30.5 11/07/2020 0354    MCHC 33.8 03/03/2021 1101    MCHC 33.0 11/07/2020 0354    RDW 14.6 03/03/2021 1101    LYMPHSABS 1.6 03/03/2021 1101    MONOABS 0.8 03/22/2019 0130    EOSABS 0.0 03/03/2021 1101    BASOSABS 0.0 03/03/2021 1101          Chest Imaging: 03/04/2021 CT chest: Patient with right middle lobe pulmonary nodule slowly enlarging over time could represent indolent neoplasm it is macrolobulated associated preclosed to the vessels.  There was a concern whether or not this could be a AVM.  Does not appear that she has had a CTA.  04/01/2021: Nuclear medicine pet imaging with PET avid uptake on the right heart border adjacent to the same area as the nodule.  There could be some CT PET scan divergence on the overlay.  The hypermetabolic area could represent where the nodules located at does potentially appear this way. May be dealing with a indolent neoplasm. The patient's images have been independently reviewed by me.     Pulmonary Functions Testing Results: No flowsheet data found.   FeNO:    Pathology:    Echocardiogram:    Heart Catheterization:     Assessment & Plan:        ICD-10-CM    1. Lung nodule  R91.1 Ambulatory referral to Pulmonology      CT Angio Chest W/Cm &/Or Wo Cm      Procedural/ Surgical Case Request: VIDEO BRONCHOSCOPY WITH ENDOBRONCHIAL NAVIGATION     2. Obstructive sleep apnea syndrome  G47.33       3. Smoker  F17.200       4. Tobacco use  Z72.0           Discussion:   This is a 60 year old female, obese, history of OSA, tobacco abuse.  Has a nodule that has been followed for some time by  thoracic surgery.  Patient recently saw Dr. Kipp Brood had a nuclear medicine PET scan.  We reviewed the results as stated above.  I am unsure if this nodule is hypermetabolic or not.  Not really sure why there is a significant amount of uptake on the right heart border.  We could be dealing with a little bit of CT overlay body divergence and the nodule may be actually hypermetabolic.  Either way the nodule has slowly been enlarging over time.  Indolent neoplasm has yet to be excluded.  Other options could include a pulmonary AVM she does not have a CTA.  Plan: Here today for bronchoscopy  We discussed risks benefits and alternatives Patient has SWS with her today  She lives alone and will need to stay in hospital for OBS after bronchoscopy  Plan for admit obs and discharge tomorrow AM  Garner Nash, DO [facility] 06/14/2021 9:54 AM

## 2021-06-15 ENCOUNTER — Observation Stay (HOSPITAL_COMMUNITY): Payer: Medicare Other

## 2021-06-15 DIAGNOSIS — F17210 Nicotine dependence, cigarettes, uncomplicated: Secondary | ICD-10-CM | POA: Diagnosis not present

## 2021-06-15 DIAGNOSIS — F17200 Nicotine dependence, unspecified, uncomplicated: Secondary | ICD-10-CM | POA: Diagnosis not present

## 2021-06-15 DIAGNOSIS — G4733 Obstructive sleep apnea (adult) (pediatric): Secondary | ICD-10-CM | POA: Diagnosis not present

## 2021-06-15 DIAGNOSIS — M170 Bilateral primary osteoarthritis of knee: Secondary | ICD-10-CM | POA: Diagnosis not present

## 2021-06-15 DIAGNOSIS — Z79899 Other long term (current) drug therapy: Secondary | ICD-10-CM | POA: Diagnosis not present

## 2021-06-15 DIAGNOSIS — R911 Solitary pulmonary nodule: Secondary | ICD-10-CM | POA: Diagnosis not present

## 2021-06-15 DIAGNOSIS — R270 Ataxia, unspecified: Secondary | ICD-10-CM | POA: Diagnosis not present

## 2021-06-15 DIAGNOSIS — J441 Chronic obstructive pulmonary disease with (acute) exacerbation: Secondary | ICD-10-CM | POA: Diagnosis not present

## 2021-06-15 DIAGNOSIS — Z9889 Other specified postprocedural states: Secondary | ICD-10-CM | POA: Diagnosis not present

## 2021-06-15 DIAGNOSIS — I10 Essential (primary) hypertension: Secondary | ICD-10-CM | POA: Diagnosis not present

## 2021-06-15 DIAGNOSIS — R918 Other nonspecific abnormal finding of lung field: Secondary | ICD-10-CM | POA: Diagnosis not present

## 2021-06-15 DIAGNOSIS — E119 Type 2 diabetes mellitus without complications: Secondary | ICD-10-CM | POA: Diagnosis not present

## 2021-06-15 DIAGNOSIS — I700 Atherosclerosis of aorta: Secondary | ICD-10-CM | POA: Diagnosis not present

## 2021-06-15 LAB — CBC
HCT: 37.1 % (ref 36.0–46.0)
Hemoglobin: 12.1 g/dL (ref 12.0–15.0)
MCH: 30.5 pg (ref 26.0–34.0)
MCHC: 32.6 g/dL (ref 30.0–36.0)
MCV: 93.5 fL (ref 80.0–100.0)
Platelets: 152 10*3/uL (ref 150–400)
RBC: 3.97 MIL/uL (ref 3.87–5.11)
RDW: 14.8 % (ref 11.5–15.5)
WBC: 5.5 10*3/uL (ref 4.0–10.5)
nRBC: 0.4 % — ABNORMAL HIGH (ref 0.0–0.2)

## 2021-06-15 NOTE — Anesthesia Postprocedure Evaluation (Signed)
Anesthesia Post Note  Patient: [name]  Procedure(s) Performed: ROBOTIC ASSISTED NAVIGATIONAL BRONCHOSCOPY (Bilateral) BRONCHIAL BIOPSIES BRONCHIAL BRUSHINGS BRONCHIAL NEEDLE ASPIRATION BIOPSIES VIDEO BRONCHOSCOPY WITH RADIAL ENDOBRONCHIAL ULTRASOUND FIDUCIAL MARKER PLACEMENT     Patient location during evaluation: PACU Anesthesia Type: General Level of consciousness: awake and alert Pain management: pain level controlled Vital Signs Assessment: post-procedure vital signs reviewed and stable Respiratory status: spontaneous breathing, nonlabored ventilation, respiratory function stable and patient connected to nasal cannula oxygen Cardiovascular status: blood pressure returned to baseline and stable Postop Assessment: no apparent nausea or vomiting Anesthetic complications: no   No notable events documented.  Last Vitals:  Vitals:   06/15/21 0443 06/15/21 0737  BP: 114/62 94/75  Pulse: 63 64  Resp: 17 16  Temp: 36.6 C 36.8 C  SpO2: 93% 99%    Last Pain:  Vitals:   06/15/21 1000  TempSrc:   PainSc: 0-No pain                 Finnian Husted

## 2021-06-15 NOTE — Progress Notes (Signed)
Patient arrived to 6N06 from Endoscopy. Report received from [place], [place]. Patient alert and oriented x4. Skin intact. See assessment. Pt c/o 0/10 pain and in no distress. Patient call bell within reach. Will continue to monitor.

## 2021-06-15 NOTE — Progress Notes (Signed)
Discharge instructions provided to patient and care taker. Assisted patient with using restroom and getting dressed. Iv removed, patient discharged

## 2021-06-15 NOTE — Discharge Summary (Addendum)
Physician Discharge Summary  Patient ID: [name] MRN: [id number] DOB/AGE: 1960/12/23 60 y.o.  Admit date: 06/14/2021 Discharge date: 06/15/2021  Problem List Principal Problem:   Lung nodule Active Problems:   S/P bronchoscopy with biopsy  HPI: 60 year old female with history significant for current tobacco abuse, OSA on CPAP, GERD, HTN, and DM with complaints of exertional shortness of breath.    She has been followed previously long term by Dr. Servando Snare for a known right middle lobe lung nodule, which is macrolobulated and adjacent to right heart border.  Recent PET scan on 04/01/21 that showed avid uptake within the right heart border.  Unclear if if nodule is actually hypermetabolic or whether it correlates with nodule seen on CT versus overlay body divergence.  There was some question of it being a possible AVM, however chest CTA on 05/26/21 showed no enhancement after IV contrast to suggest pulmonary AVM.  She was taken for a robotic assisted navigational bronchoscopy with biopsy under general anesthesia with Dr. Valeta Harms on 12/6.  She is to be admitted overnight for observation as she lives alone.  Preliminary pathology felt to be malignant.  A single fiducial was placed in proximity of lung lesion post biopsy.   Hospital Course:  Post Bronchoscopy for biopsy of PET avid right middle lobe lung nodule that  is macrolobulated and adjacent to the right heart border. Single fiducial was placed in proximity of lung lesion post biopsy.   Plan  Admit to Observation  x 23 hours, as patient lives alone  Monitor for hemoptysis, bronchospasm, facial crepitus, hypoxemia, , hemorrhage or chest tightness Chest x-ray reviewed 12 /7 without focal defect She is ready for discharge home and to follow-up with Eric Form, 06-22-21 @ 3:00 pm   Mild Laryngeal spasm on extubation post procedure No stridor on exam in PACU Plan Racemic Epi x 1 prn if needed for further laryngeal bronchospasm/  stridor   OSA on CPAP at home Plan No CPAP tonight  If patient develops distress, please call PCCM to evaluate     Tobacco Abuse Plan Ongoing smoking cessation counseling   Home Meds Plan Will continue appropriate home medications overnight.  Continue antipsychotics and anxiolytics.   General: Mental retardation. HEENT: MM pink/moist Neuro: Follows commands moves extremities no focal CV: Heart sounds are regular PULM: Diminished in the bases currently on room air without distress  GI: soft, bsx4 active  GU: Voids Extremities: warm/dry, 1+ edema  Skin: no rashes or lesions   Labs at discharge Lab Results  Component Value Date   CREATININE 0.83 06/09/2021   BUN 7 06/09/2021   NA 140 06/09/2021   K 4.3 06/09/2021   CL 97 06/09/2021   CO2 29 06/09/2021   Lab Results  Component Value Date   WBC 5.5 06/15/2021   HGB 12.1 06/15/2021   HCT 37.1 06/15/2021   MCV 93.5 06/15/2021   PLT 152 06/15/2021   Lab Results  Component Value Date   ALT 10 10/25/2020   AST 19 10/25/2020   ALKPHOS 75 10/25/2020   BILITOT 0.7 10/25/2020   Lab Results  Component Value Date   INR 1.0 10/25/2020    Current radiology studies DG CHEST PORT 1 VIEW  Result Date: 06/15/2021 CLINICAL DATA:  60 year old female with history of COPD exacerbation. EXAM: PORTABLE CHEST 1 VIEW COMPARISON:  Chest x-ray 06/14/2021. FINDINGS: Lung volumes are normal. No consolidative airspace disease. No pleural effusions. No pneumothorax. No pulmonary nodule or mass noted. Pulmonary vasculature  and the cardiomediastinal silhouette are within normal limits. Atherosclerosis in the thoracic aorta. Metallic density projects over the infrahilar region of the right lung, similar to prior examination, presumably a biopsy clip. IMPRESSION: 1.  No radiographic evidence of acute cardiopulmonary disease. 2. Aortic atherosclerosis. Electronically Signed   By: Vinnie Langton M.D.   On: 06/15/2021 05:52   DG CHEST PORT 1  VIEW  Result Date: 06/14/2021 CLINICAL DATA:  Bronchoscopy EXAM: PORTABLE CHEST 1 VIEW COMPARISON:  CT chest, 05/26/2021 FINDINGS: The heart size and mediastinal contours are within normal limits. Nodular opacity of the medial segment right middle lobe with biopsy marking clip. No pneumothorax. The visualized skeletal structures are unremarkable. IMPRESSION: 1. Nodular opacity of the medial segment right middle lobe with biopsy marking clip, better demonstrated by prior CT. 2.  No pneumothorax. Electronically Signed   By: Delanna Ahmadi M.D.   On: 06/14/2021 14:12   DG C-ARM BRONCHOSCOPY  Result Date: 06/14/2021 C-ARM BRONCHOSCOPY: Fluoroscopy was utilized by the requesting physician.  No radiographic interpretation.    Disposition:  Discharge disposition: 01-Home or [place] Kent Riendeau ACNP Acute Care Nurse Practitioner [place] Please consult Amion 06/15/2021, 9:55 AM     Allergies as of 06/15/2021       Reactions   Thorazine [chlorpromazine Hcl] Anaphylaxis   Acetaminophen Other (See Comments)   Makes pt dizzy   Aspirin Other (See Comments)   seizure   Aspirin-acetaminophen-caffeine Other (See Comments)   seizure   Nsaids Nausea And Vomiting   Other    Acidic foods   Penicillins Nausea And Vomiting   Has patient had a PCN reaction causing immediate rash, facial/tongue/throat swelling, SOB or lightheadedness with hypotension:Yes Has patient had a PCN reaction causing severe rash involving mucus membranes or skin necrosis:Yes Has patient had a PCN reaction that required hospitalization:Yes Has patient had a PCN reaction occurring within the last 10 years:No If all of the above answers are "NO", then may proceed with Cephalosporin use.   Tomato Rash        Medication List     TAKE these medications    atorvastatin 10 MG tablet Commonly known as: LIPITOR TAKE 1 TABLET BY MOUTH ONCE A DAY.   benztropine 1 MG tablet Commonly known as:  COGENTIN Take 1 tablet (1 mg total) by mouth daily.   blood glucose meter kit and supplies Dispense based on patient and insurance preference. Use to test glucose once daily. (FOR ICD10, E11.9).   cycloSPORINE 0.05 % ophthalmic emulsion Commonly known as: RESTASIS Place 1 drop into both eyes 2 (two) times daily.   dicyclomine 10 MG capsule Commonly known as: BENTYL TAKE 1 CAPSULE BY MOUTH UP TO 3 TIMES DAILY BEFORE MEALS AS NEEDED FOR ABDOMINAL CRAMPS/LOOSE STOOLS.   docusate sodium 100 MG capsule Commonly known as: COLACE TAKE 1 CAPSULE BY MOUTH TWICE DAILY   lamoTRIgine 100 MG tablet Commonly known as: LAMICTAL Take 1 tablet (100 mg total) by mouth 2 (two) times daily.   loperamide 2 MG capsule Commonly known as: IMODIUM Take 2 mg by mouth 4 (four) times daily as needed for diarrhea or loose stools.   ondansetron 4 MG tablet Commonly known as: ZOFRAN Take 1 tablet (4 mg total) by mouth every 6 (six) hours as needed for nausea.   paliperidone 9 MG 24 hr tablet Commonly known as: INVEGA Take 1 tablet (9 mg total) by mouth daily.   potassium chloride 10 MEQ tablet Commonly  known as: KLOR-CON TAKE 2 TABLETS (20MEQ) BY MOUTH TWICE DAILY WITH FOOD   propranolol 10 MG tablet Commonly known as: INDERAL Take 1 tablet (10 mg total) by mouth 3 (three) times daily.   RABEprazole 20 MG tablet Commonly known as: Aciphex Take 1 tablet (20 mg total) by mouth 2 (two) times daily before a meal.   Senna-Tabs 8.6 MG tablet Generic drug: senna TAKE 1 TABLET BY MOUTH 2 TIMES A DAY   sertraline 100 MG tablet Commonly known as: ZOLOFT TAKE 1 TABLET BY MOUTH ONCE A DAY.   torsemide 20 MG tablet Commonly known as: DEMADEX TAKE (1) TABLET BY MOUTH EACH MORNING.   traZODone 100 MG tablet Commonly known as: DESYREL TAKE 2 TABLETS($RemoveBefore'200MG'[id number]$ ) BY MOUTH AT BEDTIME.        Follow-up Information     Icard, Bradley L, DO. Schedule an appointment as soon as possible for a visit in 1  week(s).   Specialty: Pulmonary Disease Why: Please make appt with Dr. Valeta Harms or Eric Form, NP sometime next week Contact information: [address] [place] [address] [phone]                  Discharged Condition: fair  Time spent on discharge  40 minutes.  Vital signs at Discharge. Temp:  [97.4 F (36.3 C)-98.4 F (36.9 C)] 98.3 F (36.8 C) (12/07 0737) Pulse Rate:  [57-87] 64 (12/07 0737) Resp:  [11-25] 16 (12/07 0737) BP: (94-142)/(62-108) 94/75 (12/07 0737) SpO2:  [92 %-100 %] 99 % (12/07 0737) Office follow up Special Information or instructions. Check sO2 sats with ambulation Signed: Richardson Landry Osiel Stick ACNP Acute Care Nurse Practitioner [place] Please consult Amion 06/15/2021, 9:50 AM

## 2021-06-16 ENCOUNTER — Encounter (HOSPITAL_COMMUNITY): Payer: Self-pay | Admitting: Pulmonary Disease

## 2021-06-16 DIAGNOSIS — R270 Ataxia, unspecified: Secondary | ICD-10-CM | POA: Diagnosis not present

## 2021-06-16 DIAGNOSIS — F17200 Nicotine dependence, unspecified, uncomplicated: Secondary | ICD-10-CM | POA: Diagnosis not present

## 2021-06-16 DIAGNOSIS — M170 Bilateral primary osteoarthritis of knee: Secondary | ICD-10-CM | POA: Diagnosis not present

## 2021-06-17 DIAGNOSIS — F17200 Nicotine dependence, unspecified, uncomplicated: Secondary | ICD-10-CM | POA: Diagnosis not present

## 2021-06-17 DIAGNOSIS — M170 Bilateral primary osteoarthritis of knee: Secondary | ICD-10-CM | POA: Diagnosis not present

## 2021-06-17 DIAGNOSIS — R270 Ataxia, unspecified: Secondary | ICD-10-CM | POA: Diagnosis not present

## 2021-06-17 LAB — CYTOLOGY - NON PAP

## 2021-06-19 ENCOUNTER — Encounter: Payer: Self-pay | Admitting: Podiatry

## 2021-06-20 DIAGNOSIS — F17200 Nicotine dependence, unspecified, uncomplicated: Secondary | ICD-10-CM | POA: Diagnosis not present

## 2021-06-20 DIAGNOSIS — R270 Ataxia, unspecified: Secondary | ICD-10-CM | POA: Diagnosis not present

## 2021-06-20 DIAGNOSIS — M170 Bilateral primary osteoarthritis of knee: Secondary | ICD-10-CM | POA: Diagnosis not present

## 2021-06-21 DIAGNOSIS — R270 Ataxia, unspecified: Secondary | ICD-10-CM | POA: Diagnosis not present

## 2021-06-21 DIAGNOSIS — M170 Bilateral primary osteoarthritis of knee: Secondary | ICD-10-CM | POA: Diagnosis not present

## 2021-06-21 DIAGNOSIS — F17200 Nicotine dependence, unspecified, uncomplicated: Secondary | ICD-10-CM | POA: Diagnosis not present

## 2021-06-22 ENCOUNTER — Other Ambulatory Visit: Payer: Self-pay | Admitting: Acute Care

## 2021-06-22 ENCOUNTER — Inpatient Hospital Stay: Payer: Medicare Other | Admitting: Acute Care

## 2021-06-22 DIAGNOSIS — F17200 Nicotine dependence, unspecified, uncomplicated: Secondary | ICD-10-CM | POA: Diagnosis not present

## 2021-06-22 DIAGNOSIS — R911 Solitary pulmonary nodule: Secondary | ICD-10-CM

## 2021-06-22 DIAGNOSIS — M170 Bilateral primary osteoarthritis of knee: Secondary | ICD-10-CM | POA: Diagnosis not present

## 2021-06-22 DIAGNOSIS — R270 Ataxia, unspecified: Secondary | ICD-10-CM | POA: Diagnosis not present

## 2021-06-23 ENCOUNTER — Other Ambulatory Visit: Payer: Self-pay | Admitting: Family Medicine

## 2021-06-23 DIAGNOSIS — M170 Bilateral primary osteoarthritis of knee: Secondary | ICD-10-CM | POA: Diagnosis not present

## 2021-06-23 DIAGNOSIS — R270 Ataxia, unspecified: Secondary | ICD-10-CM | POA: Diagnosis not present

## 2021-06-23 DIAGNOSIS — F17200 Nicotine dependence, unspecified, uncomplicated: Secondary | ICD-10-CM | POA: Diagnosis not present

## 2021-06-24 ENCOUNTER — Inpatient Hospital Stay: Payer: Medicare Other | Admitting: Primary Care

## 2021-06-24 ENCOUNTER — Ambulatory Visit (INDEPENDENT_AMBULATORY_CARE_PROVIDER_SITE_OTHER): Payer: Medicare Other | Admitting: Primary Care

## 2021-06-24 ENCOUNTER — Encounter: Payer: Self-pay | Admitting: Primary Care

## 2021-06-24 ENCOUNTER — Other Ambulatory Visit: Payer: Self-pay

## 2021-06-24 VITALS — BP 130/86 | HR 71 | Ht 70.5 in | Wt 229.4 lb

## 2021-06-24 DIAGNOSIS — F17200 Nicotine dependence, unspecified, uncomplicated: Secondary | ICD-10-CM | POA: Diagnosis not present

## 2021-06-24 DIAGNOSIS — G4733 Obstructive sleep apnea (adult) (pediatric): Secondary | ICD-10-CM

## 2021-06-24 DIAGNOSIS — R911 Solitary pulmonary nodule: Secondary | ICD-10-CM

## 2021-06-24 DIAGNOSIS — D3A8 Other benign neuroendocrine tumors: Secondary | ICD-10-CM

## 2021-06-24 NOTE — Progress Notes (Signed)
_0  ID: [name], female    DOB: [birth year], 60 y.o.   MRN: [id number]  No chief complaint on file.   Referring provider: Kathyrn Drown, MD  HPI: 60 year old female, current every day smoker.  Past medical history significant for OSA, GERD, hypertension, diabetes.  Patient of Dr. Valeta Harms.  06/24/2021- Interim hx  Patient presents today for post bronchoscopy follow-up.  She was previously followed long-term by Dr. Pia Mau for known right middle lobe lung nodule.  Recent PET scan on 04/01/2021 showed avid uptake within the right heart further.  Patient underwent robotic assisted navigational bronchoscopy with Dr. Valeta Harms on 12/6, preliminary pathology felt to be malignant. Single fiducial was placed in proximity of lung lesion post biopsy. Pathology showed neoplastic cells consistent with neuroendocrine tumor.   She is doing well today. No change in breathing. She gets short winded during the daytime if she is walking. She has a productive cough with thick yellow mucus. She is taking mucinex daily. She denies chest pain or hemoptysis. We reviewed pathology results and plan. Dr. Kipp Brood did not feel patient would be good candidate for surgery, recommended referral to rad/onc. Patient contact is [employer].   Allergies  Allergen Reactions   Thorazine [Chlorpromazine Hcl] Anaphylaxis   Acetaminophen Other (See Comments)    Makes pt dizzy   Aspirin Other (See Comments)    seizure   Aspirin-Acetaminophen-Caffeine Other (See Comments)    seizure   Nsaids Nausea And Vomiting   Other     Acidic foods   Penicillins Nausea And Vomiting    Has patient had a PCN reaction causing immediate rash, facial/tongue/throat swelling, SOB or lightheadedness with hypotension:Yes Has patient had a PCN reaction causing severe rash involving mucus membranes or skin necrosis:Yes Has patient had a PCN reaction that required hospitalization:Yes Has patient had a PCN reaction occurring within the  last 10 years:No If all of the above answers are "NO", then may proceed with Cephalosporin use.    Tomato Rash    Immunization History  Administered Date(s) Administered   Influenza Split 04/28/2013   Influenza,inj,Quad PF,6+ Mos 06/09/2015, 04/23/2017, 03/23/2019, 05/31/2020, 06/09/2021   Influenza-Unspecified 05/12/2014, 04/28/2016, 05/01/2018   Moderna Sars-Covid-2 Vaccination 09/08/2019, 10/08/2019   Pneumococcal Polysaccharide-23 03/03/2014   Zoster Recombinat (Shingrix) 05/18/2021    Past Medical History:  Diagnosis Date   Anxiety    Aortic atherosclerosis ([place]) 09/19/2020   Seen on CAT scan 2021   Arthritis    Depression    Diabetes mellitus    Diabetes mellitus, type II (HCC)    Dyspnea    Dysrhythmia    GERD (gastroesophageal reflux disease)    Heart murmur    HTN (hypertension)    Hyperglycemia    IBS (irritable bowel syndrome)    Lung nodules    right, followed by PCP, PET 11/2011   Mental retardation    MI (myocardial infarction) ([place])    Migraines    Pneumonia    Sleep apnea    cpap    Stroke ([place])     Tobacco History: Social History   Tobacco Use  Smoking Status Every Day   Packs/day: 0.50   Years: 40.00   Pack years: 20.00   Types: Cigarettes  Smokeless Tobacco Never   Ready to quit: Not Answered Counseling given: Not Answered   Outpatient Medications Prior to Visit  Medication Sig Dispense Refill   atorvastatin (LIPITOR) 10 MG tablet TAKE 1 TABLET BY MOUTH ONCE A DAY. 30 tablet  5   benztropine (COGENTIN) 1 MG tablet Take 1 tablet (1 mg total) by mouth daily. 90 tablet 2   blood glucose meter kit and supplies Dispense based on patient and insurance preference. Use to test glucose once daily. (FOR ICD10, E11.9). 1 each 0   cycloSPORINE (RESTASIS) 0.05 % ophthalmic emulsion Place 1 drop into both eyes 2 (two) times daily.     dicyclomine (BENTYL) 10 MG capsule TAKE 1 CAPSULE BY MOUTH UP TO 3 TIMES DAILY BEFORE MEALS AS NEEDED FOR ABDOMINAL  CRAMPS/LOOSE STOOLS. 90 capsule 3   docusate sodium (COLACE) 100 MG capsule TAKE 1 CAPSULE BY MOUTH TWICE DAILY 60 capsule 5   lamoTRIgine (LAMICTAL) 100 MG tablet Take 1 tablet (100 mg total) by mouth 2 (two) times daily. 180 tablet 2   loperamide (IMODIUM) 2 MG capsule Take 2 mg by mouth 4 (four) times daily as needed for diarrhea or loose stools.     ondansetron (ZOFRAN) 4 MG tablet Take 1 tablet (4 mg total) by mouth every 6 (six) hours as needed for nausea. 20 tablet 0   paliperidone (INVEGA) 9 MG 24 hr tablet Take 1 tablet (9 mg total) by mouth daily. 90 tablet 2   potassium chloride (KLOR-CON) 10 MEQ tablet TAKE 2 TABLETS (20MEQ) BY MOUTH TWICE DAILY WITH FOOD 120 tablet 0   propranolol (INDERAL) 10 MG tablet TAKE 1 TABLET BY MOUTH THREE TIMES A DAY. 90 tablet 0   RABEprazole (ACIPHEX) 20 MG tablet Take 1 tablet (20 mg total) by mouth 2 (two) times daily before a meal. 180 tablet 3   senna (SENNA-TABS) 8.6 MG tablet TAKE 1 TABLET BY MOUTH 2 TIMES A DAY 60 tablet 5   sertraline (ZOLOFT) 100 MG tablet TAKE 1 TABLET BY MOUTH ONCE A DAY. 30 tablet 0   torsemide (DEMADEX) 20 MG tablet TAKE (1) TABLET BY MOUTH EACH MORNING. 30 tablet 5   traZODone (DESYREL) 100 MG tablet TAKE 2 TABLETS(200MG) BY MOUTH AT BEDTIME. 60 tablet 0   No facility-administered medications prior to visit.   Review of Systems  Review of Systems  Constitutional: Negative.   HENT: Negative.    Respiratory:  Positive for cough, shortness of breath and wheezing. Negative for chest tightness.     Physical Exam  BP 130/86 (BP Location: Left Arm, Cuff Size: Normal)    Pulse 71    Ht 5' 10.5" (1.791 m)    Wt 229 lb 6.4 oz (104.1 kg)    SpO2 98%    BMI 32.45 kg/m  Physical Exam Constitutional:      Appearance: Normal appearance.  HENT:     Head: Normocephalic and atraumatic.     Mouth/Throat:     Mouth: Mucous membranes are moist.     Pharynx: Oropharynx is clear.     Comments: Mallampati class II Cardiovascular:      Rate and Rhythm: Normal rate and regular rhythm.     Comments: No edema Pulmonary:     Effort: Pulmonary effort is normal.     Breath sounds: Normal breath sounds. No wheezing, rhonchi or rales.     Comments: CTA Musculoskeletal:        General: Normal range of motion.     Cervical back: Normal range of motion and neck supple.  Skin:    General: Skin is warm and dry.  Neurological:     General: No focal deficit present.     Mental Status: She is alert and oriented to person, place,  and time. Mental status is at baseline.  Psychiatric:        Mood and Affect: Mood normal.        Behavior: Behavior normal.        Thought Content: Thought content normal.        Judgment: Judgment normal.     Lab Results:  CBC    Component Value Date/Time   WBC 5.5 06/15/2021 0226   RBC 3.97 06/15/2021 0226   HGB 12.1 06/15/2021 0226   HGB 12.8 03/03/2021 1101   HCT 37.1 06/15/2021 0226   HCT 37.9 03/03/2021 1101   PLT 152 06/15/2021 0226   PLT 184 03/03/2021 1101   MCV 93.5 06/15/2021 0226   MCV 84 03/03/2021 1101   MCH 30.5 06/15/2021 0226   MCHC 32.6 06/15/2021 0226   RDW 14.8 06/15/2021 0226   RDW 14.6 03/03/2021 1101   LYMPHSABS 1.6 03/03/2021 1101   MONOABS 0.8 03/22/2019 0130   EOSABS 0.0 03/03/2021 1101   BASOSABS 0.0 03/03/2021 1101    BMET    Component Value Date/Time   NA 140 06/09/2021 1111   K 4.3 06/09/2021 1111   CL 97 06/09/2021 1111   CO2 29 06/09/2021 1111   GLUCOSE 81 06/09/2021 1111   GLUCOSE 108 (H) 11/05/2020 0340   BUN 7 06/09/2021 1111   CREATININE 0.83 06/09/2021 1111   CREATININE 0.84 05/01/2013 1044   CALCIUM 9.5 06/09/2021 1111   GFRNONAA >60 11/05/2020 0340   GFRAA 92 07/16/2020 1111    BNP No results found for: BNP  ProBNP    Component Value Date/Time   PROBNP <30.0 05/22/2008 2220    Imaging: CT Angio Chest W/Cm &/Or Wo Cm  Result Date: 05/26/2021 CLINICAL DATA:  Pulmonary nodule. Question raised of pulmonary AVM on recent  PET-CT. EXAM: CT ANGIOGRAPHY CHEST WITH CONTRAST TECHNIQUE: Multidetector CT imaging of the chest was performed using the standard protocol during bolus administration of intravenous contrast. Multiplanar CT image reconstructions and MIPs were obtained to evaluate the vascular anatomy. CONTRAST:  31m ISOVUE-370 IOPAMIDOL (ISOVUE-370) INJECTION 76% COMPARISON:  PET-CT 04/01/2021. FINDINGS: Cardiovascular: The heart size is normal. No substantial pericardial effusion. Mild atherosclerotic calcification is noted in the wall of the thoracic aorta. Mediastinum/Nodes: No mediastinal lymphadenopathy. There is no hilar lymphadenopathy. The esophagus has normal imaging features. There is no axillary lymphadenopathy. Lungs/Pleura: Right middle lobe pulmonary nodule of concern on previous CT and previous PET-CT shows no enhancement after IV contrast administration to suggest pulmonary AVM. Nodule does demonstrate a branching configuration (see sagittal image 79 of series 7 and images 51-63 of coronal series 6). No other suspicious pulmonary nodule or mass. No focal airspace consolidation. No pleural effusion. Upper Abdomen: Unremarkable. Musculoskeletal: No worrisome lytic or sclerotic osseous abnormality. Review of the MIP images confirms the above findings. IMPRESSION: 1. Right middle lobe pulmonary nodule of concern on previous CT and PET-CT shows no enhancement after IV contrast administration to suggest pulmonary AVM. Nodule does demonstrate a branching configuration suggesting endobronchial etiology. 2. No other suspicious pulmonary nodule or mass. 3. Aortic Atherosclerosis (ICD10-I70.0). Electronically Signed   By: EMisty StanleyM.D.   On: 05/26/2021 11:36   DG CHEST PORT 1 VIEW  Result Date: 06/15/2021 CLINICAL DATA:  60year old female with history of COPD exacerbation. EXAM: PORTABLE CHEST 1 VIEW COMPARISON:  Chest x-ray 06/14/2021. FINDINGS: Lung volumes are normal. No consolidative airspace disease. No pleural  effusions. No pneumothorax. No pulmonary nodule or mass noted. Pulmonary vasculature and the cardiomediastinal silhouette  are within normal limits. Atherosclerosis in the thoracic aorta. Metallic density projects over the infrahilar region of the right lung, similar to prior examination, presumably a biopsy clip. IMPRESSION: 1.  No radiographic evidence of acute cardiopulmonary disease. 2. Aortic atherosclerosis. Electronically Signed   By: Vinnie Langton M.D.   On: 06/15/2021 05:52   DG CHEST PORT 1 VIEW  Result Date: 06/14/2021 CLINICAL DATA:  Bronchoscopy EXAM: PORTABLE CHEST 1 VIEW COMPARISON:  CT chest, 05/26/2021 FINDINGS: The heart size and mediastinal contours are within normal limits. Nodular opacity of the medial segment right middle lobe with biopsy marking clip. No pneumothorax. The visualized skeletal structures are unremarkable. IMPRESSION: 1. Nodular opacity of the medial segment right middle lobe with biopsy marking clip, better demonstrated by prior CT. 2.  No pneumothorax. Electronically Signed   By: Delanna Ahmadi M.D.   On: 06/14/2021 14:12   DG C-ARM BRONCHOSCOPY  Result Date: 06/14/2021 C-ARM BRONCHOSCOPY: Fluoroscopy was utilized by the requesting physician.  No radiographic interpretation.     Assessment & Plan:   Pulmonary nodule, right - Previously followed long-term by Dr. Pia Mau for known right middle lobe lung nodule.  Recent PET scan on 04/01/2021 showed avid uptake within the right heart further. Patient underwent bronchoscopy with Dr. Valeta Harms on 06/14/21, cytology showed neoplastic cells consistent with neuroendocrine tumor. We reviewed results with patient today and answered all questions. Dr. Kipp Brood did not feel patient would be good candidate for surgery, recommended referral to rad/onc. Patient has been set up for PFTs on January 20th, advised she keeps those to evaluate for underlying COPD/treament.    Martyn Ehrich, NP 06/24/2021

## 2021-06-24 NOTE — Patient Instructions (Addendum)
Path showed neoplastic cells consistent with neuroendocrine tumor which is a rare cancer type that is usually slow growing. These tumors can grow in any area of the body that receive signals from nervous system. Surgery is the most common treatment   Referral: - Cardiothoracic surgery Dr. Kipp Brood re: neuroendocrine tumor (high priority)  Orders:  - Needs PFTs- high priority/first available  - Please provider patient with incentive spirometry   Follow-up: - 2-3 months with Dr. Valeta Harms or sooner if needed    Carcinoid Tumors A carcinoid tumor is a rare type of cancer that usually grows very slowly. These tumors can grow in any area of the body where there are cells that receive signals from the nervous system (neuroendocrine cells). Carcinoid tumors are also called neuroendocrine tumors. Some of these tumors may secrete chemical messengers (hormones). The small intestine is the most common place for carcinoid tumors to grow. They may also grow in other areas of the digestive system or in the lungs. There are three types of carcinoid tumors: Slow-growing tumors are the most common type. This type of tumor may not cause symptoms for many years. Faster-growing carcinoid tumors may spread to other areas of the body, including the liver. This type causes symptoms sooner. Hormone-secreting tumors cause symptoms because of the hormones they secrete. These symptoms are called carcinoid syndrome. What are the causes? Cancer results when healthy cells change and start to grow out of control. These cells form tumors. Exactly why this happens is not known. What increases the risk? You are more likely to develop this condition if: You are 28-71 years old. You have a family history of a condition called multiple endocrine neoplasia type 1 (MEN 1). You have a stomach condition that reduces the production of stomach acid, such as pernicious anemia, atrophic gastritis, or Zollinger-Ellison syndrome. What are  the signs or symptoms? Slow-growing carcinoid tumors do not cause symptoms for many years. When symptoms do develop, they depend on the location of the tumor and whether the tumor secretes hormones that cause carcinoid syndrome. In some cases, the symptoms of carcinoid syndrome can be triggered by stress, exercise, alcohol (especially red wine), and some foods (especially cheese and chocolate). Symptoms of carcinoid syndrome may include: Flushing of the face and neck. Recurrent diarrhea. Shortness of breath or making high-pitched whistling sounds when you breathe, most often when you breathe out (wheezing). Fast or irregular heartbeats (palpitations). Swelling of the feet and ankles. Fainting. Symptoms of digestive system carcinoids may include: Abdominal pain. Diarrhea or constipation. Inability to have a bowel movement. Nausea or vomiting. Rectal bleeding. Symptoms of lung carcinoids may include: Chest pain. Shortness of breath. Cough, sometimes with blood. How is this diagnosed? Carcinoid tumors that do not cause symptoms may be found during imaging tests or procedures done to diagnose other conditions. If your health care provider suspects a carcinoid tumor, you may have tests to confirm the diagnosis. These may include: A 24-hour urine sample. Blood tests. A type of imaging scan that is done after you get an injection of a radioactive dye that will go to a carcinoid tumor (octreotide scan). Other imaging tests, such as an X-ray, MRI, PET scan, or CT scan. A procedure to remove a sample of the tumor (biopsy) for testing. How is this treated? Treatment depends on the size and location of the tumor. For a small tumor that has not spread to other areas of the body, surgery can be done to remove the tumor. This is the most  common treatment and usually cures this type of cancer. For tumors that are too big to remove or have spread to other parts of the body, treatment options may  include: Surgically removing part of the tumor to make other treatments more effective. Taking medicines such as: A medicine made from a naturally occurring hormone (octreotide). Injections of octreotide may reduce symptoms and reverse tumor growth. A medicine that boosts the immune system (interferon). This medicine may be used to increase natural resistance to cancer. Several types of cancer drugs (chemotherapy). These may be given through an IV or taken by mouth in various combinations. Targeted therapy. This involves using drugs that target specific parts of cancer cells and the area around them to block the growth and spread of the cancer. Using high-energy rays to kill cancer cells (radiation therapy). This may be used for some carcinoid tumors. For carcinoid cancer that has spread to the liver, treatment may include having a procedure that involves using radio waves that destroy cancer, either with heat (radiofrequency ablation) or with freezing (cryoablation). Pellets may be injected to block blood supply to the tumor (embolization). Follow these instructions at home:   Take over-the-counter and prescription medicines only as told by your health care provider. Follow diet instructions from your health care provider. This may include adding more protein to your diet. You may also need to avoid any foods that trigger your symptoms. Do not drink alcohol, especially red wine. Do not use any products that contain nicotine or tobacco. These products include cigarettes, chewing tobacco, and vaping devices, such as e-cigarettes. If you need help quitting, ask your health care provider. Take actions to manage stress, such as doing meditation or deep breathing techniques. Keep all follow-up visits. This is important. Where to find more information [place]: www.cancer.gov Contact a health care provider if you: Develop any symptoms of carcinoid syndrome. Develop any new  symptoms. Get help right away if you have: Chest pain. Trouble breathing. These symptoms may be an emergency. Get help right away. Call 911. Do not wait to see if the symptoms will go away. Do not drive yourself to the hospital. Summary A carcinoid tumor is a rare type of cancer that usually grows very slowly. Slow-growing carcinoid tumors do not cause symptoms for many years. When symptoms do occur, they depend on the location of the tumor and whether the tumor secretes hormones that cause carcinoid syndrome. Carcinoid syndrome may cause flushing, wheezing, palpitations, diarrhea, swelling, and fainting. You may need blood tests, imaging tests, and a biopsy to confirm a diagnosis of carcinoid tumors. Surgery is the best treatment for a carcinoid tumor when it is still small. There are several other treatment options for tumors that are large or have spread to other areas of the body. This information is not intended to replace advice given to you by your health care provider. Make sure you discuss any questions you have with your health care provider. Document Revised: 02/15/2021 Document Reviewed: 02/15/2021 Elsevier Patient Education  [place].

## 2021-06-24 NOTE — Assessment & Plan Note (Addendum)
-   Previously followed long-term by Dr. Pia Mau for known right middle lobe lung nodule.  Recent PET scan on 04/01/2021 showed avid uptake within the right heart further. Patient underwent bronchoscopy with Dr. Valeta Harms on 06/14/21, cytology showed neoplastic cells consistent with neuroendocrine tumor. We reviewed results with patient today and answered all questions. Dr. Kipp Brood did not feel patient would be good candidate for surgery, recommended referral to rad/onc. Patient has been set up for PFTs on January 20th, advised she keeps those to evaluate for underlying COPD/treament.

## 2021-06-27 ENCOUNTER — Telehealth: Payer: Self-pay | Admitting: Radiation Oncology

## 2021-06-27 DIAGNOSIS — R270 Ataxia, unspecified: Secondary | ICD-10-CM | POA: Diagnosis not present

## 2021-06-27 DIAGNOSIS — F17200 Nicotine dependence, unspecified, uncomplicated: Secondary | ICD-10-CM | POA: Diagnosis not present

## 2021-06-27 DIAGNOSIS — M170 Bilateral primary osteoarthritis of knee: Secondary | ICD-10-CM | POA: Diagnosis not present

## 2021-06-29 DIAGNOSIS — R270 Ataxia, unspecified: Secondary | ICD-10-CM | POA: Diagnosis not present

## 2021-06-29 DIAGNOSIS — F17200 Nicotine dependence, unspecified, uncomplicated: Secondary | ICD-10-CM | POA: Diagnosis not present

## 2021-06-29 DIAGNOSIS — M170 Bilateral primary osteoarthritis of knee: Secondary | ICD-10-CM | POA: Diagnosis not present

## 2021-06-29 NOTE — Progress Notes (Signed)
Thoracic Location of Tumor / Histology: Right Middle Lobe Lung  Patient presented for follow-up imaging regarding right middle lobe lung nodule that is under surveillance.  Bronchoscopy 06/14/2021: neoplastic cells consistent with neuroendocrine tumor.  CTA Chest 05/26/2021:Right middle lobe pulmonary nodule of concern on previous CT and previous PET-CT shows no enhancement after IV contrast administration to suggest pulmonary AVM. Nodule does demonstrate a branching configuration (see sagittal image 79 of series 7 and images 51-63 of coronal series 6).  PET 04/01/2021: Markedly hypermetabolic area along the RIGHT heart border shows a maximum SUV of 7.1, this may correspond and appears to correspond to the lobular area immediately adjacent to the RIGHT heart border referenced on previous CT images and reports. However, somewhat indeterminate based on the variable nature of activity that can be associated with the heart. The lobular area along the RIGHT heart border measuring 18 x 12 mm (image 45/8) unchanged compared to the recent chest CT. No additional areas of hypermetabolic activity in the chest. Some respiratory motion and some mosaic attenuation in the chest.   CT Chest 03/04/2021: Similar-appearing, polylobar, solid pulmonary nodule in the medial segment of the middle lobe which measures approximately 1.8 x 1.1 x 2.0 cm (AP by trans by cc). The nodule appears to be in close proximity to a pulmonary artery branch. No new suspicious pulmonary nodules. No focal consolidations, pneumothorax, or pleural effusions.  Biopsies of RML Mass 06/14/2021   Tobacco/Marijuana/Snuff/ETOH use: Current Smoker  Past/Anticipated interventions by pulmonary, if any:  Geraldo Pitter NP 06/24/2021 -She was previously followed long-term by Dr. Pia Mau for known right middle lobe lung nodule.  Recent PET scan on 04/01/2021 showed avid uptake within the right heart further.  Patient underwent robotic  assisted navigational bronchoscopy with Dr. Valeta Harms on 12/6, preliminary pathology felt to be malignant. Single fiducial was placed in proximity of lung lesion post biopsy. Pathology showed neoplastic cells consistent with neuroendocrine tumor.  -Dr. Kipp Brood did not feel patient would be good candidate for surgery, recommended referral to rad/onc. Patient contact is Baldo Ash or [employer].   Past/Anticipated interventions by cardiothoracic surgery, if any:   Past/Anticipated interventions by medical oncology, if any:   Signs/Symptoms Weight changes, if any: Has had some intentional weight loss. Respiratory complaints, if any: SOB noted all the time, worse with sitting in slouched position. Hemoptysis, if any: Productive cough with yellow phlegm, occasional hemoptysis. Pain issues, if any: No   SAFETY ISSUES: Prior radiation? No Pacemaker/ICD?  No Possible current pregnancy? Hysterectomy Is the patient on methotrexate? No  Current Complaints / other details:

## 2021-06-30 ENCOUNTER — Other Ambulatory Visit: Payer: Self-pay

## 2021-06-30 ENCOUNTER — Ambulatory Visit
Admission: RE | Admit: 2021-06-30 | Discharge: 2021-06-30 | Disposition: A | Discharge status: Home / Self Care | Payer: Medicare Other | Source: Ambulatory Visit | Attending: Radiation Oncology | Admitting: Radiation Oncology

## 2021-06-30 ENCOUNTER — Encounter: Payer: Self-pay | Admitting: Radiation Oncology

## 2021-06-30 VITALS — BP 124/76 | HR 87 | Temp 98.0°F | Resp 16 | Ht 70.5 in | Wt 233.6 lb

## 2021-06-30 DIAGNOSIS — I700 Atherosclerosis of aorta: Secondary | ICD-10-CM | POA: Diagnosis not present

## 2021-06-30 DIAGNOSIS — F17210 Nicotine dependence, cigarettes, uncomplicated: Secondary | ICD-10-CM | POA: Diagnosis not present

## 2021-06-30 DIAGNOSIS — R270 Ataxia, unspecified: Secondary | ICD-10-CM | POA: Diagnosis not present

## 2021-06-30 DIAGNOSIS — I252 Old myocardial infarction: Secondary | ICD-10-CM | POA: Diagnosis not present

## 2021-06-30 DIAGNOSIS — C342 Malignant neoplasm of middle lobe, bronchus or lung: Secondary | ICD-10-CM

## 2021-06-30 DIAGNOSIS — G4730 Sleep apnea, unspecified: Secondary | ICD-10-CM | POA: Diagnosis not present

## 2021-06-30 DIAGNOSIS — E119 Type 2 diabetes mellitus without complications: Secondary | ICD-10-CM | POA: Insufficient documentation

## 2021-06-30 DIAGNOSIS — Z79899 Other long term (current) drug therapy: Secondary | ICD-10-CM | POA: Diagnosis not present

## 2021-06-30 DIAGNOSIS — M170 Bilateral primary osteoarthritis of knee: Secondary | ICD-10-CM | POA: Diagnosis not present

## 2021-06-30 DIAGNOSIS — Z8673 Personal history of transient ischemic attack (TIA), and cerebral infarction without residual deficits: Secondary | ICD-10-CM | POA: Diagnosis not present

## 2021-06-30 DIAGNOSIS — F419 Anxiety disorder, unspecified: Secondary | ICD-10-CM | POA: Insufficient documentation

## 2021-06-30 DIAGNOSIS — F17200 Nicotine dependence, unspecified, uncomplicated: Secondary | ICD-10-CM | POA: Diagnosis not present

## 2021-06-30 DIAGNOSIS — I10 Essential (primary) hypertension: Secondary | ICD-10-CM | POA: Diagnosis not present

## 2021-06-30 DIAGNOSIS — K219 Gastro-esophageal reflux disease without esophagitis: Secondary | ICD-10-CM | POA: Insufficient documentation

## 2021-06-30 DIAGNOSIS — K589 Irritable bowel syndrome without diarrhea: Secondary | ICD-10-CM | POA: Diagnosis not present

## 2021-06-30 DIAGNOSIS — F79 Unspecified intellectual disabilities: Secondary | ICD-10-CM | POA: Insufficient documentation

## 2021-06-30 NOTE — Progress Notes (Signed)
Radiation Oncology         [phone] ________________________________  Name: [name]        MRN: [id number]  Date of Service: 06/30/2021 DOB: 09/20/1960  GD:[id number], Elayne Snare, MD  Garner Nash, DO     REFERRING PHYSICIAN: Garner Nash, DO   DIAGNOSIS: There were no encounter diagnoses.   HISTORY OF PRESENT ILLNESS: MAKINZE JANI is a 60 y.o. female seen at the request of Dr. Valeta Harms for newly diagnosed carcinoid tumor in the right lung.  The patient has been followed for many years with what was felt to be a benign nodule in the right lung.  She had a CT chest without contrast on 03/04/2021 that showed a Nodule in the right middle lobe measuring up to 2 cm in close proximity to the pulmonary artery branch.  She underwent a PET scan on 04/01/2021 that showed hypermetabolism with an SUV of 7.1 and this nodule, to rule out AVM in this location, she did also have a CT angiography scan of the chest on 05/26/2021 and there was no post contrast enhancement to suggest vascular anomaly.  She did undergo bronchoscopy on 06/14/2021 cytology from that procedure with Dr.Icard showed neoplastic cells in the fine-needle aspirate consistent with neuroendocrine tumor similar findings were seen in the brushing.  She has been offered surgical resection but is interested in less invasive approach.  She is seen today to discuss stereotactic body radiotherapy (SBRT).    PREVIOUS RADIATION THERAPY: No   PAST MEDICAL HISTORY:  Past Medical History:  Diagnosis Date   Anxiety    Aortic atherosclerosis ([place]) 09/19/2020   Seen on CAT scan 2021   Arthritis    Depression    Diabetes mellitus    Diabetes mellitus, type II ([place])    Dyspnea    Dysrhythmia    GERD (gastroesophageal reflux disease)    Heart murmur    HTN (hypertension)    Hyperglycemia    IBS (irritable bowel syndrome)    Lung nodules    right, followed by PCP, PET 11/2011   Mental retardation    MI (myocardial infarction) ([place])     Migraines    Pneumonia    Sleep apnea    cpap    Stroke [facility])        PAST SURGICAL HISTORY: Past Surgical History:  Procedure Laterality Date   ABDOMINAL HYSTERECTOMY     BIOPSY  01/15/2020   Procedure: BIOPSY;  Surgeon: Daneil Dolin, MD;  [facility];  Service: Endoscopy;;  gastric   BRONCHIAL BIOPSY  06/14/2021   Procedure: BRONCHIAL BIOPSIES;  Surgeon: Garner Nash, DO;  Location: [place] [facility]COPY;  Service: Pulmonary;;   BRONCHIAL BRUSHINGS  06/14/2021   Procedure: BRONCHIAL BRUSHINGS;  Surgeon: Garner Nash, DO;  Location: [place] ENDOSCOPY;  Service: Pulmonary;;   BRONCHIAL NEEDLE ASPIRATION BIOPSY  06/14/2021   Procedure: BRONCHIAL NEEDLE ASPIRATION BIOPSIES;  Surgeon: Garner Nash, DO;  Location: [place] [facility];  Service: Pulmonary;;   COLONOSCOPY  11/2007   hyperplastic polyps, prior hx of adenomas    COLONOSCOPY  05/2010   incomplete due to poor prep, hyperplastic rectal polyp   COLONOSCOPY  05/05/2002   Dimunitive polyps in the rectum and left colon, cold    biopsied/removed.  Scattered few left-sided diverticula.  Regular colonic   mucosa appeared normal   COLONOSCOPY N/A 05/28/2013   Rourk: mulitple tubular adenomas removed. next tcs 05/2016   COLONOSCOPY WITH PROPOFOL N/A 09/07/2016  Dr. Gala Romney: For hyperplastic polyps removed. Next colonoscopy March 2023 given history of adenomatous colon polyps in the past.   ESOPHAGOGASTRODUODENOSCOPY  08/2007   moderate sized hiatal hernia   ESOPHAGOGASTRODUODENOSCOPY  05/2010   noncritical schatzki ring s/p 71F   ESOPHAGOGASTRODUODENOSCOPY (EGD) WITH ESOPHAGEAL DILATION N/A 02/06/2013   EML:JQGBEE esophagus-s/p dilation up to a 86 [place] size with [facility] dilators.  Hiatal hernia   ESOPHAGOGASTRODUODENOSCOPY (EGD) WITH PROPOFOL N/A 09/07/2016   Dr. Gala Romney: Normal, status post empiric dilation of the esophagus for history of dysphagia   ESOPHAGOGASTRODUODENOSCOPY (EGD) WITH PROPOFOL N/A 05/02/2019   Normal esophagus s/p  dilation, normal stomach, normal duodenum   ESOPHAGOGASTRODUODENOSCOPY (EGD) WITH PROPOFOL N/A 01/15/2020   normal esophagus s/p dilation, gastric nodule s/p biopsy. This showed reactive gastropathy with H.pylori.    EXTERNAL EAR SURGERY     bilateral   FIDUCIAL MARKER PLACEMENT  06/14/2021   Procedure: FIDUCIAL MARKER PLACEMENT;  Surgeon: Garner Nash, DO;  Location: [place] ENDOSCOPY;  Service: Pulmonary;;   FOOT SURGERY     GLAUCOMA SURGERY     KNEE ARTHROPLASTY Right 11/04/2020   Procedure: COMPUTER ASSISTED TOTAL KNEE ARTHROPLASTY;  Surgeon: Rod Can, MD;  Location: [facility];  Service: Orthopedics;  Laterality: Right;   MALONEY DILATION N/A 09/07/2016   Procedure: Venia Minks DILATION;  Surgeon: Daneil Dolin, MD;  [facility];  Service: Endoscopy;  Laterality: N/A;   MALONEY DILATION N/A 05/02/2019   Procedure: Venia Minks DILATION;  Surgeon: Daneil Dolin, MD;  [facility];  Service: Endoscopy;  Laterality: N/A;   MALONEY DILATION N/A 01/15/2020   Procedure: Venia Minks DILATION;  Surgeon: Daneil Dolin, MD;  [facility];  Service: Endoscopy;  Laterality: N/A;   POLYPECTOMY  09/07/2016   Procedure: POLYPECTOMY;  Surgeon: Daneil Dolin, MD;  [facility];  Service: Endoscopy;;  sigmoid colon x4   small bowel capsule  10/2007   normal   VIDEO BRONCHOSCOPY WITH RADIAL ENDOBRONCHIAL ULTRASOUND  06/14/2021   Procedure: VIDEO BRONCHOSCOPY WITH RADIAL ENDOBRONCHIAL ULTRASOUND;  Surgeon: Garner Nash, DO;  [facility];  Service: Pulmonary;;     FAMILY HISTORY:  Family History  Problem Relation Age of Onset   Stroke Mother    Heart attack Father    Schizophrenia Other    Drug abuse Other    Alcohol abuse Other    Colon cancer Other        aunt   Obesity Other    COPD Other    Anxiety disorder Other    GER disease Other    Diabetes type II Other    Anxiety disorder Other    Depression Other    Depression Sister    Schizophrenia  Sister    Liver disease Neg Hx    Inflammatory bowel disease Neg Hx      SOCIAL HISTORY:  reports that she has been smoking cigarettes. She has a 20.00 pack-year smoking history. She has never used smokeless tobacco. She reports that she does not drink alcohol and does not use drugs.The patient is divorced    ALLERGIES: Thorazine [chlorpromazine hcl], Acetaminophen, Aspirin, Aspirin-acetaminophen-caffeine, Nsaids, Other, Penicillins, and Tomato   MEDICATIONS:  Current Outpatient Medications  Medication Sig Dispense Refill   atorvastatin (LIPITOR) 10 MG tablet TAKE 1 TABLET BY MOUTH ONCE A DAY. 30 tablet 5   benztropine (COGENTIN) 1 MG tablet Take 1 tablet (1 mg total) by mouth daily. 90 tablet 2   blood glucose meter  kit and supplies Dispense based on patient and insurance preference. Use to test glucose once daily. (FOR ICD10, E11.9). 1 each 0   cycloSPORINE (RESTASIS) 0.05 % ophthalmic emulsion Place 1 drop into both eyes 2 (two) times daily.     dicyclomine (BENTYL) 10 MG capsule TAKE 1 CAPSULE BY MOUTH UP TO 3 TIMES DAILY BEFORE MEALS AS NEEDED FOR ABDOMINAL CRAMPS/LOOSE STOOLS. 90 capsule 3   docusate sodium (COLACE) 100 MG capsule TAKE 1 CAPSULE BY MOUTH TWICE DAILY 60 capsule 5   lamoTRIgine (LAMICTAL) 100 MG tablet Take 1 tablet (100 mg total) by mouth 2 (two) times daily. 180 tablet 2   loperamide (IMODIUM) 2 MG capsule Take 2 mg by mouth 4 (four) times daily as needed for diarrhea or loose stools.     ondansetron (ZOFRAN) 4 MG tablet Take 1 tablet (4 mg total) by mouth every 6 (six) hours as needed for nausea. 20 tablet 0   paliperidone (INVEGA) 9 MG 24 hr tablet Take 1 tablet (9 mg total) by mouth daily. 90 tablet 2   potassium chloride (KLOR-CON) 10 MEQ tablet TAKE 2 TABLETS (20MEQ) BY MOUTH TWICE DAILY WITH FOOD 120 tablet 0   propranolol (INDERAL) 10 MG tablet TAKE 1 TABLET BY MOUTH THREE TIMES A DAY. 90 tablet 0   RABEprazole (ACIPHEX) 20 MG tablet Take 1 tablet (20 mg  total) by mouth 2 (two) times daily before a meal. 180 tablet 3   senna (SENNA-TABS) 8.6 MG tablet TAKE 1 TABLET BY MOUTH 2 TIMES A DAY 60 tablet 5   sertraline (ZOLOFT) 100 MG tablet TAKE 1 TABLET BY MOUTH ONCE A DAY. 30 tablet 0   torsemide (DEMADEX) 20 MG tablet TAKE (1) TABLET BY MOUTH EACH MORNING. 30 tablet 5   traZODone (DESYREL) 100 MG tablet TAKE 2 TABLETS($RemoveBefore'200MG'TGXanpDSwuZXT$ ) BY MOUTH AT BEDTIME. 60 tablet 0   No current facility-administered medications for this encounter.     REVIEW OF SYSTEMS: On review of systems, the patient reports that she is doing pretty well overall.  She is accompanied today by her guardian and social worker [name] who she has worked with for the last 5 years.  The patient does have developmental delay but still is able to consent herself.  [name] and Sharyn Lull Bullins also work together to help with her Conservation officer, nature.  The patient states that she has had intentional weight loss and has shortness of breath especially when she is seated or in a slouched position.  She is not wheelchair dependent however and is able to walk and get around without difficulty.  She has a productive cough with yellow phlegm and since her biopsy a few episodes of hemoptysis.  She does have pain with positioning her arms for imaging tests.  Her threshold for pain according to [name] is quite low.  No other complaints are verbalized.  PHYSICAL EXAM:  Wt Readings from Last 3 Encounters:  06/30/21 233 lb 9.6 oz (106 kg)  06/24/21 229 lb 6.4 oz (104.1 kg)  06/14/21 230 lb (104.3 kg)   Temp Readings from Last 3 Encounters:  06/30/21 98 F (36.7 C)  06/15/21 98.3 F (36.8 C) (Oral)  06/09/21 (!) 97.5 F (36.4 C)   BP Readings from Last 3 Encounters:  06/30/21 124/76  06/24/21 130/86  06/15/21 94/75   Pulse Readings from Last 3 Encounters:  06/30/21 87  06/24/21 71  06/15/21 64   Pain Assessment Pain Score: 3 /10  In general this is a well appearing African  American female in  no acute distress. She's alert and oriented x4 and appropriate throughout the examination. Cardiopulmonary assessment is negative for acute distress and she exhibits normal effort.     ECOG = 0  0 - Asymptomatic (Fully active, able to carry on all predisease activities without restriction)  1 - Symptomatic but completely ambulatory (Restricted in physically strenuous activity but ambulatory and able to carry out work of a light or sedentary nature. For example, light housework, office work)  2 - Symptomatic, <50% in bed during the day (Ambulatory and capable of all self care but unable to carry out any work activities. Up and about more than 50% of waking hours)  3 - Symptomatic, >50% in bed, but not bedbound (Capable of only limited self-care, confined to bed or chair 50% or more of waking hours)  4 - Bedbound (Completely disabled. Cannot carry on any self-care. Totally confined to bed or chair)  5 - Death   Eustace Pen MM, Creech RH, Tormey DC, et al. [phone]). "Toxicity and response criteria of the [facility]". [place] Oncol. 5 (6): 649-55    LABORATORY DATA:  Lab Results  Component Value Date   WBC 5.5 06/15/2021   HGB 12.1 06/15/2021   HCT 37.1 06/15/2021   MCV 93.5 06/15/2021   PLT 152 06/15/2021   Lab Results  Component Value Date   NA 140 06/09/2021   K 4.3 06/09/2021   CL 97 06/09/2021   CO2 29 06/09/2021   Lab Results  Component Value Date   ALT 10 10/25/2020   AST 19 10/25/2020   ALKPHOS 75 10/25/2020   BILITOT 0.7 10/25/2020      RADIOGRAPHY: DG CHEST PORT 1 VIEW  Result Date: 06/15/2021 CLINICAL DATA:  60 year old female with history of COPD exacerbation. EXAM: PORTABLE CHEST 1 VIEW COMPARISON:  Chest x-ray 06/14/2021. FINDINGS: Lung volumes are normal. No consolidative airspace disease. No pleural effusions. No pneumothorax. No pulmonary nodule or mass noted. Pulmonary vasculature and the cardiomediastinal silhouette are within normal  limits. Atherosclerosis in the thoracic aorta. Metallic density projects over the infrahilar region of the right lung, similar to prior examination, presumably a biopsy clip. IMPRESSION: 1.  No radiographic evidence of acute cardiopulmonary disease. 2. Aortic atherosclerosis. Electronically Signed   By: Vinnie Langton M.D.   On: 06/15/2021 05:52   DG CHEST PORT 1 VIEW  Result Date: 06/14/2021 CLINICAL DATA:  Bronchoscopy EXAM: PORTABLE CHEST 1 VIEW COMPARISON:  CT chest, 05/26/2021 FINDINGS: The heart size and mediastinal contours are within normal limits. Nodular opacity of the medial segment right middle lobe with biopsy marking clip. No pneumothorax. The visualized skeletal structures are unremarkable. IMPRESSION: 1. Nodular opacity of the medial segment right middle lobe with biopsy marking clip, better demonstrated by prior CT. 2.  No pneumothorax. Electronically Signed   By: Delanna Ahmadi M.D.   On: 06/14/2021 14:12   DG C-ARM BRONCHOSCOPY  Result Date: 06/14/2021 C-ARM BRONCHOSCOPY: Fluoroscopy was utilized by the requesting physician.  No radiographic interpretation.       IMPRESSION/PLAN: 1. Carcinoid tumor in the right middle lobe.  The patient is counseled on findings and pathology.  Dr. Lisbeth Renshaw discusses the rationale for treatment of this tumor, she does not have systemic symptoms of neuroendocrine disease fortunately.  We discussed stereotactic body radiotherapy  (SBRT) as well as the delivery and logistics of treatment, the side effects short and long term effects as well as into the anticipated findings after treatment.  Dr.  Moody anticipates 5 fractions due to the location of her nodule.  We discussed the surveillance schedule of CT scans at 58-month intervals for 5 years similar to non-small cell lung cancers.  She is in agreement with this plan.  Her guardian is also in agreement.  We will contact her for simulation to coordinate planning for radiotherapy in the coming weeks.Written  consent is obtained and placed in the chart, a copy was provided to the patient.   In a visit lasting 60 minutes, greater than 50% of the time was spent face to face discussing the patient's condition, in preparation for the discussion, and coordinating the patient's care.   The above documentation reflects my direct findings during this shared patient visit. Please see the separate note by Dr. Lisbeth Renshaw on this date for the remainder of the patient's plan of care.    Carola Rhine, [facility]   **Disclaimer: This note was dictated with voice recognition software. Similar sounding words can inadvertently be transcribed and this note may contain transcription errors which may not have been corrected upon publication of note.**

## 2021-07-05 DIAGNOSIS — C342 Malignant neoplasm of middle lobe, bronchus or lung: Secondary | ICD-10-CM | POA: Diagnosis not present

## 2021-07-05 DIAGNOSIS — F17210 Nicotine dependence, cigarettes, uncomplicated: Secondary | ICD-10-CM | POA: Diagnosis not present

## 2021-07-07 ENCOUNTER — Telehealth: Payer: Self-pay | Admitting: *Deleted

## 2021-07-07 NOTE — Telephone Encounter (Signed)
Spoke with the patients guardian [name] to inform her that she did not need to proceed with the pft's scheduled 07/29/2021 per Dr. Valeta Harms.  [facility] has been made aware of appointment cancellation.  Gloriajean Dell. Leonie Green, BSN

## 2021-07-14 ENCOUNTER — Other Ambulatory Visit: Payer: Self-pay

## 2021-07-14 ENCOUNTER — Telehealth: Payer: Self-pay | Admitting: Family Medicine

## 2021-07-14 ENCOUNTER — Ambulatory Visit
Admission: RE | Admit: 2021-07-14 | Discharge: 2021-07-14 | Disposition: A | Discharge status: Home / Self Care | Payer: Medicare Other | Source: Ambulatory Visit | Attending: Radiation Oncology | Admitting: Radiation Oncology

## 2021-07-14 DIAGNOSIS — Z510 Encounter for antineoplastic radiation therapy: Secondary | ICD-10-CM | POA: Insufficient documentation

## 2021-07-14 DIAGNOSIS — C342 Malignant neoplasm of middle lobe, bronchus or lung: Secondary | ICD-10-CM | POA: Insufficient documentation

## 2021-07-14 DIAGNOSIS — F17210 Nicotine dependence, cigarettes, uncomplicated: Secondary | ICD-10-CM | POA: Diagnosis not present

## 2021-07-14 NOTE — Telephone Encounter (Signed)
Form placed in providers box for review. Please advise. Thank you.

## 2021-07-14 NOTE — Telephone Encounter (Signed)
[name] [name] dropped off a [employer] [place] Form. Placed in the nurse box.   [phone]

## 2021-07-15 ENCOUNTER — Telehealth: Payer: Self-pay | Admitting: Family Medicine

## 2021-07-15 NOTE — Telephone Encounter (Signed)
Patient is requesting a prescription for shower chair. [name] will be by to pick up when ready.please advise

## 2021-07-15 NOTE — Telephone Encounter (Signed)
Script written out and placed on provider door for signature. [name] (Education officer, museum) aware that PCP is out of office. Will call when ready to be picked up. [name] verbalized understanding.  Please advise. Thank you.

## 2021-07-18 NOTE — Telephone Encounter (Signed)
Left message to return call [name](social worker)

## 2021-07-18 NOTE — Telephone Encounter (Signed)
Script up front for pick up. Telephone call no answer for [name](social worker)

## 2021-07-18 NOTE — Telephone Encounter (Signed)
[name](social worker) notified.

## 2021-07-18 NOTE — Telephone Encounter (Signed)
This was signed thank you

## 2021-07-19 ENCOUNTER — Other Ambulatory Visit: Payer: Self-pay

## 2021-07-19 ENCOUNTER — Ambulatory Visit: Payer: Commercial Managed Care - HMO

## 2021-07-19 DIAGNOSIS — M2041 Other hammer toe(s) (acquired), right foot: Secondary | ICD-10-CM

## 2021-07-19 DIAGNOSIS — M2011 Hallux valgus (acquired), right foot: Secondary | ICD-10-CM

## 2021-07-19 DIAGNOSIS — E1142 Type 2 diabetes mellitus with diabetic polyneuropathy: Secondary | ICD-10-CM

## 2021-07-19 NOTE — Progress Notes (Signed)
SITUATION Reason for Consult: Evaluation for Prefabricated Diabetic Shoes and Bilateral Custom Diabetic Inserts. Patient / Caregiver Report: Patient wants comfortable shoes  OBJECTIVE DATA: Patient History / Diagnosis:    ICD-10-CM   1. Diabetic polyneuropathy associated with type 2 diabetes mellitus (HCC)  E11.42     2. Hammer toe of right foot  M20.41     3. Hallux valgus, acquired, bilateral  M20.11    M20.12       Presence of Diabetic Complications: - Peripheral Neuropathy  Current or Previous Devices: None and no history  In-Person Foot Examination:  Skin presentation:   Thin, Shiny, Hairless Nail presentation:   Thick, Ingrown, With Fungus Ulcers & Callousing:   None  Shoe Size: 10XW  ORTHOTIC RECOMMENDATION Recommended Devices: - 1x pair prefabricated PDAC approved diabetic shoes: [id number] 10XW - 3x pair custom-to-patient vacuum formed diabetic insoles.   GOALS OF SHOES AND INSOLES - Reduce shear and pressure - Reduce / Prevent callus formation - Reduce / Prevent ulceration - Protect the fragile healing compromised diabetic foot.  Patient would benefit from diabetic shoes and inserts as patient has diabetes mellitus and the patient has one or more of the following conditions: - Peripheral neuropathy with evidence of callus formation - Foot deformity - Poor circulation  ACTIONS PERFORMED Patient was casted for insoles via crush box and measured for shoes via brannock device. Procedure was explained and patient tolerated procedure well. All questions were answered and concerns addressed.  PLAN Casts to be sent to [facility] for hold and for fabrication. Patient is to be called for fitting when devices are ready.

## 2021-07-21 DIAGNOSIS — R531 Weakness: Secondary | ICD-10-CM | POA: Diagnosis not present

## 2021-07-26 ENCOUNTER — Other Ambulatory Visit: Payer: Self-pay | Admitting: Gastroenterology

## 2021-07-26 ENCOUNTER — Encounter: Payer: Self-pay | Admitting: Internal Medicine

## 2021-07-26 ENCOUNTER — Other Ambulatory Visit (HOSPITAL_COMMUNITY): Payer: Self-pay | Admitting: Psychiatry

## 2021-07-26 ENCOUNTER — Ambulatory Visit: Payer: Medicare Other | Admitting: Radiation Oncology

## 2021-07-26 NOTE — Telephone Encounter (Signed)
Call for appt

## 2021-07-27 ENCOUNTER — Ambulatory Visit: Payer: Medicare Other | Admitting: Radiation Oncology

## 2021-07-28 ENCOUNTER — Ambulatory Visit: Payer: Medicare Other | Admitting: Radiation Oncology

## 2021-07-28 DIAGNOSIS — C342 Malignant neoplasm of middle lobe, bronchus or lung: Secondary | ICD-10-CM | POA: Diagnosis not present

## 2021-07-28 DIAGNOSIS — Z510 Encounter for antineoplastic radiation therapy: Secondary | ICD-10-CM | POA: Diagnosis not present

## 2021-07-28 DIAGNOSIS — F17210 Nicotine dependence, cigarettes, uncomplicated: Secondary | ICD-10-CM | POA: Diagnosis not present

## 2021-07-29 ENCOUNTER — Other Ambulatory Visit: Payer: Self-pay

## 2021-07-29 ENCOUNTER — Telehealth (HOSPITAL_COMMUNITY): Payer: Self-pay | Admitting: Psychiatry

## 2021-07-31 NOTE — Telephone Encounter (Signed)
Form completed.

## 2021-08-01 ENCOUNTER — Ambulatory Visit
Admission: RE | Admit: 2021-08-01 | Discharge: 2021-08-01 | Disposition: A | Discharge status: Home / Self Care | Payer: Medicare Other | Source: Ambulatory Visit | Attending: Radiation Oncology | Admitting: Radiation Oncology

## 2021-08-01 ENCOUNTER — Other Ambulatory Visit: Payer: Self-pay

## 2021-08-01 DIAGNOSIS — C342 Malignant neoplasm of middle lobe, bronchus or lung: Secondary | ICD-10-CM | POA: Diagnosis not present

## 2021-08-01 DIAGNOSIS — Z510 Encounter for antineoplastic radiation therapy: Secondary | ICD-10-CM | POA: Diagnosis not present

## 2021-08-01 NOTE — Telephone Encounter (Signed)
Upfront for pick up, [name] was notified form ready.

## 2021-08-02 ENCOUNTER — Ambulatory Visit: Payer: Medicare Other | Admitting: Radiation Oncology

## 2021-08-03 ENCOUNTER — Ambulatory Visit: Admission: RE | Admit: 2021-08-03 | Payer: Medicare Other | Source: Ambulatory Visit | Admitting: Radiation Oncology

## 2021-08-03 ENCOUNTER — Other Ambulatory Visit: Payer: Self-pay

## 2021-08-05 ENCOUNTER — Other Ambulatory Visit: Payer: Self-pay

## 2021-08-05 ENCOUNTER — Ambulatory Visit
Admission: RE | Admit: 2021-08-05 | Discharge: 2021-08-05 | Disposition: A | Discharge status: Home / Self Care | Payer: Medicare Other | Source: Ambulatory Visit | Attending: Radiation Oncology | Admitting: Radiation Oncology

## 2021-08-05 DIAGNOSIS — Z510 Encounter for antineoplastic radiation therapy: Secondary | ICD-10-CM | POA: Diagnosis not present

## 2021-08-05 DIAGNOSIS — C342 Malignant neoplasm of middle lobe, bronchus or lung: Secondary | ICD-10-CM | POA: Diagnosis not present

## 2021-08-08 ENCOUNTER — Ambulatory Visit
Admission: RE | Admit: 2021-08-08 | Discharge: 2021-08-08 | Disposition: A | Discharge status: Home / Self Care | Payer: Medicare Other | Source: Ambulatory Visit | Attending: Radiation Oncology | Admitting: Radiation Oncology

## 2021-08-08 ENCOUNTER — Other Ambulatory Visit: Payer: Self-pay

## 2021-08-08 DIAGNOSIS — C342 Malignant neoplasm of middle lobe, bronchus or lung: Secondary | ICD-10-CM | POA: Diagnosis not present

## 2021-08-08 DIAGNOSIS — Z510 Encounter for antineoplastic radiation therapy: Secondary | ICD-10-CM | POA: Diagnosis not present

## 2021-08-09 ENCOUNTER — Ambulatory Visit: Payer: Medicare Other | Admitting: Radiation Oncology

## 2021-08-10 ENCOUNTER — Ambulatory Visit
Admission: RE | Admit: 2021-08-10 | Discharge: 2021-08-10 | Disposition: A | Discharge status: Home / Self Care | Payer: Medicare Other | Source: Ambulatory Visit | Attending: Radiation Oncology | Admitting: Radiation Oncology

## 2021-08-10 ENCOUNTER — Other Ambulatory Visit: Payer: Self-pay

## 2021-08-10 DIAGNOSIS — D3A090 Benign carcinoid tumor of the bronchus and lung: Secondary | ICD-10-CM | POA: Diagnosis not present

## 2021-08-11 ENCOUNTER — Ambulatory Visit: Payer: Medicare Other | Admitting: Radiation Oncology

## 2021-08-11 ENCOUNTER — Ambulatory Visit: Payer: Medicare Other | Admitting: Gastroenterology

## 2021-08-12 ENCOUNTER — Ambulatory Visit: Payer: Medicare Other | Admitting: Radiation Oncology

## 2021-08-12 ENCOUNTER — Telehealth: Payer: Self-pay | Admitting: Podiatry

## 2021-08-12 NOTE — Telephone Encounter (Signed)
Diabetic shoes/inserts in.. called pt to schedule an appt and it just keeps ringing. No voicemail.Marland Kitchen

## 2021-08-15 ENCOUNTER — Ambulatory Visit: Payer: Medicare Other | Admitting: Radiation Oncology

## 2021-08-16 ENCOUNTER — Ambulatory Visit
Admission: RE | Admit: 2021-08-16 | Discharge: 2021-08-16 | Disposition: A | Discharge status: Home / Self Care | Payer: Medicare Other | Source: Ambulatory Visit | Attending: Radiation Oncology | Admitting: Radiation Oncology

## 2021-08-16 ENCOUNTER — Ambulatory Visit: Payer: Medicare Other | Admitting: Radiation Oncology

## 2021-08-16 DIAGNOSIS — C342 Malignant neoplasm of middle lobe, bronchus or lung: Secondary | ICD-10-CM | POA: Diagnosis not present

## 2021-08-16 DIAGNOSIS — D3A090 Benign carcinoid tumor of the bronchus and lung: Secondary | ICD-10-CM | POA: Diagnosis not present

## 2021-08-16 DIAGNOSIS — F17210 Nicotine dependence, cigarettes, uncomplicated: Secondary | ICD-10-CM | POA: Diagnosis not present

## 2021-08-16 NOTE — Progress Notes (Signed)
°  Radiation Oncology         [phone] ________________________________  Name: [name] MRN: [id number]  Date: 08/16/2021  DOB: 1960-09-08  End of Treatment Note  Diagnosis:   Carcinoid tumor in the right middle lobe   Indication for treatment:  Curative       Radiation treatment dates:   08/01/21-08/16/21  Site/dose:   The tumor in the RML was treated with a course of stereotactic body radiation treatment. The patient received 50 Gy In 5 fractions at 10 Gy per fraction.  Narrative: The patient tolerated radiation treatment relatively well.   The patient did not have any signs of acute toxicity during treatment.  Plan: The patient will receive a call in about one month from the radiation oncology department. I will order a CT chest for 6-8 weeks from now and follow up with this.  ------------------------------------------------    Carola Rhine, PAC

## 2021-08-18 ENCOUNTER — Ambulatory Visit: Payer: Medicare Other | Admitting: Radiation Oncology

## 2021-08-22 ENCOUNTER — Other Ambulatory Visit: Payer: Self-pay | Admitting: Radiation Oncology

## 2021-08-22 DIAGNOSIS — D3A090 Benign carcinoid tumor of the bronchus and lung: Secondary | ICD-10-CM

## 2021-08-24 ENCOUNTER — Other Ambulatory Visit: Payer: Self-pay | Admitting: Family Medicine

## 2021-08-24 ENCOUNTER — Other Ambulatory Visit (HOSPITAL_COMMUNITY): Payer: Self-pay | Admitting: Psychiatry

## 2021-08-24 DIAGNOSIS — E785 Hyperlipidemia, unspecified: Secondary | ICD-10-CM

## 2021-08-24 MED ORDER — TORSEMIDE 20 MG PO TABS: ORAL_TABLET | ORAL | 0 refills | Status: DC

## 2021-08-24 NOTE — Telephone Encounter (Signed)
Needs appt, last refill without appt

## 2021-08-31 ENCOUNTER — Ambulatory Visit (INDEPENDENT_AMBULATORY_CARE_PROVIDER_SITE_OTHER): Payer: Medicare Other | Admitting: Psychiatry

## 2021-08-31 ENCOUNTER — Other Ambulatory Visit: Payer: Self-pay

## 2021-08-31 ENCOUNTER — Encounter (HOSPITAL_COMMUNITY): Payer: Self-pay | Admitting: Psychiatry

## 2021-08-31 VITALS — BP 132/84 | HR 84 | Temp 97.2°F | Ht 70.5 in | Wt 236.0 lb

## 2021-08-31 DIAGNOSIS — F251 Schizoaffective disorder, depressive type: Secondary | ICD-10-CM

## 2021-08-31 MED ORDER — BENZTROPINE MESYLATE 1 MG PO TABS: 1.0000 mg | ORAL_TABLET | Freq: Every day | ORAL | 5 refills | Status: DC

## 2021-08-31 MED ORDER — LAMOTRIGINE 100 MG PO TABS: 100.0000 mg | ORAL_TABLET | Freq: Two times a day (BID) | ORAL | 5 refills | Status: DC

## 2021-08-31 MED ORDER — TRAZODONE HCL 100 MG PO TABS: ORAL_TABLET | ORAL | 5 refills | Status: DC

## 2021-08-31 MED ORDER — PALIPERIDONE ER 9 MG PO TB24: 9.0000 mg | ORAL_TABLET | Freq: Every day | ORAL | 5 refills | Status: DC

## 2021-08-31 MED ORDER — SERTRALINE HCL 100 MG PO TABS
100.0000 mg | ORAL_TABLET | Freq: Every day | ORAL | 5 refills | Status: DC
Start: 2021-08-31 — End: 2022-02-20

## 2021-08-31 NOTE — Progress Notes (Deleted)
BH MD/PA/NP OP Progress Note  08/31/2021 10:28 AM [name]  MRN:  [id number]  Chief Complaint:  Chief Complaint  Patient presents with   Depression   Anxiety   Schizophrenia   Follow-up   HPI: *** Visit Diagnosis:    ICD-10-CM   1. Schizoaffective disorder, depressive type ([place])  F25.1       Past Psychiatric History: ***  Past Medical History:  Past Medical History:  Diagnosis Date   Anxiety    Aortic atherosclerosis ([place]) 09/19/2020   Seen on CAT scan 2021   Arthritis    Depression    Diabetes mellitus    Diabetes mellitus, type II ([place])    Dyspnea    Dysrhythmia    GERD (gastroesophageal reflux disease)    Heart murmur    HTN (hypertension)    Hyperglycemia    IBS (irritable bowel syndrome)    Lung nodules    right, followed by PCP, PET 11/2011   Mental retardation    MI (myocardial infarction) ([place])    Migraines    Pneumonia    Sleep apnea    cpap    Stroke [facility])     Past Surgical History:  Procedure Laterality Date   ABDOMINAL HYSTERECTOMY     BIOPSY  01/15/2020   Procedure: BIOPSY;  Surgeon: Daneil Dolin, MD;  [facility];  Service: Endoscopy;;  gastric   BRONCHIAL BIOPSY  06/14/2021   Procedure: BRONCHIAL BIOPSIES;  Surgeon: Garner Nash, DO;  Location: [facility];  Service: Pulmonary;;   BRONCHIAL BRUSHINGS  06/14/2021   Procedure: BRONCHIAL BRUSHINGS;  Surgeon: Garner Nash, DO;  Location: [facility];  Service: Pulmonary;;   BRONCHIAL NEEDLE ASPIRATION BIOPSY  06/14/2021   Procedure: BRONCHIAL NEEDLE ASPIRATION BIOPSIES;  Surgeon: Garner Nash, DO;  Location: [facility];  Service: Pulmonary;;   COLONOSCOPY  11/2007   hyperplastic polyps, prior hx of adenomas    COLONOSCOPY  05/2010   incomplete due to poor prep, hyperplastic rectal polyp   COLONOSCOPY  05/05/2002   Dimunitive polyps in the rectum and left colon, cold    biopsied/removed.  Scattered few left-sided diverticula.  Regular colonic   mucosa appeared  normal   COLONOSCOPY N/A 05/28/2013   Rourk: mulitple tubular adenomas removed. next tcs 05/2016   COLONOSCOPY WITH PROPOFOL N/A 09/07/2016   Dr. Gala Romney: For hyperplastic polyps removed. Next colonoscopy March 2023 given history of adenomatous colon polyps in the past.   ESOPHAGOGASTRODUODENOSCOPY  08/2007   moderate sized hiatal hernia   ESOPHAGOGASTRODUODENOSCOPY  05/2010   noncritical schatzki ring s/p 66F   ESOPHAGOGASTRODUODENOSCOPY (EGD) WITH ESOPHAGEAL DILATION N/A 02/06/2013   [facility]:RFVOHK esophagus-s/p dilation up to a 2 [place] size with [facility] dilators.  Hiatal hernia   ESOPHAGOGASTRODUODENOSCOPY (EGD) WITH PROPOFOL N/A 09/07/2016   Dr. Gala Romney: Normal, status post empiric dilation of the esophagus for history of dysphagia   ESOPHAGOGASTRODUODENOSCOPY (EGD) WITH PROPOFOL N/A 05/02/2019   Normal esophagus s/p dilation, normal stomach, normal duodenum   ESOPHAGOGASTRODUODENOSCOPY (EGD) WITH PROPOFOL N/A 01/15/2020   normal esophagus s/p dilation, gastric nodule s/p biopsy. This showed reactive gastropathy with H.pylori.    EXTERNAL EAR SURGERY     bilateral   FIDUCIAL MARKER PLACEMENT  06/14/2021   Procedure: FIDUCIAL MARKER PLACEMENT;  Surgeon: Garner Nash, DO;  Location: [facility];  Service: Pulmonary;;   FOOT SURGERY     GLAUCOMA SURGERY     KNEE ARTHROPLASTY Right 11/04/2020   Procedure: COMPUTER ASSISTED TOTAL KNEE  ARTHROPLASTY;  Surgeon: Samson Frederic, MD;  Location: [facility];  Service: Orthopedics;  Laterality: Right;   MALONEY DILATION N/A 09/07/2016   Procedure: Elease Hashimoto DILATION;  Surgeon: Corbin Ade, MD;  [facility];  Service: Endoscopy;  Laterality: N/A;   MALONEY DILATION N/A 05/02/2019   Procedure: Elease Hashimoto DILATION;  Surgeon: Corbin Ade, MD;  [facility];  Service: Endoscopy;  Laterality: N/A;   MALONEY DILATION N/A 01/15/2020   Procedure: Elease Hashimoto DILATION;  Surgeon: Corbin Ade, MD;  [facility];  Service: Endoscopy;   Laterality: N/A;   POLYPECTOMY  09/07/2016   Procedure: POLYPECTOMY;  Surgeon: Corbin Ade, MD;  [facility];  Service: Endoscopy;;  sigmoid colon x4   small bowel capsule  10/2007   normal   VIDEO BRONCHOSCOPY WITH RADIAL ENDOBRONCHIAL ULTRASOUND  06/14/2021   Procedure: VIDEO BRONCHOSCOPY WITH RADIAL ENDOBRONCHIAL ULTRASOUND;  Surgeon: Josephine Igo, DO;  [facility];  Service: Pulmonary;;    Family Psychiatric History: ***  Family History:  Family History  Problem Relation Age of Onset   Stroke Mother    Heart attack Father    Schizophrenia Other    Drug abuse Other    Alcohol abuse Other    Colon cancer Other        aunt   Obesity Other    COPD Other    Anxiety disorder Other    GER disease Other    Diabetes type II Other    Anxiety disorder Other    Depression Other    Depression Sister    Schizophrenia Sister    Liver disease Neg Hx    Inflammatory bowel disease Neg Hx     Social History:  Social History   Socioeconomic History   Marital status: Divorced    Spouse name: Not on file   Number of children: Not on file   Years of education: Not on file   Highest education level: High school graduate  Occupational History   Occupation: disabled    Employer: UNEMPLOYED  Tobacco Use   Smoking status: Every Day    Packs/day: 0.50    Years: 40.00    Pack years: 20.00    Types: Cigarettes   Smokeless tobacco: Never  Vaping Use   Vaping Use: Never used  Substance and Sexual Activity   Alcohol use: No   Drug use: No   Sexual activity: Not Currently    Partners: Male    Birth control/protection: Surgical    Comment: hyst  Other Topics Concern   Not on file  Social History Narrative   Not on file   Social Determinants of Health   Financial Resource Strain: Low Risk    Difficulty of Paying Living Expenses: Not hard at all  Food Insecurity: No Food Insecurity   Worried About Programme researcher, broadcasting/film/video in the Last Year: Never true   Ran  Out of Food in the Last Year: Never true  Transportation Needs: No Transportation Needs   Lack of Transportation (Medical): No   Lack of Transportation (Non-Medical): No  Physical Activity: Sufficiently Active   Days of Exercise per Week: 5 days   Minutes of Exercise per Session: 40 min  Stress: No Stress Concern Present   Feeling of Stress : Only a little  Social Connections: Not on file    Allergies:  Allergies  Allergen Reactions   Thorazine [Chlorpromazine Hcl] Anaphylaxis   Acetaminophen Other (See Comments)  Makes pt dizzy   Aspirin Other (See Comments)    seizure   Aspirin-Acetaminophen-Caffeine Other (See Comments)    seizure   Nsaids Nausea And Vomiting   Other     Acidic foods   Penicillins Nausea And Vomiting    Has patient had a PCN reaction causing immediate rash, facial/tongue/throat swelling, SOB or lightheadedness with hypotension:Yes Has patient had a PCN reaction causing severe rash involving mucus membranes or skin necrosis:Yes Has patient had a PCN reaction that required hospitalization:Yes Has patient had a PCN reaction occurring within the last 10 years:No If all of the above answers are "NO", then may proceed with Cephalosporin use.    Tomato Rash    Metabolic Disorder Labs: Lab Results  Component Value Date   HGBA1C 6.1 (H) 06/09/2021   MPG 126 10/25/2020   No results found for: PROLACTIN Lab Results  Component Value Date   CHOL 134 03/03/2021   TRIG 58 03/03/2021   HDL 74 03/03/2021   CHOLHDL 1.8 03/03/2021   VLDL 21 07/20/2014   LDLCALC 48 03/03/2021   LDLCALC 42 07/16/2020   Lab Results  Component Value Date   TSH 4.492 03/22/2019   TSH 3.180 02/28/2018    Therapeutic Level Labs: No results found for: LITHIUM No results found for: VALPROATE No components found for:  CBMZ  Current Medications: Current Outpatient Medications  Medication Sig Dispense Refill   atorvastatin (LIPITOR) 10 MG tablet TAKE 1 TABLET BY MOUTH ONCE A  DAY. 30 tablet 1   benztropine (COGENTIN) 1 MG tablet Take 1 tablet (1 mg total) by mouth daily. 30 tablet 5   blood glucose meter kit and supplies Dispense based on patient and insurance preference. Use to test glucose once daily. (FOR ICD10, E11.9). (Patient not taking: Reported on 08/31/2021) 1 each 0   cycloSPORINE (RESTASIS) 0.05 % ophthalmic emulsion Place 1 drop into both eyes 2 (two) times daily.     dicyclomine (BENTYL) 10 MG capsule TAKE 1 CAPSULE BY MOUTH UP TO 3 TIMES DAILY BEFORE MEALS AS NEEDED FOR ABDOMINAL CRAMPS/LOOSE STOOLS. 90 capsule 3   docusate sodium (COLACE) 100 MG capsule TAKE 1 CAPSULE BY MOUTH TWICE DAILY (Patient not taking: Reported on 08/31/2021) 60 capsule 5   lamoTRIgine (LAMICTAL) 100 MG tablet Take 1 tablet (100 mg total) by mouth 2 (two) times daily. 60 tablet 5   loperamide (IMODIUM) 2 MG capsule Take 2 mg by mouth 4 (four) times daily as needed for diarrhea or loose stools.     ondansetron (ZOFRAN) 4 MG tablet Take 1 tablet (4 mg total) by mouth every 6 (six) hours as needed for nausea. 20 tablet 0   paliperidone (INVEGA) 9 MG 24 hr tablet Take 1 tablet (9 mg total) by mouth daily. 30 tablet 5   potassium chloride (KLOR-CON) 10 MEQ tablet TAKE 2 TABLETS (20MEQ) BY MOUTH TWICE DAILY WITH FOOD 120 tablet 0   propranolol (INDERAL) 10 MG tablet TAKE 1 TABLET BY MOUTH THREE TIMES A DAY. 90 tablet 0   RABEprazole (ACIPHEX) 20 MG tablet TAKE 1 TABLET BY MOUTH TWICE A DAY. 60 tablet 2   senna (SENNA-TABS) 8.6 MG tablet TAKE 1 TABLET BY MOUTH 2 TIMES A DAY 60 tablet 5   sertraline (ZOLOFT) 100 MG tablet Take 1 tablet (100 mg total) by mouth daily. 30 tablet 5   torsemide (DEMADEX) 20 MG tablet TAKE (1) TABLET BY MOUTH EACH MORNING. 30 tablet 0   traZODone (DESYREL) 100 MG tablet TAKE 2 TABLETS($RemoveBefore'200MG'iTQcXHGAnqZGi$ )  BY MOUTH AT BEDTIME. 60 tablet 5   No current facility-administered medications for this visit.     Musculoskeletal: Strength & Muscle Tone: {desc; muscle  tone:32375} Gait & Station: {PE GAIT ED NATL:22525} Patient leans: {Patient Leans:21022755}  Psychiatric Specialty Exam: Review of Systems  Blood pressure 132/84, pulse 84, temperature (!) 97.2 F (36.2 C), temperature source Temporal, height 5' 10.5" (1.791 m), weight 236 lb (107 kg), SpO2 96 %.Body mass index is 33.38 kg/m.  General Appearance: {Appearance:22683}  Eye Contact:  {BHH EYE CONTACT:22684}  Speech:  {Speech:22685}  Volume:  {Volume (PAA):22686}  Mood:  {BHH MOOD:22306}  Affect:  {Affect (PAA):22687}  Thought Process:  {Thought Process (PAA):22688}  Orientation:  {BHH ORIENTATION (PAA):22689}  Thought Content: {Thought Content:22690}   Suicidal Thoughts:  {ST/HT (PAA):22692}  Homicidal Thoughts:  {ST/HT (PAA):22692}  Memory:  {BHH MEMORY:22881}  Judgement:  {Judgement (PAA):22694}  Insight:  {Insight (PAA):22695}  Psychomotor Activity:  {Psychomotor (PAA):22696}  Concentration:  {Concentration:21399}  Recall:  {BHH GOOD/FAIR/POOR:22877}  Fund of Knowledge: {BHH GOOD/FAIR/POOR:22877}  Language: {BHH GOOD/FAIR/POOR:22877}  Akathisia:  {BHH YES OR NO:22294}  Handed:  {Handed:22697}  AIMS (if indicated): {Desc; done/not:10129}  Assets:  {Assets (PAA):22698}  ADL's:  {BHH YOV'Z:85885}  Cognition: {chl bhh cognition:304700322}  Sleep:  {BHH GOOD/FAIR/POOR:22877}   Screenings: GAD-7    Flowsheet Row Office Visit from 03/03/2021 in [place] Visit from 05/31/2020 in [place] Visit from 04/19/2020 in [place]  Total GAD-7 Score $RemoveBef'21 15 12      'iicobiRtME$ PHQ2-9    [place] Visit from 08/31/2021 in [place] from 04/26/2021 in [place] Office Visit from 03/03/2021 in [place] Video Visit from 12/02/2020 in [place] Office Visit from 05/31/2020 in [place]  PHQ-2 Total Score $RemoveBef'4 1 4 1 1  'hLqTBXzvKH$ PHQ-9 Total Score $RemoveBef'13 1 15 'RiqTsTItJm$ -- [place] Office Visit from 08/31/2021 in [facility] Admission (Discharged) from 06/14/2021 in [place] Video Visit from 12/02/2020 in [place] No Risk No Risk No Risk        Assessment and Plan: ***  Collaboration of Care: Collaboration of Care: {BH OP Collaboration of Care:21014065}  Patient/Guardian was advised Release of Information must be obtained prior to any record release in order to collaborate their care with an outside provider. Patient/Guardian was advised if they have not already done so to contact the registration department to sign all necessary forms in order for [place] to release information regarding their care.   Consent: Patient/Guardian gives verbal consent for treatment and assignment of benefits for services provided during this visit. Patient/Guardian expressed understanding and agreed to proceed.    Levonne Spiller, MD 08/31/2021, 10:28 AM

## 2021-08-31 NOTE — Progress Notes (Signed)
BH MD/PA/NP OP Progress Note  08/31/2021 10:28 AM [name]  MRN:  [id number]  Chief Complaint:  Chief Complaint  Patient presents with   Depression   Anxiety   Schizophrenia   Follow-up   HPI: This patient is a 61 year old divorced white female who lives alone in [place].  She is on disability.  The patient returns for follow-up after about 9 months in person.  She is here with her social worker Engineer, civil (consulting).  She is following up for diagnosis of schizoaffective disorder.  Overall the patient has been doing okay.  She recently underwent radiation treatment for neuroendocrine tumor in her lung.  She is also had knee surgery.  Her mobility is more impaired now that it has been in the past and she is walking with a walker.  She also has a home health aide coming in several days a week.  Her main concern today is that someone in her apartment building keeps saying bad things about her to the landlord.  This preoccupied is her mind and sometimes keeps her up at night.  However she denies significant depression anxiety auditory visual hallucinations or paranoia.  Her relatedness is good and she is answering questions appropriately.  She is drinking a lot of caffeinated sugary sodas up to 9 a day and I told her this needs to stop.  She still is smoking a pack a day and this also needs to be curtailed.  She claims that she is going to work on these things Visit Diagnosis:    ICD-10-CM   1. Schizoaffective disorder, depressive type ([place])  F25.1       Past Psychiatric History: Hospitalization in her younger years most recently outpatient treatment  Past Medical History:  Past Medical History:  Diagnosis Date   Anxiety    Aortic atherosclerosis ([place]) 09/19/2020   Seen on CAT scan 2021   Arthritis    Depression    Diabetes mellitus    Diabetes mellitus, type II ([place])    Dyspnea    Dysrhythmia    GERD (gastroesophageal reflux disease)    Heart murmur    HTN (hypertension)     Hyperglycemia    IBS (irritable bowel syndrome)    Lung nodules    right, followed by PCP, PET 11/2011   Mental retardation    MI (myocardial infarction) ([place])    Migraines    Pneumonia    Sleep apnea    cpap    Stroke [facility])     Past Surgical History:  Procedure Laterality Date   ABDOMINAL HYSTERECTOMY     BIOPSY  01/15/2020   Procedure: BIOPSY;  Surgeon: Daneil Dolin, MD;  [facility];  Service: Endoscopy;;  gastric   BRONCHIAL BIOPSY  06/14/2021   Procedure: BRONCHIAL BIOPSIES;  Surgeon: Garner Nash, DO;  Location: [facility];  Service: Pulmonary;;   BRONCHIAL BRUSHINGS  06/14/2021   Procedure: BRONCHIAL BRUSHINGS;  Surgeon: Garner Nash, DO;  Location: [facility];  Service: Pulmonary;;   BRONCHIAL NEEDLE ASPIRATION BIOPSY  06/14/2021   Procedure: BRONCHIAL NEEDLE ASPIRATION BIOPSIES;  Surgeon: Garner Nash, DO;  Location: [facility];  Service: Pulmonary;;   COLONOSCOPY  11/2007   hyperplastic polyps, prior hx of adenomas    COLONOSCOPY  05/2010   incomplete due to poor prep, hyperplastic rectal polyp   COLONOSCOPY  05/05/2002   Dimunitive polyps in the rectum and left colon, cold    biopsied/removed.  Scattered few left-sided diverticula.  Regular  colonic   mucosa appeared normal   COLONOSCOPY N/A 05/28/2013   Rourk: mulitple tubular adenomas removed. next tcs 05/2016   COLONOSCOPY WITH PROPOFOL N/A 09/07/2016   Dr. Gala Romney: For hyperplastic polyps removed. Next colonoscopy March 2023 given history of adenomatous colon polyps in the past.   ESOPHAGOGASTRODUODENOSCOPY  08/2007   moderate sized hiatal hernia   ESOPHAGOGASTRODUODENOSCOPY  05/2010   noncritical schatzki ring s/p 92F   ESOPHAGOGASTRODUODENOSCOPY (EGD) WITH ESOPHAGEAL DILATION N/A 02/06/2013   QJJ:HERDEY esophagus-s/p dilation up to a 11 [place] size with [facility] dilators.  Hiatal hernia   ESOPHAGOGASTRODUODENOSCOPY (EGD) WITH PROPOFOL N/A 09/07/2016   Dr. Gala Romney: Normal, status post empiric  dilation of the esophagus for history of dysphagia   ESOPHAGOGASTRODUODENOSCOPY (EGD) WITH PROPOFOL N/A 05/02/2019   Normal esophagus s/p dilation, normal stomach, normal duodenum   ESOPHAGOGASTRODUODENOSCOPY (EGD) WITH PROPOFOL N/A 01/15/2020   normal esophagus s/p dilation, gastric nodule s/p biopsy. This showed reactive gastropathy with H.pylori.    EXTERNAL EAR SURGERY     bilateral   FIDUCIAL MARKER PLACEMENT  06/14/2021   Procedure: FIDUCIAL MARKER PLACEMENT;  Surgeon: Garner Nash, DO;  Location: [facility];  Service: Pulmonary;;   FOOT SURGERY     GLAUCOMA SURGERY     KNEE ARTHROPLASTY Right 11/04/2020   Procedure: COMPUTER ASSISTED TOTAL KNEE ARTHROPLASTY;  Surgeon: Rod Can, MD;  Location: [facility];  Service: Orthopedics;  Laterality: Right;   MALONEY DILATION N/A 09/07/2016   Procedure: Venia Minks DILATION;  Surgeon: Daneil Dolin, MD;  [facility];  Service: Endoscopy;  Laterality: N/A;   MALONEY DILATION N/A 05/02/2019   Procedure: Venia Minks DILATION;  Surgeon: Daneil Dolin, MD;  [facility];  Service: Endoscopy;  Laterality: N/A;   MALONEY DILATION N/A 01/15/2020   Procedure: Venia Minks DILATION;  Surgeon: Daneil Dolin, MD;  [facility];  Service: Endoscopy;  Laterality: N/A;   POLYPECTOMY  09/07/2016   Procedure: POLYPECTOMY;  Surgeon: Daneil Dolin, MD;  [facility];  Service: Endoscopy;;  sigmoid colon x4   small bowel capsule  10/2007   normal   VIDEO BRONCHOSCOPY WITH RADIAL ENDOBRONCHIAL ULTRASOUND  06/14/2021   Procedure: VIDEO BRONCHOSCOPY WITH RADIAL ENDOBRONCHIAL ULTRASOUND;  Surgeon: Garner Nash, DO;  [facility];  Service: Pulmonary;;    Family Psychiatric History: See below  Family History:  Family History  Problem Relation Age of Onset   Stroke Mother    Heart attack Father    Schizophrenia Other    Drug abuse Other    Alcohol abuse Other    Colon cancer Other        aunt   Obesity Other     COPD Other    Anxiety disorder Other    GER disease Other    Diabetes type II Other    Anxiety disorder Other    Depression Other    Depression Sister    Schizophrenia Sister    Liver disease Neg Hx    Inflammatory bowel disease Neg Hx     Social History:  Social History   Socioeconomic History   Marital status: Divorced    Spouse name: Not on file   Number of children: Not on file   Years of education: Not on file   Highest education level: High school graduate  Occupational History   Occupation: disabled    Employer: UNEMPLOYED  Tobacco Use   Smoking status: Every Day    Packs/day: 0.50  Years: 40.00    Pack years: 20.00    Types: Cigarettes   Smokeless tobacco: Never  Vaping Use   Vaping Use: Never used  Substance and Sexual Activity   Alcohol use: No   Drug use: No   Sexual activity: Not Currently    Partners: Male    Birth control/protection: Surgical    Comment: hyst  Other Topics Concern   Not on file  Social History Narrative   Not on file   Social Determinants of Health   Financial Resource Strain: Low Risk    Difficulty of Paying Living Expenses: Not hard at all  Food Insecurity: No Food Insecurity   Worried About Charity fundraiser in the Last Year: Never true   Ran Out of Food in the Last Year: Never true  Transportation Needs: No Transportation Needs   Lack of Transportation (Medical): No   Lack of Transportation (Non-Medical): No  Physical Activity: Sufficiently Active   Days of Exercise per Week: 5 days   Minutes of Exercise per Session: 40 min  Stress: No Stress Concern Present   Feeling of Stress : Only a little  Social Connections: Not on file    Allergies:  Allergies  Allergen Reactions   Thorazine [Chlorpromazine Hcl] Anaphylaxis   Acetaminophen Other (See Comments)    Makes pt dizzy   Aspirin Other (See Comments)    seizure   Aspirin-Acetaminophen-Caffeine Other (See Comments)    seizure   Nsaids Nausea And Vomiting    Other     Acidic foods   Penicillins Nausea And Vomiting    Has patient had a PCN reaction causing immediate rash, facial/tongue/throat swelling, SOB or lightheadedness with hypotension:Yes Has patient had a PCN reaction causing severe rash involving mucus membranes or skin necrosis:Yes Has patient had a PCN reaction that required hospitalization:Yes Has patient had a PCN reaction occurring within the last 10 years:No If all of the above answers are "NO", then may proceed with Cephalosporin use.    Tomato Rash    Metabolic Disorder Labs: Lab Results  Component Value Date   HGBA1C 6.1 (H) 06/09/2021   MPG 126 10/25/2020   No results found for: PROLACTIN Lab Results  Component Value Date   CHOL 134 03/03/2021   TRIG 58 03/03/2021   HDL 74 03/03/2021   CHOLHDL 1.8 03/03/2021   VLDL 21 07/20/2014   LDLCALC 48 03/03/2021   LDLCALC 42 07/16/2020   Lab Results  Component Value Date   TSH 4.492 03/22/2019   TSH 3.180 02/28/2018    Therapeutic Level Labs: No results found for: LITHIUM No results found for: VALPROATE No components found for:  CBMZ  Current Medications: Current Outpatient Medications  Medication Sig Dispense Refill   atorvastatin (LIPITOR) 10 MG tablet TAKE 1 TABLET BY MOUTH ONCE A DAY. 30 tablet 1   benztropine (COGENTIN) 1 MG tablet Take 1 tablet (1 mg total) by mouth daily. 30 tablet 5   blood glucose meter kit and supplies Dispense based on patient and insurance preference. Use to test glucose once daily. (FOR ICD10, E11.9). (Patient not taking: Reported on 08/31/2021) 1 each 0   cycloSPORINE (RESTASIS) 0.05 % ophthalmic emulsion Place 1 drop into both eyes 2 (two) times daily.     dicyclomine (BENTYL) 10 MG capsule TAKE 1 CAPSULE BY MOUTH UP TO 3 TIMES DAILY BEFORE MEALS AS NEEDED FOR ABDOMINAL CRAMPS/LOOSE STOOLS. 90 capsule 3   docusate sodium (COLACE) 100 MG capsule TAKE 1 CAPSULE BY MOUTH TWICE  DAILY (Patient not taking: Reported on 08/31/2021) 60  capsule 5   lamoTRIgine (LAMICTAL) 100 MG tablet Take 1 tablet (100 mg total) by mouth 2 (two) times daily. 60 tablet 5   loperamide (IMODIUM) 2 MG capsule Take 2 mg by mouth 4 (four) times daily as needed for diarrhea or loose stools.     ondansetron (ZOFRAN) 4 MG tablet Take 1 tablet (4 mg total) by mouth every 6 (six) hours as needed for nausea. 20 tablet 0   paliperidone (INVEGA) 9 MG 24 hr tablet Take 1 tablet (9 mg total) by mouth daily. 30 tablet 5   potassium chloride (KLOR-CON) 10 MEQ tablet TAKE 2 TABLETS (20MEQ) BY MOUTH TWICE DAILY WITH FOOD 120 tablet 0   propranolol (INDERAL) 10 MG tablet TAKE 1 TABLET BY MOUTH THREE TIMES A DAY. 90 tablet 0   RABEprazole (ACIPHEX) 20 MG tablet TAKE 1 TABLET BY MOUTH TWICE A DAY. 60 tablet 2   senna (SENNA-TABS) 8.6 MG tablet TAKE 1 TABLET BY MOUTH 2 TIMES A DAY 60 tablet 5   sertraline (ZOLOFT) 100 MG tablet Take 1 tablet (100 mg total) by mouth daily. 30 tablet 5   torsemide (DEMADEX) 20 MG tablet TAKE (1) TABLET BY MOUTH EACH MORNING. 30 tablet 0   traZODone (DESYREL) 100 MG tablet TAKE 2 TABLETS(200MG) BY MOUTH AT BEDTIME. 60 tablet 5   No current facility-administered medications for this visit.     Musculoskeletal: Strength & Muscle Tone: decreased Gait & Station: unsteady Patient leans: N/A  Psychiatric Specialty Exam: Review of Systems  Constitutional:  Positive for fatigue.  Musculoskeletal:  Positive for arthralgias and gait problem.  All other systems reviewed and are negative.  Blood pressure 132/84, pulse 84, temperature (!) 97.2 F (36.2 C), temperature source Temporal, height 5' 10.5" (1.791 m), weight 236 lb (107 kg), SpO2 96 %.Body mass index is 33.38 kg/m.  General Appearance: Casual and Fairly Groomed  Eye Contact:  Fair  Speech:  Clear and Coherent  Volume:  Normal  Mood:  Euthymic  Affect:  Appropriate and Congruent  Thought Process:  Goal Directed  Orientation:  Full (Time, Place, and Person)  Thought Content:  Rumination   Suicidal Thoughts:  No  Homicidal Thoughts:  No  Memory:  Immediate;   Good Recent;   Fair Remote;   Poor  Judgement:  Impaired  Insight:  Shallow  Psychomotor Activity:  Decreased  Concentration:  Concentration: Fair and Attention Span: Fair  Recall:  [employer] of Knowledge: Fair  Language: Good  Akathisia:  No  Handed:  Right  AIMS (if indicated): not done  Assets:  Communication Skills Desire for Improvement Resilience Social Support  ADL's:  Intact  Cognition: Impaired,  Mild  Sleep:  Fair   Screenings: GAD-7    Flowsheet Row Office Visit from 03/03/2021 in [facility] Office Visit from 05/31/2020 in [facility] Office Visit from 04/19/2020 in [facility]  Total GAD-7 Score _0 PHQ2-9    [place] Visit from 08/31/2021 in [facility] from 04/26/2021 in [facility] Visit from 03/03/2021 in [facility] Video Visit from 12/02/2020 in [facility] Office Visit from 05/31/2020 in [facility]  PHQ-2 Total Score _1 PHQ-9 Total Score _2 -- [place] Office Visit from 08/31/2021 in [facility] [place]  [place] Admission (Discharged) from 06/14/2021 in [facility] [place] Video Visit from 12/02/2020 in [place] No Risk No Risk No Risk        Assessment and Plan: This patient is a 61 year old female with a history of schizo affective disorder and mild cognitive impairment.  She has had more health problems including a neuroendocrine tumor requiring radiation as well as recent knee surgery.  Her mobility is more limited now.  Overall however her mood has been stable.  She will continue Zoloft 100 mg daily for  depression, Invega 9 mg daily for schizophrenia symptoms, Lamictal 100 mg daily for mood stabilization, Cogentin 1 mg daily to prevent side effects from Invega, trazodone 200 mg at bedtime for sleep.  She will return to see me in 6 months.  Collaboration of Care: Collaboration of Care: Community Stakeholder(s) AEB DSS social worker is here with patient and  knowledgeable about all of her activities and medications.  Patient/Guardian was advised Release of Information must be obtained prior to any record release in order to collaborate their care with an outside provider. Patient/Guardian was advised if they have not already done so to contact the registration department to sign all necessary forms in order for [place] to release information regarding their care.   Consent: Patient/Guardian gives verbal consent for treatment and assignment of benefits for services provided during this visit. Patient/Guardian expressed understanding and agreed to proceed.    Levonne Spiller, MD 08/31/2021, 10:28 AM

## 2021-09-08 ENCOUNTER — Ambulatory Visit (INDEPENDENT_AMBULATORY_CARE_PROVIDER_SITE_OTHER): Payer: Medicare Other | Admitting: Family Medicine

## 2021-09-08 ENCOUNTER — Other Ambulatory Visit: Payer: Self-pay

## 2021-09-08 VITALS — BP 129/73 | HR 87 | Temp 98.6°F | Ht 70.5 in | Wt 240.0 lb

## 2021-09-08 DIAGNOSIS — I10 Essential (primary) hypertension: Secondary | ICD-10-CM | POA: Diagnosis not present

## 2021-09-08 DIAGNOSIS — E1169 Type 2 diabetes mellitus with other specified complication: Secondary | ICD-10-CM

## 2021-09-08 DIAGNOSIS — I700 Atherosclerosis of aorta: Secondary | ICD-10-CM | POA: Diagnosis not present

## 2021-09-08 DIAGNOSIS — E785 Hyperlipidemia, unspecified: Secondary | ICD-10-CM

## 2021-09-08 DIAGNOSIS — C342 Malignant neoplasm of middle lobe, bronchus or lung: Secondary | ICD-10-CM | POA: Diagnosis not present

## 2021-09-08 MED ORDER — ATORVASTATIN CALCIUM 10 MG PO TABS: 10.0000 mg | ORAL_TABLET | Freq: Every day | ORAL | 6 refills | Status: DC

## 2021-09-08 NOTE — Patient Instructions (Signed)
Stop torsemide ? ?Also reduce propranolol to 10 mg one twice daily ? ?Please do your labs ?Recheck here in 3 months ?

## 2021-09-08 NOTE — Progress Notes (Signed)
? ?  Subjective:  ? ? Patient ID: [name], female    DOB: [birth year], 61 y.o.   MRN: [id number] ? ?HPI ?Follow up DM, HTN ?ABD pain has gone to [facility] / needs to collect stool sample ?Chest pain x 3 days middle of chest ?Cough/ congestion x 1 week  ? ?Review of Systems ? ?   ?Objective:  ? Physical Exam ? ?General-in no acute distress ?Eyes-no discharge ?Lungs-respiratory rate normal, CTA ?CV-no murmurs,RRR ?Extremities skin warm dry no edema ?Neuro grossly normal ?Behavior normal, alert ? ? ? ?   ?Assessment & Plan:  ?1. Hyperlipidemia, unspecified hyperlipidemia type ?Continue cholesterol medicine.  Watch diet.  Check labs ?- atorvastatin (LIPITOR) 10 MG tablet; Take 1 tablet (10 mg total) by mouth daily.  Dispense: 30 tablet; Refill: 6 ? ?2. Essential hypertension ?Blood pressure good control propranolol reduced to twice daily keep heart rate under decent control ? ?3. Hyperlipidemia associated with type 2 diabetes mellitus ([place]) ?Continue cholesterol medicine patient to eliminate sugary drinks from her diet work hard on healthy eating ? ?4. Primary malignant neoplasm of right middle lobe of lung ([place]) ?Being treated with radiation treatments tolerating well ? ?5. Aortic atherosclerosis ([place]) ?Cholesterol medicine healthy diet keep blood pressure under control ? ?Follow-up 3 months ?We did discuss healthy eating we also discussed staying away from sugary drinks and also discuss quitting smoking it is unlikely she will quit smoking but she states she will try to cut back as for the sugary drinks she states she will be cutting back in her social worker stated that they will no longer be buying her sodas ? ?

## 2021-09-09 LAB — MICROALBUMIN / CREATININE URINE RATIO
Creatinine, Urine: 14.4 mg/dL
Microalb/Creat Ratio: 25 mg/g creat (ref 0–29)
Microalbumin, Urine: 3.6 ug/mL

## 2021-09-09 LAB — BASIC METABOLIC PANEL
BUN/Creatinine Ratio: 9 (ref 9–23)
BUN: 7 mg/dL (ref 6–24)
CO2: 31 mmol/L — ABNORMAL HIGH (ref 20–29)
Calcium: 9.7 mg/dL (ref 8.7–10.2)
Chloride: 96 mmol/L (ref 96–106)
Creatinine, Ser: 0.78 mg/dL (ref 0.57–1.00)
Glucose: 77 mg/dL (ref 70–99)
Potassium: 3.6 mmol/L (ref 3.5–5.2)
Sodium: 141 mmol/L (ref 134–144)
eGFR: 88 mL/min/{1.73_m2} (ref >59)

## 2021-09-09 LAB — HEPATIC FUNCTION PANEL
ALT: 13 IU/L (ref 0–32)
AST: 23 IU/L (ref 0–40)
Albumin: 4.9 g/dL (ref 3.8–4.9)
Alkaline Phosphatase: 113 IU/L (ref 44–121)
Bilirubin Total: 0.3 mg/dL (ref 0.0–1.2)
Bilirubin, Direct: 0.11 mg/dL (ref 0.00–0.40)
Total Protein: 7.9 g/dL (ref 6.0–8.5)

## 2021-09-09 LAB — LIPID PANEL
Chol/HDL Ratio: 1.8 ratio (ref 0.0–4.4)
Cholesterol, Total: 146 mg/dL (ref 100–199)
HDL: 80 mg/dL (ref >39)
LDL Chol Calc (NIH): 50 mg/dL (ref 0–99)
Triglycerides: 82 mg/dL (ref 0–149)
VLDL Cholesterol Cal: 16 mg/dL (ref 5–40)

## 2021-09-09 LAB — HEMOGLOBIN A1C
Est. average glucose Bld gHb Est-mCnc: 140 mg/dL
Hgb A1c MFr Bld: 6.5 % — ABNORMAL HIGH (ref 4.8–5.6)

## 2021-09-11 ENCOUNTER — Encounter: Payer: Self-pay | Admitting: Family Medicine

## 2021-09-11 NOTE — Progress Notes (Signed)
Please mail a copy to the patient as well as mailing a copy to her social worker thank you ?

## 2021-09-12 ENCOUNTER — Other Ambulatory Visit: Payer: Self-pay

## 2021-09-12 ENCOUNTER — Encounter: Payer: Self-pay | Admitting: Pulmonary Disease

## 2021-09-12 ENCOUNTER — Ambulatory Visit (INDEPENDENT_AMBULATORY_CARE_PROVIDER_SITE_OTHER): Payer: Medicare Other | Admitting: Pulmonary Disease

## 2021-09-12 VITALS — BP 120/70 | HR 85 | Temp 98.6°F | Ht 70.0 in | Wt 239.2 lb

## 2021-09-12 DIAGNOSIS — F17210 Nicotine dependence, cigarettes, uncomplicated: Secondary | ICD-10-CM | POA: Diagnosis not present

## 2021-09-12 DIAGNOSIS — Z716 Tobacco abuse counseling: Secondary | ICD-10-CM

## 2021-09-12 DIAGNOSIS — R911 Solitary pulmonary nodule: Secondary | ICD-10-CM | POA: Diagnosis not present

## 2021-09-12 DIAGNOSIS — D3A8 Other benign neuroendocrine tumors: Secondary | ICD-10-CM | POA: Diagnosis not present

## 2021-09-12 DIAGNOSIS — Z720 Tobacco use: Secondary | ICD-10-CM

## 2021-09-12 DIAGNOSIS — G4733 Obstructive sleep apnea (adult) (pediatric): Secondary | ICD-10-CM

## 2021-09-12 MED ORDER — TRELEGY ELLIPTA 100-62.5-25 MCG/ACT IN AEPB: 1.0000 | INHALATION_SPRAY | Freq: Every day | RESPIRATORY_TRACT | 0 refills | Status: DC

## 2021-09-12 NOTE — Progress Notes (Signed)
Smoking Cessation Counseling:  ? ?The patient?s current tobacco use: 1 pack per day  ?The patient was advised to quit and impact of smoking on their health.  ?I assessed the patient's willingness to attempt to quit. ?I provided methods and skills for cessation. ?We reviewed medication management of smoking session drugs if appropriate. ?Resources to help quit smoking were provided. ?A smoking cessation quit date was set: July 4th ?Follow-up was arranged in our clinic.  ?The amount of time spent counseling patient was 4 mins  ? ?Garner Nash, DO ? Pulmonary Critical Care ?09/12/2021 11:21 AM   ?

## 2021-09-12 NOTE — Progress Notes (Signed)
Synopsis: Referred in October 2022 for lung nodule by Kathyrn Drown, MD  Subjective:   PATIENT ID: [name] GENDER: female DOB: [birth year], MRN: [id number]  Chief Complaint  Patient presents with   Follow-up    Follow up. Patient says she's having some pain in her chest and wrist.     This is a 61 year old female, past medical history of diabetes, GERD, hypertension, hyperglycemia, MI, sleep apnea on CPAP.  Patient has been followed for a long time by Dr. Pia Mau thoracic surgery for evaluation of a right middle lobe lung nodule.  Lung nodule is macrolobulated and adjacent to the right heart border.  She had a recent nuclear medicine PET scan that showed PET avid uptake within the right heart border.  Unclear whether or not this is correlates with the nodule lesion on CT.  There could be some CT to PET scan body divergence on the overlay images.  There is also question whether or not this could represent a AVM.  OV 09/12/2021: Here today for follow-up.  Diagnosed with carcinoid tumor status post SBRT with radiation oncology.  From respiratory standpoint doing okay.  She still is a 50+ pack year history smoker.  Started smoking at age 26.  Has still smoking approximately 1 pack/day.  She lives alone.  Has had some recent falls at home.  Follows closely with primary care.  From a respiratory standpoint she feels short of breath at times.  She has morning cough and sputum production.  She had prior PFTs ordered but these have not been completed.    Past Medical History:  Diagnosis Date   Anxiety    Aortic atherosclerosis ([place]) 09/19/2020   Seen on CAT scan 2021   Arthritis    Depression    Diabetes mellitus    Diabetes mellitus, type II (HCC)    Dyspnea    Dysrhythmia    GERD (gastroesophageal reflux disease)    Heart murmur    HTN (hypertension)    Hyperglycemia    IBS (irritable bowel syndrome)    Lung nodules    right, followed by PCP, PET 11/2011   Mental retardation    MI  (myocardial infarction) ([place])    Migraines    Pneumonia    Sleep apnea    cpap    Stroke ([place])      Family History  Problem Relation Age of Onset   Stroke Mother    Heart attack Father    Schizophrenia Other    Drug abuse Other    Alcohol abuse Other    Colon cancer Other        aunt   Obesity Other    COPD Other    Anxiety disorder Other    GER disease Other    Diabetes type II Other    Anxiety disorder Other    Depression Other    Depression Sister    Schizophrenia Sister    Liver disease Neg Hx    Inflammatory bowel disease Neg Hx      Past Surgical History:  Procedure Laterality Date   ABDOMINAL HYSTERECTOMY     BIOPSY  01/15/2020   Procedure: BIOPSY;  Surgeon: Daneil Dolin, MD;  [facility];  Service: Endoscopy;;  gastric   BRONCHIAL BIOPSY  06/14/2021   Procedure: BRONCHIAL BIOPSIES;  Surgeon: Garner Nash, DO;  Location: [facility];  Service: Pulmonary;;   BRONCHIAL BRUSHINGS  06/14/2021   Procedure: BRONCHIAL BRUSHINGS;  Surgeon: June Leap  L, DO;  Location: [facility];  Service: Pulmonary;;   BRONCHIAL NEEDLE ASPIRATION BIOPSY  06/14/2021   Procedure: BRONCHIAL NEEDLE ASPIRATION BIOPSIES;  Surgeon: Garner Nash, DO;  Location: [facility];  Service: Pulmonary;;   COLONOSCOPY  11/2007   hyperplastic polyps, prior hx of adenomas    COLONOSCOPY  05/2010   incomplete due to poor prep, hyperplastic rectal polyp   COLONOSCOPY  05/05/2002   Dimunitive polyps in the rectum and left colon, cold    biopsied/removed.  Scattered few left-sided diverticula.  Regular colonic   mucosa appeared normal   COLONOSCOPY N/A 05/28/2013   Rourk: mulitple tubular adenomas removed. next tcs 05/2016   COLONOSCOPY WITH PROPOFOL N/A 09/07/2016   Dr. Gala Romney: For hyperplastic polyps removed. Next colonoscopy March 2023 given history of adenomatous colon polyps in the past.   ESOPHAGOGASTRODUODENOSCOPY  08/2007   moderate sized hiatal hernia    ESOPHAGOGASTRODUODENOSCOPY  05/2010   noncritical schatzki ring s/p 29F   ESOPHAGOGASTRODUODENOSCOPY (EGD) WITH ESOPHAGEAL DILATION N/A 02/06/2013   DXI:PJASNK esophagus-s/p dilation up to a 29 [place] size with [facility] dilators.  Hiatal hernia   ESOPHAGOGASTRODUODENOSCOPY (EGD) WITH PROPOFOL N/A 09/07/2016   Dr. Gala Romney: Normal, status post empiric dilation of the esophagus for history of dysphagia   ESOPHAGOGASTRODUODENOSCOPY (EGD) WITH PROPOFOL N/A 05/02/2019   Normal esophagus s/p dilation, normal stomach, normal duodenum   ESOPHAGOGASTRODUODENOSCOPY (EGD) WITH PROPOFOL N/A 01/15/2020   normal esophagus s/p dilation, gastric nodule s/p biopsy. This showed reactive gastropathy with H.pylori.    EXTERNAL EAR SURGERY     bilateral   FIDUCIAL MARKER PLACEMENT  06/14/2021   Procedure: FIDUCIAL MARKER PLACEMENT;  Surgeon: Garner Nash, DO;  Location: [facility];  Service: Pulmonary;;   FOOT SURGERY     GLAUCOMA SURGERY     KNEE ARTHROPLASTY Right 11/04/2020   Procedure: COMPUTER ASSISTED TOTAL KNEE ARTHROPLASTY;  Surgeon: Rod Can, MD;  Location: [facility];  Service: Orthopedics;  Laterality: Right;   MALONEY DILATION N/A 09/07/2016   Procedure: Venia Minks DILATION;  Surgeon: Daneil Dolin, MD;  [facility];  Service: Endoscopy;  Laterality: N/A;   MALONEY DILATION N/A 05/02/2019   Procedure: Venia Minks DILATION;  Surgeon: Daneil Dolin, MD;  [facility];  Service: Endoscopy;  Laterality: N/A;   MALONEY DILATION N/A 01/15/2020   Procedure: Venia Minks DILATION;  Surgeon: Daneil Dolin, MD;  [facility];  Service: Endoscopy;  Laterality: N/A;   POLYPECTOMY  09/07/2016   Procedure: POLYPECTOMY;  Surgeon: Daneil Dolin, MD;  [facility];  Service: Endoscopy;;  sigmoid colon x4   small bowel capsule  10/2007   normal   VIDEO BRONCHOSCOPY WITH RADIAL ENDOBRONCHIAL ULTRASOUND  06/14/2021   Procedure: VIDEO BRONCHOSCOPY WITH RADIAL ENDOBRONCHIAL ULTRASOUND;   Surgeon: Garner Nash, DO;  Location: [facility];  Service: Pulmonary;;    Social History   Socioeconomic History   Marital status: Divorced    Spouse name: Not on file   Number of children: Not on file   Years of education: Not on file   Highest education level: High school graduate  Occupational History   Occupation: disabled    Employer: UNEMPLOYED  Tobacco Use   Smoking status: Every Day    Packs/day: 0.50    Years: 40.00    Pack years: 20.00    Types: Cigarettes   Smokeless tobacco: Never  Vaping Use   Vaping Use: Never used  Substance and Sexual Activity   Alcohol  use: No   Drug use: No   Sexual activity: Not Currently    Partners: Male    Birth control/protection: Surgical    Comment: hyst  Other Topics Concern   Not on file  Social History Narrative   Not on file   Social Determinants of Health   Financial Resource Strain: Low Risk    Difficulty of Paying Living Expenses: Not hard at all  Food Insecurity: No Food Insecurity   Worried About Charity fundraiser in the Last Year: Never true   [place] in the Last Year: Never true  Transportation Needs: No Transportation Needs   Lack of Transportation (Medical): No   Lack of Transportation (Non-Medical): No  Physical Activity: Sufficiently Active   Days of Exercise per Week: 5 days   Minutes of Exercise per Session: 40 min  Stress: No Stress Concern Present   Feeling of Stress : Only a little  Social Connections: Not on file  Intimate Partner Violence: Not At Risk   Fear of Current or Ex-Partner: No   Emotionally Abused: No   Physically Abused: No   Sexually Abused: No     Allergies  Allergen Reactions   Thorazine [Chlorpromazine Hcl] Anaphylaxis   Acetaminophen Other (See Comments)    Makes pt dizzy   Aspirin Other (See Comments)    seizure   Aspirin-Acetaminophen-Caffeine Other (See Comments)    seizure   Nsaids Nausea And Vomiting   Other     Acidic foods   Penicillins Nausea  And Vomiting    Has patient had a PCN reaction causing immediate rash, facial/tongue/throat swelling, SOB or lightheadedness with hypotension:Yes Has patient had a PCN reaction causing severe rash involving mucus membranes or skin necrosis:Yes Has patient had a PCN reaction that required hospitalization:Yes Has patient had a PCN reaction occurring within the last 10 years:No If all of the above answers are "NO", then may proceed with Cephalosporin use.    Tomato Rash     Outpatient Medications Prior to Visit  Medication Sig Dispense Refill   atorvastatin (LIPITOR) 10 MG tablet Take 1 tablet (10 mg total) by mouth daily. 30 tablet 6   benztropine (COGENTIN) 1 MG tablet Take 1 tablet (1 mg total) by mouth daily. 30 tablet 5   cycloSPORINE (RESTASIS) 0.05 % ophthalmic emulsion Place 1 drop into both eyes 2 (two) times daily.     dicyclomine (BENTYL) 10 MG capsule TAKE 1 CAPSULE BY MOUTH UP TO 3 TIMES DAILY BEFORE MEALS AS NEEDED FOR ABDOMINAL CRAMPS/LOOSE STOOLS. 90 capsule 3   lamoTRIgine (LAMICTAL) 100 MG tablet Take 1 tablet (100 mg total) by mouth 2 (two) times daily. 60 tablet 5   loperamide (IMODIUM) 2 MG capsule Take 2 mg by mouth 4 (four) times daily as needed for diarrhea or loose stools.     ondansetron (ZOFRAN) 4 MG tablet Take 1 tablet (4 mg total) by mouth every 6 (six) hours as needed for nausea. 20 tablet 0   paliperidone (INVEGA) 9 MG 24 hr tablet Take 1 tablet (9 mg total) by mouth daily. 30 tablet 5   potassium chloride (KLOR-CON) 10 MEQ tablet TAKE 2 TABLETS (20MEQ) BY MOUTH TWICE DAILY WITH FOOD 120 tablet 0   propranolol (INDERAL) 10 MG tablet TAKE 1 TABLET BY MOUTH THREE TIMES A DAY. 90 tablet 0   RABEprazole (ACIPHEX) 20 MG tablet TAKE 1 TABLET BY MOUTH TWICE A DAY. 60 tablet 2   senna (SENNA-TABS) 8.6 MG tablet TAKE  1 TABLET BY MOUTH 2 TIMES A DAY 60 tablet 5   sertraline (ZOLOFT) 100 MG tablet Take 1 tablet (100 mg total) by mouth daily. 30 tablet 5   traZODone  (DESYREL) 100 MG tablet TAKE 2 TABLETS(200MG ) BY MOUTH AT BEDTIME. 60 tablet 5   No facility-administered medications prior to visit.    Review of Systems  Constitutional:  Negative for chills, fever, malaise/fatigue and weight loss.  HENT:  Negative for hearing loss, sore throat and tinnitus.   Eyes:  Negative for blurred vision and double vision.  Respiratory:  Positive for cough and shortness of breath. Negative for hemoptysis, sputum production, wheezing and stridor.   Cardiovascular:  Negative for chest pain, palpitations, orthopnea, leg swelling and PND.  Gastrointestinal:  Negative for abdominal pain, constipation, diarrhea, heartburn, nausea and vomiting.  Genitourinary:  Negative for dysuria, hematuria and urgency.  Musculoskeletal:  Negative for joint pain and myalgias.  Skin:  Negative for itching and rash.  Neurological:  Negative for dizziness, tingling, weakness and headaches.  Endo/Heme/Allergies:  Negative for environmental allergies. Does not bruise/bleed easily.  Psychiatric/Behavioral:  Negative for depression. The patient is not nervous/anxious and does not have insomnia.   All other systems reviewed and are negative.   Objective:  Physical Exam Vitals reviewed.  Constitutional:      General: She is not in acute distress.    Appearance: She is well-developed. She is obese.  HENT:     Head: Normocephalic and atraumatic.  Eyes:     General: No scleral icterus.    Conjunctiva/sclera: Conjunctivae normal.     Pupils: Pupils are equal, round, and reactive to light.  Neck:     Vascular: No JVD.     Trachea: No tracheal deviation.  Cardiovascular:     Rate and Rhythm: Normal rate and regular rhythm.     Heart sounds: Normal heart sounds. No murmur heard. Pulmonary:     Effort: Pulmonary effort is normal. No tachypnea, accessory muscle usage or respiratory distress.     Breath sounds: No stridor. No wheezing, rhonchi or rales.  Abdominal:     General: There is  no distension.     Palpations: Abdomen is soft.     Tenderness: There is no abdominal tenderness.  Musculoskeletal:        General: No tenderness.     Cervical back: Neck supple.  Lymphadenopathy:     Cervical: No cervical adenopathy.  Skin:    General: Skin is warm and dry.     Capillary Refill: Capillary refill takes less than 2 seconds.     Findings: No rash.  Neurological:     Mental Status: She is alert and oriented to person, place, and time.  Psychiatric:        Behavior: Behavior normal.     Vitals:   09/12/21 1103  BP: 120/70  Pulse: 85  Temp: 98.6 F (37 C)  TempSrc: Oral  SpO2: 96%  Weight: 239 lb 3.2 oz (108.5 kg)  Height: 5\' 10"  (1.778 m)   96% on RA BMI Readings from Last 3 Encounters:  09/12/21 34.32 kg/m  09/08/21 33.95 kg/m  06/30/21 33.04 kg/m   Wt Readings from Last 3 Encounters:  09/12/21 239 lb 3.2 oz (108.5 kg)  09/08/21 240 lb (108.9 kg)  06/30/21 233 lb 9.6 oz (106 kg)     CBC    Component Value Date/Time   WBC 5.5 06/15/2021 0226   RBC 3.97 06/15/2021 0226   HGB 12.1 06/15/2021  0226   HGB 12.8 03/03/2021 1101   HCT 37.1 06/15/2021 0226   HCT 37.9 03/03/2021 1101   PLT 152 06/15/2021 0226   PLT 184 03/03/2021 1101   MCV 93.5 06/15/2021 0226   MCV 84 03/03/2021 1101   MCH 30.5 06/15/2021 0226   MCHC 32.6 06/15/2021 0226   RDW 14.8 06/15/2021 0226   RDW 14.6 03/03/2021 1101   LYMPHSABS 1.6 03/03/2021 1101   MONOABS 0.8 03/22/2019 0130   EOSABS 0.0 03/03/2021 1101   BASOSABS 0.0 03/03/2021 1101     Chest Imaging: 03/04/2021 CT chest: Patient with right middle lobe pulmonary nodule slowly enlarging over time could represent indolent neoplasm it is macrolobulated associated preclosed to the vessels.  There was a concern whether or not this could be a AVM.  Does not appear that she has had a CTA.  04/01/2021: Nuclear medicine pet imaging with PET avid uptake on the right heart border adjacent to the same area as the nodule.   There could be some CT PET scan divergence on the overlay.  The hypermetabolic area could represent where the nodules located at does potentially appear this way. May be dealing with a indolent neoplasm. The patient's images have been independently reviewed by me.    Pulmonary Functions Testing Results: No flowsheet data found.  FeNO:   Pathology:   Echocardiogram:   Heart Catheterization:     Assessment & Plan:     ICD-10-CM   1. Neuroendocrine tumor  D3A.8     2. Pulmonary nodule, right  R91.1     3. Obstructive sleep apnea syndrome  G47.33     4. Tobacco use  Z72.0     5. Encounter for smoking cessation counseling  Z71.6       Discussion:  This is a 61 year old female, obese, history of OSA, longstanding history of tobacco use, 50+ years found to have a nodule in the right lung that was taken for navigational bronchoscopy and biopsy positive for carcinoid tumor.  Patient was referred for SBRT as she was felt not to be a good candidate for surgical resection by cardiothoracic surgery  Plan: She is a longstanding smoking history likely has a mixed pattern of potential COPD versus restrictive disease related to her morbid obesity. She needs to quit smoking, please see separate documentation regarding smoking cessation counseling. She would likely also benefit from a inhaler regimen. I will start her with Trelegy samples to see if this helps. I think if she is breathing better she would be more apt to considerations for quitting smoking.  She seems to be depressed when she is restricted from her respiratory standpoint not able to do the things she wants to do.  So she ends up sitting and smoking more.  She has follow-up already scheduled with radiation oncology for repeat CT imaging. Once she is done with her surveillance CT imaging she will need to be reenrolled in our lung cancer screening program. At minimum start of lung cancer screening program in April 2024. I will  place referral. Return to clinic to see [place] in July 2023 hopefully smoke-free. Smoking cessation date planned for July 4.   Current Outpatient Medications:    atorvastatin (LIPITOR) 10 MG tablet, Take 1 tablet (10 mg total) by mouth daily., Disp: 30 tablet, Rfl: 6   benztropine (COGENTIN) 1 MG tablet, Take 1 tablet (1 mg total) by mouth daily., Disp: 30 tablet, Rfl: 5   cycloSPORINE (RESTASIS) 0.05 % ophthalmic emulsion, Place 1 drop  into both eyes 2 (two) times daily., Disp: , Rfl:    dicyclomine (BENTYL) 10 MG capsule, TAKE 1 CAPSULE BY MOUTH UP TO 3 TIMES DAILY BEFORE MEALS AS NEEDED FOR ABDOMINAL CRAMPS/LOOSE STOOLS., Disp: 90 capsule, Rfl: 3   lamoTRIgine (LAMICTAL) 100 MG tablet, Take 1 tablet (100 mg total) by mouth 2 (two) times daily., Disp: 60 tablet, Rfl: 5   loperamide (IMODIUM) 2 MG capsule, Take 2 mg by mouth 4 (four) times daily as needed for diarrhea or loose stools., Disp: , Rfl:    ondansetron (ZOFRAN) 4 MG tablet, Take 1 tablet (4 mg total) by mouth every 6 (six) hours as needed for nausea., Disp: 20 tablet, Rfl: 0   paliperidone (INVEGA) 9 MG 24 hr tablet, Take 1 tablet (9 mg total) by mouth daily., Disp: 30 tablet, Rfl: 5   potassium chloride (KLOR-CON) 10 MEQ tablet, TAKE 2 TABLETS (20MEQ) BY MOUTH TWICE DAILY WITH FOOD, Disp: 120 tablet, Rfl: 0   propranolol (INDERAL) 10 MG tablet, TAKE 1 TABLET BY MOUTH THREE TIMES A DAY., Disp: 90 tablet, Rfl: 0   RABEprazole (ACIPHEX) 20 MG tablet, TAKE 1 TABLET BY MOUTH TWICE A DAY., Disp: 60 tablet, Rfl: 2   senna (SENNA-TABS) 8.6 MG tablet, TAKE 1 TABLET BY MOUTH 2 TIMES A DAY, Disp: 60 tablet, Rfl: 5   sertraline (ZOLOFT) 100 MG tablet, Take 1 tablet (100 mg total) by mouth daily., Disp: 30 tablet, Rfl: 5   traZODone (DESYREL) 100 MG tablet, TAKE 2 TABLETS(200MG ) BY MOUTH AT BEDTIME., Disp: 60 tablet, Rfl: 5    Garner Nash, DO [facility] 09/12/2021 11:27 AM

## 2021-09-12 NOTE — Patient Instructions (Signed)
Thank you for visiting Dr. Valeta Harms at [facility]. ?Today we recommend the following: ? ?Trelegy samples today and new prescription  ?Albuterol prescription to use as needed for shortness of breath  ? ?Return in about 4 months (around 01/12/2022) for with APP or Dr. Valeta Harms. ? ? ? ?Please do your part to reduce the spread of COVID-19.  ? ?You must quit smoking or vaping. This is the single most important thing that you can do to improve your lung health.  ? ?S = Set a quit date. ?T = Tell family, friends, and the people around you that you plan to quit. ?A = Anticipate or plan ahead for the tough times you'll face while quitting. ?R = Remove cigarettes and other tobacco products from your home, car, and work ?T = Talk to [place] about getting help to quit ? ?If you need help feel free to reach out to our office, [place] [facility]oking Cessation Class: [phone], call 1-800-QUIT-NOW, or visit [email or web address] ? ? ? ? ?

## 2021-09-12 NOTE — Addendum Note (Signed)
Addended by: Fran Lowes on: 09/12/2021 11:57 AM ? ? Modules accepted: Orders ? ?

## 2021-09-14 ENCOUNTER — Other Ambulatory Visit: Payer: Self-pay

## 2021-09-14 ENCOUNTER — Encounter (HOSPITAL_COMMUNITY): Payer: Self-pay

## 2021-09-14 ENCOUNTER — Emergency Department (HOSPITAL_COMMUNITY)
Admission: EM | Admit: 2021-09-14 | Discharge: 2021-09-14 | Disposition: A | Discharge status: Home / Self Care | Payer: Medicare Other | Attending: Emergency Medicine | Admitting: Emergency Medicine

## 2021-09-14 ENCOUNTER — Emergency Department (HOSPITAL_COMMUNITY): Payer: Medicare Other

## 2021-09-14 DIAGNOSIS — I10 Essential (primary) hypertension: Secondary | ICD-10-CM | POA: Insufficient documentation

## 2021-09-14 DIAGNOSIS — I2510 Atherosclerotic heart disease of native coronary artery without angina pectoris: Secondary | ICD-10-CM | POA: Insufficient documentation

## 2021-09-14 DIAGNOSIS — R531 Weakness: Secondary | ICD-10-CM | POA: Insufficient documentation

## 2021-09-14 DIAGNOSIS — R29818 Other symptoms and signs involving the nervous system: Secondary | ICD-10-CM | POA: Diagnosis not present

## 2021-09-14 DIAGNOSIS — E119 Type 2 diabetes mellitus without complications: Secondary | ICD-10-CM | POA: Diagnosis not present

## 2021-09-14 DIAGNOSIS — Z79899 Other long term (current) drug therapy: Secondary | ICD-10-CM | POA: Diagnosis not present

## 2021-09-14 DIAGNOSIS — G459 Transient cerebral ischemic attack, unspecified: Secondary | ICD-10-CM | POA: Diagnosis not present

## 2021-09-14 DIAGNOSIS — Z743 Need for continuous supervision: Secondary | ICD-10-CM | POA: Diagnosis not present

## 2021-09-14 LAB — COMPREHENSIVE METABOLIC PANEL
ALT: 14 U/L (ref 0–44)
AST: 22 U/L (ref 15–41)
Albumin: 4 g/dL (ref 3.5–5.0)
Alkaline Phosphatase: 84 U/L (ref 38–126)
Anion gap: 8 (ref 5–15)
BUN: 6 mg/dL (ref 6–20)
CO2: 33 mmol/L — ABNORMAL HIGH (ref 22–32)
Calcium: 8.7 mg/dL — ABNORMAL LOW (ref 8.9–10.3)
Chloride: 99 mmol/L (ref 98–111)
Creatinine, Ser: 0.81 mg/dL (ref 0.44–1.00)
GFR, Estimated: >60 mL/min (ref >60)
Glucose, Bld: 95 mg/dL (ref 70–99)
Potassium: 3.3 mmol/L — ABNORMAL LOW (ref 3.5–5.1)
Sodium: 140 mmol/L (ref 135–145)
Total Bilirubin: 0.4 mg/dL (ref 0.3–1.2)
Total Protein: 7.4 g/dL (ref 6.5–8.1)

## 2021-09-14 LAB — CBC WITH DIFFERENTIAL/PLATELET
Abs Immature Granulocytes: 0 10*3/uL (ref 0.00–0.07)
Basophils Absolute: 0 10*3/uL (ref 0.0–0.1)
Basophils Relative: 1 %
Eosinophils Absolute: 0 10*3/uL (ref 0.0–0.5)
Eosinophils Relative: 1 %
HCT: 39.8 % (ref 36.0–46.0)
Hemoglobin: 13.2 g/dL (ref 12.0–15.0)
Immature Granulocytes: 0 %
Lymphocytes Relative: 37 %
Lymphs Abs: 1.3 10*3/uL (ref 0.7–4.0)
MCH: 31.7 pg (ref 26.0–34.0)
MCHC: 33.2 g/dL (ref 30.0–36.0)
MCV: 95.7 fL (ref 80.0–100.0)
Monocytes Absolute: 0.6 10*3/uL (ref 0.1–1.0)
Monocytes Relative: 16 %
Neutro Abs: 1.6 10*3/uL — ABNORMAL LOW (ref 1.7–7.7)
Neutrophils Relative %: 45 %
Platelets: 149 10*3/uL — ABNORMAL LOW (ref 150–400)
RBC: 4.16 MIL/uL (ref 3.87–5.11)
RDW: 13.3 % (ref 11.5–15.5)
WBC: 3.5 10*3/uL — ABNORMAL LOW (ref 4.0–10.5)
nRBC: 0 % (ref 0.0–0.2)

## 2021-09-14 LAB — URINALYSIS, ROUTINE W REFLEX MICROSCOPIC
Bilirubin Urine: NEGATIVE
Glucose, UA: NEGATIVE mg/dL
Ketones, ur: NEGATIVE mg/dL
Leukocytes,Ua: NEGATIVE
Nitrite: NEGATIVE
Protein, ur: NEGATIVE mg/dL
Specific Gravity, Urine: <1.005 — ABNORMAL LOW (ref 1.005–1.030)
pH: 6 (ref 5.0–8.0)

## 2021-09-14 LAB — URINALYSIS, MICROSCOPIC (REFLEX)

## 2021-09-14 LAB — CBG MONITORING, ED: Glucose-Capillary: 90 mg/dL (ref 70–99)

## 2021-09-14 NOTE — ED Provider Notes (Signed)
[facility] EMERGENCY DEPARTMENT Provider Note   CSN: [id number] Arrival date & time: 09/14/21  1411     History  Chief Complaint  Patient presents with   Weakness    [name] is a 61 y.o. female.  Pt is a 60 yo female with a past medical hx significant for dm, gerd, htn, cad, sleep apnea on cpap, cva, neuroendocrine lung tumor, and schizoaffective d/o.  Pt called EMS because she thinks she is having a stroke.  She said she has felt that her lip is twisted and that her right arm is weak since yesterday.  She walks with a walker and that is not worse.  Vision and speech are the same.      Home Medications Prior to Admission medications   Medication Sig Start Date End Date Taking? Authorizing Provider  atorvastatin (LIPITOR) 10 MG tablet Take 1 tablet (10 mg total) by mouth daily. 09/08/21   Kathyrn Drown, MD  benztropine (COGENTIN) 1 MG tablet Take 1 tablet (1 mg total) by mouth daily. 08/31/21   Cloria Spring, MD  cycloSPORINE (RESTASIS) 0.05 % ophthalmic emulsion Place 1 drop into both eyes 2 (two) times daily.    [provider]  dicyclomine (BENTYL) 10 MG capsule TAKE 1 CAPSULE BY MOUTH UP TO 3 TIMES DAILY BEFORE MEALS AS NEEDED FOR ABDOMINAL CRAMPS/LOOSE STOOLS. 04/27/21   Annitta Needs, NP  Fluticasone-Umeclidin-Vilant (TRELEGY ELLIPTA) 100-62.5-25 MCG/ACT AEPB Inhale 1 puff into the lungs daily. 09/12/21   Icard, Octavio Graves, DO  Fluticasone-Umeclidin-Vilant (TRELEGY ELLIPTA) 100-62.5-25 MCG/ACT AEPB Inhale 1 puff into the lungs daily. 09/12/21   Icard, Octavio Graves, DO  lamoTRIgine (LAMICTAL) 100 MG tablet Take 1 tablet (100 mg total) by mouth 2 (two) times daily. 08/31/21   Cloria Spring, MD  loperamide (IMODIUM) 2 MG capsule Take 2 mg by mouth 4 (four) times daily as needed for diarrhea or loose stools. 02/04/21   [provider]  ondansetron (ZOFRAN) 4 MG tablet Take 1 tablet (4 mg total) by mouth every 6 (six) hours as needed for nausea. 11/09/20   Dorothyann Peng, PA-C  paliperidone (INVEGA) 9 MG 24 hr tablet Take 1 tablet (9 mg total) by mouth daily. 08/31/21   Cloria Spring, MD  potassium chloride (KLOR-CON) 10 MEQ tablet TAKE 2 TABLETS (20MEQ) BY MOUTH TWICE DAILY WITH FOOD 08/24/21   Kathyrn Drown, MD  propranolol (INDERAL) 10 MG tablet TAKE 1 TABLET BY MOUTH THREE TIMES A DAY. 08/24/21   Kathyrn Drown, MD  RABEprazole (ACIPHEX) 20 MG tablet TAKE 1 TABLET BY MOUTH TWICE A DAY. 07/30/21   Mahala Menghini, PA-C  senna (SENNA-TABS) 8.6 MG tablet TAKE 1 TABLET BY MOUTH 2 TIMES A DAY 03/24/21   Kathyrn Drown, MD  sertraline (ZOLOFT) 100 MG tablet Take 1 tablet (100 mg total) by mouth daily. 08/31/21   Cloria Spring, MD  traZODone (DESYREL) 100 MG tablet TAKE 2 TABLETS(200MG ) BY MOUTH AT BEDTIME. 08/31/21   Cloria Spring, MD      Allergies    Thorazine [chlorpromazine hcl], Acetaminophen, Aspirin, Aspirin-acetaminophen-caffeine, Nsaids, Other, Penicillins, and Tomato    Review of Systems   Review of Systems  Neurological:  Positive for weakness.  All other systems reviewed and are negative.  Physical Exam Updated Vital Signs BP 127/86    Pulse 73    Temp 98.3 F (36.8 C) (Oral)    Resp 15    Ht 5\' 10"  (  1.778 m)    Wt 108.5 kg    SpO2 95%    BMI 34.32 kg/m  Physical Exam Vitals and nursing note reviewed.  Constitutional:      Appearance: Normal appearance.  HENT:     Head: Normocephalic and atraumatic.     Right Ear: External ear normal.     Left Ear: External ear normal.     Nose: Nose normal.     Mouth/Throat:     Mouth: Mucous membranes are moist.     Pharynx: Oropharynx is clear.  Eyes:     Extraocular Movements: Extraocular movements intact.     Conjunctiva/sclera: Conjunctivae normal.     Pupils: Pupils are equal, round, and reactive to light.  Cardiovascular:     Rate and Rhythm: Normal rate and regular rhythm.     Pulses: Normal pulses.     Heart sounds: Normal heart sounds.  Pulmonary:     Effort: Pulmonary  effort is normal.     Breath sounds: Normal breath sounds.  Abdominal:     General: Abdomen is flat. Bowel sounds are normal.     Palpations: Abdomen is soft.  Musculoskeletal:        General: Normal range of motion.     Cervical back: Normal range of motion and neck supple.  Skin:    General: Skin is warm.     Capillary Refill: Capillary refill takes less than 2 seconds.  Neurological:     General: No focal deficit present.     Mental Status: She is alert and oriented to person, place, and time.  Psychiatric:        Mood and Affect: Mood normal.        Behavior: Behavior normal.    ED Results / Procedures / Treatments   Labs (all labs ordered are listed, but only abnormal results are displayed) Labs Reviewed  CBC WITH DIFFERENTIAL/PLATELET - Abnormal; Notable for the following components:      Result Value   WBC 3.5 (*)    Platelets 149 (*)    Neutro Abs 1.6 (*)    All other components within normal limits  COMPREHENSIVE METABOLIC PANEL - Abnormal; Notable for the following components:   Potassium 3.3 (*)    CO2 33 (*)    Calcium 8.7 (*)    All other components within normal limits  URINALYSIS, ROUTINE W REFLEX MICROSCOPIC  CBG MONITORING, ED    EKG None  Radiology CT Head Wo Contrast  Result Date: 09/14/2021 CLINICAL DATA:  Neuro deficit, acute, stroke suspected. Generalized weakness. EXAM: CT HEAD WITHOUT CONTRAST TECHNIQUE: Contiguous axial images were obtained from the base of the skull through the vertex without intravenous contrast. RADIATION DOSE REDUCTION: This exam was performed according to the departmental dose-optimization program which includes automated exposure control, adjustment of the mA and/or kV according to patient size and/or use of iterative reconstruction technique. COMPARISON:  Head CT 05/27/2020 FINDINGS: Brain: There is no evidence of an acute infarct, intracranial hemorrhage, mass, midline shift, or extra-axial fluid collection. The ventricles  and sulci are within normal limits for age. Vascular: Calcified atherosclerosis at the skull base. No hyperdense vessel. Skull: No fracture or suspicious osseous lesion. Sinuses/Orbits: Visualized paranasal sinuses and mastoid air cells are clear. Visualized orbits are unremarkable. Other: None. IMPRESSION: Unremarkable CT appearance of the brain. Electronically Signed   By: Logan Bores M.D.   On: 09/14/2021 14:49    Procedures Procedures    Medications Ordered in ED Medications -  No data to display  ED Course/ Medical Decision Making/ A&P                           Medical Decision Making Amount and/or Complexity of Data Reviewed Labs: ordered. Radiology: ordered.   This patient presents to the ED for concern of stroke, this involves an extensive number of treatment options, and is a complaint that carries with it a high risk of complications and morbidity.  The differential diagnosis includes cva, cardiac abn, pulm abn, electrolyte abn, infection   Co morbidities that complicate the patient evaluation  dm, gerd, htn, cad, sleep apnea on cpap, cva, neuroendocrine lung tumor, and schizoaffective d/o   Additional history obtained:  Additional history obtained from epic chart review External records from outside source obtained and reviewed including EMS report   Lab Tests:  I Ordered, and personally interpreted labs.  The pertinent results include:  cbc with wbc slightly low at 3.5 and plt slightly low at 149, cmp with K of 3.3.  urine pending.   Imaging Studies ordered:  I ordered imaging studies including ct head and MRI  I independently visualized and interpreted imaging which showed CT head:   IMPRESSION:  Unremarkable CT appearance of the brain.   I agree with the radiologist interpretation   Cardiac Monitoring:  The patient was maintained on a cardiac monitor.  I personally viewed and interpreted the cardiac monitored which showed an underlying rhythm of:  nsr   Medicines ordered and prescription drug management:  I have reviewed the patients home medicines and have made adjustments as needed   Test Considered:  Mir brain:  Will order due to hx cva and multiple risk factors   Critical Interventions:  mri   Problem List / ED Course:  Right arm weakness:  no cva on ct had.  MRI ordered.  Pt signed out to Dr. Rogene Houston at shift change         Final Clinical Impression(s) / ED Diagnoses Final diagnoses:  Weakness    Rx / DC Orders ED Discharge Orders     None         Isla Pence, MD 09/14/21 1521

## 2021-09-14 NOTE — ED Triage Notes (Signed)
Pt presents to ED with complaints of her "lip twisted and thinks she is having a stroke." Stroke screen negative by EMS. Pt c/o pain on the right side 10/10 describes as a stabbing.  ?

## 2021-09-14 NOTE — ED Notes (Signed)
Patient transported to CT 

## 2021-09-14 NOTE — ED Provider Notes (Signed)
Patient brought in by EMS for concern for stroke.  MRI negative for any evidence of stroke.  Urinalysis is normal no signs of urinary tract infection.  Other labs were back prior to patient being turned over to me which include CBC and comprehensive metabolic panel.  Slight hypokalemia at 3.3 but not significant. ? ?Patient was dischargeable if MRI and urinalysis was negative.  Patient is doing fine and she is okay with that.  She has primary care doctor to follow-up with. ?  ?Fredia Sorrow, MD ?09/14/21 1747 ? ?

## 2021-09-14 NOTE — Discharge Instructions (Addendum)
Urinalysis MRI negative.  Follow-up with your doctor.  Return for any new or worse symptoms. ?

## 2021-09-15 ENCOUNTER — Encounter: Payer: Self-pay | Admitting: *Deleted

## 2021-09-20 ENCOUNTER — Other Ambulatory Visit: Payer: Self-pay | Admitting: Family Medicine

## 2021-09-20 ENCOUNTER — Other Ambulatory Visit: Payer: Self-pay

## 2021-09-20 ENCOUNTER — Ambulatory Visit (INDEPENDENT_AMBULATORY_CARE_PROVIDER_SITE_OTHER): Payer: Medicare Other

## 2021-09-20 ENCOUNTER — Ambulatory Visit (INDEPENDENT_AMBULATORY_CARE_PROVIDER_SITE_OTHER): Payer: Medicare Other | Admitting: Podiatry

## 2021-09-20 ENCOUNTER — Encounter: Payer: Self-pay | Admitting: Podiatry

## 2021-09-20 DIAGNOSIS — M2141 Flat foot [pes planus] (acquired), right foot: Secondary | ICD-10-CM | POA: Diagnosis not present

## 2021-09-20 DIAGNOSIS — M2142 Flat foot [pes planus] (acquired), left foot: Secondary | ICD-10-CM | POA: Diagnosis not present

## 2021-09-20 DIAGNOSIS — M79676 Pain in unspecified toe(s): Secondary | ICD-10-CM | POA: Diagnosis not present

## 2021-09-20 DIAGNOSIS — B351 Tinea unguium: Secondary | ICD-10-CM | POA: Diagnosis not present

## 2021-09-20 DIAGNOSIS — M2011 Hallux valgus (acquired), right foot: Secondary | ICD-10-CM | POA: Diagnosis not present

## 2021-09-20 DIAGNOSIS — E1142 Type 2 diabetes mellitus with diabetic polyneuropathy: Secondary | ICD-10-CM

## 2021-09-20 DIAGNOSIS — M2012 Hallux valgus (acquired), left foot: Secondary | ICD-10-CM

## 2021-09-20 DIAGNOSIS — M2041 Other hammer toe(s) (acquired), right foot: Secondary | ICD-10-CM

## 2021-09-20 NOTE — Progress Notes (Signed)
SITUATION ?Reason for Visit: Fitting of Diabetic [place] ?Patient / Caregiver Report:  Patient is satisfied with fit and function of shoes and insoles. ? ?OBJECTIVE DATA: ?Patient History / Diagnosis:   ?  ICD-10-CM   ?1. Diabetic polyneuropathy associated with type 2 diabetes mellitus ([place])  E11.42   ?  ?2. Hammer toe of right foot  M20.41   ?  ?3. Hallux valgus, acquired, bilateral  M20.11   ? M20.12   ?  ?4. Pes planus of both feet  M21.41   ? M21.42   ?  ? ? ?Change in Status:   None ? ?ACTIONS PERFORMED: ?In-Person Delivery, patient was fit with: ?- 1x pair [id number] PDAC approved prefabricated Diabetic Shoes: Apex double strap velcro shoes ?- 3x pair [id number] PDAC approved vacuum formed custom diabetic insoles; [facility]: [id number] ? ?Shoes and insoles were verified for structural integrity and safety. Patient wore shoes and insoles in office. Skin was inspected and free of areas of concern after wearing shoes and inserts. Shoes and inserts fit properly. Patient / Caregiver provided with ferbal instruction and demonstration regarding donning, doffing, wear, care, proper fit, function, purpose, cleaning, and use of shoes and insoles ' and in all related precautions and risks and benefits regarding shoes and insoles. Patient / Caregiver was instructed to wear properly fitting socks with shoes at all times. Patient was also provided with verbal instruction regarding how to report any failures or malfunctions of shoes or inserts, and necessary follow up care. Patient / Caregiver was also instructed to contact physician regarding change in status that may affect function of shoes and inserts.  ? ?Patient / Caregiver verbalized undersatnding of instruction provided. Patient / Caregiver demonstrated independence with proper donning and doffing of shoes and inserts. ? ?PLAN ?Patient to follow up as needed. Plan of care was discussed with and agreed upon by patient and/or caregiver. All questions were answered and  concerns addressed. ? ? ?

## 2021-09-21 ENCOUNTER — Other Ambulatory Visit: Payer: Self-pay | Admitting: Gastroenterology

## 2021-09-21 ENCOUNTER — Other Ambulatory Visit (HOSPITAL_COMMUNITY): Payer: Self-pay | Admitting: Psychiatry

## 2021-09-21 NOTE — Telephone Encounter (Signed)
Last office visit 04/08/21 ?

## 2021-09-22 ENCOUNTER — Other Ambulatory Visit: Payer: Self-pay

## 2021-09-22 MED ORDER — DICYCLOMINE HCL 10 MG PO CAPS
10.0000 mg | ORAL_CAPSULE | Freq: Three times a day (TID) | ORAL | 0 refills | Status: DC
Start: 2021-09-22 — End: 2021-11-10

## 2021-09-26 ENCOUNTER — Other Ambulatory Visit: Payer: Self-pay

## 2021-09-26 ENCOUNTER — Ambulatory Visit (INDEPENDENT_AMBULATORY_CARE_PROVIDER_SITE_OTHER): Payer: Medicare Other | Admitting: Family Medicine

## 2021-09-26 VITALS — BP 118/70 | HR 77 | Temp 97.9°F | Ht 70.0 in | Wt 242.0 lb

## 2021-09-26 DIAGNOSIS — G459 Transient cerebral ischemic attack, unspecified: Secondary | ICD-10-CM

## 2021-09-26 NOTE — Progress Notes (Signed)
? ?  Subjective:  ? ? Patient ID: [name], female    DOB: [birth year], 61 y.o.   MRN: [id number] ? ?HPI ?Recent hospital visit for acute stroke  ?Hemerrhoids problem, constipation , some rectal bleeding  ?Upcoming colonoscopy  ?Patient comes in today because she feels she had 2 strokes she ended up going to the ER she had an MRI which was negative for stroke she is able to walk with a walker nothing is changed in that regards her overall strength is doing okay within reason.  She still smokes she has been encouraged to quit smoking recent lab work shows good control of her cholesterol A1c and her blood pressure today under good control ? ?Review of Systems ? ?   ?Objective:  ? Physical Exam ?Lungs clear heart regular HEENT benign ?Walks with a walker but no acute unifocal signs currently ? ? ?   ?Assessment & Plan:  ?Patient may have had a TIA ?It is also possible it just could  been stress ?Related issue. ?Nonetheless very important to keep cholesterol under good control blood pressure and A1c under good control ?Also very important to do carotid Doppler studies patient needs a follow-up within 6 to 8 weeks follow-up sooner problems ?If stroke symptoms do occur go back to ER ?

## 2021-09-26 NOTE — Progress Notes (Signed)
?  Subjective:  ?Patient ID: [name], female    DOB: 1960/09/23,  MRN: [id number] ? ?[name] presents to clinic today for at risk foot care with history of diabetic neuropathy and painful elongated mycotic toenails 1-5 bilaterally which are tender when wearing enclosed shoe gear. Pain is relieved with periodic professional debridement. ? ?Last HgA1c was 6.5%.Patient did not check  blood glucose today. ? ?Caseworker, Sharyn Lull, present during today's visit.  ? ?New problem(s): None.  ? ?PCP is Kathyrn Drown, MD , and last visit was September 08, 2021. ? ?Allergies  ?Allergen Reactions  ? Thorazine [Chlorpromazine Hcl] Anaphylaxis  ? Acetaminophen Other (See Comments)  ?  Makes pt dizzy  ? Aspirin Other (See Comments)  ?  seizure  ? Aspirin-Acetaminophen-Caffeine Other (See Comments)  ?  seizure  ? Nsaids Nausea And Vomiting  ? Other   ?  Acidic foods  ? Penicillins Nausea And Vomiting  ?  Has patient had a PCN reaction causing immediate rash, facial/tongue/throat swelling, SOB or lightheadedness with hypotension:Yes ?Has patient had a PCN reaction causing severe rash involving mucus membranes or skin necrosis:Yes ?Has patient had a PCN reaction that required hospitalization:Yes ?Has patient had a PCN reaction occurring within the last 10 years:No ?If all of the above answers are "NO", then may proceed with Cephalosporin use. ?  ? Tomato Rash  ? ? ?Review of Systems: Negative except as noted in the HPI. ? ?Objective: No changes noted in today's physical examination. ?[name] is a pleasant 61 y.o. female in NAD. AAO X 3. ? ?Vascular Examination: ?CFT <3 seconds b/l LE. Palpable DP/PT pulses b/l LE. Digital hair absent b/l. Skin temperature gradient WNL b/l. No pain with calf compression b/l. No edema noted b/l. No cyanosis or clubbing noted b/l LE. ? ?Dermatological Examination: ?Pedal integument with normal turgor, texture and tone b/l LE. No open wounds b/l. No interdigital macerations b/l. Toenails  1-5 b/l elongated, thickened, discolored with subungual debris. +Tenderness with dorsal palpation of nailplates. No hyperkeratotic or porokeratotic lesions present. ? ?Musculoskeletal Examination: ?Muscle strength 5/5 to all lower extremity muscle groups bilaterally. Shoe inspection reveals [employer] Sneakers with medium width which are not wide enough. HAV with bunion deformity noted b/l LE. Hammertoe deformity noted 2-5 b/l. Pes planus deformity noted bilateral LE. ? ?Neurological Examination: ?Pt has subjective symptoms of neuropathy. Protective sensation diminished with 10g monofilament b/l. ?Hemoglobin A1C Latest Ref Rng & Units 09/08/2021 06/09/2021 03/03/2021 10/25/2020  ?HGBA1C 4.8 - 5.6 % 6.5(H) 6.1(H) 6.3(H) 6.0(H)  ?Some recent data might be hidden  ? ?Assessment/Plan: ?1. Pain due to onychomycosis of toenail   ?2. Diabetic polyneuropathy associated with type 2 diabetes mellitus ([place])   ?-Consent given for treatment as described below: ?-Awaiting diabetic shoes. ?-Toenails 1-5 b/l were debrided in length and girth with sterile nail nippers and dremel without iatrogenic bleeding.  ?-Patient/POA to call should there be question/concern in the interim.  ? ?Return in about 3 months (around 12/21/2021). ? ?Marzetta Board, DPM  ?

## 2021-09-27 ENCOUNTER — Ambulatory Visit (HOSPITAL_COMMUNITY): Payer: Medicare Other

## 2021-09-27 ENCOUNTER — Encounter: Payer: Self-pay | Admitting: Internal Medicine

## 2021-09-28 ENCOUNTER — Ambulatory Visit (HOSPITAL_COMMUNITY)
Admission: RE | Admit: 2021-09-28 | Discharge: 2021-09-28 | Disposition: A | Discharge status: Home / Self Care | Payer: Medicare Other | Source: Ambulatory Visit | Attending: Family Medicine | Admitting: Family Medicine

## 2021-09-28 ENCOUNTER — Other Ambulatory Visit: Payer: Self-pay

## 2021-09-28 DIAGNOSIS — G459 Transient cerebral ischemic attack, unspecified: Secondary | ICD-10-CM | POA: Insufficient documentation

## 2021-09-28 DIAGNOSIS — E119 Type 2 diabetes mellitus without complications: Secondary | ICD-10-CM | POA: Diagnosis not present

## 2021-09-28 DIAGNOSIS — I10 Essential (primary) hypertension: Secondary | ICD-10-CM | POA: Diagnosis not present

## 2021-10-12 ENCOUNTER — Other Ambulatory Visit: Payer: Self-pay | Admitting: Family Medicine

## 2021-10-12 ENCOUNTER — Other Ambulatory Visit (HOSPITAL_COMMUNITY): Payer: Self-pay | Admitting: Psychiatry

## 2021-10-19 ENCOUNTER — Other Ambulatory Visit: Payer: Self-pay | Admitting: Family Medicine

## 2021-10-24 ENCOUNTER — Ambulatory Visit: Payer: Self-pay | Admitting: Radiation Oncology

## 2021-11-09 ENCOUNTER — Other Ambulatory Visit: Payer: Self-pay | Admitting: Gastroenterology

## 2021-11-10 ENCOUNTER — Other Ambulatory Visit: Payer: Self-pay | Admitting: *Deleted

## 2021-11-10 MED ORDER — DICYCLOMINE HCL 10 MG PO CAPS: 10.0000 mg | ORAL_CAPSULE | Freq: Three times a day (TID) | ORAL | 3 refills | Status: DC

## 2021-11-10 NOTE — Telephone Encounter (Signed)
Pt has ov for 11/22/2021 at 3:00 with Roseanne Kaufman, NP. ?

## 2021-11-10 NOTE — Progress Notes (Signed)
d 

## 2021-11-17 ENCOUNTER — Ambulatory Visit (HOSPITAL_COMMUNITY): Admission: RE | Admit: 2021-11-17 | Payer: Medicare Other | Source: Ambulatory Visit

## 2021-11-21 ENCOUNTER — Other Ambulatory Visit: Payer: Self-pay | Admitting: Gastroenterology

## 2021-11-21 ENCOUNTER — Other Ambulatory Visit: Payer: Self-pay | Admitting: Family Medicine

## 2021-11-21 ENCOUNTER — Telehealth: Payer: Self-pay | Admitting: *Deleted

## 2021-11-21 ENCOUNTER — Ambulatory Visit: Payer: Medicare Other | Admitting: Radiation Oncology

## 2021-11-21 NOTE — Telephone Encounter (Signed)
[facility] specialist stated that the life alert is a service thru their insurance for [employer] and [employer](some plans not all plans). The patient must call insurance and the company has a special case manager that sets it up and it is delivered to patient at no cost if they qualify- it does not got thru a dr or outside company. ?

## 2021-11-21 NOTE — Telephone Encounter (Signed)
Notified [name] [name](DPR) and she stated she will call insurance for patient and see if it is a covered service for the patient  ?

## 2021-11-21 NOTE — Telephone Encounter (Signed)
Patient wants to know if Dr Nicki Reaper could write a script for another life alert set to see if insurance would pay for it this time.  ? ?[facility] ?(Left message for [facility] to see if this could be covered by insurance with script from doctor) ?

## 2021-11-21 NOTE — Telephone Encounter (Signed)
Thank you for looking into that ?Please let the patient and their social worker know this thank you ?

## 2021-11-22 ENCOUNTER — Ambulatory Visit (INDEPENDENT_AMBULATORY_CARE_PROVIDER_SITE_OTHER): Payer: Medicare Other | Admitting: Gastroenterology

## 2021-11-22 ENCOUNTER — Encounter: Payer: Self-pay | Admitting: Gastroenterology

## 2021-11-22 ENCOUNTER — Ambulatory Visit: Payer: Medicare Other | Admitting: Gastroenterology

## 2021-11-22 VITALS — BP 110/60 | HR 87 | Temp 97.1°F | Ht 70.0 in | Wt 242.4 lb

## 2021-11-22 DIAGNOSIS — Z86010 Personal history of colonic polyps: Secondary | ICD-10-CM

## 2021-11-22 DIAGNOSIS — K529 Noninfective gastroenteritis and colitis, unspecified: Secondary | ICD-10-CM

## 2021-11-22 MED ORDER — HYDROCORTISONE (PERIANAL) 2.5 % EX CREA: 1.0000 "application " | TOPICAL_CREAM | Freq: Two times a day (BID) | CUTANEOUS | 1 refills | Status: DC

## 2021-11-22 NOTE — Patient Instructions (Signed)
We are arranging a colonoscopy with Dr. Gala Romney in the near future! ? ?I am finding out if anything on your medication list could be causing the diarrhea. ? ?We will see you back in follow-up after the procedure! ? ?I have sent in a cream to use twice a day per rectum. ? ?I enjoyed seeing you again today! As you know, I value our relationship and want to provide genuine, compassionate, and quality care. I welcome your feedback. If you receive a survey regarding your visit,  I greatly appreciate you taking time to fill this out. See you next time! ? ?Annitta Needs, PhD, ANP-BC ?[facility]  ? ?

## 2021-11-22 NOTE — Progress Notes (Unsigned)
Gastroenterology Office Note     Primary Care Physician:  Kathyrn Drown, MD  Primary Gastroenterologist: Dr. Gala Romney    Chief Complaint   Chief Complaint  Patient presents with   Follow-up    Consult for colonoscopy, pt wants Rx for Prep H cream     History of Present Illness   [name] is a 61 y.o. female presenting today in follow-up with a history of GERD, IBS, dysphagia, colonoscopy due in 2023 (history of adenomas). Last EGD in July 2021 with empiric dilation. She also had H.pylori on biopsies and needs H.pylori eradication in future. Found to have carcinoid tumor right lung in Dec 2022 and has undergone SBRT with radiation Oncology.   Notes frequent loose stool.  This has been going on for months. We have been unsuccessful with obtaining stool studies. Patient is difficult historian. Feels her rectum is raw. Dicyclomine is helpful with cramping.   Has Senna and Colace listed on her list. Taking Aciphex BID.    Past Medical History:  Diagnosis Date   Anxiety    Aortic atherosclerosis ([place]) 09/19/2020   Seen on CAT scan 2021   Arthritis    Depression    Diabetes mellitus    Diabetes mellitus, type II ([place])    Dyspnea    Dysrhythmia    GERD (gastroesophageal reflux disease)    Heart murmur    HTN (hypertension)    Hyperglycemia    IBS (irritable bowel syndrome)    Lung nodules    right, followed by PCP, PET 11/2011   Mental retardation    MI (myocardial infarction) ([place])    Migraines    Pneumonia    Sleep apnea    cpap    Stroke [facility])     Past Surgical History:  Procedure Laterality Date   ABDOMINAL HYSTERECTOMY     BIOPSY  01/15/2020   Procedure: BIOPSY;  Surgeon: Daneil Dolin, MD;  [facility];  Service: Endoscopy;;  gastric   BRONCHIAL BIOPSY  06/14/2021   Procedure: BRONCHIAL BIOPSIES;  Surgeon: Garner Nash, DO;  Location: [place] [facility]OSCOPY;  Service: Pulmonary;;   BRONCHIAL BRUSHINGS  06/14/2021   Procedure: BRONCHIAL  BRUSHINGS;  Surgeon: Garner Nash, DO;  Location: [place] [facility]OSCOPY;  Service: Pulmonary;;   BRONCHIAL NEEDLE ASPIRATION BIOPSY  06/14/2021   Procedure: BRONCHIAL NEEDLE ASPIRATION BIOPSIES;  Surgeon: Garner Nash, DO;  Location: [place] ENDOSCOPY;  Service: Pulmonary;;   COLONOSCOPY  11/2007   hyperplastic polyps, prior hx of adenomas    COLONOSCOPY  05/2010   incomplete due to poor prep, hyperplastic rectal polyp   COLONOSCOPY  05/05/2002   Dimunitive polyps in the rectum and left colon, cold    biopsied/removed.  Scattered few left-sided diverticula.  Regular colonic   mucosa appeared normal   COLONOSCOPY N/A 05/28/2013   Rourk: mulitple tubular adenomas removed. next tcs 05/2016   COLONOSCOPY WITH PROPOFOL N/A 09/07/2016   Dr. Gala Romney: For hyperplastic polyps removed. Next colonoscopy March 2023 given history of adenomatous colon polyps in the past.   ESOPHAGOGASTRODUODENOSCOPY  08/2007   moderate sized hiatal hernia   ESOPHAGOGASTRODUODENOSCOPY  05/2010   noncritical schatzki ring s/p 87F   ESOPHAGOGASTRODUODENOSCOPY (EGD) WITH ESOPHAGEAL DILATION N/A 02/06/2013   ZJQ:BHALPF esophagus-s/p dilation up to a 12 [place] size with [facility] dilators.  Hiatal hernia   ESOPHAGOGASTRODUODENOSCOPY (EGD) WITH PROPOFOL N/A 09/07/2016   Dr. Gala Romney: Normal, status post empiric dilation of the esophagus for history  of dysphagia   ESOPHAGOGASTRODUODENOSCOPY (EGD) WITH PROPOFOL N/A 05/02/2019   Normal esophagus s/p dilation, normal stomach, normal duodenum   ESOPHAGOGASTRODUODENOSCOPY (EGD) WITH PROPOFOL N/A 01/15/2020   normal esophagus s/p dilation, gastric nodule s/p biopsy. This showed reactive gastropathy with H.pylori.    EXTERNAL EAR SURGERY     bilateral   FIDUCIAL MARKER PLACEMENT  06/14/2021   Procedure: FIDUCIAL MARKER PLACEMENT;  Surgeon: Garner Nash, DO;  Location: [place] ENDOSCOPY;  Service: Pulmonary;;   FOOT SURGERY     GLAUCOMA SURGERY     KNEE ARTHROPLASTY Right 11/04/2020   Procedure: COMPUTER  ASSISTED TOTAL KNEE ARTHROPLASTY;  Surgeon: Rod Can, MD;  Location: [facility];  Service: Orthopedics;  Laterality: Right;   MALONEY DILATION N/A 09/07/2016   Procedure: Venia Minks DILATION;  Surgeon: Daneil Dolin, MD;  [facility];  Service: Endoscopy;  Laterality: N/A;   MALONEY DILATION N/A 05/02/2019   Procedure: Venia Minks DILATION;  Surgeon: Daneil Dolin, MD;  [facility];  Service: Endoscopy;  Laterality: N/A;   MALONEY DILATION N/A 01/15/2020   Procedure: Venia Minks DILATION;  Surgeon: Daneil Dolin, MD;  [facility];  Service: Endoscopy;  Laterality: N/A;   POLYPECTOMY  09/07/2016   Procedure: POLYPECTOMY;  Surgeon: Daneil Dolin, MD;  [facility];  Service: Endoscopy;;  sigmoid colon x4   small bowel capsule  10/2007   normal   VIDEO BRONCHOSCOPY WITH RADIAL ENDOBRONCHIAL ULTRASOUND  06/14/2021   Procedure: VIDEO BRONCHOSCOPY WITH RADIAL ENDOBRONCHIAL ULTRASOUND;  Surgeon: Garner Nash, DO;  Location: [facility];  Service: Pulmonary;;    Current Outpatient Medications  Medication Sig Dispense Refill   atorvastatin (LIPITOR) 10 MG tablet Take 1 tablet (10 mg total) by mouth daily. 30 tablet 6   benztropine (COGENTIN) 1 MG tablet Take 1 tablet (1 mg total) by mouth daily. 30 tablet 5   cycloSPORINE (RESTASIS) 0.05 % ophthalmic emulsion Place 1 drop into both eyes 2 (two) times daily.     dicyclomine (BENTYL) 10 MG capsule Take 1 capsule (10 mg total) by mouth 4 (four) times daily -  before meals and at bedtime. 90 capsule 3   docusate sodium (COLACE) 100 MG capsule TAKE 1 CAPSULE BY MOUTH TWICE DAILY 60 capsule 1   Fluticasone-Umeclidin-Vilant (TRELEGY ELLIPTA) 100-62.5-25 MCG/ACT AEPB Inhale 1 puff into the lungs daily. 60 each 0   Fluticasone-Umeclidin-Vilant (TRELEGY ELLIPTA) 100-62.5-25 MCG/ACT AEPB Inhale 1 puff into the lungs daily. 60 each 0   hydrocortisone (ANUSOL-HC) 2.5 % rectal cream Place 1 application. rectally 2 (two)  times daily. 30 g 1   ondansetron (ZOFRAN) 4 MG tablet Take 1 tablet (4 mg total) by mouth every 6 (six) hours as needed for nausea. 20 tablet 0   paliperidone (INVEGA) 9 MG 24 hr tablet Take 1 tablet (9 mg total) by mouth daily. 30 tablet 5   potassium chloride (KLOR-CON) 10 MEQ tablet TAKE 2 TABLETS (20MEQ) BY MOUTH TWICE DAILY WITH FOOD 120 tablet 0   propranolol (INDERAL) 10 MG tablet TAKE 1 TABLET BY MOUTH THREE TIMES A DAY. 90 tablet 0   RABEprazole (ACIPHEX) 20 MG tablet TAKE 1 TABLET BY MOUTH TWICE A DAY. 60 tablet 2   SENNA-TABS 8.6 MG tablet TAKE 1 TABLET BY MOUTH 2 TIMES A DAY 60 tablet 0   sertraline (ZOLOFT) 100 MG tablet Take 1 tablet (100 mg total) by mouth daily. 30 tablet 5   torsemide (DEMADEX) 20 MG tablet TAKE (1) TABLET  BY MOUTH EACH MORNING. 30 tablet 0   traZODone (DESYREL) 100 MG tablet TAKE 2 TABLETS(200MG ) BY MOUTH AT BEDTIME. 60 tablet 5   TRELEGY ELLIPTA 100-62.5-25 MCG/ACT AEPB INHALE (1) PUFF INTO THE LUNGS ONCE DAILY. 60 each 6   polyethylene glycol-electrolytes (NULYTELY) 420 g solution As directed 4000 mL 0   No current facility-administered medications for this visit.    Allergies as of 11/22/2021 - Review Complete 11/22/2021  Allergen Reaction Noted   Thorazine [chlorpromazine hcl] Anaphylaxis 10/20/2010   Acetaminophen Other (See Comments)    Aspirin Other (See Comments)    Aspirin-acetaminophen-caffeine Other (See Comments)    Nsaids Nausea And Vomiting    Other  03/22/2019   Penicillins Nausea And Vomiting    Tomato Rash 01/17/2012    Family History  Problem Relation Age of Onset   Stroke Mother    Heart attack Father    Schizophrenia Other    Drug abuse Other    Alcohol abuse Other    Colon cancer Other        aunt   Obesity Other    COPD Other    Anxiety disorder Other    GER disease Other    Diabetes type II Other    Anxiety disorder Other    Depression Other    Depression Sister    Schizophrenia Sister    Liver disease Neg Hx     Inflammatory bowel disease Neg Hx     Social History   Socioeconomic History   Marital status: Divorced    Spouse name: Not on file   Number of children: Not on file   Years of education: Not on file   Highest education level: High school graduate  Occupational History   Occupation: disabled    Employer: UNEMPLOYED  Tobacco Use   Smoking status: Every Day    Packs/day: 0.50    Years: 40.00    Pack years: 20.00    Types: Cigarettes   Smokeless tobacco: Never  Vaping Use   Vaping Use: Never used  Substance and Sexual Activity   Alcohol use: No   Drug use: No   Sexual activity: Not Currently    Partners: Male    Birth control/protection: Surgical    Comment: hyst  Other Topics Concern   Not on file  Social History Narrative   Not on file   Social Determinants of Health   Financial Resource Strain: Low Risk    Difficulty of Paying Living Expenses: Not hard at all  Food Insecurity: No Food Insecurity   Worried About Charity fundraiser in the Last Year: Never true   [place] in the Last Year: Never true  Transportation Needs: No Transportation Needs   Lack of Transportation (Medical): No   Lack of Transportation (Non-Medical): No  Physical Activity: Sufficiently Active   Days of Exercise per Week: 5 days   Minutes of Exercise per Session: 40 min  Stress: No Stress Concern Present   Feeling of Stress : Only a little  Social Connections: Not on file  Intimate Partner Violence: Not At Risk   Fear of Current or Ex-Partner: No   Emotionally Abused: No   Physically Abused: No   Sexually Abused: No     Review of Systems   Gen: Denies any fever, chills, fatigue, weight loss, lack of appetite.  CV: Denies chest pain, heart palpitations, peripheral edema, syncope.  Resp: Denies shortness of breath at rest or with exertion. Denies wheezing  or cough.  GI: see HPI GU : Denies urinary burning, urinary frequency, urinary hesitancy MS: Denies joint pain, muscle  weakness, cramps, or limitation of movement.  Derm: Denies rash, itching, dry skin Psych: Denies depression, anxiety, memory loss, and confusion Heme: Denies bruising, bleeding, and enlarged lymph nodes.   Physical Exam   BP 110/60   Pulse 87   Temp (!) 97.1 F (36.2 C)   Ht 5\' 10"  (1.778 m)   Wt 242 lb 6.4 oz (110 kg)   BMI 34.78 kg/m  General:   Alert and oriented. Pleasant and cooperative. Well-nourished and well-developed.  Head:  Normocephalic and atraumatic. Eyes:  Without icterus Abdomen:  +BS, soft, non-tender and non-distended. No HSM noted. No guarding or rebound. No masses appreciated.  Rectal:  no prolapsing hemorrhoids, DRE without mass Msk:  Symmetrical without gross deformities. Normal posture. Extremities:  Without edema. Neurologic:  Alert and  oriented x4;  grossly normal neurologically. Skin:  Intact without significant lesions or rashes. Psych:  Alert and cooperative. Normal mood and affect.   Assessment   [name] is a 61 y.o. female presenting today in follow-up with a history of GERD, IBS, dysphagia, colonoscopy due in 2023 (history of adenomas). Last EGD in July 2021 with empiric dilation. She also had H.pylori on biopsies and needs H.pylori eradication in future. Found to have carcinoid tumor right lung in Dec 2022 and has undergone SBRT with radiation Oncology.   Diarrhea: for many months. Difficult historian. We have been unable to obtain stool samples. Low likelihood of infectious process in light of chronicity. She is actually due for colonoscopy now. I note her med list has Senna and Colace listed. We will need to look further into this, as she is a difficult historian.   GERD: continue with Aciphex BID  Notes frequent loose stool.  This has been going on for months. We have been unsuccessful with obtaining stool studies. Patient is difficult historian. Feels her rectum is raw. Dicyclomine is helpful with cramping.   Has Senna and Colace  listed on her list. Taking Aciphex BID.   PLAN   Proceed with colonoscopy and random colonic biopsies by Dr. Gala Romney in near future: the risks, benefits, and alternatives have been discussed with the patient in detail. The patient states understanding and desires to proceed. Anusol cream BID Will check with pharmacy regarding ?senna and colace Aciphex BID Return in follow-up after procedure   Annitta Needs, PhD, ANP-BC [facility]

## 2021-11-30 ENCOUNTER — Encounter: Payer: Self-pay | Admitting: *Deleted

## 2021-11-30 ENCOUNTER — Telehealth: Payer: Self-pay | Admitting: *Deleted

## 2021-11-30 MED ORDER — PEG 3350-KCL-NA BICARB-NACL 420 G PO SOLR: ORAL | 0 refills | Status: DC

## 2021-11-30 NOTE — Telephone Encounter (Signed)
Called pt, LMOVM to call back for Micheele Bullins to schedule TCS with Dr. Gala Romney, ASA 3

## 2021-11-30 NOTE — Telephone Encounter (Signed)
[name] called back. Patient scheduled for 7/19 at 11:30am. Advised me to send rx [facility] prep to [facility] and fax instructions with pre-op appt to [phone].    PA submitted via [place]. PA approved. Auth# [id number], DOS: Jan 25, 2022 - Apr 25, 2022

## 2021-12-01 DIAGNOSIS — Z1152 Encounter for screening for COVID-19: Secondary | ICD-10-CM | POA: Diagnosis not present

## 2021-12-07 ENCOUNTER — Ambulatory Visit
Admission: EM | Admit: 2021-12-07 | Discharge: 2021-12-07 | Disposition: A | Discharge status: Home / Self Care | Payer: Medicare Other | Attending: Nurse Practitioner | Admitting: Nurse Practitioner

## 2021-12-07 DIAGNOSIS — K649 Unspecified hemorrhoids: Secondary | ICD-10-CM | POA: Diagnosis not present

## 2021-12-07 MED ORDER — HYDROCORTISONE ACETATE 25 MG RE SUPP: 25.0000 mg | Freq: Two times a day (BID) | RECTAL | 0 refills | Status: DC | PRN

## 2021-12-07 MED ORDER — SENNA 8.6 MG PO TABS: 2.0000 | ORAL_TABLET | Freq: Every day | ORAL | 0 refills | Status: AC

## 2021-12-07 NOTE — ED Provider Notes (Signed)
[facility]    CSN: [id number] Arrival date & time: 12/07/21  1750      History   Chief Complaint Chief Complaint  Patient presents with   Rectal Pain    HPI [name] is a 61 y.o. female.   The history is provided by the patient.  Patient presents for complaints of hemorrhoids.  Patient states that her symptoms started this morning after a bowel movement.  Patient endorses a history of constipation.  Patient states she has normally been using over-the-counter cream that has been prescribed.  In review of her history, she also has been prescribed rectal cream for her symptoms.  Patient also complains of abdominal pain with decreased appetite.  She denies nausea, vomiting, fever, chills, or diarrhea.  Past Medical History:  Diagnosis Date   Anxiety    Aortic atherosclerosis ([place]) 09/19/2020   Seen on CAT scan 2021   Arthritis    Depression    Diabetes mellitus    Diabetes mellitus, type II ([place])    Dyspnea    Dysrhythmia    GERD (gastroesophageal reflux disease)    Heart murmur    HTN (hypertension)    Hyperglycemia    IBS (irritable bowel syndrome)    Lung nodules    right, followed by PCP, PET 11/2011   Mental retardation    MI (myocardial infarction) ([place])    Migraines    Pneumonia    Sleep apnea    cpap    Stroke [facility])     Patient Active Problem List   Diagnosis Date Noted   Chronic diarrhea 11/22/2021   Primary malignant neoplasm of right middle lobe of lung ([place]) 09/08/2021   Lung nodule 06/14/2021   S/P bronchoscopy with biopsy    Degenerative arthritis of right knee 11/04/2020   Osteoarthritis of right knee 11/04/2020   Aortic atherosclerosis ([place]) 83/38/2505   History of Helicobacter pylori infection 07/23/2020   S/P hysterectomy 04/19/2020   History of vaginal bleeding 04/19/2020   Fall in bathtub    Neck pain    Primary osteoarthritis of both knees 01/27/2019   Hyperlipidemia 09/26/2017   Dysphagia 08/15/2016   Obstructive  sleep apnea syndrome 06/27/2016   Rectal bleeding 04/07/2013   Melena 03/04/2013   Hematemesis 03/04/2013   Abdominal pain, epigastric 03/04/2013   Hypokalemia 03/04/2013   Smoker 01/16/2012   Diarrhea 12/26/2011   Carcinoid tumor of right lung    Abdominal pain 10/08/2011   Fatty liver 08/24/2011   Constipation 10/20/2010   NAUSEA AND VOMITING 06/09/2010   HEMATOCHEZIA 04/21/2010   OTHER DYSPHAGIA 04/21/2010   MILD MENTAL RETARDATION 02/17/2009   GLAUCOMA 02/17/2009   ANXIETY 01/15/2009   MIGRAINE HEADACHE 01/15/2009   Essential hypertension 01/15/2009   HEMORRHOIDS 01/15/2009   GASTROESOPHAGEAL REFLUX DISEASE, CHRONIC 01/15/2009   CONSTIPATION, CHRONIC 01/15/2009   IBS (irritable bowel syndrome) 01/15/2009   KNEE PAIN, CHRONIC 01/15/2009   BACK PAIN, CHRONIC 01/15/2009   History of colonic polyps 01/15/2009    Past Surgical History:  Procedure Laterality Date   ABDOMINAL HYSTERECTOMY     BIOPSY  01/15/2020   Procedure: BIOPSY;  Surgeon: Daneil Dolin, MD;  [facility];  Service: Endoscopy;;  gastric   BRONCHIAL BIOPSY  06/14/2021   Procedure: BRONCHIAL BIOPSIES;  Surgeon: Garner Nash, DO;  Location: [facility];  Service: Pulmonary;;   BRONCHIAL BRUSHINGS  06/14/2021   Procedure: BRONCHIAL BRUSHINGS;  Surgeon: Garner Nash, DO;  Location: [place];  Service:  Pulmonary;;   BRONCHIAL NEEDLE ASPIRATION BIOPSY  06/14/2021   Procedure: BRONCHIAL NEEDLE ASPIRATION BIOPSIES;  Surgeon: Garner Nash, DO;  Location: [facility];  Service: Pulmonary;;   COLONOSCOPY  11/2007   hyperplastic polyps, prior hx of adenomas    COLONOSCOPY  05/2010   incomplete due to poor prep, hyperplastic rectal polyp   COLONOSCOPY  05/05/2002   Dimunitive polyps in the rectum and left colon, cold    biopsied/removed.  Scattered few left-sided diverticula.  Regular colonic   mucosa appeared normal   COLONOSCOPY N/A 05/28/2013   Rourk: mulitple tubular adenomas removed. next  tcs 05/2016   COLONOSCOPY WITH PROPOFOL N/A 09/07/2016   Dr. Gala Romney: For hyperplastic polyps removed. Next colonoscopy March 2023 given history of adenomatous colon polyps in the past.   ESOPHAGOGASTRODUODENOSCOPY  08/2007   moderate sized hiatal hernia   ESOPHAGOGASTRODUODENOSCOPY  05/2010   noncritical schatzki ring s/p 46F   ESOPHAGOGASTRODUODENOSCOPY (EGD) WITH ESOPHAGEAL DILATION N/A 02/06/2013   DJS:HFWYOV esophagus-s/p dilation up to a 54 [place] size with [facility] dilators.  Hiatal hernia   ESOPHAGOGASTRODUODENOSCOPY (EGD) WITH PROPOFOL N/A 09/07/2016   Dr. Gala Romney: Normal, status post empiric dilation of the esophagus for history of dysphagia   ESOPHAGOGASTRODUODENOSCOPY (EGD) WITH PROPOFOL N/A 05/02/2019   Normal esophagus s/p dilation, normal stomach, normal duodenum   ESOPHAGOGASTRODUODENOSCOPY (EGD) WITH PROPOFOL N/A 01/15/2020   normal esophagus s/p dilation, gastric nodule s/p biopsy. This showed reactive gastropathy with H.pylori.    EXTERNAL EAR SURGERY     bilateral   FIDUCIAL MARKER PLACEMENT  06/14/2021   Procedure: FIDUCIAL MARKER PLACEMENT;  Surgeon: Garner Nash, DO;  Location: [place] [facility];  Service: Pulmonary;;   FOOT SURGERY     GLAUCOMA SURGERY     KNEE ARTHROPLASTY Right 11/04/2020   Procedure: COMPUTER ASSISTED TOTAL KNEE ARTHROPLASTY;  Surgeon: Rod Can, MD;  Location: [facility];  Service: Orthopedics;  Laterality: Right;   MALONEY DILATION N/A 09/07/2016   Procedure: Venia Minks DILATION;  Surgeon: Daneil Dolin, MD;  [facility];  Service: Endoscopy;  Laterality: N/A;   MALONEY DILATION N/A 05/02/2019   Procedure: Venia Minks DILATION;  Surgeon: Daneil Dolin, MD;  [facility];  Service: Endoscopy;  Laterality: N/A;   MALONEY DILATION N/A 01/15/2020   Procedure: Venia Minks DILATION;  Surgeon: Daneil Dolin, MD;  [facility];  Service: Endoscopy;  Laterality: N/A;   POLYPECTOMY  09/07/2016   Procedure: POLYPECTOMY;  Surgeon: Daneil Dolin, MD;  [facility];  Service: Endoscopy;;  sigmoid colon x4   small bowel capsule  10/2007   normal   VIDEO BRONCHOSCOPY WITH RADIAL ENDOBRONCHIAL ULTRASOUND  06/14/2021   Procedure: VIDEO BRONCHOSCOPY WITH RADIAL ENDOBRONCHIAL ULTRASOUND;  Surgeon: Garner Nash, DO;  Location: [facility];  Service: Pulmonary;;    OB History     Gravida  3   Para      Term      Preterm      AB  2   Living  0      SAB      IAB      Ectopic  1   Multiple      Live Births               Home Medications    Prior to Admission medications   Medication Sig Start Date End Date Taking? Authorizing Provider  hydrocortisone (ANUSOL-HC) 25 MG suppository Place 1 suppository (25 mg total) rectally 2 (  two) times daily as needed for hemorrhoids or anal itching. 12/07/21  Yes Cheyenna Pankowski-Warren, Alda Lea, NP  senna (SENOKOT) 8.6 MG TABS tablet Take 2 tablets (17.2 mg total) by mouth daily. 12/07/21 01/06/22 Yes Geniva Lohnes-Warren, Alda Lea, NP  atorvastatin (LIPITOR) 10 MG tablet Take 1 tablet (10 mg total) by mouth daily. 09/08/21   Kathyrn Drown, MD  benztropine (COGENTIN) 1 MG tablet Take 1 tablet (1 mg total) by mouth daily. 08/31/21   Cloria Spring, MD  cycloSPORINE (RESTASIS) 0.05 % ophthalmic emulsion Place 1 drop into both eyes 2 (two) times daily.    [provider]  dicyclomine (BENTYL) 10 MG capsule Take 1 capsule (10 mg total) by mouth 4 (four) times daily -  before meals and at bedtime. 11/10/21   Rourk, [name], MD  docusate sodium (COLACE) 100 MG capsule TAKE 1 CAPSULE BY MOUTH TWICE DAILY 10/12/21   Kathyrn Drown, MD  Fluticasone-Umeclidin-Vilant (TRELEGY ELLIPTA) 100-62.5-25 MCG/ACT AEPB Inhale 1 puff into the lungs daily. 09/12/21   Icard, Octavio Graves, DO  Fluticasone-Umeclidin-Vilant (TRELEGY ELLIPTA) 100-62.5-25 MCG/ACT AEPB Inhale 1 puff into the lungs daily. 09/12/21   Icard, Octavio Graves, DO  hydrocortisone (ANUSOL-HC) 2.5 % rectal cream Place 1 application.  rectally 2 (two) times daily. 11/22/21   Annitta Needs, NP  ondansetron (ZOFRAN) 4 MG tablet Take 1 tablet (4 mg total) by mouth every 6 (six) hours as needed for nausea. 11/09/20   Dorothyann Peng, PA-C  paliperidone (INVEGA) 9 MG 24 hr tablet Take 1 tablet (9 mg total) by mouth daily. 08/31/21   Cloria Spring, MD  polyethylene glycol-electrolytes (NULYTELY) 420 g solution As directed 11/30/21   Rourk, [name], MD  potassium chloride (KLOR-CON) 10 MEQ tablet TAKE 2 TABLETS (20MEQ) BY MOUTH TWICE DAILY WITH FOOD 11/21/21   Kathyrn Drown, MD  propranolol (INDERAL) 10 MG tablet TAKE 1 TABLET BY MOUTH THREE TIMES A DAY. 11/21/21   Kathyrn Drown, MD  RABEprazole (ACIPHEX) 20 MG tablet TAKE 1 TABLET BY MOUTH TWICE A DAY. 07/30/21   Mahala Menghini, PA-C  sertraline (ZOLOFT) 100 MG tablet Take 1 tablet (100 mg total) by mouth daily. 08/31/21   Cloria Spring, MD  torsemide (DEMADEX) 20 MG tablet TAKE (1) TABLET BY MOUTH EACH MORNING. 11/21/21   Kathyrn Drown, MD  traZODone (DESYREL) 100 MG tablet TAKE 2 TABLETS(200MG ) BY MOUTH AT BEDTIME. 08/31/21   Cloria Spring, MD  TRELEGY ELLIPTA 100-62.5-25 MCG/ACT AEPB INHALE (1) PUFF INTO THE LUNGS ONCE DAILY. 10/19/21   Kathyrn Drown, MD    Family History Family History  Problem Relation Age of Onset   Stroke Mother    Heart attack Father    Schizophrenia Other    Drug abuse Other    Alcohol abuse Other    Colon cancer Other        aunt   Obesity Other    COPD Other    Anxiety disorder Other    GER disease Other    Diabetes type II Other    Anxiety disorder Other    Depression Other    Depression Sister    Schizophrenia Sister    Liver disease Neg Hx    Inflammatory bowel disease Neg Hx     Social History Social History   Tobacco Use   Smoking status: Every Day    Packs/day: 0.50    Years: 40.00    Pack years: 20.00    Types:  Cigarettes   Smokeless tobacco: Never  Vaping Use   Vaping Use: Never used  Substance Use Topics    Alcohol use: No   Drug use: No     Allergies   Thorazine [chlorpromazine hcl], Acetaminophen, Aspirin, Aspirin-acetaminophen-caffeine, Nsaids, Other, Penicillins, and Tomato   Review of Systems Review of Systems Per HPI  Physical Exam Triage Vital Signs ED Triage Vitals  Enc Vitals Group     BP 12/07/21 1826 120/80     Pulse Rate 12/07/21 1826 79     Resp 12/07/21 1826 18     Temp 12/07/21 1826 98.1 F (36.7 C)     Temp Source 12/07/21 1826 Oral     SpO2 12/07/21 1826 98 %     Weight --      Height --      Head Circumference --      Peak Flow --      Pain Score 12/07/21 1825 10     Pain Loc --      Pain Edu? --      Excl. in [place]? --    No data found.  Updated Vital Signs BP 120/80 (BP Location: Right Arm)   Pulse 79   Temp 98.1 F (36.7 C) (Oral)   Resp 18   SpO2 98%   Visual Acuity Right Eye Distance:   Left Eye Distance:   Bilateral Distance:    Right Eye Near:   Left Eye Near:    Bilateral Near:     Physical Exam Vitals and nursing note reviewed.  Constitutional:      General: She is not in acute distress.    Appearance: Normal appearance.  HENT:     Head: Normocephalic.  Cardiovascular:     Rate and Rhythm: Normal rate and regular rhythm.     Pulses: Normal pulses.     Heart sounds: Normal heart sounds.  Pulmonary:     Effort: Pulmonary effort is normal.     Breath sounds: Normal breath sounds.  Abdominal:     General: Bowel sounds are normal. There is no distension.     Palpations: Abdomen is soft.     Tenderness: There is no abdominal tenderness.  Skin:    General: Skin is warm and dry.     Capillary Refill: Capillary refill takes less than 2 seconds.  Neurological:     General: No focal deficit present.     Mental Status: She is alert and oriented to person, place, and time.  Psychiatric:        Mood and Affect: Mood normal.        Behavior: Behavior normal.     UC Treatments / Results  Labs (all labs ordered are listed, but  only abnormal results are displayed) Labs Reviewed - No data to display  EKG   Radiology No results found.  Procedures Procedures (including critical care time)  Medications Ordered in UC Medications - No data to display  Initial Impression / Assessment and Plan / UC Course  I have reviewed the triage vital signs and the nursing notes.  Pertinent labs & imaging results that were available during my care of the patient were reviewed by me and considered in my medical decision making (see chart for details).  The patient presents for complaints of hemorrhoids.  She endorses a history of hemorrhoids along with chronic constipation.  Patient's chart also indicates that she does have a history of hemorrhoids.  We will treat patient with Anusol suppositories.  Also provided a prescription for senna to help with her constipation.  Patient advised to increase the fiber in her diet along with fluid intake.  Supportive care recommendations were provided.  Patient advised to follow-up with her PCP or with gastroenterology if her symptoms do not improve. Final Clinical Impressions(s) / UC Diagnoses   Final diagnoses:  Hemorrhoids, unspecified hemorrhoid type     Discharge Instructions      Take medication as prescribed. Recommend eating foods that are high in fiber to prevent constipation. Increase fluids to help decrease symptoms of constipation. May take over-the-counter Tylenol as needed for abdominal pain. Recommend Epsom salt soaks for rectal pain or discomfort. If your symptoms do not improve, please follow-up with your primary care physician or gastroenterology.     ED Prescriptions     Medication Sig Dispense Auth. Provider   hydrocortisone (ANUSOL-HC) 25 MG suppository Place 1 suppository (25 mg total) rectally 2 (two) times daily as needed for hemorrhoids or anal itching. 12 suppository Doranne Schmutz-Warren, Alda Lea, NP   senna (SENOKOT) 8.6 MG TABS tablet Take 2 tablets (17.2  mg total) by mouth daily. 60 tablet Anel Purohit-Warren, Alda Lea, NP      PDMP not reviewed this encounter.   Tish Men, NP 12/07/21 1858

## 2021-12-07 NOTE — ED Triage Notes (Signed)
Pt reports ractal pain due to hemorrhoids since this morning.

## 2021-12-07 NOTE — Discharge Instructions (Addendum)
Take medication as prescribed. Recommend eating foods that are high in fiber to prevent constipation. Increase fluids to help decrease symptoms of constipation. May take over-the-counter Tylenol as needed for abdominal pain. Recommend Epsom salt soaks for rectal pain or discomfort. If your symptoms do not improve, please follow-up with your primary care physician or gastroenterology.

## 2021-12-09 ENCOUNTER — Ambulatory Visit (INDEPENDENT_AMBULATORY_CARE_PROVIDER_SITE_OTHER): Payer: Medicare Other | Admitting: Family Medicine

## 2021-12-09 VITALS — BP 120/70 | HR 74 | Temp 97.2°F | Ht 70.0 in | Wt 244.2 lb

## 2021-12-09 DIAGNOSIS — E1169 Type 2 diabetes mellitus with other specified complication: Secondary | ICD-10-CM

## 2021-12-09 DIAGNOSIS — E785 Hyperlipidemia, unspecified: Secondary | ICD-10-CM

## 2021-12-09 DIAGNOSIS — Z1152 Encounter for screening for COVID-19: Secondary | ICD-10-CM | POA: Diagnosis not present

## 2021-12-09 NOTE — Progress Notes (Signed)
   Subjective:    Patient ID: [name], female    DOB: 06/21/1961, 61 y.o.   MRN: [id number]  HPI  Patient here for 3 month follow up. Patient has knot on back of left leg. Patient stated that this is bothering her she does not know how long its been lasting denies any swelling the social worker states this is the first she heard of it was not aware of any problems before that  Review of Systems     Objective:   Physical Exam  General-in no acute distress Eyes-no discharge Lungs-respiratory rate normal, CTA CV-no murmurs,RRR Extremities skin warm dry no edema Neuro grossly normal Behavior normal, alert       Assessment & Plan:   Very nice patient But she is highly symptomatic She complains of some sharp pain behind her left calf There is no tenderness no swelling Patient and social worker educated that if this area swells need to do ultrasound Lungs clear overall pulmonary doing well More than likely do a scan later this year Lab work before next visit Follow-up in 3 months

## 2021-12-15 DIAGNOSIS — Z20822 Contact with and (suspected) exposure to covid-19: Secondary | ICD-10-CM | POA: Diagnosis not present

## 2021-12-16 ENCOUNTER — Other Ambulatory Visit: Payer: Self-pay | Admitting: Gastroenterology

## 2021-12-21 DIAGNOSIS — Z20822 Contact with and (suspected) exposure to covid-19: Secondary | ICD-10-CM | POA: Diagnosis not present

## 2021-12-27 ENCOUNTER — Ambulatory Visit: Payer: Medicare Other | Admitting: Podiatry

## 2022-01-02 ENCOUNTER — Telehealth: Payer: Self-pay | Admitting: *Deleted

## 2022-01-03 ENCOUNTER — Telehealth: Payer: Self-pay | Admitting: *Deleted

## 2022-01-03 DIAGNOSIS — E785 Hyperlipidemia, unspecified: Secondary | ICD-10-CM | POA: Diagnosis not present

## 2022-01-03 DIAGNOSIS — E1169 Type 2 diabetes mellitus with other specified complication: Secondary | ICD-10-CM | POA: Diagnosis not present

## 2022-01-03 NOTE — Telephone Encounter (Signed)
Called patient's sponsor Claudette Stapler) to inform that CT has been moved to 01-13-22- arrival time- 2:30 pm, patient to have water only- 4 hrs. prior to test, patient to receive results from Laurence Aly on 01-16-22 @ 4 pm via telephone, lvm for a return call

## 2022-01-04 ENCOUNTER — Encounter: Payer: Self-pay | Admitting: Family Medicine

## 2022-01-04 LAB — BASIC METABOLIC PANEL
BUN/Creatinine Ratio: 7 — ABNORMAL LOW (ref 9–23)
BUN: 6 mg/dL (ref 6–24)
CO2: 26 mmol/L (ref 20–29)
Calcium: 9.5 mg/dL (ref 8.7–10.2)
Chloride: 103 mmol/L (ref 96–106)
Creatinine, Ser: 0.84 mg/dL (ref 0.57–1.00)
Glucose: 97 mg/dL (ref 70–99)
Potassium: 4.2 mmol/L (ref 3.5–5.2)
Sodium: 143 mmol/L (ref 134–144)
eGFR: 80 mL/min/{1.73_m2} (ref >59)

## 2022-01-04 LAB — HEPATIC FUNCTION PANEL
ALT: 17 IU/L (ref 0–32)
AST: 27 IU/L (ref 0–40)
Albumin: 4.3 g/dL (ref 3.8–4.9)
Alkaline Phosphatase: 103 IU/L (ref 44–121)
Bilirubin Total: 0.3 mg/dL (ref 0.0–1.2)
Bilirubin, Direct: 0.11 mg/dL (ref 0.00–0.40)
Total Protein: 6.7 g/dL (ref 6.0–8.5)

## 2022-01-04 LAB — HEMOGLOBIN A1C
Est. average glucose Bld gHb Est-mCnc: 134 mg/dL
Hgb A1c MFr Bld: 6.3 % — ABNORMAL HIGH (ref 4.8–5.6)

## 2022-01-05 ENCOUNTER — Other Ambulatory Visit (HOSPITAL_COMMUNITY): Payer: Medicare Other

## 2022-01-06 ENCOUNTER — Ambulatory Visit (INDEPENDENT_AMBULATORY_CARE_PROVIDER_SITE_OTHER): Payer: Medicare Other | Admitting: Nurse Practitioner

## 2022-01-06 ENCOUNTER — Encounter: Payer: Self-pay | Admitting: Nurse Practitioner

## 2022-01-06 VITALS — BP 124/68 | HR 77 | Temp 98.2°F | Ht 70.0 in | Wt 243.6 lb

## 2022-01-06 DIAGNOSIS — D3A090 Benign carcinoid tumor of the bronchus and lung: Secondary | ICD-10-CM | POA: Diagnosis not present

## 2022-01-06 DIAGNOSIS — F17200 Nicotine dependence, unspecified, uncomplicated: Secondary | ICD-10-CM

## 2022-01-06 DIAGNOSIS — R053 Chronic cough: Secondary | ICD-10-CM

## 2022-01-06 DIAGNOSIS — K219 Gastro-esophageal reflux disease without esophagitis: Secondary | ICD-10-CM

## 2022-01-06 DIAGNOSIS — F17210 Nicotine dependence, cigarettes, uncomplicated: Secondary | ICD-10-CM

## 2022-01-06 DIAGNOSIS — R0609 Other forms of dyspnea: Secondary | ICD-10-CM | POA: Insufficient documentation

## 2022-01-06 MED ORDER — FAMOTIDINE 20 MG PO TABS: 20.0000 mg | ORAL_TABLET | Freq: Two times a day (BID) | ORAL | 5 refills | Status: DC

## 2022-01-06 MED ORDER — TRELEGY ELLIPTA 100-62.5-25 MCG/ACT IN AEPB: 1.0000 | INHALATION_SPRAY | Freq: Every day | RESPIRATORY_TRACT | 5 refills | Status: DC

## 2022-01-06 NOTE — Assessment & Plan Note (Signed)
Smoking cessation discussed with with goal quit date of August 1st. Spent approximately 4 minutes discussing a plan. She does not want any pharmacological therapies right now. Will let us know if she changes her mind.

## 2022-01-06 NOTE — Assessment & Plan Note (Signed)
S/p SBRT. Surveillance CT scheduled for 7/7. Follow up with radiation oncology as scheduled.

## 2022-01-06 NOTE — Assessment & Plan Note (Signed)
Long term active smoker; concern for underlying COPD but has yet to undergo PFTs. She did have good benefit from Trelegy so we will continue it and schedule her for PFTs today.   Patient Instructions  Continue Trelegy 1 puff daily. Brush tongue and rinse mouth afterwards Continue Albuterol inhaler 2 puffs every 6 hours as needed for shortness of breath or wheezing. Notify if symptoms persist despite rescue inhaler/neb use.  Pepcid (famotidine) 20 mg prior to breakfast every morning and then 30 minutes before bedtime.   Pulmonary function testing scheduled for October.  Great job on cutting back smoking. Try to decrease by 1 cigarette a week until you have quit. Goal quit date: August 1st  Follow up with Dr. Valeta Harms after PFTs. If symptoms do not improve or worsen, please contact office for sooner follow up or seek emergency care.

## 2022-01-06 NOTE — Assessment & Plan Note (Signed)
Suspect her chronic cough is smoker's cough, which we discussed. She also has GERD symptoms so we will target these given this is a common cause of cough. Add H2 blocker on. No significant postnasal drainage or upper respiratory symptoms.

## 2022-01-06 NOTE — Progress Notes (Signed)
@Patient  ID: [name], female    DOB: 1961/04/22, 61 y.o.   MRN: [id number]  Chief Complaint  Patient presents with   Follow-up    Follow up. Patient states things are not better or worse.     Referring provider: Kathyrn Drown, MD  HPI: 61 year old female, current smoker followed for pulmonary nodule subsequently found to be neuroendocrine tumor.  She is a patient Dr. Juline Patch and last seen in office 09/12/2021.  Past medical history significant for hypertension, atherosclerosis, OSA, GERD, OSA, anxiety.  TEST/EVENTS:   09/12/2021: OV with Dr. Valeta Harms.  Diagnosed with carcinoid tumor status post SBRT with radiation oncology.  Doing ok from a respiratory standpoint; previously had PFTs ordered but have yet to be completed.  She has a 50+ pack year history and is still smoking approximately 1 pack a day; smoking cessation strongly advised with quit date set of July 4.  Provided with samples of Trelegy.  Plans to enroll her in lung cancer screening program after she completes her surveillance CT imaging in April 2024.  01/06/2022: Today-follow-up Patient presents today with caseworker for follow-up.  She reports that she has been doing relatively well.  She has not quit smoking yet but she has cut back to 5 cigarettes a day.  She was previously tried on Trelegy which she had feels like it has helped her breathing.  She does still occasionally get short of breath but not nearly as winded as she used to.  She does have a chronic, daily productive cough, which is worse in the morning.  She also has GERD symptoms and associated nausea, which is also worse in the morning.  She denies any hemoptysis, fevers, night sweats, anorexia, recent weight loss.  PFTs previously ordered were never scheduled.  Allergies  Allergen Reactions   Thorazine [Chlorpromazine Hcl] Anaphylaxis   Acetaminophen Other (See Comments)    Makes pt dizzy   Aspirin Other (See Comments)    seizure    Aspirin-Acetaminophen-Caffeine Other (See Comments)    seizure   Nsaids Nausea And Vomiting   Other     Acidic foods   Penicillins Nausea And Vomiting    Has patient had a PCN reaction causing immediate rash, facial/tongue/throat swelling, SOB or lightheadedness with hypotension:Yes Has patient had a PCN reaction causing severe rash involving mucus membranes or skin necrosis:Yes Has patient had a PCN reaction that required hospitalization:Yes Has patient had a PCN reaction occurring within the last 10 years:No If all of the above answers are "NO", then may proceed with Cephalosporin use.    Tomato Rash    Immunization History  Administered Date(s) Administered   Influenza Split 04/28/2013   Influenza,inj,Quad PF,6+ Mos 06/09/2015, 04/23/2017, 03/23/2019, 05/31/2020, 06/09/2021   Influenza-Unspecified 05/12/2014, 04/28/2016, 05/01/2018   Moderna Sars-Covid-2 Vaccination 09/08/2019, 10/08/2019   Pneumococcal Polysaccharide-23 03/03/2014   Zoster Recombinat (Shingrix) 05/18/2021    Past Medical History:  Diagnosis Date   Anxiety    Aortic atherosclerosis ([place]) 09/19/2020   Seen on CAT scan 2021   Arthritis    Depression    Diabetes mellitus    Diabetes mellitus, type II ([place])    Dyspnea    Dysrhythmia    GERD (gastroesophageal reflux disease)    Heart murmur    HTN (hypertension)    Hyperglycemia    IBS (irritable bowel syndrome)    Lung nodules    right, followed by PCP, PET 11/2011   Mental retardation    MI (myocardial infarction) ([place])  Migraines    Pneumonia    Sleep apnea    cpap    Stroke ([facility])     Tobacco History: Social History   Tobacco Use  Smoking Status Every Day   Packs/day: 0.50   Years: 40.00   Total pack years: 20.00   Types: Cigarettes  Smokeless Tobacco Never   Ready to quit: Not Answered Counseling given: Not Answered   Outpatient Medications Prior to Visit  Medication Sig Dispense Refill   atorvastatin (LIPITOR) 10 MG tablet Take  1 tablet (10 mg total) by mouth daily. 30 tablet 6   benztropine (COGENTIN) 1 MG tablet Take 1 tablet (1 mg total) by mouth daily. 30 tablet 5   cycloSPORINE (RESTASIS) 0.05 % ophthalmic emulsion Place 1 drop into both eyes 2 (two) times daily.     dicyclomine (BENTYL) 10 MG capsule Take 1 capsule (10 mg total) by mouth 4 (four) times daily -  before meals and at bedtime. 90 capsule 3   docusate sodium (COLACE) 100 MG capsule TAKE 1 CAPSULE BY MOUTH TWICE DAILY 60 capsule 1   hydrocortisone (ANUSOL-HC) 2.5 % rectal cream Place 1 application. rectally 2 (two) times daily. 30 g 1   hydrocortisone (ANUSOL-HC) 25 MG suppository Place 1 suppository (25 mg total) rectally 2 (two) times daily as needed for hemorrhoids or anal itching. 12 suppository 0   ondansetron (ZOFRAN) 4 MG tablet Take 1 tablet (4 mg total) by mouth every 6 (six) hours as needed for nausea. 20 tablet 0   paliperidone (INVEGA) 9 MG 24 hr tablet Take 1 tablet (9 mg total) by mouth daily. 30 tablet 5   potassium chloride (KLOR-CON) 10 MEQ tablet TAKE 2 TABLETS (20MEQ) BY MOUTH TWICE DAILY WITH FOOD 120 tablet 0   propranolol (INDERAL) 10 MG tablet TAKE 1 TABLET BY MOUTH THREE TIMES A DAY. 90 tablet 0   RABEprazole (ACIPHEX) 20 MG tablet TAKE 1 TABLET BY MOUTH TWICE A DAY. 180 tablet 3   senna (SENOKOT) 8.6 MG TABS tablet Take 2 tablets (17.2 mg total) by mouth daily. 60 tablet 0   sertraline (ZOLOFT) 100 MG tablet Take 1 tablet (100 mg total) by mouth daily. 30 tablet 5   torsemide (DEMADEX) 20 MG tablet TAKE (1) TABLET BY MOUTH EACH MORNING. 30 tablet 0   traZODone (DESYREL) 100 MG tablet TAKE 2 TABLETS(200MG ) BY MOUTH AT BEDTIME. 60 tablet 5   Fluticasone-Umeclidin-Vilant (TRELEGY ELLIPTA) 100-62.5-25 MCG/ACT AEPB Inhale 1 puff into the lungs daily. 60 each 0   Fluticasone-Umeclidin-Vilant (TRELEGY ELLIPTA) 100-62.5-25 MCG/ACT AEPB Inhale 1 puff into the lungs daily. 60 each 0   TRELEGY ELLIPTA 100-62.5-25 MCG/ACT AEPB INHALE (1) PUFF  INTO THE LUNGS ONCE DAILY. 60 each 6   polyethylene glycol-electrolytes (NULYTELY) 420 g solution As directed (Patient not taking: Reported on 12/09/2021) 4000 mL 0   No facility-administered medications prior to visit.     Review of Systems:   Constitutional: No weight loss or gain, night sweats, fevers, chills, fatigue, or lassitude. HEENT: No headaches, difficulty swallowing, tooth/dental problems, or sore throat. No sneezing, itching, ear ache, nasal congestion, or post nasal drip CV:  No chest pain, orthopnea, PND, swelling in lower extremities, anasarca, dizziness, palpitations, syncope Resp: +shortness of breath with exertion (improved); chronic productive cough. No excess mucus or change in color of mucus.No hemoptysis. No wheezing.  No chest wall deformity GI:  +heartburn, indigestion, nausea, chronic diarrhea (followed by GI).  No abdominal pain, change in bowel habits, loss of appetite,  bloody stools.  Skin: No rash, lesions, ulcerations Neuro: No dizziness or lightheadedness.  Psych: No depression or anxiety. Mood stable.     Physical Exam:  BP 124/68 (BP Location: Right Arm, Patient Position: Sitting, Cuff Size: Large)   Pulse 77   Temp 98.2 F (36.8 C) (Oral)   Ht 5\' 10"  (1.778 m)   Wt 243 lb 9.6 oz (110.5 kg)   SpO2 96%   BMI 34.95 kg/m   GEN: Pleasant, interactive, well-appearing; obese; in no acute distress. HEENT:  Normocephalic and atraumatic. PERRLA. Sclera white. Nasal turbinates pink, moist and patent bilaterally. No rhinorrhea present. Oropharynx pink and moist, without exudate or edema. No lesions, ulcerations, or postnasal drip.  NECK:  Supple w/ fair ROM. No JVD present.  CV: RRR, no m/r/g, no peripheral edema. Pulses intact, +2 bilaterally. No cyanosis, pallor or clubbing. PULMONARY:  Unlabored, regular breathing. Clear bilaterally A&P w/o wheezes/rales/rhonchi. No accessory muscle use. No dullness to percussion. GI: BS present and normoactive. Soft,  non-tender to palpation.  MSK: No erythema, warmth or tenderness. Cap refil <2 sec all extrem. No deformities or joint swelling noted.  Neuro: A/Ox3. No focal deficits noted.   Skin: Warm, no lesions or rashe Psych: Normal affect and behavior. Judgement and thought content appropriate.     Lab Results:  CBC    Component Value Date/Time   WBC 3.5 (L) 09/14/2021 1420   RBC 4.16 09/14/2021 1420   HGB 13.2 09/14/2021 1420   HGB 12.8 03/03/2021 1101   HCT 39.8 09/14/2021 1420   HCT 37.9 03/03/2021 1101   PLT 149 (L) 09/14/2021 1420   PLT 184 03/03/2021 1101   MCV 95.7 09/14/2021 1420   MCV 84 03/03/2021 1101   MCH 31.7 09/14/2021 1420   MCHC 33.2 09/14/2021 1420   RDW 13.3 09/14/2021 1420   RDW 14.6 03/03/2021 1101   LYMPHSABS 1.3 09/14/2021 1420   LYMPHSABS 1.6 03/03/2021 1101   MONOABS 0.6 09/14/2021 1420   EOSABS 0.0 09/14/2021 1420   EOSABS 0.0 03/03/2021 1101   BASOSABS 0.0 09/14/2021 1420   BASOSABS 0.0 03/03/2021 1101    BMET    Component Value Date/Time   NA 143 01/03/2022 1047   K 4.2 01/03/2022 1047   CL 103 01/03/2022 1047   CO2 26 01/03/2022 1047   GLUCOSE 97 01/03/2022 1047   GLUCOSE 95 09/14/2021 1420   BUN 6 01/03/2022 1047   CREATININE 0.84 01/03/2022 1047   CREATININE 0.84 05/01/2013 1044   CALCIUM 9.5 01/03/2022 1047   GFRNONAA >60 09/14/2021 1420   GFRAA 92 07/16/2020 1111    BNP No results found for: "BNP"   Imaging:  No results found.        No data to display          No results found for: "NITRICOXIDE"      Assessment & Plan:   DOE (dyspnea on exertion) Long term active smoker; concern for underlying COPD but has yet to undergo PFTs. She did have good benefit from Trelegy so we will continue it and schedule her for PFTs today.   Patient Instructions  Continue Trelegy 1 puff daily. Brush tongue and rinse mouth afterwards Continue Albuterol inhaler 2 puffs every 6 hours as needed for shortness of breath or wheezing.  Notify if symptoms persist despite rescue inhaler/neb use.  Pepcid (famotidine) 20 mg prior to breakfast every morning and then 30 minutes before bedtime.   Pulmonary function testing scheduled for October.  Great job on cutting  back smoking. Try to decrease by 1 cigarette a week until you have quit. Goal quit date: August 1st  Follow up with Dr. Valeta Harms after PFTs. If symptoms do not improve or worsen, please contact office for sooner follow up or seek emergency care.    Smoker Smoking cessation discussed with with goal quit date of August 1st. Spent approximately 4 minutes discussing a plan. She does not want any pharmacological therapies right now. Will let us know if she changes her mind.   Chronic cough Suspect her chronic cough is smoker's cough, which we discussed. She also has GERD symptoms so we will target these given this is a common cause of cough. Add H2 blocker on. No significant postnasal drainage or upper respiratory symptoms.   Carcinoid tumor of right lung S/p SBRT. Surveillance CT scheduled for 7/7. Follow up with radiation oncology as scheduled.  GASTROESOPHAGEAL REFLUX DISEASE, CHRONIC GERD symptoms with AM nausea and worsening in cough. Add on H2. Follow up with GI if remains poorly controlled.   I spent 32 minutes of dedicated to the care of this patient on the date of this encounter to include pre-visit review of records, face-to-face time with the patient discussing conditions above, post visit ordering of testing, clinical documentation with the electronic health record, making appropriate referrals as documented, and communicating necessary findings to members of the patients care team.  Clayton Bibles, NP 01/06/2022  Pt aware and understands NP's role.

## 2022-01-06 NOTE — Patient Instructions (Addendum)
Continue Trelegy 1 puff daily. Brush tongue and rinse mouth afterwards Continue Albuterol inhaler 2 puffs every 6 hours as needed for shortness of breath or wheezing. Notify if symptoms persist despite rescue inhaler/neb use.  Pepcid (famotidine) 20 mg prior to breakfast every morning and then 30 minutes before bedtime.   Pulmonary function testing scheduled for October.  Great job on cutting back smoking. Try to decrease by 1 cigarette a week until you have quit. Goal quit date: August 1st  Follow up with Dr. Valeta Harms after PFTs. If symptoms do not improve or worsen, please contact office for sooner follow up or seek emergency care.

## 2022-01-06 NOTE — Assessment & Plan Note (Signed)
GERD symptoms with AM nausea and worsening in cough. Add on H2. Follow up with GI if remains poorly controlled.

## 2022-01-13 ENCOUNTER — Ambulatory Visit (HOSPITAL_COMMUNITY)
Admission: RE | Admit: 2022-01-13 | Discharge: 2022-01-13 | Disposition: A | Discharge status: Home / Self Care | Payer: Medicare Other | Source: Ambulatory Visit | Attending: Radiation Oncology | Admitting: Radiation Oncology

## 2022-01-13 DIAGNOSIS — I700 Atherosclerosis of aorta: Secondary | ICD-10-CM | POA: Diagnosis not present

## 2022-01-13 DIAGNOSIS — C3490 Malignant neoplasm of unspecified part of unspecified bronchus or lung: Secondary | ICD-10-CM | POA: Diagnosis not present

## 2022-01-13 DIAGNOSIS — D3A090 Benign carcinoid tumor of the bronchus and lung: Secondary | ICD-10-CM | POA: Diagnosis not present

## 2022-01-13 DIAGNOSIS — R911 Solitary pulmonary nodule: Secondary | ICD-10-CM | POA: Diagnosis not present

## 2022-01-13 LAB — POCT I-STAT CREATININE: Creatinine, Ser: 0.8 mg/dL (ref 0.44–1.00)

## 2022-01-13 MED ORDER — IOHEXOL 300 MG/ML  SOLN
75.0000 mL | Freq: Once | INTRAMUSCULAR | Status: AC | PRN
  Administered 2022-01-13: 75 mL via INTRAVENOUS

## 2022-01-13 MED ORDER — SODIUM CHLORIDE (PF) 0.9 % IJ SOLN
INTRAMUSCULAR | Status: AC
  Filled 2022-01-13: qty 50

## 2022-01-16 ENCOUNTER — Ambulatory Visit
Admission: RE | Admit: 2022-01-16 | Discharge: 2022-01-16 | Disposition: A | Discharge status: Home / Self Care | Payer: Medicare Other | Source: Ambulatory Visit | Attending: Radiation Oncology | Admitting: Radiation Oncology

## 2022-01-16 ENCOUNTER — Encounter: Payer: Self-pay | Admitting: Radiation Oncology

## 2022-01-16 DIAGNOSIS — C342 Malignant neoplasm of middle lobe, bronchus or lung: Secondary | ICD-10-CM

## 2022-01-16 DIAGNOSIS — F17210 Nicotine dependence, cigarettes, uncomplicated: Secondary | ICD-10-CM | POA: Diagnosis not present

## 2022-01-16 DIAGNOSIS — D3A090 Benign carcinoid tumor of the bronchus and lung: Secondary | ICD-10-CM

## 2022-01-16 NOTE — Progress Notes (Signed)
Radiation Oncology         [phone] ________________________________   Outpatient Follow Up - Conducted via telephone at patient request.  I spoke with the patient's guardian to conduct this consult visit via telephone. The patient was notified in advance and was offered an in person or telemedicine meeting to allow for face to face communication but instead preferred to proceed with a telephone visit.  Name: [name]        MRN: [id number]  Date of Service: 01/16/2022 DOB: June 06, 1961  LS:[id number], Denise Snare, MD  Garner Nash, DO     REFERRING PHYSICIAN: Garner Nash, DO   DIAGNOSIS: The encounter diagnosis was Primary malignant neoplasm of right middle lobe of lung ([place]).   HISTORY OF PRESENT ILLNESS: [name] is a 61 y.o. female with a carcinoid tumor in the right lung.  The patient has been followed for many years with what was felt to be a benign nodule in the right lung.  She had a CT chest without contrast on 03/04/2021 that showed a Nodule in the right middle lobe measuring up to 2 cm in close proximity to the pulmonary artery branch.  She underwent a PET scan on 04/01/2021 that showed hypermetabolism with an SUV of 7.1 and this nodule, to rule out AVM in this location, she did also have a CT angiography scan of the chest on 05/26/2021 and there was no post contrast enhancement to suggest vascular anomaly.  She did undergo bronchoscopy on 06/14/2021 cytology from that procedure with Dr.Icard showed neoplastic cells in the fine-needle aspirate consistent with neuroendocrine tumor similar findings were seen in the brushing.  She was offered surgical resection but is interested in less invasive approach.    Rather, she went on to receive stereotactic body radiotherapy (SBRT) which she completed in February 2023.  She had a CT chest with contrast on 01/13/2022 that showed interval development of dense consolidative change along the right middle lobe with adjacent biopsy marking  clips and fiducials.  The nodule was no longer visualized in the vicinity but developing radiation change and fibrosis was seen.  There was a background of centrilobular nodularity and diffuse bilateral bronchial wall thickening likely secondary to smoking-related bronchiolitis, and evidence radiographically of atherosclerotic disease of the aorta and coronary vessels.  Her guardian [name] is contacted to review these results.    PREVIOUS RADIATION THERAPY:   08/01/21-08/16/21: The tumor in the RML was treated with a course of stereotactic body radiation treatment. The patient received 50 Gy In 5 fractions at 10 Gy per fraction.   PAST MEDICAL HISTORY:  Past Medical History:  Diagnosis Date   Anxiety    Aortic atherosclerosis ([place]) 09/19/2020   Seen on CAT scan 2021   Arthritis    Depression    Diabetes mellitus    Diabetes mellitus, type II (HCC)    Dyspnea    Dysrhythmia    GERD (gastroesophageal reflux disease)    Heart murmur    HTN (hypertension)    Hyperglycemia    IBS (irritable bowel syndrome)    Lung nodules    right, followed by PCP, PET 11/2011   Mental retardation    MI (myocardial infarction) ([place])    Migraines    Pneumonia    Sleep apnea    cpap    Stroke [place])        PAST SURGICAL HISTORY: Past Surgical History:  Procedure Laterality Date   ABDOMINAL HYSTERECTOMY  BIOPSY  01/15/2020   Procedure: BIOPSY;  Surgeon: Daneil Dolin, MD;  [facility];  Service: Endoscopy;;  gastric   BRONCHIAL BIOPSY  06/14/2021   Procedure: BRONCHIAL BIOPSIES;  Surgeon: Garner Nash, DO;  Location: [facility];  Service: Pulmonary;;   BRONCHIAL BRUSHINGS  06/14/2021   Procedure: BRONCHIAL BRUSHINGS;  Surgeon: Garner Nash, DO;  Location: [facility];  Service: Pulmonary;;   BRONCHIAL NEEDLE ASPIRATION BIOPSY  06/14/2021   Procedure: BRONCHIAL NEEDLE ASPIRATION BIOPSIES;  Surgeon: Garner Nash, DO;  Location: [facility];  Service: Pulmonary;;    COLONOSCOPY  11/2007   hyperplastic polyps, prior hx of adenomas    COLONOSCOPY  05/2010   incomplete due to poor prep, hyperplastic rectal polyp   COLONOSCOPY  05/05/2002   Dimunitive polyps in the rectum and left colon, cold    biopsied/removed.  Scattered few left-sided diverticula.  Regular colonic   mucosa appeared normal   COLONOSCOPY N/A 05/28/2013   Rourk: mulitple tubular adenomas removed. next tcs 05/2016   COLONOSCOPY WITH PROPOFOL N/A 09/07/2016   Dr. Gala Romney: For hyperplastic polyps removed. Next colonoscopy March 2023 given history of adenomatous colon polyps in the past.   ESOPHAGOGASTRODUODENOSCOPY  08/2007   moderate sized hiatal hernia   ESOPHAGOGASTRODUODENOSCOPY  05/2010   noncritical schatzki ring s/p 55F   ESOPHAGOGASTRODUODENOSCOPY (EGD) WITH ESOPHAGEAL DILATION N/A 02/06/2013   ZOX:WRUEAV esophagus-s/p dilation up to a 76 [place] size with [facility] dilators.  Hiatal hernia   ESOPHAGOGASTRODUODENOSCOPY (EGD) WITH PROPOFOL N/A 09/07/2016   Dr. Gala Romney: Normal, status post empiric dilation of the esophagus for history of dysphagia   ESOPHAGOGASTRODUODENOSCOPY (EGD) WITH PROPOFOL N/A 05/02/2019   Normal esophagus s/p dilation, normal stomach, normal duodenum   ESOPHAGOGASTRODUODENOSCOPY (EGD) WITH PROPOFOL N/A 01/15/2020   normal esophagus s/p dilation, gastric nodule s/p biopsy. This showed reactive gastropathy with H.pylori.    EXTERNAL EAR SURGERY     bilateral   FIDUCIAL MARKER PLACEMENT  06/14/2021   Procedure: FIDUCIAL MARKER PLACEMENT;  Surgeon: Garner Nash, DO;  Location: [facility];  Service: Pulmonary;;   FOOT SURGERY     GLAUCOMA SURGERY     KNEE ARTHROPLASTY Right 11/04/2020   Procedure: COMPUTER ASSISTED TOTAL KNEE ARTHROPLASTY;  Surgeon: Rod Can, MD;  Location: [facility];  Service: Orthopedics;  Laterality: Right;   MALONEY DILATION N/A 09/07/2016   Procedure: Venia Minks DILATION;  Surgeon: Daneil Dolin, MD;  [facility];  Service: Endoscopy;   Laterality: N/A;   MALONEY DILATION N/A 05/02/2019   Procedure: Venia Minks DILATION;  Surgeon: Daneil Dolin, MD;  [facility];  Service: Endoscopy;  Laterality: N/A;   MALONEY DILATION N/A 01/15/2020   Procedure: Venia Minks DILATION;  Surgeon: Daneil Dolin, MD;  [facility];  Service: Endoscopy;  Laterality: N/A;   POLYPECTOMY  09/07/2016   Procedure: POLYPECTOMY;  Surgeon: Daneil Dolin, MD;  [facility];  Service: Endoscopy;;  sigmoid colon x4   small bowel capsule  10/2007   normal   VIDEO BRONCHOSCOPY WITH RADIAL ENDOBRONCHIAL ULTRASOUND  06/14/2021   Procedure: VIDEO BRONCHOSCOPY WITH RADIAL ENDOBRONCHIAL ULTRASOUND;  Surgeon: Garner Nash, DO;  Location: [facility];  Service: Pulmonary;;     FAMILY HISTORY:  Family History  Problem Relation Age of Onset   Stroke Mother    Heart attack Father    Schizophrenia Other    Drug abuse Other    Alcohol abuse Other    Colon cancer Other  aunt   Obesity Other    COPD Other    Anxiety disorder Other    GER disease Other    Diabetes type II Other    Anxiety disorder Other    Depression Other    Depression Sister    Schizophrenia Sister    Liver disease Neg Hx    Inflammatory bowel disease Neg Hx      SOCIAL HISTORY:  reports that she has been smoking cigarettes. She has a 20.00 pack-year smoking history. She has never used smokeless tobacco. She reports that she does not drink alcohol and does not use drugs.The patient is divorced and lives independently but has a legal guardian.  She has an in-home caregiver who visits with her most days.   ALLERGIES: Thorazine [chlorpromazine hcl], Acetaminophen, Aspirin, Aspirin-acetaminophen-caffeine, Nsaids, Other, Penicillins, and Tomato   MEDICATIONS:  Current Outpatient Medications  Medication Sig Dispense Refill   atorvastatin (LIPITOR) 10 MG tablet Take 1 tablet (10 mg total) by mouth daily. 30 tablet 6   benztropine (COGENTIN) 1 MG tablet  Take 1 tablet (1 mg total) by mouth daily. 30 tablet 5   cycloSPORINE (RESTASIS) 0.05 % ophthalmic emulsion Place 1 drop into both eyes 2 (two) times daily.     dicyclomine (BENTYL) 10 MG capsule Take 1 capsule (10 mg total) by mouth 4 (four) times daily -  before meals and at bedtime. 90 capsule 3   docusate sodium (COLACE) 100 MG capsule TAKE 1 CAPSULE BY MOUTH TWICE DAILY 60 capsule 1   famotidine (PEPCID) 20 MG tablet Take 1 tablet (20 mg total) by mouth 2 (two) times daily. 60 tablet 5   Fluticasone-Umeclidin-Vilant (TRELEGY ELLIPTA) 100-62.5-25 MCG/ACT AEPB Inhale 1 puff into the lungs daily. 60 each 5   hydrocortisone (ANUSOL-HC) 2.5 % rectal cream Place 1 application. rectally 2 (two) times daily. 30 g 1   hydrocortisone (ANUSOL-HC) 25 MG suppository Place 1 suppository (25 mg total) rectally 2 (two) times daily as needed for hemorrhoids or anal itching. 12 suppository 0   ondansetron (ZOFRAN) 4 MG tablet Take 1 tablet (4 mg total) by mouth every 6 (six) hours as needed for nausea. 20 tablet 0   paliperidone (INVEGA) 9 MG 24 hr tablet Take 1 tablet (9 mg total) by mouth daily. 30 tablet 5   polyethylene glycol-electrolytes (NULYTELY) 420 g solution As directed (Patient not taking: Reported on 12/09/2021) 4000 mL 0   potassium chloride (KLOR-CON) 10 MEQ tablet TAKE 2 TABLETS (20MEQ) BY MOUTH TWICE DAILY WITH FOOD 120 tablet 0   propranolol (INDERAL) 10 MG tablet TAKE 1 TABLET BY MOUTH THREE TIMES A DAY. 90 tablet 0   RABEprazole (ACIPHEX) 20 MG tablet TAKE 1 TABLET BY MOUTH TWICE A DAY. 180 tablet 3   sertraline (ZOLOFT) 100 MG tablet Take 1 tablet (100 mg total) by mouth daily. 30 tablet 5   torsemide (DEMADEX) 20 MG tablet TAKE (1) TABLET BY MOUTH EACH MORNING. 30 tablet 0   traZODone (DESYREL) 100 MG tablet TAKE 2 TABLETS(200MG ) BY MOUTH AT BEDTIME. 60 tablet 5   No current facility-administered medications for this encounter.     REVIEW OF SYSTEMS: On review of systems, the patient is  not participating in the visit however her guardian states that she has been feeling well as best she can tell without any recent complaints of fevers progressive cough or productive cough though she does have occasionally a productive yellow cough.  No hemoptysis has been witnessed.  No other complaints are  verbalized.  PHYSICAL EXAM:  Unable to assess given encounter type  ECOG = 1  0 - Asymptomatic (Fully active, able to carry on all predisease activities without restriction)  1 - Symptomatic but completely ambulatory (Restricted in physically strenuous activity but ambulatory and able to carry out work of a light or sedentary nature. For example, light housework, office work)  2 - Symptomatic, <50% in bed during the day (Ambulatory and capable of all self care but unable to carry out any work activities. Up and about more than 50% of waking hours)  3 - Symptomatic, >50% in bed, but not bedbound (Capable of only limited self-care, confined to bed or chair 50% or more of waking hours)  4 - Bedbound (Completely disabled. Cannot carry on any self-care. Totally confined to bed or chair)  5 - Death   Eustace Pen MM, Creech RH, Tormey DC, et al. [phone]). "Toxicity and response criteria of the [facility]". [place] Oncol. 5 (6): 649-55    LABORATORY DATA:  Lab Results  Component Value Date   WBC 3.5 (L) 09/14/2021   HGB 13.2 09/14/2021   HCT 39.8 09/14/2021   MCV 95.7 09/14/2021   PLT 149 (L) 09/14/2021   Lab Results  Component Value Date   NA 143 01/03/2022   K 4.2 01/03/2022   CL 103 01/03/2022   CO2 26 01/03/2022   Lab Results  Component Value Date   ALT 17 01/03/2022   AST 27 01/03/2022   ALKPHOS 103 01/03/2022   BILITOT 0.3 01/03/2022      RADIOGRAPHY: CT CHEST W CONTRAST  Result Date: 01/15/2022 CLINICAL DATA:  Neuroendocrine cell lung cancer, monitoring, status post SBRT smoke * Tracking Code: BO * EXAM: CT CHEST WITH CONTRAST TECHNIQUE:  Multidetector CT imaging of the chest was performed during intravenous contrast administration. RADIATION DOSE REDUCTION: This exam was performed according to the departmental dose-optimization program which includes automated exposure control, adjustment of the mA and/or kV according to patient size and/or use of iterative reconstruction technique. CONTRAST:  62mL OMNIPAQUE IOHEXOL 300 MG/ML  SOLN COMPARISON:  PET-CT, 04/01/2021, CT chest, 05/26/2021 FINDINGS: Cardiovascular: Aortic atherosclerosis. Normal heart size. Left coronary artery calcifications. No pericardial effusion. Mediastinum/Nodes: No enlarged mediastinal, hilar, or axillary lymph nodes. Thyroid gland, trachea, and esophagus demonstrate no significant findings. Lungs/Pleura: Background of very fine centrilobular nodularity, most concentrated at the lung apices. Diffuse bilateral bronchial wall thickening. Interval development dense consolidation about a previously seen nodule of the medial segment right middle lobe, now with adjacent biopsy marking clips and fiducials. Nodule is no longer discretely visualized in this vicinity (series 8, image 90). No pleural effusion or pneumothorax. Upper Abdomen: No acute abnormality. Musculoskeletal: No chest wall abnormality. No suspicious osseous lesions identified. IMPRESSION: 1. Interval development of dense consolidation about a previously seen nodule of the medial segment right middle lobe, now with adjacent biopsy marking clips and fiducials. Nodule is no longer discretely visualized in this vicinity. Findings are consistent with developing radiation pneumonitis and fibrosis. Attention on follow-up. 2. Background of very fine centrilobular nodularity and diffuse bilateral bronchial wall thickening, most concentrated at the lung apices, consistent with smoking-related respiratory bronchiolitis. 3. Coronary artery disease. Aortic Atherosclerosis (ICD10-I70.0). Electronically Signed   By: Delanna Ahmadi M.D.    On: 01/15/2022 20:46       IMPRESSION/PLAN: 1. Carcinoid tumor in the right middle lobe.  The patient is counseled on findings and pathology.  Dr. Lisbeth Renshaw discusses the rationale for treatment of  this tumor, she does not have systemic symptoms of neuroendocrine disease fortunately.  We discussed stereotactic body radiotherapy  (SBRT) as well as the delivery and logistics of treatment, the side effects short and long term effects as well as into the anticipated findings after treatment.  Dr. Lisbeth Renshaw anticipates 5 fractions due to the location of her nodule.  We discussed the surveillance schedule of CT scans at 14-month intervals for 5 years similar to non-small cell lung cancers.  She is in agreement with this plan.  Her guardian is also in agreement.  We will contact her for simulation to coordinate planning for radiotherapy in the coming weeks.Written consent is obtained and placed in the chart, a copy was provided to the patient.    This encounter was conducted via telephone.  The patient has provided two factor identification and has given verbal consent for this type of encounter and has been advised to only accept a meeting of this type in a secure network environment. The time spent during this encounter was 35 minutes including preparation, discussion, and coordination of the patient's care. The attendants for this meeting include [name] and [name]. During the encounter,  [name] was located at [facility] Radiation Oncology Department.  [name] was located at home and not a part of the discussion but her Guardian and Case Manager [name] was at work.      Carola Rhine, [facility]   **Disclaimer: This note was dictated with voice recognition software. Similar sounding words can inadvertently be transcribed and this note may contain transcription errors which may not have been corrected upon publication of note.**

## 2022-01-16 NOTE — Progress Notes (Signed)
Telephone appointment I spoke w/ patient's caregiver Ms. Foye Spurling, verified her identity and began nursing interview. No issues reported at this time.  Meaningful use complete.  Reminded Ms. [name] of patient's 3:00pm-01/16/22 telephone appointment w/ Shona Simpson PA-C. I left my extension [phone] in case patient needs anything. Ms. [name] verbalized understanding.  Patient contact [phone] or [phone]

## 2022-01-17 ENCOUNTER — Other Ambulatory Visit: Payer: Self-pay | Admitting: Family Medicine

## 2022-01-17 ENCOUNTER — Other Ambulatory Visit: Payer: Self-pay | Admitting: Internal Medicine

## 2022-01-20 NOTE — Patient Instructions (Signed)
[name]  01/20/2022     @PREFPERIOPPHARMACY @   Your procedure is scheduled on  01/25/2022.   Report to [name] at  [name].M.   Call this number if you have problems the morning of surgery:  [phone]   Remember:  Follow the diet and prep instructions given to you by the office.     Use your inhaler before you come and bring your rescue inhaler with you.     Take these medicines the morning of surgery with A SIP OF WATER        invega, pepcid, zofran (if needed), aciphex, zoloft, cogentin, propranolol.     Do not wear jewelry, make-up or nail polish.  Do not wear lotions, powders, or perfumes, or deodorant.  Do not shave 48 hours prior to surgery.  Men may shave face and neck.  Do not bring valuables to the hospital.  [name] is not responsible for any belongings or valuables.  Contacts, dentures or bridgework may not be worn into surgery.  Leave your suitcase in the car.  After surgery it may be brought to your room.  For patients admitted to the hospital, discharge time will be determined by your treatment team.  Patients discharged the day of surgery will not be allowed to drive home and must have someone with them for 24 hours.    Special instructions:   DO NOT smoke tobacco or vape for 24 hours before your procedure.  Please read over the following fact sheets that you were given. Anesthesia Post-op Instructions and Care and Recovery After Surgery      Colonoscopy, Adult, Care After The following information offers guidance on how to care for yourself after your procedure. Your health care provider may also give you more specific instructions. If you have problems or questions, contact your health care provider. What can I expect after the procedure? After the procedure, it is common to have: A small amount of blood in your stool for 24 hours after the procedure. Some gas. Mild cramping or bloating of your abdomen. Follow these  instructions at home: Eating and drinking  Drink enough fluid to keep your urine pale yellow. Follow instructions from your health care provider about eating or drinking restrictions. Resume your normal diet as told by your health care provider. Avoid heavy or fried foods that are hard to digest. Activity Rest as told by your health care provider. Avoid sitting for a long time without moving. Get up to take short walks every 1-2 hours. This is important to improve blood flow and breathing. Ask for help if you feel weak or unsteady. Return to your normal activities as told by your health care provider. Ask your health care provider what activities are safe for you. Managing cramping and bloating  Try walking around when you have cramps or feel bloated. If directed, apply heat to your abdomen as told by your health care provider. Use the heat source that your health care provider recommends, such as a moist heat pack or a heating pad. Place a towel between your skin and the heat source. Leave the heat on for 20-30 minutes. Remove the heat if your skin turns bright red. This is especially important if you are unable to feel pain, heat, or cold. You have a greater risk of getting burned. General instructions If you were given a sedative during the procedure, it can affect you for several hours. Do not drive  or operate machinery until your health care provider says that it is safe. For the first 24 hours after the procedure: Do not sign important documents. Do not drink alcohol. Do your regular daily activities at a slower pace than normal. Eat soft foods that are easy to digest. Take over-the-counter and prescription medicines only as told by your health care provider. Keep all follow-up visits. This is important. Contact a health care provider if: You have blood in your stool 2-3 days after the procedure. Get help right away if: You have more than a small spotting of blood in your  stool. You have large blood clots in your stool. You have swelling of your abdomen. You have nausea or vomiting. You have a fever. You have increasing pain in your abdomen that is not relieved with medicine. These symptoms may be an emergency. Get help right away. Call 911. Do not wait to see if the symptoms will go away. Do not drive yourself to the hospital. Summary After the procedure, it is common to have a small amount of blood in your stool. You may also have mild cramping and bloating of your abdomen. If you were given a sedative during the procedure, it can affect you for several hours. Do not drive or operate machinery until your health care provider says that it is safe. Get help right away if you have a lot of blood in your stool, nausea or vomiting, a fever, or increased pain in your abdomen. This information is not intended to replace advice given to you by your health care provider. Make sure you discuss any questions you have with your health care provider. Document Revised: 02/16/2021 Document Reviewed: 02/16/2021 Elsevier Patient Education  [name] After This sheet gives you information about how to care for yourself after your procedure. Your health care provider may also give you more specific instructions. If you have problems or questions, contact your health care provider. What can I expect after the procedure? After the procedure, it is common to have: Tiredness. Forgetfulness about what happened after the procedure. Impaired judgment for important decisions. Nausea or vomiting. Some difficulty with balance. Follow these instructions at home: For the time period you were told by your health care provider:     Rest as needed. Do not participate in activities where you could fall or become injured. Do not drive or use machinery. Do not drink alcohol. Do not take sleeping pills or medicines that cause drowsiness. Do  not make important decisions or sign legal documents. Do not take care of children on your own. Eating and drinking Follow the diet that is recommended by your health care provider. Drink enough fluid to keep your urine pale yellow. If you vomit: Drink water, juice, or soup when you can drink without vomiting. Make sure you have little or no nausea before eating solid foods. General instructions Have a responsible adult stay with you for the time you are told. It is important to have someone help care for you until you are awake and alert. Take over-the-counter and prescription medicines only as told by your health care provider. If you have sleep apnea, surgery and certain medicines can increase your risk for breathing problems. Follow instructions from your health care provider about wearing your sleep device: Anytime you are sleeping, including during daytime naps. While taking prescription pain medicines, sleeping medicines, or medicines that make you drowsy. Avoid smoking. Keep all follow-up visits as told by your  health care provider. This is important. Contact a health care provider if: You keep feeling nauseous or you keep vomiting. You feel light-headed. You are still sleepy or having trouble with balance after 24 hours. You develop a rash. You have a fever. You have redness or swelling around the IV site. Get help right away if: You have trouble breathing. You have new-onset confusion at home. Summary For several hours after your procedure, you may feel tired. You may also be forgetful and have poor judgment. Have a responsible adult stay with you for the time you are told. It is important to have someone help care for you until you are awake and alert. Rest as told. Do not drive or operate machinery. Do not drink alcohol or take sleeping pills. Get help right away if you have trouble breathing, or if you suddenly become confused. This information is not intended to replace  advice given to you by your health care provider. Make sure you discuss any questions you have with your health care provider. Document Revised: 05/31/2021 Document Reviewed: 05/29/2019 Elsevier Patient Education  [place].

## 2022-01-23 ENCOUNTER — Other Ambulatory Visit: Payer: Self-pay | Admitting: Family Medicine

## 2022-01-23 ENCOUNTER — Encounter (HOSPITAL_COMMUNITY)
Admission: RE | Admit: 2022-01-23 | Discharge: 2022-01-23 | Disposition: A | Discharge status: Home / Self Care | Payer: Medicare Other | Source: Ambulatory Visit | Attending: Internal Medicine | Admitting: Internal Medicine

## 2022-01-23 VITALS — BP 134/83 | HR 80 | Temp 98.2°F | Resp 18 | Ht 70.0 in | Wt 243.6 lb

## 2022-01-23 DIAGNOSIS — I10 Essential (primary) hypertension: Secondary | ICD-10-CM | POA: Diagnosis not present

## 2022-01-23 DIAGNOSIS — Z01810 Encounter for preprocedural cardiovascular examination: Secondary | ICD-10-CM | POA: Insufficient documentation

## 2022-01-25 ENCOUNTER — Ambulatory Visit (HOSPITAL_COMMUNITY): Payer: Medicare Other | Admitting: Anesthesiology

## 2022-01-25 ENCOUNTER — Ambulatory Visit (HOSPITAL_COMMUNITY)
Admission: RE | Admit: 2022-01-25 | Discharge: 2022-01-25 | Disposition: A | Discharge status: Home / Self Care | Payer: Medicare Other | Attending: Internal Medicine | Admitting: Internal Medicine

## 2022-01-25 ENCOUNTER — Encounter (HOSPITAL_COMMUNITY)
Admission: RE | Disposition: A | Discharge status: Home / Self Care | Payer: Self-pay | Source: Home / Self Care | Attending: Internal Medicine

## 2022-01-25 ENCOUNTER — Ambulatory Visit (HOSPITAL_BASED_OUTPATIENT_CLINIC_OR_DEPARTMENT_OTHER): Payer: Medicare Other | Admitting: Anesthesiology

## 2022-01-25 ENCOUNTER — Other Ambulatory Visit: Payer: Self-pay

## 2022-01-25 ENCOUNTER — Ambulatory Visit: Payer: Medicare Other | Admitting: Podiatry

## 2022-01-25 ENCOUNTER — Encounter (HOSPITAL_COMMUNITY): Payer: Self-pay | Admitting: Internal Medicine

## 2022-01-25 DIAGNOSIS — F418 Other specified anxiety disorders: Secondary | ICD-10-CM | POA: Diagnosis not present

## 2022-01-25 DIAGNOSIS — K6389 Other specified diseases of intestine: Secondary | ICD-10-CM | POA: Insufficient documentation

## 2022-01-25 DIAGNOSIS — I700 Atherosclerosis of aorta: Secondary | ICD-10-CM | POA: Diagnosis not present

## 2022-01-25 DIAGNOSIS — I252 Old myocardial infarction: Secondary | ICD-10-CM | POA: Diagnosis not present

## 2022-01-25 DIAGNOSIS — M1990 Unspecified osteoarthritis, unspecified site: Secondary | ICD-10-CM | POA: Insufficient documentation

## 2022-01-25 DIAGNOSIS — G4733 Obstructive sleep apnea (adult) (pediatric): Secondary | ICD-10-CM

## 2022-01-25 DIAGNOSIS — F17210 Nicotine dependence, cigarettes, uncomplicated: Secondary | ICD-10-CM | POA: Diagnosis not present

## 2022-01-25 DIAGNOSIS — D123 Benign neoplasm of transverse colon: Secondary | ICD-10-CM

## 2022-01-25 DIAGNOSIS — Z7984 Long term (current) use of oral hypoglycemic drugs: Secondary | ICD-10-CM | POA: Diagnosis not present

## 2022-01-25 DIAGNOSIS — K529 Noninfective gastroenteritis and colitis, unspecified: Secondary | ICD-10-CM | POA: Diagnosis not present

## 2022-01-25 DIAGNOSIS — D125 Benign neoplasm of sigmoid colon: Secondary | ICD-10-CM | POA: Diagnosis not present

## 2022-01-25 DIAGNOSIS — E119 Type 2 diabetes mellitus without complications: Secondary | ICD-10-CM | POA: Diagnosis not present

## 2022-01-25 DIAGNOSIS — Z860100 Personal history of colon polyps, unspecified: Secondary | ICD-10-CM

## 2022-01-25 DIAGNOSIS — Z9889 Other specified postprocedural states: Secondary | ICD-10-CM | POA: Diagnosis not present

## 2022-01-25 DIAGNOSIS — D124 Benign neoplasm of descending colon: Secondary | ICD-10-CM | POA: Diagnosis not present

## 2022-01-25 DIAGNOSIS — I10 Essential (primary) hypertension: Secondary | ICD-10-CM | POA: Insufficient documentation

## 2022-01-25 DIAGNOSIS — K635 Polyp of colon: Secondary | ICD-10-CM

## 2022-01-25 DIAGNOSIS — K5730 Diverticulosis of large intestine without perforation or abscess without bleeding: Secondary | ICD-10-CM

## 2022-01-25 DIAGNOSIS — K219 Gastro-esophageal reflux disease without esophagitis: Secondary | ICD-10-CM | POA: Insufficient documentation

## 2022-01-25 DIAGNOSIS — R197 Diarrhea, unspecified: Secondary | ICD-10-CM | POA: Diagnosis not present

## 2022-01-25 HISTORY — PX: BIOPSY: SHX5522

## 2022-01-25 HISTORY — PX: COLONOSCOPY WITH PROPOFOL: SHX5780

## 2022-01-25 HISTORY — PX: POLYPECTOMY: SHX5525

## 2022-01-25 LAB — C DIFFICILE QUICK SCREEN W PCR REFLEX
C Diff antigen: NEGATIVE
C Diff interpretation: NOT DETECTED
C Diff toxin: NEGATIVE

## 2022-01-25 LAB — GLUCOSE, CAPILLARY: Glucose-Capillary: 117 mg/dL — ABNORMAL HIGH (ref 70–99)

## 2022-01-25 SURGERY — COLONOSCOPY WITH PROPOFOL
Anesthesia: General

## 2022-01-25 MED ORDER — PROPOFOL 500 MG/50ML IV EMUL
INTRAVENOUS | Status: DC | PRN
  Administered 2022-01-25: 100 ug/kg/min via INTRAVENOUS

## 2022-01-25 MED ORDER — PROPOFOL 10 MG/ML IV BOLUS
INTRAVENOUS | Status: DC | PRN
  Administered 2022-01-25: 50 mg via INTRAVENOUS

## 2022-01-25 MED ORDER — LACTATED RINGERS IV SOLN: INTRAVENOUS | Status: DC

## 2022-01-25 MED ORDER — PROPOFOL 500 MG/50ML IV EMUL
INTRAVENOUS | Status: AC
  Filled 2022-01-25: qty 50

## 2022-01-25 NOTE — Transfer of Care (Signed)
Immediate Anesthesia Transfer of Care Note  Patient: [name]  Procedure(s) Performed: COLONOSCOPY WITH PROPOFOL  Patient Location: PACU  Anesthesia Type:General  Level of Consciousness: awake, alert  and oriented  Airway & Oxygen Therapy: Patient Spontanous Breathing  Post-op Assessment: Report given to RN, Post -op Vital signs reviewed and stable, Patient moving all extremities X 4 and Patient able to stick tongue midline  Post vital signs: Reviewed  Last Vitals:  Vitals Value Taken Time  BP 103/81 01/25/22 1215  Temp 36.6 C 01/25/22 1215  Pulse 80 01/25/22 1215  Resp 13 01/25/22 1215  SpO2 100 % 01/25/22 1215    Last Pain:  Vitals:   01/25/22 1215  TempSrc: Oral  PainSc: 0-No pain         Complications: No notable events documented.

## 2022-01-25 NOTE — Anesthesia Preprocedure Evaluation (Signed)
Anesthesia Evaluation  Patient identified by MRN, date of birth, ID band Patient awake    Reviewed: Allergy & Precautions, NPO status , Patient's Chart, lab work & pertinent test results  Airway Mallampati: II  TM Distance: >3 FB Neck ROM: Full    Dental  (+) Edentulous Upper, Edentulous Lower   Pulmonary shortness of breath, sleep apnea , pneumonia, Current Smoker and Patient abstained from smoking.,    Pulmonary exam normal breath sounds clear to auscultation       Cardiovascular Exercise Tolerance: Poor hypertension, Pt. on medications + Past MI and + DOE  Normal cardiovascular exam+ dysrhythmias + Valvular Problems/Murmurs  Rhythm:Regular Rate:Normal     Neuro/Psych  Headaches, PSYCHIATRIC DISORDERS Anxiety Depression CVA    GI/Hepatic Neg liver ROS, GERD  Medicated,  Endo/Other  diabetes, Well Controlled, Type 2, Oral Hypoglycemic Agents  Renal/GU negative Renal ROS  negative genitourinary   Musculoskeletal  (+) Arthritis ,   Abdominal   Peds negative pediatric ROS (+)  Hematology negative hematology ROS (+)   Anesthesia Other Findings IMPRESSION: 1. Interval development of dense consolidation about a previously seen nodule of the medial segment right middle lobe, now with adjacent biopsy marking clips and fiducials. Nodule is no longer discretely visualized in this vicinity. Findings are consistent with developing radiation pneumonitis and fibrosis. Attention on follow-up. 2. Background of very fine centrilobular nodularity and diffuse bilateral bronchial wall thickening, most concentrated at the lung apices, consistent with smoking-related respiratory bronchiolitis. 3. Coronary artery disease.  Aortic Atherosclerosis (ICD10-I70.0).   Electronically Signed   By: Delanna Ahmadi M.D.   On: 01/15/2022 20:46  Reproductive/Obstetrics negative OB ROS                            Anesthesia Physical Anesthesia Plan  ASA: 3  Anesthesia Plan: General   Post-op Pain Management: Minimal or no pain anticipated   Induction: Intravenous  PONV Risk Score and Plan: Propofol infusion  Airway Management Planned: Nasal Cannula and Natural Airway  Additional Equipment:   Intra-op Plan:   Post-operative Plan:   Informed Consent: I have reviewed the patients History and Physical, chart, labs and discussed the procedure including the risks, benefits and alternatives for the proposed anesthesia with the patient or authorized representative who has indicated his/her understanding and acceptance.       Plan Discussed with: CRNA and Surgeon  Anesthesia Plan Comments:         Anesthesia Quick Evaluation

## 2022-01-25 NOTE — Op Note (Signed)
[facility] Patient Name: [name] Procedure Date: 01/25/2022 11:12 AM MRN: [id number] Date of Birth: 1960-10-26 Attending MD: Norvel Richards , MD CSN: [id number] Age: 61 Admit Type: Outpatient Procedure:                Colonoscopy Indications:              Chronic diarrhea Providers:                Norvel Richards, MD, Lurline Del, RN, Everardo Pacific, Aram Candela Referring MD:              Medicines:                Propofol per Anesthesia Complications:            No immediate complications. Estimated Blood Loss:     Estimated blood loss was minimal. Procedure:                Pre-Anesthesia Assessment:                           - Prior to the procedure, a History and Physical                            was performed, and patient medications and                            allergies were reviewed. The patient's tolerance of                            previous anesthesia was also reviewed. The risks                            and benefits of the procedure and the sedation                            options and risks were discussed with the patient.                            All questions were answered, and informed consent                            was obtained. Prior Anticoagulants: The patient has                            taken no previous anticoagulant or antiplatelet                            agents. ASA Grade Assessment: III - A patient with                            severe systemic disease. After reviewing the risks  and benefits, the patient was deemed in                            satisfactory condition to undergo the procedure.                           After obtaining informed consent, the colonoscope                            was passed under direct vision. Throughout the                            procedure, the patient's blood pressure, pulse, and                            oxygen saturations  were monitored continuously. The                            [id number]) scope was introduced through                            the anus and advanced to the the cecum, identified                            by appendiceal orifice and ileocecal valve. The                            patient tolerated the procedure well. The quality                            of the bowel preparation was adequate. Scope In: 11:32:34 AM Scope Out: 12:07:05 PM Scope Withdrawal Time: 0 hours 21 minutes 50 seconds  Total Procedure Duration: 0 hours 34 minutes 31 seconds  Findings:      The perianal and digital rectal examinations were normal. Pigmented       colonic mucosa throughout the colon left-sided diverticula. Redundant       elongated colon. Position change and external abdominal pressure       required to reach the cecum.      Biopsies of the right left colon taken to evaluate for chronic diarrhea.       1 cm polyp in hepatic flexure hot snare removed. 8 polyps in the       descending/sigmoid segment -status post multiple cold snare       polypectomies. Multiple yellowish soft submucosal lesions in the       ascending segment (4) largest 1.5 cm. Positive pillow sign.      Stool for GIP and C. difficile submitted Impression:               - Redundant/elongated colon. Melanosis coli.                            Left-sided diverticula. Multiple colonic polyps                            removed as described above. Colonic lipoma.  Moderate Sedation:      Moderate (conscious) sedation was personally administered by an       anesthesia professional. The following parameters were monitored: oxygen       saturation, heart rate, blood pressure, respiratory rate, EKG, adequacy       of pulmonary ventilation, and response to care. Recommendation:           - Patient has a contact number available for                            emergencies. The signs and symptoms of potential                             delayed complications were discussed with the                            patient. Return to normal activities tomorrow.                            Written discharge instructions were provided to the                            patient.                           - Advance diet as tolerated. Follow-up on                            pathology. Follow-up on stool studies. Further                            recommendations to follow. Procedure Code(s):        --- Professional ---                           [id number], Colonoscopy, flexible; diagnostic, including                            collection of specimen(s) by brushing or washing,                            when performed (separate procedure) Diagnosis Code(s):        --- Professional ---                           K52.9, Noninfective gastroenteritis and colitis,                            unspecified CPT copyright 2019 American Medical Association. All rights reserved. The codes documented in this report are preliminary and upon coder review may  be revised to meet current compliance requirements. [name]. Deonna Krummel, MD Norvel Richards, MD 01/25/2022 12:18:54 PM This report has been signed electronically. Number of Addenda: 0

## 2022-01-25 NOTE — Discharge Instructions (Signed)
  Colonoscopy Discharge Instructions  Read the instructions outlined below and refer to this sheet in the next few weeks. These discharge instructions provide you with general information on caring for yourself after you leave the hospital. Your doctor may also give you specific instructions. While your treatment has been planned according to the most current medical practices available, unavoidable complications occasionally occur. If you have any problems or questions after discharge, call Dr. Gala Romney at [phone]. ACTIVITY You may resume your regular activity, but move at a slower pace for the next 24 hours.  Take frequent rest periods for the next 24 hours.  Walking will help get rid of the air and reduce the bloated feeling in your belly (abdomen).  No driving for 24 hours (because of the medicine (anesthesia) used during the test).   Do not sign any important legal documents or operate any machinery for 24 hours (because of the anesthesia used during the test).  NUTRITION Drink plenty of fluids.  You may resume your normal diet as instructed by your doctor.  Begin with a light meal and progress to your normal diet. Heavy or fried foods are harder to digest and may make you feel sick to your stomach (nauseated).  Avoid alcoholic beverages for 24 hours or as instructed.  MEDICATIONS You may resume your normal medications unless your doctor tells you otherwise.  WHAT YOU CAN EXPECT TODAY Some feelings of bloating in the abdomen.  Passage of more gas than usual.  Spotting of blood in your stool or on the toilet paper.  IF YOU HAD POLYPS REMOVED DURING THE COLONOSCOPY: No aspirin products for 7 days or as instructed.  No alcohol for 7 days or as instructed.  Eat a soft diet for the next 24 hours.  FINDING OUT THE RESULTS OF YOUR TEST Not all test results are available during your visit. If your test results are not back during the visit, make an appointment with your caregiver to find out the  results. Do not assume everything is normal if you have not heard from your caregiver or the medical facility. It is important for you to follow up on all of your test results.  SEEK IMMEDIATE MEDICAL ATTENTION IF: You have more than a spotting of blood in your stool.  Your belly is swollen (abdominal distention).  You are nauseated or vomiting.  You have a temperature over 101.  You have abdominal pain or discomfort that is severe or gets worse throughout the day.    Multiple polyps removed today.  Biopsies taken.  Diverticulosis and colon polyp information provided   At patient request, I called [name] at [phone] -discussed findings and recommendations  further recommendations to follow pending review of pathology report

## 2022-01-25 NOTE — Anesthesia Postprocedure Evaluation (Signed)
Anesthesia Post Note  Patient: [name]  Procedure(s) Performed: COLONOSCOPY WITH PROPOFOL POLYPECTOMY BIOPSY  Patient location during evaluation: Phase II Anesthesia Type: General Level of consciousness: awake and alert and oriented Pain management: pain level controlled Vital Signs Assessment: post-procedure vital signs reviewed and stable Respiratory status: spontaneous breathing, nonlabored ventilation and respiratory function stable Cardiovascular status: blood pressure returned to baseline and stable Postop Assessment: no apparent nausea or vomiting Anesthetic complications: no   No notable events documented.   Last Vitals:  Vitals:   01/25/22 1032 01/25/22 1215  BP: 138/79 103/81  Pulse: 77 80  Resp: 16 13  Temp: 36.7 C 36.6 C  SpO2: 99% 100%    Last Pain:  Vitals:   01/25/22 1215  TempSrc: Oral  PainSc: 0-No pain                 Clairissa Valvano C Antrell Tipler

## 2022-01-25 NOTE — H&P (Signed)
@LOGO @   Primary Care Physician:  Kathyrn Drown, MD Primary Gastroenterologist:  Dr. Gala Romney  Pre-Procedure History & Physical: HPI:  [name] is a 61 y.o. female here for  for evaluate chronic diarrhea.  Distant history of colonic adenoma.  Past Medical History:  Diagnosis Date   Anxiety    Aortic atherosclerosis ([place]) 09/19/2020   Seen on CAT scan 2021   Arthritis    Depression    Diabetes mellitus    Diabetes mellitus, type II ([place])    Dyspnea    Dysrhythmia    GERD (gastroesophageal reflux disease)    Heart murmur    HTN (hypertension)    Hyperglycemia    IBS (irritable bowel syndrome)    Lung nodules    right, followed by PCP, PET 11/2011   Mental retardation    MI (myocardial infarction) ([place])    Migraines    Pneumonia    Sleep apnea    cpap    Stroke [facility])     Past Surgical History:  Procedure Laterality Date   ABDOMINAL HYSTERECTOMY     BIOPSY  01/15/2020   Procedure: BIOPSY;  Surgeon: Daneil Dolin, MD;  [facility];  Service: Endoscopy;;  gastric   BRONCHIAL BIOPSY  06/14/2021   Procedure: BRONCHIAL BIOPSIES;  Surgeon: Garner Nash, DO;  Location: [place] [facility];  Service: Pulmonary;;   BRONCHIAL BRUSHINGS  06/14/2021   Procedure: BRONCHIAL BRUSHINGS;  Surgeon: Garner Nash, DO;  Location: [place] [facility];  Service: Pulmonary;;   BRONCHIAL NEEDLE ASPIRATION BIOPSY  06/14/2021   Procedure: BRONCHIAL NEEDLE ASPIRATION BIOPSIES;  Surgeon: Garner Nash, DO;  Location: [place] [facility];  Service: Pulmonary;;   COLONOSCOPY  11/2007   hyperplastic polyps, prior hx of adenomas    COLONOSCOPY  05/2010   incomplete due to poor prep, hyperplastic rectal polyp   COLONOSCOPY  05/05/2002   Dimunitive polyps in the rectum and left colon, cold    biopsied/removed.  Scattered few left-sided diverticula.  Regular colonic   mucosa appeared normal   COLONOSCOPY N/A 05/28/2013   Gesselle Fitzsimons: mulitple tubular adenomas removed. next tcs 05/2016   COLONOSCOPY WITH  PROPOFOL N/A 09/07/2016   Dr. Gala Romney: For hyperplastic polyps removed. Next colonoscopy March 2023 given history of adenomatous colon polyps in the past.   ESOPHAGOGASTRODUODENOSCOPY  08/2007   moderate sized hiatal hernia   ESOPHAGOGASTRODUODENOSCOPY  05/2010   noncritical schatzki ring s/p 35F   ESOPHAGOGASTRODUODENOSCOPY (EGD) WITH ESOPHAGEAL DILATION N/A 02/06/2013   MVH:QIONGE esophagus-s/p dilation up to a 72 [place] size with [employer] dilators.  Hiatal hernia   ESOPHAGOGASTRODUODENOSCOPY (EGD) WITH PROPOFOL N/A 09/07/2016   Dr. Gala Romney: Normal, status post empiric dilation of the esophagus for history of dysphagia   ESOPHAGOGASTRODUODENOSCOPY (EGD) WITH PROPOFOL N/A 05/02/2019   Normal esophagus s/p dilation, normal stomach, normal duodenum   ESOPHAGOGASTRODUODENOSCOPY (EGD) WITH PROPOFOL N/A 01/15/2020   normal esophagus s/p dilation, gastric nodule s/p biopsy. This showed reactive gastropathy with H.pylori.    EXTERNAL EAR SURGERY     bilateral   FIDUCIAL MARKER PLACEMENT  06/14/2021   Procedure: FIDUCIAL MARKER PLACEMENT;  Surgeon: Garner Nash, DO;  Location: [place] [facility];  Service: Pulmonary;;   FOOT SURGERY     GLAUCOMA SURGERY     KNEE ARTHROPLASTY Right 11/04/2020   Procedure: COMPUTER ASSISTED TOTAL KNEE ARTHROPLASTY;  Surgeon: Rod Can, MD;  Location: [facility];  Service: Orthopedics;  Laterality: Right;   MALONEY DILATION N/A 09/07/2016   Procedure: MALONEY DILATION;  Surgeon: Daneil Dolin, MD;  [facility];  Service: Endoscopy;  Laterality: N/A;   MALONEY DILATION N/A 05/02/2019   Procedure: Venia Minks DILATION;  Surgeon: Daneil Dolin, MD;  [facility];  Service: Endoscopy;  Laterality: N/A;   MALONEY DILATION N/A 01/15/2020   Procedure: Venia Minks DILATION;  Surgeon: Daneil Dolin, MD;  [facility];  Service: Endoscopy;  Laterality: N/A;   POLYPECTOMY  09/07/2016   Procedure: POLYPECTOMY;  Surgeon: Daneil Dolin, MD;  [facility];   Service: Endoscopy;;  sigmoid colon x4   small bowel capsule  10/2007   normal   VIDEO BRONCHOSCOPY WITH RADIAL ENDOBRONCHIAL ULTRASOUND  06/14/2021   Procedure: VIDEO BRONCHOSCOPY WITH RADIAL ENDOBRONCHIAL ULTRASOUND;  Surgeon: Garner Nash, DO;  Location: [place] [facility];  Service: Pulmonary;;    Prior to Admission medications   Medication Sig Start Date End Date Taking? Authorizing Provider  atorvastatin (LIPITOR) 10 MG tablet Take 1 tablet (10 mg total) by mouth daily. 09/08/21  Yes Kathyrn Drown, MD  benztropine (COGENTIN) 1 MG tablet Take 1 tablet (1 mg total) by mouth daily. 08/31/21  Yes Cloria Spring, MD  dicyclomine (BENTYL) 10 MG capsule TAKE 1 CAPSULE BY MOUTH UP TO 3 TIMES DAILY BEFORE MEALS AS NEEDED FOR ABDOMINAL CRAMPS/LOOSE STOOLS. Patient taking differently: Take 10 mg by mouth 3 (three) times daily as needed (ABDOMINAL CRAMPS/LOOSE STOOLS.). 01/17/22  Yes Annitta Needs, NP  docusate sodium (COLACE) 100 MG capsule TAKE 1 CAPSULE BY MOUTH TWICE DAILY Patient taking differently: Take 100 mg by mouth 2 (two) times daily. 01/17/22  Yes Kathyrn Drown, MD  famotidine (PEPCID) 20 MG tablet Take 1 tablet (20 mg total) by mouth 2 (two) times daily. 01/06/22  Yes Cobb, Karie Schwalbe, NP  Fluticasone-Umeclidin-Vilant (TRELEGY ELLIPTA) 100-62.5-25 MCG/ACT AEPB Inhale 1 puff into the lungs daily. 01/06/22  Yes Cobb, Karie Schwalbe, NP  lamoTRIgine (LAMICTAL) 100 MG tablet Take 100 mg by mouth 2 (two) times daily. 01/19/22  Yes [provider]  paliperidone (INVEGA) 9 MG 24 hr tablet Take 1 tablet (9 mg total) by mouth daily. 08/31/21  Yes Cloria Spring, MD  potassium chloride (KLOR-CON) 10 MEQ tablet TAKE 2 TABLETS (20MEQ) BY MOUTH TWICE DAILY WITH FOOD Patient taking differently: Take 20 mEq by mouth 2 (two) times daily. 11/21/21  Yes Kathyrn Drown, MD  propranolol (INDERAL) 10 MG tablet TAKE 1 TABLET BY MOUTH THREE TIMES A DAY. Patient taking differently: Take 10 mg by mouth 3 (three)  times daily. 11/21/21  Yes Luking, Elayne Snare, MD  RABEprazole (ACIPHEX) 20 MG tablet TAKE 1 TABLET BY MOUTH TWICE A DAY. Patient taking differently: Take 20 mg by mouth in the morning and at bedtime. 12/16/21 03/16/22 Yes Mahon, Courtney L, NP  SENNA-TABS 8.6 MG tablet Take 2 tablets by mouth daily. 01/19/22  Yes [provider]  sertraline (ZOLOFT) 100 MG tablet Take 1 tablet (100 mg total) by mouth daily. 08/31/21  Yes Cloria Spring, MD  torsemide (DEMADEX) 20 MG tablet TAKE (1) TABLET BY MOUTH EACH MORNING. Patient taking differently: Take 20 mg by mouth daily. 11/21/21  Yes Luking, Elayne Snare, MD  traZODone (DESYREL) 100 MG tablet TAKE 2 TABLETS(200MG ) BY MOUTH AT BEDTIME. Patient taking differently: Take 200 mg by mouth at bedtime. 08/31/21  Yes Cloria Spring, MD  cycloSPORINE (RESTASIS) 0.05 % ophthalmic emulsion Place 1 drop into both eyes 2 (two) times daily.  [provider]  GOODSENSE HEMORRHOIDAL 0.25-88.44 % suppository UNWRAP AND PLACE 1 SUPPOSITORY RECTALLY 2 TIMES DAILY AS NEEDED FOR HEMORRHOIDS OR ANAL ITCHING 01/23/22   Wolfgang Phoenix, Elayne Snare, MD  hydrocortisone (ANUSOL-HC) 25 MG suppository Place 1 suppository (25 mg total) rectally 2 (two) times daily as needed for hemorrhoids or anal itching. 12/07/21   Leath-Warren, Alda Lea, NP    Allergies as of 11/30/2021 - Review Complete 11/22/2021  Allergen Reaction Noted   Thorazine [chlorpromazine hcl] Anaphylaxis 10/20/2010   Acetaminophen Other (See Comments)    Aspirin Other (See Comments)    Aspirin-acetaminophen-caffeine Other (See Comments)    Nsaids Nausea And Vomiting    Other  03/22/2019   Penicillins Nausea And Vomiting    Tomato Rash 01/17/2012    Family History  Problem Relation Age of Onset   Stroke Mother    Heart attack Father    Schizophrenia Other    Drug abuse Other    Alcohol abuse Other    Colon cancer Other        aunt   Obesity Other    COPD Other    Anxiety disorder Other    GER disease  Other    Diabetes type II Other    Anxiety disorder Other    Depression Other    Depression Sister    Schizophrenia Sister    Liver disease Neg Hx    Inflammatory bowel disease Neg Hx     Social History   Socioeconomic History   Marital status: Divorced    Spouse name: Not on file   Number of children: Not on file   Years of education: Not on file   Highest education level: High school graduate  Occupational History   Occupation: disabled    Employer: UNEMPLOYED  Tobacco Use   Smoking status: Every Day    Packs/day: 0.50    Years: 40.00    Total pack years: 20.00    Types: Cigarettes   Smokeless tobacco: Never  Vaping Use   Vaping Use: Never used  Substance and Sexual Activity   Alcohol use: No   Drug use: No   Sexual activity: Not Currently    Partners: Male    Birth control/protection: Surgical    Comment: hyst  Other Topics Concern   Not on file  Social History Narrative   Not on file   Social Determinants of Health   Financial Resource Strain: Low Risk  (04/26/2021)   Overall Financial Resource Strain (CARDIA)    Difficulty of Paying Living Expenses: Not hard at all  Food Insecurity: No Food Insecurity (04/26/2021)   Hunger Vital Sign    Worried About Running Out of Food in the Last Year: Never true    Ran Out of Food in the Last Year: Never true  Transportation Needs: No Transportation Needs (04/26/2021)   PRAPARE - Hydrologist (Medical): No    Lack of Transportation (Non-Medical): No  Physical Activity: Sufficiently Active (04/26/2021)   Exercise Vital Sign    Days of Exercise per Week: 5 days    Minutes of Exercise per Session: 40 min  Stress: No Stress Concern Present (04/26/2021)   [place]    Feeling of Stress : Only a little  Social Connections: Moderately Isolated (04/19/2020)   Social Connection and Isolation Panel [NHANES]    Frequency of  Communication with Friends and Family: More than three times a week  Frequency of Social Gatherings with Friends and Family: More than three times a week    Attends Religious Services: 1 to 4 times per year    Active Member of [employer] or Organizations: No    Attends Archivist Meetings: Never    Marital Status: Separated  Intimate Partner Violence: Not At Risk (04/26/2021)   Humiliation, Afraid, Rape, and Kick questionnaire    Fear of Current or Ex-Partner: No    Emotionally Abused: No    Physically Abused: No    Sexually Abused: No    Review of Systems: See HPI, otherwise negative ROS  Physical Exam: BP 138/79   Pulse 77   Temp 98.1 F (36.7 C) (Oral)   Resp 16   SpO2 99%  General:   Alert,  Well-developed, well-nourished, pleasant and cooperative in NAD Neck:  Supple; no masses or thyromegaly. No significant cervical adenopathy. Lungs:  Clear throughout to auscultation.   No wheezes, crackles, or rhonchi. No acute distress. Heart:  Regular rate and rhythm; no murmurs, clicks, rubs,  or gallops. Abdomen: Non-distended, normal bowel sounds.  Soft and nontender without appreciable mass or hepatosplenomegaly.  Pulses:  Normal pulses noted. Extremities:  Without clubbing or edema.  Impression/Plan:    61 year old lady with chronic diarrhea.  Distant history colonic adenoma.  She is here for diagnostic colonoscopy. The risks, benefits, limitations, alternatives and imponderables have been reviewed with the patient. Questions have been answered. All parties are agreeable.       Notice: This dictation was prepared with Dragon dictation along with smaller phrase technology. Any transcriptional errors that result from this process are unintentional and may not be corrected upon review.

## 2022-01-26 LAB — GASTROINTESTINAL PANEL BY PCR, STOOL (REPLACES STOOL CULTURE)

## 2022-01-27 ENCOUNTER — Encounter: Payer: Self-pay | Admitting: Internal Medicine

## 2022-01-27 LAB — SURGICAL PATHOLOGY

## 2022-01-30 ENCOUNTER — Encounter: Payer: Self-pay | Admitting: Podiatry

## 2022-01-30 ENCOUNTER — Ambulatory Visit (INDEPENDENT_AMBULATORY_CARE_PROVIDER_SITE_OTHER): Payer: Medicare Other | Admitting: Podiatry

## 2022-01-30 DIAGNOSIS — M79676 Pain in unspecified toe(s): Secondary | ICD-10-CM | POA: Diagnosis not present

## 2022-01-30 DIAGNOSIS — M2142 Flat foot [pes planus] (acquired), left foot: Secondary | ICD-10-CM | POA: Diagnosis not present

## 2022-01-30 DIAGNOSIS — E1142 Type 2 diabetes mellitus with diabetic polyneuropathy: Secondary | ICD-10-CM

## 2022-01-30 DIAGNOSIS — M2011 Hallux valgus (acquired), right foot: Secondary | ICD-10-CM | POA: Diagnosis not present

## 2022-01-30 DIAGNOSIS — M2141 Flat foot [pes planus] (acquired), right foot: Secondary | ICD-10-CM | POA: Diagnosis not present

## 2022-01-30 DIAGNOSIS — B351 Tinea unguium: Secondary | ICD-10-CM | POA: Diagnosis not present

## 2022-01-30 DIAGNOSIS — M2012 Hallux valgus (acquired), left foot: Secondary | ICD-10-CM | POA: Diagnosis not present

## 2022-01-30 NOTE — Progress Notes (Signed)
This patient returns to my office for at risk foot care.  This patient requires this care by a professional since this patient will be at risk due to having  diabetes.  This patient is unable to cut nails himself since the patient cannot reach his nails.These nails are painful walking and wearing shoes.  This patient presents for at risk foot care today.  General Appearance  Alert, conversant and in no acute stress.  Vascular  Dorsalis pedis and posterior tibial  pulses are palpable  bilaterally.  Capillary return is within normal limits  bilaterally. Temperature is within normal limits  bilaterally.  Neurologic  Senn-Weinstein monofilament wire test within normal limits  bilaterally. Muscle power within normal limits bilaterally.  Nails Thick disfigured discolored nails with subungual debris  from hallux to fifth toes bilaterally. No evidence of bacterial infection or drainage bilaterally.  Orthopedic  No limitations of motion  feet .  No crepitus or effusions noted.  No bony pathology or digital deformities noted.  Skin  normotropic skin with no porokeratosis noted bilaterally.  No signs of infections or ulcers noted.     Onychomycosis  Pain in right toes  Pain in left toes  Consent was obtained for treatment procedures.   Mechanical debridement of nails 1-5  bilaterally performed with a nail nipper.  Filed with dremel without incident.    Return office visit   3 months                   Told patient to return for periodic foot care and evaluation due to potential at risk complications.   Klover Priestly DPM   

## 2022-01-31 ENCOUNTER — Other Ambulatory Visit: Payer: Self-pay | Admitting: Family Medicine

## 2022-01-31 ENCOUNTER — Ambulatory Visit: Payer: Medicare Other | Admitting: Podiatry

## 2022-02-01 ENCOUNTER — Encounter (HOSPITAL_COMMUNITY): Payer: Self-pay | Admitting: Internal Medicine

## 2022-02-06 ENCOUNTER — Telehealth: Payer: Self-pay | Admitting: Family Medicine

## 2022-02-06 NOTE — Addendum Note (Signed)
Addended by: Dairl Ponder on: 02/06/2022 02:58 PM   Modules accepted: Orders

## 2022-02-06 NOTE — Telephone Encounter (Signed)
[name] (case worker) calling in to ask if it would be ok to stop Colace and Senna. Pt has been having diarrhea. (Caseworker states she has been having this for years). Pt has colonoscopy recently. Please advise. Thank you  [phone]

## 2022-02-06 NOTE — Telephone Encounter (Signed)
May stop both

## 2022-02-06 NOTE — Telephone Encounter (Signed)
[name](social worker) (DPR) notified and verbalized understanding. RX care notified

## 2022-02-10 DIAGNOSIS — Z20822 Contact with and (suspected) exposure to covid-19: Secondary | ICD-10-CM | POA: Diagnosis not present

## 2022-02-15 ENCOUNTER — Ambulatory Visit: Payer: Medicare Other | Admitting: Gastroenterology

## 2022-02-17 DIAGNOSIS — Z1152 Encounter for screening for COVID-19: Secondary | ICD-10-CM | POA: Diagnosis not present

## 2022-02-20 ENCOUNTER — Other Ambulatory Visit (HOSPITAL_COMMUNITY): Payer: Self-pay | Admitting: Psychiatry

## 2022-02-20 NOTE — Telephone Encounter (Signed)
Call for appt

## 2022-02-28 ENCOUNTER — Ambulatory Visit: Payer: Medicare Other | Admitting: Gastroenterology

## 2022-02-28 ENCOUNTER — Ambulatory Visit (HOSPITAL_COMMUNITY): Payer: Medicare Other | Admitting: Psychiatry

## 2022-03-01 ENCOUNTER — Encounter (HOSPITAL_COMMUNITY): Payer: Self-pay | Admitting: Psychiatry

## 2022-03-01 ENCOUNTER — Telehealth (INDEPENDENT_AMBULATORY_CARE_PROVIDER_SITE_OTHER): Payer: Medicare Other | Admitting: Psychiatry

## 2022-03-01 DIAGNOSIS — F251 Schizoaffective disorder, depressive type: Secondary | ICD-10-CM | POA: Diagnosis not present

## 2022-03-01 MED ORDER — TRAZODONE HCL 100 MG PO TABS
ORAL_TABLET | ORAL | 5 refills | Status: DC
Start: 2022-03-01 — End: 2023-03-05

## 2022-03-01 MED ORDER — BENZTROPINE MESYLATE 1 MG PO TABS: 1.0000 mg | ORAL_TABLET | Freq: Every day | ORAL | 5 refills | Status: DC

## 2022-03-01 MED ORDER — PALIPERIDONE ER 9 MG PO TB24: 9.0000 mg | ORAL_TABLET | Freq: Every day | ORAL | 5 refills | Status: DC

## 2022-03-01 MED ORDER — SERTRALINE HCL 100 MG PO TABS: 100.0000 mg | ORAL_TABLET | Freq: Every day | ORAL | 5 refills | Status: DC

## 2022-03-01 MED ORDER — LAMOTRIGINE 100 MG PO TABS: 100.0000 mg | ORAL_TABLET | Freq: Two times a day (BID) | ORAL | 5 refills | Status: DC

## 2022-03-01 NOTE — Progress Notes (Signed)
Virtual Visit via Telephone Note  I connected with [name] on 03/01/22 at 10:00 AM EDT by telephone and verified that I am speaking with the correct person using two identifiers.  Location: Patient: home Provider: office   I discussed the limitations, risks, security and privacy concerns of performing an evaluation and management service by telephone and the availability of in person appointments. I also discussed with the patient that there may be a patient responsible charge related to this service. The patient expressed understanding and agreed to proceed.      I discussed the assessment and treatment plan with the patient. The patient was provided an opportunity to ask questions and all were answered. The patient agreed with the plan and demonstrated an understanding of the instructions.   The patient was advised to call back or seek an in-person evaluation if the symptoms worsen or if the condition fails to improve as anticipated.  I provided 15 minutes of non-face-to-face time during this encounter.   Levonne Spiller, MD  [facility] MD/PA/NP OP Progress Note  03/01/2022 10:05 AM [name]  MRN:  [id number]  Chief Complaint:  Chief Complaint  Patient presents with   Schizophrenia   Anxiety   Follow-up   HPI: This patient is a 61 year old divorced white female who lives alone in [place].  She is on disability.  The patient returns for follow-up after about 6 months.  She states that the people in her building are really bothering her.  She claims that they harass her and knocked on her doors and bother her.  I have urged her to talk to management but she states the Teacher, early years/pre "is in his apartment getting high all the time."  I urged her to report this to her Education officer, museum.  She does enjoy time with her godson and with her boyfriend.  She claims she is not sleeping that well but it is hard to tell.  She denies significant depression thoughts of self-harm or suicide  auditory visual hallucinations.  She is compliant with her medications. Visit Diagnosis:    ICD-10-CM   1. Schizoaffective disorder, depressive type ([place])  F25.1       Past Psychiatric History: Hospitalization in her younger years, most recently outpatient treatment  Past Medical History:  Past Medical History:  Diagnosis Date   Anxiety    Aortic atherosclerosis ([place]) 09/19/2020   Seen on CAT scan 2021   Arthritis    Depression    Diabetes mellitus    Diabetes mellitus, type II ([place])    Dyspnea    Dysrhythmia    GERD (gastroesophageal reflux disease)    Heart murmur    HTN (hypertension)    Hyperglycemia    IBS (irritable bowel syndrome)    Lung nodules    right, followed by PCP, PET 11/2011   Mental retardation    MI (myocardial infarction) ([place])    Migraines    Pneumonia    Sleep apnea    cpap    Stroke [facility])     Past Surgical History:  Procedure Laterality Date   ABDOMINAL HYSTERECTOMY     BIOPSY  01/15/2020   Procedure: BIOPSY;  Surgeon: Daneil Dolin, MD;  [facility];  Service: Endoscopy;;  gastric   BIOPSY  01/25/2022   Procedure: BIOPSY;  Surgeon: Daneil Dolin, MD;  [facility];  Service: Endoscopy;;  ascending;descending;sigmoid;   BRONCHIAL BIOPSY  06/14/2021   Procedure: BRONCHIAL BIOPSIES;  Surgeon: Valeta Harms,  Octavio Graves, DO;  Location: [facility];  Service: Pulmonary;;   BRONCHIAL BRUSHINGS  06/14/2021   Procedure: BRONCHIAL BRUSHINGS;  Surgeon: Garner Nash, DO;  Location: [facility];  Service: Pulmonary;;   BRONCHIAL NEEDLE ASPIRATION BIOPSY  06/14/2021   Procedure: BRONCHIAL NEEDLE ASPIRATION BIOPSIES;  Surgeon: Garner Nash, DO;  Location: [facility];  Service: Pulmonary;;   COLONOSCOPY  11/2007   hyperplastic polyps, prior hx of adenomas    COLONOSCOPY  05/2010   incomplete due to poor prep, hyperplastic rectal polyp   COLONOSCOPY  05/05/2002   Dimunitive polyps in the rectum and left colon, cold    biopsied/removed.   Scattered few left-sided diverticula.  Regular colonic   mucosa appeared normal   COLONOSCOPY N/A 05/28/2013   Rourk: mulitple tubular adenomas removed. next tcs 05/2016   COLONOSCOPY WITH PROPOFOL N/A 09/07/2016   Dr. Gala Romney: For hyperplastic polyps removed. Next colonoscopy March 2023 given history of adenomatous colon polyps in the past.   COLONOSCOPY WITH PROPOFOL N/A 01/25/2022   Procedure: COLONOSCOPY WITH PROPOFOL;  Surgeon: Daneil Dolin, MD;  [facility];  Service: Endoscopy;  Laterality: N/A;  11:30am   ESOPHAGOGASTRODUODENOSCOPY  08/2007   moderate sized hiatal hernia   ESOPHAGOGASTRODUODENOSCOPY  05/2010   noncritical schatzki ring s/p 49F   ESOPHAGOGASTRODUODENOSCOPY (EGD) WITH ESOPHAGEAL DILATION N/A 02/06/2013   [id number] esophagus-s/p dilation up to a 20 [place] size with [facility] dilators.  Hiatal hernia   ESOPHAGOGASTRODUODENOSCOPY (EGD) WITH PROPOFOL N/A 09/07/2016   Dr. Gala Romney: Normal, status post empiric dilation of the esophagus for history of dysphagia   ESOPHAGOGASTRODUODENOSCOPY (EGD) WITH PROPOFOL N/A 05/02/2019   Normal esophagus s/p dilation, normal stomach, normal duodenum   ESOPHAGOGASTRODUODENOSCOPY (EGD) WITH PROPOFOL N/A 01/15/2020   normal esophagus s/p dilation, gastric nodule s/p biopsy. This showed reactive gastropathy with H.pylori.    EXTERNAL EAR SURGERY     bilateral   FIDUCIAL MARKER PLACEMENT  06/14/2021   Procedure: FIDUCIAL MARKER PLACEMENT;  Surgeon: Garner Nash, DO;  Location: [facility];  Service: Pulmonary;;   FOOT SURGERY     GLAUCOMA SURGERY     KNEE ARTHROPLASTY Right 11/04/2020   Procedure: COMPUTER ASSISTED TOTAL KNEE ARTHROPLASTY;  Surgeon: Rod Can, MD;  [facility];  Service: Orthopedics;  Laterality: Right;   MALONEY DILATION N/A 09/07/2016   Procedure: Venia Minks DILATION;  Surgeon: Daneil Dolin, MD;  [facility];  Service: Endoscopy;  Laterality: N/A;   MALONEY DILATION N/A 05/02/2019   Procedure:  Venia Minks DILATION;  Surgeon: Daneil Dolin, MD;  [facility];  Service: Endoscopy;  Laterality: N/A;   MALONEY DILATION N/A 01/15/2020   Procedure: Venia Minks DILATION;  Surgeon: Daneil Dolin, MD;  [facility];  Service: Endoscopy;  Laterality: N/A;   POLYPECTOMY  09/07/2016   Procedure: POLYPECTOMY;  Surgeon: Daneil Dolin, MD;  [facility];  Service: Endoscopy;;  sigmoid colon x4   POLYPECTOMY  01/25/2022   Procedure: POLYPECTOMY;  Surgeon: Daneil Dolin, MD;  [facility];  Service: Endoscopy;;  hepatic flexure and descending/sigmoid colon   small bowel capsule  10/2007   normal   VIDEO BRONCHOSCOPY WITH RADIAL ENDOBRONCHIAL ULTRASOUND  06/14/2021   Procedure: VIDEO BRONCHOSCOPY WITH RADIAL ENDOBRONCHIAL ULTRASOUND;  Surgeon: Garner Nash, DO;  [facility];  Service: Pulmonary;;    Family Psychiatric History: See below  Family History:  Family History  Problem Relation Age of Onset   Stroke Mother  Heart attack Father    Schizophrenia Other    Drug abuse Other    Alcohol abuse Other    Colon cancer Other        aunt   Obesity Other    COPD Other    Anxiety disorder Other    GER disease Other    Diabetes type II Other    Anxiety disorder Other    Depression Other    Depression Sister    Schizophrenia Sister    Liver disease Neg Hx    Inflammatory bowel disease Neg Hx     Social History:  Social History   Socioeconomic History   Marital status: Divorced    Spouse name: Not on file   Number of children: Not on file   Years of education: Not on file   Highest education level: High school graduate  Occupational History   Occupation: disabled    Employer: UNEMPLOYED  Tobacco Use   Smoking status: Every Day    Packs/day: 0.50    Years: 40.00    Total pack years: 20.00    Types: Cigarettes   Smokeless tobacco: Never  Vaping Use   Vaping Use: Never used  Substance and Sexual Activity   Alcohol use: No    Drug use: No   Sexual activity: Not Currently    Partners: Male    Birth control/protection: Surgical    Comment: hyst  Other Topics Concern   Not on file  Social History Narrative   Not on file   Social Determinants of Health   Financial Resource Strain: Low Risk  (04/26/2021)   Overall Financial Resource Strain (CARDIA)    Difficulty of Paying Living Expenses: Not hard at all  Food Insecurity: No Food Insecurity (04/26/2021)   Hunger Vital Sign    Worried About Running Out of Food in the Last Year: Never true    [place] in the Last Year: Never true  Transportation Needs: No Transportation Needs (04/26/2021)   PRAPARE - Hydrologist (Medical): No    Lack of Transportation (Non-Medical): No  Physical Activity: Sufficiently Active (04/26/2021)   Exercise Vital Sign    Days of Exercise per Week: 5 days    Minutes of Exercise per Session: 40 min  Stress: No Stress Concern Present (04/26/2021)   [place]    Feeling of Stress : Only a little  Social Connections: Moderately Isolated (04/19/2020)   Social Connection and Isolation Panel [NHANES]    Frequency of Communication with Friends and Family: More than three times a week    Frequency of Social Gatherings with Friends and Family: More than three times a week    Attends Religious Services: 1 to 4 times per year    Active Member of [employer] or Organizations: No    Attends Archivist Meetings: Never    Marital Status: Separated    Allergies:  Allergies  Allergen Reactions   Thorazine [Chlorpromazine Hcl] Anaphylaxis   Acetaminophen Other (See Comments)    Makes pt dizzy   Aspirin Other (See Comments)    seizure   Aspirin-Acetaminophen-Caffeine Other (See Comments)    seizure   Nsaids Nausea And Vomiting   Other     Acidic foods   Penicillins Nausea And Vomiting    Has patient had a PCN reaction causing  immediate rash, facial/tongue/throat swelling, SOB or lightheadedness with hypotension:Yes Has patient had a PCN reaction  causing severe rash involving mucus membranes or skin necrosis:Yes Has patient had a PCN reaction that required hospitalization:Yes Has patient had a PCN reaction occurring within the last 10 years:No If all of the above answers are "NO", then may proceed with Cephalosporin use.    Tomato Rash    Metabolic Disorder Labs: Lab Results  Component Value Date   HGBA1C 6.3 (H) 01/03/2022   MPG 126 10/25/2020   No results found for: "PROLACTIN" Lab Results  Component Value Date   CHOL 146 09/08/2021   TRIG 82 09/08/2021   HDL 80 09/08/2021   CHOLHDL 1.8 09/08/2021   VLDL 21 07/20/2014   LDLCALC 50 09/08/2021   LDLCALC 48 03/03/2021   Lab Results  Component Value Date   TSH 4.492 03/22/2019   TSH 3.180 02/28/2018    Therapeutic Level Labs: No results found for: "LITHIUM" No results found for: "VALPROATE" No results found for: "CBMZ"  Current Medications: Current Outpatient Medications  Medication Sig Dispense Refill   atorvastatin (LIPITOR) 10 MG tablet Take 1 tablet (10 mg total) by mouth daily. 30 tablet 6   benztropine (COGENTIN) 1 MG tablet Take 1 tablet (1 mg total) by mouth daily. 30 tablet 5   cycloSPORINE (RESTASIS) 0.05 % ophthalmic emulsion Place 1 drop into both eyes 2 (two) times daily.     dicyclomine (BENTYL) 10 MG capsule TAKE 1 CAPSULE BY MOUTH UP TO 3 TIMES DAILY BEFORE MEALS AS NEEDED FOR ABDOMINAL CRAMPS/LOOSE STOOLS. (Patient taking differently: Take 10 mg by mouth 3 (three) times daily as needed (ABDOMINAL CRAMPS/LOOSE STOOLS.).) 90 capsule 5   famotidine (PEPCID) 20 MG tablet Take 1 tablet (20 mg total) by mouth 2 (two) times daily. 60 tablet 5   Fluticasone-Umeclidin-Vilant (TRELEGY ELLIPTA) 100-62.5-25 MCG/ACT AEPB Inhale 1 puff into the lungs daily. 60 each 5   GOODSENSE HEMORRHOIDAL 0.25-88.44 % suppository UNWRAP AND PLACE 1  SUPPOSITORY RECTALLY 2 TIMES DAILY AS NEEDED FOR HEMORRHOIDS OR ANAL ITCHING 12 suppository 10   hydrocortisone (ANUSOL-HC) 25 MG suppository Place 1 suppository (25 mg total) rectally 2 (two) times daily as needed for hemorrhoids or anal itching. 12 suppository 0   lamoTRIgine (LAMICTAL) 100 MG tablet Take 1 tablet (100 mg total) by mouth 2 (two) times daily. 60 tablet 5   paliperidone (INVEGA) 9 MG 24 hr tablet Take 1 tablet (9 mg total) by mouth daily. 30 tablet 5   potassium chloride (KLOR-CON) 10 MEQ tablet TAKE 2 TABLETS (20MEQ) BY MOUTH TWICE DAILY WITH FOOD 120 tablet 5   propranolol (INDERAL) 10 MG tablet TAKE 1 TABLET BY MOUTH THREE TIMES A DAY. 90 tablet 5   RABEprazole (ACIPHEX) 20 MG tablet TAKE 1 TABLET BY MOUTH TWICE A DAY. (Patient taking differently: Take 20 mg by mouth in the morning and at bedtime.) 180 tablet 3   sertraline (ZOLOFT) 100 MG tablet Take 1 tablet (100 mg total) by mouth daily. 30 tablet 5   torsemide (DEMADEX) 20 MG tablet TAKE (1) TABLET BY MOUTH EACH MORNING. 30 tablet 5   traZODone (DESYREL) 100 MG tablet TAKE 2 TABLETS(200MG ) BY MOUTH AT BEDTIME. 60 tablet 5   No current facility-administered medications for this visit.     Musculoskeletal: Strength & Muscle Tone: na Gait & Station: na Patient leans: N/A  Psychiatric Specialty Exam: Review of Systems  Musculoskeletal:  Positive for arthralgias and back pain.  Psychiatric/Behavioral:  The patient is nervous/anxious.   All other systems reviewed and are negative.   There were no vitals  taken for this visit.There is no height or weight on file to calculate BMI.  General Appearance: na  Eye Contact:  NA  Speech:  Clear and Coherent  Volume:  Normal  Mood:  Anxious and Euthymic  Affect:  NA  Thought Process:  Goal Directed  Orientation:  Full (Time, Place, and Person)  Thought Content: Rumination   Suicidal Thoughts:  No  Homicidal Thoughts:  No  Memory:  Immediate;   Good Recent;    Fair Remote;   Poor  Judgement:  Poor  Insight:  Shallow  Psychomotor Activity:  Decreased  Concentration:  Concentration: Fair and Attention Span: Fair  Recall:  [employer] of Knowledge: Fair  Language: Good  Akathisia:  No  Handed:  Right  AIMS (if indicated): not done  Assets:  Communication Skills Resilience Social Support  ADL's:  Intact  Cognition: Impaired,  Mild  Sleep:  Fair   Screenings: GAD-7    Flowsheet Row Office Visit from 03/03/2021 in [facility] Visit from 05/31/2020 in [facility] Office Visit from 04/19/2020 in [facility]  Total GAD-7 Score 21 15 12       PHQ2-9    Flowsheet Row Video Visit from 03/01/2022 in [facility] Office Visit from 08/31/2021 in [facility] from 04/26/2021 in [facility] [place] Visit from 03/03/2021 in [facility] Video Visit from 12/02/2020 in [facility]  PHQ-2 Total Score 1 4 1 4 1   PHQ-9 Total Score -- 13 1 15  --      Flowsheet Row Video Visit from 03/01/2022 in [facility] [place] Admission (Discharged) from 01/25/2022 in [facility] from 01/23/2022 in [facility] No Risk No Risk No Risk        Assessment and Plan: This patient is a 61 year old female with a history of schizoaffective disorder mild cognitive impairment.  She seems to be fairly stable.  She will continue Zoloft 100 mg daily for depression, Invega 9 mg daily for schizophrenia symptoms, Lamictal 100 mg twice daily for mood stabilization, Cogentin 1 mg to prevent side effects from Invega, trazodone 200 mg at bedtime for sleep.  She will return to see me in 3 months  Collaboration of Care: Collaboration of Care: Primary Care Provider AEB  notes are shared with PCP through the epic system  Patient/Guardian was advised Release of Information must be obtained prior to any record release in order to collaborate their care with an outside provider. Patient/Guardian was advised if they have not already done so to contact the registration department to sign all necessary forms in order for [place] to release information regarding their care.   Consent: Patient/Guardian gives verbal consent for treatment and assignment of benefits for services provided during this visit. Patient/Guardian expressed understanding and agreed to proceed.    Levonne Spiller, MD 03/01/2022, 10:05 AM

## 2022-03-07 ENCOUNTER — Encounter: Payer: Self-pay | Admitting: Gastroenterology

## 2022-03-07 ENCOUNTER — Ambulatory Visit (INDEPENDENT_AMBULATORY_CARE_PROVIDER_SITE_OTHER): Payer: Medicare Other | Admitting: Gastroenterology

## 2022-03-07 VITALS — BP 154/90 | HR 90 | Temp 98.1°F | Ht 70.0 in | Wt 250.0 lb

## 2022-03-07 DIAGNOSIS — K649 Unspecified hemorrhoids: Secondary | ICD-10-CM | POA: Diagnosis not present

## 2022-03-07 DIAGNOSIS — K529 Noninfective gastroenteritis and colitis, unspecified: Secondary | ICD-10-CM

## 2022-03-07 MED ORDER — LOPERAMIDE HCL 2 MG PO TABS
2.0000 mg | ORAL_TABLET | Freq: Every day | ORAL | 2 refills | Status: DC
Start: 2022-03-07 — End: 2022-07-31

## 2022-03-07 MED ORDER — RIFAXIMIN 550 MG PO TABS
550.0000 mg | ORAL_TABLET | Freq: Three times a day (TID) | ORAL | 0 refills | Status: AC
Start: 2022-03-07 — End: 2022-03-21

## 2022-03-07 MED ORDER — HYDROCORTISONE ACETATE 25 MG RE SUPP: 25.0000 mg | Freq: Two times a day (BID) | RECTAL | 0 refills | Status: AC

## 2022-03-07 NOTE — Progress Notes (Signed)
Gastroenterology Office Note     Primary Care Physician:  Kathyrn Drown, MD  Primary Gastroenterologist: Dr. Gala Romney    Chief Complaint   Chief Complaint  Patient presents with   Hemorrhoids    Patient arrives with social worker [name]. Having trouble with hemorrhoids, frequent  stools, some pain when having to have a BM, has seen some blood in stools a couple of days ago.      History of Present Illness   [name] is a 61 y.o. female presenting today in follow-up with a history of GERD, IBS, dysphagia. She also had H.pylori on biopsies and needs H.pylori eradication in future. Found to have carcinoid tumor right lung in Dec 2022 and has undergone SBRT with radiation Oncology. In interim from last appt, she had colonoscopy with multiple colon polyps, colonic lipoma, negative stool studies and negative microscopic colitis. Needs surveillance in 2026.   Dealing with diarrhea for months now. In the past, has had spans of time with alternating constipation and diarrhea. Now dealing with diarrhea. Negative stool studies. Notes prolapsing rectal tissue. Rectal discomfort. Intermittent bleeding.   Taking dicyclomine for loose stools. No weight loss. In fact, she has gained weight.        Past Medical History:  Diagnosis Date   Anxiety    Aortic atherosclerosis ([place]) 09/19/2020   Seen on CAT scan 2021   Arthritis    Depression    Diabetes mellitus    Diabetes mellitus, type II ([place])    Dyspnea    Dysrhythmia    GERD (gastroesophageal reflux disease)    Heart murmur    HTN (hypertension)    Hyperglycemia    IBS (irritable bowel syndrome)    Lung nodules    right, followed by PCP, PET 11/2011   Mental retardation    MI (myocardial infarction) ([place])    Migraines    Pneumonia    Sleep apnea    cpap    Stroke [facility])     Past Surgical History:  Procedure Laterality Date   ABDOMINAL HYSTERECTOMY     BIOPSY  01/15/2020   Procedure: BIOPSY;  Surgeon: Daneil Dolin, MD;  [facility];  Service: Endoscopy;;  gastric   BIOPSY  01/25/2022   Procedure: BIOPSY;  Surgeon: Daneil Dolin, MD;  [facility];  Service: Endoscopy;;  ascending;descending;sigmoid;   BRONCHIAL BIOPSY  06/14/2021   Procedure: BRONCHIAL BIOPSIES;  Surgeon: Garner Nash, DO;  Location: [place] [facility];  Service: Pulmonary;;   BRONCHIAL BRUSHINGS  06/14/2021   Procedure: BRONCHIAL BRUSHINGS;  Surgeon: Garner Nash, DO;  Location: [place] [facility];  Service: Pulmonary;;   BRONCHIAL NEEDLE ASPIRATION BIOPSY  06/14/2021   Procedure: BRONCHIAL NEEDLE ASPIRATION BIOPSIES;  Surgeon: Garner Nash, DO;  Location: [place] [facility];  Service: Pulmonary;;   COLONOSCOPY  11/2007   hyperplastic polyps, prior hx of adenomas    COLONOSCOPY  05/2010   incomplete due to poor prep, hyperplastic rectal polyp   COLONOSCOPY  05/05/2002   Dimunitive polyps in the rectum and left colon, cold    biopsied/removed.  Scattered few left-sided diverticula.  Regular colonic   mucosa appeared normal   COLONOSCOPY N/A 05/28/2013   [place]k: mulitple tubular adenomas removed. next tcs 05/2016   COLONOSCOPY WITH PROPOFOL N/A 09/07/2016   Dr. Gala Romney: For hyperplastic polyps removed. Next colonoscopy March 2023 given history of adenomatous colon polyps in the past.   COLONOSCOPY WITH PROPOFOL N/A 01/25/2022  Procedure: COLONOSCOPY WITH PROPOFOL;  Surgeon: Daneil Dolin, MD;  [facility];  Service: Endoscopy;  Laterality: N/A;  11:30am   ESOPHAGOGASTRODUODENOSCOPY  08/2007   moderate sized hiatal hernia   ESOPHAGOGASTRODUODENOSCOPY  05/2010   noncritical schatzki ring s/p 64F   ESOPHAGOGASTRODUODENOSCOPY (EGD) WITH ESOPHAGEAL DILATION N/A 02/06/2013   NWG:NFAOZH esophagus-s/p dilation up to a 66 [place] size with [facility] dilators.  Hiatal hernia   ESOPHAGOGASTRODUODENOSCOPY (EGD) WITH PROPOFOL N/A 09/07/2016   Dr. Gala Romney: Normal, status post empiric dilation of the esophagus for history of  dysphagia   ESOPHAGOGASTRODUODENOSCOPY (EGD) WITH PROPOFOL N/A 05/02/2019   Normal esophagus s/p dilation, normal stomach, normal duodenum   ESOPHAGOGASTRODUODENOSCOPY (EGD) WITH PROPOFOL N/A 01/15/2020   normal esophagus s/p dilation, gastric nodule s/p biopsy. This showed reactive gastropathy with H.pylori.    EXTERNAL EAR SURGERY     bilateral   FIDUCIAL MARKER PLACEMENT  06/14/2021   Procedure: FIDUCIAL MARKER PLACEMENT;  Surgeon: Garner Nash, DO;  Location: [place] [facility];  Service: Pulmonary;;   FOOT SURGERY     GLAUCOMA SURGERY     KNEE ARTHROPLASTY Right 11/04/2020   Procedure: COMPUTER ASSISTED TOTAL KNEE ARTHROPLASTY;  Surgeon: Rod Can, MD;  Location: [facility];  Service: Orthopedics;  Laterality: Right;   MALONEY DILATION N/A 09/07/2016   Procedure: Venia Minks DILATION;  Surgeon: Daneil Dolin, MD;  [facility];  Service: Endoscopy;  Laterality: N/A;   MALONEY DILATION N/A 05/02/2019   Procedure: Venia Minks DILATION;  Surgeon: Daneil Dolin, MD;  [facility];  Service: Endoscopy;  Laterality: N/A;   MALONEY DILATION N/A 01/15/2020   Procedure: Venia Minks DILATION;  Surgeon: Daneil Dolin, MD;  [facility];  Service: Endoscopy;  Laterality: N/A;   POLYPECTOMY  09/07/2016   Procedure: POLYPECTOMY;  Surgeon: Daneil Dolin, MD;  [facility];  Service: Endoscopy;;  sigmoid colon x4   POLYPECTOMY  01/25/2022   Procedure: POLYPECTOMY;  Surgeon: Daneil Dolin, MD;  [facility];  Service: Endoscopy;;  hepatic flexure and descending/sigmoid colon   small bowel capsule  10/2007   normal   VIDEO BRONCHOSCOPY WITH RADIAL ENDOBRONCHIAL ULTRASOUND  06/14/2021   Procedure: VIDEO BRONCHOSCOPY WITH RADIAL ENDOBRONCHIAL ULTRASOUND;  Surgeon: Garner Nash, DO;  Location: [facility];  Service: Pulmonary;;    Current Outpatient Medications  Medication Sig Dispense Refill   atorvastatin (LIPITOR) 10 MG tablet Take 1 tablet (10 mg total)  by mouth daily. 30 tablet 6   benztropine (COGENTIN) 1 MG tablet Take 1 tablet (1 mg total) by mouth daily. 30 tablet 5   cycloSPORINE (RESTASIS) 0.05 % ophthalmic emulsion Place 1 drop into both eyes 2 (two) times daily.     dicyclomine (BENTYL) 10 MG capsule TAKE 1 CAPSULE BY MOUTH UP TO 3 TIMES DAILY BEFORE MEALS AS NEEDED FOR ABDOMINAL CRAMPS/LOOSE STOOLS. (Patient taking differently: Take 10 mg by mouth 3 (three) times daily as needed (ABDOMINAL CRAMPS/LOOSE STOOLS.).) 90 capsule 5   famotidine (PEPCID) 20 MG tablet Take 1 tablet (20 mg total) by mouth 2 (two) times daily. 60 tablet 5   Fluticasone-Umeclidin-Vilant (TRELEGY ELLIPTA) 100-62.5-25 MCG/ACT AEPB Inhale 1 puff into the lungs daily. 60 each 5   GOODSENSE HEMORRHOIDAL 0.25-88.44 % suppository UNWRAP AND PLACE 1 SUPPOSITORY RECTALLY 2 TIMES DAILY AS NEEDED FOR HEMORRHOIDS OR ANAL ITCHING 12 suppository 10   hydrocortisone (ANUSOL-HC) 25 MG suppository Place 1 suppository (25 mg total) rectally 2 (two) times daily as needed  for hemorrhoids or anal itching. 12 suppository 0   lamoTRIgine (LAMICTAL) 100 MG tablet Take 1 tablet (100 mg total) by mouth 2 (two) times daily. 60 tablet 5   paliperidone (INVEGA) 9 MG 24 hr tablet Take 1 tablet (9 mg total) by mouth daily. 30 tablet 5   potassium chloride (KLOR-CON) 10 MEQ tablet TAKE 2 TABLETS (20MEQ) BY MOUTH TWICE DAILY WITH FOOD 120 tablet 5   propranolol (INDERAL) 10 MG tablet TAKE 1 TABLET BY MOUTH THREE TIMES A DAY. 90 tablet 5   RABEprazole (ACIPHEX) 20 MG tablet TAKE 1 TABLET BY MOUTH TWICE A DAY. (Patient taking differently: Take 20 mg by mouth in the morning and at bedtime.) 180 tablet 3   sertraline (ZOLOFT) 100 MG tablet Take 1 tablet (100 mg total) by mouth daily. 30 tablet 5   torsemide (DEMADEX) 20 MG tablet TAKE (1) TABLET BY MOUTH EACH MORNING. 30 tablet 5   traZODone (DESYREL) 100 MG tablet TAKE 2 TABLETS(200MG ) BY MOUTH AT BEDTIME. 60 tablet 5   No current  facility-administered medications for this visit.    Allergies as of 03/07/2022 - Review Complete 03/01/2022  Allergen Reaction Noted   Thorazine [chlorpromazine hcl] Anaphylaxis 10/20/2010   Acetaminophen Other (See Comments)    Aspirin Other (See Comments)    Aspirin-acetaminophen-caffeine Other (See Comments)    Nsaids Nausea And Vomiting    Other  03/22/2019   Penicillins Nausea And Vomiting    Tomato Rash 01/17/2012    Family History  Problem Relation Age of Onset   Stroke Mother    Heart attack Father    Schizophrenia Other    Drug abuse Other    Alcohol abuse Other    Colon cancer Other        aunt   Obesity Other    COPD Other    Anxiety disorder Other    GER disease Other    Diabetes type II Other    Anxiety disorder Other    Depression Other    Depression Sister    Schizophrenia Sister    Liver disease Neg Hx    Inflammatory bowel disease Neg Hx     Social History   Socioeconomic History   Marital status: Divorced    Spouse name: Not on file   Number of children: Not on file   Years of education: Not on file   Highest education level: High school graduate  Occupational History   Occupation: disabled    Employer: UNEMPLOYED  Tobacco Use   Smoking status: Every Day    Packs/day: 0.50    Years: 40.00    Total pack years: 20.00    Types: Cigarettes    Passive exposure: Current   Smokeless tobacco: Never  Vaping Use   Vaping Use: Never used  Substance and Sexual Activity   Alcohol use: No   Drug use: No   Sexual activity: Not Currently    Partners: Male    Birth control/protection: Surgical    Comment: hyst  Other Topics Concern   Not on file  Social History Narrative   Not on file   Social Determinants of Health   Financial Resource Strain: Low Risk  (04/26/2021)   Overall Financial Resource Strain (CARDIA)    Difficulty of Paying Living Expenses: Not hard at all  Food Insecurity: No Food Insecurity (04/26/2021)   Hunger Vital Sign     Worried About Running Out of Food in the Last Year: Never true    Ran  Out of Food in the Last Year: Never true  Transportation Needs: No Transportation Needs (04/26/2021)   PRAPARE - Hydrologist (Medical): No    Lack of Transportation (Non-Medical): No  Physical Activity: Sufficiently Active (04/26/2021)   Exercise Vital Sign    Days of Exercise per Week: 5 days    Minutes of Exercise per Session: 40 min  Stress: No Stress Concern Present (04/26/2021)   [place]    Feeling of Stress : Only a little  Social Connections: Moderately Isolated (04/19/2020)   Social Connection and Isolation Panel [NHANES]    Frequency of Communication with Friends and Family: More than three times a week    Frequency of Social Gatherings with Friends and Family: More than three times a week    Attends Religious Services: 1 to 4 times per year    Active Member of [employer] or Organizations: No    Attends Archivist Meetings: Never    Marital Status: Separated  Intimate Partner Violence: Not At Risk (04/26/2021)   Humiliation, Afraid, Rape, and Kick questionnaire    Fear of Current or Ex-Partner: No    Emotionally Abused: No    Physically Abused: No    Sexually Abused: No     Review of Systems   Gen: Denies any fever, chills, fatigue, weight loss, lack of appetite.  CV: Denies chest pain, heart palpitations, peripheral edema, syncope.  Resp: Denies shortness of breath at rest or with exertion. Denies wheezing or cough.  GI: see HPI GU : Denies urinary burning, urinary frequency, urinary hesitancy MS: Denies joint pain, muscle weakness, cramps, or limitation of movement.  Derm: Denies rash, itching, dry skin Psych: Denies depression, anxiety, memory loss, and confusion Heme: Denies bruising, bleeding, and enlarged lymph nodes.   Physical Exam   BP (!) 154/90 (BP Location: Left Arm, Patient  Position: Sitting, Cuff Size: Large)   Pulse 90   Temp 98.1 F (36.7 C) (Oral)   Ht 5\' 10"  (1.778 m)   Wt 250 lb (113.4 kg)   BMI 35.87 kg/m  General:   Alert and oriented. Pleasant and cooperative. Well-nourished and well-developed.  Head:  Normocephalic and atraumatic. Eyes:  Without icterus Abdomen:  +BS, soft, non-tender and non-distended. No HSM noted. No guarding or rebound. No masses appreciated.  Rectal:  no external hemorrhoids. DRE with left lateral column prominence. No pain. No gross blood. Lax sphincter tone noted.  Msk:  Symmetrical without gross deformities. Normal posture. Extremities:  Without edema. Neurologic:  Alert and  oriented x4;  grossly normal neurologically. Skin:  Intact without significant lesions or rashes. Psych:  Alert and cooperative. Normal mood and affect.   Assessment   [name] is a 61 y.o. female presenting today in follow-up with a history of GERD, IBS, dysphagia. She also had H.pylori on biopsies and needs H.pylori eradication in future. Found to have carcinoid tumor right lung in Dec 2022 and has undergone SBRT with radiation Oncology. In interim from last appt, she had colonoscopy with multiple colon polyps, colonic lipoma, negative stool studies and negative microscopic colitis. Needs surveillance in 2026.   Diarrhea: in past has had constipation and alternating diarrhea, now with persistent diarrhea. Colonoscopy as noted unrevealing. Stool studies negative. No celiac serologies on file but doubt this. She is gaining weight. Suspect IBS-D. Will trial Xifaxan course and start Imodium once daily. She is to call if constipation or let her  support person, [name], know.   Rectal bleeding: due to internal hemorrhoids. Can consider banding at next appt. Anusol suppositories sent to pharmacy.    PLAN    Continue dicyclomine  Add Xifaxan for 2 week course Imodium daily Anusol suppositories Return in 4 weeks Consider banding    Annitta Needs, PhD, [facility] Gastroenterology

## 2022-03-07 NOTE — Patient Instructions (Signed)
I have sent in the suppositories to use twice a day for a week.   I have sent in Xifaxan to take three times a day for 2 weeks, then it will stop.  You can try one Imodium tablet each morning. Please let us know if you have any constipation with this!  I will see you in 4 weeks!  I enjoyed seeing you again today! As you know, I value our relationship and want to provide genuine, compassionate, and quality care. I welcome your feedback. If you receive a survey regarding your visit,  I greatly appreciate you taking time to fill this out. See you next time!  Annitta Needs, PhD, ANP-BC [facility] Gastroenterology

## 2022-03-08 ENCOUNTER — Other Ambulatory Visit (HOSPITAL_COMMUNITY): Payer: Self-pay | Admitting: Family Medicine

## 2022-03-08 DIAGNOSIS — Z1231 Encounter for screening mammogram for malignant neoplasm of breast: Secondary | ICD-10-CM

## 2022-03-14 ENCOUNTER — Ambulatory Visit (INDEPENDENT_AMBULATORY_CARE_PROVIDER_SITE_OTHER): Payer: Medicare Other | Admitting: Family Medicine

## 2022-03-14 ENCOUNTER — Encounter: Payer: Self-pay | Admitting: Family Medicine

## 2022-03-14 VITALS — BP 120/70 | HR 78 | Temp 97.5°F | Ht 70.0 in | Wt 251.0 lb

## 2022-03-14 DIAGNOSIS — E785 Hyperlipidemia, unspecified: Secondary | ICD-10-CM | POA: Diagnosis not present

## 2022-03-14 DIAGNOSIS — S9032XA Contusion of left foot, initial encounter: Secondary | ICD-10-CM | POA: Diagnosis not present

## 2022-03-14 DIAGNOSIS — F17200 Nicotine dependence, unspecified, uncomplicated: Secondary | ICD-10-CM | POA: Diagnosis not present

## 2022-03-14 DIAGNOSIS — K529 Noninfective gastroenteritis and colitis, unspecified: Secondary | ICD-10-CM | POA: Diagnosis not present

## 2022-03-14 MED ORDER — ATORVASTATIN CALCIUM 10 MG PO TABS: 10.0000 mg | ORAL_TABLET | Freq: Every day | ORAL | 6 refills | Status: DC

## 2022-03-14 MED ORDER — POTASSIUM CHLORIDE ER 10 MEQ PO TBCR
EXTENDED_RELEASE_TABLET | ORAL | 5 refills | Status: DC
Start: 2022-03-14 — End: 2023-03-07

## 2022-03-14 NOTE — Progress Notes (Signed)
   Subjective:    Patient ID: [name], female    DOB: [birth year], 61 y.o.   MRN: [id number]  HPI  Follow for diarrhea ongoing for approx 1 yr , not better with meds Patient has intermittent loose stools.  Sometimes she states she goes up to 20-25 times a day which seems excessive.  It is often difficult to know the precise facts with this patient. Diarrhea was just recently addressed by gastroenterology just approximately a week and a half ago Patient still smokes she knows she needs to quit she has been counseled to quit she is followed by pulmonology for her lung related issues and she is being treated with radiation oncology for the lung growth they will be doing a follow-up CT scan in the future  Left foot injury- ash tray hit it yesterday her foot got hit because pain discomfort some swelling but she is able to walk on it okay She does take her cholesterol medicine on a regular basis She sees her psychiatrist on a regular basis Her diabetes is under good control. Review of Systems     Objective:   Physical Exam General-in no acute distress Eyes-no discharge Lungs-respiratory rate normal, CTA CV-no murmurs,RRR Extremities skin warm dry no edema Neuro grossly normal Behavior normal, alert        Assessment & Plan:  Diet medic eye exam needed social worker states she will check into some resources to get this completed if unable to do it through them we will do a referral  [place]ary than likely irritable bowel.  Recommend FODMAP diet Also dicyclomine as needed Follow through with gastroenterology  Smoker-counseled to quit unlikely she will ever do so  Lung growth followed by pulmonary  No new labs necessary today  We did discuss immunizations including flu shot RSV shot  Foot contusion with swelling I do not feel x-ray warranted currently more likely a bruise should gradually get better

## 2022-03-23 ENCOUNTER — Emergency Department (HOSPITAL_COMMUNITY)
Admission: EM | Admit: 2022-03-23 | Discharge: 2022-03-23 | Disposition: A | Discharge status: Home / Self Care | Payer: Medicare Other | Attending: Emergency Medicine | Admitting: Emergency Medicine

## 2022-03-23 ENCOUNTER — Emergency Department (HOSPITAL_COMMUNITY): Payer: Medicare Other

## 2022-03-23 ENCOUNTER — Encounter (HOSPITAL_COMMUNITY): Payer: Self-pay | Admitting: Emergency Medicine

## 2022-03-23 ENCOUNTER — Other Ambulatory Visit: Payer: Self-pay

## 2022-03-23 DIAGNOSIS — R29898 Other symptoms and signs involving the musculoskeletal system: Secondary | ICD-10-CM | POA: Diagnosis not present

## 2022-03-23 DIAGNOSIS — R531 Weakness: Secondary | ICD-10-CM | POA: Diagnosis not present

## 2022-03-23 DIAGNOSIS — I10 Essential (primary) hypertension: Secondary | ICD-10-CM | POA: Diagnosis not present

## 2022-03-23 DIAGNOSIS — Z743 Need for continuous supervision: Secondary | ICD-10-CM | POA: Diagnosis not present

## 2022-03-23 DIAGNOSIS — M79605 Pain in left leg: Secondary | ICD-10-CM | POA: Diagnosis not present

## 2022-03-23 DIAGNOSIS — M5432 Sciatica, left side: Secondary | ICD-10-CM | POA: Insufficient documentation

## 2022-03-23 LAB — URINALYSIS, ROUTINE W REFLEX MICROSCOPIC
Bacteria, UA: NONE SEEN
Bilirubin Urine: NEGATIVE
Glucose, UA: NEGATIVE mg/dL
Ketones, ur: NEGATIVE mg/dL
Leukocytes,Ua: NEGATIVE
Nitrite: NEGATIVE
Protein, ur: NEGATIVE mg/dL
Specific Gravity, Urine: 1.004 — ABNORMAL LOW (ref 1.005–1.030)
pH: 5 (ref 5.0–8.0)

## 2022-03-23 LAB — CBC
HCT: 42.4 % (ref 36.0–46.0)
Hemoglobin: 13.9 g/dL (ref 12.0–15.0)
MCH: 31.4 pg (ref 26.0–34.0)
MCHC: 32.8 g/dL (ref 30.0–36.0)
MCV: 95.9 fL (ref 80.0–100.0)
Platelets: 149 10*3/uL — ABNORMAL LOW (ref 150–400)
RBC: 4.42 MIL/uL (ref 3.87–5.11)
RDW: 14.4 % (ref 11.5–15.5)
WBC: 4.1 10*3/uL (ref 4.0–10.5)
nRBC: 0 % (ref 0.0–0.2)

## 2022-03-23 LAB — BASIC METABOLIC PANEL
Anion gap: 11 (ref 5–15)
BUN: 9 mg/dL (ref 6–20)
CO2: 30 mmol/L (ref 22–32)
Calcium: 9 mg/dL (ref 8.9–10.3)
Chloride: 97 mmol/L — ABNORMAL LOW (ref 98–111)
Creatinine, Ser: 0.83 mg/dL (ref 0.44–1.00)
GFR, Estimated: >60 mL/min (ref >60)
Glucose, Bld: 107 mg/dL — ABNORMAL HIGH (ref 70–99)
Potassium: 3.7 mmol/L (ref 3.5–5.1)
Sodium: 138 mmol/L (ref 135–145)

## 2022-03-23 NOTE — Discharge Instructions (Signed)
Continue current medications.  Return if any problems.

## 2022-03-23 NOTE — ED Triage Notes (Signed)
Pt BIB [facility] for left leg weakness, per pt she had a stroke because she can't move her left leg, in triage pt was able to move left leg and hold for 5 seconds, able to do the same with right leg, further evaluation pt states her leg has been weak since Sunday morning, sounds like sciatica by symptoms.

## 2022-03-26 NOTE — ED Provider Notes (Signed)
[facility] EMERGENCY DEPARTMENT Provider Note   CSN: [id number] Arrival date & time: 03/23/22  1248     History  Chief Complaint  Patient presents with   Extremity Weakness    [name] is a 61 y.o. female.  Pt complains of left leg weakness.  Pt reports she thinks she had a stroke 4 days ago.  Pt reports she can not use her left leg.  Pt reports she uses a walker but is weaker than before.  Pt reports she has had a previous stroke with left leg weakness.  Pt denies any fever or chills.  No cough or congestion.  Pt denies uti symptoms    The history is provided by the patient.  Extremity Weakness This is a new problem. The problem has not changed since onset.Pertinent negatives include no abdominal pain. Nothing aggravates the symptoms. Nothing relieves the symptoms. She has tried nothing for the symptoms. The treatment provided no relief.       Home Medications Prior to Admission medications   Medication Sig Start Date End Date Taking? Authorizing Provider  atorvastatin (LIPITOR) 10 MG tablet Take 1 tablet (10 mg total) by mouth daily. 03/14/22  Yes Kathyrn Drown, MD  benztropine (COGENTIN) 1 MG tablet Take 1 tablet (1 mg total) by mouth daily. 03/01/22  Yes Cloria Spring, MD  cycloSPORINE (RESTASIS) 0.05 % ophthalmic emulsion Place 1 drop into both eyes 2 (two) times daily.   Yes [provider]  dicyclomine (BENTYL) 10 MG capsule TAKE 1 CAPSULE BY MOUTH UP TO 3 TIMES DAILY BEFORE MEALS AS NEEDED FOR ABDOMINAL CRAMPS/LOOSE STOOLS. Patient taking differently: Take 10 mg by mouth 3 (three) times daily as needed (ABDOMINAL CRAMPS/LOOSE STOOLS.). 01/17/22  Yes Annitta Needs, NP  famotidine (PEPCID) 20 MG tablet Take 1 tablet (20 mg total) by mouth 2 (two) times daily. 01/06/22  Yes Cobb, Karie Schwalbe, NP  Fluticasone-Umeclidin-Vilant (TRELEGY ELLIPTA) 100-62.5-25 MCG/ACT AEPB Inhale 1 puff into the lungs daily. 01/06/22  Yes Cobb, Karie Schwalbe, NP  GOODSENSE HEMORRHOIDAL  0.25-88.44 % suppository UNWRAP AND PLACE 1 SUPPOSITORY RECTALLY 2 TIMES DAILY AS NEEDED FOR HEMORRHOIDS OR ANAL ITCHING Patient taking differently: Place 1 suppository rectally 2 (two) times daily as needed for hemorrhoids. 01/23/22  Yes Luking, Elayne Snare, MD  hydrocortisone (ANUSOL-HC) 2.5 % rectal cream Apply topically. 02/23/22  Yes [provider]  lamoTRIgine (LAMICTAL) 100 MG tablet Take 1 tablet (100 mg total) by mouth 2 (two) times daily. 03/01/22  Yes Cloria Spring, MD  loperamide (IMODIUM A-D) 2 MG tablet Take 1 tablet (2 mg total) by mouth daily. 03/07/22  Yes Annitta Needs, NP  naproxen sodium (ALEVE) 220 MG tablet Take 220 mg by mouth daily as needed (headache).   Yes [provider]  paliperidone (INVEGA) 9 MG 24 hr tablet Take 1 tablet (9 mg total) by mouth daily. 03/01/22  Yes Cloria Spring, MD  potassium chloride (KLOR-CON) 10 MEQ tablet TAKE 2 TABLETS (20MEQ) BY MOUTH TWICE DAILY WITH FOOD 03/14/22  Yes Kathyrn Drown, MD  propranolol (INDERAL) 10 MG tablet TAKE 1 TABLET BY MOUTH THREE TIMES A DAY. 01/31/22  Yes Luking, Elayne Snare, MD  RABEprazole (ACIPHEX) 20 MG tablet TAKE 1 TABLET BY MOUTH TWICE A DAY. Patient taking differently: Take 20 mg by mouth in the morning and at bedtime. 12/16/21 03/23/22 Yes Mahon, Lenise Arena, NP  rifaximin (XIFAXAN) 550 MG TABS tablet Take 550 mg by mouth 3 (three) times daily.  For 14 days  Start on 03/16/22   Yes [provider]  sertraline (ZOLOFT) 100 MG tablet Take 1 tablet (100 mg total) by mouth daily. 03/01/22  Yes Cloria Spring, MD  torsemide (DEMADEX) 20 MG tablet TAKE (1) TABLET BY MOUTH EACH MORNING. Patient taking differently: Take 20 mg by mouth daily. 01/31/22  Yes Kathyrn Drown, MD  traZODone (DESYREL) 100 MG tablet TAKE 2 TABLETS(200MG ) BY MOUTH AT BEDTIME. 03/01/22  Yes Cloria Spring, MD      Allergies    Thorazine [chlorpromazine hcl], Acetaminophen, Aspirin, Aspirin-acetaminophen-caffeine, Nsaids, Other,  Penicillins, and Tomato    Review of Systems   Review of Systems  Gastrointestinal:  Negative for abdominal pain.  Musculoskeletal:  Positive for extremity weakness.  All other systems reviewed and are negative.   Physical Exam Updated Vital Signs BP (!) 142/124   Pulse 77   Temp 97.9 F (36.6 C) (Oral)   Resp 17   Ht 5' 10.5" (1.791 m)   Wt 108.9 kg   SpO2 94%   BMI 33.95 kg/m  Physical Exam Vitals and nursing note reviewed.  Constitutional:      General: She is not in acute distress.    Appearance: She is well-developed.  HENT:     Head: Normocephalic and atraumatic.  Eyes:     Conjunctiva/sclera: Conjunctivae normal.  Cardiovascular:     Rate and Rhythm: Normal rate and regular rhythm.     Heart sounds: No murmur heard. Pulmonary:     Effort: Pulmonary effort is normal. No respiratory distress.     Breath sounds: Normal breath sounds.  Abdominal:     Palpations: Abdomen is soft.     Tenderness: There is no abdominal tenderness.  Musculoskeletal:        General: No swelling.     Cervical back: Neck supple.  Skin:    General: Skin is warm and dry.     Capillary Refill: Capillary refill takes less than 2 seconds.  Neurological:     General: No focal deficit present.     Mental Status: She is alert and oriented to person, place, and time.     Motor: No weakness.     Comments: Pt had from lower extremities,  Pt has equal strength bilat lower extremities   Psychiatric:        Mood and Affect: Mood normal.     ED Results / Procedures / Treatments   Labs (all labs ordered are listed, but only abnormal results are displayed) Labs Reviewed  BASIC METABOLIC PANEL - Abnormal; Notable for the following components:      Result Value   Chloride 97 (*)    Glucose, Bld 107 (*)    All other components within normal limits  CBC - Abnormal; Notable for the following components:   Platelets 149 (*)    All other components within normal limits  URINALYSIS, ROUTINE W  REFLEX MICROSCOPIC - Abnormal; Notable for the following components:   Color, Urine COLORLESS (*)    Specific Gravity, Urine 1.004 (*)    Hgb urine dipstick SMALL (*)    All other components within normal limits  CBG MONITORING, ED    EKG EKG Interpretation  Date/Time:  Thursday March 23 2022 13:27:55 EDT Ventricular Rate:  76 PR Interval:  176 QRS Duration: 84 QT Interval:  408 QTC Calculation: 459 R Axis:   74 Text Interpretation: Normal sinus rhythm Cannot rule out Anterior infarct , age undetermined Abnormal ECG When  compared with ECG of 23-Jan-2022 09:03, Nonspecific T wave abnormality no longer evident in Inferior leads No significant change since last tracing Confirmed by Lacretia Leigh ([id number]) on 03/24/2022 3:03:49 PM  Radiology No results found.  Procedures Procedures    Medications Ordered in ED Medications - No data to display  ED Course/ Medical Decision Making/ A&P                           Medical Decision Making Pt reports she is weak in her left leg.  Pt concerned that she had a stroke 4 days ago.   Amount and/or Complexity of Data Reviewed External Data Reviewed: notes.    Details: Primary care notes reviewed,   Labs: ordered. Decision-making details documented in ED Course.    Details: UA is negative,  platelets 148.  Labs ordered, reviewed and interpreted.  Radiology: ordered and independent interpretation performed. Decision-making details documented in ED Course.    Details: Ct head no acute abnormality MRI  no acute  cva.   ECG/medicine tests: ordered and independent interpretation performed. Decision-making details documented in ED Course.    Details: EKG   no acute abnormality    Risk Risk Details: I counseled pt on results.  Pt reexamined.  I suspect sciatica.  Pt advised to follow up with her MD for recheck.            Final Clinical Impression(s) / ED Diagnoses Final diagnoses:  Left leg pain  Sciatica of left side    Rx /  DC Orders ED Discharge Orders     None      An After Visit Summary was printed and given to the patient.    Fransico Meadow, [place] 03/26/22 1442    Fransico Meadow, MD 03/26/22 1500

## 2022-03-30 ENCOUNTER — Ambulatory Visit (HOSPITAL_COMMUNITY): Payer: Medicare Other

## 2022-04-06 ENCOUNTER — Ambulatory Visit (HOSPITAL_COMMUNITY): Payer: Medicare Other

## 2022-04-07 ENCOUNTER — Other Ambulatory Visit: Payer: Self-pay | Admitting: Family Medicine

## 2022-04-07 ENCOUNTER — Ambulatory Visit (INDEPENDENT_AMBULATORY_CARE_PROVIDER_SITE_OTHER): Payer: Medicare Other

## 2022-04-07 ENCOUNTER — Ambulatory Visit
Admission: EM | Admit: 2022-04-07 | Discharge: 2022-04-07 | Disposition: A | Discharge status: Home / Self Care | Payer: Medicare Other

## 2022-04-07 DIAGNOSIS — R0602 Shortness of breath: Secondary | ICD-10-CM

## 2022-04-07 DIAGNOSIS — J189 Pneumonia, unspecified organism: Secondary | ICD-10-CM

## 2022-04-07 DIAGNOSIS — R059 Cough, unspecified: Secondary | ICD-10-CM | POA: Diagnosis not present

## 2022-04-07 MED ORDER — CEFPODOXIME PROXETIL 200 MG PO TABS: 200.0000 mg | ORAL_TABLET | Freq: Two times a day (BID) | ORAL | 0 refills | Status: AC

## 2022-04-07 MED ORDER — DOXYCYCLINE HYCLATE 100 MG PO CAPS: 100.0000 mg | ORAL_CAPSULE | Freq: Two times a day (BID) | ORAL | 0 refills | Status: AC

## 2022-04-07 MED ORDER — ALBUTEROL SULFATE (2.5 MG/3ML) 0.083% IN NEBU
2.5000 mg | INHALATION_SOLUTION | Freq: Once | RESPIRATORY_TRACT | Status: AC
  Administered 2022-04-07: 2.5 mg via RESPIRATORY_TRACT

## 2022-04-07 NOTE — ED Provider Notes (Signed)
[facility]    CSN: [id number] Arrival date & time: 04/07/22  1608      History   Chief Complaint Chief Complaint  Patient presents with   Cough   Shortness of Breath    HPI [name] is a 61 y.o. female.   Patient presents for 3 weeks of body aches, congested cough, shortness of breath, wheezing, chest tightness, chest congestion, sore throat, abdominal pain.  Also reports decreased appetite.  She denies chest pain, shortness of breath at rest, headache, ear pain, ear drainage, nausea/vomiting.  She denies new rash.  Has been a bit more tired than normal.  Reports she has not been able to get a ride to be seen until today.  Has not take anything for symptoms so far.  Reports history of asthma for which she takes Trelegy daily.    Past Medical History:  Diagnosis Date   Anxiety    Aortic atherosclerosis ([place]) 09/19/2020   Seen on CAT scan 2021   Arthritis    Depression    Diabetes mellitus    Diabetes mellitus, type II ([place])    Dyspnea    Dysrhythmia    GERD (gastroesophageal reflux disease)    Heart murmur    HTN (hypertension)    Hyperglycemia    IBS (irritable bowel syndrome)    Lung nodules    right, followed by PCP, PET 11/2011   Mental retardation    MI (myocardial infarction) ([place])    Migraines    Pneumonia    Sleep apnea    cpap    Stroke [facility])     Patient Active Problem List   Diagnosis Date Noted   DOE (dyspnea on exertion) 01/06/2022   Chronic cough 01/06/2022   Chronic diarrhea 11/22/2021   Primary malignant neoplasm of right middle lobe of lung ([place]) 09/08/2021   Lung nodule 06/14/2021   S/P bronchoscopy with biopsy    Degenerative arthritis of right knee 11/04/2020   Osteoarthritis of right knee 11/04/2020   Aortic atherosclerosis ([place]) 46/27/0350   History of Helicobacter pylori infection 07/23/2020   S/P hysterectomy 04/19/2020   History of vaginal bleeding 04/19/2020   Fall in bathtub    Neck pain    Primary  osteoarthritis of both knees 01/27/2019   Hyperlipidemia 09/26/2017   Dysphagia 08/15/2016   Obstructive sleep apnea syndrome 06/27/2016   Rectal bleeding 04/07/2013   Melena 03/04/2013   Hematemesis 03/04/2013   Abdominal pain, epigastric 03/04/2013   Hypokalemia 03/04/2013   Smoker 01/16/2012   Diarrhea 12/26/2011   Carcinoid tumor of right lung    Abdominal pain 10/08/2011   Fatty liver 08/24/2011   Constipation 10/20/2010   NAUSEA AND VOMITING 06/09/2010   HEMATOCHEZIA 04/21/2010   OTHER DYSPHAGIA 04/21/2010   MILD MENTAL RETARDATION 02/17/2009   GLAUCOMA 02/17/2009   ANXIETY 01/15/2009   MIGRAINE HEADACHE 01/15/2009   Essential hypertension 01/15/2009   Hemorrhoids 01/15/2009   GASTROESOPHAGEAL REFLUX DISEASE, CHRONIC 01/15/2009   CONSTIPATION, CHRONIC 01/15/2009   IBS (irritable bowel syndrome) 01/15/2009   KNEE PAIN, CHRONIC 01/15/2009   BACK PAIN, CHRONIC 01/15/2009   History of colonic polyps 01/15/2009    Past Surgical History:  Procedure Laterality Date   ABDOMINAL HYSTERECTOMY     BIOPSY  01/15/2020   Procedure: BIOPSY;  Surgeon: Daneil Dolin, MD;  [facility];  Service: Endoscopy;;  gastric   BIOPSY  01/25/2022   Procedure: BIOPSY;  Surgeon: Daneil Dolin, MD;  [facility];  Service: Endoscopy;;  ascending;descending;sigmoid;   BRONCHIAL BIOPSY  06/14/2021   Procedure: BRONCHIAL BIOPSIES;  Surgeon: Garner Nash, DO;  Location: [facility];  Service: Pulmonary;;   BRONCHIAL BRUSHINGS  06/14/2021   Procedure: BRONCHIAL BRUSHINGS;  Surgeon: Garner Nash, DO;  Location: [facility];  Service: Pulmonary;;   BRONCHIAL NEEDLE ASPIRATION BIOPSY  06/14/2021   Procedure: BRONCHIAL NEEDLE ASPIRATION BIOPSIES;  Surgeon: Garner Nash, DO;  Location: [facility];  Service: Pulmonary;;   COLONOSCOPY  11/2007   hyperplastic polyps, prior hx of adenomas    COLONOSCOPY  05/2010   incomplete due to poor prep, hyperplastic rectal  polyp   COLONOSCOPY  05/05/2002   Dimunitive polyps in the rectum and left colon, cold    biopsied/removed.  Scattered few left-sided diverticula.  Regular colonic   mucosa appeared normal   COLONOSCOPY N/A 05/28/2013   Rourk: mulitple tubular adenomas removed. next tcs 05/2016   COLONOSCOPY WITH PROPOFOL N/A 09/07/2016   Dr. Gala Romney: For hyperplastic polyps removed. Next colonoscopy March 2023 given history of adenomatous colon polyps in the past.   COLONOSCOPY WITH PROPOFOL N/A 01/25/2022   Redundant/elongated colon, melanosis coli, left-sided diverticula, multiple colon polyps, colonic lipoma. Negative active colitis, adenomas. 3 year surveillance.   ESOPHAGOGASTRODUODENOSCOPY  08/2007   moderate sized hiatal hernia   ESOPHAGOGASTRODUODENOSCOPY  05/2010   noncritical schatzki ring s/p 37F   ESOPHAGOGASTRODUODENOSCOPY (EGD) WITH ESOPHAGEAL DILATION N/A 02/06/2013   [id number] esophagus-s/p dilation up to a 31 [place] size with [facility] dilators.  Hiatal hernia   ESOPHAGOGASTRODUODENOSCOPY (EGD) WITH PROPOFOL N/A 09/07/2016   Dr. Gala Romney: Normal, status post empiric dilation of the esophagus for history of dysphagia   ESOPHAGOGASTRODUODENOSCOPY (EGD) WITH PROPOFOL N/A 05/02/2019   Normal esophagus s/p dilation, normal stomach, normal duodenum   ESOPHAGOGASTRODUODENOSCOPY (EGD) WITH PROPOFOL N/A 01/15/2020   normal esophagus s/p dilation, gastric nodule s/p biopsy. This showed reactive gastropathy with H.pylori.    EXTERNAL EAR SURGERY     bilateral   FIDUCIAL MARKER PLACEMENT  06/14/2021   Procedure: FIDUCIAL MARKER PLACEMENT;  Surgeon: Garner Nash, DO;  Location: [facility];  Service: Pulmonary;;   FOOT SURGERY     GLAUCOMA SURGERY     KNEE ARTHROPLASTY Right 11/04/2020   Procedure: COMPUTER ASSISTED TOTAL KNEE ARTHROPLASTY;  Surgeon: Rod Can, MD;  Location: [facility];  Service: Orthopedics;  Laterality: Right;   MALONEY DILATION N/A 09/07/2016   Procedure: Venia Minks DILATION;   Surgeon: Daneil Dolin, MD;  [facility];  Service: Endoscopy;  Laterality: N/A;   MALONEY DILATION N/A 05/02/2019   Procedure: Venia Minks DILATION;  Surgeon: Daneil Dolin, MD;  [facility];  Service: Endoscopy;  Laterality: N/A;   MALONEY DILATION N/A 01/15/2020   Procedure: Venia Minks DILATION;  Surgeon: Daneil Dolin, MD;  [facility];  Service: Endoscopy;  Laterality: N/A;   POLYPECTOMY  09/07/2016   Procedure: POLYPECTOMY;  Surgeon: Daneil Dolin, MD;  [facility];  Service: Endoscopy;;  sigmoid colon x4   POLYPECTOMY  01/25/2022   Procedure: POLYPECTOMY;  Surgeon: Daneil Dolin, MD;  [facility];  Service: Endoscopy;;  hepatic flexure and descending/sigmoid colon   small bowel capsule  10/2007   normal   VIDEO BRONCHOSCOPY WITH RADIAL ENDOBRONCHIAL ULTRASOUND  06/14/2021   Procedure: VIDEO BRONCHOSCOPY WITH RADIAL ENDOBRONCHIAL ULTRASOUND;  Surgeon: Garner Nash, DO;  Location: [place];  Service: Pulmonary;;    OB History  Gravida  3   Para      Term      Preterm      AB  2   Living  0      SAB      IAB      Ectopic  1   Multiple      Live Births               Home Medications    Prior to Admission medications   Medication Sig Start Date End Date Taking? Authorizing Provider  cefpodoxime (VANTIN) 200 MG tablet Take 1 tablet (200 mg total) by mouth 2 (two) times daily for 5 days. 04/07/22 04/12/22 Yes Eulogio Bear, NP  doxycycline (VIBRAMYCIN) 100 MG capsule Take 1 capsule (100 mg total) by mouth 2 (two) times daily for 7 days. 04/07/22 04/14/22 Yes Eulogio Bear, NP  atorvastatin (LIPITOR) 10 MG tablet Take 1 tablet (10 mg total) by mouth daily. 03/14/22   Kathyrn Drown, MD  benztropine (COGENTIN) 1 MG tablet Take 1 tablet (1 mg total) by mouth daily. 03/01/22   Cloria Spring, MD  cycloSPORINE (RESTASIS) 0.05 % ophthalmic emulsion Place 1 drop into both eyes 2 (two) times  daily.    [provider]  dicyclomine (BENTYL) 10 MG capsule TAKE 1 CAPSULE BY MOUTH UP TO 3 TIMES DAILY BEFORE MEALS AS NEEDED FOR ABDOMINAL CRAMPS/LOOSE STOOLS. Patient taking differently: Take 10 mg by mouth 3 (three) times daily as needed (ABDOMINAL CRAMPS/LOOSE STOOLS.). 01/17/22   Annitta Needs, NP  famotidine (PEPCID) 20 MG tablet Take 1 tablet (20 mg total) by mouth 2 (two) times daily. 01/06/22   Cobb, Karie Schwalbe, NP  Fluticasone-Umeclidin-Vilant (TRELEGY ELLIPTA) 100-62.5-25 MCG/ACT AEPB Inhale 1 puff into the lungs daily. 01/06/22   Cobb, Karie Schwalbe, NP  GOODSENSE HEMORRHOIDAL 0.25-88.44 % suppository UNWRAP AND PLACE 1 SUPPOSITORY RECTALLY 2 TIMES DAILY AS NEEDED FOR HEMORRHOIDS OR ANAL ITCHING Patient taking differently: Place 1 suppository rectally 2 (two) times daily as needed for hemorrhoids. 01/23/22   Kathyrn Drown, MD  hydrocortisone (ANUSOL-HC) 2.5 % rectal cream Apply topically. 02/23/22   [provider]  hydrocortisone (ANUSOL-HC) 25 MG suppository Place 25 mg rectally 2 (two) times daily. 03/24/22   [provider]  lamoTRIgine (LAMICTAL) 100 MG tablet Take 1 tablet (100 mg total) by mouth 2 (two) times daily. 03/01/22   Cloria Spring, MD  loperamide (IMODIUM A-D) 2 MG tablet Take 1 tablet (2 mg total) by mouth daily. 03/07/22   Annitta Needs, NP  naproxen sodium (ALEVE) 220 MG tablet Take 220 mg by mouth daily as needed (headache).    [provider]  paliperidone (INVEGA) 9 MG 24 hr tablet Take 1 tablet (9 mg total) by mouth daily. 03/01/22   Cloria Spring, MD  potassium chloride (KLOR-CON) 10 MEQ tablet TAKE 2 TABLETS (20MEQ) BY MOUTH TWICE DAILY WITH FOOD 03/14/22   Kathyrn Drown, MD  propranolol (INDERAL) 10 MG tablet TAKE 1 TABLET BY MOUTH THREE TIMES A DAY. 01/31/22   Kathyrn Drown, MD  RABEprazole (ACIPHEX) 20 MG tablet TAKE 1 TABLET BY MOUTH TWICE A DAY. Patient taking differently: Take 20 mg by mouth in the morning and at bedtime.  12/16/21 03/23/22  Sherron Monday, NP  rifaximin (XIFAXAN) 550 MG TABS tablet Take 550 mg by mouth 3 (three) times daily. For 14 days  Start on 03/16/22    [provider]  sertraline (ZOLOFT)  100 MG tablet Take 1 tablet (100 mg total) by mouth daily. 03/01/22   Cloria Spring, MD  torsemide (DEMADEX) 20 MG tablet TAKE (1) TABLET BY MOUTH EACH MORNING. Patient taking differently: Take 20 mg by mouth daily. 01/31/22   Kathyrn Drown, MD  traZODone (DESYREL) 100 MG tablet TAKE 2 TABLETS(200MG ) BY MOUTH AT BEDTIME. 03/01/22   Cloria Spring, MD    Family History Family History  Problem Relation Age of Onset   Stroke Mother    Heart attack Father    Schizophrenia Other    Drug abuse Other    Alcohol abuse Other    Colon cancer Other        aunt   Obesity Other    COPD Other    Anxiety disorder Other    GER disease Other    Diabetes type II Other    Anxiety disorder Other    Depression Other    Depression Sister    Schizophrenia Sister    Liver disease Neg Hx    Inflammatory bowel disease Neg Hx     Social History Social History   Tobacco Use   Smoking status: Every Day    Packs/day: 0.50    Years: 40.00    Total pack years: 20.00    Types: Cigarettes    Passive exposure: Current   Smokeless tobacco: Never  Vaping Use   Vaping Use: Never used  Substance Use Topics   Alcohol use: No   Drug use: No     Allergies   Thorazine [chlorpromazine hcl], Acetaminophen, Aspirin, Aspirin-acetaminophen-caffeine, Nsaids, Other, Penicillins, and Tomato   Review of Systems Review of Systems Per HPI  Physical Exam Triage Vital Signs ED Triage Vitals  Enc Vitals Group     BP 04/07/22 1614 (!) 153/82     Pulse Rate 04/07/22 1614 80     Resp 04/07/22 1614 (!) 22     Temp 04/07/22 1614 97.9 F (36.6 C)     Temp Source 04/07/22 1614 Oral     SpO2 04/07/22 1614 91 %     Weight --      Height --      Head Circumference --      Peak Flow --      Pain Score 04/07/22  1618 0     Pain Loc --      Pain Edu? --      Excl. in [place]? --    No data found.  Updated Vital Signs BP (!) 153/82 (BP Location: Right Arm)   Pulse 85   Temp 97.9 F (36.6 C) (Oral)   Resp (!) 22   SpO2 94%   Visual Acuity Right Eye Distance:   Left Eye Distance:   Bilateral Distance:    Right Eye Near:   Left Eye Near:    Bilateral Near:     Physical Exam Vitals and nursing note reviewed.  Constitutional:      General: She is not in acute distress.    Appearance: She is well-developed. She is not toxic-appearing.  HENT:     Head: Normocephalic and atraumatic.     Mouth/Throat:     Mouth: Mucous membranes are moist.     Pharynx: No pharyngeal swelling or oropharyngeal exudate.  Eyes:     Extraocular Movements: Extraocular movements intact.     Pupils: Pupils are equal, round, and reactive to light.  Cardiovascular:     Rate and Rhythm: Normal rate and regular rhythm.  Pulmonary:     Effort: Pulmonary effort is normal.     Breath sounds: Examination of the right-middle field reveals rhonchi. Examination of the right-lower field reveals decreased breath sounds. Examination of the left-lower field reveals decreased breath sounds. Decreased breath sounds and rhonchi present. No wheezing.  Skin:    General: Skin is warm and dry.     Capillary Refill: Capillary refill takes less than 2 seconds.     Coloration: Skin is not jaundiced or pale.     Findings: No erythema.  Neurological:     Mental Status: She is alert and oriented to person, place, and time.  Psychiatric:        Behavior: Behavior is cooperative.      UC Treatments / Results  Labs (all labs ordered are listed, but only abnormal results are displayed) Labs Reviewed - No data to display  EKG   Radiology DG Chest 2 View  Result Date: 04/07/2022 CLINICAL DATA:  Cough, congestion, shortness of breath and history of asthma. EXAM: CHEST - 2 VIEW COMPARISON:  06/15/2021 FINDINGS: The heart size and  mediastinal contours are within normal limits. Bronchial thickening in both perihilar regions and extending into the lower lungs bilaterally. Additional density in the right lower lung and right lower lobe may represent developing pneumonia. No pulmonary edema, pneumothorax or pleural fluid. The visualized skeletal structures are unremarkable. IMPRESSION: Bronchial thickening in both perihilar regions and extending into the lower lungs bilaterally suggestive of bronchitis. Additional density in the right lower lung may represent developing pneumonia. Electronically Signed   By: Aletta Edouard M.D.   On: 04/07/2022 16:37    Procedures Procedures (including critical care time)  Medications Ordered in UC Medications  albuterol (PROVENTIL) (2.5 MG/3ML) 0.083% nebulizer solution 2.5 mg (2.5 mg Nebulization Given 04/07/22 1640)    Initial Impression / Assessment and Plan / UC Course  I have reviewed the triage vital signs and the nursing notes.  Pertinent labs & imaging results that were available during my care of the patient were reviewed by me and considered in my medical decision making (see chart for details).    Patient is well-appearing, afebrile, not tachycardic, oxygenating well on room air.  She is slightly hypertensive and tachypenic, likely secondary to acute illness/asthma exacerbation.  Albuterol breathing treatment given with increase in oxygenation to 94% on room air and tachypnea improved.  Chest x-ray today shows lower lobe bronchitis and pneumonia of right lower lung.  We will start patient on cefpodoxime twice daily for 5 days along with doxycycline twice daily for 7 days.  Supportive care discussed.  Strict return and ER precautions discussed.  Recommended close follow-up with primary care provider if symptoms not better.  If symptoms worsen, go to ER or call 911.  The patient was given the opportunity to ask questions.  All questions answered to their satisfaction.  The patient is in  agreement to this plan.    Final Clinical Impressions(s) / UC Diagnoses   Final diagnoses:  Community acquired pneumonia of right lower lobe of lung     Discharge Instructions      The chest x-ray shows you may be developing pneumonia.  Please start on both of the antibiotics and take them as prescribed to treat this.    Follow-up with your primary care provider within 6 weeks to recheck a chest x-ray and make sure the pneumonia is better.  If your symptoms worsen or you develop chest pain, shortness of breath, or sudden  onset of difficulty breathing, call 911.    ED Prescriptions     Medication Sig Dispense Auth. Provider   cefpodoxime (VANTIN) 200 MG tablet Take 1 tablet (200 mg total) by mouth 2 (two) times daily for 5 days. 10 tablet Noemi Chapel A, NP   doxycycline (VIBRAMYCIN) 100 MG capsule Take 1 capsule (100 mg total) by mouth 2 (two) times daily for 7 days. 14 capsule Eulogio Bear, NP      PDMP not reviewed this encounter.   Eulogio Bear, NP 04/07/22 1659

## 2022-04-07 NOTE — Discharge Instructions (Addendum)
The chest x-ray shows you may be developing pneumonia.  Please start on both of the antibiotics and take them as prescribed to treat this.    Follow-up with your primary care provider within 6 weeks to recheck a chest x-ray and make sure the pneumonia is better.  If your symptoms worsen or you develop chest pain, shortness of breath, or sudden onset of difficulty breathing, call 911.

## 2022-04-07 NOTE — ED Triage Notes (Signed)
Pt reports nasal congestion, shortness of breath and cough x 3 weeks. Reports she has asthma  flare up.

## 2022-04-11 ENCOUNTER — Ambulatory Visit: Payer: Medicare Other | Admitting: Gastroenterology

## 2022-04-12 ENCOUNTER — Ambulatory Visit: Payer: Medicare Other | Admitting: Pulmonary Disease

## 2022-04-19 ENCOUNTER — Ambulatory Visit (INDEPENDENT_AMBULATORY_CARE_PROVIDER_SITE_OTHER): Payer: Medicare Other | Admitting: Gastroenterology

## 2022-04-19 ENCOUNTER — Encounter: Payer: Self-pay | Admitting: Gastroenterology

## 2022-04-19 VITALS — Ht 70.5 in | Wt 250.0 lb

## 2022-04-19 DIAGNOSIS — R197 Diarrhea, unspecified: Secondary | ICD-10-CM

## 2022-04-19 MED ORDER — HYDROCORTISONE (PERIANAL) 2.5 % EX CREA: TOPICAL_CREAM | CUTANEOUS | 99 refills | Status: DC

## 2022-04-19 NOTE — Patient Instructions (Addendum)
Let's take dicyclomine twice a day.   I have refilled the hemorrhoid cream.  Please call us if you still have to strain or you have more stools a day.  We will see you in 6 weeks!  Annitta Needs, PhD, ANP-BC [facility]

## 2022-04-19 NOTE — Progress Notes (Signed)
Primary Care Physician:  Kathyrn Drown, MD  Primary GI: Dr. Gala Romney   Patient Location: Home   Provider Location: [facility] office   Reason for Visit: Follow-up   Persons present on the virtual encounter, with roles: Patient and NP   Total time (minutes) spent on medical discussion: 10 minutes   Due to COVID-19, visit was conducted using virtual method.  Visit was requested by patient.  Virtual Visit via Telephone Note (VIDEO UNAVAILABLE) Due to COVID-19, visit is conducted virtually and was requested by patient.   I connected with [name] on 04/19/22 at 10:30 AM EDT by telephone and verified that I am speaking with the correct person using two identifiers.   I discussed the limitations, risks, security and privacy concerns of performing an evaluation and management service by telephone and the availability of in person appointments. I also discussed with the patient that there may be a patient responsible charge related to this service. The patient expressed understanding and agreed to proceed.  Chief Complaint  Patient presents with   Follow-up    Patient being seen today due to having issues with hemorrhoids. She says she is still having issues with them as they are painful and itch. She has been seeing dark stools, and bright red blood at times. She has been using anusol suppositories and the anusol cream. She is in need of refills on both of these. She is having issues with constipation. She is not taking anything for the constipation.     History of Present Illness: 61 y.o. female presenting today in follow-up with a history of GERD, IBS, dysphagia. She also had H.pylori on biopsies and needs H.pylori eradication in future. Found to have carcinoid tumor right lung in Dec 2022 and has undergone SBRT with radiation Oncology. Surveillance colonoscopy recently with multiple colon polyps, colonic lipoma, negative stool studies and negative microscopic colitis. Needs surveillance  in 2026.   Difficult historian. Unable to come to clinic today as elevator broke down and can't come to the appointment. Has been down for 4 days.   Needs refills on hemorrhoid cream.   Still taking dicyclomine. Not taking Imodium. Took Xifaxan. Unclear if improved. Stool is like milkshake. Also states she is constipated every day. Then states she is having lots of stool every day, every time she urinates. Brown stools. She states she has diarrhea but then states she is constipated.   Past Medical History:  Diagnosis Date   Anxiety    Aortic atherosclerosis ([place]) 09/19/2020   Seen on CAT scan 2021   Arthritis    Depression    Diabetes mellitus    Diabetes mellitus, type II ([place])    Dyspnea    Dysrhythmia    GERD (gastroesophageal reflux disease)    Heart murmur    HTN (hypertension)    Hyperglycemia    IBS (irritable bowel syndrome)    Lung nodules    right, followed by PCP, PET 11/2011   Mental retardation    MI (myocardial infarction) ([place])    Migraines    Pneumonia    Sleep apnea    cpap    Stroke [facility])      Past Surgical History:  Procedure Laterality Date   ABDOMINAL HYSTERECTOMY     BIOPSY  01/15/2020   Procedure: BIOPSY;  Surgeon: Daneil Dolin, MD;  [facility];  Service: Endoscopy;;  gastric   BIOPSY  01/25/2022   Procedure: BIOPSY;  Surgeon: Daneil Dolin,  MD;  [facility];  Service: Endoscopy;;  ascending;descending;sigmoid;   BRONCHIAL BIOPSY  06/14/2021   Procedure: BRONCHIAL BIOPSIES;  Surgeon: Garner Nash, DO;  Location: [place] [facility];  Service: Pulmonary;;   BRONCHIAL BRUSHINGS  06/14/2021   Procedure: BRONCHIAL BRUSHINGS;  Surgeon: Garner Nash, DO;  Location: [place] [facility];  Service: Pulmonary;;   BRONCHIAL NEEDLE ASPIRATION BIOPSY  06/14/2021   Procedure: BRONCHIAL NEEDLE ASPIRATION BIOPSIES;  Surgeon: Garner Nash, DO;  Location: [facility];  Service: Pulmonary;;   COLONOSCOPY  11/2007   hyperplastic polyps,  prior hx of adenomas    COLONOSCOPY  05/2010   incomplete due to poor prep, hyperplastic rectal polyp   COLONOSCOPY  05/05/2002   Dimunitive polyps in the rectum and left colon, cold    biopsied/removed.  Scattered few left-sided diverticula.  Regular colonic   mucosa appeared normal   COLONOSCOPY N/A 05/28/2013   Rourk: mulitple tubular adenomas removed. next tcs 05/2016   COLONOSCOPY WITH PROPOFOL N/A 09/07/2016   Dr. Gala Romney: For hyperplastic polyps removed. Next colonoscopy March 2023 given history of adenomatous colon polyps in the past.   COLONOSCOPY WITH PROPOFOL N/A 01/25/2022   Redundant/elongated colon, melanosis coli, left-sided diverticula, multiple colon polyps, colonic lipoma. Negative active colitis, adenomas. 3 year surveillance.   ESOPHAGOGASTRODUODENOSCOPY  08/2007   moderate sized hiatal hernia   ESOPHAGOGASTRODUODENOSCOPY  05/2010   noncritical schatzki ring s/p 73F   ESOPHAGOGASTRODUODENOSCOPY (EGD) WITH ESOPHAGEAL DILATION N/A 02/06/2013   GUY:QIHKVQ esophagus-s/p dilation up to a 52 [place] size with [facility] dilators.  Hiatal hernia   ESOPHAGOGASTRODUODENOSCOPY (EGD) WITH PROPOFOL N/A 09/07/2016   Dr. Gala Romney: Normal, status post empiric dilation of the esophagus for history of dysphagia   ESOPHAGOGASTRODUODENOSCOPY (EGD) WITH PROPOFOL N/A 05/02/2019   Normal esophagus s/p dilation, normal stomach, normal duodenum   ESOPHAGOGASTRODUODENOSCOPY (EGD) WITH PROPOFOL N/A 01/15/2020   normal esophagus s/p dilation, gastric nodule s/p biopsy. This showed reactive gastropathy with H.pylori.    EXTERNAL EAR SURGERY     bilateral   FIDUCIAL MARKER PLACEMENT  06/14/2021   Procedure: FIDUCIAL MARKER PLACEMENT;  Surgeon: Garner Nash, DO;  Location: [place] [facility];  Service: Pulmonary;;   FOOT SURGERY     GLAUCOMA SURGERY     KNEE ARTHROPLASTY Right 11/04/2020   Procedure: COMPUTER ASSISTED TOTAL KNEE ARTHROPLASTY;  Surgeon: Rod Can, MD;  [facility]: [facility];  Service:  Orthopedics;  Laterality: Right;   MALONEY DILATION N/A 09/07/2016   Procedure: Venia Minks DILATION;  Surgeon: Daneil Dolin, MD;  [facility];  Service: Endoscopy;  Laterality: N/A;   MALONEY DILATION N/A 05/02/2019   Procedure: Venia Minks DILATION;  Surgeon: Daneil Dolin, MD;  [facility];  Service: Endoscopy;  Laterality: N/A;   MALONEY DILATION N/A 01/15/2020   Procedure: Venia Minks DILATION;  Surgeon: Daneil Dolin, MD;  [facility];  Service: Endoscopy;  Laterality: N/A;   POLYPECTOMY  09/07/2016   Procedure: POLYPECTOMY;  Surgeon: Daneil Dolin, MD;  [facility];  Service: Endoscopy;;  sigmoid colon x4   POLYPECTOMY  01/25/2022   Procedure: POLYPECTOMY;  Surgeon: Daneil Dolin, MD;  [facility];  Service: Endoscopy;;  hepatic flexure and descending/sigmoid colon   small bowel capsule  10/2007   normal   VIDEO BRONCHOSCOPY WITH RADIAL ENDOBRONCHIAL ULTRASOUND  06/14/2021   Procedure: VIDEO BRONCHOSCOPY WITH RADIAL ENDOBRONCHIAL ULTRASOUND;  Surgeon: Garner Nash, DO;  Location: [place];  Service: Pulmonary;;  Current Meds  Medication Sig   atorvastatin (LIPITOR) 10 MG tablet Take 1 tablet (10 mg total) by mouth daily.   benztropine (COGENTIN) 1 MG tablet Take 1 tablet (1 mg total) by mouth daily.   cycloSPORINE (RESTASIS) 0.05 % ophthalmic emulsion Place 1 drop into both eyes 2 (two) times daily.   dicyclomine (BENTYL) 10 MG capsule TAKE 1 CAPSULE BY MOUTH UP TO 3 TIMES DAILY BEFORE MEALS AS NEEDED FOR ABDOMINAL CRAMPS/LOOSE STOOLS. (Patient taking differently: Take 10 mg by mouth 3 (three) times daily as needed (ABDOMINAL CRAMPS/LOOSE STOOLS.).)   famotidine (PEPCID) 20 MG tablet Take 1 tablet (20 mg total) by mouth 2 (two) times daily.   Fluticasone-Umeclidin-Vilant (TRELEGY ELLIPTA) 100-62.5-25 MCG/ACT AEPB Inhale 1 puff into the lungs daily.   GOODSENSE HEMORRHOIDAL 0.25-88.44 % suppository UNWRAP AND PLACE 1  SUPPOSITORY RECTALLY 2 TIMES DAILY AS NEEDED FOR HEMORRHOIDS OR ANAL ITCHING (Patient taking differently: Place 1 suppository rectally 2 (two) times daily as needed for hemorrhoids.)   hydrocortisone (ANUSOL-HC) 2.5 % rectal cream PLACE 1 APPLICATION RECTALLY 2 TIMES DAILY.   hydrocortisone (ANUSOL-HC) 25 MG suppository Place 25 mg rectally 2 (two) times daily.   lamoTRIgine (LAMICTAL) 100 MG tablet Take 1 tablet (100 mg total) by mouth 2 (two) times daily.   loperamide (IMODIUM A-D) 2 MG tablet Take 1 tablet (2 mg total) by mouth daily. (Patient taking differently: Take 2 mg by mouth daily. As needed diarrhea)   naproxen sodium (ALEVE) 220 MG tablet Take 220 mg by mouth daily as needed (headache).   paliperidone (INVEGA) 9 MG 24 hr tablet Take 1 tablet (9 mg total) by mouth daily.   potassium chloride (KLOR-CON) 10 MEQ tablet TAKE 2 TABLETS (20MEQ) BY MOUTH TWICE DAILY WITH FOOD   propranolol (INDERAL) 10 MG tablet TAKE 1 TABLET BY MOUTH THREE TIMES A DAY.   RABEprazole (ACIPHEX) 20 MG tablet TAKE 1 TABLET BY MOUTH TWICE A DAY. (Patient taking differently: Take 20 mg by mouth in the morning and at bedtime.)   torsemide (DEMADEX) 20 MG tablet TAKE (1) TABLET BY MOUTH EACH MORNING. (Patient taking differently: Take 20 mg by mouth daily.)   traZODone (DESYREL) 100 MG tablet TAKE 2 TABLETS(200MG ) BY MOUTH AT BEDTIME.     Family History  Problem Relation Age of Onset   Stroke Mother    Heart attack Father    Schizophrenia Other    Drug abuse Other    Alcohol abuse Other    Colon cancer Other        aunt   Obesity Other    COPD Other    Anxiety disorder Other    GER disease Other    Diabetes type II Other    Anxiety disorder Other    Depression Other    Depression Sister    Schizophrenia Sister    Liver disease Neg Hx    Inflammatory bowel disease Neg Hx     Social History   Socioeconomic History   Marital status: Divorced    Spouse name: Not on file   Number of children: Not on  file   Years of education: Not on file   Highest education level: High school graduate  Occupational History   Occupation: disabled    Employer: UNEMPLOYED  Tobacco Use   Smoking status: Every Day    Packs/day: 0.50    Years: 40.00    Total pack years: 20.00    Types: Cigarettes    Passive exposure: Current   Smokeless  tobacco: Never  Vaping Use   Vaping Use: Never used  Substance and Sexual Activity   Alcohol use: No   Drug use: No   Sexual activity: Not Currently    Partners: Male    Birth control/protection: Surgical    Comment: hyst  Other Topics Concern   Not on file  Social History Narrative   Not on file   Social Determinants of Health   Financial Resource Strain: Low Risk  (04/26/2021)   Overall Financial Resource Strain (CARDIA)    Difficulty of Paying Living Expenses: Not hard at all  Food Insecurity: No Food Insecurity (04/26/2021)   Hunger Vital Sign    Worried About Running Out of Food in the Last Year: Never true    Ran Out of Food in the Last Year: Never true  Transportation Needs: No Transportation Needs (04/26/2021)   PRAPARE - Hydrologist (Medical): No    Lack of Transportation (Non-Medical): No  Physical Activity: Sufficiently Active (04/26/2021)   Exercise Vital Sign    Days of Exercise per Week: 5 days    Minutes of Exercise per Session: 40 min  Stress: No Stress Concern Present (04/26/2021)   [place]    Feeling of Stress : Only a little  Social Connections: Moderately Isolated (04/19/2020)   Social Connection and Isolation Panel [NHANES]    Frequency of Communication with Friends and Family: More than three times a week    Frequency of Social Gatherings with Friends and Family: More than three times a week    Attends Religious Services: 1 to 4 times per year    Active Member of [employer] or Organizations: No    Attends Archivist Meetings:  Never    Marital Status: Separated       Review of Systems: Gen: Denies fever, chills, anorexia. Denies fatigue, weakness, weight loss.  CV: Denies chest pain, palpitations, syncope, peripheral edema, and claudication. Resp: Denies dyspnea at rest, cough, wheezing, coughing up blood, and pleurisy. GI: see HPI Derm: Denies rash, itching, dry skin Psych: Denies depression, anxiety, memory loss, confusion. No homicidal or suicidal ideation.  Heme: Denies bruising, bleeding, and enlarged lymph nodes.  Observations/Objective: No distress. Unable to perform physical exam due to telephone encounter.   Assessment and Plan: 61 y.o. 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  Difficult historian. She is reporting diarrhea but also straining. Will decrease dicyclomine to BID for now, as it is difficult to tell what is going on. Will need close in-person follow-up. Anusol cream refilled.   Return for close follow-up in 6 weeks.    Follow Up Instructions:    I discussed the assessment and treatment plan with the patient. The patient was provided an opportunity to ask questions and all were answered. The patient agreed with the plan and demonstrated an understanding of the instructions.   The patient was advised to call back or seek an in-person evaluation if the symptoms worsen or if the condition fails to improve as anticipated.  I provided 10 minutes of time during this telephone visit.   Annitta Needs, PhD, ANP-BC [facility] Gastroenterology

## 2022-04-21 ENCOUNTER — Other Ambulatory Visit: Payer: Self-pay | Admitting: Family Medicine

## 2022-04-21 ENCOUNTER — Other Ambulatory Visit: Payer: Self-pay | Admitting: Gastroenterology

## 2022-04-24 ENCOUNTER — Other Ambulatory Visit: Payer: Self-pay

## 2022-04-24 ENCOUNTER — Ambulatory Visit (INDEPENDENT_AMBULATORY_CARE_PROVIDER_SITE_OTHER): Payer: Medicare Other | Admitting: Family Medicine

## 2022-04-24 VITALS — BP 138/83 | HR 90 | Temp 97.7°F | Ht 70.5 in | Wt 248.0 lb

## 2022-04-24 DIAGNOSIS — Z23 Encounter for immunization: Secondary | ICD-10-CM | POA: Diagnosis not present

## 2022-04-24 DIAGNOSIS — E1169 Type 2 diabetes mellitus with other specified complication: Secondary | ICD-10-CM

## 2022-04-24 DIAGNOSIS — E785 Hyperlipidemia, unspecified: Secondary | ICD-10-CM

## 2022-04-24 DIAGNOSIS — E119 Type 2 diabetes mellitus without complications: Secondary | ICD-10-CM

## 2022-04-24 DIAGNOSIS — Z8701 Personal history of pneumonia (recurrent): Secondary | ICD-10-CM | POA: Diagnosis not present

## 2022-04-24 DIAGNOSIS — G4733 Obstructive sleep apnea (adult) (pediatric): Secondary | ICD-10-CM | POA: Diagnosis not present

## 2022-04-24 DIAGNOSIS — I10 Essential (primary) hypertension: Secondary | ICD-10-CM

## 2022-04-24 NOTE — Progress Notes (Signed)
   Subjective:    Patient ID: [name], female    DOB: Mar 07, 1961, 61 y.o.   MRN: [id number]  HPI  Follow up for pneumonia c/o difficulty breathing, throat is sore, hoarseness Wants another neb tx - was [facility] at [facility]  -- order for BP cuff request -- order for CPAP used to be on it years ago She is concerned about recent respiratory status.  Also has history of lung cancer supposed to see pulmonary in the near future Review of Systems     Objective:   Physical Exam  Lungs are clear hearts regular HEENT benign      Assessment & Plan:  1. Essential hypertension Blood pressure decent control continue current measures And is also requesting a blood pressure cuff we will see if [facility] can get this cuff for them - Hemoglobin A1c - Lipid panel - Hepatic Function Panel - Basic metabolic panel - AMB Referral to [place] Management  2. Hyperlipidemia associated with type 2 diabetes mellitus (HCC) Check cholesterol - Hemoglobin A1c - Lipid panel - Hepatic Function Panel - Basic metabolic panel - AMB Referral to [place] [facility]  3. Diabetes mellitus without complication (HCC) Q1J in the past was diabetic recently doing better - Hemoglobin A1c - Lipid panel - Hepatic Function Panel - Basic metabolic panel - AMB Referral to [place] Management  4. History of bacterial pneumonia Follow-up chest x-ray in a few weeks time - DG Chest 2 View - AMB Referral to [place] [facility]  5. Immunization due Flu shot today - Flu Vaccine QUAD 25mo+IM (Fluarix, Fluzone & Alfiuria Quad PF)  As for CPAP we will send message to pulmonary and they can evaluate her further when she does her follow-up and decide if she needs to have another sleep study

## 2022-04-25 ENCOUNTER — Telehealth: Payer: Self-pay | Admitting: *Deleted

## 2022-04-25 NOTE — Chronic Care Management (AMB) (Signed)
  Care Coordination  Outreach Note  04/25/2022 Name: [name] MRN: [id number] DOB: Apr 19, 1961   Care Coordination Outreach Attempts: An unsuccessful telephone outreach was attempted today to offer the patient information about available care coordination services as a benefit of their health plan.   Follow Up Plan:  Additional outreach attempts will be made to offer the patient care coordination information and services.   Encounter Outcome:  No Answer  Julian Hy, [place] Direct Dial: [phone]

## 2022-04-26 ENCOUNTER — Telehealth: Payer: Self-pay | Admitting: Family Medicine

## 2022-04-26 NOTE — Telephone Encounter (Signed)
Patient social service worker states she needs a new CPAP machine She had a sleep study in 2019 Hopefully that will suffice Auto CPAP 10-20 pressure It is important to have [place] try to get this if for some reason needs a new sleep study then let us know In addition to this make sure social service worker is aware that the patient will need to utilize her CPAP machine at least 4 hours every single night otherwise insurance company will confiscated Once she restarts the CPAP machine she will need to follow-up with [place] within 1 month

## 2022-04-28 NOTE — Telephone Encounter (Signed)
CPAP script written out and placed on provider door for signature. [name] is out of the office until 05/02/22; left message to return call when she returns. Will send script to [place] once signed. Please advise. Thank you

## 2022-05-01 NOTE — Telephone Encounter (Signed)
This was signed and returned to the nurses station thank you

## 2022-05-01 NOTE — Chronic Care Management (AMB) (Signed)
  Care Coordination  Outreach Note  05/01/2022 Name: [name] MRN: [id number] DOB: April 13, 1961   Care Coordination Outreach Attempts: A second unsuccessful outreach was attempted today to offer the patient with information about available care coordination services as a benefit of their health plan.     Referral received   Follow Up Plan:  Additional outreach attempts will be made to offer the patient care coordination information and services.   Encounter Outcome:  No Answer  Julian Hy, [place] Direct Dial: [phone]

## 2022-05-01 NOTE — Telephone Encounter (Signed)
Faxed to [employer]

## 2022-05-02 ENCOUNTER — Ambulatory Visit (INDEPENDENT_AMBULATORY_CARE_PROVIDER_SITE_OTHER): Payer: Medicare Other

## 2022-05-02 VITALS — Ht 70.5 in | Wt 248.0 lb

## 2022-05-02 DIAGNOSIS — Z0000 Encounter for general adult medical examination without abnormal findings: Secondary | ICD-10-CM | POA: Diagnosis not present

## 2022-05-02 NOTE — Progress Notes (Signed)
Virtual Visit via Telephone Note  I connected with  [name] on 05/02/22 at  1:45 PM EDT by telephone and verified that I am speaking with the correct person using two identifiers.  Location: Patient: home Provider: [facility] Persons participating in the virtual visit: patient/Nurse Health Advisor   I discussed the limitations, risks, security and privacy concerns of performing an evaluation and management service by telephone and the availability of in person appointments. The patient expressed understanding and agreed to proceed.  Interactive audio and video telecommunications were attempted between this nurse and patient, however failed, due to patient having technical difficulties OR patient did not have access to video capability.  We continued and completed visit with audio only.  Some vital signs may be absent or patient reported.   Dionisio David, LPN  Subjective:   [name] is a 61 y.o. female who presents for Medicare Annual (Subsequent) preventive examination.  Review of Systems     Cardiac Risk Factors include: advanced age (>59men, >62 women);sedentary lifestyle     Objective:    Today's Vitals   05/02/22 1356  Weight: 248 lb (112.5 kg)  Height: 5' 10.5" (1.791 m)   Body mass index is 35.08 kg/m.     05/02/2022    1:46 PM 03/23/2022    1:16 PM 01/23/2022    9:13 AM 01/16/2022    1:59 PM 09/14/2021    2:19 PM 06/30/2021   12:41 PM 06/14/2021    9:19 AM  Advanced Directives  Does Patient Have a Medical Advance Directive? No No No No No No No  Would patient like information on creating a medical advance directive? No - Patient declined No - Patient declined No - Patient declined   No - Patient declined No - Patient declined    Current Medications (verified) Outpatient Encounter Medications as of 05/02/2022  Medication Sig   atorvastatin (LIPITOR) 10 MG tablet Take 1 tablet (10 mg total) by mouth daily.   benztropine (COGENTIN) 1 MG tablet Take 1 tablet (1  mg total) by mouth daily.   cycloSPORINE (RESTASIS) 0.05 % ophthalmic emulsion Place 1 drop into both eyes 2 (two) times daily.   dicyclomine (BENTYL) 10 MG capsule TAKE 1 CAPSULE BY MOUTH UP TO 3 TIMES DAILY BEFORE MEALS AS NEEDED FOR ABDOMINAL CRAMPS/LOOSE STOOLS. (Patient taking differently: Take 10 mg by mouth 3 (three) times daily as needed (ABDOMINAL CRAMPS/LOOSE STOOLS.).)   famotidine (PEPCID) 20 MG tablet Take 1 tablet (20 mg total) by mouth 2 (two) times daily.   GOODSENSE HEMORRHOIDAL 0.25-88.44 % suppository UNWRAP AND PLACE 1 SUPPOSITORY RECTALLY 2 TIMES DAILY AS NEEDED FOR HEMORRHOIDS OR ANAL ITCHING (Patient taking differently: Place 1 suppository rectally 2 (two) times daily as needed for hemorrhoids.)   hydrocortisone (ANUSOL-HC) 2.5 % rectal cream PLACE 1 APPLICATION RECTALLY 2 TIMES DAILY.   hydrocortisone (ANUSOL-HC) 25 MG suppository Place 25 mg rectally 2 (two) times daily.   lamoTRIgine (LAMICTAL) 100 MG tablet Take 1 tablet (100 mg total) by mouth 2 (two) times daily.   loperamide (IMODIUM A-D) 2 MG tablet Take 1 tablet (2 mg total) by mouth daily. (Patient taking differently: Take 2 mg by mouth daily. As needed diarrhea)   naproxen sodium (ALEVE) 220 MG tablet Take 220 mg by mouth daily as needed (headache).   paliperidone (INVEGA) 9 MG 24 hr tablet Take 1 tablet (9 mg total) by mouth daily.   potassium chloride (KLOR-CON) 10 MEQ tablet TAKE 2 TABLETS (20MEQ) BY MOUTH  TWICE DAILY WITH FOOD   propranolol (INDERAL) 10 MG tablet TAKE 1 TABLET BY MOUTH THREE TIMES A DAY.   sertraline (ZOLOFT) 100 MG tablet Take 100 mg by mouth daily.   torsemide (DEMADEX) 20 MG tablet TAKE (1) TABLET BY MOUTH EACH MORNING. (Patient taking differently: Take 20 mg by mouth daily.)   traZODone (DESYREL) 100 MG tablet TAKE 2 TABLETS(200MG ) BY MOUTH AT BEDTIME.   TRELEGY ELLIPTA 100-62.5-25 MCG/ACT AEPB INHALE (1) PUFF INTO THE LUNGS ONCE DAILY.   [DISCONTINUED] loperamide (IMODIUM) 2 MG capsule TAKE  (1) TABLET BY MOUTH DAILY.   RABEprazole (ACIPHEX) 20 MG tablet TAKE 1 TABLET BY MOUTH TWICE A DAY. (Patient taking differently: Take 20 mg by mouth in the morning and at bedtime.)   No facility-administered encounter medications on file as of 05/02/2022.    Allergies (verified) Thorazine [chlorpromazine hcl], Acetaminophen, Aspirin, Aspirin-acetaminophen-caffeine, Nsaids, Other, Penicillins, and Tomato   History: Past Medical History:  Diagnosis Date   Anxiety    Aortic atherosclerosis ([place]) 09/19/2020   Seen on CAT scan 2021   Arthritis    Depression    Diabetes mellitus    Diabetes mellitus, type II ([place])    Dyspnea    Dysrhythmia    GERD (gastroesophageal reflux disease)    Heart murmur    HTN (hypertension)    Hyperglycemia    IBS (irritable bowel syndrome)    Lung nodules    right, followed by PCP, PET 11/2011   Mental retardation    MI (myocardial infarction) ([place])    Migraines    Pneumonia    Sleep apnea    cpap    Stroke [facility])    Past Surgical History:  Procedure Laterality Date   ABDOMINAL HYSTERECTOMY     BIOPSY  01/15/2020   Procedure: BIOPSY;  Surgeon: Daneil Dolin, MD;  [facility];  Service: Endoscopy;;  gastric   BIOPSY  01/25/2022   Procedure: BIOPSY;  Surgeon: Daneil Dolin, MD;  [facility];  Service: Endoscopy;;  ascending;descending;sigmoid;   BRONCHIAL BIOPSY  06/14/2021   Procedure: BRONCHIAL BIOPSIES;  Surgeon: Garner Nash, DO;  Location: [place] [facility];  Service: Pulmonary;;   BRONCHIAL BRUSHINGS  06/14/2021   Procedure: BRONCHIAL BRUSHINGS;  Surgeon: Garner Nash, DO;  Location: [place];  Service: Pulmonary;;   BRONCHIAL NEEDLE ASPIRATION BIOPSY  06/14/2021   Procedure: BRONCHIAL NEEDLE ASPIRATION BIOPSIES;  Surgeon: Garner Nash, DO;  Location: [place] [facility];  Service: Pulmonary;;   COLONOSCOPY  11/2007   hyperplastic polyps, prior hx of adenomas    COLONOSCOPY  05/2010   incomplete due to poor  prep, hyperplastic rectal polyp   COLONOSCOPY  05/05/2002   Dimunitive polyps in the rectum and left colon, cold    biopsied/removed.  Scattered few left-sided diverticula.  Regular colonic   mucosa appeared normal   COLONOSCOPY N/A 05/28/2013   Rourk: mulitple tubular adenomas removed. next tcs 05/2016   COLONOSCOPY WITH PROPOFOL N/A 09/07/2016   Dr. Gala Romney: For hyperplastic polyps removed. Next colonoscopy March 2023 given history of adenomatous colon polyps in the past.   COLONOSCOPY WITH PROPOFOL N/A 01/25/2022   Redundant/elongated colon, melanosis coli, left-sided diverticula, multiple colon polyps, colonic lipoma. Negative active colitis, adenomas. 3 year surveillance.   ESOPHAGOGASTRODUODENOSCOPY  08/2007   moderate sized hiatal hernia   ESOPHAGOGASTRODUODENOSCOPY  05/2010   noncritical schatzki ring s/p 41F   ESOPHAGOGASTRODUODENOSCOPY (EGD) WITH ESOPHAGEAL DILATION N/A 02/06/2013   GYJ:EHUDJS esophagus-s/p dilation up to a  105 French size with [employer] dilators.  Hiatal hernia   ESOPHAGOGASTRODUODENOSCOPY (EGD) WITH PROPOFOL N/A 09/07/2016   Dr. Gala Romney: Normal, status post empiric dilation of the esophagus for history of dysphagia   ESOPHAGOGASTRODUODENOSCOPY (EGD) WITH PROPOFOL N/A 05/02/2019   Normal esophagus s/p dilation, normal stomach, normal duodenum   ESOPHAGOGASTRODUODENOSCOPY (EGD) WITH PROPOFOL N/A 01/15/2020   normal esophagus s/p dilation, gastric nodule s/p biopsy. This showed reactive gastropathy with H.pylori.    EXTERNAL EAR SURGERY     bilateral   FIDUCIAL MARKER PLACEMENT  06/14/2021   Procedure: FIDUCIAL MARKER PLACEMENT;  Surgeon: Garner Nash, DO;  Location: [place] [facility];  Service: Pulmonary;;   FOOT SURGERY     GLAUCOMA SURGERY     KNEE ARTHROPLASTY Right 11/04/2020   Procedure: COMPUTER ASSISTED TOTAL KNEE ARTHROPLASTY;  Surgeon: Rod Can, MD;  Location: [facility];  Service: Orthopedics;  Laterality: Right;   MALONEY DILATION N/A 09/07/2016    Procedure: Venia Minks DILATION;  Surgeon: Daneil Dolin, MD;  [facility];  Service: Endoscopy;  Laterality: N/A;   MALONEY DILATION N/A 05/02/2019   Procedure: Venia Minks DILATION;  Surgeon: Daneil Dolin, MD;  [facility];  Service: Endoscopy;  Laterality: N/A;   MALONEY DILATION N/A 01/15/2020   Procedure: Venia Minks DILATION;  Surgeon: Daneil Dolin, MD;  [facility];  Service: Endoscopy;  Laterality: N/A;   POLYPECTOMY  09/07/2016   Procedure: POLYPECTOMY;  Surgeon: Daneil Dolin, MD;  [facility];  Service: Endoscopy;;  sigmoid colon x4   POLYPECTOMY  01/25/2022   Procedure: POLYPECTOMY;  Surgeon: Daneil Dolin, MD;  [facility];  Service: Endoscopy;;  hepatic flexure and descending/sigmoid colon   small bowel capsule  10/2007   normal   VIDEO BRONCHOSCOPY WITH RADIAL ENDOBRONCHIAL ULTRASOUND  06/14/2021   Procedure: VIDEO BRONCHOSCOPY WITH RADIAL ENDOBRONCHIAL ULTRASOUND;  Surgeon: Garner Nash, DO;  Location: [facility];  Service: Pulmonary;;   Family History  Problem Relation Age of Onset   Stroke Mother    Heart attack Father    Schizophrenia Other    Drug abuse Other    Alcohol abuse Other    Colon cancer Other        aunt   Obesity Other    COPD Other    Anxiety disorder Other    GER disease Other    Diabetes type II Other    Anxiety disorder Other    Depression Other    Depression Sister    Schizophrenia Sister    Liver disease Neg Hx    Inflammatory bowel disease Neg Hx    Social History   Socioeconomic History   Marital status: Divorced    Spouse name: Not on file   Number of children: Not on file   Years of education: Not on file   Highest education level: High school graduate  Occupational History   Occupation: disabled    Employer: UNEMPLOYED  Tobacco Use   Smoking status: Every Day    Packs/day: 0.50    Years: 40.00    Total pack years: 20.00    Types: Cigarettes    Passive  exposure: Current   Smokeless tobacco: Never  Vaping Use   Vaping Use: Never used  Substance and Sexual Activity   Alcohol use: No   Drug use: No   Sexual activity: Not Currently    Partners: Male    Birth control/protection: Surgical    Comment: hyst  Other  Topics Concern   Not on file  Social History Narrative   Not on file   Social Determinants of Health   Financial Resource Strain: Low Risk  (05/02/2022)   Overall Financial Resource Strain (CARDIA)    Difficulty of Paying Living Expenses: Not hard at all  Food Insecurity: No Food Insecurity (05/02/2022)   Hunger Vital Sign    Worried About Running Out of Food in the Last Year: Never true    Ran Out of Food in the Last Year: Never true  Transportation Needs: No Transportation Needs (05/02/2022)   PRAPARE - Hydrologist (Medical): No    Lack of Transportation (Non-Medical): No  Physical Activity: Insufficiently Active (05/02/2022)   Exercise Vital Sign    Days of Exercise per Week: 3 days    Minutes of Exercise per Session: 30 min  Stress: No Stress Concern Present (05/02/2022)   [place]    Feeling of Stress : Not at all  Social Connections: Socially Isolated (05/02/2022)   Social Connection and Isolation Panel [NHANES]    Frequency of Communication with Friends and Family: Twice a week    Frequency of Social Gatherings with Friends and Family: Once a week    Attends Religious Services: Never    Marine scientist or Organizations: No    Attends Music therapist: Never    Marital Status: Divorced    Tobacco Counseling Ready to quit: Not Answered Counseling given: Not Answered   Clinical Intake:  Pre-visit preparation completed: Yes        Diabetes: Yes CBG done?: No Did pt. bring in CBG monitor from home?: No  How often do you need to have someone help you when you read instructions,  pamphlets, or other written materials from your doctor or pharmacy?: 1 - Never  Diabetic?yes Nutrition Risk Assessment:  Has the patient had any N/V/D within the last 2 months?  Yes  Does the patient have any non-healing wounds?  No  Has the patient had any unintentional weight loss or weight gain?  No   Diabetes:  Is the patient diabetic?  Yes  If diabetic, was a CBG obtained today?  No  Did the patient bring in their glucometer from home?  No  How often do you monitor your CBG's? never.   Financial Strains and Diabetes Management:  Are you having any financial strains with the device, your supplies or your medication? No .  Does the patient want to be seen by Chronic Care Management for management of their diabetes?  No  Would the patient like to be referred to a Nutritionist or for Diabetic Management?  No   Diabetic Exams:  Diabetic Eye Exam: Completed 04/27/17. Overdue for diabetic eye exam. Pt has been advised about the importance in completing this exam.   Diabetic Foot Exam: Completed 06/13/21. Pt has been advised about the importance in completing this exam.   Interpreter Needed?: No  Information entered by :: Kirke Shaggy, LPN   Activities of Daily Living    05/02/2022    1:48 PM 01/23/2022    9:16 AM  In your present state of health, do you have any difficulty performing the following activities:  Hearing? 1   Vision? 1   Difficulty concentrating or making decisions? 1   Walking or climbing stairs? 1   Dressing or bathing? 0   Doing errands, shopping? 1 0  Preparing Food and  eating ? N   Using the Toilet? N   In the past six months, have you accidently leaked urine? N   Do you have problems with loss of bowel control? N   Managing your Medications? Y   Managing your Finances? Y   Housekeeping or managing your Housekeeping? Y     Patient Care Team: Kathyrn Drown, MD as PCP - General (Family Medicine) Gala Romney [name], MD (Gastroenterology)  Indicate  any recent Medical Services you may have received from other than [facility] providers in the past year (date may be approximate).     Assessment:   This is a routine wellness examination for Leiya.  Hearing/Vision screen Hearing Screening - Comments:: No aids Vision Screening - Comments:: Wears glasses- [facility]  Dietary issues and exercise activities discussed: Current Exercise Habits: Home exercise routine, Type of exercise: walking, Time (Minutes): 30, Frequency (Times/Week): 3, Weekly Exercise (Minutes/Week): 90, Intensity: Mild   Goals Addressed             This Visit's Progress    DIET - EAT MORE FRUITS AND VEGETABLES         Depression Screen    05/02/2022    1:44 PM 04/24/2022    2:30 PM 03/01/2022    9:48 AM 08/31/2021   10:00 AM 04/26/2021    1:18 PM 03/03/2021   10:51 AM 03/03/2021   10:21 AM  PHQ 2/9 Scores  PHQ - 2 Score 1 6   1 4  0  PHQ- 9 Score 3 15   1 15       Information is confidential and restricted. Go to Review Flowsheets to unlock data.    Fall Risk    05/02/2022    1:47 PM 04/24/2022    1:08 PM 04/26/2021    1:22 PM 03/03/2021   10:21 AM 12/01/2020   10:12 AM  Fall Risk   Falls in the past year? 1 0 1 1 1   Number falls in past yr: 1 0 0 0 1  Injury with Fall? 1 0 1 1 1   Risk for fall due to : History of fall(s);Impaired vision No Fall Risks History of fall(s);Impaired balance/gait;Impaired mobility No Fall Risks Impaired balance/gait  Follow up Falls prevention discussed;Falls evaluation completed Falls evaluation completed Falls prevention discussed Falls evaluation completed Falls evaluation completed;Education provided    FALL RISK PREVENTION PERTAINING TO THE HOME:  Any stairs in or around the home? Yes  If so, are there any without handrails? No  Home free of loose throw rugs in walkways, pet beds, electrical cords, etc? Yes  Adequate lighting in your home to reduce risk of falls? Yes   ASSISTIVE DEVICES UTILIZED TO PREVENT  FALLS:  Life alert? No  Use of a cane, walker or w/c? Yes  Grab bars in the bathroom? Yes  Shower chair or bench in shower? Yes  Elevated toilet seat or a handicapped toilet? Yes   Cognitive Function:declined PT is A & O        04/26/2021    1:29 PM  6CIT Screen  What Year? 0 points  What month? 0 points  What time? 0 points  Count back from 20 0 points  Months in reverse 0 points  Repeat phrase 0 points  Total Score 0 points    Immunizations Immunization History  Administered Date(s) Administered   Influenza Split 04/28/2013   Influenza,inj,Quad PF,6+ Mos 06/09/2015, 04/23/2017, 03/23/2019, 05/31/2020, 06/09/2021, 04/24/2022   Influenza-Unspecified 05/12/2014, 04/28/2016, 05/01/2018  Moderna Sars-Covid-2 Vaccination 09/08/2019, 10/08/2019   Pneumococcal Polysaccharide-23 03/03/2014   Zoster Recombinat (Shingrix) 05/18/2021    TDAP status: Due, Education has been provided regarding the importance of this vaccine. Advised may receive this vaccine at local pharmacy or Health Dept. Aware to provide a copy of the vaccination record if obtained from local pharmacy or Health Dept. Verbalized acceptance and understanding.  Flu Vaccine status: Up to date  Pneumococcal vaccine status: Up to date  Covid-19 vaccine status: Completed vaccines  Qualifies for Shingles Vaccine? Yes   Zostavax completed No   Shingrix Completed?: No.    Education has been provided regarding the importance of this vaccine. Patient has been advised to call insurance company to determine out of pocket expense if they have not yet received this vaccine. Advised may also receive vaccine at local pharmacy or Health Dept. Verbalized acceptance and understanding.  Screening Tests Health Maintenance  Topic Date Due   TETANUS/TDAP  Never done   OPHTHALMOLOGY EXAM  04/27/2018   COVID-19 Vaccine (3 - Moderna risk series) 11/05/2019   Zoster Vaccines- Shingrix (2 of 2) 07/13/2021   FOOT EXAM  06/13/2022    HEMOGLOBIN A1C  07/05/2022   Diabetic kidney evaluation - Urine ACR  09/09/2022   MAMMOGRAM  01/28/2023   Diabetic kidney evaluation - GFR measurement  03/24/2023   Medicare Annual Wellness (AWV)  06/02/2023   COLONOSCOPY (Pts 45-43yrs Insurance coverage will need to be confirmed)  01/26/2032   INFLUENZA VACCINE  Completed   Hepatitis C Screening  Completed   HIV Screening  Completed   HPV VACCINES  Aged Out    Health Maintenance  Health Maintenance Due  Topic Date Due   TETANUS/TDAP  Never done   OPHTHALMOLOGY EXAM  04/27/2018   COVID-19 Vaccine (3 - Moderna risk series) 11/05/2019   Zoster Vaccines- Shingrix (2 of 2) 07/13/2021    Colorectal cancer screening: Type of screening: Colonoscopy. Completed 01/25/22. Repeat every 10 years  Mammogram status: Completed scheduled for 05/25/22. Repeat every year   Lung Cancer Screening: (Low Dose CT Chest recommended if Age 48-80 years, 30 pack-year currently smoking OR have quit w/in 15years.) does not qualify.   Additional Screening:  Hepatitis C Screening: does qualify; Completed 03/25/19  Vision Screening: Recommended annual ophthalmology exams for early detection of glaucoma and other disorders of the eye. Is the patient up to date with their annual eye exam?  Yes  Who is the provider or what is the name of the office in which the patient attends annual eye exams? [facility] If pt is not established with a provider, would they like to be referred to a provider to establish care? No .   Dental Screening: Recommended annual dental exams for proper oral hygiene  Community Resource Referral / Chronic Care Management: CRR required this visit?  No   CCM required this visit?  No      Plan:     I have personally reviewed and noted the following in the patient's chart:   Medical and social history Use of alcohol, tobacco or illicit drugs  Current medications and supplements including opioid prescriptions. Patient is not  currently taking opioid prescriptions. Functional ability and status Nutritional status Physical activity Advanced directives List of other physicians Hospitalizations, surgeries, and ER visits in previous 12 months Vitals Screenings to include cognitive, depression, and falls Referrals and appointments  In addition, I have reviewed and discussed with patient certain preventive protocols, quality metrics, and best practice recommendations. A  written personalized care plan for preventive services as well as general preventive health recommendations were provided to patient.     Dionisio David, LPN   75/30/0511   Nurse Notes: none

## 2022-05-02 NOTE — Patient Instructions (Signed)
[name] , Thank you for taking time to come for your Medicare Wellness Visit. I appreciate your ongoing commitment to your health goals. Please review the following plan we discussed and let me know if I can assist you in the future.   Screening recommendations/referrals: Colonoscopy: 01/25/22 Mammogram: scheduled 05/25/22 Recommended yearly ophthalmology/optometry visit for glaucoma screening and checkup Recommended yearly dental visit for hygiene and checkup  Vaccinations: Influenza vaccine: 04/24/22 Pneumococcal vaccine: 03/03/14 Tdap vaccine: n/d Shingles vaccine: Shingrix 05/18/21  Covid-19: 09/08/19, 10/08/19  Advanced directives: no  Conditions/risks identified: none  Next appointment: Follow up in one year for your annual wellness visit. 05/15/23 @ 11:15 am by phone  Preventive Care 40-64 Years, Female Preventive care refers to lifestyle choices and visits with your health care provider that can promote health and wellness. What does preventive care include? A yearly physical exam. This is also called an annual well check. Dental exams once or twice a year. Routine eye exams. Ask your health care provider how often you should have your eyes checked. Personal lifestyle choices, including: Daily care of your teeth and gums. Regular physical activity. Eating a healthy diet. Avoiding tobacco and drug use. Limiting alcohol use. Practicing safe sex. Taking low-dose aspirin daily starting at age 54. Taking vitamin and mineral supplements as recommended by your health care provider. What happens during an annual well check? The services and screenings done by your health care provider during your annual well check will depend on your age, overall health, lifestyle risk factors, and family history of disease. Counseling  Your health care provider may ask you questions about your: Alcohol use. Tobacco use. Drug use. Emotional well-being. Home and relationship well-being. Sexual  activity. Eating habits. Work and work Statistician. Method of birth control. Menstrual cycle. Pregnancy history. Screening  You may have the following tests or measurements: Height, weight, and BMI. Blood pressure. Lipid and cholesterol levels. These may be checked every 5 years, or more frequently if you are over 21 years old. Skin check. Lung cancer screening. You may have this screening every year starting at age 67 if you have a 30-pack-year history of smoking and currently smoke or have quit within the past 15 years. Fecal occult blood test (FOBT) of the stool. You may have this test every year starting at age 5. Flexible sigmoidoscopy or colonoscopy. You may have a sigmoidoscopy every 5 years or a colonoscopy every 10 years starting at age 65. Hepatitis C blood test. Hepatitis B blood test. Sexually transmitted disease (STD) testing. Diabetes screening. This is done by checking your blood sugar (glucose) after you have not eaten for a while (fasting). You may have this done every 1-3 years. Mammogram. This may be done every 1-2 years. Talk to your health care provider about when you should start having regular mammograms. This may depend on whether you have a family history of breast cancer. BRCA-related cancer screening. This may be done if you have a family history of breast, ovarian, tubal, or peritoneal cancers. Pelvic exam and Pap test. This may be done every 3 years starting at age 21. Starting at age 48, this may be done every 5 years if you have a Pap test in combination with an HPV test. Bone density scan. This is done to screen for osteoporosis. You may have this scan if you are at high risk for osteoporosis. Discuss your test results, treatment options, and if necessary, the need for more tests with your health care provider. Vaccines  Your  health care provider may recommend certain vaccines, such as: Influenza vaccine. This is recommended every year. Tetanus, diphtheria,  and acellular pertussis (Tdap, Td) vaccine. You may need a Td booster every 10 years. Zoster vaccine. You may need this after age 60. Pneumococcal 13-valent conjugate (PCV13) vaccine. You may need this if you have certain conditions and were not previously vaccinated. Pneumococcal polysaccharide (PPSV23) vaccine. You may need one or two doses if you smoke cigarettes or if you have certain conditions. Talk to your health care provider about which screenings and vaccines you need and how often you need them. This information is not intended to replace advice given to you by your health care provider. Make sure you discuss any questions you have with your health care provider. Document Released: 07/23/2015 Document Revised: 03/15/2016 Document Reviewed: 04/27/2015 Elsevier Interactive Patient Education  2017 Elsevier Inc.    Fall Prevention in the Home Falls can cause injuries. They can happen to people of all ages. There are many things you can do to make your home safe and to help prevent falls. What can I do on the outside of my home? Regularly fix the edges of walkways and driveways and fix any cracks. Remove anything that might make you trip as you walk through a door, such as a raised step or threshold. Trim any bushes or trees on the path to your home. Use bright outdoor lighting. Clear any walking paths of anything that might make someone trip, such as rocks or tools. Regularly check to see if handrails are loose or broken. Make sure that both sides of any steps have handrails. Any raised decks and porches should have guardrails on the edges. Have any leaves, snow, or ice cleared regularly. Use sand or salt on walking paths during winter. Clean up any spills in your garage right away. This includes oil or grease spills. What can I do in the bathroom? Use night lights. Install grab bars by the toilet and in the tub and shower. Do not use towel bars as grab bars. Use non-skid mats or  decals in the tub or shower. If you need to sit down in the shower, use a plastic, non-slip stool. Keep the floor dry. Clean up any water that spills on the floor as soon as it happens. Remove soap buildup in the tub or shower regularly. Attach bath mats securely with double-sided non-slip rug tape. Do not have throw rugs and other things on the floor that can make you trip. What can I do in the bedroom? Use night lights. Make sure that you have a light by your bed that is easy to reach. Do not use any sheets or blankets that are too big for your bed. They should not hang down onto the floor. Have a firm chair that has side arms. You can use this for support while you get dressed. Do not have throw rugs and other things on the floor that can make you trip. What can I do in the kitchen? Clean up any spills right away. Avoid walking on wet floors. Keep items that you use a lot in easy-to-reach places. If you need to reach something above you, use a strong step stool that has a grab bar. Keep electrical cords out of the way. Do not use floor polish or wax that makes floors slippery. If you must use wax, use non-skid floor wax. Do not have throw rugs and other things on the floor that can make you trip. What can   I do with my stairs? Do not leave any items on the stairs. Make sure that there are handrails on both sides of the stairs and use them. Fix handrails that are broken or loose. Make sure that handrails are as long as the stairways. Check any carpeting to make sure that it is firmly attached to the stairs. Fix any carpet that is loose or worn. Avoid having throw rugs at the top or bottom of the stairs. If you do have throw rugs, attach them to the floor with carpet tape. Make sure that you have a light switch at the top of the stairs and the bottom of the stairs. If you do not have them, ask someone to add them for you. What else can I do to help prevent falls? Wear shoes that: Do not  have high heels. Have rubber bottoms. Are comfortable and fit you well. Are closed at the toe. Do not wear sandals. If you use a stepladder: Make sure that it is fully opened. Do not climb a closed stepladder. Make sure that both sides of the stepladder are locked into place. Ask someone to hold it for you, if possible. Clearly mark and make sure that you can see: Any grab bars or handrails. First and last steps. Where the edge of each step is. Use tools that help you move around (mobility aids) if they are needed. These include: Canes. Walkers. Scooters. Crutches. Turn on the lights when you go into a dark area. Replace any light bulbs as soon as they burn out. Set up your furniture so you have a clear path. Avoid moving your furniture around. If any of your floors are uneven, fix them. If there are any pets around you, be aware of where they are. Review your medicines with your doctor. Some medicines can make you feel dizzy. This can increase your chance of falling. Ask your doctor what other things that you can do to help prevent falls. This information is not intended to replace advice given to you by your health care provider. Make sure you discuss any questions you have with your health care provider. Document Released: 04/22/2009 Document Revised: 12/02/2015 Document Reviewed: 07/31/2014 Elsevier Interactive Patient Education  2017 [employer].

## 2022-05-04 ENCOUNTER — Telehealth: Payer: Self-pay | Admitting: *Deleted

## 2022-05-04 NOTE — Telephone Encounter (Signed)
[name] with social services called and stated the patient is needing order for incontinence pads for her bed [facility]- she is having accidents at night

## 2022-05-04 NOTE — Telephone Encounter (Signed)
Please write out an order I will be happy to sign it thank you

## 2022-05-08 NOTE — Chronic Care Management (AMB) (Signed)
  Care Coordination   Note   05/08/2022 Name: [name] MRN: [id number] DOB: [birth year]  [name] is a 61 y.o. year old female who sees Luking, Elayne Snare, MD for primary care. I reached out to Lanae Boast by phone today to offer care coordination services.  [name] was given information about Care Coordination services today including:   The Care Coordination services include support from the care team which includes your Nurse Coordinator, Clinical Social Worker, or Pharmacist.  The Care Coordination team is here to help remove barriers to the health concerns and goals most important to you. Care Coordination services are voluntary, and the patient may decline or stop services at any time by request to their care team member.   Care Coordination Consent Status: Patient agreed to services and verbal consent obtained.   Follow up plan:  Telephone appointment with care coordination team member scheduled for:  05/10/2022  Encounter Outcome:  Pt. Scheduled from referral   Julian Hy, [place] Direct Dial: [phone]

## 2022-05-08 NOTE — Telephone Encounter (Signed)
Script written out and placed on provider door. Left message for social worker to return call to let her know.

## 2022-05-08 NOTE — Progress Notes (Signed)
CPAP script written out. Will fax script and sleep study to pharmacy once script signed.

## 2022-05-09 NOTE — Telephone Encounter (Signed)
Script signed and faxed to [facility]. Pt social worker is aware

## 2022-05-10 ENCOUNTER — Ambulatory Visit: Payer: Self-pay | Admitting: *Deleted

## 2022-05-10 NOTE — Patient Instructions (Addendum)
Visit Information  Thank you for taking time to visit with me today. Please don't hesitate to contact me if I can be of assistance to you.   Following are the goals we discussed today:   Goals Addressed               This Visit's Progress     Patient Stated     improve worsenting respiratory symptoms (THN) (pt-stated)   Not on track     Care Coordination Interventions: Evaluation of current treatment plan related to obstructive sleep apnea, need for new CPAP and patient's adherence to plan as established by provider Provided education to patient re: sleep study test prior to a new CPAP Reviewed scheduled/upcoming provider appointments including 05/18/22 to Dr Valeta Harms, pulmonology who can assist with her nebulizer Discussed plans with patient for ongoing care management follow up and provided patient with direct contact information for care management team Screening for signs and symptoms of depression related to chronic disease state  Assessed social determinant of health barriers      maintain control of blood pressure at home [facility]) (pt-stated)   Not on track     Care Coordination Interventions: Evaluation of current treatment plan related to hypertension self management and patient's adherence to plan as established by provider Discussed plans with patient for ongoing care management follow up and provided patient with direct contact information for care management team Screening for signs and symptoms of depression related to chronic disease state  Assessed social determinant of health barriers Conference call to [place] healthcare with patient to [phone] Spoke with Altha Harm to have an over the Therapist, occupational mailed to the patient's home. Shared with the patient that her U card is also available to use to purchase a blood pressure cuff at the assigned stores and the OTC catalog Patient able to write down RN CM office number for future references & outreaches        Our next  appointment is by telephone on 05/18/22 at 3:30 pm  Please call the care guide team at [phone] if you need to cancel or reschedule your appointment.   If you are experiencing a Mental Health or [facility] or need someone to talk to, please call the Suicide and Crisis Lifeline: 988 call the [place] National Suicide Prevention Lifeline: 332-355-3834 or TTY: 418-877-6450 TTY 985 347 2782) to talk to a trained counselor call 1-800-273-TALK (toll free, 24 hour hotline) call the [facility]: [phone] call 911   The patient verbalized understanding of instructions, educational materials, and care plan provided today and DECLINED offer to receive copy of patient instructions, educational materials, and care plan.   The patient has been provided with contact information for the care management team and has been advised to call with any health related questions or concerns.   Crewe Heathman L. Lavina Hamman, RN, BSN, [place] Coordinator Office number [phone]

## 2022-05-10 NOTE — Patient Outreach (Signed)
Care Coordination   Initial Visit Note   05/10/2022 Name: [name] MRN: [id number] DOB: 1961/05/06  [name] is a 61 y.o. year old female who sees Luking, Elayne Snare, MD for primary care. I spoke with  [name] by phone today.  What matters to the patients health and wellness today?  Increased breathing, need CPAP (last sleep apnea study in 2019, Auto CPAP 10-20 pressure per pcp 04/26/22 note) , nebulizer and Blood pressure cuff Have not received since 04/24/22 MD appointment  New OTC catalog needed from [employer]  During the outreach with Altha Harm of [employer] to have an over the counter (OTC) Catalog mailed, [name] was provided information about her transportation benefit to include an annual availability of 48 one way rides, 2 emergency rides with a $0 co pay both in and out of network [name] does not have access nor uses a computer so the catalog will be mailed  She reports using her u card for groceries, medications but appreciate being aware that she can use it to purchase her blood pressure cuff  She confirms she has someone to take her to her medical appointments or she uses her medicaid services    Her CPAP script was noted to have been sent to [facility] after 05/02/22    Goals Addressed               This Visit's Progress     Patient Stated     improve worsenting respiratory symptoms (THN) (pt-stated)   Not on track     Care Coordination Interventions: Evaluation of current treatment plan related to obstructive sleep apnea, need for new CPAP and patient's adherence to plan as established by provider Provided education to patient re: sleep study test prior to a new CPAP Reviewed scheduled/upcoming provider appointments including 05/18/22 to Dr Valeta Harms, pulmonology who can assist with her nebulizer Discussed plans with patient for ongoing care management follow up and provided patient with direct contact information for  care management team Screening for signs and symptoms of depression related to chronic disease state  Assessed social determinant of health barriers      maintain control of blood pressure at home [facility]) (pt-stated)   Not on track     Care Coordination Interventions: Evaluation of current treatment plan related to hypertension self management and patient's adherence to plan as established by provider Discussed plans with patient for ongoing care management follow up and provided patient with direct contact information for care management team Screening for signs and symptoms of depression related to chronic disease state  Assessed social determinant of health barriers Conference call to [place] healthcare with patient to [phone] Spoke with Altha Harm to have an over the Therapist, occupational mailed to the patient's home. Shared with the patient that her U card is also available to use to purchase a blood pressure cuff at the assigned stores and the OTC catalog Patient able to write down RN CM office number for future references & outreaches        SDOH assessments and interventions completed:  Yes  SDOH Interventions Today    Flowsheet Row Most Recent Value  SDOH Interventions   Housing Interventions Intervention Not Indicated  Transportation Interventions Intervention Not Indicated  Utilities Interventions Intervention Not Indicated  Stress Interventions Intervention Not Indicated        Care Coordination Interventions Activated:  Yes  Care Coordination Interventions:  Yes, provided   Follow up  plan: Follow up call scheduled for 05/18/22    Encounter Outcome:  Pt. Visit Completed   [name] L. Lavina Hamman, RN, BSN, [place] Coordinator Office number [phone]

## 2022-05-12 ENCOUNTER — Ambulatory Visit (INDEPENDENT_AMBULATORY_CARE_PROVIDER_SITE_OTHER): Payer: Medicare Other | Admitting: Podiatry

## 2022-05-12 ENCOUNTER — Encounter: Payer: Self-pay | Admitting: Podiatry

## 2022-05-12 DIAGNOSIS — M79676 Pain in unspecified toe(s): Secondary | ICD-10-CM | POA: Diagnosis not present

## 2022-05-12 DIAGNOSIS — B351 Tinea unguium: Secondary | ICD-10-CM

## 2022-05-12 DIAGNOSIS — M2011 Hallux valgus (acquired), right foot: Secondary | ICD-10-CM

## 2022-05-12 DIAGNOSIS — E1142 Type 2 diabetes mellitus with diabetic polyneuropathy: Secondary | ICD-10-CM | POA: Diagnosis not present

## 2022-05-12 DIAGNOSIS — M2012 Hallux valgus (acquired), left foot: Secondary | ICD-10-CM | POA: Diagnosis not present

## 2022-05-12 NOTE — Progress Notes (Signed)
This patient returns to my office for at risk foot care.  This patient requires this care by a professional since this patient will be at risk due to having  diabetes.  This patient is unable to cut nails himself since the patient cannot reach his nails.These nails are painful walking and wearing shoes.  This patient presents for at risk foot care today.  General Appearance  Alert, conversant and in no acute stress.  Vascular  Dorsalis pedis and posterior tibial  pulses are palpable  bilaterally.  Capillary return is within normal limits  bilaterally. Temperature is within normal limits  bilaterally.  Neurologic  Senn-Weinstein monofilament wire test within normal limits  bilaterally. Muscle power within normal limits bilaterally.  Nails Thick disfigured discolored nails with subungual debris  from hallux to fifth toes bilaterally. No evidence of bacterial infection or drainage bilaterally.  Orthopedic  No limitations of motion  feet .  No crepitus or effusions noted.  No bony pathology or digital deformities noted.  Skin  normotropic skin with no porokeratosis noted bilaterally.  No signs of infections or ulcers noted.     Onychomycosis  Pain in right toes  Pain in left toes  Consent was obtained for treatment procedures.   Mechanical debridement of nails 1-5  bilaterally performed with a nail nipper.  Filed with dremel without incident.    Return office visit   3 months                   Told patient to return for periodic foot care and evaluation due to potential at risk complications.   Adler Chartrand DPM   

## 2022-05-16 ENCOUNTER — Telehealth: Payer: Self-pay | Admitting: *Deleted

## 2022-05-16 NOTE — Patient Outreach (Signed)
  Care Coordination   Follow Up Visit Note   05/16/2022 Name: LATTIE RIEGE MRN: [id number] DOB: 1960-09-22  SHAUNIE BOEHM is a 61 y.o. year old female who sees Luking, Elayne Snare, MD for primary care. I  left a voice message for Sharyn Lull, patient's [place] DSS case worker   What matters to the patients health and wellness today?   Returned a call after Sharyn Lull left a voice message on 05/15/22 0903   Goals Addressed               This Visit's Progress     Patient Stated     maintain control of blood pressure at home [facility]) (pt-stated)        Care Coordination Interventions: Evaluation of current treatment plan related to hypertension self management and patient's adherence to plan as established by provider Discussed plans with patient for ongoing care management follow up and provided patient with direct contact information for care management team Screening for signs and symptoms of depression related to chronic disease state  Assessed social determinant of health barriers Returned a call to [employer] DSS case worker after she left a message on 05/15/22          SDOH assessments and interventions completed:  No     Care Coordination Interventions Activated:  Yes  Care Coordination Interventions:  Yes, provided   Follow up plan: Follow up call scheduled for 05/18/22    Encounter Outcome:  Pt. Visit Completed   Merilynn Haydu L. Lavina Hamman, RN, BSN, [place] Coordinator Office number [phone]

## 2022-05-18 ENCOUNTER — Ambulatory Visit: Payer: Self-pay | Admitting: *Deleted

## 2022-05-18 ENCOUNTER — Ambulatory Visit (INDEPENDENT_AMBULATORY_CARE_PROVIDER_SITE_OTHER): Payer: Medicare Other | Admitting: Pulmonary Disease

## 2022-05-18 ENCOUNTER — Ambulatory Visit: Payer: Medicare Other | Admitting: Pulmonary Disease

## 2022-05-18 DIAGNOSIS — F17200 Nicotine dependence, unspecified, uncomplicated: Secondary | ICD-10-CM

## 2022-05-18 DIAGNOSIS — D3A8 Other benign neuroendocrine tumors: Secondary | ICD-10-CM | POA: Diagnosis not present

## 2022-05-18 LAB — PULMONARY FUNCTION TEST
DL/VA % pred: 110 %
DL/VA: 4.58 ml/min/mmHg/L
DLCO cor % pred: 64 %
DLCO cor: 14.53 ml/min/mmHg
DLCO unc % pred: 65 %
DLCO unc: 14.75 ml/min/mmHg
FEF 25-75 Post: 0.65 L/sec
FEF 25-75 Pre: 0.92 L/sec
FEF2575-%Change-Post: -29 %
FEF2575-%Pred-Post: 25 %
FEF2575-%Pred-Pre: 35 %
FEV1-%Change-Post: -15 %
FEV1-%Pred-Post: 36 %
FEV1-%Pred-Pre: 42 %
FEV1-Post: 1.04 L
FEV1-Pre: 1.22 L
FEV1FVC-%Change-Post: -4 %
FEV1FVC-%Pred-Pre: 94 %
FEV6-%Change-Post: -10 %
FEV6-%Pred-Post: 41 %
FEV6-%Pred-Pre: 46 %
FEV6-Post: 1.48 L
FEV6-Pre: 1.65 L
FEV6FVC-%Change-Post: 0 %
FEV6FVC-%Pred-Post: 103 %
FEV6FVC-%Pred-Pre: 103 %
FVC-%Change-Post: -10 %
FVC-%Pred-Post: 39 %
FVC-%Pred-Pre: 44 %
FVC-Post: 1.48 L
FVC-Pre: 1.65 L
Post FEV1/FVC ratio: 70 %
Post FEV6/FVC ratio: 100 %
Pre FEV1/FVC ratio: 74 %
Pre FEV6/FVC Ratio: 100 %
RV % pred: 128 %
RV: 2.71 L
TLC % pred: 83 %
TLC: 4.59 L

## 2022-05-18 NOTE — Patient Outreach (Signed)
  Care Coordination   Follow Up Visit Note   05/18/2022 Name: CARTIER MAPEL MRN: [id number] DOB: 1960/10/09  [name] is a 61 y.o. year old female who sees Luking, Elayne Snare, MD for primary care. I spoke with  Lanae Boast by phone today.  What matters to the patients health and wellness today?  Follow up pulmonology visit She confirms she completed her PFT test and did well  She continues to smoke Has to sleep sitting up not lying flat  Her CPAP is not working She will need to have another sleep study She reports her CPAP helped    Discussed the RN CM outreach to her DSS case worker  Still needs a BP cuff and has not called united healthcare over the counter program Disconnected while speaking with [employer] staff and was not able to get patient to answer on return calls  Finally able to speak with her. She called united healthcare to order her a new OTC catalog She did not have enough funds for her to obtain the Relion BP cuff today per Billie Ruddy but she is able to go to [place] to get it and pay the remaining balance $14.90 - $13.93 = 0.97   Goals Addressed               This Visit's Progress     Patient Stated     maintain control of blood pressure at home [facility]) (pt-stated)   On track     Care Coordination Interventions: Evaluation of current treatment plan related to hypertension self management and patient's adherence to plan as established by provider Discussed plans with patient for ongoing care management follow up and provided patient with direct contact information for care management team Conference call with patient to united healthcare and was informed that Mrs Kuna was no longer covered by united healthcare. Another call was returned to confirm she does has coverage. When calling the [place] with Mrs Jermia, R[place]by confirmed she only hs $13.98 left for this quarter but can go to [employer] to purchase the item and pay the balance  Mrs Kilts prefers to do this  before 06/09/22. She will gain more OTC credits on 06/09/22 Had Mrs Brede to write down the name Relion and cost plus RN CM office number           SDOH assessments and interventions completed:  No     Care Coordination Interventions Activated:  Yes  Care Coordination Interventions:  Yes, provided   Follow up plan: Follow up call scheduled for 06/09/22    Encounter Outcome:  Pt. Visit Completed   Mikella Linsley L. Lavina Hamman, RN, BSN, [place] Coordinator Office number [phone]

## 2022-05-18 NOTE — Progress Notes (Signed)
PFT done today. 

## 2022-05-18 NOTE — Patient Instructions (Signed)
Visit Information  Thank you for taking time to visit with me today. Please don't hesitate to contact me if I can be of assistance to you.   Following are the goals we discussed today:   Goals Addressed               This Visit's Progress     Patient Stated     maintain control of blood pressure at home [facility]) (pt-stated)   On track     Care Coordination Interventions: Evaluation of current treatment plan related to hypertension self management and patient's adherence to plan as established by provider Discussed plans with patient for ongoing care management follow up and provided patient with direct contact information for care management team Conference call with patient to [employer] and was informed that Mrs Gladman was no longer covered by [employer]. Another call was returned to confirm she does has coverage. When calling the [place] with Mrs Lidia, Clavijo confirmed she only hs $13.98 left for this quarter but can go to [employer] to purchase the item and pay the balance  Mrs Whan prefers to do this before 06/09/22. She will gain more OTC credits on 06/09/22 Had Mrs Min to write down the name [employer] and cost plus RN CM office number           Our next appointment is by telephone on 06/09/22 at 3:30 pm  Please call the care guide team at [phone] if you need to cancel or reschedule your appointment.   If you are experiencing a Mental Health or [place] or need someone to talk to, please call the Suicide and Crisis Lifeline: 988 call the [place] National Suicide Prevention Lifeline: 6042028624 or TTY: (719) 579-0440 TTY 218-448-7538) to talk to a trained counselor call 1-800-273-TALK (toll free, 24 hour hotline) call the [facility]: (228)501-7420 call 911   The patient verbalized understanding of instructions, educational materials, and care plan provided today and DECLINED offer to receive copy of patient instructions,  educational materials, and care plan.   The patient has been provided with contact information for the care management team and has been advised to call with any health related questions or concerns.    Ermias Tomeo L. Lavina Hamman, RN, BSN, [place] Coordinator Office number [phone]

## 2022-05-19 ENCOUNTER — Ambulatory Visit (HOSPITAL_COMMUNITY): Payer: Medicare Other

## 2022-05-25 ENCOUNTER — Ambulatory Visit (HOSPITAL_COMMUNITY): Payer: Medicare Other

## 2022-05-26 ENCOUNTER — Telehealth: Payer: Self-pay | Admitting: *Deleted

## 2022-05-26 NOTE — Telephone Encounter (Signed)
See message from care team- they state patient needs a new sleep study to gt a new CPAP machine

## 2022-05-29 ENCOUNTER — Other Ambulatory Visit: Payer: Self-pay

## 2022-05-29 ENCOUNTER — Telehealth: Payer: Self-pay

## 2022-05-29 DIAGNOSIS — G4733 Obstructive sleep apnea (adult) (pediatric): Secondary | ICD-10-CM

## 2022-05-29 NOTE — Telephone Encounter (Signed)
Spoke with patient see note.

## 2022-05-29 NOTE — Telephone Encounter (Signed)
Referral placed for sleep studies and per patient may fax script for bed pads to [employer]. Uses 2 to 3 a day.

## 2022-05-29 NOTE — Telephone Encounter (Signed)
Caller name: Rosy [place]   On DPR?: Yes  Call back number: [phone]  Provider they see: Kathyrn Drown, MD  Reason for call:Pt needs bed pads social worker called and requested them

## 2022-05-29 NOTE — Telephone Encounter (Signed)
I would recommend reordering sleep study more than likely the patient would do better with a home sleep study but I am perfectly fine with [facility] with Dr.Athar if they feel that she cannot do a limb study

## 2022-06-07 ENCOUNTER — Ambulatory Visit: Payer: Medicare Other | Admitting: Gastroenterology

## 2022-06-09 ENCOUNTER — Ambulatory Visit (INDEPENDENT_AMBULATORY_CARE_PROVIDER_SITE_OTHER): Payer: Medicare Other | Admitting: Gastroenterology

## 2022-06-09 ENCOUNTER — Encounter: Payer: Self-pay | Admitting: Gastroenterology

## 2022-06-09 ENCOUNTER — Telehealth: Payer: Self-pay | Admitting: Gastroenterology

## 2022-06-09 ENCOUNTER — Ambulatory Visit: Payer: Self-pay | Admitting: *Deleted

## 2022-06-09 VITALS — BP 133/85 | HR 93 | Temp 98.2°F | Ht 70.0 in | Wt 251.4 lb

## 2022-06-09 DIAGNOSIS — K5900 Constipation, unspecified: Secondary | ICD-10-CM | POA: Diagnosis not present

## 2022-06-09 DIAGNOSIS — K6289 Other specified diseases of anus and rectum: Secondary | ICD-10-CM | POA: Insufficient documentation

## 2022-06-09 DIAGNOSIS — K625 Hemorrhage of anus and rectum: Secondary | ICD-10-CM | POA: Diagnosis not present

## 2022-06-09 NOTE — Patient Outreach (Signed)
**[name] De-Identified via Obfuscation**   Care Coordination   Follow Up Visit [name]   06/09/2022 [name]: [name] MRN: [id number] DOB: [birth year]  [name] Bryant a 61 y.o. year old female who sees Luking, Elayne Snare, MD Bryant primary care. I spoke with  Lanae Boast by phone today.  What matters to the patients health and wellness today?   Chest pain night and day - does not have GERD medicine  "Feels like someone put a stick by down my throat" while she was eating chicken. She denies having a piece of bone lodged in her throat.She reports [name] a "sharp pain" that continues today  She continues to smoke  Has not received her nebulizer yet She reports Denise was ordered by her pulmonologist  She has not been to [place] to obtain her blood pressure cuff with her U Card    Goals Addressed               This Visit's Progress     Patient Stated     improve worsenting respiratory symptoms ([facility]) (pt-stated)   Not on track     Care Coordination Interventions: Evaluation of current treatment plan related to obstructive sleep apnea, need Bryant new CPAP and patient's adherence to plan Bryant established by provider Discussed plans with patient Bryant ongoing care management follow up and provided patient with direct contact information Bryant care management team Confirmed she has not received a nebulizer not CPAP ordered by her  pulmonologist Confirmed she continues to smoke      maintain control of blood pressure at home [facility]) (pt-stated)   Not on track     Care Coordination Interventions: Evaluation of current treatment plan related to hypertension self management and patient's adherence to plan Bryant established by provider Discussed plans with patient Bryant ongoing care management follow up and provided patient with direct contact information Bryant care management team Assessed the chest pain she reports having, confirmed she has not used GERD medication  Encouraged to seek evaluation  Assessed if she has purchased her blood pressure  cuff Encouragement provided            SDOH assessments and interventions completed:  No     Care Coordination Interventions:  Yes, provided   Follow up plan: Follow up call scheduled Bryant 06/19/22    Encounter Outcome:  Pt. Visit Completed   Lachanda Buczek L. Lavina Hamman, RN, BSN, [place] Coordinator Office number [phone]

## 2022-06-09 NOTE — Patient Instructions (Signed)
Visit Information  Thank you for taking time to visit with me today. Please don't hesitate to contact me if I can be of assistance to you.   Following are the goals we discussed today:   Goals Addressed               This Visit's Progress     Patient Stated     improve worsenting respiratory symptoms ([facility]) (pt-stated)   Not on track     Care Coordination Interventions: Evaluation of current treatment plan related to obstructive sleep apnea, need for new CPAP and patient's adherence to plan as established by provider Discussed plans with patient for ongoing care management follow up and provided patient with direct contact information for care management team Confirmed she has not received a nebulizer not CPAP ordered by her  pulmonologist Confirmed she continues to smoke      maintain control of blood pressure at home [facility]) (pt-stated)   Not on track     Care Coordination Interventions: Evaluation of current treatment plan related to hypertension self management and patient's adherence to plan as established by provider Discussed plans with patient for ongoing care management follow up and provided patient with direct contact information for care management team Assessed the chest pain she reports having, confirmed she has not used GERD medication  Encouraged to seek evaluation  Assessed if she has purchased her blood pressure cuff Encouragement provided            Our next appointment is by telephone on 06/19/22 at 1 pm  Please call the care guide team at [phone] if you need to cancel or reschedule your appointment.   If you are experiencing a Mental Health or [place] or need someone to talk to, please call the Suicide and Crisis Lifeline: 988 call the [place] National Suicide Prevention Lifeline: 551-657-2570 or TTY: (810) 483-2337 TTY (848)071-7108) to talk to a trained counselor call 1-800-273-TALK (toll free, 24 hour hotline) call the [facility]: [phone] call 911   The patient verbalized understanding of instructions, educational materials, and care plan provided today and DECLINED offer to receive copy of patient instructions, educational materials, and care plan.   The patient has been provided with contact information for the care management team and has been advised to call with any health related questions or concerns.   Rei Medlen L. Lavina Hamman, RN, BSN, [place] Coordinator Office number [phone]

## 2022-06-09 NOTE — Telephone Encounter (Signed)
Loma Sousa, please reach out to patient and Education officer, museum, [name].   We received medication list from Rx care.  According to this list she is taking loperamide (Imodium) 2 mg every day, she is also taking senna 8.6 mg 2 tablets every day.  These 2 together is counterproductive, 1 for constipation and 1 for diarrhea.    At office visit she complained of predominance of constipation with straining and rectal pain.    She is also taking dicyclomine 10 mg 3 times daily as needed for abdominal cramps/loose stools.  Patient told me in the office that she is taking 2 every day.  We need to simplify her bowel regimen.  Lets discontinue dicyclomine and loperamide for now.  She also has scheduled hydrocortisone cream twice daily and hemorrhoid suppositories twice daily as needed.  Can we clarify if she is taking both of these every day chronically because she is getting these filled every month.  She should not be taking these long-term but intermittently as needed for hemorrhoid or rectal pain.  She needs a follow-up office visit in 4 weeks.

## 2022-06-09 NOTE — Progress Notes (Signed)
GI Office Note    Referring Provider: Kathyrn Drown, MD Primary Care Physician:  Kathyrn Drown, MD  Primary Gastroenterologist: Garfield Cornea, MD   Chief Complaint   Chief Complaint  Patient presents with   Constipation    Having to strain, never got miralax. States that she uses suppositories once to twice daily and it only helps some with the rectal pain. Had a bm yesterday and she had to strain causing bleeding. Had a loose stool today. States that she has rectal pain all the time as well.     History of Present Illness   [name] is a 61 y.o. female presenting today for follow-up.  Last seen in October.  She has a history of GERD, IBS, dysphagia, hemorrhoids.  She has a history of H. pylori on biopsies and needs H. pylori eradication testing.  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  Surveillance colonoscopy 01/2022, with multiple colonic polyps, colonic lipoma, negative stool studies and negative microscopic colitis.  Needs surveillance in 2026. Stool studies negative in July 2023.  Patient presents with social worker, Janett Billow, who is filling in for [place] today. Patient did not bring medication list. She states she has a new pill. Janett Billow reached out to [place] who states medications have not changed. She reports that all medications come from [place]. Patient states she gets some from [place] and they are not in packets like from [place]. Patient is somewhat unreliable historian.   She states she is having brbpr, noted on tissue, in the toilet, mixed in stool. Happened several times the other day. States she has painful BMs. Has to strain. Passes some stool each day, lately [place] 1-3. She says she is not taking loperamide. She says she is taking dicyclomine twice daily but it is not clear. She also has some intermittent diarrhea but patient says mostly constipation. Heartburn id she drinks something she is not  supposed to like OJ or Koolaid.   Review of medication reconciliation on EPIC shows receiving 90 dicyclomine monthly and received loperamide #30 05/23/22. She is also getting GS hemorrhoidal suppositories #12 every couple of weeks prescribed by PCP. She has anusol cream she gets every two weeks from PCP. We are trying to confirm medication list.   Medications   PATIENT DID NOT BRING HER LIST, list has been updated after visit, current list below.   Current Outpatient Medications  Medication Sig Dispense Refill   atorvastatin (LIPITOR) 10 MG tablet Take 1 tablet (10 mg total) by mouth daily. 30 tablet 6   benztropine (COGENTIN) 1 MG tablet Take 1 tablet (1 mg total) by mouth daily. 30 tablet 5   cycloSPORINE (RESTASIS) 0.05 % ophthalmic emulsion Place 1 drop into both eyes 2 (two) times daily.     dicyclomine (BENTYL) 10 MG capsule TAKE 1 CAPSULE BY MOUTH UP TO 3 TIMES DAILY BEFORE MEALS AS NEEDED FOR ABDOMINAL CRAMPS/LOOSE STOOLS. (Patient taking differently: Take 10 mg by mouth 3 (three) times daily as needed (ABDOMINAL CRAMPS/LOOSE STOOLS.).) 90 capsule 5   famotidine (PEPCID) 20 MG tablet Take 1 tablet (20 mg total) by mouth 2 (two) times daily. 60 tablet 5   GOODSENSE HEMORRHOIDAL 0.25-88.44 % suppository UNWRAP AND PLACE 1 SUPPOSITORY RECTALLY 2 TIMES DAILY AS NEEDED FOR HEMORRHOIDS OR ANAL ITCHING (Patient taking differently: Place 1 suppository rectally 2 (two) times daily as needed for hemorrhoids.) 12 suppository 10   hydrocortisone (ANUSOL-HC) 2.5 %  rectal cream PLACE 1 APPLICATION RECTALLY 2 TIMES DAILY. 30 g PRN   hydrocortisone (ANUSOL-HC) 25 MG suppository Place 25 mg rectally 2 (two) times daily.     lamoTRIgine (LAMICTAL) 100 MG tablet Take 1 tablet (100 mg total) by mouth 2 (two) times daily. 60 tablet 5   loperamide (IMODIUM A-D) 2 MG tablet Take 1 tablet (2 mg total) by mouth daily. (Patient taking differently: Take 2 mg by mouth daily.) 30 tablet 2   paliperidone (INVEGA) 9  MG 24 hr tablet Take 1 tablet (9 mg total) by mouth daily. 30 tablet 5   potassium chloride (KLOR-CON) 10 MEQ tablet TAKE 2 TABLETS (20MEQ) BY MOUTH TWICE DAILY WITH FOOD 120 tablet 5   propranolol (INDERAL) 10 MG tablet TAKE 1 TABLET BY MOUTH THREE TIMES A DAY. 90 tablet 5   RABEprazole (ACIPHEX) 20 MG tablet TAKE 1 TABLET BY MOUTH TWICE A DAY. (Patient taking differently: Take 20 mg by mouth in the morning and at bedtime.) 180 tablet 3   senna (SENOKOT) 8.6 MG TABS tablet Take 2 tablets by mouth daily.     sertraline (ZOLOFT) 100 MG tablet Take 100 mg by mouth daily.     torsemide (DEMADEX) 20 MG tablet TAKE (1) TABLET BY MOUTH EACH MORNING. (Patient taking differently: Take 20 mg by mouth daily.) 30 tablet 5   traZODone (DESYREL) 100 MG tablet TAKE 2 TABLETS(200MG ) BY MOUTH AT BEDTIME. 60 tablet 5   TRELEGY ELLIPTA 100-62.5-25 MCG/ACT AEPB INHALE (1) PUFF INTO THE LUNGS ONCE DAILY. 60 each 5   No current facility-administered medications for this visit.    Allergies   Allergies as of 06/09/2022 - Review Complete 06/09/2022  Allergen Reaction Noted   Thorazine [chlorpromazine hcl] Anaphylaxis 10/20/2010   Acetaminophen Other (See Comments)    Aspirin Other (See Comments)    Aspirin-acetaminophen-caffeine Other (See Comments)    Nsaids Nausea And Vomiting    Other  03/22/2019   Penicillins Nausea And Vomiting    Tomato Rash 01/17/2012        Review of Systems   General: Negative for anorexia, weight loss, fever, chills, fatigue, weakness. ENT: Negative for hoarseness, difficulty swallowing , nasal congestion. CV: Negative for chest pain, angina, palpitations, dyspnea on exertion, peripheral edema.  Respiratory: Negative for dyspnea at rest, dyspnea on exertion, cough, sputum, wheezing.  GI: See history of present illness. GU:  Negative for dysuria, hematuria, urinary incontinence, urinary frequency, nocturnal urination.  Endo: Negative for unusual weight change.     Physical  Exam   BP 133/85 (BP Location: Left Arm, Patient Position: Sitting, Cuff Size: Large)   Pulse 93   Temp 98.2 F (36.8 C) (Oral)   Ht 5\' 10"  (1.778 m)   Wt 251 lb 6.4 oz (114 kg)   SpO2 96%   BMI 36.07 kg/m    General: Well-nourished, well-developed in no acute distress. Ambulates with walker, slowing.  Eyes: No icterus. Mouth: Oropharyngeal mucosa moist and pink , no lesions erythema or exudate. Lungs: Clear to auscultation bilaterally.  Heart: Regular rate and rhythm, no murmurs rubs or gallops.  Abdomen: Bowel sounds are normal,  nondistended, no hepatosplenomegaly or masses, no abdominal bruits or hernia , no rebound or guarding. Mild diffuse abdominal tenderness to palpation Rectal: not performed Extremities: No lower extremity edema. No clubbing or deformities. Neuro: Alert and oriented x 4   Skin: Warm and dry, no jaundice.   Psych: Alert and cooperative, normal mood and affect.  Labs   Lab  Results  Component Value Date   CREATININE 0.83 03/23/2022   BUN 9 03/23/2022   NA 138 03/23/2022   K 3.7 03/23/2022   CL 97 (L) 03/23/2022   CO2 30 03/23/2022   Lab Results  Component Value Date   ALT 17 01/03/2022   AST 27 01/03/2022   ALKPHOS 103 01/03/2022   BILITOT 0.3 01/03/2022   Lab Results  Component Value Date   WBC 4.1 03/23/2022   HGB 13.9 03/23/2022   HCT 42.4 03/23/2022   MCV 95.9 03/23/2022   PLT 149 (L) 03/23/2022   Lab Results  Component Value Date   HGBA1C 6.3 (H) 01/03/2022    Imaging Studies   No results found.  Assessment   Rectal pain/rectal bleeding: recent colonoscopy reassuring. Suspect anorectal fissure/hemorrhoid.   Constipation: predominance of constipation currently. Trying to determine medication list and what she is taking. Potentially exacerbated by dicyclomine if she is taking regularly.    PLAN   Requesting copy of medication list from [place]. Per Education officer, museum, [name], she gets all of her medication from [place].  Once  medication list reviewed, we will make further recommendations regarding bowel function, rectal pain/bleeding.    Laureen Ochs. Bobby Rumpf, [place], [facility]

## 2022-06-09 NOTE — Patient Instructions (Signed)
We will request copy of medications from your pharmacy. Further recommendations to follow.

## 2022-06-13 NOTE — Telephone Encounter (Signed)
Spoke to pt, she informed me that she is taking senna 8.6 daily. She states she is not taking Imodium at all. She takes 2 dicyclomine 10mg  daily, because she was scared to take 3 daily. She is using the hydrocortisone cream and the hemorrhoid suppositories daily. I informed her to stop the dicyclomine and Imodium. Pt voiced understanding. Reminder her of her OV on Jan 10 at 4:00. Also called social worker [name] and  left a message.

## 2022-06-13 NOTE — Telephone Encounter (Signed)
Noted. She gets her medications in pill packs so I wonder if she is just not taking those medications. Either way, I agree with advise given.   We will address the hemorrhoid creams/supp at next ove.

## 2022-06-15 ENCOUNTER — Telehealth: Payer: Self-pay

## 2022-06-15 NOTE — Telephone Encounter (Signed)
Please advise. Thank you

## 2022-06-15 NOTE — Telephone Encounter (Signed)
Caller name: MERISA JULIO  On DPR?: Yes  Call back number: [phone] (mobile)  Provider they see: Kathyrn Drown, MD  Reason for call:FL2 form dropped off to be completed.

## 2022-06-15 NOTE — Telephone Encounter (Signed)
Form was filled out as best as possible

## 2022-06-19 ENCOUNTER — Ambulatory Visit: Payer: Medicare Other | Admitting: Pulmonary Disease

## 2022-06-19 ENCOUNTER — Telehealth: Payer: Self-pay

## 2022-06-19 ENCOUNTER — Ambulatory Visit: Payer: Self-pay | Admitting: *Deleted

## 2022-06-19 NOTE — Progress Notes (Deleted)
Synopsis: Referred in October 2022 for lung nodule by Kathyrn Drown, MD  Subjective:   PATIENT ID: [name] GENDER: female DOB: 05/04/1961, MRN: [id number]  No chief complaint on file.   This is a 61 year old female, past medical history of diabetes, GERD, hypertension, hyperglycemia, MI, sleep apnea on CPAP.  Patient has been followed for a long time by Dr. Pia Mau thoracic surgery for evaluation of a right middle lobe lung nodule.  Lung nodule is macrolobulated and adjacent to the right heart border.  She had a recent nuclear medicine PET scan that showed PET avid uptake within the right heart border.  Unclear whether or not this is correlates with the nodule lesion on CT.  There could be some CT to PET scan body divergence on the overlay images.  There is also question whether or not this could represent a AVM.  OV 09/12/2021: Here today for follow-up.  Diagnosed with carcinoid tumor status post SBRT with radiation oncology.  From respiratory standpoint doing okay.  She still is a 50+ pack year history smoker.  Started smoking at age 86.  Has still smoking approximately 1 pack/day.  She lives alone.  Has had some recent falls at home.  Follows closely with primary care.  From a respiratory standpoint she feels short of breath at times.  She has morning cough and sputum production.  She had prior PFTs ordered but these have not been completed.  OV 06/19/2022: Here today for follow-up diagnosed with a carcinoid tumor status post SBRT.  Follow-up now for ongoing COPD.  Last seen in the office by Roxan Diesel, NP.At that time she was working on quitting smoking.  She had cut down to 5 cigarettes a day per last office visit.  Currently managed with Trelegy once daily for underlying COPD.  She is yet to complete her pulmonary function tests.  PFTs were complete prior to this office visit in November 2023.  PFTs reviewed today in the office which revealed a normal ratio, at 70 with severely impaired  spirometry postbronchodilator response FEV1 of 36%, TLC 83, ERV 14% and a DLCO of 65, likely has a mixed obstructive restrictive defect, restriction likely related to BMI.   Past Medical History:  Diagnosis Date  . Anxiety   . Aortic atherosclerosis ([place]) 09/19/2020   Seen on CAT scan 2021  . Arthritis   . Depression   . Diabetes mellitus   . Diabetes mellitus, type II ([place])   . Dyspnea   . Dysrhythmia   . GERD (gastroesophageal reflux disease)   . Heart murmur   . HTN (hypertension)   . Hyperglycemia   . IBS (irritable bowel syndrome)   . Lung nodules    right, followed by PCP, PET 11/2011  . Mental retardation   . MI (myocardial infarction) ([place])   . Migraines   . Pneumonia   . Sleep apnea    cpap   . Stroke [facility])      Family History  Problem Relation Age of Onset  . Stroke Mother   . Heart attack Father   . Schizophrenia Other   . Drug abuse Other   . Alcohol abuse Other   . Colon cancer Other        aunt  . Obesity Other   . COPD Other   . Anxiety disorder Other   . GER disease Other   . Diabetes type II Other   . Anxiety disorder Other   . Depression Other   .  Depression Sister   . Schizophrenia Sister   . Liver disease Neg Hx   . Inflammatory bowel disease Neg Hx      Past Surgical History:  Procedure Laterality Date  . ABDOMINAL HYSTERECTOMY    . BIOPSY  01/15/2020   Procedure: BIOPSY;  Surgeon: Daneil Dolin, MD;  [facility];  Service: Endoscopy;;  gastric  . BIOPSY  01/25/2022   Procedure: BIOPSY;  Surgeon: Daneil Dolin, MD;  [facility];  Service: Endoscopy;;  ascending;descending;sigmoid;  . BRONCHIAL BIOPSY  06/14/2021   Procedure: BRONCHIAL BIOPSIES;  Surgeon: Garner Nash, DO;  Location: [facility];  Service: Pulmonary;;  . BRONCHIAL BRUSHINGS  06/14/2021   Procedure: BRONCHIAL BRUSHINGS;  Surgeon: Garner Nash, DO;  Location: [place];  Service: Pulmonary;;  . BRONCHIAL NEEDLE ASPIRATION BIOPSY   06/14/2021   Procedure: BRONCHIAL NEEDLE ASPIRATION BIOPSIES;  Surgeon: Garner Nash, DO;  Location: [place];  Service: Pulmonary;;  . COLONOSCOPY  11/2007   hyperplastic polyps, prior hx of adenomas   . COLONOSCOPY  05/2010   incomplete due to poor prep, hyperplastic rectal polyp  . COLONOSCOPY  05/05/2002   Dimunitive polyps in the rectum and left colon, cold    biopsied/removed.  Scattered few left-sided diverticula.  Regular colonic   mucosa appeared normal  . COLONOSCOPY N/A 05/28/2013   Rourk: mulitple tubular adenomas removed. next tcs 05/2016  . COLONOSCOPY WITH PROPOFOL N/A 09/07/2016   Dr. Gala Romney: For hyperplastic polyps removed. Next colonoscopy March 2023 given history of adenomatous colon polyps in the past.  . COLONOSCOPY WITH PROPOFOL N/A 01/25/2022   Redundant/elongated colon, melanosis coli, left-sided diverticula, multiple colon polyps, colonic lipoma. Negative active colitis, adenomas. 3 year surveillance.  . ESOPHAGOGASTRODUODENOSCOPY  08/2007   moderate sized hiatal hernia  . ESOPHAGOGASTRODUODENOSCOPY  05/2010   noncritical schatzki ring s/p 67F  . ESOPHAGOGASTRODUODENOSCOPY (EGD) WITH ESOPHAGEAL DILATION N/A 02/06/2013   QIW:LNLGXQ esophagus-s/p dilation up to a 51 [place] size with [facility] dilators.  Hiatal hernia  . ESOPHAGOGASTRODUODENOSCOPY (EGD) WITH PROPOFOL N/A 09/07/2016   Dr. Gala Romney: Normal, status post empiric dilation of the esophagus for history of dysphagia  . ESOPHAGOGASTRODUODENOSCOPY (EGD) WITH PROPOFOL N/A 05/02/2019   Normal esophagus s/p dilation, normal stomach, normal duodenum  . ESOPHAGOGASTRODUODENOSCOPY (EGD) WITH PROPOFOL N/A 01/15/2020   normal esophagus s/p dilation, gastric nodule s/p biopsy. This showed reactive gastropathy with H.pylori.   Marland Kitchen EXTERNAL EAR SURGERY     bilateral  . FIDUCIAL MARKER PLACEMENT  06/14/2021   Procedure: FIDUCIAL MARKER PLACEMENT;  Surgeon: Garner Nash, DO;  Location: [facility];  Service:  Pulmonary;;  . FOOT SURGERY    . GLAUCOMA SURGERY    . KNEE ARTHROPLASTY Right 11/04/2020   Procedure: COMPUTER ASSISTED TOTAL KNEE ARTHROPLASTY;  Surgeon: Rod Can, MD;  Location: [facility];  Service: Orthopedics;  Laterality: Right;  . MALONEY DILATION N/A 09/07/2016   Procedure: Venia Minks DILATION;  Surgeon: Daneil Dolin, MD;  [facility];  Service: Endoscopy;  Laterality: N/A;  . Venia Minks DILATION N/A 05/02/2019   Procedure: Venia Minks DILATION;  Surgeon: Daneil Dolin, MD;  [facility];  Service: Endoscopy;  Laterality: N/A;  . Venia Minks DILATION N/A 01/15/2020   Procedure: Venia Minks DILATION;  Surgeon: Daneil Dolin, MD;  [facility];  Service: Endoscopy;  Laterality: N/A;  . POLYPECTOMY  09/07/2016   Procedure: POLYPECTOMY;  Surgeon: Daneil Dolin, MD;  [facility];  Service: Endoscopy;;  sigmoid colon x4  . POLYPECTOMY  01/25/2022   Procedure: POLYPECTOMY;  Surgeon: Daneil Dolin, MD;  [facility];  Service: Endoscopy;;  hepatic flexure and descending/sigmoid colon  . small bowel capsule  10/2007   normal  . VIDEO BRONCHOSCOPY WITH RADIAL ENDOBRONCHIAL ULTRASOUND  06/14/2021   Procedure: VIDEO BRONCHOSCOPY WITH RADIAL ENDOBRONCHIAL ULTRASOUND;  Surgeon: Garner Nash, DO;  Location: [place] [facility];  Service: Pulmonary;;    Social History   Socioeconomic History  . Marital status: Divorced    Spouse name: Not on file  . Number of children: Not on file  . Years of education: Not on file  . Highest education level: High school graduate  Occupational History  . Occupation: disabled    Fish farm manager: UNEMPLOYED  Tobacco Use  . Smoking status: Every Day    Packs/day: 0.50    Years: 40.00    Total pack years: 20.00    Types: Cigarettes    Passive exposure: Current  . Smokeless tobacco: Never  Vaping Use  . Vaping Use: Never used  Substance and Sexual Activity  . Alcohol use: No  . Drug use: No  . Sexual activity:  Not Currently    Partners: Male    Birth control/protection: Surgical    Comment: hyst  Other Topics Concern  . Not on file  Social History Narrative  . Not on file   Social Determinants of Health   Financial Resource Strain: Low Risk  (05/02/2022)   Overall Financial Resource Strain (CARDIA)   . Difficulty of Paying Living Expenses: Not hard at all  Food Insecurity: No Food Insecurity (05/02/2022)   Hunger Vital Sign   . Worried About Charity fundraiser in the Last Year: Never true   . Ran Out of Food in the Last Year: Never true  Transportation Needs: No Transportation Needs (05/10/2022)   PRAPARE - Transportation   . Lack of Transportation (Medical): No   . Lack of Transportation (Non-Medical): No  Physical Activity: Insufficiently Active (05/02/2022)   Exercise Vital Sign   . Days of Exercise per Week: 3 days   . Minutes of Exercise per Session: 30 min  Stress: No Stress Concern Present (05/10/2022)   [place]   . Feeling of Stress : Only a little  Social Connections: Socially Isolated (05/02/2022)   Social Connection and Isolation Panel [NHANES]   . Frequency of Communication with Friends and Family: Twice a week   . Frequency of Social Gatherings with Friends and Family: Once a week   . Attends Religious Services: Never   . Active Member of Clubs or Organizations: No   . Attends Archivist Meetings: Never   . Marital Status: Divorced  Human resources officer Violence: Not At Risk (05/02/2022)   Humiliation, Afraid, Rape, and Kick questionnaire   . Fear of Current or Ex-Partner: No   . Emotionally Abused: No   . Physically Abused: No   . Sexually Abused: No     Allergies  Allergen Reactions  . Thorazine [Chlorpromazine Hcl] Anaphylaxis  . Acetaminophen Other (See Comments)    Makes pt dizzy  . Aspirin Other (See Comments)    seizure  . Aspirin-Acetaminophen-Caffeine Other (See Comments)     seizure  . Nsaids Nausea And Vomiting  . Other     Acidic foods  . Penicillins Nausea And Vomiting    Has patient had a PCN reaction causing immediate rash, facial/tongue/throat swelling, SOB or  lightheadedness with hypotension:Yes Has patient had a PCN reaction causing severe rash involving mucus membranes or skin necrosis:Yes Has patient had a PCN reaction that required hospitalization:Yes Has patient had a PCN reaction occurring within the last 10 years:No If all of the above answers are "NO", then may proceed with Cephalosporin use.   . Tomato Rash     Outpatient Medications Prior to Visit  Medication Sig Dispense Refill  . atorvastatin (LIPITOR) 10 MG tablet Take 1 tablet (10 mg total) by mouth daily. 30 tablet 6  . benztropine (COGENTIN) 1 MG tablet Take 1 tablet (1 mg total) by mouth daily. 30 tablet 5  . cycloSPORINE (RESTASIS) 0.05 % ophthalmic emulsion Place 1 drop into both eyes 2 (two) times daily.    Marland Kitchen dicyclomine (BENTYL) 10 MG capsule TAKE 1 CAPSULE BY MOUTH UP TO 3 TIMES DAILY BEFORE MEALS AS NEEDED FOR ABDOMINAL CRAMPS/LOOSE STOOLS. (Patient taking differently: Take 10 mg by mouth 3 (three) times daily as needed (ABDOMINAL CRAMPS/LOOSE STOOLS.).) 90 capsule 5  . famotidine (PEPCID) 20 MG tablet Take 1 tablet (20 mg total) by mouth 2 (two) times daily. 60 tablet 5  . GOODSENSE HEMORRHOIDAL 0.25-88.44 % suppository UNWRAP AND PLACE 1 SUPPOSITORY RECTALLY 2 TIMES DAILY AS NEEDED FOR HEMORRHOIDS OR ANAL ITCHING (Patient taking differently: Place 1 suppository rectally 2 (two) times daily as needed for hemorrhoids.) 12 suppository 10  . hydrocortisone (ANUSOL-HC) 2.5 % rectal cream PLACE 1 APPLICATION RECTALLY 2 TIMES DAILY. 30 g PRN  . hydrocortisone (ANUSOL-HC) 25 MG suppository Place 25 mg rectally 2 (two) times daily.    Marland Kitchen lamoTRIgine (LAMICTAL) 100 MG tablet Take 1 tablet (100 mg total) by mouth 2 (two) times daily. 60 tablet 5  . loperamide (IMODIUM A-D) 2 MG tablet Take 1  tablet (2 mg total) by mouth daily. (Patient taking differently: Take 2 mg by mouth daily.) 30 tablet 2  . paliperidone (INVEGA) 9 MG 24 hr tablet Take 1 tablet (9 mg total) by mouth daily. 30 tablet 5  . potassium chloride (KLOR-CON) 10 MEQ tablet TAKE 2 TABLETS (20MEQ) BY MOUTH TWICE DAILY WITH FOOD 120 tablet 5  . propranolol (INDERAL) 10 MG tablet TAKE 1 TABLET BY MOUTH THREE TIMES A DAY. 90 tablet 5  . RABEprazole (ACIPHEX) 20 MG tablet TAKE 1 TABLET BY MOUTH TWICE A DAY. (Patient taking differently: Take 20 mg by mouth in the morning and at bedtime.) 180 tablet 3  . senna (SENOKOT) 8.6 MG TABS tablet Take 2 tablets by mouth daily.    . sertraline (ZOLOFT) 100 MG tablet Take 100 mg by mouth daily.    Marland Kitchen torsemide (DEMADEX) 20 MG tablet TAKE (1) TABLET BY MOUTH EACH MORNING. (Patient taking differently: Take 20 mg by mouth daily.) 30 tablet 5  . traZODone (DESYREL) 100 MG tablet TAKE 2 TABLETS(200MG ) BY MOUTH AT BEDTIME. 60 tablet 5  . TRELEGY ELLIPTA 100-62.5-25 MCG/ACT AEPB INHALE (1) PUFF INTO THE LUNGS ONCE DAILY. 60 each 5   No facility-administered medications prior to visit.    Review of Systems  Constitutional:  Negative for chills, fever, malaise/fatigue and weight loss.  HENT:  Negative for hearing loss, sore throat and tinnitus.   Eyes:  Negative for blurred vision and double vision.  Respiratory:  Positive for cough and shortness of breath. Negative for hemoptysis, sputum production, wheezing and stridor.   Cardiovascular:  Negative for chest pain, palpitations, orthopnea, leg swelling and PND.  Gastrointestinal:  Negative for abdominal pain, constipation, diarrhea, heartburn, nausea  and vomiting.  Genitourinary:  Negative for dysuria, hematuria and urgency.  Musculoskeletal:  Negative for joint pain and myalgias.  Skin:  Negative for itching and rash.  Neurological:  Negative for dizziness, tingling, weakness and headaches.  Endo/Heme/Allergies:  Negative for environmental  allergies. Does not bruise/bleed easily.  Psychiatric/Behavioral:  Negative for depression. The patient is not nervous/anxious and does not have insomnia.   All other systems reviewed and are negative.    Objective:  Physical Exam Vitals reviewed.  Constitutional:      General: She is not in acute distress.    Appearance: She is well-developed. She is obese.  HENT:     Head: Normocephalic and atraumatic.  Eyes:     General: No scleral icterus.    Conjunctiva/sclera: Conjunctivae normal.     Pupils: Pupils are equal, round, and reactive to light.  Neck:     Vascular: No JVD.     Trachea: No tracheal deviation.  Cardiovascular:     Rate and Rhythm: Normal rate and regular rhythm.     Heart sounds: Normal heart sounds. No murmur heard. Pulmonary:     Effort: Pulmonary effort is normal. No tachypnea, accessory muscle usage or respiratory distress.     Breath sounds: No stridor. No wheezing, rhonchi or rales.  Abdominal:     General: There is no distension.     Palpations: Abdomen is soft.     Tenderness: There is no abdominal tenderness.  Musculoskeletal:        General: No tenderness.     Cervical back: Neck supple.  Lymphadenopathy:     Cervical: No cervical adenopathy.  Skin:    General: Skin is warm and dry.     Capillary Refill: Capillary refill takes less than 2 seconds.     Findings: No rash.  Neurological:     Mental Status: She is alert and oriented to person, place, and time.  Psychiatric:        Behavior: Behavior normal.     There were no vitals filed for this visit.    on RA BMI Readings from Last 3 Encounters:  06/09/22 36.07 kg/m  05/02/22 35.08 kg/m  04/24/22 35.08 kg/m   Wt Readings from Last 3 Encounters:  06/09/22 251 lb 6.4 oz (114 kg)  05/02/22 248 lb (112.5 kg)  04/24/22 248 lb (112.5 kg)     CBC    Component Value Date/Time   WBC 4.1 03/23/2022 1344   RBC 4.42 03/23/2022 1344   HGB 13.9 03/23/2022 1344   HGB 12.8 03/03/2021  1101   HCT 42.4 03/23/2022 1344   HCT 37.9 03/03/2021 1101   PLT 149 (L) 03/23/2022 1344   PLT 184 03/03/2021 1101   MCV 95.9 03/23/2022 1344   MCV 84 03/03/2021 1101   MCH 31.4 03/23/2022 1344   MCHC 32.8 03/23/2022 1344   RDW 14.4 03/23/2022 1344   RDW 14.6 03/03/2021 1101   LYMPHSABS 1.3 09/14/2021 1420   LYMPHSABS 1.6 03/03/2021 1101   MONOABS 0.6 09/14/2021 1420   EOSABS 0.0 09/14/2021 1420   EOSABS 0.0 03/03/2021 1101   BASOSABS 0.0 09/14/2021 1420   BASOSABS 0.0 03/03/2021 1101     Chest Imaging: 03/04/2021 CT chest: Patient with right middle lobe pulmonary nodule slowly enlarging over time could represent indolent neoplasm it is macrolobulated associated preclosed to the vessels.  There was a concern whether or not this could be a AVM.  Does not appear that she has had a CTA.  04/01/2021:  Nuclear medicine pet imaging with PET avid uptake on the right heart border adjacent to the same area as the nodule.  There could be some CT PET scan divergence on the overlay.  The hypermetabolic area could represent where the nodules located at does potentially appear this way. May be dealing with a indolent neoplasm. The patient's images have been independently reviewed by me.    Pulmonary Functions Testing Results:    Latest Ref Rng & Units 05/18/2022   11:27 AM  PFT Results  FVC-Pre L 1.65   FVC-Predicted Pre % 44   FVC-Post L 1.48   FVC-Predicted Post % 39   Pre FEV1/FVC % % 74   Post FEV1/FCV % % 70   FEV1-Pre L 1.22   FEV1-Predicted Pre % 42   FEV1-Post L 1.04   DLCO uncorrected ml/min/mmHg 14.75   DLCO UNC% % 65   DLCO corrected ml/min/mmHg 14.53   DLCO COR %Predicted % 64   DLVA Predicted % 110   TLC L 4.59   TLC % Predicted % 83   RV % Predicted % 128    FeNO:   Pathology:   Echocardiogram:   Heart Catheterization:     Assessment & Plan:   No diagnosis found.   Discussion:  This is a 61 year old female, obese, history of OSA, longstanding history of  tobacco use, 50+ years found to have a nodule in the right lung that was taken for navigational bronchoscopy and biopsy positive for carcinoid tumor.  Patient was referred for SBRT as she was felt not to be a good candidate for surgical resection by cardiothoracic surgery  Plan: She is a longstanding smoking history likely has a mixed pattern of potential COPD versus restrictive disease related to her morbid obesity. She needs to quit smoking, please see separate documentation regarding smoking cessation counseling. She would likely also benefit from a inhaler regimen. I will start her with Trelegy samples to see if this helps. I think if she is breathing better she would be more apt to considerations for quitting smoking.  She seems to be depressed when she is restricted from her respiratory standpoint not able to do the things she wants to do.  So she ends up sitting and smoking more.  She has follow-up already scheduled with radiation oncology for repeat CT imaging. Once she is done with her surveillance CT imaging she will need to be reenrolled in our lung cancer screening program. At minimum start of lung cancer screening program in April 2024. I will place referral. Return to clinic to see [place] in July 2023 hopefully smoke-free. Smoking cessation date planned for July 4.   Current Outpatient Medications:  .  atorvastatin (LIPITOR) 10 MG tablet, Take 1 tablet (10 mg total) by mouth daily., Disp: 30 tablet, Rfl: 6 .  benztropine (COGENTIN) 1 MG tablet, Take 1 tablet (1 mg total) by mouth daily., Disp: 30 tablet, Rfl: 5 .  cycloSPORINE (RESTASIS) 0.05 % ophthalmic emulsion, Place 1 drop into both eyes 2 (two) times daily., Disp: , Rfl:  .  dicyclomine (BENTYL) 10 MG capsule, TAKE 1 CAPSULE BY MOUTH UP TO 3 TIMES DAILY BEFORE MEALS AS NEEDED FOR ABDOMINAL CRAMPS/LOOSE STOOLS. (Patient taking differently: Take 10 mg by mouth 3 (three) times daily as needed (ABDOMINAL CRAMPS/LOOSE STOOLS.).),  Disp: 90 capsule, Rfl: 5 .  famotidine (PEPCID) 20 MG tablet, Take 1 tablet (20 mg total) by mouth 2 (two) times daily., Disp: 60 tablet, Rfl: 5 .  GOODSENSE HEMORRHOIDAL  0.25-88.44 % suppository, UNWRAP AND PLACE 1 SUPPOSITORY RECTALLY 2 TIMES DAILY AS NEEDED FOR HEMORRHOIDS OR ANAL ITCHING (Patient taking differently: Place 1 suppository rectally 2 (two) times daily as needed for hemorrhoids.), Disp: 12 suppository, Rfl: 10 .  hydrocortisone (ANUSOL-HC) 2.5 % rectal cream, PLACE 1 APPLICATION RECTALLY 2 TIMES DAILY., Disp: 30 g, Rfl: PRN .  hydrocortisone (ANUSOL-HC) 25 MG suppository, Place 25 mg rectally 2 (two) times daily., Disp: , Rfl:  .  lamoTRIgine (LAMICTAL) 100 MG tablet, Take 1 tablet (100 mg total) by mouth 2 (two) times daily., Disp: 60 tablet, Rfl: 5 .  loperamide (IMODIUM A-D) 2 MG tablet, Take 1 tablet (2 mg total) by mouth daily. (Patient taking differently: Take 2 mg by mouth daily.), Disp: 30 tablet, Rfl: 2 .  paliperidone (INVEGA) 9 MG 24 hr tablet, Take 1 tablet (9 mg total) by mouth daily., Disp: 30 tablet, Rfl: 5 .  potassium chloride (KLOR-CON) 10 MEQ tablet, TAKE 2 TABLETS (20MEQ) BY MOUTH TWICE DAILY WITH FOOD, Disp: 120 tablet, Rfl: 5 .  propranolol (INDERAL) 10 MG tablet, TAKE 1 TABLET BY MOUTH THREE TIMES A DAY., Disp: 90 tablet, Rfl: 5 .  RABEprazole (ACIPHEX) 20 MG tablet, TAKE 1 TABLET BY MOUTH TWICE A DAY. (Patient taking differently: Take 20 mg by mouth in the morning and at bedtime.), Disp: 180 tablet, Rfl: 3 .  senna (SENOKOT) 8.6 MG TABS tablet, Take 2 tablets by mouth daily., Disp: , Rfl:  .  sertraline (ZOLOFT) 100 MG tablet, Take 100 mg by mouth daily., Disp: , Rfl:  .  torsemide (DEMADEX) 20 MG tablet, TAKE (1) TABLET BY MOUTH EACH MORNING. (Patient taking differently: Take 20 mg by mouth daily.), Disp: 30 tablet, Rfl: 5 .  traZODone (DESYREL) 100 MG tablet, TAKE 2 TABLETS(200MG ) BY MOUTH AT BEDTIME., Disp: 60 tablet, Rfl: 5 .  TRELEGY ELLIPTA 100-62.5-25  MCG/ACT AEPB, INHALE (1) PUFF INTO THE LUNGS ONCE DAILY., Disp: 60 each, Rfl: [place], DO [facility] 06/19/2022 8:32 AM

## 2022-06-19 NOTE — Patient Outreach (Signed)
  Care Coordination   Follow Up Visit Note   06/19/2022 Name: [name] MRN: [id number] DOB: Jun 04, 1961  [name] is a 61 y.o. year old female who sees Luking, Elayne Snare, MD for primary care. I spoke with  Lanae Boast by phone today.  What matters to the patients health and wellness today?  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 need sleep study test for new CPAP   CAP Her CAP is old and broken, She is aware that she needs a sleep study in order to get a new one Noted she has a sleep consult scheduled for 07/18/21 1015      Goals Addressed               This Visit's Progress     Patient Stated     improve worsenting respiratory symptoms (THN) (pt-stated)   Not on track     Care Coordination Interventions: Evaluation of current treatment plan related to obstructive sleep apnea, need for new CPAP and patient's adherence to plan as established by provider Discussed plans with patient for ongoing care management follow up and provided patient with direct contact information for care management team Confirmed she was diagnosed with pneumonia  Confirmed she has not received a nebulizer not CPAP ordered by her  pulmonologist Confirmed she continues to smoke Outreach to Dr Valeta Harms office to inquire about a nebulizer for the patient. Spoke with Estill Bamberg who will send a message to Dr Valeta Harms after assessing for the need of a nebulizer Assessed CPAP  use, sleep study tests Discussed the availability of virtual &  telephone medical appointments       maintain control of blood pressure at home [facility]) (pt-stated)   Not on track     Care Coordination Interventions: Evaluation of current treatment plan related to hypertension self management and patient's adherence to plan as established by provider Discussed plans with patient for ongoing care management follow up and provided patient with direct contact information for care management team  Assessed if she has purchased her blood pressure cuff Encouragement provided   Spoke with April at [facility] to inquire if patient had any options to have a blood pressure cuff and a nebulizer delivered to her.  April confirmed no nebulizer orders and [facility] does not accept the U card for a blood pressure cuff. Will update patient          SDOH assessments and interventions completed:  No     Care Coordination Interventions:  Yes, provided   Follow up plan: Follow up call scheduled for 07/20/22    Encounter Outcome:  Pt. Visit Completed    Chelesea Weiand L. Lavina Hamman, RN, BSN, [place] Coordinator Office number [phone]

## 2022-06-19 NOTE — Telephone Encounter (Signed)
Spoke with Joelene Millin with [facility] who states pt says she was supposed to have a nebulizer and solution order for her. After review of pt's chart I did not see any mention of any nebulizer nor solution. Joelene Millin did states pt was having increased cough and has a recent dx of pneumonia. Dr. Valeta Harms may we have as order for pt to get a nebulizer and solution for pt?

## 2022-06-19 NOTE — Patient Instructions (Addendum)
Visit Information  Thank you for taking time to visit with me today. Please don't hesitate to contact me if I can be of assistance to you.   Following are the goals we discussed today:   Goals Addressed               This Visit's Progress     Patient Stated     improve worsenting respiratory symptoms (THN) (pt-stated)   Not on track     Care Coordination Interventions: Evaluation of current treatment plan related to obstructive sleep apnea, need for new CPAP and patient's adherence to plan as established by provider Discussed plans with patient for ongoing care management follow up and provided patient with direct contact information for care management team Confirmed she was diagnosed with pneumonia  Confirmed she has not received a nebulizer not CPAP ordered by her  pulmonologist Confirmed she continues to smoke Outreach to Dr Valeta Harms office to inquire about a nebulizer for the patient. Spoke with Estill Bamberg who will send a message to Dr Valeta Harms after assessing for the need of a nebulizer Assessed CPAP use, sleep study tests Discussed the availability of virtual &  telephone medical appointments       maintain control of blood pressure at home [facility]) (pt-stated)   Not on track     Care Coordination Interventions: Evaluation of current treatment plan related to hypertension self management and patient's adherence to plan as established by provider Discussed plans with patient for ongoing care management follow up and provided patient with direct contact information for care management team  Assessed if she has purchased her blood pressure cuff Encouragement provided   Spoke with April at [facility] to inquire if patient had any options to have a blood pressure cuff and a nebulizer delivered to her.  April confirmed no nebulizer orders and [facility] does not accept the U card for a blood pressure cuff. Will update patient          Our next appointment is by telephone on 07/20/22 at  1 pm  Please call the care guide team at [phone] if you need to cancel or reschedule your appointment.   If you are experiencing a Mental Health or [place] or need someone to talk to, please call the Suicide and Crisis Lifeline: 988 call the [place] National Suicide Prevention Lifeline: 618-368-9698 or TTY: 671-787-8027 TTY (615)882-9438) to talk to a trained counselor call 1-800-273-TALK (toll free, 24 hour hotline) call the [facility]: (628)140-2499 call 911   The patient verbalized understanding of instructions, educational materials, and care plan provided today and DECLINED offer to receive copy of patient instructions, educational materials, and care plan.   The patient has been provided with contact information for the care management team and has been advised to call with any health related questions or concerns.   Jamieon Lannen L. Lavina Hamman, RN, BSN, [place] Coordinator Office number [phone]

## 2022-06-20 ENCOUNTER — Telehealth: Payer: Self-pay | Admitting: Student

## 2022-06-20 DIAGNOSIS — R053 Chronic cough: Secondary | ICD-10-CM

## 2022-06-20 MED ORDER — ALBUTEROL SULFATE (2.5 MG/3ML) 0.083% IN NEBU: 2.5000 mg | INHALATION_SOLUTION | Freq: Four times a day (QID) | RESPIRATORY_TRACT | 12 refills | Status: AC | PRN

## 2022-06-20 NOTE — Telephone Encounter (Signed)
[name] from [facility] [place], called. States script put in is for nebulizer. PT does not have one. Need script for machine and tubing kit. Her CB # is [phone]. TY (Dr. Is Icard)

## 2022-06-20 NOTE — Telephone Encounter (Signed)
Albuterol filled per Dr Fabio Bering orders. Tried to call patient to get them reschedule for follow up. Nothing further needed

## 2022-06-21 NOTE — Telephone Encounter (Signed)
Order for pt to receive a nebulizer machine and supplies has been placed for this to be sent to [facility] in [place]. Nothing further needed.

## 2022-06-22 ENCOUNTER — Ambulatory Visit: Payer: Medicare Other | Admitting: Family Medicine

## 2022-06-27 ENCOUNTER — Telehealth: Payer: Self-pay | Admitting: Student

## 2022-06-27 NOTE — Telephone Encounter (Signed)
Reprinted the order for nebulizer machine and supplies and refaced to [facility] in [place]. Nothing further needed

## 2022-06-27 NOTE — Telephone Encounter (Signed)
Pharmacy calling on last telephone encounter. They needed a prescription for tubing kit & nebulizer machine and last request they got a huge volume of paperwork but no prescription. [facility] in [place]. CB # [phone]

## 2022-06-28 ENCOUNTER — Ambulatory Visit (INDEPENDENT_AMBULATORY_CARE_PROVIDER_SITE_OTHER): Payer: Medicare Other | Admitting: Family Medicine

## 2022-06-28 ENCOUNTER — Telehealth: Payer: Self-pay | Admitting: Student

## 2022-06-28 ENCOUNTER — Encounter: Payer: Self-pay | Admitting: Family Medicine

## 2022-06-28 VITALS — BP 120/72 | HR 87 | Temp 97.5°F | Ht 70.0 in | Wt 253.0 lb

## 2022-06-28 DIAGNOSIS — Z720 Tobacco use: Secondary | ICD-10-CM | POA: Diagnosis not present

## 2022-06-28 DIAGNOSIS — I10 Essential (primary) hypertension: Secondary | ICD-10-CM | POA: Diagnosis not present

## 2022-06-28 DIAGNOSIS — E785 Hyperlipidemia, unspecified: Secondary | ICD-10-CM | POA: Diagnosis not present

## 2022-06-28 DIAGNOSIS — H9191 Unspecified hearing loss, right ear: Secondary | ICD-10-CM

## 2022-06-28 DIAGNOSIS — I700 Atherosclerosis of aorta: Secondary | ICD-10-CM

## 2022-06-28 DIAGNOSIS — E119 Type 2 diabetes mellitus without complications: Secondary | ICD-10-CM

## 2022-06-28 DIAGNOSIS — E1169 Type 2 diabetes mellitus with other specified complication: Secondary | ICD-10-CM | POA: Diagnosis not present

## 2022-06-28 NOTE — Telephone Encounter (Signed)
See last encounter Fax for meds and kit did not go thru. Pharm can see only the edge of it and it cut off. TY  Fax # [phone]  April from [facility] called. TY 

## 2022-06-28 NOTE — Telephone Encounter (Signed)
Refaxed order for nebulizer machine and supplies to [facility] at [facility]. Nothing further needed

## 2022-06-28 NOTE — Progress Notes (Signed)
   Subjective:    Patient ID: [name], female    DOB: 1960/11/24, 61 y.o.   MRN: [id number]  HPI Patient comes in with quite a variety of symptoms.  She has been seen the pulmonologist.  Also not eating well.  Has poor eating habits as well as lack of exercise still smokes patient has mental health depression issues as well as developmental issues social services helps her but she is pretty much free to make her own choices Hearing loss of right ear, unspecified hearing loss type  Aortic atherosclerosis ([place])  Hyperlipidemia associated with type 2 diabetes mellitus ([place])  Diabetes mellitus without complication ([place])  Tobacco use   Review of Systems     Objective:   Physical Exam  General-in no acute distress Eyes-no discharge Lungs-respiratory rate normal, CTA CV-no murmurs,RRR Extremities skin warm dry no edema Neuro grossly normal Behavior normal, alert       Assessment & Plan:  1. Hearing loss of right ear, unspecified hearing loss type Very difficult to know how much she is truly losing her hearing she has a lot of symptomatology she would benefit from audiology exam but hopefully they can get an accurate reading Certainly is possible she is losing her hearing but sometimes it is very difficult to know  2. Aortic atherosclerosis ([place]) She was encouraged to take cholesterol medicine encouraged to get her labs checked  3. Hyperlipidemia associated with type 2 diabetes mellitus ([place]) Take cholesterol medicine get labs checked she is encouraged to eat healthier her habits are unfortunate  4. Diabetes mellitus without complication ([place]) Encouraged to continue medications she denies any low sugar spells check A1C  5. Tobacco use Patient was encouraged to quit smoking

## 2022-06-29 ENCOUNTER — Other Ambulatory Visit: Payer: Self-pay | Admitting: Internal Medicine

## 2022-06-29 ENCOUNTER — Encounter: Payer: Self-pay | Admitting: Family Medicine

## 2022-06-29 LAB — BASIC METABOLIC PANEL
BUN/Creatinine Ratio: 11 (ref 9–23)
BUN: 10 mg/dL (ref 6–24)
CO2: 29 mmol/L (ref 20–29)
Calcium: 9.1 mg/dL (ref 8.7–10.2)
Chloride: 97 mmol/L (ref 96–106)
Creatinine, Ser: 0.89 mg/dL (ref 0.57–1.00)
Glucose: 90 mg/dL (ref 70–99)
Potassium: 4.2 mmol/L (ref 3.5–5.2)
Sodium: 140 mmol/L (ref 134–144)
eGFR: 75 mL/min/{1.73_m2} (ref >59)

## 2022-06-29 LAB — HEPATIC FUNCTION PANEL
ALT: 16 IU/L (ref 0–32)
AST: 26 IU/L (ref 0–40)
Albumin: 4.7 g/dL (ref 3.8–4.9)
Alkaline Phosphatase: 106 IU/L (ref 44–121)
Bilirubin Total: 0.3 mg/dL (ref 0.0–1.2)
Bilirubin, Direct: 0.13 mg/dL (ref 0.00–0.40)
Total Protein: 7.3 g/dL (ref 6.0–8.5)

## 2022-06-29 LAB — LIPID PANEL
Chol/HDL Ratio: 1.9 ratio (ref 0.0–4.4)
Cholesterol, Total: 132 mg/dL (ref 100–199)
HDL: 69 mg/dL (ref >39)
LDL Chol Calc (NIH): 48 mg/dL (ref 0–99)
Triglycerides: 77 mg/dL (ref 0–149)
VLDL Cholesterol Cal: 15 mg/dL (ref 5–40)

## 2022-06-29 LAB — HEMOGLOBIN A1C
Est. average glucose Bld gHb Est-mCnc: 137 mg/dL
Hgb A1c MFr Bld: 6.4 % — ABNORMAL HIGH (ref 4.8–5.6)

## 2022-06-29 NOTE — Progress Notes (Signed)
Please mail to the patient

## 2022-06-30 ENCOUNTER — Other Ambulatory Visit: Payer: Self-pay | Admitting: Family Medicine

## 2022-07-06 ENCOUNTER — Telehealth: Payer: Self-pay | Admitting: *Deleted

## 2022-07-06 NOTE — Telephone Encounter (Signed)
CALLED PATIENT TO INFORM OF CT FOR 07-19-22 - ARRIVAL TIME- 10 AM @ [facility], PATIENT TO HAVE WATER ONLY- 4 HRS. PRIOR TO TEST, (THIS PATIENT IS AWARE THEY ARE A WORK-IN FOR THE CT @ [place] AND THEY ARE AWARE THAT THEY MAY HAVE TO WAIT A LITTLE BIT), PATIENT TO RECEIVE RESULTS FROM ALISON PERKINS ON 07-24-22 @ 2 PM VIA TELEPHONE, SPOKE WITH PATIENT AND SHE IS AWARE OF THESE APPTS. AND THE INSTRUCTIONS

## 2022-07-18 ENCOUNTER — Ambulatory Visit (INDEPENDENT_AMBULATORY_CARE_PROVIDER_SITE_OTHER): Payer: 59 | Admitting: Neurology

## 2022-07-18 ENCOUNTER — Encounter: Payer: Self-pay | Admitting: Neurology

## 2022-07-18 VITALS — BP 130/75 | HR 74 | Ht 68.75 in | Wt 250.2 lb

## 2022-07-18 DIAGNOSIS — E669 Obesity, unspecified: Secondary | ICD-10-CM

## 2022-07-18 DIAGNOSIS — R519 Headache, unspecified: Secondary | ICD-10-CM

## 2022-07-18 DIAGNOSIS — G4733 Obstructive sleep apnea (adult) (pediatric): Secondary | ICD-10-CM

## 2022-07-18 DIAGNOSIS — R351 Nocturia: Secondary | ICD-10-CM

## 2022-07-18 NOTE — Progress Notes (Signed)
9I

## 2022-07-18 NOTE — Progress Notes (Signed)
Subjective:    Patient ID: [name] is a 62 y.o. female.  HPI    Star Age, MD, PhD [facility] [address] [address] [place], [place] [address]  Dear Dr. Wolfgang Phoenix,  I saw your patient, [name], upon your kind request in my sleep clinic today for initial consultation of her sleep disorder, in particular, evaluation of her prior diagnosis of obstructive sleep apnea.  The patient is accompanied by a caseworker from [place] today.  As you know, [name] is a 62 year old female with an underlying complex medical history of aortic atherosclerosis, hypertension, hyperlipidemia, coronary artery disease with history of MI, diabetes, reflux disease, anxiety, depression, irritable bowel syndrome, lung nodules, history of stroke, history of pneumonia in September 2023, smoking, and obesity, who was previously diagnosed with obstructive sleep apnea and placed on PAP therapy.  She no longer has a CPAP machine, she reports that it has been years.  She is unclear as to how long it has been since she had a machine. Her Epworth sleepiness score is 13 out of 24, fatigue severity score is 45 out of 63.  She lives alone, she is separated, she does not wish to tell me if she has any children, she smokes about half a pack per day, alcohol occasionally, no daily caffeine per patient, some tea, unclear if daily.  Prior sleep study results were reviewed today. I was able to review her sleep study report from 08/24/2016, study was conducted at [facility] sleep lab and study was interpreted by Dr. Phillips Odor.  She had a baseline sleep study.  Sleep efficiency was 97.4%, sleep latency 2 minutes, REM latency delayed at 237 minutes.  Total AHI was 16/h, REM AHI was 54/h.  Average oxygen saturation during wakefulness was 92.5, average oxygen saturation during sleep was 90.3, minimum oxygen saturation was 67% during REM sleep time below or at 88% saturation was  129.6 minutes for the night.  No CPAP compliance data was available, it does not sound like she actually has a CPAP machine any longer. I reviewed your office note from 04/24/2022.  Bedtime is generally late, around 4 AM and rise time between 9 and 9:30 AM.  She has a TV on all night, she reports nocturia about 2-3 times per average night and has occasional morning headaches.  She is waiting on getting her dentures.  Of note, she takes several medications including psychotropic medications, she also takes high-dose trazodone at night for sleep, 200 mg nightly.  Her Past Medical History Is Significant For: Past Medical History:  Diagnosis Date   Anxiety    Aortic atherosclerosis ([place]) 09/19/2020   Seen on CAT scan 2021   Arthritis    Depression    Diabetes mellitus    Diabetes mellitus, type II (HCC)    Dyspnea    Dysrhythmia    GERD (gastroesophageal reflux disease)    Heart murmur    HTN (hypertension)    Hyperglycemia    IBS (irritable bowel syndrome)    Lung nodules    right, followed by PCP, PET 11/2011   Mental retardation    MI (myocardial infarction) ([place])    Migraines    NAUSEA AND VOMITING 06/09/2010   Pneumonia    Sleep apnea    cpap    Stroke [facility])     Her Past Surgical History Is Significant For: Past Surgical History:  Procedure Laterality Date   ABDOMINAL HYSTERECTOMY  BIOPSY  01/15/2020   Procedure: BIOPSY;  Surgeon: Daneil Dolin, MD;  [facility];  Service: Endoscopy;;  gastric   BIOPSY  01/25/2022   Procedure: BIOPSY;  Surgeon: Daneil Dolin, MD;  [facility];  Service: Endoscopy;;  ascending;descending;sigmoid;   BRONCHIAL BIOPSY  06/14/2021   Procedure: BRONCHIAL BIOPSIES;  Surgeon: Garner Nash, DO;  Location: [place] [facility];  Service: Pulmonary;;   BRONCHIAL BRUSHINGS  06/14/2021   Procedure: BRONCHIAL BRUSHINGS;  Surgeon: Garner Nash, DO;  Location: [place] [facility];  Service: Pulmonary;;   BRONCHIAL NEEDLE ASPIRATION  BIOPSY  06/14/2021   Procedure: BRONCHIAL NEEDLE ASPIRATION BIOPSIES;  Surgeon: Garner Nash, DO;  Location: [place];  Service: Pulmonary;;   COLONOSCOPY  11/2007   hyperplastic polyps, prior hx of adenomas    COLONOSCOPY  05/2010   incomplete due to poor prep, hyperplastic rectal polyp   COLONOSCOPY  05/05/2002   Dimunitive polyps in the rectum and left colon, cold    biopsied/removed.  Scattered few left-sided diverticula.  Regular colonic   mucosa appeared normal   COLONOSCOPY N/A 05/28/2013   Rourk: mulitple tubular adenomas removed. next tcs 05/2016   COLONOSCOPY WITH PROPOFOL N/A 09/07/2016   Dr. Gala Romney: For hyperplastic polyps removed. Next colonoscopy March 2023 given history of adenomatous colon polyps in the past.   COLONOSCOPY WITH PROPOFOL N/A 01/25/2022   Redundant/elongated colon, melanosis coli, left-sided diverticula, multiple colon polyps, colonic lipoma. Negative active colitis, adenomas. 3 year surveillance.   ESOPHAGOGASTRODUODENOSCOPY  08/2007   moderate sized hiatal hernia   ESOPHAGOGASTRODUODENOSCOPY  05/2010   noncritical schatzki ring s/p 20F   ESOPHAGOGASTRODUODENOSCOPY (EGD) WITH ESOPHAGEAL DILATION N/A 02/06/2013   DQQ:IWLNLG esophagus-s/p dilation up to a 63 [place] size with [facility] dilators.  Hiatal hernia   ESOPHAGOGASTRODUODENOSCOPY (EGD) WITH PROPOFOL N/A 09/07/2016   Dr. Gala Romney: Normal, status post empiric dilation of the esophagus for history of dysphagia   ESOPHAGOGASTRODUODENOSCOPY (EGD) WITH PROPOFOL N/A 05/02/2019   Normal esophagus s/p dilation, normal stomach, normal duodenum   ESOPHAGOGASTRODUODENOSCOPY (EGD) WITH PROPOFOL N/A 01/15/2020   normal esophagus s/p dilation, gastric nodule s/p biopsy. This showed reactive gastropathy with H.pylori.    EXTERNAL EAR SURGERY     bilateral   FIDUCIAL MARKER PLACEMENT  06/14/2021   Procedure: FIDUCIAL MARKER PLACEMENT;  Surgeon: Garner Nash, DO;  Location: [place] [facility];  Service: Pulmonary;;    FOOT SURGERY     GLAUCOMA SURGERY     KNEE ARTHROPLASTY Right 11/04/2020   Procedure: COMPUTER ASSISTED TOTAL KNEE ARTHROPLASTY;  Surgeon: Rod Can, MD;  [facility];  Service: Orthopedics;  Laterality: Right;   MALONEY DILATION N/A 09/07/2016   Procedure: Venia Minks DILATION;  Surgeon: Daneil Dolin, MD;  [facility];  Service: Endoscopy;  Laterality: N/A;   MALONEY DILATION N/A 05/02/2019   Procedure: Venia Minks DILATION;  Surgeon: Daneil Dolin, MD;  [facility];  Service: Endoscopy;  Laterality: N/A;   MALONEY DILATION N/A 01/15/2020   Procedure: Venia Minks DILATION;  Surgeon: Daneil Dolin, MD;  [facility];  Service: Endoscopy;  Laterality: N/A;   POLYPECTOMY  09/07/2016   Procedure: POLYPECTOMY;  Surgeon: Daneil Dolin, MD;  [facility];  Service: Endoscopy;;  sigmoid colon x4   POLYPECTOMY  01/25/2022   Procedure: POLYPECTOMY;  Surgeon: Daneil Dolin, MD;  [facility];  Service: Endoscopy;;  hepatic flexure and descending/sigmoid colon   small bowel capsule  10/2007  normal   VIDEO BRONCHOSCOPY WITH RADIAL ENDOBRONCHIAL ULTRASOUND  06/14/2021   Procedure: VIDEO BRONCHOSCOPY WITH RADIAL ENDOBRONCHIAL ULTRASOUND;  Surgeon: Garner Nash, DO;  Location: [facility];  Service: Pulmonary;;    Her Family History Is Significant For: Family History  Problem Relation Age of Onset   Stroke Mother    Heart attack Father    Sleep apnea Father    Depression Sister    Schizophrenia Sister    Schizophrenia Other    Drug abuse Other    Alcohol abuse Other    Colon cancer Other        aunt   Obesity Other    COPD Other    Anxiety disorder Other    GER disease Other    Diabetes type II Other    Anxiety disorder Other    Depression Other    Liver disease Neg Hx    Inflammatory bowel disease Neg Hx     Her Social History Is Significant For: Social History   Socioeconomic History   Marital status: Divorced     Spouse name: Not on file   Number of children: Not on file   Years of education: Not on file   Highest education level: High school graduate  Occupational History   Occupation: disabled    Employer: UNEMPLOYED  Tobacco Use   Smoking status: Every Day    Packs/day: 0.50    Years: 40.00    Total pack years: 20.00    Types: Cigarettes    Passive exposure: Current   Smokeless tobacco: Never  Vaping Use   Vaping Use: Never used  Substance and Sexual Activity   Alcohol use: No    Comment: occ   Drug use: No   Sexual activity: Not Currently    Partners: Male    Birth control/protection: Surgical    Comment: hyst  Other Topics Concern   Not on file  Social History Narrative   Not on file   Social Determinants of Health   Financial Resource Strain: Low Risk  (05/02/2022)   Overall Financial Resource Strain (CARDIA)    Difficulty of Paying Living Expenses: Not hard at all  Food Insecurity: No Food Insecurity (05/02/2022)   Hunger Vital Sign    Worried About Running Out of Food in the Last Year: Never true    Ran Out of Food in the Last Year: Never true  Transportation Needs: No Transportation Needs (05/10/2022)   PRAPARE - Hydrologist (Medical): No    Lack of Transportation (Non-Medical): No  Physical Activity: Insufficiently Active (05/02/2022)   Exercise Vital Sign    Days of Exercise per Week: 3 days    Minutes of Exercise per Session: 30 min  Stress: No Stress Concern Present (05/10/2022)   [place]    Feeling of Stress : Only a little  Social Connections: Socially Isolated (05/02/2022)   Social Connection and Isolation Panel [NHANES]    Frequency of Communication with Friends and Family: Twice a week    Frequency of Social Gatherings with Friends and Family: Once a week    Attends Religious Services: Never    Marine scientist or Organizations: No    Attends Arts development officer: Never    Marital Status: Divorced    Her Allergies Are:  Allergies  Allergen Reactions   Thorazine [Chlorpromazine Hcl] Anaphylaxis   Acetaminophen Other (See Comments)    Makes  pt dizzy   Aspirin Other (See Comments)    seizure   Aspirin-Acetaminophen-Caffeine Other (See Comments)    seizure   Nsaids Nausea And Vomiting   Other     Acidic foods   Penicillins Nausea And Vomiting    Has patient had a PCN reaction causing immediate rash, facial/tongue/throat swelling, SOB or lightheadedness with hypotension:Yes Has patient had a PCN reaction causing severe rash involving mucus membranes or skin necrosis:Yes Has patient had a PCN reaction that required hospitalization:Yes Has patient had a PCN reaction occurring within the last 10 years:No If all of the above answers are "NO", then may proceed with Cephalosporin use.    Tomato Rash  :   Her Current Medications Are:  Outpatient Encounter Medications as of 07/18/2022  Medication Sig   albuterol (PROVENTIL) (2.5 MG/3ML) 0.083% nebulizer solution Take 3 mLs (2.5 mg total) by nebulization every 6 (six) hours as needed for wheezing or shortness of breath.   atorvastatin (LIPITOR) 10 MG tablet Take 1 tablet (10 mg total) by mouth daily.   benztropine (COGENTIN) 1 MG tablet Take 1 tablet (1 mg total) by mouth daily.   cycloSPORINE (RESTASIS) 0.05 % ophthalmic emulsion Place 1 drop into both eyes 2 (two) times daily.   dicyclomine (BENTYL) 10 MG capsule TAKE 1 CAPSULE BY MOUTH UP TO 3 TIMES DAILY BEFORE MEALS AS NEEDED FOR ABDOMINAL CRAMPS/LOOSE STOOLS.   famotidine (PEPCID) 20 MG tablet Take 1 tablet (20 mg total) by mouth 2 (two) times daily.   GOODSENSE HEMORRHOIDAL 0.25-88.44 % suppository UNWRAP AND PLACE 1 SUPPOSITORY RECTALLY 2 TIMES DAILY AS NEEDED FOR HEMORRHOIDS/ANAL ITCH   hydrocortisone (ANUSOL-HC) 2.5 % rectal cream PLACE 1 APPLICATION RECTALLY 2 TIMES DAILY.   hydrocortisone (ANUSOL-HC) 25 MG  suppository Place 25 mg rectally 2 (two) times daily.   lamoTRIgine (LAMICTAL) 100 MG tablet Take 1 tablet (100 mg total) by mouth 2 (two) times daily.   loperamide (IMODIUM A-D) 2 MG tablet Take 1 tablet (2 mg total) by mouth daily. (Patient taking differently: Take 2 mg by mouth daily.)   loperamide (IMODIUM) 2 MG capsule Take by mouth daily.   paliperidone (INVEGA) 9 MG 24 hr tablet Take 1 tablet (9 mg total) by mouth daily.   potassium chloride (KLOR-CON) 10 MEQ tablet TAKE 2 TABLETS (20MEQ) BY MOUTH TWICE DAILY WITH FOOD   propranolol (INDERAL) 10 MG tablet TAKE 1 TABLET BY MOUTH THREE TIMES A DAY.   senna (SENOKOT) 8.6 MG TABS tablet Take 2 tablets by mouth daily.   sertraline (ZOLOFT) 100 MG tablet Take 100 mg by mouth daily.   torsemide (DEMADEX) 20 MG tablet TAKE (1) TABLET BY MOUTH EACH MORNING. (Patient taking differently: Take 20 mg by mouth daily.)   traZODone (DESYREL) 100 MG tablet TAKE 2 TABLETS(200MG ) BY MOUTH AT BEDTIME.   TRELEGY ELLIPTA 100-62.5-25 MCG/ACT AEPB INHALE (1) PUFF INTO THE LUNGS ONCE DAILY.   RABEprazole (ACIPHEX) 20 MG tablet TAKE 1 TABLET BY MOUTH TWICE A DAY. (Patient taking differently: Take 20 mg by mouth in the morning and at bedtime.)   No facility-administered encounter medications on file as of 07/18/2022.  :   Review of Systems:  Out of a complete 14 point review of systems, all are reviewed and negative with the exception of these symptoms as listed below:  Review of Systems  Neurological:        Pt here for sleep consult Pt snores,fatigue,hypertension,headaches Pt denies sleep study, CPAP machine    ESS:13 FSS:45  Objective:  Neurological Exam  Physical Exam Physical Examination:   Vitals:   07/18/22 1020  BP: 130/75  Pulse: 74    General Examination: The patient is a very pleasant 62 y.o. female in no acute distress. She appears well-developed and well-nourished and well groomed.   HEENT: Normocephalic, atraumatic, pupils are  equal, round and reactive to light, extraocular tracking is good without limitation to gaze excursion or nystagmus noted. Hearing is grossly intact. Face is symmetric with normal facial animation. Speech is clear with no dysarthria noted. There is no hypophonia. There is no lip, neck/head, jaw or voice tremor. Neck is supple with full range of passive and active motion. There are no carotid bruits on auscultation. Oropharynx exam reveals: moderate mouth dryness, edentulous with moderate airway crowding, wider tongue and thicker soft palate noted, Mallampati class III, neck circumference 16 three-quarter inches.  Tongue protrudes centrally and palate elevates symmetrically.  She had a tonsillectomy.    Chest: Clear to auscultation without wheezing, rhonchi or crackles noted.  Heart: S1+S2+0, regular and normal without murmurs, rubs or gallops noted.   Abdomen: Soft, non-tender and non-distended.  Extremities: There is no pitting edema in the distal lower extremities bilaterally.   Skin: Warm and dry without trophic changes noted.   Musculoskeletal: exam reveals no obvious joint deformities.   Neurologically:  Mental status: The patient is awake, alert and oriented, unable to provide very many details about her history.  Some details as supplemented by her caseworker. Cranial nerves II - XII are as described above under HEENT exam.  Motor exam: Normal bulk, strength and tone is noted. There is no obvious action or resting tremor.  Mildly restless appearing. Fine motor skills and coordination: grossly intact.  Cerebellar testing: No dysmetria or intention tremor. There is no truncal or gait ataxia.  Sensory exam: intact to light touch in the upper and lower extremities.  Gait, station and balance: Walks with a rolling walker.    Assessment and Plan:  In summary, [name] is a very pleasant 62 y.o.-year old female with an underlying complex medical history of aortic atherosclerosis,  hypertension, hyperlipidemia, coronary artery disease with history of MI, diabetes, reflux disease, anxiety, depression, irritable bowel syndrome, lung nodules, history of stroke, history of pneumonia in September 2023, smoking, and obesity, who who presents for evaluation of her obstructive sleep apnea which was deemed in the moderate range in 2018.  She has not been on CPAP therapy lately, she was on a machine in the past as I understand.  She is willing to get reevaluated and consider CPAP therapy.   I had a long chat with the patient about my findings and the diagnosis of sleep apnea, particularly OSA, its prognosis and treatment options. We talked about medical/conservative treatments, surgical interventions and non-pharmacological approaches for symptom control. I explained, in particular, the risks and ramifications of untreated moderate to severe OSA, especially with respect to developing cardiovascular disease down the road, including congestive heart failure (CHF), difficult to treat hypertension, cardiac arrhythmias (particularly A-fib), neurovascular complications including TIA, stroke and dementia. Even type 2 diabetes has, in part, been linked to untreated OSA. Symptoms of untreated OSA may include (but may not be limited to) daytime sleepiness, nocturia (i.e. frequent nighttime urination), memory problems, mood irritability and suboptimally controlled or worsening mood disorder such as depression and/or anxiety, lack of energy, lack of motivation, physical discomfort, as well as recurrent headaches, especially morning or nocturnal headaches. We talked about the importance of  maintaining a healthy lifestyle and striving for healthy weight.  The importance of complete smoking cessation was also addressed.  In addition, we talked about the importance of striving for and maintaining good sleep hygiene. I recommended a sleep study at this time. I outlined the differences between a laboratory attended  sleep study which is considered more comprehensive and accurate over the option of a home sleep test (HST); the latter may lead to underestimation of sleep disordered breathing in some instances and does not help with diagnosing upper airway resistance syndrome and is not accurate enough to diagnose primary central sleep apnea typically. I outlined possible surgical and non-surgical treatment options of OSA, including the use of a positive airway pressure (PAP) device (i.e. CPAP, AutoPAP/APAP or BiPAP in certain circumstances), a custom-made dental device (aka oral appliance, which would require a referral to a specialist dentist or orthodontist typically, and is generally speaking not considered for patients with full dentures or edentulous state), upper airway surgical options, such as traditional UPPP (which is not considered a first-line treatment) or the Inspire device (hypoglossal nerve stimulator, which would involve a referral for consultation with an ENT surgeon, after careful selection, following inclusion criteria - also not first-line treatment). I explained the PAP treatment option to the patient in detail, as this is generally considered first-line treatment.  The patient indicated that she would be willing to try PAP therapy, if the need arises. I explained the importance of being compliant with PAP treatment, not only for insurance purposes but primarily to improve patient's symptoms symptoms, and for the patient's long term health benefit, including to reduce Her cardiovascular risks longer-term.    We will pick up our discussion about the next steps and treatment options after testing.  We will keep her posted as to the test results by phone call and/or MyChart messaging where possible.  We will plan to follow-up in sleep clinic accordingly as well.  I answered all their questions today and the patient and her DSS caseworker were in agreement.   I encouraged them to call with any interim  questions, concerns, problems or updates or email [place] through [place].  Generally speaking, sleep test authorizations may take up to 2 weeks, sometimes less, sometimes longer, the patient is encouraged to get in touch with [place] if they do not hear back from the sleep lab staff directly within the next 2 weeks.  Thank you very much for allowing me to participate in the care of this nice patient. If I can be of any further assistance to you please do not hesitate to call me at [phone].  Sincerely,   Star Age, MD, PhD

## 2022-07-18 NOTE — Patient Instructions (Signed)

## 2022-07-19 ENCOUNTER — Ambulatory Visit (HOSPITAL_COMMUNITY): Admission: RE | Admit: 2022-07-19 | Payer: 59 | Source: Ambulatory Visit

## 2022-07-19 ENCOUNTER — Ambulatory Visit: Payer: Medicare Other | Admitting: Gastroenterology

## 2022-07-20 ENCOUNTER — Ambulatory Visit: Payer: Self-pay

## 2022-07-20 NOTE — Patient Instructions (Signed)
Visit Information  Thank you for taking time to visit with me today. Please don't hesitate to contact me if I can be of assistance to you.   Following are the goals we discussed today:   Goals Addressed               This Visit's Progress     improve worsenting respiratory symptoms ([facility]) (pt-stated)        Care Coordination Interventions: Evaluation of current treatment plan related to obstructive sleep apnea, need for new CPAP and patient's adherence to plan as established by provider Discussed plans with patient for ongoing care management follow up and provided patient with direct contact information for care management team Confirmed she was diagnosed with pneumonia  Confirmed she has not received a nebulizer not CPAP ordered by her  pulmonologist Confirmed she continues to smoke Outreach to Dr Valeta Harms office to inquire about a nebulizer for the patient. Spoke with Estill Bamberg who will send a message to Dr Valeta Harms after assessing for the need of a nebulizer Assessed CPAP use, sleep study tests Discussed the availability of virtual &  telephone medical appointments  07/20/22 Spoke with patient who denies any problems with her breathing presently.  She has seen the doctor for her sleep apnea and referral being done for sleep test per patient.  She is using her nebulizer twice a day.         maintain control of blood pressure at home [facility]) (pt-stated)        Care Coordination Interventions: Evaluation of current treatment plan related to hypertension self management and patient's adherence to plan as established by provider Discussed plans with patient for ongoing care management follow up and provided patient with direct contact information for care management team  Assessed if she has purchased her blood pressure cuff Encouragement provided   Spoke with April at [facility] to inquire if patient had any options to have a blood pressure cuff and a nebulizer delivered to her.  April  confirmed no nebulizer orders and [facility] does not accept the U card for a blood pressure cuff. Will update patient  07/20/22 Spoke with patient she states her blood pressure has been good and that her aide checks her pressure when she is in the home.  Patient unable to give numbers.         Our next appointment is by telephone on 08/17/22 at 130pm  Please call the care guide team at [phone] if you need to cancel or reschedule your appointment.   If you are experiencing a Mental Health or [place] or need someone to talk to, please call the Suicide and Crisis Lifeline: 988   Patient verbalizes understanding of instructions and care plan provided today and agrees to view in [place]. Active MyChart status and patient understanding of how to access instructions and care plan via MyChart confirmed with patient.     Telephone follow up appointment with care management team member scheduled for: January  Johnae Friley J Keondria Siever, RN, MSN [place] [facility] Care Management Coordinator Direct Line [phone]

## 2022-07-20 NOTE — Patient Outreach (Signed)
  Care Coordination   Follow Up Visit Note   07/20/2022 Name: [name] MRN: [id number] DOB: 07-24-1960  [name] is a 62 y.o. year old female who sees Luking, Denise Snare, MD for primary care. I spoke with  [name] by phone today.  What matters to the patients health and wellness today?  Concerned about Thyroid and cancer as she has a nodule in her lung. Patient states she will follow up with PCP about her thyroid.  CT scan scheduled for later this  month and Oncology appointment in February. Emotional support provided to patient.     Goals Addressed               This Visit's Progress     improve worsenting respiratory symptoms (THN) (pt-stated)        Care Coordination Interventions: Evaluation of current treatment plan related to obstructive sleep apnea, need for new CPAP and patient's adherence to plan as established by provider Discussed plans with patient for ongoing care management follow up and provided patient with direct contact information for care management team Confirmed she was diagnosed with pneumonia  Confirmed she has not received a nebulizer not CPAP ordered by her  pulmonologist Confirmed she continues to smoke Outreach to Dr Valeta Harms office to inquire about a nebulizer for the patient. Spoke with Estill Bamberg who will send a message to Dr Valeta Harms after assessing for the need of a nebulizer Assessed CPAP use, sleep study tests Discussed the availability of virtual &  telephone medical appointments  07/20/22 Spoke with patient who denies any problems with her breathing presently.  She has seen the doctor for her sleep apnea and referral being done for sleep test per patient.  She is using her nebulizer twice a day.         maintain control of blood pressure at home [facility]) (pt-stated)        Care Coordination Interventions: Evaluation of current treatment plan related to hypertension self management and patient's adherence to plan as established by provider Discussed  plans with patient for ongoing care management follow up and provided patient with direct contact information for care management team  Assessed if she has purchased her blood pressure cuff Encouragement provided   Spoke with April at [place] to inquire if patient had any options to have a blood pressure cuff and a nebulizer delivered to her.  April confirmed no nebulizer orders and [facility] does not accept the U card for a blood pressure cuff. Will update patient  07/20/22 Spoke with patient she states her blood pressure has been good and that her aide checks her pressure when she is in the home.  Patient unable to give numbers.         SDOH assessments and interventions completed:  Yes     Care Coordination Interventions:  Yes, provided   Follow up plan: Follow up call scheduled for February    Encounter Outcome:  Pt. Visit Completed   Denise Baseman, RN, MSN [facility] Care Management Coordinator Direct Line [phone]

## 2022-07-24 ENCOUNTER — Ambulatory Visit: Payer: 59 | Admitting: Radiation Oncology

## 2022-07-25 ENCOUNTER — Telehealth: Payer: Self-pay | Admitting: Student

## 2022-07-25 DIAGNOSIS — K219 Gastro-esophageal reflux disease without esophagitis: Secondary | ICD-10-CM

## 2022-07-25 MED ORDER — FAMOTIDINE 20 MG PO TABS: 20.0000 mg | ORAL_TABLET | Freq: Two times a day (BID) | ORAL | 5 refills | Status: DC

## 2022-07-25 NOTE — Telephone Encounter (Signed)
Called and spoke to [name] and she verified that the patient is needing refills on pepcid. Refills sent. Nothing further needed

## 2022-07-28 NOTE — Progress Notes (Signed)
Please let patient know pulmonary function testing that was ordered back in December 2022 that was just done in Nov 2023 showed severe obstructive lung disease consistent with COPD.  She is currently on Trelegy. She has not been seen in over 6 months. Needs follow-up with Dr. Tonia Brooms- not urgent, first available

## 2022-07-31 ENCOUNTER — Encounter: Payer: Self-pay | Admitting: Gastroenterology

## 2022-07-31 ENCOUNTER — Ambulatory Visit (INDEPENDENT_AMBULATORY_CARE_PROVIDER_SITE_OTHER): Payer: 59 | Admitting: Gastroenterology

## 2022-07-31 VITALS — BP 128/85 | HR 86 | Temp 98.1°F | Ht 70.0 in | Wt 250.2 lb

## 2022-07-31 DIAGNOSIS — K582 Mixed irritable bowel syndrome: Secondary | ICD-10-CM | POA: Diagnosis not present

## 2022-07-31 MED ORDER — DICYCLOMINE HCL 10 MG PO CAPS: ORAL_CAPSULE | ORAL | 10 refills | Status: DC

## 2022-07-31 MED ORDER — SENNA 8.6 MG PO TABS: 1.0000 | ORAL_TABLET | Freq: Every day | ORAL | 10 refills | Status: DC

## 2022-07-31 NOTE — Patient Instructions (Addendum)
Reduce senna to one tablet daily. We will rewrite your dicyclomine to 10mg  twice daily. Let me know if your bowel movements are still too frequent or if you become constipated.  Return to the office in 8 weeks.

## 2022-07-31 NOTE — Progress Notes (Signed)
GI Office Note    Referring Provider: Babs Sciara, MD Primary Care Physician:  Babs Sciara, MD  Primary Gastroenterologist: Roetta Sessions, MD   Chief Complaint   Chief Complaint  Patient presents with   Follow-up    States that she is having too many bm's a day.     History of Present Illness   [name] is a 62 y.o. female presenting today for four week follow up of constipation. She has h/o GERD, IBS, hemorrhoids.  She has a history of H. pylori on biopsies and needs H. pylori eradication testing.  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  Surveillance colonoscopy 01/2022, with multiple colonic polyps, colonic lipoma, negative stool studies and negative microscopic colitis.  Needs surveillance in 2026. Stool studies negative in 01/2022.   Presents with her social worker, Advice worker. Patient no longer having constipation. She now states her stools are too frequent. She passes stool every time she urinates. Stool is soft. No brbpr or melena. She is taking dicyclomine only twice per day. Her pack states it is prn up to tid. Social worker notes it is confusing for patient. Would be easier if it was in her daily medication packets. She is also taking two sennas each day. No longer on miralax. Reflux on Aciphex BID. Sometimes with breakthrough symptoms. No n/v. No dysphagia. Cut back on sausage in the mornings. Still eating some fried foods prepared by her CNAs.   Medications   Current Outpatient Medications  Medication Sig Dispense Refill   albuterol (PROVENTIL) (2.5 MG/3ML) 0.083% nebulizer solution Take 3 mLs (2.5 mg total) by nebulization every 6 (six) hours as needed for wheezing or shortness of breath. 75 mL 12   atorvastatin (LIPITOR) 10 MG tablet Take 1 tablet (10 mg total) by mouth daily. 30 tablet 6   benztropine (COGENTIN) 1 MG tablet Take 1 tablet (1 mg total) by mouth daily. 30 tablet 5   cycloSPORINE (RESTASIS) 0.05  % ophthalmic emulsion Place 1 drop into both eyes 2 (two) times daily.     dicyclomine (BENTYL) 10 MG capsule TAKE 1 CAPSULE BY MOUTH UP TO 3 TIMES DAILY BEFORE MEALS AS NEEDED FOR ABDOMINAL CRAMPS/LOOSE STOOLS. 90 capsule 10   famotidine (PEPCID) 20 MG tablet Take 1 tablet (20 mg total) by mouth 2 (two) times daily. 60 tablet 5   hydrocortisone (ANUSOL-HC) 25 MG suppository Place 25 mg rectally 2 (two) times daily.     lamoTRIgine (LAMICTAL) 100 MG tablet Take 1 tablet (100 mg total) by mouth 2 (two) times daily. 60 tablet 5   paliperidone (INVEGA) 9 MG 24 hr tablet Take 1 tablet (9 mg total) by mouth daily. 30 tablet 5   potassium chloride (KLOR-CON) 10 MEQ tablet TAKE 2 TABLETS ( ) BY MOUTH TWICE DAILY WITH FOOD 120 tablet 5   propranolol (INDERAL) 10 MG tablet TAKE 1 TABLET BY MOUTH THREE TIMES A DAY. 90 tablet 5   RABEprazole (ACIPHEX) 20 MG tablet TAKE 1 TABLET BY MOUTH TWICE A DAY. (Patient taking differently: Take 20 mg by mouth in the morning and at bedtime.) 180 tablet 3   senna (SENOKOT) 8.6 MG TABS tablet Take 2 tablets by mouth daily.     sertraline (ZOLOFT) 100 MG tablet Take 100 mg by mouth daily.     torsemide (DEMADEX) 20 MG tablet TAKE (1) TABLET BY MOUTH EACH MORNING. 30 tablet 5   traZODone (DESYREL) 100 MG  tablet TAKE 2 TABLETS(200MG ) BY MOUTH AT BEDTIME. 60 tablet 5   TRELEGY ELLIPTA 100-62.5-25 MCG/ACT AEPB INHALE (1) PUFF INTO THE LUNGS ONCE DAILY. 60 each 5   No current facility-administered medications for this visit.    Allergies   Allergies as of 07/31/2022 - Review Complete 07/31/2022  Allergen Reaction Noted   Thorazine [chlorpromazine hcl] Anaphylaxis 10/20/2010   Acetaminophen Other (See Comments)    Aspirin Other (See Comments)    Aspirin-acetaminophen-caffeine Other (See Comments)    Nsaids Nausea And Vomiting    Other  03/22/2019   Penicillins Nausea And Vomiting    Tomato Rash 01/17/2012    Review of Systems   General: Negative for anorexia,  weight loss, fever, chills, fatigue, weakness. ENT: Negative for hoarseness, difficulty swallowing , nasal congestion. CV: Negative for chest pain, angina, palpitations, dyspnea on exertion, peripheral edema.  Respiratory: Negative for dyspnea at rest, dyspnea on exertion, cough, sputum, wheezing.  GI: See history of present illness. GU:  Negative for dysuria, hematuria, urinary incontinence, urinary frequency, nocturnal urination.  Endo: Negative for unusual weight change.     Physical Exam   BP 128/85 (BP Location: Right Arm, Patient Position: Sitting, Cuff Size: Large)   Pulse 86   Temp 98.1 F (36.7 C) (Oral)   Ht 5\' 10"  (1.778 m)   Wt 250 lb 3.2 oz (113.5 kg)   SpO2 92%   BMI 35.90 kg/m    General: Well-nourished, well-developed in no acute distress.  Eyes: No icterus. Mouth: Oropharyngeal mucosa moist and pink   Abdomen: Bowel sounds are normal, nontender, nondistended, no hepatosplenomegaly or masses,  no abdominal bruits or hernia , no rebound or guarding.  Rectal: not performed Extremities: No lower extremity edema. No clubbing or deformities. Neuro: Alert and oriented x 4   Skin: Warm and dry, no jaundice.   Psych: Alert and cooperative, normal mood and affect.  Labs   Lab Results  Component Value Date   CREATININE 0.89 06/28/2022   BUN 10 06/28/2022   NA 140 06/28/2022   K 4.2 06/28/2022   CL 97 06/28/2022   CO2 29 06/28/2022   Lab Results  Component Value Date   ALT 16 06/28/2022   AST 26 06/28/2022   ALKPHOS 106 06/28/2022   BILITOT 0.3 06/28/2022    Imaging Studies   No results found.  Assessment   IBS: more difficult with increased stool frequency but stools are soft/formed. Will adjust her daily bowel regimen. Will also simplify her dicyclomine order from prn to daily regimen (BID) as this is her preferred dose. Helps with cramps. Increased dose causes constipation.    PLAN   Continue Aciphex 20mg  BID. Continue Bentyl 10mg  BID,  scheduled. Reduce Senna to one daily. Return ov in 8 weeks. Call sooner if constipation.  At some point, we will need to confirm h.pylori eradication.   [name], [place], [facility]

## 2022-08-01 ENCOUNTER — Telehealth: Payer: Self-pay

## 2022-08-01 NOTE — Telephone Encounter (Signed)
Agree. And I wrote the senna for bedtime.

## 2022-08-01 NOTE — Telephone Encounter (Signed)
[facility] called to clarify that the pt is supposed to be taking loperamide at night and Senna in the morning. Informed [facility] that pt stated that she is not taking the loperamide. Verified instructions from [place]. Will send d/c order of loperamide to [facility].

## 2022-08-04 ENCOUNTER — Other Ambulatory Visit: Payer: Self-pay | Admitting: Family Medicine

## 2022-08-09 ENCOUNTER — Encounter (HOSPITAL_COMMUNITY): Payer: Self-pay | Admitting: Radiology

## 2022-08-09 ENCOUNTER — Ambulatory Visit (HOSPITAL_COMMUNITY)
Admission: RE | Admit: 2022-08-09 | Discharge: 2022-08-09 | Disposition: A | Discharge status: Home / Self Care | Payer: 59 | Source: Ambulatory Visit | Attending: Radiation Oncology | Admitting: Radiation Oncology

## 2022-08-09 DIAGNOSIS — J439 Emphysema, unspecified: Secondary | ICD-10-CM | POA: Diagnosis not present

## 2022-08-09 DIAGNOSIS — D3A090 Benign carcinoid tumor of the bronchus and lung: Secondary | ICD-10-CM | POA: Diagnosis not present

## 2022-08-09 DIAGNOSIS — C3490 Malignant neoplasm of unspecified part of unspecified bronchus or lung: Secondary | ICD-10-CM | POA: Diagnosis not present

## 2022-08-09 LAB — POCT I-STAT CREATININE: Creatinine, Ser: 0.9 mg/dL (ref 0.44–1.00)

## 2022-08-09 MED ORDER — IOHEXOL 300 MG/ML  SOLN
75.0000 mL | Freq: Once | INTRAMUSCULAR | Status: AC | PRN
  Administered 2022-08-09: 75 mL via INTRAVENOUS

## 2022-08-10 ENCOUNTER — Ambulatory Visit
Admission: RE | Admit: 2022-08-10 | Discharge: 2022-08-10 | Disposition: A | Discharge status: Home / Self Care | Payer: 59 | Source: Ambulatory Visit | Attending: Radiation Oncology | Admitting: Radiation Oncology

## 2022-08-10 DIAGNOSIS — C342 Malignant neoplasm of middle lobe, bronchus or lung: Secondary | ICD-10-CM

## 2022-08-10 NOTE — Addendum Note (Signed)
Encounter addended by: Hayden Pedro, PA-C on: 08/10/2022 1:02 PM  Actions taken: Order list changed, Diagnosis association updated

## 2022-08-10 NOTE — Progress Notes (Signed)
Radiation Oncology         [phone] ________________________________   Outpatient Follow Up - Conducted via telephone at patient request.  I spoke with the patient's guardian to conduct this consult visit via telephone. The patient's guardian was notified in advance and was offered an in person or telemedicine meeting to allow for face to face communication but instead preferred to proceed with a telephone visit.  Name: [name]        MRN: [id number]  Date of Service: 08/10/2022 DOB: February 01, 1961  JM:EQASTM, Elayne Snare, MD  Garner Nash, DO     REFERRING PHYSICIAN: Garner Nash, DO   DIAGNOSIS: The encounter diagnosis was Primary malignant neoplasm of right middle lobe of lung ([place]).   HISTORY OF PRESENT ILLNESS: [name] is a 62 y.o. female with a carcinoid tumor in the right lung.  The patient has been followed for many years with what was felt to be a benign nodule in the right lung.  She had a CT chest without contrast on 03/04/2021 that showed a Nodule in the right middle lobe measuring up to 2 cm in close proximity to the pulmonary artery branch.  She underwent a PET scan on 04/01/2021 that showed hypermetabolism with an SUV of 7.1 and this nodule, to rule out AVM in this location, she did also have a CT angiography scan of the chest on 05/26/2021 and there was no post contrast enhancement to suggest vascular anomaly.  She did undergo bronchoscopy on 06/14/2021 cytology from that procedure with Dr.Icard showed neoplastic cells in the fine-needle aspirate consistent with neuroendocrine tumor similar findings were seen in the brushing.  She was offered surgical resection but is interested in less invasive approach.    Rather, she went on to receive stereotactic body radiotherapy (SBRT) which she completed in February 2023.  She has been followed in surveillance.  Her most recent CT scan of the chest was performed today, and showed stable posttreatment changes on the right middle  lobe with volume loss and consolidation along the periphery of her fiducial markers.  No recurrent mass or concerns for nodules are noted. There is evidence radiographically of atherosclerotic disease and emphysematous changes remain.  Her guardian [name] is contacted to review these results.    PREVIOUS RADIATION THERAPY:   08/01/21-08/16/21: The tumor in the RML was treated with a course of stereotactic body radiation treatment. The patient received 50 Gy In 5 fractions at 10 Gy per fraction.   PAST MEDICAL HISTORY:  Past Medical History:  Diagnosis Date   Anxiety    Aortic atherosclerosis ([place]) 09/19/2020   Seen on CAT scan 2021   Arthritis    Depression    Diabetes mellitus    Diabetes mellitus, type II ([place])    Dyspnea    Dysrhythmia    GERD (gastroesophageal reflux disease)    Heart murmur    HTN (hypertension)    Hyperglycemia    IBS (irritable bowel syndrome)    Lung nodules    right, followed by PCP, PET 11/2011   Mental retardation    MI (myocardial infarction) ([place])    Migraines    NAUSEA AND VOMITING 06/09/2010   Pneumonia    Sleep apnea    cpap    Stroke [facility])        PAST SURGICAL HISTORY: Past Surgical History:  Procedure Laterality Date   ABDOMINAL HYSTERECTOMY     BIOPSY  01/15/2020   Procedure: BIOPSY;  Surgeon: Daneil Dolin, MD;  [facility];  Service: Endoscopy;;  gastric   BIOPSY  01/25/2022   Procedure: BIOPSY;  Surgeon: Daneil Dolin, MD;  [facility];  Service: Endoscopy;;  ascending;descending;sigmoid;   BRONCHIAL BIOPSY  06/14/2021   Procedure: BRONCHIAL BIOPSIES;  Surgeon: Garner Nash, DO;  Location: [facility];  Service: Pulmonary;;   BRONCHIAL BRUSHINGS  06/14/2021   Procedure: BRONCHIAL BRUSHINGS;  Surgeon: Garner Nash, DO;  Location: [place] [facility];  Service: Pulmonary;;   BRONCHIAL NEEDLE ASPIRATION BIOPSY  06/14/2021   Procedure: BRONCHIAL NEEDLE ASPIRATION BIOPSIES;  Surgeon: Garner Nash, DO;  Location: [place];  Service: Pulmonary;;   COLONOSCOPY  11/2007   hyperplastic polyps, prior hx of adenomas    COLONOSCOPY  05/2010   incomplete due to poor prep, hyperplastic rectal polyp   COLONOSCOPY  05/05/2002   Dimunitive polyps in the rectum and left colon, cold    biopsied/removed.  Scattered few left-sided diverticula.  Regular colonic   mucosa appeared normal   COLONOSCOPY N/A 05/28/2013   Rourk: mulitple tubular adenomas removed. next tcs 05/2016   COLONOSCOPY WITH PROPOFOL N/A 09/07/2016   Dr. Gala Romney: For hyperplastic polyps removed. Next colonoscopy March 2023 given history of adenomatous colon polyps in the past.   COLONOSCOPY WITH PROPOFOL N/A 01/25/2022   Redundant/elongated colon, melanosis coli, left-sided diverticula, multiple colon polyps, colonic lipoma. Negative active colitis, adenomas. 3 year surveillance.   ESOPHAGOGASTRODUODENOSCOPY  08/2007   moderate sized hiatal hernia   ESOPHAGOGASTRODUODENOSCOPY  05/2010   noncritical schatzki ring s/p 84F   ESOPHAGOGASTRODUODENOSCOPY (EGD) WITH ESOPHAGEAL DILATION N/A 02/06/2013   VQM:GQQPYP esophagus-s/p dilation up to a 56 [place] size with [facility] dilators.  Hiatal hernia   ESOPHAGOGASTRODUODENOSCOPY (EGD) WITH PROPOFOL N/A 09/07/2016   Dr. Gala Romney: Normal, status post empiric dilation of the esophagus for history of dysphagia   ESOPHAGOGASTRODUODENOSCOPY (EGD) WITH PROPOFOL N/A 05/02/2019   Normal esophagus s/p dilation, normal stomach, normal duodenum   ESOPHAGOGASTRODUODENOSCOPY (EGD) WITH PROPOFOL N/A 01/15/2020   normal esophagus s/p dilation, gastric nodule s/p biopsy. This showed reactive gastropathy with H.pylori.    EXTERNAL EAR SURGERY     bilateral   FIDUCIAL MARKER PLACEMENT  06/14/2021   Procedure: FIDUCIAL MARKER PLACEMENT;  Surgeon: Garner Nash, DO;  Location: [place] [facility];  Service: Pulmonary;;   FOOT SURGERY     GLAUCOMA SURGERY     KNEE ARTHROPLASTY Right 11/04/2020    Procedure: COMPUTER ASSISTED TOTAL KNEE ARTHROPLASTY;  Surgeon: Rod Can, MD;  [facility];  Service: Orthopedics;  Laterality: Right;   MALONEY DILATION N/A 09/07/2016   Procedure: Venia Minks DILATION;  Surgeon: Daneil Dolin, MD;  [facility];  Service: Endoscopy;  Laterality: N/A;   MALONEY DILATION N/A 05/02/2019   Procedure: Venia Minks DILATION;  Surgeon: Daneil Dolin, MD;  [facility];  Service: Endoscopy;  Laterality: N/A;   MALONEY DILATION N/A 01/15/2020   Procedure: Venia Minks DILATION;  Surgeon: Daneil Dolin, MD;  [facility];  Service: Endoscopy;  Laterality: N/A;   POLYPECTOMY  09/07/2016   Procedure: POLYPECTOMY;  Surgeon: Daneil Dolin, MD;  [facility];  Service: Endoscopy;;  sigmoid colon x4   POLYPECTOMY  01/25/2022   Procedure: POLYPECTOMY;  Surgeon: Daneil Dolin, MD;  [facility];  Service: Endoscopy;;  hepatic flexure and descending/sigmoid colon   small bowel capsule  10/2007   normal   VIDEO BRONCHOSCOPY WITH RADIAL ENDOBRONCHIAL  ULTRASOUND  06/14/2021   Procedure: VIDEO BRONCHOSCOPY WITH RADIAL ENDOBRONCHIAL ULTRASOUND;  Surgeon: Garner Nash, DO;  [facility];  Service: Pulmonary;;     FAMILY HISTORY:  Family History  Problem Relation Age of Onset   Stroke Mother    Heart attack Father    Sleep apnea Father    Depression Sister    Schizophrenia Sister    Schizophrenia Other    Drug abuse Other    Alcohol abuse Other    Colon cancer Other        aunt   Obesity Other    COPD Other    Anxiety disorder Other    GER disease Other    Diabetes type II Other    Anxiety disorder Other    Depression Other    Liver disease Neg Hx    Inflammatory bowel disease Neg Hx      SOCIAL HISTORY:  reports that she has been smoking cigarettes. She has a 20.00 pack-year smoking history. She has been exposed to tobacco smoke. She has never used smokeless tobacco. She reports that she  does not drink alcohol and does not use drugs.The patient is divorced and lives independently but has a legal guardian.  She has an in-home caregiver who visits with her most days.   ALLERGIES: Thorazine [chlorpromazine hcl], Acetaminophen, Aspirin, Aspirin-acetaminophen-caffeine, Nsaids, Other, Penicillins, and Tomato   MEDICATIONS:  Current Outpatient Medications  Medication Sig Dispense Refill   PREPARATION H 0.25 % SUPP UNWRAP AND PLACE 1 SUPPOSITORY RECTALLY 2 TIMES DAILY AS NEEDED FOR HEMORRHOIDS/ANAL ITCH 12 suppository 0   albuterol (PROVENTIL) (2.5 MG/3ML) 0.083% nebulizer solution Take 3 mLs (2.5 mg total) by nebulization every 6 (six) hours as needed for wheezing or shortness of breath. 75 mL 12   atorvastatin (LIPITOR) 10 MG tablet Take 1 tablet (10 mg total) by mouth daily. 30 tablet 6   benztropine (COGENTIN) 1 MG tablet Take 1 tablet (1 mg total) by mouth daily. 30 tablet 5   cycloSPORINE (RESTASIS) 0.05 % ophthalmic emulsion Place 1 drop into both eyes 2 (two) times daily.     dicyclomine (BENTYL) 10 MG capsule Take one capsule before breakfast and one capsule before supper. 60 capsule 10   famotidine (PEPCID) 20 MG tablet Take 1 tablet (20 mg total) by mouth 2 (two) times daily. 60 tablet 5   hydrocortisone (ANUSOL-HC) 25 MG suppository Place 25 mg rectally 2 (two) times daily.     lamoTRIgine (LAMICTAL) 100 MG tablet Take 1 tablet (100 mg total) by mouth 2 (two) times daily. 60 tablet 5   paliperidone (INVEGA) 9 MG 24 hr tablet Take 1 tablet (9 mg total) by mouth daily. 30 tablet 5   potassium chloride (KLOR-CON) 10 MEQ tablet TAKE 2 TABLETS (20MEQ) BY MOUTH TWICE DAILY WITH FOOD 120 tablet 5   propranolol (INDERAL) 10 MG tablet TAKE 1 TABLET BY MOUTH THREE TIMES A DAY. 90 tablet 5   RABEprazole (ACIPHEX) 20 MG tablet TAKE 1 TABLET BY MOUTH TWICE A DAY. (Patient taking differently: Take 20 mg by mouth in the morning and at bedtime.) 180 tablet 3   senna (SENOKOT) 8.6 MG TABS  tablet Take 1 tablet (8.6 mg total) by mouth at bedtime. 30 tablet 10   sertraline (ZOLOFT) 100 MG tablet Take 100 mg by mouth daily.     torsemide (DEMADEX) 20 MG tablet TAKE (1) TABLET BY MOUTH EACH MORNING. 30 tablet 5   traZODone (DESYREL) 100 MG tablet TAKE 2  TABLETS(200MG ) BY MOUTH AT BEDTIME. 60 tablet 5   TRELEGY ELLIPTA 100-62.5-25 MCG/ACT AEPB INHALE (1) PUFF INTO THE LUNGS ONCE DAILY. 60 each 5   No current facility-administered medications for this encounter.     REVIEW OF SYSTEMS: On review of systems, the patient's caregiver states that she is doing well.  She just saw Dr. Wolfgang Phoenix her PCP earlier this week, they continue to try to encourage her in healthy eating.  She does continue to have mild shortness of breath a she continues to also follow with pulmonary.  No other complaints are verbalized.  PHYSICAL EXAM:  Unable to assess given encounter type  ECOG = 1  0 - Asymptomatic (Fully active, able to carry on all predisease activities without restriction)  1 - Symptomatic but completely ambulatory (Restricted in physically strenuous activity but ambulatory and able to carry out work of a light or sedentary nature. For example, light housework, office work)  2 - Symptomatic, <50% in bed during the day (Ambulatory and capable of all self care but unable to carry out any work activities. Up and about more than 50% of waking hours)  3 - Symptomatic, >50% in bed, but not bedbound (Capable of only limited self-care, confined to bed or chair 50% or more of waking hours)  4 - Bedbound (Completely disabled. Cannot carry on any self-care. Totally confined to bed or chair)  5 - Death   Eustace Pen MM, Creech RH, Tormey DC, et al. [id number]). "Toxicity and response criteria of the [facility]". [place] Oncol. 5 (6): 649-55    LABORATORY DATA:  Lab Results  Component Value Date   WBC 4.1 03/23/2022   HGB 13.9 03/23/2022   HCT 42.4 03/23/2022   MCV 95.9  03/23/2022   PLT 149 (L) 03/23/2022   Lab Results  Component Value Date   NA 140 06/28/2022   K 4.2 06/28/2022   CL 97 06/28/2022   CO2 29 06/28/2022   Lab Results  Component Value Date   ALT 16 06/28/2022   AST 26 06/28/2022   ALKPHOS 106 06/28/2022   BILITOT 0.3 06/28/2022      RADIOGRAPHY: CT CHEST W CONTRAST  Result Date: 08/10/2022 CLINICAL DATA:  Non-small cell lung cancer post SBRT. * Tracking Code: BO * EXAM: CT CHEST WITH CONTRAST TECHNIQUE: Multidetector CT imaging of the chest was performed during intravenous contrast administration. RADIATION DOSE REDUCTION: This exam was performed according to the departmental dose-optimization program which includes automated exposure control, adjustment of the mA and/or kV according to patient size and/or use of iterative reconstruction technique. CONTRAST:  24mL OMNIPAQUE IOHEXOL 300 MG/ML  SOLN COMPARISON:  Chest CT 01/13/2022 and 05/26/2021.  PET-CT 04/01/2021. FINDINGS: Cardiovascular: No acute vascular findings are demonstrated. There is atherosclerosis of the aorta, great vessels and coronary arteries. The heart size is normal. There is no pericardial effusion. Mediastinum/Nodes: There are no enlarged mediastinal, hilar or axillary lymph nodes. The thyroid gland, trachea and esophagus demonstrate no significant findings. Lungs/Pleura: No pleural effusion or pneumothorax. Mild centrilobular emphysema with mild diffuse central airway thickening. There are stable treatment changes in the right middle lobe with volume loss and consolidation peripheral to the biopsy clips. No recurrent mass lesion identified. There is no suspicious pulmonary nodularity. Upper abdomen: The visualized upper abdomen appears stable without suspicious findings. Musculoskeletal/Chest wall: There is no chest wall mass or suspicious osseous finding. IMPRESSION: 1. Stable treatment changes in the right middle lobe. No evidence of local recurrence or metastatic  disease. 2.  Aortic Atherosclerosis (ICD10-I70.0) and Emphysema (ICD10-J43.9). Electronically Signed   By: Richardean Sale M.D.   On: 08/10/2022 09:50       IMPRESSION/PLAN: 1. Carcinoid tumor in the right middle lobe.  I spoke with the patient's guardians today and we discussed the results of her CT scan.  She has done well since her treatment and he remains without concerns for malignancy.  We will continue to follow her current NCCN guidelines for non-small cell lung cancer at 70-month intervals.  Her guardian will keep [place] informed of any questions or concerns arise prior to her next visit which we would schedule in the summertime.  Please  This encounter was conducted via telephone.  The patient has provided two factor identification and has given verbal consent for this type of encounter and has been advised to only accept a meeting of this type in a secure network environment. The time spent during this encounter was 35 minutes including preparation, discussion, and coordination of the patient's care. The attendants for this meeting include Hayden Pedro and Sharyn Lull Bullins. During the encounter,  Hayden Pedro was located at [facility] Radiation Oncology Department.  Lanae Boast was located at home and not a part of the discussion but her Guardian and Case Manager [name] was at work.      Carola Rhine, [facility]   **Disclaimer: This note was dictated with voice recognition software. Similar sounding words can inadvertently be transcribed and this note may contain transcription errors which may not have been corrected upon publication of note.**

## 2022-08-14 ENCOUNTER — Ambulatory Visit: Payer: 59 | Admitting: Radiation Oncology

## 2022-08-17 ENCOUNTER — Ambulatory Visit: Payer: Self-pay | Admitting: *Deleted

## 2022-08-17 LAB — HM DIABETES EYE EXAM

## 2022-08-17 NOTE — Patient Outreach (Signed)
  Care Coordination   Follow Up Visit Note   08/18/2022 Name: [name] MRN: [id number] DOB: 31-Dec-1960  [name] is a 62 y.o. year old female who sees Luking, Elayne Snare, MD for primary care. I spoke with  Lanae Boast by phone today.  What matters to the patients health and wellness today?  Doing well and appreciative of oncology report indicating her treatments worked and she now has standard follow ups every 6 months  IBS- Still eating lots of greasy food that causes various episodes of constipation to diarrhea  No social determinants of health (SDOH) needs at this time Has aide that visits daily plus her other Education officer, museum and visitors     Goals Addressed               This Visit's Progress     Patient Stated     improve worsenting respiratory symptoms (THN) (pt-stated)   On track     Care Coordination Interventions: Evaluation of current treatment plan related to obstructive sleep apnea, need for new CPAP and patient's adherence to plan as established by provider Discussed plans with patient for ongoing care management follow up and provided patient with direct contact information for care management team Confirmed she was diagnosed with pneumonia  Confirmed she has not received a nebulizer not CPAP ordered by her  pulmonologist Confirmed she continues to smoke Outreach to Dr Valeta Harms office to inquire about a nebulizer for the patient. Spoke with Estill Bamberg who will send a message to Dr Valeta Harms after assessing for the need of a nebulizer Assessed CPAP use, sleep study tests Discussed the availability of virtual &  telephone medical appointments  07/20/22 Spoke with patient who denies any problems with her breathing presently.  She has seen the doctor for her sleep apnea and referral being done for sleep test per patient.  She is using her nebulizer twice a day.         maintain control of blood pressure at home [facility]) (pt-stated)   On track     Care Coordination  Interventions: Evaluation of current treatment plan related to hypertension self management and patient's adherence to plan as established by provider Discussed plans with patient for ongoing care management follow up and provided patient with direct contact information for care management team  Assessed if she has purchased her blood pressure cuff Encouragement provided   Spoke with April at [facility] to inquire if patient had any options to have a blood pressure cuff and a nebulizer delivered to her.  April confirmed no nebulizer orders and [facility] does not accept the U card for a blood pressure cuff. Will update patient  07/20/22 Spoke with patient she states her blood pressure has been good and that her aide checks her pressure when she is in the home.  Patient unable to give numbers.         SDOH assessments and interventions completed:  No     Care Coordination Interventions:  Yes, provided   Follow up plan: Follow up call scheduled for 09/28/22    Encounter Outcome:  Pt. Visit Completed   Raquon Milledge L. Lavina Hamman, RN, BSN, [place] Coordinator Office number [phone]

## 2022-08-18 ENCOUNTER — Encounter: Payer: Self-pay | Admitting: Podiatry

## 2022-08-18 ENCOUNTER — Ambulatory Visit (INDEPENDENT_AMBULATORY_CARE_PROVIDER_SITE_OTHER): Payer: 59 | Admitting: Podiatry

## 2022-08-18 DIAGNOSIS — M79674 Pain in right toe(s): Secondary | ICD-10-CM | POA: Diagnosis not present

## 2022-08-18 DIAGNOSIS — B351 Tinea unguium: Secondary | ICD-10-CM

## 2022-08-18 DIAGNOSIS — M79675 Pain in left toe(s): Secondary | ICD-10-CM

## 2022-08-18 DIAGNOSIS — E1142 Type 2 diabetes mellitus with diabetic polyneuropathy: Secondary | ICD-10-CM | POA: Diagnosis not present

## 2022-08-18 NOTE — Patient Instructions (Signed)
Visit Information  Thank you for taking time to visit with me today. Please don't hesitate to contact me if I can be of assistance to you.   Following are the goals we discussed today:   Goals Addressed               This Visit's Progress     Patient Stated     improve worsenting respiratory symptoms (THN) (pt-stated)   On track     Care Coordination Interventions: Evaluation of current treatment plan related to obstructive sleep apnea, need for new CPAP and patient's adherence to plan as established by provider Discussed plans with patient for ongoing care management follow up and provided patient with direct contact information for care management team Confirmed she was diagnosed with pneumonia  Confirmed she has not received a nebulizer not CPAP ordered by her  pulmonologist Confirmed she continues to smoke Outreach to Dr Valeta Harms office to inquire about a nebulizer for the patient. Spoke with Estill Bamberg who will send a message to Dr Valeta Harms after assessing for the need of a nebulizer Assessed CPAP use, sleep study tests Discussed the availability of virtual &  telephone medical appointments  07/20/22 Spoke with patient who denies any problems with her breathing presently.  She has seen the doctor for her sleep apnea and referral being done for sleep test per patient.  She is using her nebulizer twice a day.         maintain control of blood pressure at home [facility]) (pt-stated)   On track     Care Coordination Interventions: Evaluation of current treatment plan related to hypertension self management and patient's adherence to plan as established by provider Discussed plans with patient for ongoing care management follow up and provided patient with direct contact information for care management team  Assessed if she has purchased her blood pressure cuff Encouragement provided   Spoke with April at [employer] to inquire if patient had any options to have a blood pressure cuff and a  nebulizer delivered to her.  April confirmed no nebulizer orders and [facility] does not accept the U card for a blood pressure cuff. Will update patient  07/20/22 Spoke with patient she states her blood pressure has been good and that her aide checks her pressure when she is in the home.  Patient unable to give numbers.         Our next appointment is by telephone on 09/28/22 at 1:30 pm  Please call the care guide team at [phone] if you need to cancel or reschedule your appointment.   If you are experiencing a Mental Health or [place] or need someone to talk to, please call the Suicide and Crisis Lifeline: 988 call the [place] National Suicide Prevention Lifeline: 505-696-2704 or TTY: 984 643 4537 TTY 828-293-3837) to talk to a trained counselor call 1-800-273-TALK (toll free, 24 hour hotline) call the [facility]: ([phone] call 911   Patient verbalizes understanding of instructions and care plan provided today and agrees to view in [place]. Active MyChart status and patient understanding of how to access instructions and care plan via MyChart confirmed with patient.     The patient has been provided with contact information for the care management team and has been advised to call with any health related questions or concerns.    Ayanah Snader L. Lavina Hamman, RN, BSN, [place] Coordinator Office number [phone]

## 2022-08-18 NOTE — Progress Notes (Signed)
This patient returns to my office for at risk foot care.  This patient requires this care by a professional since this patient will be at risk due to having  diabetes.  This patient is unable to cut nails himself since the patient cannot reach his nails.These nails are painful walking and wearing shoes.  This patient presents for at risk foot care today.  General Appearance  Alert, conversant and in no acute stress.  Vascular  Dorsalis pedis and posterior tibial  pulses are palpable  bilaterally.  Capillary return is within normal limits  bilaterally. Temperature is within normal limits  bilaterally.  Neurologic  Senn-Weinstein monofilament wire test within normal limits  bilaterally. Muscle power within normal limits bilaterally.  Nails Thick disfigured discolored nails with subungual debris  from hallux to fifth toes bilaterally. No evidence of bacterial infection or drainage bilaterally.  Orthopedic  No limitations of motion  feet .  No crepitus or effusions noted.  No bony pathology or digital deformities noted.  Skin  normotropic skin with no porokeratosis noted bilaterally.  No signs of infections or ulcers noted.     Onychomycosis  Pain in right toes  Pain in left toes  Consent was obtained for treatment procedures.   Mechanical debridement of nails 1-5  bilaterally performed with a nail nipper.  Filed with dremel without incident.    Return office visit    4  months                  Told patient to return for periodic foot care and evaluation due to potential at risk complications.   Gardiner Barefoot DPM

## 2022-08-24 ENCOUNTER — Other Ambulatory Visit (HOSPITAL_COMMUNITY): Payer: Self-pay | Admitting: Psychiatry

## 2022-08-24 ENCOUNTER — Other Ambulatory Visit: Payer: Self-pay | Admitting: Family Medicine

## 2022-08-25 NOTE — Telephone Encounter (Signed)
Call SW for appt

## 2022-08-28 ENCOUNTER — Other Ambulatory Visit: Payer: Self-pay | Admitting: Family Medicine

## 2022-08-29 ENCOUNTER — Encounter: Payer: Self-pay | Admitting: *Deleted

## 2022-09-01 ENCOUNTER — Encounter (HOSPITAL_COMMUNITY): Payer: Self-pay

## 2022-09-01 ENCOUNTER — Other Ambulatory Visit: Payer: Self-pay

## 2022-09-01 ENCOUNTER — Emergency Department (HOSPITAL_COMMUNITY)
Admission: EM | Admit: 2022-09-01 | Discharge: 2022-09-02 | Disposition: A | Discharge status: Home / Self Care | Payer: 59 | Attending: Student | Admitting: Student

## 2022-09-01 DIAGNOSIS — E876 Hypokalemia: Secondary | ICD-10-CM | POA: Diagnosis not present

## 2022-09-01 DIAGNOSIS — Z79899 Other long term (current) drug therapy: Secondary | ICD-10-CM | POA: Diagnosis not present

## 2022-09-01 DIAGNOSIS — K625 Hemorrhage of anus and rectum: Secondary | ICD-10-CM | POA: Insufficient documentation

## 2022-09-01 DIAGNOSIS — Z85118 Personal history of other malignant neoplasm of bronchus and lung: Secondary | ICD-10-CM | POA: Insufficient documentation

## 2022-09-01 DIAGNOSIS — K6289 Other specified diseases of anus and rectum: Secondary | ICD-10-CM | POA: Insufficient documentation

## 2022-09-01 DIAGNOSIS — F17210 Nicotine dependence, cigarettes, uncomplicated: Secondary | ICD-10-CM | POA: Diagnosis not present

## 2022-09-01 DIAGNOSIS — I10 Essential (primary) hypertension: Secondary | ICD-10-CM | POA: Diagnosis not present

## 2022-09-01 DIAGNOSIS — E119 Type 2 diabetes mellitus without complications: Secondary | ICD-10-CM | POA: Diagnosis not present

## 2022-09-01 LAB — COMPREHENSIVE METABOLIC PANEL
ALT: 16 U/L (ref 0–44)
AST: 21 U/L (ref 15–41)
Albumin: 3.6 g/dL (ref 3.5–5.0)
Alkaline Phosphatase: 78 U/L (ref 38–126)
Anion gap: 10 (ref 5–15)
BUN: 7 mg/dL (ref 6–20)
CO2: 26 mmol/L (ref 22–32)
Calcium: 8.4 mg/dL — ABNORMAL LOW (ref 8.9–10.3)
Chloride: 95 mmol/L — ABNORMAL LOW (ref 98–111)
Creatinine, Ser: 0.72 mg/dL (ref 0.44–1.00)
GFR, Estimated: >60 mL/min (ref >60)
Glucose, Bld: 104 mg/dL — ABNORMAL HIGH (ref 70–99)
Potassium: 3.4 mmol/L — ABNORMAL LOW (ref 3.5–5.1)
Sodium: 131 mmol/L — ABNORMAL LOW (ref 135–145)
Total Bilirubin: 0.5 mg/dL (ref 0.3–1.2)
Total Protein: 6.6 g/dL (ref 6.5–8.1)

## 2022-09-01 LAB — CBC WITH DIFFERENTIAL/PLATELET
Abs Immature Granulocytes: 0.01 10*3/uL (ref 0.00–0.07)
Basophils Absolute: 0 10*3/uL (ref 0.0–0.1)
Basophils Relative: 1 %
Eosinophils Absolute: 0 10*3/uL (ref 0.0–0.5)
Eosinophils Relative: 1 %
HCT: 39.1 % (ref 36.0–46.0)
Hemoglobin: 13 g/dL (ref 12.0–15.0)
Immature Granulocytes: 0 %
Lymphocytes Relative: 38 %
Lymphs Abs: 1.6 10*3/uL (ref 0.7–4.0)
MCH: 31.2 pg (ref 26.0–34.0)
MCHC: 33.2 g/dL (ref 30.0–36.0)
MCV: 93.8 fL (ref 80.0–100.0)
Monocytes Absolute: 0.3 10*3/uL (ref 0.1–1.0)
Monocytes Relative: 8 %
Neutro Abs: 2.2 10*3/uL (ref 1.7–7.7)
Neutrophils Relative %: 52 %
Platelets: 143 10*3/uL — ABNORMAL LOW (ref 150–400)
RBC: 4.17 MIL/uL (ref 3.87–5.11)
RDW: 13.8 % (ref 11.5–15.5)
WBC: 4.3 10*3/uL (ref 4.0–10.5)
nRBC: 0 % (ref 0.0–0.2)

## 2022-09-01 MED ORDER — KETOROLAC TROMETHAMINE 15 MG/ML IJ SOLN
15.0000 mg | Freq: Once | INTRAMUSCULAR | Status: AC
  Administered 2022-09-01: 15 mg via INTRAVENOUS
  Filled 2022-09-01: qty 1

## 2022-09-01 NOTE — ED Provider Notes (Signed)
[facility] Provider Note  CSN: [id number] Arrival date & time: 09/01/22 2059  Chief Complaint(s) Rectal Bleeding  HPI CALYSTA LUCKING is a 62 y.o. female.  With PMH T2DM, HTN, IBS, MRDD, OSA, previous CVA, lung cancer who presents emergency room for evaluation of rectal pain and bleeding.  History obtained from patient's fianc who endorses decreased medical literacy and understanding of her current medical care but states that she has been complaining of rectal pain for over a month and the pain apparently has been worsening so he brought her to the ER today.  She endorses intermittent rectal bleeding and was seen as recently as 07/31/2022 by gastroenterology for her IBS and she is on rabeprazole, Bentyl and senna.  Patient reportedly also has a history of H. pylori but patient is not endorsing epigastric pain at this time.  Patient recently saw her primary care physician who has her on "a cream and a suppository".   Past Medical History Past Medical History:  Diagnosis Date   Anxiety    Aortic atherosclerosis ([place]) 09/19/2020   Seen on CAT scan 2021   Arthritis    Depression    Diabetes mellitus    Diabetes mellitus, type II ([place])    Dyspnea    Dysrhythmia    GERD (gastroesophageal reflux disease)    Heart murmur    HTN (hypertension)    Hyperglycemia    IBS (irritable bowel syndrome)    Lung nodules    right, followed by PCP, PET 11/2011   Mental retardation    MI (myocardial infarction) ([place])    Migraines    NAUSEA AND VOMITING 06/09/2010   Pneumonia    Sleep apnea    cpap    Stroke [facility])    Patient Active Problem List   Diagnosis Date Noted   Rectal pain 06/09/2022   DOE (dyspnea on exertion) 01/06/2022   Chronic cough 01/06/2022   Chronic diarrhea 11/22/2021   Primary malignant neoplasm of right middle lobe of lung ([place]) 09/08/2021   Lung nodule 06/14/2021   S/P bronchoscopy with biopsy    Degenerative arthritis of right  knee 11/04/2020   Osteoarthritis of right knee 11/04/2020   Aortic atherosclerosis ([place]) 0000000   History of Helicobacter pylori infection 07/23/2020   S/P hysterectomy 04/19/2020   History of vaginal bleeding 04/19/2020   Fall in bathtub    Neck pain    Primary osteoarthritis of both knees 01/27/2019   Hyperlipidemia 09/26/2017   Dysphagia 08/15/2016   Obstructive sleep apnea syndrome 06/27/2016   Rectal bleeding 04/07/2013   Melena 03/04/2013   Hematemesis 03/04/2013   Abdominal pain, epigastric 03/04/2013   Hypokalemia 03/04/2013   Smoker 01/16/2012   Diarrhea 12/26/2011   Carcinoid tumor of right lung    Abdominal pain 10/08/2011   Fatty liver 08/24/2011   Constipation 10/20/2010   NAUSEA AND VOMITING 06/09/2010   HEMATOCHEZIA 04/21/2010   OTHER DYSPHAGIA 04/21/2010   MILD MENTAL RETARDATION 02/17/2009   GLAUCOMA 02/17/2009   ANXIETY 01/15/2009   MIGRAINE HEADACHE 01/15/2009   Essential hypertension 01/15/2009   Hemorrhoids 01/15/2009   GASTROESOPHAGEAL REFLUX DISEASE, CHRONIC 01/15/2009   CONSTIPATION, CHRONIC 01/15/2009   IBS (irritable bowel syndrome) 01/15/2009   KNEE PAIN, CHRONIC 01/15/2009   BACK PAIN, CHRONIC 01/15/2009   History of colonic polyps 01/15/2009   Home Medication(s) Prior to Admission medications   Medication Sig Start Date End Date Taking? Authorizing Provider  naproxen (NAPROSYN) 375 MG tablet Take 1  tablet (375 mg total) by mouth 2 (two) times daily. 09/02/22  Yes Jaislyn Blinn, MD  albuterol (PROVENTIL) (2.5 MG/3ML) 0.083% nebulizer solution Take 3 mLs (2.5 mg total) by nebulization every 6 (six) hours as needed for wheezing or shortness of breath. 06/20/22   Icard, Octavio Graves, DO  atorvastatin (LIPITOR) 10 MG tablet Take 1 tablet (10 mg total) by mouth daily. 03/14/22   Kathyrn Drown, MD  benztropine (COGENTIN) 1 MG tablet Take 1 tablet (1 mg total) by mouth daily. 03/01/22   Cloria Spring, MD  cycloSPORINE (RESTASIS) 0.05 % ophthalmic  emulsion Place 1 drop into both eyes 2 (two) times daily.    [provider]  dicyclomine (BENTYL) 10 MG capsule Take one capsule before breakfast and one capsule before supper. 07/31/22   Mahala Menghini, PA-C  famotidine (PEPCID) 20 MG tablet Take 1 tablet (20 mg total) by mouth 2 (two) times daily. 07/25/22   Icard, Octavio Graves, DO  hydrocortisone (ANUSOL-HC) 25 MG suppository Place 25 mg rectally 2 (two) times daily. 03/24/22   [provider]  lamoTRIgine (LAMICTAL) 100 MG tablet TAKE 1 TABLET BY MOUTH TWICE A DAY. 08/25/22   Cloria Spring, MD  paliperidone (INVEGA) 9 MG 24 hr tablet TAKE 1 TABLET BY MOUTH ONCE A DAY. 08/25/22   Cloria Spring, MD  potassium chloride (KLOR-CON) 10 MEQ tablet TAKE 2 TABLETS (20MEQ) BY MOUTH TWICE DAILY WITH FOOD 03/14/22   Kathyrn Drown, MD  PREPARATION H 0.25 % SUPP UNWRAP AND PLACE 1 SUPPOSITORY RECTALLY 2 TIMES DAILY AS NEEDED FOR HEMORRHOIDS/ANAL E [facility] 08/30/22   Luking, Scott A, MD  propranolol (INDERAL) 10 MG tablet TAKE 1 TABLET BY MOUTH THREE TIMES A DAY. 08/25/22   Kathyrn Drown, MD  RABEprazole (ACIPHEX) 20 MG tablet TAKE 1 TABLET BY MOUTH TWICE A DAY. Patient taking differently: Take 20 mg by mouth in the morning and at bedtime. 12/16/21 07/31/22  Sherron Monday, NP  senna (SENOKOT) 8.6 MG TABS tablet Take 1 tablet (8.6 mg total) by mouth at bedtime. 07/31/22   Mahala Menghini, PA-C  sertraline (ZOLOFT) 100 MG tablet Take 100 mg by mouth daily. 04/21/22   [provider]  torsemide (DEMADEX) 20 MG tablet TAKE (1) TABLET BY MOUTH EACH MORNING. 08/25/22   Kathyrn Drown, MD  traZODone (DESYREL) 100 MG tablet TAKE 2 TABLETS('200MG'$ ) BY MOUTH AT BEDTIME. 03/01/22   Cloria Spring, MD  TRELEGY ELLIPTA 100-62.5-25 MCG/ACT AEPB INHALE (1) PUFF INTO THE LUNGS ONCE DAILY. 04/24/22   Kathyrn Drown, MD                                                                                                                                    Past  Surgical History Past Surgical History:  Procedure Laterality Date   ABDOMINAL HYSTERECTOMY     BIOPSY  01/15/2020   Procedure: BIOPSY;  Surgeon: Daneil Dolin, MD;  [facility];  Service: Endoscopy;;  gastric   BIOPSY  01/25/2022   Procedure: BIOPSY;  Surgeon: Daneil Dolin, MD;  [facility];  Service: Endoscopy;;  ascending;descending;sigmoid;   BRONCHIAL BIOPSY  06/14/2021   Procedure: BRONCHIAL BIOPSIES;  Surgeon: Garner Nash, DO;  Location: [facility];  Service: Pulmonary;;   BRONCHIAL BRUSHINGS  06/14/2021   Procedure: BRONCHIAL BRUSHINGS;  Surgeon: Garner Nash, DO;  Location: [facility];  Service: Pulmonary;;   BRONCHIAL NEEDLE ASPIRATION BIOPSY  06/14/2021   Procedure: BRONCHIAL NEEDLE ASPIRATION BIOPSIES;  Surgeon: Garner Nash, DO;  Location: [facility];  Service: Pulmonary;;   COLONOSCOPY  11/2007   hyperplastic polyps, prior hx of adenomas    COLONOSCOPY  05/2010   incomplete due to poor prep, hyperplastic rectal polyp   COLONOSCOPY  05/05/2002   Dimunitive polyps in the rectum and left colon, cold    biopsied/removed.  Scattered few left-sided diverticula.  Regular colonic   mucosa appeared normal   COLONOSCOPY N/A 05/28/2013   Rourk: mulitple tubular adenomas removed. next tcs 05/2016   COLONOSCOPY WITH PROPOFOL N/A 09/07/2016   Dr. Gala Romney: For hyperplastic polyps removed. Next colonoscopy March 2023 given history of adenomatous colon polyps in the past.   COLONOSCOPY WITH PROPOFOL N/A 01/25/2022   Redundant/elongated colon, melanosis coli, left-sided diverticula, multiple colon polyps, colonic lipoma. Negative active colitis, adenomas. 3 year surveillance.   ESOPHAGOGASTRODUODENOSCOPY  08/2007   moderate sized hiatal hernia   ESOPHAGOGASTRODUODENOSCOPY  05/2010   noncritical schatzki ring s/p 35F   ESOPHAGOGASTRODUODENOSCOPY (EGD) WITH ESOPHAGEAL DILATION N/A 02/06/2013   [id number] esophagus-s/p dilation up to a 45 [place]  size with [facility] dilators.  Hiatal hernia   ESOPHAGOGASTRODUODENOSCOPY (EGD) WITH PROPOFOL N/A 09/07/2016   Dr. Gala Romney: Normal, status post empiric dilation of the esophagus for history of dysphagia   ESOPHAGOGASTRODUODENOSCOPY (EGD) WITH PROPOFOL N/A 05/02/2019   Normal esophagus s/p dilation, normal stomach, normal duodenum   ESOPHAGOGASTRODUODENOSCOPY (EGD) WITH PROPOFOL N/A 01/15/2020   normal esophagus s/p dilation, gastric nodule s/p biopsy. This showed reactive gastropathy with H.pylori.    EXTERNAL EAR SURGERY     bilateral   FIDUCIAL MARKER PLACEMENT  06/14/2021   Procedure: FIDUCIAL MARKER PLACEMENT;  Surgeon: Garner Nash, DO;  Location: [facility];  Service: Pulmonary;;   FOOT SURGERY     GLAUCOMA SURGERY     KNEE ARTHROPLASTY Right 11/04/2020   Procedure: COMPUTER ASSISTED TOTAL KNEE ARTHROPLASTY;  Surgeon: Rod Can, MD;  [facility];  Service: Orthopedics;  Laterality: Right;   MALONEY DILATION N/A 09/07/2016   Procedure: Venia Minks DILATION;  Surgeon: Daneil Dolin, MD;  [facility];  Service: Endoscopy;  Laterality: N/A;   MALONEY DILATION N/A 05/02/2019   Procedure: Venia Minks DILATION;  Surgeon: Daneil Dolin, MD;  [facility];  Service: Endoscopy;  Laterality: N/A;   MALONEY DILATION N/A 01/15/2020   Procedure: Venia Minks DILATION;  Surgeon: Daneil Dolin, MD;  [facility];  Service: Endoscopy;  Laterality: N/A;   POLYPECTOMY  09/07/2016   Procedure: POLYPECTOMY;  Surgeon: Daneil Dolin, MD;  [facility];  Service: Endoscopy;;  sigmoid colon x4   POLYPECTOMY  01/25/2022   Procedure: POLYPECTOMY;  Surgeon: Daneil Dolin, MD;  [facility];  Service: Endoscopy;;  hepatic flexure and descending/sigmoid colon   small bowel capsule  10/2007   normal   VIDEO BRONCHOSCOPY WITH RADIAL ENDOBRONCHIAL ULTRASOUND  06/14/2021   Procedure: VIDEO BRONCHOSCOPY WITH RADIAL ENDOBRONCHIAL ULTRASOUND;  Surgeon:  Garner Nash, DO;  [facility];  Service: Pulmonary;;   Family History Family History  Problem Relation Age of Onset   Stroke Mother    Heart attack Father    Sleep apnea Father    Depression Sister    Schizophrenia Sister    Schizophrenia Other    Drug abuse Other    Alcohol abuse Other    Colon cancer Other        aunt   Obesity Other    COPD Other    Anxiety disorder Other    GER disease Other    Diabetes type II Other    Anxiety disorder Other    Depression Other    Liver disease Neg Hx    Inflammatory bowel disease Neg Hx     Social History Social History   Tobacco Use   Smoking status: Every Day    Packs/day: 0.50    Years: 40.00    Total pack years: 20.00    Types: Cigarettes    Passive exposure: Current   Smokeless tobacco: Never  Vaping Use   Vaping Use: Never used  Substance Use Topics   Alcohol use: No    Comment: occ   Drug use: No   Allergies Thorazine [chlorpromazine hcl], Acetaminophen, Aspirin, Aspirin-acetaminophen-caffeine, Nsaids, Other, Penicillins, and Tomato  Review of Systems Review of Systems  Gastrointestinal:  Positive for blood in stool and rectal pain.    Physical Exam Vital Signs  I have reviewed the triage vital signs BP 101/62 (BP Location: Right Arm)   Pulse 69   Temp 97.7 F (36.5 C) (Oral)   Resp 18   Ht '5\' 10"'$  (1.778 m)   Wt 113.4 kg   SpO2 98%   BMI 35.87 kg/m   Physical Exam Vitals and nursing note reviewed.  Constitutional:      General: She is not in acute distress.    Appearance: She is well-developed.  HENT:     Head: Normocephalic and atraumatic.  Eyes:     Conjunctiva/sclera: Conjunctivae normal.  Cardiovascular:     Rate and Rhythm: Normal rate and regular rhythm.     Heart sounds: No murmur heard. Pulmonary:     Effort: Pulmonary effort is normal. No respiratory distress.     Breath sounds: Normal breath sounds.  Abdominal:     Palpations: Abdomen is soft.     Tenderness:  There is abdominal tenderness.  Musculoskeletal:        General: No swelling.     Cervical back: Neck supple.  Skin:    General: Skin is warm and dry.     Capillary Refill: Capillary refill takes less than 2 seconds.  Neurological:     Mental Status: She is alert.  Psychiatric:        Mood and Affect: Mood normal.     ED Results and Treatments Labs (all labs ordered are listed, but only abnormal results are displayed) Labs Reviewed  COMPREHENSIVE METABOLIC PANEL - Abnormal; Notable for the following components:      Result Value   Sodium 131 (*)    Potassium 3.4 (*)    Chloride 95 (*)    Glucose, Bld 104 (*)    Calcium 8.4 (*)    All other components within normal limits  CBC WITH DIFFERENTIAL/PLATELET - Abnormal; Notable for the following components:   Platelets 143 (*)    All other components  within normal limits  SEDIMENTATION RATE  C-REACTIVE PROTEIN                                                                                                                          Radiology CT ABDOMEN PELVIS W CONTRAST  Result Date: 09/02/2022 CLINICAL DATA:  Left lower quadrant pain EXAM: CT ABDOMEN AND PELVIS WITH CONTRAST TECHNIQUE: Multidetector CT imaging of the abdomen and pelvis was performed using the standard protocol following bolus administration of intravenous contrast. RADIATION DOSE REDUCTION: This exam was performed according to the departmental dose-optimization program which includes automated exposure control, adjustment of the mA and/or kV according to patient size and/or use of iterative reconstruction technique. CONTRAST:  152m OMNIPAQUE IOHEXOL 300 MG/ML  SOLN COMPARISON:  03/22/2019 FINDINGS: Lower Chest: Right middle lobe atelectasis. Hepatobiliary: Normal hepatic contours. No intra- or extrahepatic biliary dilatation. The gallbladder is normal. Pancreas: Normal pancreas. No ductal dilatation or peripancreatic fluid collection. Spleen: Normal. Adrenals/Urinary  Tract: The adrenal glands are normal. No hydronephrosis, nephroureterolithiasis or solid renal mass. The urinary bladder is normal for degree of distention Stomach/Bowel: There is no hiatal hernia. Normal duodenal course and caliber. No small bowel dilatation or inflammation. No focal colonic abnormality. Normal appendix. Vascular/Lymphatic: There is calcific atherosclerosis of the abdominal aorta. No lymphadenopathy. Reproductive: Normal uterus. No adnexal mass. Other: None. Musculoskeletal: No bony spinal canal stenosis or focal osseous abnormality. IMPRESSION: No acute abnormality of the abdomen or pelvis. Aortic atherosclerosis (ICD10-I70.0). Electronically Signed   By: KUlyses JarredM.D.   On: 09/02/2022 01:04    Pertinent labs & imaging results that were available during my care of the patient were reviewed by me and considered in my medical decision making (see MDM for details).  Medications Ordered in ED Medications  ketorolac (TORADOL) 15 MG/ML injection 15 mg (15 mg Intravenous Given 09/01/22 2354)  iohexol (OMNIPAQUE) 300 MG/ML solution 100 mL (100 mLs Intravenous Contrast Given 09/02/22 0014)                                                                                                                                     Procedures Procedures  (including critical care time)  Medical Decision Making / ED Course   This patient presents to the ED for concern of rectal pain, this involves an extensive number of treatment options, and is a complaint that carries with it a high  risk of complications and morbidity.  The differential diagnosis includes hemorrhoids, rectal fissure, constipation, proctalgia fugax, rectal mass  MDM: Patient seen emergency room for evaluation of rectal pain.  Physical exam is unremarkable with no evidence of external hemorrhoids on exam.  No evidence of rectal fissure on exam.  Laboratory evaluation with a mild hypokalemia to 3.4 but is otherwise unremarkable.   Hemoglobin normal at 13.0, sed rate normal at 9 and I have low suspicion for inflammatory bowel disease.  Patient received single dose Toradol with symptomatic improvement of pain.  CT abdomen pelvis obtained in the setting of lung cancer to ensure that patient does not have any malignant findings in the abdomen causing her rectal pain and this was reassuringly negative.  At this time, she does not meet inpatient criteria for admission she is safe for outpatient GI follow-up.   Additional history obtained: -Additional history obtained from fianc -External records from outside source obtained and reviewed including: Chart review including previous notes, labs, imaging, consultation notes   Lab Tests: -I ordered, reviewed, and interpreted labs.   The pertinent results include:   Labs Reviewed  COMPREHENSIVE METABOLIC PANEL - Abnormal; Notable for the following components:      Result Value   Sodium 131 (*)    Potassium 3.4 (*)    Chloride 95 (*)    Glucose, Bld 104 (*)    Calcium 8.4 (*)    All other components within normal limits  CBC WITH DIFFERENTIAL/PLATELET - Abnormal; Notable for the following components:   Platelets 143 (*)    All other components within normal limits  SEDIMENTATION RATE  C-REACTIVE PROTEIN      Imaging Studies ordered: I ordered imaging studies including CTAP I independently visualized and interpreted imaging. I agree with the radiologist interpretation   Medicines ordered and prescription drug management: Meds ordered this encounter  Medications   ketorolac (TORADOL) 15 MG/ML injection 15 mg   iohexol (OMNIPAQUE) 300 MG/ML solution 100 mL   naproxen (NAPROSYN) 375 MG tablet    Sig: Take 1 tablet (375 mg total) by mouth 2 (two) times daily.    Dispense:  20 tablet    Refill:  0    -I have reviewed the patients home medicines and have made adjustments as needed  Critical interventions none   Cardiac Monitoring: The patient was maintained on a  cardiac monitor.  I personally viewed and interpreted the cardiac monitored which showed an underlying rhythm of: NSR  Social Determinants of Health:  Factors impacting patients care include: none   Reevaluation: After the interventions noted above, I reevaluated the patient and found that they have :improved  Co morbidities that complicate the patient evaluation  Past Medical History:  Diagnosis Date   Anxiety    Aortic atherosclerosis ([place]) 09/19/2020   Seen on CAT scan 2021   Arthritis    Depression    Diabetes mellitus    Diabetes mellitus, type II ([place])    Dyspnea    Dysrhythmia    GERD (gastroesophageal reflux disease)    Heart murmur    HTN (hypertension)    Hyperglycemia    IBS (irritable bowel syndrome)    Lung nodules    right, followed by PCP, PET 11/2011   Mental retardation    MI (myocardial infarction) ([place])    Migraines    NAUSEA AND VOMITING 06/09/2010   Pneumonia    Sleep apnea    cpap    Stroke ([place])  Dispostion: I considered admission for this patient, but at this time she does not meet inpatient criteria for admission she is safe for discharge with outpatient follow-up     Final Clinical Impression(s) / ED Diagnoses Final diagnoses:  Rectal pain     '@PCDICTATION'$ @    Leta Bucklin, Debe Coder, MD 09/02/22 [phone]

## 2022-09-01 NOTE — ED Triage Notes (Signed)
Pt complaining of rectal pain due to hemorrhoids. Michela Pitcher that it has been going on for the better part of a week.

## 2022-09-02 ENCOUNTER — Emergency Department (HOSPITAL_COMMUNITY): Payer: 59

## 2022-09-02 DIAGNOSIS — K6289 Other specified diseases of anus and rectum: Secondary | ICD-10-CM | POA: Diagnosis not present

## 2022-09-02 LAB — SEDIMENTATION RATE: Sed Rate: 9 mm/hr (ref 0–22)

## 2022-09-02 LAB — C-REACTIVE PROTEIN: CRP: <0.5 mg/dL (ref <1.0)

## 2022-09-02 MED ORDER — NAPROXEN 375 MG PO TABS: 375.0000 mg | ORAL_TABLET | Freq: Two times a day (BID) | ORAL | 0 refills | Status: DC

## 2022-09-02 MED ORDER — IOHEXOL 300 MG/ML  SOLN
100.0000 mL | Freq: Once | INTRAMUSCULAR | Status: AC | PRN
  Administered 2022-09-02: 100 mL via INTRAVENOUS

## 2022-09-04 ENCOUNTER — Telehealth: Payer: Self-pay

## 2022-09-04 NOTE — Telephone Encounter (Signed)
1.  I do not recommend naproxen with her situation I have a rectal bleeding 2.  I do recommend a follow-up visit this week or next week with me Thank you-Dr. Nicki Reaper

## 2022-09-04 NOTE — Telephone Encounter (Signed)
Pt went to the ER this past weekend she is bleeding from her bottom and before she left the Dr Rockey Situ her to get with Dr Nicki Reaper about pt going on Naproxin but patient don't know if she can take this medication or not. Do We need to do a ER follow up?   Charlett Nose [phone]

## 2022-09-07 ENCOUNTER — Encounter: Payer: Self-pay | Admitting: Radiology

## 2022-09-08 ENCOUNTER — Telehealth: Payer: Self-pay

## 2022-09-08 NOTE — Telephone Encounter (Signed)
Entered in error [employer]

## 2022-09-15 ENCOUNTER — Ambulatory Visit (INDEPENDENT_AMBULATORY_CARE_PROVIDER_SITE_OTHER): Payer: 59 | Admitting: Family Medicine

## 2022-09-15 VITALS — BP 115/75 | HR 78 | Wt 247.0 lb

## 2022-09-15 DIAGNOSIS — E871 Hypo-osmolality and hyponatremia: Secondary | ICD-10-CM | POA: Diagnosis not present

## 2022-09-15 DIAGNOSIS — E785 Hyperlipidemia, unspecified: Secondary | ICD-10-CM | POA: Diagnosis not present

## 2022-09-15 DIAGNOSIS — F251 Schizoaffective disorder, depressive type: Secondary | ICD-10-CM | POA: Diagnosis not present

## 2022-09-15 DIAGNOSIS — K6289 Other specified diseases of anus and rectum: Secondary | ICD-10-CM | POA: Diagnosis not present

## 2022-09-15 DIAGNOSIS — I700 Atherosclerosis of aorta: Secondary | ICD-10-CM

## 2022-09-15 NOTE — Progress Notes (Unsigned)
   Subjective:    Patient ID: [name], female    DOB: 01/15/1961, 62 y.o.   MRN: [id number]  HPI Patient arrives today for ED follow up. She relates a lot of rectal pain discomfort she denies blood in the stools she has been to the ER she has been to the gastroenterologist at times she gets constipated at times she gets loose stools denies fever chills sweats patient tends to have a lot of frequent symptoms of health issues She is followed by psychiatry mental health is stable She is also followed for aortic atherosclerosis on medications  Review of Systems     Objective:   Physical Exam General-in no acute distress Eyes-no discharge Lungs-respiratory rate normal, CTA CV-no murmurs,RRR Extremities skin warm dry no edema Neuro grossly normal Behavior normal, alert    Up-to-date on colonoscopy    Assessment & Plan:  Rectal pain Compounded OTC measures prescription given small amount up to 4 times a day Follow-up with gastroenterology as planned   1. Rectal pain See above  2. Hyponatremia Sodium was low 1 in the hospital ER repeat metabolic 7 before next visit - Basic Metabolic Panel (7)  3. Hyperlipidemia, unspecified hyperlipidemia type Hyperlipidemia check lipid profile before the next visit  Follow-up again in approxi-2 months time sooner if any issues Aorta atherosclerosis stable Schizo disorder followed by psychiatry under stable control

## 2022-09-15 NOTE — Patient Instructions (Signed)
Hi [name]  I reviewed over your lab work as well as your CAT scan Your CAT scan did not show any tumors or growths The blood work is something we will want to repeat again in 1 month's time  Please do your labs a few days before your follow-up visit We will see you in mid April  Using a stool softener once a day should help with your bowels stay regular If you are having diarrhea you can skip the stool softener on that day  We have written a prescription for a new cream for you to use to help with your rectal pain-only use a small amount maximum 4 times per day  Also I want you to fit in walking every day outside with your helper (unless of course it is raining)  We look forward to seeing you back again in mid April please take care  Dr. Sallee Lange

## 2022-09-16 DIAGNOSIS — F251 Schizoaffective disorder, depressive type: Secondary | ICD-10-CM | POA: Insufficient documentation

## 2022-09-18 ENCOUNTER — Ambulatory Visit: Payer: 59 | Admitting: Gastroenterology

## 2022-09-21 ENCOUNTER — Other Ambulatory Visit: Payer: Self-pay | Admitting: Family Medicine

## 2022-09-21 DIAGNOSIS — E785 Hyperlipidemia, unspecified: Secondary | ICD-10-CM

## 2022-09-22 ENCOUNTER — Other Ambulatory Visit: Payer: Self-pay | Admitting: Family Medicine

## 2022-09-26 ENCOUNTER — Ambulatory Visit: Payer: 59 | Admitting: Gastroenterology

## 2022-09-27 ENCOUNTER — Ambulatory Visit: Payer: Medicare Other | Admitting: Family Medicine

## 2022-09-28 ENCOUNTER — Ambulatory Visit: Payer: Self-pay | Admitting: *Deleted

## 2022-09-28 NOTE — Patient Outreach (Signed)
  Care Coordination   09/28/2022 Name: [name] MRN: [id number] DOB: Mar 28, 1961   Care Coordination Outreach Attempts:  An unsuccessful telephone outreach was attempted today to offer the patient information about available care coordination services as a benefit of their health plan.   Follow Up Plan:  Additional outreach attempts will be made to offer the patient care coordination information and services.   Encounter Outcome:  No Answer  Left message for patient The number in EPIC is for her sponsor [facility] Coordination Interventions:  No, not indicated     Charnetta Wulff L. Lavina Hamman, RN, BSN, [place] Coordinator Office number [phone]

## 2022-09-29 NOTE — Patient Outreach (Signed)
Care Coordination   Follow Up Visit Note   04/02/2023 Name: [name] MRN: [id number] DOB: Apr 14, 1961  [name] is a 62 y.o. year old female who sees Luking, Jonna Coup, MD for primary care. I spoke with  [name] & her sponsor, jennifer by phone today.  What matters to the patients health and wellness today?  Hemorrhoids, back pain, fell in wall and ended up on her back trying to get to her air conditioner  She reports using medicine and cream for hemorrhoids Frequent stools daily with pain of hemorrhoids She states she drinks fluids, walks and eats vegetables She reports seeing gastroenterology "four months ago" In EPIC it is noted patient cancelled visit to [facility] on 09/26/22- last visit was 07/31/22 for Irritable bowel syndrome (IBS) With [facility]- Discussed this with patient, encouraged to see GI doctor for possible change in treatment plan   Goals Addressed               This Visit's Progress     Patient Stated     COMPLETED: improve worsenting respiratory symptoms (THN) (pt-stated)        Merge duplicate goal      COMPLETED: maintain control of blood pressure at home [facility]) (pt-stated)        Merge duplicate goal      Other     manage chronic back pain, hemorrhoidal pain and attend GI visits- hypertension, breathing symptoms (RN CM)   Not on track     Interventions Today    Flowsheet Row Most Recent Value  Chronic Disease   Chronic disease during today's visit Other  [constipation, hemorrhoids- pain]  General Interventions   General Interventions Discussed/Reviewed General Interventions Reviewed, Sick Day Rules, Doctor Visits, Communication with  Doctor Visits Discussed/Reviewed Specialist  PCP/Specialist Visits Compliance with follow-up visit  [encouaged compliance with pcp and GI MD office visits]  Communication with PCP/Specialists  Exercise Interventions   Exercise Discussed/Reviewed Exercise Reviewed, Physical Activity  Physical Activity  Discussed/Reviewed Types of exercise  [confirmed she is active to help prevent/decrease IBS constipation/diarrhea symptoms. She reports she walks, drinks plenty of fluids and eats vegetables. Reviewed GI & pcp appointments. Notice patient with cancelled appointments]  Education Interventions   Education Provided Provided Education  Provided Verbal Education On Medication, Sick Day Rules                SDOH assessments and interventions completed:  No     Care Coordination Interventions:  Yes, provided   Follow up plan: Follow up call scheduled for 04/12/23    Encounter Outcome:  Pt. Visit Completed   Offie Pickron L. Noelle Penner, RN, BSN, CCM [facility] Care Management Community Coordinator Office number [phone]

## 2022-10-02 ENCOUNTER — Ambulatory Visit (INDEPENDENT_AMBULATORY_CARE_PROVIDER_SITE_OTHER): Payer: 59 | Admitting: Neurology

## 2022-10-02 DIAGNOSIS — G4733 Obstructive sleep apnea (adult) (pediatric): Secondary | ICD-10-CM | POA: Diagnosis not present

## 2022-10-02 DIAGNOSIS — G4734 Idiopathic sleep related nonobstructive alveolar hypoventilation: Secondary | ICD-10-CM

## 2022-10-02 DIAGNOSIS — R351 Nocturia: Secondary | ICD-10-CM

## 2022-10-02 DIAGNOSIS — R519 Headache, unspecified: Secondary | ICD-10-CM

## 2022-10-02 DIAGNOSIS — E669 Obesity, unspecified: Secondary | ICD-10-CM

## 2022-10-02 DIAGNOSIS — G4720 Circadian rhythm sleep disorder, unspecified type: Secondary | ICD-10-CM

## 2022-10-03 ENCOUNTER — Ambulatory Visit: Payer: 59 | Admitting: Gastroenterology

## 2022-10-05 NOTE — Procedures (Signed)
Physician Interpretation:     [facility] POLYSOMNOGRAPHY  INTERPRETATION REPORT   STUDY DATE:  10/02/2022     PATIENT NAME:  [name]         DATE OF BIRTH:  01/30/1961  PATIENT ID:  [id number]    TYPE OF STUDY:  PSG  READING PHYSICIAN: Star Age, MD, PhD   SCORING TECHNICIAN: Gaylyn Cheers, RPSGT  Referred by: Kathyrn Drown, MD  History and Indication for Testing: 62 year old female with an underlying complex medical history of aortic atherosclerosis, hypertension, hyperlipidemia, coronary artery disease with history of MI, diabetes, reflux disease, anxiety, depression, irritable bowel syndrome, lung nodules, history of stroke, history of pneumonia in September 2023, smoking, and obesity, who was previously diagnosed with obstructive sleep apnea and placed on PAP therapy. She no longer has a CPAP machine, she reports that it has been years. She is unclear as to how long it has been since she had a machine. Her Epworth sleepiness score is 13 out of 24, fatigue severity score is 45 out of 63.  Height: 69 in Weight: 250 lb (BMI 37) Neck Size: 17 in  MEDICATIONS: Proventil, Lipitor, Cogentin, Restasis, Bentyl, Pepcid, Lamictal, Imodium, Invega, Klor-Con, Inderal, Senokot, Zoloft, Demadex, Desyrel, Trelegy Ellipta, Aciphex TECHNICAL DESCRIPTION: A registered sleep technologist  was in attendance for the duration of the recording.  Data collection, scoring, video monitoring, and reporting were performed in compliance with the AASM Manual for the Scoring of Sleep and Associated Events; (Hypopnea is scored based on the criteria listed in Section VIII D. 1b in the AASM Manual V2.6 using a 4% oxygen desaturation rule or Hypopnea is scored based on the criteria listed in Section VIII D. 1a in the AASM Manual V2.6 using 3% oxygen desaturation and /or arousal rule).   SLEEP CONTINUITY AND SLEEP ARCHITECTURE:  Lights-out was at 21:32: and lights-on at  04:40:, with a  total recording time of 7 hours, 8.5 min. Total sleep time ( TST) was 409.5 minutes with a high sleep efficiency at 95.6%. There was  16.4% REM sleep.  BODY POSITION:  TST was divided  between the following sleep positions: 12.8% supine;  87.2% lateral;  0% prone. Duration of total sleep and percent of total sleep in their respective position is as follows: supine 52 minutes (13%), non-supine 357 minutes (87%); right 286 minutes (70%), left 71 minutes (17%), and prone 00 minutes (0%).  Total supine REM sleep time was 00 minutes (0% of total REM sleep).  Sleep latency was normal at 5.5 minutes.  REM sleep latency was increased at 159.5 minutes. Of the total sleep time, the percentage of stage N1 sleep was 1.6%, stage N2 sleep was 78%, which is markedly increased, stage N3 sleep was 3.8%, which is reduced and REM sleep was 16.4%, which is reduced. Wake after sleep onset (WASO) time accounted for 13.5 minutes with no significant sleep fragmentation noted.  RESPIRATORY MONITORING:   Based on CMS criteria (using a 4% oxygen desaturation rule for scoring hypopneas), there were 117 apneas (105 obstructive; 10 central; 2 mixed), and 27 hypopneas.  Apnea index was 17.1. Hypopnea index was 4.0. The apnea-hypopnea index was 21.1/hour overall (9.1/hour during supine sleep, 53.7/hour during REM sleep, 54/h non-supine; 0.0 supine REM).  There were 0 respiratory effort-related arousals (RERAs).  The RERA index was 0 events/h. Total respiratory disturbance index (RDI) was 21.1 events/h. RDI results showed: supine RDI  9.1 /h; non-supine RDI 22.9 /h; REM RDI 53.7 /h, supine  REM RDI 0.0 /h.   Based on AASM criteria (using a 3% oxygen desaturation and /or arousal rule for scoring hypopneas), there were 117 apneas (105 obstructive; 10 central; 2 mixed), and 41 hypopneas. Apnea index was 17.1. Hypopnea index was 6.0. The apnea-hypopnea index was 23.2 overall (10.3 supine, 58 non-supine; 58.2 REM, 0.0 supine REM).  There were 0  respiratory effort-related arousals (RERAs).  The RERA index was 0 events/h. Total respiratory disturbance index (RDI) was 23.2 events/h. RDI results showed: supine RDI  10.3 /h; non-supine RDI 25.0 /h; REM RDI 58.2 /h, supine REM RDI 0.0 /h.  OXIMETRY: Oxyhemoglobin Saturation Nadir during sleep was at  72%) from a mean of 90%.  Of the Total sleep time (TST)   hypoxemia (=<88%) was present for  126.6 minutes, or 30.9% of total sleep time.  LIMB MOVEMENTS: There were 4 periodic limb movements of sleep (0.6/hr), of which 1 (0.1/hr) were associated with an arousal. AROUSAL: There were 55 arousals in total, for an arousal index of 8 arousals/hour.  Of these, 22 were identified as respiratory-related arousals (3 /h), 1 were PLM-related arousals (0 /h), and 35 were non-specific arousals (5 /h). EEG: Review of the EEG showed no abnormal electrical discharges and symmetrical bihemispheric findings.   EKG: The EKG revealed normal sinus rhythm (NSR). The average heart rate during sleep was 72 bpm. AUDIO/VIDEO REVIEW: The audio and video review did not show any abnormal or unusual behaviors, movements, phonations or vocalizations. The patient took no restroom break. Snoring was noted, in the mild range. POST-STUDY QUESTIONNAIRE: Post study, the patient indicated, that sleep was better than usual.  IMPRESSION:  1. Moderate Obstructive Sleep Apnea (OSA) 2. Nocturnal hypoxemia 3. Dysfunctions associated with sleep stages or arousal from sleep RECOMMENDATIONS:  1. This study demonstrates moderate to severe obstructive sleep apnea, with a total AHI of 21.1/hour, REM AHI of 58.2/hour, supine AHI of 10.3/hour and O2 nadir of 72% (during  non-supine REM sleep). There was significant time below or at 88% saturation of over 120 minutes for the night, indicating nocturnal hypoxemia, average oxygen saturation was only 90% for the night. Evaluation for an underlying cause for lower oxygen saturation may be needed, even  besides OSA treatment. Therapy with a positive airway pressure (PAP) device is recommended.  The patient will be advised to proceed with home autoPAP therapy for now.  A laboratory attended, full night PAP-titration study can be considered down the road, to optimize therapy settings, mask fit, monitoring of tolerance and of proper oxygen saturations. Other treatment options may be limited, and may include (generally speaking) surgical options in selected patients. Concomitant weight loss is highly recommended. Please note that untreated obstructive sleep apnea may carry additional perioperative morbidity. Patients with significant obstructive sleep apnea should receive perioperative PAP therapy and the surgeons and particularly the anesthesiologist should be informed of the diagnosis and the severity of the sleep disordered breathing. 2. This study shows no significant sleep fragmentation but abnormal sleep stage percentages; these are nonspecific findings and per se do not signify an intrinsic sleep disorder or a cause for the patient's sleep-related symptoms. Causes include (but are not limited to) the first night effect of the sleep study, circadian rhythm disturbances, medication effect or an underlying mood disorder or medical problem.  3. The patient should be cautioned not to drive, work at heights, or operate dangerous or heavy equipment when tired or sleepy. Review and reiteration of good sleep hygiene measures should be pursued with any patient.  4. The patient will be seen in follow-up in the sleep clinic at [facility] for discussion of the test results, symptom and treatment compliance review, further management strategies, etc. The patient and the referring provider will be notified of the test results. The patient will be advised to follow up with PCP for further evaluation of lower average oxygen saturations during sleep. She has a history of pneumonia. Complete smoking cessation is highly recommended.     I certify that I have reviewed the entire raw data recording prior to the issuance of this report in accordance with the Standards of Accreditation of the American Academy of Sleep Medicine (AASM). Star Age, MD, PhD Medical Director, [facility] at [facility]) [place], [place] (Neurology and Sleep)               Technical Report:   General Information  Name: [name], [name] BMI: [id number] Physician: Star Age, MD  ID: [id number] Height: 68.8 in Technician: Gaylyn Cheers, RPSGT  Sex: Female Weight: 250.0 lb Record: [id number]  Age: 89 [January 27, 1961] Date: 10/02/2022    Medical & Medication History    [name] is a 62 year old female with an underlying complex medical history of aortic atherosclerosis, hypertension, hyperlipidemia, coronary artery disease with history of MI, diabetes, reflux disease, anxiety, depression, irritable bowel syndrome, lung nodules, history of stroke, history of pneumonia in September 2023, smoking, and obesity, who was previously diagnosed with obstructive sleep apnea and placed on PAP therapy. She no longer has a CPAP machine, she reports that it has been years. She is unclear as to how long it has been since she had a machine. Her Epworth sleepiness score is 13 out of 24, fatigue severity score is 45 out of 63.  Proventil, Lipitor, Cogentin, Restasis, Bentyl, Pepcid, Lamictal, Imodium, Invega, Klor-Con, Inderal, Senokot, Zoloft, Demadex, Desyrel, Trelegy Ellipta, Aciphex   Sleep Disorder      Comments   Patient arrived for a diagnostic polysomnogram. Patient has a history of OSA with PAP use. She was started on CPAP therapy, but she no longer has her equipment. She is willing to get started back on PAP therapy. Procedure explained and all questions answered. Standard paste setup without complications. Patient slept supine, left, and right. Mild snoring was heard. Respiratory events observed, worse in REM sleep. No obvious cardiac  arrhythmias noted. No PLMS observed. Patient had no restroom visit.    Lights out: 09:32:18 PM Lights on: 04:40:59 AM   Time Total Supine Side Prone Upright  Recording (TRT) 7h 8.44m 0h 59.65m 6h 9.22m 0h 0.80m 0h 0.31m  Sleep (TST) 6h 49.28m 0h 52.59m 5h 57.43m 0h 0.10m 0h 0.28m   Latency N1 N2 N3 REM Onset Per. Slp. Eff.  Actual 0h 0.53m 0h 1.47m 0h 48.31m 2h 39.34m 0h 5.15m 0h 6.47m 95.57%   Stg Dur Wake N1 N2 N3 REM  Total 19.0 6.5 320.5 15.5 67.0  Supine 6.5 0.5 49.5 2.5 0.0  Side 12.5 6.0 271.0 13.0 67.0  Prone 0.0 0.0 0.0 0.0 0.0  Upright 0.0 0.0 0.0 0.0 0.0   Stg % Wake N1 N2 N3 REM  Total 4.4 1.6 78.3 3.8 16.4  Supine 1.5 0.1 12.1 0.6 0.0  Side 2.9 1.5 66.2 3.2 16.4  Prone 0.0 0.0 0.0 0.0 0.0  Upright 0.0 0.0 0.0 0.0 0.0     Apnea Summary Sub Supine Side Prone Upright  Total 117 Total 117 6 111 0 0    REM 46 0 46 0 0  NREM 71 6 65 0 0  Obs 105 REM 45 0 45 0 0    NREM 60 6 54 0 0  Mix 2 REM 0 0 0 0 0    NREM 2 0 2 0 0  Cen 10 REM 1 0 1 0 0    NREM 9 0 9 0 0   Rera Summary Sub Supine Side Prone Upright  Total 0 Total 0 0 0 0 0    REM 0 0 0 0 0    NREM 0 0 0 0 0   Hypopnea Summary Sub Supine Side Prone Upright  Total 41 Total 41 3 38 0 0    REM 19 0 19 0 0    NREM 22 3 19  0 0   4% Hypopnea Summary Sub Supine Side Prone Upright  Total (4%) 27 Total 27 2 25  0 0    REM 14 0 14 0 0    NREM 13 2 11  0 0     AHI Total Obs Mix Cen  23.15 Apnea 17.14 15.38 0.29 1.47   Hypopnea 6.01 -- -- --  21.10 Hypopnea (4%) 3.96 -- -- --    Total Supine Side Prone Upright  Position AHI 23.15 10.29 25.04 0.00 0.00  REM AHI 58.21   NREM AHI 16.29   Position RDI 23.15 10.29 25.04 0.00 0.00  REM RDI 58.21   NREM RDI 16.29    4% Hypopnea Total Supine Side Prone Upright  Position AHI (4%) 21.10 9.14 22.86 0.00 0.00  REM AHI (4%) 53.73   NREM AHI (4%) 14.72   Position RDI (4%) 21.10 9.14 22.86 0.00 0.00  REM RDI (4%) 53.73   NREM RDI (4%) 14.72    Desaturation  Information Threshold: 2% <100% <90% <80% <70% <60% <50% <40%  Supine 40.0 0.0 0.0 0.0 0.0 0.0 0.0  Side 363.0 315.0 16.0 0.0 0.0 0.0 0.0  Prone 0.0 0.0 0.0 0.0 0.0 0.0 0.0  Upright 0.0 0.0 0.0 0.0 0.0 0.0 0.0  Total 403.0 315.0 16.0 0.0 0.0 0.0 0.0  Index 57.2 44.7 2.3 0.0 0.0 0.0 0.0   Threshold: 3% <100% <90% <80% <70% <60% <50% <40%  Supine 13.0 0.0 0.0 0.0 0.0 0.0 0.0  Side 252.0 234.0 14.0 0.0 0.0 0.0 0.0  Prone 0.0 0.0 0.0 0.0 0.0 0.0 0.0  Upright 0.0 0.0 0.0 0.0 0.0 0.0 0.0  Total 265.0 234.0 14.0 0.0 0.0 0.0 0.0  Index 37.6 33.2 2.0 0.0 0.0 0.0 0.0   Threshold: 4% <100% <90% <80% <70% <60% <50% <40%  Supine 5.0 0.0 0.0 0.0 0.0 0.0 0.0  Side 162.0 157.0 14.0 0.0 0.0 0.0 0.0  Prone 0.0 0.0 0.0 0.0 0.0 0.0 0.0  Upright 0.0 0.0 0.0 0.0 0.0 0.0 0.0  Total 167.0 157.0 14.0 0.0 0.0 0.0 0.0  Index 23.7 22.3 2.0 0.0 0.0 0.0 0.0   Threshold: 3% <100% <90% <80% <70% <60% <50% <40%  Supine 13 0 0 0 0 0 0  Side 252 234 14 0 0 0 0  Prone 0 0 0 0 0 0 0  Upright 0 0 0 0 0 0 0  Total 265 234 14 0 0 0 0   Awakening/Arousal Information # of Awakenings 11  Wake after sleep onset 13.13m  Wake after persistent sleep 13.16m   Arousal Assoc. Arousals Index  Apneas 17 2.5  Hypopneas 5 0.7  Leg Movements 6 0.9  Snore 0 0.0  PTT Arousals 0 0.0  Spontaneous  35 5.1  Total 62 9.1  Leg Movement Information PLMS LMs Index  Total LMs during PLMS 4 0.6  LMs w/ Microarousals 1 0.1   LM LMs Index  w/ Microarousal 5 0.7  w/ Awakening 0 0.0  w/ Resp Event 2 0.3  Spontaneous 15 2.2  Total 22 3.2     Desaturation threshold setting: 3% Minimum desaturation setting: 10 seconds SaO2 nadir: 72% The longest event was a 24 sec obstructive Hypopnea with a minimum SaO2 of 72%. The lowest SaO2 was 72% associated with a 24 sec obstructive Hypopnea. EKG Rates EKG Avg Max Min  Awake 74 89 66  Asleep 72 85 62  EKG Events: N/A

## 2022-10-05 NOTE — Addendum Note (Signed)
Addended by: Star Age on: 10/05/2022 04:55 PM   Modules accepted: Orders

## 2022-10-06 ENCOUNTER — Encounter: Payer: Self-pay | Admitting: *Deleted

## 2022-10-18 ENCOUNTER — Ambulatory Visit: Payer: 59 | Admitting: Gastroenterology

## 2022-10-18 ENCOUNTER — Telehealth: Payer: Self-pay

## 2022-10-18 NOTE — Telephone Encounter (Signed)
I called patient to discuss. No answer, left a message asking her to call us back. If patient returns call, please route to POD 4.

## 2022-10-18 NOTE — Telephone Encounter (Signed)
-----   Message from Huston Foley, MD sent at 10/05/2022  4:55 PM EDT ----- Patient referred by PCP, seen by me on 07/18/22, patient had a HST on 10/02/22.    Please call and notify the patient that the recent sleep study showed obstructive sleep apnea in the moderate to severe range. Her average oxygen saturation was only 90% and she had a significant time below 88%. I recommend FU with PCP to discuss possible underlying lung disease, weight loss and complete smoking cessation. I also recommend treatment for her sleep apnea with autoPAP, which means, that we don't have to bring her back in for a sleep study with CPAP, but will let her start using a so called autoPAP machine at home, which is a CPAP-like machine with self-adjusting pressures. We will send the order to a local DME company (of her choice, or as per insurance requirement). The DME representative will fit her with a mask, educate her on how to use the machine, how to put the mask on, etc. I have placed an order in the chart. Please send the order, talk to patient, send report to referring MD. We will need a FU in sleep clinic for 10 weeks post-PAP set up, please arrange that with me or one of our NPs. Also reinforce the need for compliance with treatment. Thanks,   Huston Foley, MD, PhD [facility])

## 2022-10-19 ENCOUNTER — Other Ambulatory Visit (HOSPITAL_COMMUNITY): Payer: Self-pay | Admitting: Family Medicine

## 2022-10-19 DIAGNOSIS — Z1231 Encounter for screening mammogram for malignant neoplasm of breast: Secondary | ICD-10-CM

## 2022-10-23 ENCOUNTER — Encounter: Payer: Self-pay | Admitting: *Deleted

## 2022-10-24 ENCOUNTER — Telehealth: Payer: Self-pay | Admitting: Family Medicine

## 2022-10-24 NOTE — Telephone Encounter (Signed)
Nurses Please get her social worker involved Tenita is not reachable or dependable see message below from sleep lab doctor  Guy Begin, RN  Babs Sciara, MD Good morning,  Thanks for reaching out.    If you could direct her to call us at [phone], we can explain things to her.  The machine and supplies come from a durable medical equipment company that takes her insurance.  They authorize first then connect with patient to proceed, then if ok to do so, then will order machine, then have her back to there office once machine comes in to show her how to use, clean, supplies and fit for mask.  Then we as providers office see her back in 31-89 days to go over use (for insurance complaince).  Then see her back yearly.  That's the process.  Andrey Campanile RN         Previous Messages

## 2022-10-27 NOTE — Telephone Encounter (Signed)
Left message to return call with social worker [name] 

## 2022-11-01 ENCOUNTER — Ambulatory Visit: Payer: Medicare Other | Admitting: Family Medicine

## 2022-11-03 ENCOUNTER — Encounter (HOSPITAL_COMMUNITY): Payer: Self-pay

## 2022-11-03 ENCOUNTER — Encounter: Payer: Self-pay | Admitting: Adult Health

## 2022-11-03 ENCOUNTER — Ambulatory Visit (INDEPENDENT_AMBULATORY_CARE_PROVIDER_SITE_OTHER): Payer: 59 | Admitting: Adult Health

## 2022-11-03 ENCOUNTER — Ambulatory Visit (HOSPITAL_COMMUNITY)
Admission: RE | Admit: 2022-11-03 | Discharge: 2022-11-03 | Disposition: A | Discharge status: Home / Self Care | Payer: 59 | Source: Ambulatory Visit | Attending: Family Medicine | Admitting: Family Medicine

## 2022-11-03 VITALS — BP 123/80 | HR 90 | Ht 70.5 in | Wt 240.5 lb

## 2022-11-03 DIAGNOSIS — K649 Unspecified hemorrhoids: Secondary | ICD-10-CM

## 2022-11-03 DIAGNOSIS — F70 Mild intellectual disabilities: Secondary | ICD-10-CM

## 2022-11-03 DIAGNOSIS — Z0000 Encounter for general adult medical examination without abnormal findings: Secondary | ICD-10-CM

## 2022-11-03 DIAGNOSIS — Z1231 Encounter for screening mammogram for malignant neoplasm of breast: Secondary | ICD-10-CM | POA: Insufficient documentation

## 2022-11-03 DIAGNOSIS — Y92009 Unspecified place in unspecified non-institutional (private) residence as the place of occurrence of the external cause: Secondary | ICD-10-CM | POA: Insufficient documentation

## 2022-11-03 DIAGNOSIS — W19XXXA Unspecified fall, initial encounter: Secondary | ICD-10-CM

## 2022-11-03 DIAGNOSIS — N644 Mastodynia: Secondary | ICD-10-CM

## 2022-11-03 DIAGNOSIS — Z01419 Encounter for gynecological examination (general) (routine) without abnormal findings: Secondary | ICD-10-CM

## 2022-11-03 DIAGNOSIS — Z1211 Encounter for screening for malignant neoplasm of colon: Secondary | ICD-10-CM

## 2022-11-03 DIAGNOSIS — Z90710 Acquired absence of both cervix and uterus: Secondary | ICD-10-CM

## 2022-11-03 LAB — HEMOCCULT GUIAC POC 1CARD (OFFICE): Fecal Occult Blood, POC: NEGATIVE

## 2022-11-03 NOTE — Progress Notes (Signed)
Patient ID: ALIVIYAH MALANGA, female   DOB: [birth year], 62 y.o.   MRN: [id number] History of Present Illness: Keyairra is a 62 year old black female,divorced, sp hysterectomy in for a well woman gyn exam. She said she feel yesterday and hit her left breast on door jam and then feel to floor. She says back and head hurts some too, but she got her self up. She has walker but did not have close by, she says she lives alone. Her case worker is with her.  She was supposed to have mammogram today, but they rescheduled when she told them she had pain.   PCP is Dr [name].    Current Medications, Allergies, Past Medical History, Past Surgical History, Family History and Social History were reviewed in [employer] record.     Review of Systems: Patient denies any hearing loss, fatigue, blurred vision, shortness of breath, chest pain, abdominal pain, problems with bowel movements(loose at times), urination(does seem to pee often), or intercourse. No joint pain or mood swings.  See HPI for positives.    Physical Exam:BP 123/80 (BP Location: Left Arm, Patient Position: Sitting, Cuff Size: Large)   Pulse 90   Ht 5' 10.5" (1.791 m)   Wt 240 lb 8 oz (109.1 kg)   BMI 34.02 kg/m   General:  Well developed, well nourished, no acute distress Skin:  Warm and dry Neck:  Midline trachea, normal thyroid, good ROM, no lymphadenopathy,no carotid bruits heard Lungs; Clear to auscultation bilaterally Breast:  No dominant palpable mass, retraction, or nipple discharge, she is tender over left breast, no bruising  Cardiovascular: Regular rate and rhythm Abdomen:  Soft, non tender, no hepatosplenomegaly Pelvic:  External genitalia is normal in appearance, no lesions.  The vagina is pale. Urethra has no lesions or masses. The cervix and uterus are absent.  No adnexal masses, mild tenderness noted.Bladder is non tender, no masses felt. Rectal: Good sphincter tone, no polyps,+hemorrhoids felt.   Hemoccult negative. Extremities/musculoskeletal:  No swelling or varicosities noted, no clubbing or cyanosis Psych:  No mood changes, alert and cooperative,seems happy AA is 4 Fall risk is high    11/03/2022    9:52 AM 09/15/2022   11:36 AM 06/28/2022   10:56 AM  Depression screen PHQ 2/9  Decreased Interest 3 3 3   Down, Depressed, Hopeless 3 3 3   PHQ - 2 Score 6 6 6   Altered sleeping 3 3 3   Tired, decreased energy 3 3 3   Change in appetite 2 1 1   Feeling bad or failure about yourself  3 2 0  Trouble concentrating 3 2 3   Moving slowly or fidgety/restless 0 0 0  Suicidal thoughts 0 0 0  PHQ-9 Score 20 17 16   Difficult doing work/chores  Very difficult Extremely dIfficult   See is on meds and sees Dr Tenny Craw    11/03/2022    9:54 AM 09/15/2022   11:37 AM 06/28/2022   10:57 AM 04/24/2022    2:31 PM  GAD 7 : Generalized Anxiety Score  Nervous, Anxious, on Edge 1 2 1 3   Control/stop worrying 3 3 3 3   Worry too much - different things 3 3 3 3   Trouble relaxing 3 3 1 3   Restless 3 3 3 3   Easily annoyed or irritable 3 2 3 3   Afraid - awful might happen 3 2 3 3   Total GAD 7 Score 19 18 17 21   Anxiety Difficulty  Somewhat difficult Very difficult Extremely difficult  Upstream - 11/03/22 0950       Pregnancy Intention Screening   Does the patient want to become pregnant in the next year? N/A    Does the patient's partner want to become pregnant in the next year? N/A    Would the patient like to discuss contraceptive options today? N/A      Contraception Wrap Up   Current Method Female Sterilization   hyst   End Method Female Sterilization   hyst   Contraception Counseling Provided No              Examination chaperoned by Malachy Mood LPN  Impression and Plan: 1. Encounter for well woman exam with routine gynecological exam Physical in 2 years Labs with PCP  Colonoscopy per GI   2. Encounter for screening fecal occult blood testing Hemoccult was negative   3.  Hemorrhoids, unspecified hemorrhoid type Follow up with Lewie Loron NP  4. Breast pain, left Has pain left breast from fall yesterday, no bruising or mass felt Will recheck breast 11/24/22  Has mammogram rescheduled for 11/27/22 at 10:30 am  5. Fall in home, initial encounter Larey Seat yesterday, she did get self up   6. S/P hysterectomy  7. MILD MENTAL RETARDATION

## 2022-11-10 ENCOUNTER — Telehealth: Payer: Self-pay | Admitting: Family Medicine

## 2022-11-10 NOTE — Telephone Encounter (Signed)
It should be noted that neurology diagnosed her with sleep apnea recommended CPAP Recommended that she also follow-up with her primary care She has an appointment with [place] at the end of May Neurology attempted to connect with the patient and her social worker and unable to do so We tried Art gallery manager as well Left message We will discuss all of this information further when the patient completes her follow-up office visit with [place].  This is at the end of May.  Thanks-Dr. Lorin Picket Gerda Diss

## 2022-11-15 ENCOUNTER — Other Ambulatory Visit: Payer: Self-pay | Admitting: Nurse Practitioner

## 2022-11-22 ENCOUNTER — Encounter: Payer: Self-pay | Admitting: Nurse Practitioner

## 2022-11-22 ENCOUNTER — Ambulatory Visit (INDEPENDENT_AMBULATORY_CARE_PROVIDER_SITE_OTHER): Payer: 59 | Admitting: Nurse Practitioner

## 2022-11-22 VITALS — BP 114/72 | HR 83 | Temp 98.8°F | Ht 70.5 in | Wt 238.8 lb

## 2022-11-22 DIAGNOSIS — Z720 Tobacco use: Secondary | ICD-10-CM

## 2022-11-22 DIAGNOSIS — J449 Chronic obstructive pulmonary disease, unspecified: Secondary | ICD-10-CM | POA: Insufficient documentation

## 2022-11-22 DIAGNOSIS — F17210 Nicotine dependence, cigarettes, uncomplicated: Secondary | ICD-10-CM

## 2022-11-22 DIAGNOSIS — C342 Malignant neoplasm of middle lobe, bronchus or lung: Secondary | ICD-10-CM

## 2022-11-22 NOTE — Assessment & Plan Note (Signed)
Severe COPD. Compensated on current regimen. Reviewed PFTs. Smoking cessation strongly advised. Understands risks of worsening lung function with continued smoking. Action plan in place.  Patient Instructions  Continue Trelegy 1 puff daily. Brush tongue and rinse mouth afterwards Continue Albuterol inhaler 2 puffs every 6 hours as needed for shortness of breath or wheezing. Notify if symptoms persist despite rescue inhaler/neb use. Continue Pepcid (famotidine) 20 mg prior to breakfast every morning and then 30 minutes before bedtime.    Try to decrease by 1 cigarette a week until you have quit. Goal quit date: July 1st  Attend CT in July once scheduled with radiation oncology    Follow up in 4 months with Dr. Tonia Brooms. If symptoms do not improve or worsen, please contact office for sooner follow up or seek emergency care.

## 2022-11-22 NOTE — Assessment & Plan Note (Signed)
Continued smoking. Cessation advised. Would not recommend wellbutrin or Chantix due to psychiatric history. She has not had any improvement with nicotine patches in the past.   The patient's current tobacco use: 1/4 ppd The patient was advised to quit and impact of smoking on their health.  I assessed the patient's willingness to attempt to quit. I provided methods and skills for cessation. We reviewed medication management of smoking session drugs if appropriate. Resources to help quit smoking were provided. A smoking cessation quit date was set: 01/08/2023 Follow-up was arranged in our clinic.  The amount of time spent counseling patient was 4 mins

## 2022-11-22 NOTE — Patient Instructions (Addendum)
Continue Trelegy 1 puff daily. Brush tongue and rinse mouth afterwards Continue Albuterol inhaler 2 puffs every 6 hours as needed for shortness of breath or wheezing. Notify if symptoms persist despite rescue inhaler/neb use. Continue Pepcid (famotidine) 20 mg prior to breakfast every morning and then 30 minutes before bedtime.    Try to decrease by 1 cigarette a week until you have quit. Goal quit date: July 1st  Attend CT in July once scheduled with radiation oncology    Follow up in 4 months with Dr. Tonia Brooms. If symptoms do not improve or worsen, please contact office for sooner follow up or seek emergency care.

## 2022-11-22 NOTE — Assessment & Plan Note (Signed)
S/p SBRT. No evidence of recurrence January 2024. Plan for repeat CT imaging July 2024. Follow up with radiation oncology as scheduled.

## 2022-11-22 NOTE — Progress Notes (Signed)
@Patient  ID: [name], female    DOB: Jun 13, 1961, 62 y.o.   MRN: [id number]  Chief Complaint  Patient presents with   Follow-up    Pt needs refill, pt has a persistent cough sputum yellow    Referring provider: Babs Sciara, MD  HPI: 62 year old female, current smoker followed for severe COPD and pulmonary nodule subsequently found to be neuroendocrine tumor.  She is a patient Dr. Myrlene Broker and last seen in office 01/06/2022 by [facility] NP.  Past medical history significant for hypertension, atherosclerosis, OSA, GERD, OSA, anxiety.  TEST/EVENTS:  05/18/2022 PFT: FVC 44, FEV1 42, ratio 70, TLC 83, DLCOcor 64. No BD. Severe obstructive disease with moderate diffusion defect  08/10/2022 CT chest w con: atherosclerosis. No LAD. Mild centrilobular emphysema with mild diffuse central airway thickening. Stable treatment changes in the RML with volume loss and consolidation. No suspicious nodularity.   09/12/2021: OV with Dr. Tonia Brooms.  Diagnosed with carcinoid tumor status post SBRT with radiation oncology.  Doing ok from a respiratory standpoint; previously had PFTs ordered but have yet to be completed.  She has a 50+ pack year history and is still smoking approximately 1 pack a day; smoking cessation strongly advised with quit date set of July 4.  Provided with samples of Trelegy.  Plans to enroll her in lung cancer screening program after she completes her surveillance CT imaging in April 2024.  01/06/2022: OV with Elke Holtry NP for follow-up.  She reports that she has been doing relatively well.  She has not quit smoking yet but she has cut back to 5 cigarettes a day.  She was previously tried on Trelegy which she had feels like it has helped her breathing.  She does still occasionally get short of breath but not nearly as winded as she used to.  She does have a chronic, daily productive cough, which is worse in the morning.  She also has GERD symptoms and associated nausea, which is also worse in the morning.   She denies any hemoptysis, fevers, night sweats, anorexia, recent weight loss.  PFTs previously ordered were never scheduled.  11/22/2022: Today - follow up Patient presents today for overdue follow up. She did have pulmonary function testing in November 2023 with severe obstruction and moderate diffusion defect. She feels unchanged compared to when she was here last. She still has some shortness of breath, especially with more strenuous activities. She can get dressed without getting short of breath. Occasionally gets winded with showering. She has a daily, productive cough, which is unchanged. She has chronic GERD symptoms. Tends to eat quickly and drink large amounts of water at one time, which we discussed at her last visit as well. She denies any hemoptysis, fevers, night sweats, anorexia, weight loss, wheezing. No flares requiring steroids/abx. She is using Trelegy daily. She is still smoking. She has follow up CT in July and will see radiation oncology afterwards.   Allergies  Allergen Reactions   Thorazine [Chlorpromazine Hcl] Anaphylaxis   Acetaminophen Other (See Comments)    Makes pt dizzy   Aspirin Other (See Comments)    seizure   Aspirin-Acetaminophen-Caffeine Other (See Comments)    seizure   Naproxen Nausea And Vomiting   Nsaids Nausea And Vomiting   Other     Acidic foods   Penicillins Nausea And Vomiting    Has patient had a PCN reaction causing immediate rash, facial/tongue/throat swelling, SOB or lightheadedness with hypotension:Yes Has patient had a PCN reaction causing severe rash  involving mucus membranes or skin necrosis:Yes Has patient had a PCN reaction that required hospitalization:Yes Has patient had a PCN reaction occurring within the last 10 years:No If all of the above answers are "NO", then may proceed with Cephalosporin use.    Tomato Rash    Immunization History  Administered Date(s) Administered   Influenza Split 04/28/2013   Influenza,inj,Quad PF,6+  Mos 06/09/2015, 04/23/2017, 03/23/2019, 05/31/2020, 06/09/2021, 04/24/2022   Influenza-Unspecified 05/12/2014, 04/28/2016, 05/01/2018   Moderna Sars-Covid-2 Vaccination 09/08/2019, 10/08/2019   Pneumococcal Polysaccharide-23 03/03/2014   Zoster Recombinat (Shingrix) 05/18/2021, 07/31/2022    Past Medical History:  Diagnosis Date   Anxiety    Aortic atherosclerosis (HCC) 09/19/2020   Seen on CAT scan 2021   Arthritis    Depression    Diabetes mellitus    Diabetes mellitus, type II (HCC)    Dyspnea    Dysrhythmia    GERD (gastroesophageal reflux disease)    Heart murmur    HTN (hypertension)    Hyperglycemia    IBS (irritable bowel syndrome)    Lung cancer (HCC)    Lung nodules    right, followed by PCP, PET 11/2011   Mental retardation    MI (myocardial infarction) (HCC)    Migraines    NAUSEA AND VOMITING 06/09/2010   Pneumonia    Sleep apnea    cpap    Stroke (HCC)     Tobacco History: Social History   Tobacco Use  Smoking Status Every Day   Packs/day: 0.25   Years: 40.00   Additional pack years: 0.00   Total pack years: 10.00   Types: Cigarettes   Passive exposure: Current  Smokeless Tobacco Never   Ready to quit: Not Answered Counseling given: Not Answered   Outpatient Medications Prior to Visit  Medication Sig Dispense Refill   albuterol (PROVENTIL) (2.5 MG/3ML) 0.083% nebulizer solution Take 3 mLs (2.5 mg total) by nebulization every 6 (six) hours as needed for wheezing or shortness of breath. 75 mL 12   atorvastatin (LIPITOR) 10 MG tablet TAKE (1) TABLET BY MOUTH ONCE DAILY. 30 tablet 2   benztropine (COGENTIN) 1 MG tablet Take 1 tablet (1 mg total) by mouth daily. 30 tablet 5   cycloSPORINE (RESTASIS) 0.05 % ophthalmic emulsion Place 1 drop into both eyes 2 (two) times daily.     dicyclomine (BENTYL) 10 MG capsule Take one capsule before breakfast and one capsule before supper. 60 capsule 10   famotidine (PEPCID) 20 MG tablet Take 1 tablet (20 mg  total) by mouth 2 (two) times daily. 60 tablet 5   GOODSENSE HEMORRHOIDAL 0.25-88.44 % suppository UNWRAP AND PLACE 1 SUPPOSITORY RECTALLY 2 TIMES DAILY AS NEEDED FOR HEMORRHOIDS/ANAL ITCH 12 suppository 10   hydrocortisone (ANUSOL-HC) 25 MG suppository Place 25 mg rectally 2 (two) times daily.     lamoTRIgine (LAMICTAL) 100 MG tablet TAKE 1 TABLET BY MOUTH TWICE A DAY. 60 tablet 10   NONFORMULARY OR COMPOUNDED ITEM Hemorrhoid cream     paliperidone (INVEGA) 9 MG 24 hr tablet TAKE 1 TABLET BY MOUTH ONCE A DAY. 30 tablet 10   potassium chloride (KLOR-CON) 10 MEQ tablet TAKE 2 TABLETS ( ) BY MOUTH TWICE DAILY WITH FOOD 120 tablet 5   PREPARATION H 0.25 % SUPP UNWRAP AND PLACE 1 SUPPOSITORY RECTALLY 2 TIMES DAILY AS NEEDED FOR HEMORRHOIDS/ANAL ITCH 12 suppository 0   propranolol (INDERAL) 10 MG tablet TAKE 1 TABLET BY MOUTH THREE TIMES A DAY. 90 tablet 3   senna (SENOKOT) 8.6  MG TABS tablet Take 1 tablet (8.6 mg total) by mouth at bedtime. 30 tablet 10   sertraline (ZOLOFT) 100 MG tablet Take 100 mg by mouth daily.     torsemide (DEMADEX) 20 MG tablet TAKE (1) TABLET BY MOUTH EACH MORNING. 30 tablet 5   traZODone (DESYREL) 100 MG tablet TAKE 2 TABLETS(200MG ) BY MOUTH AT BEDTIME. 60 tablet 5   TRELEGY ELLIPTA 100-62.5-25 MCG/ACT AEPB INHALE (1) PUFF INTO THE LUNGS ONCE DAILY. 60 each 5   RABEprazole (ACIPHEX) 20 MG tablet TAKE 1 TABLET BY MOUTH TWICE A DAY. (Patient taking differently: Take 20 mg by mouth in the morning and at bedtime.) 180 tablet 3   No facility-administered medications prior to visit.     Review of Systems:   Constitutional: No weight loss or gain, night sweats, fevers, chills, fatigue, or lassitude. HEENT: No headaches, difficulty swallowing, tooth/dental problems, or sore throat. No sneezing, itching, ear ache, nasal congestion, or post nasal drip CV:  No chest pain, orthopnea, PND, swelling in lower extremities, anasarca, dizziness, palpitations, syncope Resp:  +shortness of breath with exertion (stable); chronic productive cough. No excess mucus or change in color of mucus.No hemoptysis. No wheezing.  No chest wall deformity GI:  +heartburn, indigestion, nausea, chronic diarrhea (followed by GI).  No abdominal pain, change in bowel habits, loss of appetite, bloody stools.  Skin: No rash, lesions, ulcerations Neuro: No dizziness or lightheadedness.  Psych: No depression or anxiety. Mood stable.     Physical Exam:  BP 114/72   Pulse 83   Temp 98.8 F (37.1 C) (Oral)   Ht 5' 10.5" (1.791 m)   Wt 238 lb 12.8 oz (108.3 kg)   SpO2 96%   BMI 33.78 kg/m   GEN: Pleasant, interactive, well-appearing; obese; in no acute distress. HEENT:  Normocephalic and atraumatic. PERRLA. Sclera white. Nasal turbinates pink, moist and patent bilaterally. No rhinorrhea present. Oropharynx pink and moist, without exudate or edema. No lesions, ulcerations, or postnasal drip.  NECK:  Supple w/ fair ROM. No JVD present.  CV: RRR, no m/r/g, no peripheral edema. Pulses intact, +2 bilaterally. No cyanosis, pallor or clubbing. PULMONARY:  Unlabored, regular breathing. Clear bilaterally A&P w/o wheezes/rales/rhonchi. No accessory muscle use. No dullness to percussion. GI: BS present and normoactive. Soft, non-tender to palpation.  MSK: No erythema, warmth or tenderness. Cap refil <2 sec all extrem. No deformities or joint swelling noted.  Neuro: A/Ox3. No focal deficits noted.   Skin: Warm, no lesions or rashe Psych: Normal affect and behavior. Judgement and thought content appropriate.     Lab Results:  CBC    Component Value Date/Time   WBC 4.3 09/01/2022 2325   RBC 4.17 09/01/2022 2325   HGB 13.0 09/01/2022 2325   HGB 12.8 03/03/2021 1101   HCT 39.1 09/01/2022 2325   HCT 37.9 03/03/2021 1101   PLT 143 (L) 09/01/2022 2325   PLT 184 03/03/2021 1101   MCV 93.8 09/01/2022 2325   MCV 84 03/03/2021 1101   MCH 31.2 09/01/2022 2325   MCHC 33.2 09/01/2022 2325    RDW 13.8 09/01/2022 2325   RDW 14.6 03/03/2021 1101   LYMPHSABS 1.6 09/01/2022 2325   LYMPHSABS 1.6 03/03/2021 1101   MONOABS 0.3 09/01/2022 2325   EOSABS 0.0 09/01/2022 2325   EOSABS 0.0 03/03/2021 1101   BASOSABS 0.0 09/01/2022 2325   BASOSABS 0.0 03/03/2021 1101    BMET    Component Value Date/Time   NA 131 (L) 09/01/2022 2325   NA  140 06/28/2022 1132   K 3.4 (L) 09/01/2022 2325   CL 95 (L) 09/01/2022 2325   CO2 26 09/01/2022 2325   GLUCOSE 104 (H) 09/01/2022 2325   BUN 7 09/01/2022 2325   BUN 10 06/28/2022 1132   CREATININE 0.72 09/01/2022 2325   CREATININE 0.84 05/01/2013 1044   CALCIUM 8.4 (L) 09/01/2022 2325   GFRNONAA >60 09/01/2022 2325   GFRAA 92 07/16/2020 1111    BNP No results found for: "BNP"   Imaging:  No results found.       Latest Ref Rng & Units 05/18/2022   11:27 AM  PFT Results  FVC-Pre L 1.65   FVC-Predicted Pre % 44   FVC-Post L 1.48   FVC-Predicted Post % 39   Pre FEV1/FVC % % 74   Post FEV1/FCV % % 70   FEV1-Pre L 1.22   FEV1-Predicted Pre % 42   FEV1-Post L 1.04   DLCO uncorrected ml/min/mmHg 14.75   DLCO UNC% % 65   DLCO corrected ml/min/mmHg 14.53   DLCO COR %Predicted % 64   DLVA Predicted % 110   TLC L 4.59   TLC % Predicted % 83   RV % Predicted % 128     No results found for: "NITRICOXIDE"      Assessment & Plan:   COPD, severe (HCC) Severe COPD. Compensated on current regimen. Reviewed PFTs. Smoking cessation strongly advised. Understands risks of worsening lung function with continued smoking. Action plan in place.  Patient Instructions  Continue Trelegy 1 puff daily. Brush tongue and rinse mouth afterwards Continue Albuterol inhaler 2 puffs every 6 hours as needed for shortness of breath or wheezing. Notify if symptoms persist despite rescue inhaler/neb use. Continue Pepcid (famotidine) 20 mg prior to breakfast every morning and then 30 minutes before bedtime.    Try to decrease by 1 cigarette a week until  you have quit. Goal quit date: July 1st  Attend CT in July once scheduled with radiation oncology    Follow up in 4 months with Dr. Tonia Brooms. If symptoms do not improve or worsen, please contact office for sooner follow up or seek emergency care.   Primary malignant neoplasm of right middle lobe of lung (HCC) S/p SBRT. No evidence of recurrence January 2024. Plan for repeat CT imaging July 2024. Follow up with radiation oncology as scheduled.   Tobacco abuse Continued smoking. Cessation advised. Would not recommend wellbutrin or Chantix due to psychiatric history. She has not had any improvement with nicotine patches in the past.   The patient's current tobacco use: 1/4 ppd The patient was advised to quit and impact of smoking on their health.  I assessed the patient's willingness to attempt to quit. I provided methods and skills for cessation. We reviewed medication management of smoking session drugs if appropriate. Resources to help quit smoking were provided. A smoking cessation quit date was set: 01/08/2023 Follow-up was arranged in our clinic.  The amount of time spent counseling patient was 4 mins      I spent 32 minutes of dedicated to the care of this patient on the date of this encounter to include pre-visit review of records, face-to-face time with the patient discussing conditions above, post visit ordering of testing, clinical documentation with the electronic health record, making appropriate referrals as documented, and communicating necessary findings to members of the patients care team.  Noemi Chapel, NP 11/22/2022  Pt aware and understands NP's role.

## 2022-11-24 ENCOUNTER — Encounter: Payer: Self-pay | Admitting: Adult Health

## 2022-11-24 ENCOUNTER — Ambulatory Visit (INDEPENDENT_AMBULATORY_CARE_PROVIDER_SITE_OTHER): Payer: 59 | Admitting: Adult Health

## 2022-11-24 ENCOUNTER — Inpatient Hospital Stay (HOSPITAL_COMMUNITY): Admission: RE | Admit: 2022-11-24 | Payer: 59 | Source: Ambulatory Visit

## 2022-11-24 VITALS — BP 123/84 | HR 73 | Ht 70.5 in | Wt 240.5 lb

## 2022-11-24 DIAGNOSIS — N644 Mastodynia: Secondary | ICD-10-CM | POA: Diagnosis not present

## 2022-11-24 NOTE — Progress Notes (Signed)
  Subjective:     Patient ID: [name], female   DOB: 1961/03/20, 62 y.o.   MRN: [id number]  HPI [name] is a 62 year old black female, divorced, sp hysterectomy in for recheck on her breast, she had fallen in April and hit door jam and it was hitting. Has not gotten mammogram yet, it was rescheduled for 12/13/22. She is using walker but says she has fallen again.  She has DSS worker with her.   PCP is [employer].   Review of Systems Left breast still sore. Reviewed past medical,surgical, social and family history. Reviewed medications and allergies.     Objective:   Physical Exam BP 123/84 (BP Location: Left Arm, Patient Position: Sitting, Cuff Size: Large)   Pulse 73   Ht 5' 10.5" (1.791 m)   Wt 240 lb 8 oz (109.1 kg)   BMI 34.02 kg/m      Skin warm and dry,  Breasts:no dominate palpable mass, retraction or nipple discharge. But left breast tender on exam she says.   [facility] - 11/24/22 1027       Pregnancy Intention Screening   Does the patient want to become pregnant in the next year? N/A    Does the patient's partner want to become pregnant in the next year? N/A    Would the patient like to discuss contraceptive options today? N/A      Contraception Wrap Up   Current Method Female Sterilization   hyst   End Method Female Sterilization   hyst   Contraception Counseling Provided No             Assessment:     1. Breast tenderness Left breast tender still, has fall in April Keep mammogram appt 12/13/22.     Plan:     Follow up prn

## 2022-11-27 ENCOUNTER — Ambulatory Visit (HOSPITAL_COMMUNITY): Payer: 59

## 2022-12-01 NOTE — Telephone Encounter (Signed)
We will discuss this when she comes in

## 2022-12-01 NOTE — Telephone Encounter (Signed)
I have been unable to reach IT sales professional by phone and have left several messages

## 2022-12-06 ENCOUNTER — Ambulatory Visit: Payer: Medicare Other | Admitting: Family Medicine

## 2022-12-13 ENCOUNTER — Ambulatory Visit (HOSPITAL_COMMUNITY): Payer: 59

## 2022-12-14 ENCOUNTER — Other Ambulatory Visit: Payer: Self-pay | Admitting: Gastroenterology

## 2022-12-14 ENCOUNTER — Other Ambulatory Visit: Payer: Self-pay | Admitting: Nurse Practitioner

## 2022-12-15 ENCOUNTER — Ambulatory Visit: Payer: 59 | Admitting: Podiatry

## 2023-01-08 ENCOUNTER — Other Ambulatory Visit: Payer: Self-pay | Admitting: Family Medicine

## 2023-01-08 ENCOUNTER — Other Ambulatory Visit: Payer: Self-pay | Admitting: Nurse Practitioner

## 2023-01-08 DIAGNOSIS — K219 Gastro-esophageal reflux disease without esophagitis: Secondary | ICD-10-CM

## 2023-01-17 ENCOUNTER — Ambulatory Visit (HOSPITAL_COMMUNITY): Payer: 59

## 2023-01-25 ENCOUNTER — Ambulatory Visit (HOSPITAL_COMMUNITY): Payer: 59

## 2023-01-26 ENCOUNTER — Ambulatory Visit: Payer: 59 | Admitting: Podiatry

## 2023-02-09 ENCOUNTER — Telehealth: Payer: Self-pay | Admitting: *Deleted

## 2023-02-09 ENCOUNTER — Other Ambulatory Visit (HOSPITAL_COMMUNITY): Payer: Self-pay | Admitting: Radiation Oncology

## 2023-02-09 DIAGNOSIS — R918 Other nonspecific abnormal finding of lung field: Secondary | ICD-10-CM

## 2023-02-09 NOTE — Telephone Encounter (Signed)
CALLED PATIENT TO INFORM THAT [facility] IS BOOKED UP FOR CT,  I INFORMED HER THAT RADIOLOGY IS TELLING ME SEPTEMBER 16 FOR THE CT AND THEN I WOULD ARRANGE A TELEPHONE FU FOR HER WITH  ALISON PERKINS, LVM FOR A RETURN CALL

## 2023-02-12 ENCOUNTER — Ambulatory Visit: Payer: 59 | Admitting: Radiation Oncology

## 2023-02-13 ENCOUNTER — Other Ambulatory Visit: Payer: Self-pay | Admitting: Family Medicine

## 2023-02-16 ENCOUNTER — Telehealth: Payer: Self-pay | Admitting: Family Medicine

## 2023-02-16 NOTE — Telephone Encounter (Signed)
Patient is due for standard office follow-up visit this fall you may need to connect with her social worker because patient has mental developmental challenges which would make it hard for her to schedule her an appointment

## 2023-02-22 ENCOUNTER — Ambulatory Visit (HOSPITAL_COMMUNITY): Payer: 59

## 2023-02-22 ENCOUNTER — Other Ambulatory Visit (HOSPITAL_COMMUNITY): Payer: 59

## 2023-02-25 ENCOUNTER — Telehealth: Payer: Self-pay | Admitting: Family Medicine

## 2023-02-25 NOTE — Telephone Encounter (Signed)
Front staff Please work with her social worker to make sure that she has with me sometime this fall between now and early December  She has an appointment for annual wellness visit but that is with the staff  Thank you

## 2023-03-05 ENCOUNTER — Other Ambulatory Visit: Payer: Self-pay | Admitting: Family Medicine

## 2023-03-05 ENCOUNTER — Other Ambulatory Visit (HOSPITAL_COMMUNITY): Payer: Self-pay | Admitting: Psychiatry

## 2023-03-05 DIAGNOSIS — E785 Hyperlipidemia, unspecified: Secondary | ICD-10-CM

## 2023-03-20 ENCOUNTER — Encounter (HOSPITAL_COMMUNITY): Payer: Self-pay | Admitting: Psychiatry

## 2023-03-20 ENCOUNTER — Telehealth (INDEPENDENT_AMBULATORY_CARE_PROVIDER_SITE_OTHER): Payer: 59 | Admitting: Psychiatry

## 2023-03-20 DIAGNOSIS — F251 Schizoaffective disorder, depressive type: Secondary | ICD-10-CM | POA: Diagnosis not present

## 2023-03-20 MED ORDER — TRAZODONE HCL 100 MG PO TABS: ORAL_TABLET | ORAL | 5 refills | Status: DC

## 2023-03-20 MED ORDER — BENZTROPINE MESYLATE 1 MG PO TABS: 1.0000 mg | ORAL_TABLET | Freq: Every day | ORAL | 5 refills | Status: DC

## 2023-03-20 MED ORDER — PALIPERIDONE ER 9 MG PO TB24: 9.0000 mg | ORAL_TABLET | Freq: Every day | ORAL | 5 refills | Status: DC

## 2023-03-20 MED ORDER — SERTRALINE HCL 100 MG PO TABS: 100.0000 mg | ORAL_TABLET | Freq: Every day | ORAL | 5 refills | Status: DC

## 2023-03-20 MED ORDER — LAMOTRIGINE 100 MG PO TABS: 100.0000 mg | ORAL_TABLET | Freq: Two times a day (BID) | ORAL | 5 refills | Status: DC

## 2023-03-20 NOTE — Progress Notes (Signed)
Virtual Visit via Telephone Note  I connected with [name] on 03/20/23 at 11:00 AM EDT by telephone and verified that I am speaking with the correct person using two identifiers.  Location: Patient: home Provider: office   I discussed the limitations, risks, security and privacy concerns of performing an evaluation and management service by telephone and the availability of in person appointments. I also discussed with the patient that there may be a patient responsible charge related to this service. The patient expressed understanding and agreed to proceed.      I discussed the assessment and treatment plan with the patient. The patient was provided an opportunity to ask questions and all were answered. The patient agreed with the plan and demonstrated an understanding of the instructions.   The patient was advised to call back or seek an in-person evaluation if the symptoms worsen or if the condition fails to improve as anticipated.  I provided 15 minutes of non-face-to-face time during this encounter.   Diannia Ruder, MD  [facility] MD/PA/NP OP Progress Note  03/20/2023 11:10 AM [name]  MRN:  [id number]  Chief Complaint:  Chief Complaint  Patient presents with   Anxiety   Depression   Follow-up   Schizophrenia   HPI: This patient is a 62 year old divorced white female who lives alone in [place].  She is on disability.  The patient returns for follow-up by phone after about 12 months.  She is by herself and not with her Child psychotherapist.  She states that she is not allowed to go out to appointments right now because she has bedbugs and her social worker cannot drive her until the bedbugs are cleared up.  She states she is going to have an exterminator come in this week.  Consequently she has not seen her primary doctor in quite some time.  As usual she is complaining about pain in her back and legs.  She denies significant depression thoughts of self-harm or suicide or  auditory or visual hallucinations.  She claims she does not sleep well but is difficult to tell.  She is compliant with her medications.  Her thoughts are generally well-organized.  She claims she has some twitches in her arms and legs but without seeing her it is difficult to know. Visit Diagnosis:    ICD-10-CM   1. Schizoaffective disorder, depressive type (HCC)  F25.1       Past Psychiatric History: Hospitalization her younger years, most recently outpatient treatment  Past Medical History:  Past Medical History:  Diagnosis Date   Anxiety    Aortic atherosclerosis (HCC) 09/19/2020   Seen on CAT scan 2021   Arthritis    Depression    Diabetes mellitus    Diabetes mellitus, type II (HCC)    Dyspnea    Dysrhythmia    GERD (gastroesophageal reflux disease)    Heart murmur    HTN (hypertension)    Hyperglycemia    IBS (irritable bowel syndrome)    Lung cancer (HCC)    Lung nodules    right, followed by PCP, PET 11/2011   Mental retardation    MI (myocardial infarction) (HCC)    Migraines    NAUSEA AND VOMITING 06/09/2010   Pneumonia    Sleep apnea    cpap    Stroke [facility])     Past Surgical History:  Procedure Laterality Date   ABDOMINAL HYSTERECTOMY     BIOPSY  01/15/2020   Procedure: BIOPSY;  Surgeon: [name]  M, MD;  [facility];  Service: Endoscopy;;  gastric   BIOPSY  01/25/2022   Procedure: BIOPSY;  Surgeon: Corbin Ade, MD;  [facility];  Service: Endoscopy;;  ascending;descending;sigmoid;   BRONCHIAL BIOPSY  06/14/2021   Procedure: BRONCHIAL BIOPSIES;  Surgeon: Josephine Igo, DO;  [facility];  Service: Pulmonary;;   BRONCHIAL BRUSHINGS  06/14/2021   Procedure: BRONCHIAL BRUSHINGS;  Surgeon: Josephine Igo, DO;  [facility];  Service: Pulmonary;;   BRONCHIAL NEEDLE ASPIRATION BIOPSY  06/14/2021   Procedure: BRONCHIAL NEEDLE ASPIRATION BIOPSIES;  Surgeon: Josephine Igo, DO;  [facility];   Service: Pulmonary;;   COLONOSCOPY  11/2007   hyperplastic polyps, prior hx of adenomas    COLONOSCOPY  05/2010   incomplete due to poor prep, hyperplastic rectal polyp   COLONOSCOPY  05/05/2002   Dimunitive polyps in the rectum and left colon, cold    biopsied/removed.  Scattered few left-sided diverticula.  Regular colonic   mucosa appeared normal   COLONOSCOPY N/A 05/28/2013   Rourk: mulitple tubular adenomas removed. next tcs 05/2016   COLONOSCOPY WITH PROPOFOL N/A 09/07/2016   Dr. Jena Gauss: For hyperplastic polyps removed. Next colonoscopy March 2023 given history of adenomatous colon polyps in the past.   COLONOSCOPY WITH PROPOFOL N/A 01/25/2022   Redundant/elongated colon, melanosis coli, left-sided diverticula, multiple colon polyps, colonic lipoma. Negative active colitis, adenomas. 3 year surveillance.   ESOPHAGOGASTRODUODENOSCOPY  08/2007   moderate sized hiatal hernia   ESOPHAGOGASTRODUODENOSCOPY  05/2010   noncritical schatzki ring s/p 56F   ESOPHAGOGASTRODUODENOSCOPY (EGD) WITH ESOPHAGEAL DILATION N/A 02/06/2013   XBJ:YNWGNF esophagus-s/p dilation up to a 58 [place] size with [facility] dilators.  Hiatal hernia   ESOPHAGOGASTRODUODENOSCOPY (EGD) WITH PROPOFOL N/A 09/07/2016   Dr. Jena Gauss: Normal, status post empiric dilation of the esophagus for history of dysphagia   ESOPHAGOGASTRODUODENOSCOPY (EGD) WITH PROPOFOL N/A 05/02/2019   Normal esophagus s/p dilation, normal stomach, normal duodenum   ESOPHAGOGASTRODUODENOSCOPY (EGD) WITH PROPOFOL N/A 01/15/2020   normal esophagus s/p dilation, gastric nodule s/p biopsy. This showed reactive gastropathy with H.pylori.    EXTERNAL EAR SURGERY     bilateral   FIDUCIAL MARKER PLACEMENT  06/14/2021   Procedure: FIDUCIAL MARKER PLACEMENT;  Surgeon: Josephine Igo, DO;  [facility];  Service: Pulmonary;;   FOOT SURGERY     GLAUCOMA SURGERY     KNEE ARTHROPLASTY Right 11/04/2020   Procedure: COMPUTER ASSISTED TOTAL KNEE  ARTHROPLASTY;  Surgeon: Samson Frederic, MD;  Location: [facility];  Service: Orthopedics;  Laterality: Right;   MALONEY DILATION N/A 09/07/2016   Procedure: Elease Hashimoto DILATION;  Surgeon: Corbin Ade, MD;  [facility];  Service: Endoscopy;  Laterality: N/A;   MALONEY DILATION N/A 05/02/2019   Procedure: Elease Hashimoto DILATION;  Surgeon: Corbin Ade, MD;  [facility];  Service: Endoscopy;  Laterality: N/A;   MALONEY DILATION N/A 01/15/2020   Procedure: Elease Hashimoto DILATION;  Surgeon: Corbin Ade, MD;  [facility];  Service: Endoscopy;  Laterality: N/A;   POLYPECTOMY  09/07/2016   Procedure: POLYPECTOMY;  Surgeon: Corbin Ade, MD;  [facility];  Service: Endoscopy;;  sigmoid colon x4   POLYPECTOMY  01/25/2022   Procedure: POLYPECTOMY;  Surgeon: Corbin Ade, MD;  [facility];  Service: Endoscopy;;  hepatic flexure and descending/sigmoid colon   small bowel capsule  10/2007   normal   VIDEO BRONCHOSCOPY WITH RADIAL ENDOBRONCHIAL ULTRASOUND  06/14/2021  Procedure: VIDEO BRONCHOSCOPY WITH RADIAL ENDOBRONCHIAL ULTRASOUND;  Surgeon: Josephine Igo, DO;  [facility];  Service: Pulmonary;;    Family Psychiatric History: See below  Family History:  Family History  Problem Relation Age of Onset   Stroke Mother    Heart attack Father    Sleep apnea Father    Depression Sister    Schizophrenia Sister    Schizophrenia Other    Drug abuse Other    Alcohol abuse Other    Colon cancer Other        aunt   Obesity Other    COPD Other    Anxiety disorder Other    GER disease Other    Diabetes type II Other    Anxiety disorder Other    Depression Other    Liver disease Neg Hx    Inflammatory bowel disease Neg Hx     Social History:  Social History   Socioeconomic History   Marital status: Divorced    Spouse name: Not on file   Number of children: Not on file   Years of education: Not on file   Highest education  level: High school graduate  Occupational History   Occupation: disabled    Employer: UNEMPLOYED  Tobacco Use   Smoking status: Every Day    Current packs/day: 0.25    Average packs/day: 0.3 packs/day for 40.0 years (10.0 ttl pk-yrs)    Types: Cigarettes    Passive exposure: Current   Smokeless tobacco: Never  Vaping Use   Vaping status: Never Used  Substance and Sexual Activity   Alcohol use: Yes    Comment: occ   Drug use: No   Sexual activity: Yes    Partners: Male    Birth control/protection: Surgical    Comment: hyst  Other Topics Concern   Not on file  Social History Narrative   Not on file   Social Determinants of Health   Financial Resource Strain: Low Risk  (11/03/2022)   Overall Financial Resource Strain (CARDIA)    Difficulty of Paying Living Expenses: Not hard at all  Food Insecurity: No Food Insecurity (11/03/2022)   Hunger Vital Sign    Worried About Running Out of Food in the Last Year: Never true    Ran Out of Food in the Last Year: Never true  Transportation Needs: No Transportation Needs (11/03/2022)   PRAPARE - Administrator, Civil Service (Medical): No    Lack of Transportation (Non-Medical): No  Physical Activity: Inactive (11/03/2022)   Exercise Vital Sign    Days of Exercise per Week: 0 days    Minutes of Exercise per Session: 10 min  Stress: Stress Concern Present (11/03/2022)   [employer] of Occupational Health - Occupational Stress Questionnaire    Feeling of Stress : Very much  Social Connections: Moderately Isolated (11/03/2022)   Social Connection and Isolation Panel [NHANES]    Frequency of Communication with Friends and Family: More than three times a week    Frequency of Social Gatherings with Friends and Family: Three times a week    Attends Religious Services: 1 to 4 times per year    Active Member of Clubs or Organizations: No    Attends Banker Meetings: Never    Marital Status: Separated     Allergies:  Allergies  Allergen Reactions   Thorazine [Chlorpromazine Hcl] Anaphylaxis   Acetaminophen Other (See Comments)    Makes pt dizzy   Aspirin Other (See Comments)  seizure   Aspirin-Acetaminophen-Caffeine Other (See Comments)    seizure   Naproxen Nausea And Vomiting   Nsaids Nausea And Vomiting   Other     Acidic foods   Penicillins Nausea And Vomiting    Has patient had a PCN reaction causing immediate rash, facial/tongue/throat swelling, SOB or lightheadedness with hypotension:Yes Has patient had a PCN reaction causing severe rash involving mucus membranes or skin necrosis:Yes Has patient had a PCN reaction that required hospitalization:Yes Has patient had a PCN reaction occurring within the last 10 years:No If all of the above answers are "NO", then may proceed with Cephalosporin use.    Tomato Rash    Metabolic Disorder Labs: Lab Results  Component Value Date   HGBA1C 6.4 (H) 06/28/2022   MPG 126 10/25/2020   No results found for: "PROLACTIN" Lab Results  Component Value Date   CHOL 132 06/28/2022   TRIG 77 06/28/2022   HDL 69 06/28/2022   CHOLHDL 1.9 06/28/2022   VLDL 21 07/20/2014   LDLCALC 48 06/28/2022   LDLCALC 50 09/08/2021   Lab Results  Component Value Date   TSH 4.492 03/22/2019   TSH 3.180 02/28/2018    Therapeutic Level Labs: No results found for: "LITHIUM" No results found for: "VALPROATE" No results found for: "CBMZ"  Current Medications: Current Outpatient Medications  Medication Sig Dispense Refill   albuterol (PROVENTIL) (2.5 MG/3ML) 0.083% nebulizer solution Take 3 mLs (2.5 mg total) by nebulization every 6 (six) hours as needed for wheezing or shortness of breath. 75 mL 12   atorvastatin (LIPITOR) 10 MG tablet TAKE (1) TABLET BY MOUTH ONCE DAILY. 30 tablet 5   benztropine (COGENTIN) 1 MG tablet Take 1 tablet (1 mg total) by mouth daily. 30 tablet 5   cycloSPORINE (RESTASIS) 0.05 % ophthalmic emulsion Place 1 drop into  both eyes 2 (two) times daily.     dicyclomine (BENTYL) 10 MG capsule Take one capsule before breakfast and one capsule before supper. 60 capsule 10   famotidine (PEPCID) 20 MG tablet TAKE (1) TABLET BY MOUTH TWICE DAILY. 60 tablet 3   Fluticasone-Umeclidin-Vilant (TRELEGY ELLIPTA) 100-62.5-25 MCG/ACT AEPB INHALE (1) PUFF INTO THE LUNGS ONCE DAILY. 60 each 5   GOODSENSE HEMORRHOIDAL 0.25-88.44 % suppository UNWRAP AND PLACE 1 SUPPOSITORY RECTALLY 2 TIMES DAILY AS NEEDED FOR HEMORRHOIDS/ANAL ITCH 12 suppository 0   hydrocortisone (ANUSOL-HC) 25 MG suppository Place 25 mg rectally 2 (two) times daily.     lamoTRIgine (LAMICTAL) 100 MG tablet Take 1 tablet (100 mg total) by mouth 2 (two) times daily. 60 tablet 5   NONFORMULARY OR COMPOUNDED ITEM Hemorrhoid cream     paliperidone (INVEGA) 9 MG 24 hr tablet Take 1 tablet (9 mg total) by mouth daily. 30 tablet 5   potassium chloride (KLOR-CON) 10 MEQ tablet TAKE 2 TABLETS ( ) BY MOUTH TWICE DAILY WITH FOOD 120 tablet 5   PREPARATION H 0.25 % SUPP UNWRAP AND PLACE 1 SUPPOSITORY RECTALLY 2 TIMES DAILY AS NEEDED FOR HEMORRHOIDS/ANAL ITCH 12 suppository 0   propranolol (INDERAL) 10 MG tablet TAKE (1) TABLET BY MOUTH THREE TIMES DAILY. 90 tablet 2   RABEprazole (ACIPHEX) 20 MG tablet TAKE (1) TABLET BY MOUTH TWICE DAILY. 60 tablet 5   senna (SENOKOT) 8.6 MG TABS tablet Take 1 tablet (8.6 mg total) by mouth at bedtime. 30 tablet 10   sertraline (ZOLOFT) 100 MG tablet Take 1 tablet (100 mg total) by mouth daily. 30 tablet 5   torsemide (DEMADEX) 20 MG tablet TAKE (  1) TABLET BY MOUTH EACH MORNING. 30 tablet 5   traZODone (DESYREL) 100 MG tablet TAKE 2 TABLETS(200MG ) BY MOUTH AT BEDTIME. 60 tablet 5   No current facility-administered medications for this visit.     Musculoskeletal: Strength & Muscle Tone: na Gait & Station: na Patient leans: N/A  Psychiatric Specialty Exam: Review of Systems  Musculoskeletal:  Positive for arthralgias and myalgias.   All other systems reviewed and are negative.   There were no vitals taken for this visit.There is no height or weight on file to calculate BMI.  General Appearance: NA  Eye Contact:  NA  Speech:  Clear and Coherent  Volume:  Normal  Mood:  Euthymic  Affect:  NA  Thought Process:  Goal Directed  Orientation:  Full (Time, Place, and Person)  Thought Content: Rumination   Suicidal Thoughts:  No  Homicidal Thoughts:  No  Memory:  Immediate;   Fair Recent;   Fair Remote;   NA  Judgement:  Poor  Insight:  Lacking  Psychomotor Activity:  NA  Concentration:  Concentration: Fair and Attention Span: Fair  Recall:  [employer] of Knowledge: Fair  Language: Good  Akathisia:  No  Handed:  Right  AIMS (if indicated): not done  Assets:  Communication Skills Desire for Improvement Resilience Social Support  ADL's:  Intact  Cognition: Impaired,  Mild  Sleep:  Fair   Screenings: GAD-7    Garment/textile technologist Visit from 11/03/2022 in [facility] for [facility] at [facility] Office Visit from 09/15/2022 in [facility] Office Visit from 06/28/2022 in [facility] Office Visit from 04/24/2022 in [facility] Office Visit from 03/03/2021 in [facility]  Total GAD-7 Score 19 18 17 21 21       PHQ2-9    Flowsheet Row Office Visit from 11[facility]03/2022 in [facility] Office Visit from 09/15/2022 in [facility] Office Visit from 06/28/2022 in [facility] Care Coordination from 05/10/2022 in [facility] Care Coordination Clinical Support from 05/02/2022 in [facility]  PHQ-2 Total Score 6 6 6 1 1   PHQ-9 Total Score 20 17 16  -- 3      Flowsheet Row ED from 09/01/2022 in [facility] at [facility] ED from 04/07/2022 in [facility] [place] [facility] from 03/23/2022 in [facility] Department at [facility]  C-SSRS RISK CATEGORY No Risk No Risk No Risk        Assessment and Plan: This patient is a 62 year old female with a history of schizoaffective disorder cognitive impairment.  It is difficult to tell over the phone exactly how she is doing but she seems to be stable without significant side effects.  She will continue Zoloft 100 mg daily for depression, Invega 9 mg daily for schizophrenic symptoms, Lamictal 100 mg twice daily for mood stabilization, Cogentin 1 mg to prevent side effects from Invega and trazodone 200 mg at bedtime for sleep.  She will return to see me in 6 months  Collaboration of Care: Collaboration of Care: Primary Care Provider AEB notes are shared with PCP on the epic system  Patient/Guardian was advised Release of Information must be obtained prior to any record release in order to collaborate their care with an outside provider. Patient/Guardian was advised if they have not already done so  to contact the registration department to sign all necessary forms in order for [place] to release information regarding their care.   Consent: Patient/Guardian gives verbal consent for treatment and assignment of benefits for services provided during this visit. Patient/Guardian expressed understanding and agreed to proceed.    Diannia Ruder, MD 03/20/2023, 11:10 AM

## 2023-03-23 ENCOUNTER — Other Ambulatory Visit: Payer: Self-pay | Admitting: Family Medicine

## 2023-03-26 ENCOUNTER — Telehealth: Payer: Self-pay

## 2023-03-26 NOTE — Telephone Encounter (Signed)
Called patient left message for patient to call office. (Nurse Note* GOODSENSE HEMORRHOIDAL 0.25-88.44 % suppository need to know if patient is requesting refill or if this is automatic pharmacy request)

## 2023-03-29 ENCOUNTER — Telehealth: Payer: Self-pay

## 2023-03-29 NOTE — Telephone Encounter (Signed)
Please see previous note from 916 May have refill of the suppository if it is covered by her insurance Use twice daily prn  If there is not already a prescription for this utilize twice daily as needed, #8, 2 refills

## 2023-03-29 NOTE — Telephone Encounter (Signed)
Pts social worker called back regarding phone call on 03/26/2023. [name] (Child psychotherapist) stated the pt was requesting the suppository not the pharmacy. Please advise

## 2023-04-02 NOTE — Patient Instructions (Signed)
Visit Information  Thank you for taking time to visit with me today. Please don't hesitate to contact me if I can be of assistance to you.   Following are the goals we discussed today:   Goals Addressed               This Visit's Progress     Patient Stated     COMPLETED: improve worsenting respiratory symptoms (THN) (pt-stated)        Merge duplicate goal      COMPLETED: maintain control of blood pressure at home [facility]) (pt-stated)        Merge duplicate goal      Other     manage chronic back pain, hemorrhoidal pain and attend GI visits- hypertension, breathing symptoms (RN CM)   Not on track     Interventions Today    Flowsheet Row Most Recent Value  Chronic Disease   Chronic disease during today's visit Other  [constipation, hemorrhoids- pain]  General Interventions   General Interventions Discussed/Reviewed General Interventions Reviewed, Sick Day Rules, Doctor Visits, Communication with  Doctor Visits Discussed/Reviewed Specialist  PCP/Specialist Visits Compliance with follow-up visit  [encouaged compliance with pcp and GI MD office visits]  Communication with PCP/Specialists  Exercise Interventions   Exercise Discussed/Reviewed Exercise Reviewed, Physical Activity  Physical Activity Discussed/Reviewed Types of exercise  [confirmed she is active to help prevent/decrease IBS constipation/diarrhea symptoms. She reports she walks, drinks plenty of fluids and eats vegetables. Reviewed GI & pcp appointments. Notice patient with cancelled appointments]  Education Interventions   Education Provided Provided Education  Provided Verbal Education On Medication, Sick Day Rules                Our next appointment is by telephone on 04/12/23 at 1100  Please call the care guide team at [phone] if you need to cancel or reschedule your appointment.   If you are experiencing a Mental Health or Behavioral Health Crisis or need someone to talk to, please call the Suicide  and Crisis Lifeline: 988 call the [place] National Suicide Prevention Lifeline: 843-878-3644 or TTY: 385-284-7862 TTY 254-212-6291) to talk to a trained counselor call 1-800-273-TALK (toll free, 24 hour hotline) call the [facility]: (864) 730-1679 call 911   Patient verbalizes understanding of instructions and care plan provided today and agrees to view in MyChart. Active MyChart status and patient understanding of how to access instructions and care plan via MyChart confirmed with patient.     The patient has been provided with contact information for the care management team and has been advised to call with any health related questions or concerns.   Belita Warsame L. Noelle Penner, RN, BSN, CCM, Care Management Coordinator [phone]

## 2023-04-03 ENCOUNTER — Other Ambulatory Visit: Payer: Self-pay | Admitting: Family Medicine

## 2023-04-09 ENCOUNTER — Telehealth: Payer: Self-pay | Admitting: *Deleted

## 2023-04-09 NOTE — Telephone Encounter (Signed)
Called patient's sponsor  [name] [name]) to ask about rescheduling missed CT, lvm for a return call

## 2023-04-11 ENCOUNTER — Telehealth (INDEPENDENT_AMBULATORY_CARE_PROVIDER_SITE_OTHER): Payer: 59 | Admitting: Family Medicine

## 2023-04-11 DIAGNOSIS — C342 Malignant neoplasm of middle lobe, bronchus or lung: Secondary | ICD-10-CM

## 2023-04-11 DIAGNOSIS — Z720 Tobacco use: Secondary | ICD-10-CM

## 2023-04-11 DIAGNOSIS — F17210 Nicotine dependence, cigarettes, uncomplicated: Secondary | ICD-10-CM

## 2023-04-11 DIAGNOSIS — F251 Schizoaffective disorder, depressive type: Secondary | ICD-10-CM

## 2023-04-11 NOTE — Progress Notes (Signed)
Subjective:    Patient ID: [name], female    DOB: 06/27/1961, 62 y.o.   MRN: [id number]  HPI This started off as a virtual visit with video within the video failed and we had to shift over to telephone Virtual Visit via Video Note  I connected with [name] on 04/11/23 at  1:10 PM EDT by a video enabled telemedicine application and verified that I am speaking with the correct person using two identifiers.  Location: Patient: Home/apartment Provider: Office   I discussed the limitations of evaluation and management by telemedicine and the availability of in person appointments. The patient expressed understanding and agreed to proceed.  History of Present Illness: Patient has challenges she has social service challenges where she does not always do a great job of making good decisions In addition to this she has some coughing and congestion history of presumed lung cancer as well as still smokes What complicated things is recently she has had bedbugs and there are no transportation services willing to transport her.  She stays in an apartment out of the building but does not do a good job in regards to self-care and prepping her meals etc. she does not have a aide who helps her   Observations/Objective:   Assessment and Plan:   Follow Up Instructions:    I discussed the assessment and treatment plan with the patient. The patient was provided an opportunity to ask questions and all were answered. The patient agreed with the plan and demonstrated an understanding of the instructions.   The patient was advised to call back or seek an in-person evaluation if the symptoms worsen or if the condition fails to improve as anticipated.  I provided 15 minutes of non-face-to-face time during this encounter.   [name], MD    Review of Systems     Objective:   Physical Exam Patient had virtual visit-video Appears to be in no distress Atraumatic Neuro able to relate  and oriented No apparent resp distress Color normal        Assessment & Plan:  Virtual visit History of bedbugs History lung cancer Still smokes Morbid obesity We did discuss the importance of healthy eating also the importance of quitting smoking we will try to get her an appointment to follow-up with pulmonary or at least find out when they want to have CAT scan She would be better off if she was in a assisted living or some sort of group home she does not do well on her own and in my opinion I believe it would serve her best to be under the care and help of competent care Her social worker will be working on this

## 2023-04-12 ENCOUNTER — Ambulatory Visit: Payer: Self-pay | Admitting: *Deleted

## 2023-04-12 NOTE — Patient Outreach (Signed)
  Care Coordination   Follow Up Visit Note   04/12/2023 Name: [name] MRN: [id number] DOB: 1961-01-05  [name] is a 62 y.o. year old female who sees Luking, Jonna Coup, MD for primary care. I spoke with [name] on behalf of [name] by phone today.  What matters to the patients health and wellness today?  Concern for her safety, not making good decisions per [name] has spoken with pcp via video visit on 04/11/23 [name] states patient is not answering her phone although it is beside her [name] states she does not think the patient is depressed as she is animated and interacts but making bad decisions Not wanting to participate    Goals Addressed             This Visit's Progress    manage mental health (RN CM)       Interventions Today    Flowsheet Row Most Recent Value  Chronic Disease   Chronic disease during today's visit Other  [making bad decisions per [name]]  General Interventions   General Interventions Discussed/Reviewed --  [[name] states the patient is okay regarding chronic back pain, hemorrhoidal pain and attend GI visits- hypertension, breathing symptoms at this time]  Doctor Visits Discussed/Reviewed Doctor Visits Reviewed, PCP  Communication with PCP/Specialists  Mental Health Interventions   Mental Health Discussed/Reviewed Mental Health Discussed, Coping Strategies, Depression  [offered Social worker services but pending Copy with pcp again]  Nutrition Interventions   Nutrition Discussed/Reviewed Nutrition Discussed  [denies changes in appetite]  Safety Interventions   Safety Discussed/Reviewed Safety Reviewed, Home Safety  Home Safety --  [offer to refer to Social worker pending pcp follow up per [name]]                SDOH assessments and interventions completed:  No     Care Coordination Interventions:  Yes, provided   Follow up plan: No further intervention required.   Encounter Outcome:  Patient  Visit Completed   Denise Bradford L. Noelle Penner, RN, BSN, CCM, Care Management Coordinator [phone]

## 2023-04-12 NOTE — Patient Instructions (Signed)
 Visit Information  Thank you for taking time to visit with me today. Please don't hesitate to contact me if I can be of assistance to you.   Following are the goals we discussed today:   Goals Addressed             This Visit's Progress    manage mental health (RN CM)       Interventions Today    Flowsheet Row Most Recent Value  Chronic Disease   Chronic disease during today's visit Other  [making bad decisions per [name]]  General Interventions   General Interventions Discussed/Reviewed --  [[name] states the patient is okay regarding chronic back pain, hemorrhoidal pain and attend GI visits- hypertension, breathing symptoms at this time]  Doctor Visits Discussed/Reviewed Doctor Visits Reviewed, PCP  Communication with PCP/Specialists  Mental Health Interventions   Mental Health Discussed/Reviewed Mental Health Discussed, Coping Strategies, Depression  [offered Social worker services but pending Copy with pcp again]  Nutrition Interventions   Nutrition Discussed/Reviewed Nutrition Discussed  [denies changes in appetite]  Safety Interventions   Safety Discussed/Reviewed Safety Reviewed, Home Safety  Home Safety --  [offer to refer to Social worker pending pcp follow up per [name]]                Our next appointment is no further scheduled appointments.  on n/a at n/a  Please call the care guide team at [phone] if you need to cancel or reschedule your appointment.   If you are experiencing a Mental Health or Behavioral Health Crisis or need someone to talk to, please call the Suicide and Crisis Lifeline: 988 call the [place] National Suicide Prevention Lifeline: 832-045-9080 or TTY: 3142212184 TTY (417)081-0243) to talk to a trained counselor call 1-800-273-TALK (toll free, 24 hour hotline) call the [facility]: 919-291-1600 call 911   Patient verbalizes understanding of instructions and care plan provided today and agrees  to view in MyChart. Active MyChart status and patient understanding of how to access instructions and care plan via MyChart confirmed with patient.     The patient has been provided with contact information for the care management team and has been advised to call with any health related questions or concerns.   Julisa Flippo L. Noelle Penner, RN, BSN, CCM, Care Management Coordinator [phone]

## 2023-04-17 ENCOUNTER — Telehealth: Payer: Self-pay | Admitting: *Deleted

## 2023-04-17 NOTE — Telephone Encounter (Signed)
CALLED PATIENT'S SPONSOR, [name] [name] TO ASK ABOUT RESCHEDULING CT AND TELEPHONE FU, LVM FOR A RETURN CALL

## 2023-04-23 ENCOUNTER — Telehealth: Payer: Self-pay | Admitting: Nurse Practitioner

## 2023-04-23 NOTE — Telephone Encounter (Signed)
Called to speak with patient to schedule an appointment. Spoke with Child psychotherapist. She states that patient has really bad bed bugs and wont be able to come into office. Recall has been placed as a reminder for patient to come in.

## 2023-04-24 ENCOUNTER — Telehealth: Payer: Self-pay | Admitting: Family Medicine

## 2023-04-24 NOTE — Telephone Encounter (Signed)
[name] wanted you to know patient passed mental evaluation test. [name] wasn't able to send email swelling of the ankles she call with information.

## 2023-05-09 NOTE — Telephone Encounter (Signed)
I did talk with [name], this patient does not meet the criteria for incompetency per Dr. Tenny Craw when we see the patient we will encourage her to make good choices

## 2023-05-24 ENCOUNTER — Other Ambulatory Visit: Payer: Self-pay | Admitting: Gastroenterology

## 2023-05-24 ENCOUNTER — Other Ambulatory Visit: Payer: Self-pay | Admitting: Family Medicine

## 2023-05-24 DIAGNOSIS — K219 Gastro-esophageal reflux disease without esophagitis: Secondary | ICD-10-CM

## 2023-05-25 ENCOUNTER — Telehealth: Payer: Self-pay

## 2023-05-25 NOTE — Telephone Encounter (Signed)
Patient dropped off document  _ , to be filled out by provider. Patient requested to send it back via Call Patient to pick up within 7-days. Document is located in providers tray at front office.Please advise at Mobile [phone] (mobile)  This for is [facility] last appt withj Video Visit on 10//24

## 2023-05-27 ENCOUNTER — Telehealth: Payer: Self-pay | Admitting: Family Medicine

## 2023-05-27 NOTE — Telephone Encounter (Signed)
Form was sent to my office [place] 2 was filled out If [name] needs something else please let us know Sometimes assisted living's can get somewhat wary of taking on a patient who has mental health issues but we can provide a letter stating that she is stable and under the care of Dr. Tenny Craw for her mental health if it would be helpful Otherwise please attach a list of her medications to the [facility] form and at that point it would be ready for [name] her social worker thank you

## 2023-05-28 NOTE — Telephone Encounter (Signed)
Form was filled out thank you it will need long-term medication list printed out to go with the form

## 2023-06-04 NOTE — Telephone Encounter (Addendum)
   Medication list printed and placed upfront for faxing

## 2023-06-04 NOTE — Telephone Encounter (Signed)
Medication list printed and placed up front for faxing .

## 2023-06-06 ENCOUNTER — Telehealth: Payer: Self-pay | Admitting: *Deleted

## 2023-06-06 NOTE — Telephone Encounter (Signed)
CALLED PATIENT TO INFORM THAT NONE OF THOSE DATES THAT SHE GAVE ME WILL WORK, LVM FOR A RETURN CALL

## 2023-06-09 ENCOUNTER — Other Ambulatory Visit: Payer: Self-pay | Admitting: Radiation Oncology

## 2023-06-09 DIAGNOSIS — C342 Malignant neoplasm of middle lobe, bronchus or lung: Secondary | ICD-10-CM

## 2023-06-19 ENCOUNTER — Telehealth: Payer: Self-pay | Admitting: Radiation Oncology

## 2023-06-19 ENCOUNTER — Telehealth: Payer: Self-pay | Admitting: *Deleted

## 2023-06-19 NOTE — Telephone Encounter (Signed)
12/10 @ 11:15 am Patient's representative [name] called to requested CT scans orders to be re-put back in due to patient is unable to go to have scans done until 07/2023.  Secure chat sent to [facility], so they are aware.

## 2023-06-19 NOTE — Telephone Encounter (Signed)
CALLED PATIENT TO INFORM OF CT FOR 07-23-23- ARRIVAL TIME- 11:45 AM @ [facility], NO RESTRICTIONS TO SCAN, PATIENT TO BE CALLED ON 08-06-23 @ 1 PM WITH RESULTS BY ALISON PERKINS, SPOKE WITH CARE GIVE [name] [name] AND SHE IS AWARE OF THESE APPTS. AND THE INSTRUCTIONS

## 2023-07-20 ENCOUNTER — Telehealth: Payer: Self-pay | Admitting: Family Medicine

## 2023-07-20 ENCOUNTER — Ambulatory Visit (INDEPENDENT_AMBULATORY_CARE_PROVIDER_SITE_OTHER): Payer: 59

## 2023-07-20 VITALS — BP 178/90 | Ht 70.5 in | Wt 230.0 lb

## 2023-07-20 DIAGNOSIS — H9191 Unspecified hearing loss, right ear: Secondary | ICD-10-CM

## 2023-07-20 DIAGNOSIS — Z0001 Encounter for general adult medical examination with abnormal findings: Secondary | ICD-10-CM

## 2023-07-20 DIAGNOSIS — E119 Type 2 diabetes mellitus without complications: Secondary | ICD-10-CM | POA: Diagnosis not present

## 2023-07-20 DIAGNOSIS — H9193 Unspecified hearing loss, bilateral: Secondary | ICD-10-CM

## 2023-07-20 DIAGNOSIS — Z0000 Encounter for general adult medical examination without abnormal findings: Secondary | ICD-10-CM

## 2023-07-20 DIAGNOSIS — Z1231 Encounter for screening mammogram for malignant neoplasm of breast: Secondary | ICD-10-CM

## 2023-07-20 NOTE — Telephone Encounter (Signed)
 Patient is overdue for lab work and office visit She faces social challenges Please connect with her social worker Please have them do lab work Also schedule follow-up office visit with myself  Lab work-lipid, liver, metabolic 7, A1c, urine ACR  Type 2 diabetes, hyperlipidemia, high risk medication

## 2023-07-20 NOTE — Patient Instructions (Signed)
 [name] , Thank you for taking time to come for your Medicare Wellness Visit. I appreciate your ongoing commitment to your health goals. Please review the following plan we discussed and let me know if I can assist you in the future.   Referrals/Orders/Follow-Ups/Clinician Recommendations:   Next Medicare Annual Wellness Visit:July 25, 2024 at 10:40 am telephone visit  You have an order for:  []   2D Mammogram  []   3D Mammogram  [x]   Bone Density   []   Lung Cancer Screening  Please call for appointment:   [facility] [address] [place], [place] [address] [phone]  Make sure to wear two-piece clothing.  No lotions powders or deodorants the day of the appointment Make sure to bring picture ID and insurance card.  Bring list of medications you are currently taking including any supplements.   Schedule your Brenda screening mammogram through MyChart!   Log into your MyChart account.  Go to 'Visit' (or 'Appointments' if on mobile App) --> Schedule an Appointment  Under 'Select a Reason for Visit' choose the Mammogram Screening option.  Complete the pre-visit questions and select the time and place that best fits your schedule.    This is a list of the screening recommended for you and due dates:  Health Maintenance  Topic Date Due   DTaP/Tdap/Td vaccine (1 - Tdap) Never done   Pneumococcal Vaccination (2 of 2 - PCV) 03/04/2015   COVID-19 Vaccine (3 - Moderna risk series) 11/05/2019   Mammogram  01/27/2022   Complete foot exam   06/13/2022   Yearly kidney health urinalysis for diabetes  09/09/2022   Hemoglobin A1C  12/28/2022   Flu Shot  10/08/2023*   Eye exam for diabetics  08/18/2023   Yearly kidney function blood test for diabetes  09/02/2023   Medicare Annual Wellness Visit  07/19/2024   Colon Cancer Screening  01/25/2025   Hepatitis C Screening  Completed   HIV Screening  Completed   Zoster (Shingles) Vaccine   Completed   HPV Vaccine  Aged Out  *Topic was postponed. The date shown is not the original due date.    Advanced directives: (Declined) Advance directive discussed with you today. Even though you declined this today, please call our office should you change your mind, and we can give you the proper paperwork for you to fill out.  Next Medicare Annual Wellness Visit scheduled for next year: yes  Preventive Care 66-67 Years Old, Female Preventive care refers to lifestyle choices and visits with your health care provider that can promote health and wellness. Preventive care visits are also called wellness exams. What can I expect for my preventive care visit? Counseling Your health care provider may ask you questions about your: Medical history, including: Past medical problems. Family medical history. Pregnancy history. Current health, including: Menstrual cycle. Method of birth control. Emotional well-being. Home life and relationship well-being. Sexual activity and sexual health. Lifestyle, including: Alcohol , nicotine or tobacco, and drug use. Access to firearms. Diet, exercise, and sleep habits. Work and work astronomer. Sunscreen use. Safety issues such as seatbelt and bike helmet use. Physical exam Your health care provider will check your: Height and weight. These may be used to calculate your BMI (body mass index). BMI is a measurement that tells if you are at a healthy weight. Waist circumference. This measures the distance around your waistline. This measurement also tells if you are at a healthy weight and may help  predict your risk of certain diseases, such as type 2 diabetes and high blood pressure. Heart rate and blood pressure. Body temperature. Skin for abnormal spots. What immunizations do I need?  Vaccines are usually given at various ages, according to a schedule. Your health care provider will recommend vaccines for you based on your age, medical history,  and lifestyle or other factors, such as travel or where you work. What tests do I need? Screening Your health care provider may recommend screening tests for certain conditions. This may include: Lipid and cholesterol levels. Diabetes screening. This is done by checking your blood sugar (glucose) after you have not eaten for a while (fasting). Pelvic exam and Pap test. Hepatitis B test. Hepatitis C test. HIV (human immunodeficiency virus) test. STI (sexually transmitted infection) testing, if you are at risk. Lung cancer screening. Colorectal cancer screening. Mammogram. Talk with your health care provider about when you should start having regular mammograms. This may depend on whether you have a family history of breast cancer. BRCA-related cancer screening. This may be done if you have a family history of breast, ovarian, tubal, or peritoneal cancers. Bone density scan. This is done to screen for osteoporosis. Talk with your health care provider about your test results, treatment options, and if necessary, the need for more tests. Follow these instructions at home: Eating and drinking  Eat a diet that includes fresh fruits and vegetables, whole grains, lean protein, and low-fat dairy products. Take vitamin and mineral supplements as recommended by your health care provider. Do not drink alcohol  if: Your health care provider tells you not to drink. You are pregnant, may be pregnant, or are planning to become pregnant. If you drink alcohol : Limit how much you have to 0-1 drink a day. Know how much alcohol  is in your drink. In the [place] one drink equals one 12 oz bottle of beer (355 mL), one 5 oz glass of wine (148 mL), or one 1 oz glass of hard liquor (44 mL). Lifestyle Brush your teeth every morning and night with fluoride toothpaste. Floss one time each day. Exercise for at least 30 minutes 5 or more days each week. Do not use any products that contain nicotine or tobacco. These  products include cigarettes, chewing tobacco, and vaping devices, such as e-cigarettes. If you need help quitting, ask your health care provider. Do not use drugs. If you are sexually active, practice safe sex. Use a condom or other form of protection to prevent STIs. If you do not wish to become pregnant, use a form of birth control. If you plan to become pregnant, see your health care provider for a prepregnancy visit. Take aspirin only as told by your health care provider. Make sure that you understand how much to take and what form to take. Work with your health care provider to find out whether it is safe and beneficial for you to take aspirin daily. Find healthy ways to manage stress, such as: Meditation, yoga, or listening to music. Journaling. Talking to a trusted person. Spending time with friends and family. Minimize exposure to UV radiation to reduce your risk of skin cancer. Safety Always wear your seat belt while driving or riding in a vehicle. Do not drive: If you have been drinking alcohol . Do not ride with someone who has been drinking. When you are tired or distracted. While texting. If you have been using any mind-altering substances or drugs. Wear a helmet and other protective equipment during sports activities. If you  have firearms in your house, make sure you follow all gun safety procedures. Seek help if you have been physically or sexually abused. What's next? Visit your health care provider once a year for an annual wellness visit. Ask your health care provider how often you should have your eyes and teeth checked. Stay up to date on all vaccines. This information is not intended to replace advice given to you by your health care provider. Make sure you discuss any questions you have with your health care provider. Document Revised: 12/22/2020 Document Reviewed: 12/22/2020 Elsevier Patient Education  2024 [employer].   Understanding Your Risk for  Falls Millions of people have serious injuries from falls each year. It is important to understand your risk of falling. Talk with your health care provider about your risk and what you can do to lower it. If you do have a serious fall, make sure to tell your provider. Falling once raises your risk of falling again. How can falls affect me? Serious injuries from falls are common. These include: Broken bones, such as hip fractures. Head injuries, such as traumatic brain injuries (TBI) or concussions. A fear of falling can cause you to avoid activities and stay at home. This can make your muscles weaker and raise your risk for a fall. What can increase my risk? There are a number of risk factors that increase your risk for falling. The more risk factors you have, the higher your risk of falling. Serious injuries from a fall happen most often to people who are older than 61 years old. Teenagers and young adults ages 69-29 are also at higher risk. Common risk factors include: Weakness in the lower body. Being generally weak or confused due to long-term (chronic) illness. Dizziness or balance problems. Poor vision. Medicines that cause dizziness or drowsiness. These may include: Medicines for your blood pressure, heart, anxiety, insomnia, or swelling (edema). Pain medicines. Muscle relaxants. Other risk factors include: Drinking alcohol . Having had a fall in the past. Having foot pain or wearing improper footwear. Working at a dangerous job. Having any of the following in your home: Tripping hazards, such as floor clutter or loose rugs. Poor lighting. Pets. Having dementia or memory loss. What actions can I take to lower my risk of falling?     Physical activity Stay physically fit. Do strength and balance exercises. Consider taking a regular class to build strength and balance. Yoga and tai chi are good options. Vision Have your eyes checked every year and your prescription for  glasses or contacts updated as needed. Shoes and walking aids Wear non-skid shoes. Wear shoes that have rubber soles and low heels. Do not wear high heels. Do not walk around the house in socks or slippers. Use a cane or walker as told by your provider. Home safety Attach secure railings on both sides of your stairs. Install grab bars for your bathtub, shower, and toilet. Use a non-skid mat in your bathtub or shower. Attach bath mats securely with double-sided, non-slip rug tape. Use good lighting in all rooms. Keep a flashlight near your bed. Make sure there is a clear path from your bed to the bathroom. Use night-lights. Do not use throw rugs. Make sure all carpeting is taped or tacked down securely. Remove all clutter from walkways and stairways, including extension cords. Repair uneven or broken steps and floors. Avoid walking on icy or slippery surfaces. Walk on the grass instead of on icy or slick sidewalks. Use ice melter to  get rid of ice on walkways in the winter. Use a cordless phone. Questions to ask your health care provider Can you help me check my risk for a fall? Do any of my medicines make me more likely to fall? Should I take a vitamin D supplement? What exercises can I do to improve my strength and balance? Should I make an appointment to have my vision checked? Do I need a bone density test to check for weak bones (osteoporosis)? Would it help to use a cane or a walker? Where to find more information Centers for Disease Control and Prevention, STEAD[employer]: tonerpromos.no Community-Based Fall Prevention Programs: tonerpromos.no [employer] on Aging: baseringtones.pl Contact a health care provider if: You fall at home. You are afraid of falling at home. You feel weak, drowsy, or dizzy. This information is not intended to replace advice given to you by your health care provider. Make sure you discuss any questions you have with your health care provider. Document Revised: 02/27/2022  Document Reviewed: 02/27/2022 Elsevier Patient Education  2024 [employer].

## 2023-07-20 NOTE — Progress Notes (Signed)
 Because this visit was a virtual/telehealth visit,  certain criteria was not obtained, such a blood pressure, CBG if applicable, and timed get up and go. Any medications not marked as taking were not mentioned during the medication reconciliation part of the visit. Any vitals not documented were not able to be obtained due to this being a telehealth visit or patient was unable to self-report a recent blood pressure reading due to a lack of equipment at home via telehealth. Vitals that have been documented are verbally provided by the patient.  Interactive audio and video telecommunications were attempted between this provider and patient, however failed, due to patient having technical difficulties OR patient did not have access to video capability.  We continued and completed visit with audio only.  Visit completed with the help of patient's social worker Delon w/ DSS [facility] Subjective:   [name] is a 63 y.o. female who presents for Medicare Annual (Subsequent) preventive examination.  Visit Complete: Virtual I connected with  [name] on 07/20/23 by a audio enabled telemedicine application and verified that I am speaking with the correct person using two identifiers.  Patient Location: Home  Provider Location: Home Office  I discussed the limitations of evaluation and management by telemedicine. The patient expressed understanding and agreed to proceed.  Vital Signs: Because this visit was a virtual/telehealth visit, some criteria may be missing or patient reported. Any vitals not documented were not able to be obtained and vitals that have been documented are patient reported.  Patient Medicare AWV questionnaire was completed by the patient on na; I have confirmed that all information answered by patient is correct and no changes since this date.  Cardiac Risk Factors include: advanced age (>49men, >53 women);diabetes mellitus;dyslipidemia;hypertension;obesity (BMI  >30kg/m2);sedentary lifestyle;smoking/ tobacco exposure     Objective:    Today's Vitals   07/20/23 1032 07/20/23 1035  BP: (!) 178/90   Weight: 230 lb (104.3 kg)   Height: 5' 10.5 (1.791 m)   PainSc:  10-Worst pain ever   Body mass index is 32.54 kg/m.     07/20/2023   10:31 AM 09/01/2022    9:14 PM 05/10/2022   10:50 AM 05/02/2022    1:46 PM 03/23/2022    1:16 PM 01/23/2022    9:13 AM 01/16/2022    1:59 PM  Advanced Directives  Does Patient Have a Medical Advance Directive? No No No No No No No  Would patient like information on creating a medical advance directive? No - Patient declined  No - Patient declined No - Patient declined No - Patient declined No - Patient declined     Current Medications (verified) Outpatient Encounter Medications as of 07/20/2023  Medication Sig   albuterol  (PROVENTIL ) (2.5 MG/3ML) 0.083% nebulizer solution Take 3 mLs (2.5 mg total) by nebulization every 6 (six) hours as needed for wheezing or shortness of breath.   atorvastatin  (LIPITOR) 10 MG tablet TAKE (1) TABLET BY MOUTH ONCE DAILY.   benztropine  (COGENTIN ) 1 MG tablet Take 1 tablet (1 mg total) by mouth daily.   cycloSPORINE  (RESTASIS ) 0.05 % ophthalmic emulsion Place 1 drop into both eyes 2 (two) times daily.   dicyclomine  (BENTYL ) 10 MG capsule Take one capsule before breakfast and one capsule before supper.   famotidine  (PEPCID ) 20 MG tablet TAKE (1) TABLET BY MOUTH TWICE DAILY.   Fluticasone -Umeclidin-Vilant (TRELEGY ELLIPTA ) 100-62.5-25 MCG/ACT AEPB INHALE (1) PUFF INTO THE LUNGS ONCE DAILY.   GOODSENSE HEMORRHOIDAL 0.25-88.44 % suppository UNWRAP AND  PLACE 1 SUPPOSITORY RECTALLY 2 TIMES DAILY AS NEEDED FOR HEMORRHOIDS/ANAL ITCH   hydrocortisone  (ANUSOL -HC) 25 MG suppository Place 25 mg rectally 2 (two) times daily.   lamoTRIgine  (LAMICTAL ) 100 MG tablet Take 1 tablet (100 mg total) by mouth 2 (two) times daily.   NONFORMULARY OR COMPOUNDED ITEM Hemorrhoid cream   paliperidone  (INVEGA ) 9  MG 24 hr tablet Take 1 tablet (9 mg total) by mouth daily.   potassium chloride  (KLOR-CON ) 10 MEQ tablet TAKE 2 TABLETS ( ) BY MOUTH TWICE DAILY WITH FOOD   PREPARATION H 0.25 % SUPP UNWRAP AND PLACE 1 SUPPOSITORY RECTALLY 2 TIMES DAILY AS NEEDED FOR HEMORRHOIDS/ANAL ITCH   propranolol  (INDERAL ) 10 MG tablet TAKE (1) TABLET BY MOUTH THREE TIMES DAILY.   RABEprazole  (ACIPHEX ) 20 MG tablet TAKE (1) TABLET BY MOUTH TWICE DAILY.   senna (SENOKOT) 8.6 MG TABS tablet TAKE (1) TABLET BY MOUTH AT BEDTIME.   sertraline  (ZOLOFT ) 100 MG tablet Take 1 tablet (100 mg total) by mouth daily.   torsemide  (DEMADEX ) 20 MG tablet TAKE (1) TABLET BY MOUTH EACH MORNING.   traZODone  (DESYREL ) 100 MG tablet TAKE 2 TABLETS(200MG ) BY MOUTH AT BEDTIME.   No facility-administered encounter medications on file as of 07/20/2023.    Allergies (verified) Thorazine [chlorpromazine hcl], Acetaminophen , Aspirin, Aspirin-acetaminophen -caffeine, Naproxen , Nsaids, Other, Penicillins, and Tomato   History: Past Medical History:  Diagnosis Date   Anxiety    Aortic atherosclerosis (HCC) 09/19/2020   Seen on CAT scan 2021   Arthritis    Depression    Diabetes mellitus    Diabetes mellitus, type II (HCC)    Dyspnea    Dysrhythmia    GERD (gastroesophageal reflux disease)    Heart murmur    HTN (hypertension)    Hyperglycemia    IBS (irritable bowel syndrome)    Lung cancer (HCC)    Lung nodules    right, followed by PCP, PET 11/2011   Mental retardation    MI (myocardial infarction) (HCC)    Migraines    NAUSEA AND VOMITING 06/09/2010   Pneumonia    Sleep apnea    cpap    Stroke [facility])    Past Surgical History:  Procedure Laterality Date   ABDOMINAL HYSTERECTOMY     BIOPSY  01/15/2020   Procedure: BIOPSY;  Surgeon: Shaaron Lamar HERO, MD;  [facility];  Service: Endoscopy;;  gastric   BIOPSY  01/25/2022   Procedure: BIOPSY;  Surgeon: Shaaron Lamar HERO, MD;  [facility];  Service:  Endoscopy;;  ascending;descending;sigmoid;   BRONCHIAL BIOPSY  06/14/2021   Procedure: BRONCHIAL BIOPSIES;  Surgeon: Brenna Adine CROME, DO;  [facility]: [facility];  Service: Pulmonary;;   BRONCHIAL BRUSHINGS  06/14/2021   Procedure: BRONCHIAL BRUSHINGS;  Surgeon: Brenna Adine CROME, DO;  [facility]: [facility];  Service: Pulmonary;;   BRONCHIAL NEEDLE ASPIRATION BIOPSY  06/14/2021   Procedure: BRONCHIAL NEEDLE ASPIRATION BIOPSIES;  Surgeon: Brenna Adine CROME, DO;  [facility]: [facility];  Service: Pulmonary;;   COLONOSCOPY  11/2007   hyperplastic polyps, prior hx of adenomas    COLONOSCOPY  05/2010   incomplete due to poor prep, hyperplastic rectal polyp   COLONOSCOPY  05/05/2002   Dimunitive polyps in the rectum and left colon, cold    biopsied/removed.  Scattered few left-sided diverticula.  Regular colonic   mucosa appeared normal   COLONOSCOPY N/A 05/28/2013   Rourk: mulitple tubular adenomas removed. next tcs 05/2016   COLONOSCOPY WITH PROPOFOL  N/A 09/07/2016   Dr. Shaaron:  For hyperplastic polyps removed. Next colonoscopy March 2023 given history of adenomatous colon polyps in the past.   COLONOSCOPY WITH PROPOFOL  N/A 01/25/2022   Redundant/elongated colon, melanosis coli, left-sided diverticula, multiple colon polyps, colonic lipoma. Negative active colitis, adenomas. 3 year surveillance.   ESOPHAGOGASTRODUODENOSCOPY  08/2007   moderate sized hiatal hernia   ESOPHAGOGASTRODUODENOSCOPY  05/2010   noncritical schatzki ring s/p 15F   ESOPHAGOGASTRODUODENOSCOPY (EGD) WITH ESOPHAGEAL DILATION N/A 02/06/2013   MFM:Wnmfjo esophagus-s/p dilation up to a 58 French size with [facility] dilators.  Hiatal hernia   ESOPHAGOGASTRODUODENOSCOPY (EGD) WITH PROPOFOL  N/A 09/07/2016   Dr. Shaaron: Normal, status post empiric dilation of the esophagus for history of dysphagia   ESOPHAGOGASTRODUODENOSCOPY (EGD) WITH PROPOFOL  N/A 05/02/2019   Normal esophagus s/p dilation, normal stomach, normal duodenum    ESOPHAGOGASTRODUODENOSCOPY (EGD) WITH PROPOFOL  N/A 01/15/2020   normal esophagus s/p dilation, gastric nodule s/p biopsy. This showed reactive gastropathy with H.pylori.    EXTERNAL EAR SURGERY     bilateral   FIDUCIAL MARKER PLACEMENT  06/14/2021   Procedure: FIDUCIAL MARKER PLACEMENT;  Surgeon: Brenna Adine CROME, DO;  [facility];  Service: Pulmonary;;   FOOT SURGERY     GLAUCOMA SURGERY     KNEE ARTHROPLASTY Right 11/04/2020   Procedure: COMPUTER ASSISTED TOTAL KNEE ARTHROPLASTY;  Surgeon: Fidel Rogue, MD;  Location: [facility];  Service: Orthopedics;  Laterality: Right;   MALONEY DILATION N/A 09/07/2016   Procedure: AGAPITO DILATION;  Surgeon: Lamar CHRISTELLA Shaaron, MD;  [facility];  Service: Endoscopy;  Laterality: N/A;   MALONEY DILATION N/A 05/02/2019   Procedure: AGAPITO DILATION;  Surgeon: Shaaron Lamar CHRISTELLA, MD;  [facility];  Service: Endoscopy;  Laterality: N/A;   MALONEY DILATION N/A 01/15/2020   Procedure: AGAPITO DILATION;  Surgeon: Shaaron Lamar CHRISTELLA, MD;  [facility];  Service: Endoscopy;  Laterality: N/A;   POLYPECTOMY  09/07/2016   Procedure: POLYPECTOMY;  Surgeon: Lamar CHRISTELLA Shaaron, MD;  [facility];  Service: Endoscopy;;  sigmoid colon x4   POLYPECTOMY  01/25/2022   Procedure: POLYPECTOMY;  Surgeon: Shaaron Lamar CHRISTELLA, MD;  [facility];  Service: Endoscopy;;  hepatic flexure and descending/sigmoid colon   small bowel capsule  10/2007   normal   VIDEO BRONCHOSCOPY WITH RADIAL ENDOBRONCHIAL ULTRASOUND  06/14/2021   Procedure: VIDEO BRONCHOSCOPY WITH RADIAL ENDOBRONCHIAL ULTRASOUND;  Surgeon: Brenna Adine CROME, DO;  [facility];  Service: Pulmonary;;   Family History  Problem Relation Age of Onset   Stroke Mother    Heart attack Father    Sleep apnea Father    Depression Sister    Schizophrenia Sister    Schizophrenia Other    Drug abuse Other    Alcohol  abuse Other    Colon cancer Other        aunt    Obesity Other    COPD Other    Anxiety disorder Other    GER disease Other    Diabetes type II Other    Anxiety disorder Other    Depression Other    Liver disease Neg Hx    Inflammatory bowel disease Neg Hx    Social History   Socioeconomic History   Marital status: Divorced    Spouse name: Not on file   Number of children: Not on file   Years of education: Not on file   Highest education level: High school graduate  Occupational History   Occupation: disabled    Employer: UNEMPLOYED  Tobacco Use   Smoking status: Every Day    Current packs/day: 0.25    Average packs/day: 0.3 packs/day for 40.0 years (10.0 ttl pk-yrs)    Types: Cigarettes    Passive exposure: Current   Smokeless tobacco: Never  Vaping Use   Vaping status: Never Used  Substance and Sexual Activity   Alcohol  use: Yes    Comment: occ   Drug use: No   Sexual activity: Yes    Partners: Male    Birth control/protection: Surgical    Comment: hyst  Other Topics Concern   Not on file  Social History Narrative   Not on file   Social Drivers of Health   Financial Resource Strain: Low Risk  (07/20/2023)   Overall Financial Resource Strain (CARDIA)    Difficulty of Paying Living Expenses: Not hard at all  Food Insecurity: Food Insecurity Present (07/20/2023)   Hunger Vital Sign    Worried About Running Out of Food in the Last Year: Sometimes true    Ran Out of Food in the Last Year: Sometimes true  Transportation Needs: No Transportation Needs (07/20/2023)   PRAPARE - Administrator, Civil Service (Medical): No    Lack of Transportation (Non-Medical): No  Physical Activity: Patient Declined (07/20/2023)   Exercise Vital Sign    Days of Exercise per Week: Patient declined    Minutes of Exercise per Session: Patient declined  Stress: Stress Concern Present (07/20/2023)   [employer] of Occupational Health - Occupational Stress Questionnaire    Feeling of Stress : Very much  Social  Connections: Socially Isolated (07/20/2023)   Social Connection and Isolation Panel [NHANES]    Frequency of Communication with Friends and Family: Twice a week    Frequency of Social Gatherings with Friends and Family: Never    Attends Religious Services: Never    Database Administrator or Organizations: No    Attends Engineer, Structural: Never    Marital Status: Separated    Tobacco Counseling Ready to quit: Yes Counseling given: Yes   Clinical Intake:  Pre-visit preparation completed: Yes  Pain : 0-10 Pain Score: 10-Worst pain ever Pain Type: Chronic pain Pain Location: Foot Pain Orientation: Right, Left Pain Descriptors / Indicators: Aching Pain Onset: More than a month ago Pain Frequency: Constant     BMI - recorded: 32.54 Nutritional Status: BMI > 30  Obese Nutritional Risks: None Diabetes: Yes CBG done?: No Did pt. bring in CBG monitor from home?: No  How often do you need to have someone help you when you read instructions, pamphlets, or other written materials from your doctor or pharmacy?: 1 - Never  Interpreter Needed?: No  Information entered by :: Stefano ORN CMA   Activities of Daily Living    07/20/2023   10:44 AM  In your present state of health, do you have any difficulty performing the following activities:  Hearing? 1  Comment referral placed for audiology  Vision? 0  Difficulty concentrating or making decisions? 1  Comment per patient sometimes  Walking or climbing stairs? 1  Comment pt has difficulty navigating stairs due to using a walker  Dressing or bathing? 1  Comment per patient sometimes  Doing errands, shopping? 1  Preparing Food and eating ? Y  Comment patients aid prepares food. she has an aid that comes in 6 days a week  Using the Toilet? N  In the past six months, have you accidently leaked urine? N  Do  you have problems with loss of bowel control? N  Managing your Medications? Y  Comment medications are  prepackaged/bubble wrapped  Managing your Finances? Y  Comment someone helps with this  Housekeeping or managing your Housekeeping? Y  Comment aids try to help her    Patient Care Team: Alphonsa Glendia LABOR, MD as PCP - General (Family Medicine) Shaaron Lamar HERO, MD (Gastroenterology) Ramonita Suzen CROME, RN as [facility] Care Management Icard, Adine CROME, DO as Consulting Physician (Pulmonary Disease)  Indicate any recent Medical Services you may have received from other than [facility] providers in the past year (date may be approximate).     Assessment:   This is a routine wellness examination for Chan.  Hearing/Vision screen Hearing Screening - Comments:: Patient having difficulty hearing. Referral placed for audiology today Vision Screening - Comments:: Wears rx glasses - up to date with routine eye exams  Oneil Kawasaki @ [facility] [place]   Goals Addressed             This Visit's Progress    Patient Stated       To get another TV       Depression Screen    07/20/2023   10:47 AM 11/03/2022    9:52 AM 09/15/2022   11:36 AM 06/28/2022   10:56 AM 05/10/2022   10:53 AM 05/02/2022    1:44 PM 04/24/2022    2:30 PM  PHQ 2/9 Scores  PHQ - 2 Score 1 6 6 6 1 1 6   PHQ- 9 Score 8 20 17 16  3 15     Fall Risk    07/20/2023   10:44 AM 11/03/2022   10:24 AM 09/29/2022    9:49 AM 09/15/2022   11:36 AM 06/28/2022   10:56 AM  Fall Risk   Falls in the past year? 1 1 1 1 1   Number falls in past yr: 1 1 1 1 1   Injury with Fall? 1 1 1  0 0  Risk for fall due to : History of fall(s);Impaired balance/gait;Impaired mobility  History of fall(s);Impaired balance/gait;Impaired mobility  History of fall(s)  Follow up Education provided;Falls prevention discussed    Falls evaluation completed    MEDICARE RISK AT HOME: Medicare Risk at Home Any stairs in or around the home?: No If so, are there any without handrails?: No Home free of loose throw rugs in walkways, pet beds,  electrical cords, etc?: Yes Adequate lighting in your home to reduce risk of falls?: Yes Life alert?: Yes Use of a cane, walker or w/c?: Yes Grab bars in the bathroom?: Yes Shower chair or bench in shower?: Yes Elevated toilet seat or a handicapped toilet?: Yes  TIMED UP AND GO:  Was the test performed?  No    Cognitive Function:        07/20/2023   10:36 AM 04/26/2021    1:29 PM  6CIT Screen  What Year? 0 points 0 points  What month? 0 points 0 points  What time? 0 points 0 points  Count back from 20 0 points 0 points  Months in reverse 0 points 0 points  Repeat phrase 0 points 0 points  Total Score 0 points 0 points    Immunizations Immunization History  Administered Date(s) Administered   Influenza Split 04/28/2013   Influenza,inj,Quad PF,6+ Mos 06/09/2015, 04/23/2017, 03/23/2019, 05/31/2020, 06/09/2021, 04/24/2022   Influenza-Unspecified 05/12/2014, 04/28/2016, 05/01/2018   Moderna Sars-Covid-2 Vaccination 09/08/2019, 10/08/2019   Pneumococcal Polysaccharide-23 03/03/2014   Zoster Recombinant(Shingrix)  05/18/2021, 07/31/2022    TDAP status: Due, Education has been provided regarding the importance of this vaccine. Advised may receive this vaccine at local pharmacy or Health Dept. Aware to provide a copy of the vaccination record if obtained from local pharmacy or Health Dept. Verbalized acceptance and understanding.  Flu Vaccine status: Due, Education has been provided regarding the importance of this vaccine. Advised may receive this vaccine at local pharmacy or Health Dept. Aware to provide a copy of the vaccination record if obtained from local pharmacy or Health Dept. Verbalized acceptance and understanding.  Pneumococcal vaccine status: Not age appropriate for this patient.   Covid-19 vaccine status: Information provided on how to obtain vaccines.   Qualifies for Shingles Vaccine? No   Zostavax completed No   Shingrix Completed?: Yes  Screening  Tests Health Maintenance  Topic Date Due   DTaP/Tdap/Td (1 - Tdap) Never done   Pneumococcal Vaccine 31-53 Years old (2 of 2 - PCV) 03/04/2015   COVID-19 Vaccine (3 - Moderna risk series) 11/05/2019   FOOT EXAM  06/13/2022   Diabetic kidney evaluation - Urine ACR  09/09/2022   HEMOGLOBIN A1C  12/28/2022   MAMMOGRAM  01/28/2023   Medicare Annual Wellness (AWV)  05/03/2023   INFLUENZA VACCINE  10/08/2023 (Originally 02/08/2023)   OPHTHALMOLOGY EXAM  08/18/2023   Diabetic kidney evaluation - eGFR measurement  09/02/2023   Colonoscopy  01/26/2032   Hepatitis C Screening  Completed   HIV Screening  Completed   Zoster Vaccines- Shingrix  Completed   HPV VACCINES  Aged Out    Health Maintenance  Health Maintenance Due  Topic Date Due   DTaP/Tdap/Td (1 - Tdap) Never done   Pneumococcal Vaccine 15-69 Years old (2 of 2 - PCV) 03/04/2015   COVID-19 Vaccine (3 - Moderna risk series) 11/05/2019   FOOT EXAM  06/13/2022   Diabetic kidney evaluation - Urine ACR  09/09/2022   HEMOGLOBIN A1C  12/28/2022   MAMMOGRAM  01/28/2023   Medicare Annual Wellness (AWV)  05/03/2023    Colorectal cancer screening: Type of screening: Colonoscopy. Completed 01/25/2022. Repeat every 3 years  Mammogram status: Ordered 07/20/2023. Pt provided with contact info and advised to call to schedule appt.   Bone Density Screening: Not age appropriate for this patient.   Lung Cancer Screening: (Low Dose CT Chest recommended if Age 76-80 years, 20 pack-year currently smoking OR have quit w/in 15years.) does not qualify.   Lung Cancer Screening Referral: na  Additional Screening:  Hepatitis C Screening: does not qualify; Completed   Vision Screening: Recommended annual ophthalmology exams for early detection of glaucoma and other disorders of the eye. Is the patient up to date with their annual eye exam?  Yes  Who is the provider or what is the name of the office in which the patient attends annual eye exams? [facility]/ Dr. Oneil Kawasaki If pt is not established with a provider, would they like to be referred to a provider to establish care? No .   Dental Screening: Recommended annual dental exams for proper oral hygiene  Diabetic Foot Exam: Diabetic Foot Exam: Overdue, Pt has been advised about the importance in completing this exam. Pt is scheduled for diabetic foot exam on provider notified.  Community Resource Referral / Chronic Care Management: CRR required this visit?  No   CCM required this visit?  No     Plan:     I have personally reviewed and noted the following in the patient's  chart:   Medical and social history Use of alcohol , tobacco or illicit drugs  Current medications and supplements including opioid prescriptions. Patient is not currently taking opioid prescriptions. Functional ability and status Nutritional status Physical activity Advanced directives List of other physicians Hospitalizations, surgeries, and ER visits in previous 12 months Vitals Screenings to include cognitive, depression, and falls Referrals and appointments  In addition, I have reviewed and discussed with patient certain preventive protocols, quality metrics, and best practice recommendations. A written personalized care plan for preventive services as well as general preventive health recommendations were provided to patient.     M[place] LABOR Ligia Duguay, CMA   07/20/2023   After Visit Summary: (MyChart) Due to this being a telephonic visit, the after visit summary with patients personalized plan was offered to patient via MyChart

## 2023-07-23 ENCOUNTER — Ambulatory Visit (HOSPITAL_COMMUNITY)
Admission: RE | Admit: 2023-07-23 | Discharge: 2023-07-23 | Disposition: A | Discharge status: Home / Self Care | Payer: 59 | Source: Ambulatory Visit | Attending: Radiation Oncology | Admitting: Radiation Oncology

## 2023-07-23 ENCOUNTER — Other Ambulatory Visit: Payer: Self-pay

## 2023-07-23 DIAGNOSIS — Z79899 Other long term (current) drug therapy: Secondary | ICD-10-CM

## 2023-07-23 DIAGNOSIS — C342 Malignant neoplasm of middle lobe, bronchus or lung: Secondary | ICD-10-CM | POA: Insufficient documentation

## 2023-07-23 DIAGNOSIS — E119 Type 2 diabetes mellitus without complications: Secondary | ICD-10-CM

## 2023-07-23 DIAGNOSIS — E1169 Type 2 diabetes mellitus with other specified complication: Secondary | ICD-10-CM

## 2023-07-23 LAB — POCT I-STAT CREATININE: Creatinine, Ser: 1 mg/dL (ref 0.44–1.00)

## 2023-07-23 MED ORDER — IOHEXOL 300 MG/ML  SOLN
75.0000 mL | Freq: Once | INTRAMUSCULAR | Status: AC | PRN
  Administered 2023-07-23: 75 mL via INTRAVENOUS

## 2023-07-23 NOTE — Telephone Encounter (Signed)
 Spoke with soc worker Ms [name] and informed.

## 2023-07-25 ENCOUNTER — Other Ambulatory Visit: Payer: Self-pay | Admitting: Internal Medicine

## 2023-07-25 ENCOUNTER — Other Ambulatory Visit: Payer: Self-pay | Admitting: Family Medicine

## 2023-07-25 ENCOUNTER — Ambulatory Visit: Payer: 59 | Admitting: Podiatry

## 2023-07-25 ENCOUNTER — Other Ambulatory Visit: Payer: Self-pay | Admitting: Gastroenterology

## 2023-07-25 DIAGNOSIS — K219 Gastro-esophageal reflux disease without esophagitis: Secondary | ICD-10-CM

## 2023-07-31 ENCOUNTER — Other Ambulatory Visit: Payer: Self-pay | Admitting: Gastroenterology

## 2023-08-06 ENCOUNTER — Encounter: Payer: Self-pay | Admitting: Radiation Oncology

## 2023-08-06 ENCOUNTER — Ambulatory Visit
Admission: RE | Admit: 2023-08-06 | Discharge: 2023-08-06 | Disposition: A | Discharge status: Home / Self Care | Payer: 59 | Source: Ambulatory Visit | Attending: Radiation Oncology | Admitting: Radiation Oncology

## 2023-08-06 DIAGNOSIS — C342 Malignant neoplasm of middle lobe, bronchus or lung: Secondary | ICD-10-CM

## 2023-08-06 NOTE — Progress Notes (Signed)
Radiation Oncology         [phone] ________________________________   Outpatient Follow Up - Conducted via telephone at caregiver request.  I spoke with the patient's social worker to conduct this consult visit via telephone. The patient's social worker was notified in advance and was offered an in person or telemedicine meeting to allow for face to face communication but instead preferred to proceed with a telephone visit.  Name: [name]        MRN: [id number]  Date of Service: 08/06/2023 DOB: 05-04-1961  WU:[id number], Jonna Coup, MD  Josephine Igo, DO     REFERRING PHYSICIAN: Josephine Igo, DO   DIAGNOSIS: The encounter diagnosis was Primary malignant neoplasm of right middle lobe of lung (HCC).   HISTORY OF PRESENT ILLNESS: [name] is a 63 y.o. female with a carcinoid tumor in the right lung.  The patient has been followed for many years with what was felt to be a benign nodule in the right lung.  She had a CT chest without contrast on 03/04/2021 that showed a Nodule in the right middle lobe measuring up to 2 cm in close proximity to the pulmonary artery branch.  She underwent a PET scan on 04/01/2021 that showed hypermetabolism with an SUV of 7.1 and this nodule, to rule out AVM in this location, she did also have a CT angiography scan of the chest on 05/26/2021 and there was no post contrast enhancement to suggest vascular anomaly.  She did undergo bronchoscopy on 06/14/2021 cytology from that procedure with Dr.Icard showed neoplastic cells in the fine-needle aspirate consistent with neuroendocrine tumor similar findings were seen in the brushing.  She was offered surgical resection but is interested in less invasive approach.    Rather, she went on to receive stereotactic body radiotherapy (SBRT) which she completed in February 2023.  Since, she has been followed in surveillance. She's had delays in her care due to insect infestation in her home, so her scan was not performed  over the summer. This has been taken care of and she was able to go for a CT chest on 07/23/23. This shows stable post treatment changes in the RML. No new nodules or adenopathy was noted. Her social worker Marcelino Duster was contacted to communicate these results as has been the protocol since she established care.     PREVIOUS RADIATION THERAPY:   08/01/21-08/16/21: The tumor in the RML was treated with a course of stereotactic body radiation treatment. The patient received 50 Gy In 5 fractions at 10 Gy per fraction.   PAST MEDICAL HISTORY:  Past Medical History:  Diagnosis Date   Anxiety    Aortic atherosclerosis (HCC) 09/19/2020   Seen on CAT scan 2021   Arthritis    Depression    Diabetes mellitus    Diabetes mellitus, type II (HCC)    Dyspnea    Dysrhythmia    GERD (gastroesophageal reflux disease)    Heart murmur    HTN (hypertension)    Hyperglycemia    IBS (irritable bowel syndrome)    Lung cancer (HCC)    Lung nodules    right, followed by PCP, PET 11/2011   Mental retardation    MI (myocardial infarction) (HCC)    Migraines    NAUSEA AND VOMITING 06/09/2010   Pneumonia    Sleep apnea    cpap    Stroke [facility])        PAST SURGICAL HISTORY: Past Surgical History:  Procedure Laterality Date   ABDOMINAL HYSTERECTOMY     BIOPSY  01/15/2020   Procedure: BIOPSY;  Surgeon: Corbin Ade, MD;  [facility];  Service: Endoscopy;;  gastric   BIOPSY  01/25/2022   Procedure: BIOPSY;  Surgeon: Corbin Ade, MD;  [facility];  Service: Endoscopy;;  ascending;descending;sigmoid;   BRONCHIAL BIOPSY  06/14/2021   Procedure: BRONCHIAL BIOPSIES;  Surgeon: Josephine Igo, DO;  [facility]: [facility];  Service: Pulmonary;;   BRONCHIAL BRUSHINGS  06/14/2021   Procedure: BRONCHIAL BRUSHINGS;  Surgeon: Josephine Igo, DO;  [facility]: [facility];  Service: Pulmonary;;   BRONCHIAL NEEDLE ASPIRATION BIOPSY  06/14/2021   Procedure: BRONCHIAL NEEDLE ASPIRATION  BIOPSIES;  Surgeon: Josephine Igo, DO;  [facility]: [facility];  Service: Pulmonary;;   COLONOSCOPY  11/2007   hyperplastic polyps, prior hx of adenomas    COLONOSCOPY  05/2010   incomplete due to poor prep, hyperplastic rectal polyp   COLONOSCOPY  05/05/2002   Dimunitive polyps in the rectum and left colon, cold    biopsied/removed.  Scattered few left-sided diverticula.  Regular colonic   mucosa appeared normal   COLONOSCOPY N/A 05/28/2013   Rourk: mulitple tubular adenomas removed. next tcs 05/2016   COLONOSCOPY WITH PROPOFOL N/A 09/07/2016   Dr. Jena Gauss: For hyperplastic polyps removed. Next colonoscopy March 2023 given history of adenomatous colon polyps in the past.   COLONOSCOPY WITH PROPOFOL N/A 01/25/2022   Redundant/elongated colon, melanosis coli, left-sided diverticula, multiple colon polyps, colonic lipoma. Negative active colitis, adenomas. 3 year surveillance.   ESOPHAGOGASTRODUODENOSCOPY  08/2007   moderate sized hiatal hernia   ESOPHAGOGASTRODUODENOSCOPY  05/2010   noncritical schatzki ring s/p 27F   ESOPHAGOGASTRODUODENOSCOPY (EGD) WITH ESOPHAGEAL DILATION N/A 02/06/2013   EAV:WUJWJX esophagus-s/p dilation up to a 58 [place] size with [facility] dilators.  Hiatal hernia   ESOPHAGOGASTRODUODENOSCOPY (EGD) WITH PROPOFOL N/A 09/07/2016   Dr. Jena Gauss: Normal, status post empiric dilation of the esophagus for history of dysphagia   ESOPHAGOGASTRODUODENOSCOPY (EGD) WITH PROPOFOL N/A 05/02/2019   Normal esophagus s/p dilation, normal stomach, normal duodenum   ESOPHAGOGASTRODUODENOSCOPY (EGD) WITH PROPOFOL N/A 01/15/2020   normal esophagus s/p dilation, gastric nodule s/p biopsy. This showed reactive gastropathy with H.pylori.    EXTERNAL EAR SURGERY     bilateral   FIDUCIAL MARKER PLACEMENT  06/14/2021   Procedure: FIDUCIAL MARKER PLACEMENT;  Surgeon: Josephine Igo, DO;  [facility];  Service: Pulmonary;;   FOOT SURGERY     GLAUCOMA SURGERY     KNEE ARTHROPLASTY  Right 11/04/2020   Procedure: COMPUTER ASSISTED TOTAL KNEE ARTHROPLASTY;  Surgeon: Samson Frederic, MD;  Location: [facility];  Service: Orthopedics;  Laterality: Right;   MALONEY DILATION N/A 09/07/2016   Procedure: Elease Hashimoto DILATION;  Surgeon: Corbin Ade, MD;  [facility];  Service: Endoscopy;  Laterality: N/A;   MALONEY DILATION N/A 05/02/2019   Procedure: Elease Hashimoto DILATION;  Surgeon: Corbin Ade, MD;  [facility];  Service: Endoscopy;  Laterality: N/A;   MALONEY DILATION N/A 01/15/2020   Procedure: Elease Hashimoto DILATION;  Surgeon: Corbin Ade, MD;  [facility];  Service: Endoscopy;  Laterality: N/A;   POLYPECTOMY  09/07/2016   Procedure: POLYPECTOMY;  Surgeon: Corbin Ade, MD;  [facility];  Service: Endoscopy;;  sigmoid colon x4   POLYPECTOMY  01/25/2022   Procedure: POLYPECTOMY;  Surgeon: Corbin Ade, MD;  [facility];  Service: Endoscopy;;  hepatic flexure and  descending/sigmoid colon   small bowel capsule  10/2007   normal   VIDEO BRONCHOSCOPY WITH RADIAL ENDOBRONCHIAL ULTRASOUND  06/14/2021   Procedure: VIDEO BRONCHOSCOPY WITH RADIAL ENDOBRONCHIAL ULTRASOUND;  Surgeon: Josephine Igo, DO;  [facility];  Service: Pulmonary;;     FAMILY HISTORY:  Family History  Problem Relation Age of Onset   Stroke Mother    Heart attack Father    Sleep apnea Father    Depression Sister    Schizophrenia Sister    Schizophrenia Other    Drug abuse Other    Alcohol abuse Other    Colon cancer Other        aunt   Obesity Other    COPD Other    Anxiety disorder Other    GER disease Other    Diabetes type II Other    Anxiety disorder Other    Depression Other    Liver disease Neg Hx    Inflammatory bowel disease Neg Hx      SOCIAL HISTORY:  reports that she has been smoking cigarettes. She has a 10 pack-year smoking history. She has been exposed to tobacco smoke. She has never used smokeless tobacco. She  reports current alcohol use. She reports that she does not use drugs.The patient is divorced and lives independently but has a social workers who are responsible for her and are caregivers.  She has an in-home caregiver who visits with her most days.   ALLERGIES: Thorazine [chlorpromazine hcl], Acetaminophen, Aspirin, Aspirin-acetaminophen-caffeine, Naproxen, Nsaids, Other, Penicillins, and Tomato   MEDICATIONS:  Current Outpatient Medications  Medication Sig Dispense Refill   albuterol (PROVENTIL) (2.5 MG/3ML) 0.083% nebulizer solution Take 3 mLs (2.5 mg total) by nebulization every 6 (six) hours as needed for wheezing or shortness of breath. 75 mL 12   atorvastatin (LIPITOR) 10 MG tablet TAKE (1) TABLET BY MOUTH ONCE DAILY. 30 tablet 5   benztropine (COGENTIN) 1 MG tablet Take 1 tablet (1 mg total) by mouth daily. 30 tablet 5   cycloSPORINE (RESTASIS) 0.05 % ophthalmic emulsion Place 1 drop into both eyes 2 (two) times daily.     dicyclomine (BENTYL) 10 MG capsule TAKE 1 CAPSULE BY MOUTH UP TO 3 TIMES DAILY BEFORE MEALS AS NEEDED FOR ABDOMINAL CRAMPS/LOOSE STOOLS. 90 capsule 11   famotidine (PEPCID) 20 MG tablet TAKE (1) TABLET BY MOUTH TWICE DAILY. 60 tablet 11   Fluticasone-Umeclidin-Vilant (TRELEGY ELLIPTA) 100-62.5-25 MCG/ACT AEPB INHALE (1) PUFF INTO THE LUNGS ONCE DAILY. 60 each 5   GOODSENSE HEMORRHOIDAL 0.25-88.44 % suppository UNWRAP AND PLACE 1 SUPPOSITORY RECTALLY 2 TIMES DAILY AS NEEDED FOR HEMORRHOIDS/ANAL ITCH 12 suppository 10   hydrocortisone (ANUSOL-HC) 2.5 % rectal cream PLACE 1 APPLICATION RECTALLY 2 TIMES DAILY. 30 g 0   lamoTRIgine (LAMICTAL) 100 MG tablet Take 1 tablet (100 mg total) by mouth 2 (two) times daily. 60 tablet 5   NONFORMULARY OR COMPOUNDED ITEM Hemorrhoid cream     paliperidone (INVEGA) 9 MG 24 hr tablet Take 1 tablet (9 mg total) by mouth daily. 30 tablet 5   potassium chloride (KLOR-CON) 10 MEQ tablet TAKE 2 TABLETS ( ) BY MOUTH TWICE DAILY WITH FOOD  120 tablet 5   PREPARATION H 0.25 % SUPP UNWRAP AND PLACE 1 SUPPOSITORY RECTALLY 2 TIMES DAILY AS NEEDED FOR HEMORRHOIDS/ANAL ITCH 12 suppository 0   propranolol (INDERAL) 10 MG tablet TAKE (1) TABLET BY MOUTH THREE TIMES DAILY. 90 tablet 11   RABEprazole (ACIPHEX) 20 MG tablet TAKE (1) TABLET BY MOUTH  TWICE DAILY. 60 tablet 11   senna (SENOKOT) 8.6 MG TABS tablet TAKE (1) TABLET BY MOUTH AT BEDTIME. 30 tablet 5   sertraline (ZOLOFT) 100 MG tablet Take 1 tablet (100 mg total) by mouth daily. 30 tablet 5   torsemide (DEMADEX) 20 MG tablet TAKE (1) TABLET BY MOUTH EACH MORNING. 30 tablet 5   traZODone (DESYREL) 100 MG tablet TAKE 2 TABLETS(200MG ) BY MOUTH AT BEDTIME. 60 tablet 5   No current facility-administered medications for this encounter.     REVIEW OF SYSTEMS: On review of systems, the patient's team state she's been doing okay overall. No new symptoms of shortness of breath were noted or reported.  PHYSICAL EXAM:  Unable to assess given encounter type  ECOG = 0  0 - Asymptomatic (Fully active, able to carry on all predisease activities without restriction)  1 - Symptomatic but completely ambulatory (Restricted in physically strenuous activity but ambulatory and able to carry out work of a light or sedentary nature. For example, light housework, office work)  2 - Symptomatic, <50% in bed during the day (Ambulatory and capable of all self care but unable to carry out any work activities. Up and about more than 50% of waking hours)  3 - Symptomatic, >50% in bed, but not bedbound (Capable of only limited self-care, confined to bed or chair 50% or more of waking hours)  4 - Bedbound (Completely disabled. Cannot carry on any self-care. Totally confined to bed or chair)  5 - Death   Santiago Glad MM, Creech RH, Tormey DC, et al. [phone]). "Toxicity and response criteria of the [facility]". Am. Evlyn Clines. Oncol. 5 (6): 649-55    LABORATORY DATA:  Lab Results  Component  Value Date   WBC 4.3 09/01/2022   HGB 13.0 09/01/2022   HCT 39.1 09/01/2022   MCV 93.8 09/01/2022   PLT 143 (L) 09/01/2022   Lab Results  Component Value Date   NA 131 (L) 09/01/2022   K 3.4 (L) 09/01/2022   CL 95 (L) 09/01/2022   CO2 26 09/01/2022   Lab Results  Component Value Date   ALT 16 09/01/2022   AST 21 09/01/2022   ALKPHOS 78 09/01/2022   BILITOT 0.5 09/01/2022      RADIOGRAPHY: CT CHEST W CONTRAST Result Date: 07/31/2023 CLINICAL DATA:  Restaging non-small cell lung cancer. Status post SBRT for neuroendocrine tumor. EXAM: CT CHEST WITH CONTRAST TECHNIQUE: Multidetector CT imaging of the chest was performed during intravenous contrast administration. RADIATION DOSE REDUCTION: This exam was performed according to the departmental dose-optimization program which includes automated exposure control, adjustment of the mA and/or kV according to patient size and/or use of iterative reconstruction technique. CONTRAST:  75mL OMNIPAQUE IOHEXOL 300 MG/ML  SOLN COMPARISON:  Multiple prior imaging studies. The most recent chest CT is 08/09/2022 FINDINGS: Cardiovascular: The heart is normal in size. No pericardial effusion. The aorta is normal in caliber. No dissection. Stable age advanced atherosclerotic calcifications. Stable coronary artery calcifications. Mediastinum/Nodes: No mediastinal or hilar mass or lymphadenopathy. Stable small scattered lymph nodes. The esophagus is unremarkable. The thyroid gland is unremarkable. Lungs/Pleura: Stable post treatment changes in the right middle lobe with significant right middle lobe atelectasis and radiation fibrosis. No findings suspicious for recurrent tumor. Stable fiducials. Underlying emphysematous changes and areas of pulmonary scarring. No new pulmonary lesions or pulmonary nodules. No acute pulmonary process. No pleural effusions or pleural nodules. Upper Abdomen: No significant upper abdominal findings. Musculoskeletal: No breast masses,  supraclavicular  or axillary adenopathy. Stable macro calcifications in the right breast. The bony structures are intact. No bone lesions. IMPRESSION: 1. Stable post treatment changes in the right middle lobe with significant right middle lobe atelectasis and radiation fibrosis. No findings suspicious for recurrent tumor. 2. No mediastinal or hilar mass or adenopathy. 3. No new pulmonary lesions or pulmonary nodules. Aortic Atherosclerosis (ICD10-I70.0) and Emphysema (ICD10-J43.9). Electronically Signed   By: Rudie Meyer M.D.   On: 07/31/2023 17:14       IMPRESSION/PLAN: 1. Carcinoid tumor in the right middle lobe.  I spoke with the patient's social worker today and we discussed the results of her CT scan.  She has done well since her treatment and he remains without concerns for malignancy.  We will continue to follow her current NCCN guidelines for non-small cell lung cancer at 71-month intervals.     This encounter was conducted via telephone.  The patient has provided two factor identification and has given verbal consent for this type of encounter and has been advised to only accept a meeting of this type in a secure network environment. The time spent during this encounter was 35 minutes including preparation, discussion, and coordination of the patient's care. The attendants for this meeting include Ronny Bacon and Marcelino Duster Bullins. During the encounter,  Ronny Bacon was located at [facility] Radiation Oncology Department.  [name] was located at home and not a part of the discussion but her Case Manager Zettie Pho was at work.      Osker Mason, [facility]   **Disclaimer: This note was dictated with voice recognition software. Similar sounding words can inadvertently be transcribed and this note may contain transcription errors which may not have been corrected upon publication of note.**

## 2023-08-06 NOTE — Progress Notes (Signed)
Telephone nursing appointment for review of most recent CT-Chest results. I verified patient's identity x2 and began nursing interview.   Social worker reports patient is doing well. Patient denies any related issues at this time.   Meaningful use complete.   Patient aware of their 1:00pm-08/06/23 telephone appointment w/ Laurence Aly PA-C. I left my extension [phone] in case patient needs anything. Patient verbalized understanding. This concludes the nursing interview.   Patient contact [phone]- Social Worker Raynelle Chary, LPN

## 2023-08-08 ENCOUNTER — Encounter: Payer: Self-pay | Admitting: Podiatry

## 2023-08-08 ENCOUNTER — Ambulatory Visit (INDEPENDENT_AMBULATORY_CARE_PROVIDER_SITE_OTHER): Payer: 59 | Admitting: Podiatry

## 2023-08-08 DIAGNOSIS — B351 Tinea unguium: Secondary | ICD-10-CM | POA: Diagnosis not present

## 2023-08-08 DIAGNOSIS — M79676 Pain in unspecified toe(s): Secondary | ICD-10-CM | POA: Diagnosis not present

## 2023-08-08 DIAGNOSIS — E1142 Type 2 diabetes mellitus with diabetic polyneuropathy: Secondary | ICD-10-CM | POA: Diagnosis not present

## 2023-08-08 NOTE — Progress Notes (Signed)
This patient returns to my office for at risk foot care.  This patient requires this care by a professional since this patient will be at risk due to having  diabetes.  This patient is unable to cut nails himself since the patient cannot reach his nails.These nails are painful walking and wearing shoes.  This patient presents for at risk foot care today.  General Appearance  Alert, conversant and in no acute stress.  Vascular  Dorsalis pedis and posterior tibial  pulses are palpable  bilaterally.  Capillary return is within normal limits  bilaterally. Temperature is within normal limits  bilaterally.  Neurologic  Senn-Weinstein monofilament wire test within normal limits  bilaterally. Muscle power within normal limits bilaterally.  Nails Thick disfigured discolored nails with subungual debris  from hallux to fifth toes bilaterally. No evidence of bacterial infection or drainage bilaterally.  Orthopedic  No limitations of motion  feet .  No crepitus or effusions noted.  No bony pathology or digital deformities noted.  Skin  normotropic skin with no porokeratosis noted bilaterally.  No signs of infections or ulcers noted.     Onychomycosis  Pain in right toes  Pain in left toes  Consent was obtained for treatment procedures.   Mechanical debridement of nails 1-5  bilaterally performed with a nail nipper.  Filed with dremel without incident.    Return office visit    4  months                  Told patient to return for periodic foot care and evaluation due to potential at risk complications.   Helane Gunther DPM

## 2023-08-09 LAB — BASIC METABOLIC PANEL
BUN/Creatinine Ratio: 9 — ABNORMAL LOW (ref 12–28)
BUN: 9 mg/dL (ref 8–27)
CO2: 27 mmol/L (ref 20–29)
Calcium: 9.1 mg/dL (ref 8.7–10.3)
Chloride: 100 mmol/L (ref 96–106)
Creatinine, Ser: 0.99 mg/dL (ref 0.57–1.00)
Glucose: 100 mg/dL — ABNORMAL HIGH (ref 70–99)
Potassium: 4.2 mmol/L (ref 3.5–5.2)
Sodium: 140 mmol/L (ref 134–144)
eGFR: 65 mL/min/{1.73_m2} (ref >59)

## 2023-08-09 LAB — HEPATIC FUNCTION PANEL
ALT: 16 [IU]/L (ref 0–32)
AST: 23 [IU]/L (ref 0–40)
Albumin: 4.3 g/dL (ref 3.8–4.9)
Alkaline Phosphatase: 160 [IU]/L — ABNORMAL HIGH (ref 44–121)
Bilirubin Total: 0.2 mg/dL (ref 0.0–1.2)
Bilirubin, Direct: 0.1 mg/dL (ref 0.00–0.40)
Total Protein: 7 g/dL (ref 6.0–8.5)

## 2023-08-09 LAB — LIPID PANEL
Chol/HDL Ratio: 2.1 {ratio} (ref 0.0–4.4)
Cholesterol, Total: 114 mg/dL (ref 100–199)
HDL: 54 mg/dL (ref >39)
LDL Chol Calc (NIH): 46 mg/dL (ref 0–99)
Triglycerides: 62 mg/dL (ref 0–149)
VLDL Cholesterol Cal: 14 mg/dL (ref 5–40)

## 2023-08-09 LAB — MICROALBUMIN / CREATININE URINE RATIO
Creatinine, Urine: 111.3 mg/dL
Microalb/Creat Ratio: 7 mg/g{creat} (ref 0–29)
Microalbumin, Urine: 8.2 ug/mL

## 2023-08-10 ENCOUNTER — Encounter: Payer: Self-pay | Admitting: Family Medicine

## 2023-08-13 ENCOUNTER — Other Ambulatory Visit: Payer: Self-pay | Admitting: Family Medicine

## 2023-08-13 ENCOUNTER — Ambulatory Visit: Payer: 59 | Admitting: Family Medicine

## 2023-08-13 DIAGNOSIS — K649 Unspecified hemorrhoids: Secondary | ICD-10-CM

## 2023-08-17 ENCOUNTER — Ambulatory Visit: Payer: 59 | Admitting: Family Medicine

## 2023-08-17 VITALS — BP 127/81 | HR 68 | Temp 97.3°F | Ht 70.5 in | Wt 221.0 lb

## 2023-08-17 DIAGNOSIS — Z720 Tobacco use: Secondary | ICD-10-CM

## 2023-08-17 DIAGNOSIS — Z79899 Other long term (current) drug therapy: Secondary | ICD-10-CM

## 2023-08-17 DIAGNOSIS — Z1231 Encounter for screening mammogram for malignant neoplasm of breast: Secondary | ICD-10-CM | POA: Diagnosis not present

## 2023-08-17 DIAGNOSIS — Z23 Encounter for immunization: Secondary | ICD-10-CM

## 2023-08-17 DIAGNOSIS — E1169 Type 2 diabetes mellitus with other specified complication: Secondary | ICD-10-CM | POA: Diagnosis not present

## 2023-08-17 DIAGNOSIS — E119 Type 2 diabetes mellitus without complications: Secondary | ICD-10-CM

## 2023-08-17 DIAGNOSIS — E785 Hyperlipidemia, unspecified: Secondary | ICD-10-CM

## 2023-08-17 NOTE — Progress Notes (Signed)
   Subjective:    Patient ID: [name], female    DOB: 22-Jul-1960, 63 y.o.   MRN: [id number]  Discussed the use of AI scribe software for clinical note transcription with the patient, who gave verbal consent to proceed.  History of Present Illness   [name] is a 63 year old female who presents with difficulty swallowing and shortness of breath. She is accompanied by Delon, her caregiver.  She experiences dysphagia, particularly with bread, which causes choking. She attempts to alleviate this by drinking a lot of water  while eating.  She has dyspnea, describing it as 'hard to catch her breath'. She has not seen her pulmonologist recently due to a previous issue that has since resolved. A recent CT scan was satisfactory.  She is attempting to reduce her smoking and currently smokes about half a pack per day. She mentions feeling bored, which may contribute to her smoking habits.  She is making healthier dietary choices, cutting back on junk food, and increasing her water  intake to about 25-30 glasses a day. Recent blood work showed good cholesterol, liver, kidney function, and sugar levels, although the A1c test was not completed.  She needs to hold onto something to get up due to leg issues, which take her a while to manage. She walks on nice days, usually by herself, and has been trying to avoid sugary drinks.         Review of Systems     Objective:    Physical Exam   VITALS: SaO2- 98% CHEST: Breath sounds clear CARDIOVASCULAR: Heart sounds normal EXTREMITIES: No ankle edema     General-in no acute distress Eyes-no discharge Lungs-respiratory rate normal, CTA CV-no murmurs,RRR Extremities skin warm dry no edema Neuro grossly normal Behavior normal, alert       Assessment & Plan:  Assessment and Plan    Dysphagia Difficulty swallowing bread, leading to choking. Increased fluid intake to aid swallowing. -Continue increased fluid intake with meals.  Chronic  Obstructive Pulmonary Disease (COPD) Shortness of breath reported. Oxygen saturation at 98%. Recent CT scan showed improvement. -Continue current management and follow-up with pulmonologist.  Tobacco Use Smoking half a pack per day, attempting to cut back. -Encouraged to continue efforts to quit smoking.  General Health Maintenance Good control of cholesterol, liver function, kidney function, and blood glucose. A1C not done recently. -Plan to check A1C in 3 months. -Continue healthy dietary choices and increased water  intake. -Administer flu shot and pneumonia vaccine today. -Schedule follow-up visit in June 2025.     1. Hyperlipidemia associated with type 2 diabetes mellitus (HCC) (Primary) Continue statin healthy diet recent labs look good  2. Diabetes mellitus without complication (HCC) Check labs for next visit healthy diet minimize starches try to stay active - Hemoglobin A1c - Basic Metabolic Panel  3. High risk medication use Previous labs look good  4. Tobacco use Patient encouraged to quit smoking  5. Encounter for screening mammogram for malignant neoplasm of breast Screening mammogram - MM 3D SCREENING MAMMOGRAM BILATERAL BREAST  6. Need for vaccination with 20-polyvalent pneumococcal conjugate vaccine Pneumonia vaccine - Pneumococcal conjugate vaccine 20-valent (Prevnar 20)  7. Needs flu shot Flu VAX seen - Flu vaccine trivalent PF, 6mos and older(Flulaval,Afluria,Fluarix,Fluzone)  Follow-up within 4 months

## 2023-08-22 ENCOUNTER — Other Ambulatory Visit: Payer: Self-pay | Admitting: Nurse Practitioner

## 2023-08-24 ENCOUNTER — Other Ambulatory Visit: Payer: Self-pay | Admitting: Family Medicine

## 2023-08-24 ENCOUNTER — Encounter (HOSPITAL_COMMUNITY): Payer: Self-pay

## 2023-08-24 ENCOUNTER — Ambulatory Visit (HOSPITAL_COMMUNITY)
Admission: RE | Admit: 2023-08-24 | Discharge: 2023-08-24 | Disposition: A | Discharge status: Home / Self Care | Payer: 59 | Source: Ambulatory Visit | Attending: Family Medicine | Admitting: Family Medicine

## 2023-08-24 DIAGNOSIS — N644 Mastodynia: Secondary | ICD-10-CM

## 2023-08-30 ENCOUNTER — Ambulatory Visit: Payer: 59 | Admitting: Audiologist

## 2023-09-14 ENCOUNTER — Other Ambulatory Visit: Payer: Self-pay | Admitting: Gastroenterology

## 2023-09-14 ENCOUNTER — Telehealth: Payer: Self-pay

## 2023-09-14 NOTE — Telephone Encounter (Signed)
 Prescription Request  09/14/2023  LOV: Visit date not found  What is the name of the medication or equipment? Apothecary Hemorrhoid Cream   Have you contacted your pharmacy to request a refill? Yes   Which pharmacy would you like this sent to?   [facility]   Patient notified that their request is being sent to the clinical staff for review and that they should receive a response within 2 business days.   Please advise at Mobile [phone] (mobile)

## 2023-09-16 NOTE — Telephone Encounter (Signed)
 I do not think that there is a prescription cream for hemorrhoids available through [place] [employer] Medicare? Please talk with pharmacist at [facility] if they have a recommendation to let us know thank you If there is no cream covered then she will need to use OTC Preparation H etc.

## 2023-09-17 NOTE — Telephone Encounter (Signed)
 Social worker stated she has the money to cover it and needs it sent in - it works better than OTC meds

## 2023-09-17 NOTE — Telephone Encounter (Signed)
 Please call [facility] May authorize them to give her their compounded hemorrhoid cream Apply 4 times daily as needed Please talk with pharmacist to find out what is the standard size they recommend then go ahead and give her that amount may have 2 refills thank you

## 2023-09-19 ENCOUNTER — Other Ambulatory Visit: Payer: Self-pay | Admitting: Family Medicine

## 2023-09-19 ENCOUNTER — Other Ambulatory Visit (HOSPITAL_COMMUNITY): Payer: Self-pay | Admitting: Psychiatry

## 2023-09-19 DIAGNOSIS — E785 Hyperlipidemia, unspecified: Secondary | ICD-10-CM

## 2023-09-19 NOTE — Telephone Encounter (Signed)
 Called in hemorrhoid cream to [facility] 30 g or standard w/ 2 refills

## 2023-09-20 NOTE — Telephone Encounter (Signed)
 Call for appt

## 2023-09-28 NOTE — Telephone Encounter (Signed)
 lmom

## 2023-10-02 ENCOUNTER — Ambulatory Visit: Payer: 59 | Attending: Family Medicine | Admitting: Audiologist

## 2023-10-02 DIAGNOSIS — H903 Sensorineural hearing loss, bilateral: Secondary | ICD-10-CM | POA: Insufficient documentation

## 2023-10-02 NOTE — Procedures (Addendum)
 Outpatient Audiology and [facility] [address] [place], [place]  [address] [phone]  AUDIOLOGICAL  EVALUATION  NAME: [name]     DOB:   Jun 12, 1961      MRN: [id number]                                                                                     DATE: 10/02/2023     REFERENT: Babs Sciara, MD STATUS: Outpatient DIAGNOSIS: Mild to severe sensorineural hearing loss bilaterally  History: [name] was seen for an audiological evaluation  she was accompanied by her [employer] Child psychotherapist. [name] noted that she has been having difficulty hearing in most situations. Specifically, she stated that she struggles to hear the TV, phone, as well as people. [name] noted that she often has to ask people to repeat themselves, and this can sometimes make her feel embarrassed. 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 [name] also endorsed tinnitus, aural pain and fullness in both ears.    Evaluation:  Otoscopy showed a clear view of the tympanic membranes, bilaterally Tympanometry results were consistent with a hypermobile (Type Ad) tympanic membrane in the right ear and normal movement (Type A) of the tympanic membrane in the left ear. Audiometric testing was completed using Conventional Audiometry techniques with insert earphones and TDH headphones. Test results in there right ear are consistent with  predominantly mild sensorineural  hearing loss from 250 Hz to 4,000 Hz sloping to a moderate to severe hearing loss at 6000 Hz and 8000 Hz respectively. In the left ear, test results are consistent with predominately mild sensorineural hearing loss from 250 Hz to 2000 Hz sloping to a moderate to severe hearing loss from 3000 Hz to 8000 Hz. Speech Recognition Thresholds were obtained at 30 dB HL in the right ear and at 30  dB HL in the left ear. Word Recognition Testing was completed at 70 dB HL and [name] scored 100% bilaterally  Of note, the  patient had to repeatedly be re-instructed during pure tone testing and dozed off during the evaluation.  Results:  The test results were reviewed with [name] and her Child psychotherapist. [name] has a mild to severe hearing loss in both ears. Due to the configuration of [name] hearing loss, she is unlike to find benefit from hearing aids. [name] was also unsure if she would be able to consistently use hearing aids at this time.    Recommendations: 1.   Repeat hearing evaluation in 2 years for continued monitoring of hearing, or soon if concerns arise.  2.   Hearing aids in both ears recommended when patient can use on a consistent basis    40 minutes spent testing and counseling on results.   If you have any questions please feel free to contact me at [phone].  Ammie Ferrier Audiologist, Au.D., CCC-A 10/02/2023  1:38 PM  Brendia Sacks B.S.  Audiology Student   During this evaluation, the Audiologist was present, participating in and directing the student.  I agree with the following procedure note after reviewing documentation. This session was performed under the supervision of a  licensed Facilities manager.  During this session, the Audiologist  was present, participating in and directing the treatment.   Cc: Babs Sciara, MD

## 2023-10-08 ENCOUNTER — Other Ambulatory Visit: Payer: Self-pay

## 2023-10-08 ENCOUNTER — Encounter (HOSPITAL_COMMUNITY): Payer: Self-pay

## 2023-10-08 ENCOUNTER — Emergency Department (HOSPITAL_COMMUNITY)
Admission: EM | Admit: 2023-10-08 | Discharge: 2023-10-08 | Disposition: A | Discharge status: Home / Self Care | Attending: Emergency Medicine | Admitting: Emergency Medicine

## 2023-10-08 ENCOUNTER — Emergency Department (HOSPITAL_COMMUNITY)

## 2023-10-08 DIAGNOSIS — I10 Essential (primary) hypertension: Secondary | ICD-10-CM | POA: Insufficient documentation

## 2023-10-08 DIAGNOSIS — E119 Type 2 diabetes mellitus without complications: Secondary | ICD-10-CM | POA: Diagnosis not present

## 2023-10-08 DIAGNOSIS — J449 Chronic obstructive pulmonary disease, unspecified: Secondary | ICD-10-CM | POA: Diagnosis not present

## 2023-10-08 DIAGNOSIS — R600 Localized edema: Secondary | ICD-10-CM | POA: Diagnosis present

## 2023-10-08 DIAGNOSIS — M7989 Other specified soft tissue disorders: Secondary | ICD-10-CM

## 2023-10-08 DIAGNOSIS — Z79899 Other long term (current) drug therapy: Secondary | ICD-10-CM | POA: Diagnosis not present

## 2023-10-08 LAB — CBC WITH DIFFERENTIAL/PLATELET
Abs Immature Granulocytes: 0.01 10*3/uL (ref 0.00–0.07)
Basophils Absolute: 0 10*3/uL (ref 0.0–0.1)
Basophils Relative: 1 %
Eosinophils Absolute: 0.1 10*3/uL (ref 0.0–0.5)
Eosinophils Relative: 1 %
HCT: 37.1 % (ref 36.0–46.0)
Hemoglobin: 12.3 g/dL (ref 12.0–15.0)
Immature Granulocytes: 0 %
Lymphocytes Relative: 31 %
Lymphs Abs: 1.3 10*3/uL (ref 0.7–4.0)
MCH: 31.4 pg (ref 26.0–34.0)
MCHC: 33.2 g/dL (ref 30.0–36.0)
MCV: 94.6 fL (ref 80.0–100.0)
Monocytes Absolute: 0.4 10*3/uL (ref 0.1–1.0)
Monocytes Relative: 9 %
Neutro Abs: 2.5 10*3/uL (ref 1.7–7.7)
Neutrophils Relative %: 58 %
Platelets: 161 10*3/uL (ref 150–400)
RBC: 3.92 MIL/uL (ref 3.87–5.11)
RDW: 14.6 % (ref 11.5–15.5)
WBC: 4.3 10*3/uL (ref 4.0–10.5)
nRBC: 0 % (ref 0.0–0.2)

## 2023-10-08 LAB — COMPREHENSIVE METABOLIC PANEL WITH GFR
ALT: 19 U/L (ref 0–44)
AST: 30 U/L (ref 15–41)
Albumin: 3.7 g/dL (ref 3.5–5.0)
Alkaline Phosphatase: 72 U/L (ref 38–126)
Anion gap: 10 (ref 5–15)
BUN: 7 mg/dL (ref 6–20)
CO2: 27 mmol/L (ref 22–32)
Calcium: 8.8 mg/dL — ABNORMAL LOW (ref 8.9–10.3)
Chloride: 101 mmol/L (ref 98–111)
Creatinine, Ser: 0.92 mg/dL (ref 0.44–1.00)
GFR, Estimated: >60 mL/min (ref >60)
Glucose, Bld: 193 mg/dL — ABNORMAL HIGH (ref 70–99)
Potassium: 3.5 mmol/L (ref 3.5–5.1)
Sodium: 138 mmol/L (ref 135–145)
Total Bilirubin: 0.7 mg/dL (ref 0.0–1.2)
Total Protein: 7.1 g/dL (ref 6.5–8.1)

## 2023-10-08 LAB — BRAIN NATRIURETIC PEPTIDE: B Natriuretic Peptide: 36 pg/mL (ref 0.0–100.0)

## 2023-10-08 LAB — D-DIMER, QUANTITATIVE: D-Dimer, Quant: 0.89 ug{FEU}/mL — ABNORMAL HIGH (ref 0.00–0.50)

## 2023-10-08 LAB — TROPONIN I (HIGH SENSITIVITY)
Troponin I (High Sensitivity): 15 ng/L (ref <18)
Troponin I (High Sensitivity): 15 ng/L (ref <18)

## 2023-10-08 NOTE — ED Triage Notes (Signed)
 Pt arrived via POV from home c/o bilateral lower extremity swelling since Saturday. Pt reports she has fallen recently due to pain with ambulation.

## 2023-10-08 NOTE — Discharge Instructions (Signed)
 We evaluated you for your leg swelling.  Your testing in the emergency department is reassuring.  We obtained an ultrasound which did not show signs of a blood clot.  Your other laboratory testing was also reassuring.  Please follow-up with your primary doctor.  Please keep your leg elevated and wear compression stockings.  If you develop any new or worsening symptoms such as worsening pain, fevers or chills, any other new symptoms, please return to the emergency department.

## 2023-10-08 NOTE — ED Provider Notes (Signed)
 [place] [facility]GENC[facility] DEPARTMENT AT [facility] Provider Note  CSN: [id number] Arrival date & time: 10/08/23 1315  Chief Complaint(s) Leg Swelling  HPI [name] is a 63 y.o. female history of diabetes, hypertension, hyperlipidemia, schizoaffective disorder presenting to the emergency department with swelling to the left leg.  She reports that this has been present for a few days.  Reports pain.  She reports some swelling to the right leg as well.  She reports she always had some swelling.  No fevers or chills.  No chest pain, no shortness of breath.  No recent travel or surgery.  She reports that she had a DVT at some point before and was on a blood thinner but no longer taking this.   Past Medical History Past Medical History:  Diagnosis Date   Anxiety    Aortic atherosclerosis (HCC) 09/19/2020   Seen on CAT scan 2021   Arthritis    Depression    Diabetes mellitus    Diabetes mellitus, type II (HCC)    Dyspnea    Dysrhythmia    GERD (gastroesophageal reflux disease)    Heart murmur    HTN (hypertension)    Hyperglycemia    IBS (irritable bowel syndrome)    Lung cancer (HCC)    Lung nodules    right, followed by PCP, PET 11/2011   Mental retardation    MI (myocardial infarction) (HCC)    Migraines    NAUSEA AND VOMITING 06/09/2010   Pneumonia    Sleep apnea    cpap    Stroke [facility])    Patient Active Problem List   Diagnosis Date Noted   Breast tenderness 11/24/2022   COPD, severe (HCC) 11/22/2022   Breast pain, left 11/03/2022   Encounter for screening fecal occult blood testing 11/03/2022   Encounter for well woman exam with routine gynecological exam 11/03/2022   Fall at home 11/03/2022   Schizoaffective disorder, depressive type (HCC) 09/16/2022   Rectal pain 06/09/2022   DOE (dyspnea on exertion) 01/06/2022   Chronic cough 01/06/2022   Chronic diarrhea 11/22/2021   Primary malignant neoplasm of right middle lobe of lung (HCC) 09/08/2021   Lung  nodule 06/14/2021   S/P bronchoscopy with biopsy    Degenerative arthritis of right knee 11/04/2020   Osteoarthritis of right knee 11/04/2020   Aortic atherosclerosis (HCC) 09/19/2020   History of Helicobacter pylori infection 07/23/2020   S/P hysterectomy 04/19/2020   History of vaginal bleeding 04/19/2020   Fall in bathtub    Neck pain    Bilateral primary osteoarthritis of knee 01/27/2019   Hyperlipidemia 09/26/2017   Dysphagia 08/15/2016   Obstructive sleep apnea syndrome 06/27/2016   Rectal bleeding 04/07/2013   Melena 03/04/2013   Hematemesis 03/04/2013   Abdominal pain, epigastric 03/04/2013   Hypokalemia 03/04/2013   Tobacco abuse 01/16/2012   Diarrhea 12/26/2011   Carcinoid tumor of right lung    Abdominal pain 10/08/2011   Fatty liver 08/24/2011   Constipation 10/20/2010   NAUSEA AND VOMITING 06/09/2010   HEMATOCHEZIA 04/21/2010   OTHER DYSPHAGIA 04/21/2010   MILD MENTAL RETARDATION 02/17/2009   Unspecified glaucoma 02/17/2009   Anxiety state 01/15/2009   Migraine headache 01/15/2009   Essential hypertension 01/15/2009   Hemorrhoids 01/15/2009   GASTROESOPHAGEAL REFLUX DISEASE, CHRONIC 01/15/2009   CONSTIPATION, CHRONIC 01/15/2009   IBS (irritable bowel syndrome) 01/15/2009   KNEE PAIN, CHRONIC 01/15/2009   Backache 01/15/2009   History of colonic polyps 01/15/2009   Home Medication(s) Prior to  Admission medications   Medication Sig Start Date End Date Taking? Authorizing Provider  albuterol (PROVENTIL) (2.5 MG/3ML) 0.083% nebulizer solution Take 3 mLs (2.5 mg total) by nebulization every 6 (six) hours as needed for wheezing or shortness of breath. 06/20/22   Icard, Elige Radon L, DO  atorvastatin (LIPITOR) 10 MG tablet TAKE (1) TABLET BY MOUTH ONCE DAILY. 09/19/23   Babs Sciara, MD  benztropine (COGENTIN) 1 MG tablet TAKE (1) TABLET BY MOUTH ONCE DAILY. 09/20/23   Myrlene Broker, MD  cycloSPORINE (RESTASIS) 0.05 % ophthalmic emulsion Place 1 drop into both  eyes 2 (two) times daily.    [provider]  dicyclomine (BENTYL) 10 MG capsule TAKE 1 CAPSULE BY MOUTH UP TO 3 TIMES DAILY BEFORE MEALS AS NEEDED FOR ABDOMINAL CRAMPS/LOOSE STOOLS. 07/26/23   Tiffany Kocher, PA-C  famotidine (PEPCID) 20 MG tablet TAKE (1) TABLET BY MOUTH TWICE DAILY. 07/25/23   Cook, Jayce G, DO  GOODSENSE HEMORRHOIDAL 0.25-88.44 % suppository UNWRAP AND PLACE 1 SUPPOSITORY RECTALLY 2 TIMES DAILY AS NEEDED FOR HEMORRHOIDS/ANAL [facility] 08/13/23   Luking, Scott A, MD  hydrocortisone (ANUSOL-HC) 2.5 % rectal cream PLACE 1 APPLICATION RECTALLY 2 TIMES DAILY. 09/17/23   Tiffany Kocher, PA-C  lamoTRIgine (LAMICTAL) 100 MG tablet Take 1 tablet (100 mg total) by mouth 2 (two) times daily. 03/20/23   Myrlene Broker, MD  NONFORMULARY OR COMPOUNDED ITEM Hemorrhoid cream    [provider]  paliperidone (INVEGA) 9 MG 24 hr tablet Take 1 tablet (9 mg total) by mouth daily. 03/20/23   Myrlene Broker, MD  potassium chloride (KLOR-CON) 10 MEQ tablet TAKE 2 TABLETS ( ) BY MOUTH TWICE DAILY WITH FOOD 09/19/23   Babs Sciara, MD  PREPARATION H 0.25 % SUPP UNWRAP AND PLACE 1 SUPPOSITORY RECTALLY 2 TIMES DAILY AS NEEDED FOR HEMORRHOIDS/ANAL [facility] 08/30/22   Luking, Jonna Coup, MD  propranolol (INDERAL) 10 MG tablet TAKE (1) TABLET BY MOUTH THREE TIMES DAILY. 07/25/23   Tommie Sams, DO  RABEprazole (ACIPHEX) 20 MG tablet TAKE (1) TABLET BY MOUTH TWICE DAILY. 07/26/23   Tiffany Kocher, PA-C  senna (SENOKOT) 8.6 MG TABS tablet TAKE (1) TABLET BY MOUTH AT BEDTIME. 05/24/23   Tiffany Kocher, PA-C  sertraline (ZOLOFT) 100 MG tablet Take 1 tablet (100 mg total) by mouth daily. 03/20/23   Myrlene Broker, MD  torsemide (DEMADEX) 20 MG tablet TAKE (1) TABLET BY MOUTH EACH MORNING. 04/04/23   Babs Sciara, MD  traZODone (DESYREL) 100 MG tablet TAKE 2 TABLETS(200MG ) BY MOUTH AT BEDTIME. 03/20/23   Myrlene Broker, MD  TRELEGY ELLIPTA 100-62.5-25 MCG/ACT AEPB INHALE (1) PUFF INTO THE LUNGS ONCE  DAILY. 08/23/23   Noemi Chapel, NP  Past Surgical History Past Surgical History:  Procedure Laterality Date   ABDOMINAL HYSTERECTOMY     BIOPSY  01/15/2020   Procedure: BIOPSY;  Surgeon: Corbin Ade, MD;  [facility]: [facility];  Service: Endoscopy;;  gastric   BIOPSY  01/25/2022   Procedure: BIOPSY;  Surgeon: Corbin Ade, MD;  Location: [facility];  Service: Endoscopy;;  ascending;descending;sigmoid;   BRONCHIAL BIOPSY  06/14/2021   Procedure: BRONCHIAL BIOPSIES;  Surgeon: Josephine Igo, DO;  Location: [facility];  Service: Pulmonary;;   BRONCHIAL BRUSHINGS  06/14/2021   Procedure: BRONCHIAL BRUSHINGS;  Surgeon: Josephine Igo, DO;  Location: [facility];  Service: Pulmonary;;   BRONCHIAL NEEDLE ASPIRATION BIOPSY  06/14/2021   Procedure: BRONCHIAL NEEDLE ASPIRATION BIOPSIES;  Surgeon: Josephine Igo, DO;  Location: [facility];  Service: Pulmonary;;   COLONOSCOPY  11/2007   hyperplastic polyps, prior hx of adenomas    COLONOSCOPY  05/2010   incomplete due to poor prep, hyperplastic rectal polyp   COLONOSCOPY  05/05/2002   Dimunitive polyps in the rectum and left colon, cold    biopsied/removed.  Scattered few left-sided diverticula.  Regular colonic   mucosa appeared normal   COLONOSCOPY N/A 05/28/2013   Rourk: mulitple tubular adenomas removed. next tcs 05/2016   COLONOSCOPY WITH PROPOFOL N/A 09/07/2016   Dr. Jena Gauss: For hyperplastic polyps removed. Next colonoscopy March 2023 given history of adenomatous colon polyps in the past.   COLONOSCOPY WITH PROPOFOL N/A 01/25/2022   Redundant/elongated colon, melanosis coli, left-sided diverticula, multiple colon polyps, colonic lipoma. Negative active colitis, adenomas. 3 year surveillance.   ESOPHAGOGASTRODUODENOSCOPY  08/2007   moderate sized hiatal hernia   ESOPHAGOGASTRODUODENOSCOPY   05/2010   noncritical schatzki ring s/p 49F   ESOPHAGOGASTRODUODENOSCOPY (EGD) WITH ESOPHAGEAL DILATION N/A 02/06/2013   ZOX:WRUEAV esophagus-s/p dilation up to a 58 [place] size with [facility] dilators.  Hiatal hernia   ESOPHAGOGASTRODUODENOSCOPY (EGD) WITH PROPOFOL N/A 09/07/2016   Dr. Jena Gauss: Normal, status post empiric dilation of the esophagus for history of dysphagia   ESOPHAGOGASTRODUODENOSCOPY (EGD) WITH PROPOFOL N/A 05/02/2019   Normal esophagus s/p dilation, normal stomach, normal duodenum   ESOPHAGOGASTRODUODENOSCOPY (EGD) WITH PROPOFOL N/A 01/15/2020   normal esophagus s/p dilation, gastric nodule s/p biopsy. This showed reactive gastropathy with H.pylori.    EXTERNAL EAR SURGERY     bilateral   FIDUCIAL MARKER PLACEMENT  06/14/2021   Procedure: FIDUCIAL MARKER PLACEMENT;  Surgeon: Josephine Igo, DO;  [facility]: [facility];  Service: Pulmonary;;   FOOT SURGERY     GLAUCOMA SURGERY     KNEE ARTHROPLASTY Right 11/04/2020   Procedure: COMPUTER ASSISTED TOTAL KNEE ARTHROPLASTY;  Surgeon: Samson Frederic, MD;  Location: [facility];  Service: Orthopedics;  Laterality: Right;   MALONEY DILATION N/A 09/07/2016   Procedure: Elease Hashimoto DILATION;  Surgeon: Corbin Ade, MD;  [facility];  Service: Endoscopy;  Laterality: N/A;   MALONEY DILATION N/A 05/02/2019   Procedure: Elease Hashimoto DILATION;  Surgeon: Corbin Ade, MD;  [facility]: [facility];  Service: Endoscopy;  Laterality: N/A;   MALONEY DILATION N/A 01/15/2020   Procedure: Elease Hashimoto DILATION;  Surgeon: Corbin Ade, MD;  [facility];  Service: Endoscopy;  Laterality: N/A;   POLYPECTOMY  09/07/2016   Procedure: POLYPECTOMY;  Surgeon: Corbin Ade, MD;  [facility];  Service: Endoscopy;;  sigmoid colon x4   POLYPECTOMY  01/25/2022   Procedure: POLYPECTOMY;  Surgeon: Corbin Ade, MD;  [facility];  Service: Endoscopy;;  hepatic flexure and descending/sigmoid colon   small bowel  capsule  10/2007   normal   VIDEO BRONCHOSCOPY WITH RADIAL ENDOBRONCHIAL ULTRASOUND  06/14/2021   Procedure: VIDEO BRONCHOSCOPY WITH RADIAL ENDOBRONCHIAL ULTRASOUND;  Surgeon: Josephine Igo, DO;  Location: [facility];  Service: Pulmonary;;   Family History Family History  Problem Relation Age of Onset   Stroke Mother    Heart attack Father    Sleep apnea Father    Depression Sister    Schizophrenia Sister    Schizophrenia Other    Drug abuse Other    Alcohol abuse Other    Colon cancer Other        aunt   Obesity Other    COPD Other    Anxiety disorder Other    GER disease Other    Diabetes type II Other    Anxiety disorder Other    Depression Other    Liver disease Neg Hx    Inflammatory bowel disease Neg Hx     Social History Social History   Tobacco Use   Smoking status: Every Day    Current packs/day: 0.25    Average packs/day: 0.3 packs/day for 40.0 years (10.0 ttl pk-yrs)    Types: Cigarettes    Passive exposure: Current   Smokeless tobacco: Never  Vaping Use   Vaping status: Never Used  Substance Use Topics   Alcohol use: Yes    Comment: occ   Drug use: No   Allergies Thorazine [chlorpromazine hcl], Acetaminophen, Aspirin, Aspirin-acetaminophen-caffeine, Naproxen, Nsaids, Other, Penicillins, and Tomato  Review of Systems Review of Systems  All other systems reviewed and are negative.   Physical Exam Vital Signs  I have reviewed the triage vital signs BP 112/67   Pulse 83   Temp 98.8 F (37.1 C) (Oral)   Resp (!) 22   Ht 5' 10.5" (1.791 m)   Wt 100.2 kg   SpO2 94%   BMI 31.25 kg/m  Physical Exam Vitals and nursing note reviewed.  Constitutional:      General: She is not in acute distress.    Appearance: She is well-developed.  HENT:     Head: Normocephalic and atraumatic.     Mouth/Throat:     Mouth: Mucous membranes are moist.  Eyes:     Pupils: Pupils are equal, round, and reactive to light.  Cardiovascular:     Rate and  Rhythm: Normal rate and regular rhythm.     Heart sounds: No murmur heard. Pulmonary:     Effort: Pulmonary effort is normal. No respiratory distress.     Breath sounds: Normal breath sounds.  Abdominal:     General: Abdomen is flat.     Palpations: Abdomen is soft.     Tenderness: There is no abdominal tenderness.  Musculoskeletal:        General: No tenderness.     Comments: Right lower extremity with some trace lower extremity edema, left lower extremity with 2+ edema and tenderness no erythema.  No wound.  No focal tenderness other than calf.  Distal pulses intact  Skin:    General: Skin is warm and dry.  Neurological:     General: No focal deficit present.     Mental Status: She is alert. Mental status is at baseline.  Psychiatric:        Mood and Affect: Mood normal.        Behavior: Behavior normal.     ED Results and Treatments Labs (  all labs ordered are listed, but only abnormal results are displayed) Labs Reviewed  COMPREHENSIVE METABOLIC PANEL WITH GFR - Abnormal; Notable for the following components:      Result Value   Glucose, Bld 193 (*)    Calcium 8.8 (*)    All other components within normal limits  D-DIMER, QUANTITATIVE - Abnormal; Notable for the following components:   D-Dimer, Quant 0.89 (*)    All other components within normal limits  CBC WITH DIFFERENTIAL/PLATELET  BRAIN NATRIURETIC PEPTIDE  TROPONIN I (HIGH SENSITIVITY)  TROPONIN I (HIGH SENSITIVITY)                                                                                                                          Radiology US Venous Img Lower Bilateral (DVT) Result Date: 10/08/2023 CLINICAL DATA:  Bilateral lower extremity swelling. EXAM: BILATERAL LOWER EXTREMITY VENOUS DOPPLER ULTRASOUND TECHNIQUE: Gray-scale sonography with graded compression, as well as color Doppler and duplex ultrasound were performed to evaluate the lower extremity deep venous systems from the level of the common  femoral vein and including the common femoral, femoral, profunda femoral, popliteal and calf veins including the posterior tibial, peroneal and gastrocnemius veins when visible. The superficial great saphenous vein was also interrogated. Spectral Doppler was utilized to evaluate flow at rest and with distal augmentation maneuvers in the common femoral, femoral and popliteal veins. COMPARISON:  March 22, 2019 FINDINGS: RIGHT LOWER EXTREMITY Common Femoral Vein: No evidence of thrombus. Normal compressibility, respiratory phasicity and response to augmentation. Saphenofemoral Junction: No evidence of thrombus. Normal compressibility and flow on color Doppler imaging. Profunda Femoral Vein: No evidence of thrombus. Normal compressibility and flow on color Doppler imaging. Femoral Vein: No evidence of thrombus. Normal compressibility, respiratory phasicity and response to augmentation. Popliteal Vein: No evidence of thrombus. Normal compressibility, respiratory phasicity and response to augmentation. Calf Veins: The RIGHT posterior tibial vein and RIGHT peroneal vein are poorly visualized. Superficial Great Saphenous Vein: No evidence of thrombus. Normal compressibility. Venous Reflux:  None. Other Findings:  None. LEFT LOWER EXTREMITY Common Femoral Vein: No evidence of thrombus. Normal compressibility, respiratory phasicity and response to augmentation. Saphenofemoral Junction: No evidence of thrombus. Normal compressibility and flow on color Doppler imaging. Profunda Femoral Vein: No evidence of thrombus. Normal compressibility and flow on color Doppler imaging. Femoral Vein: No evidence of thrombus. Normal compressibility, respiratory phasicity and response to augmentation. Popliteal Vein: No evidence of thrombus. Normal compressibility, respiratory phasicity and response to augmentation. Calf Veins: The LEFT posterior tibial vein and LEFT peroneal vein are poorly visualized. Superficial Great Saphenous Vein: No  evidence of thrombus. Normal compressibility. Venous Reflux:  None. Other Findings:  None. IMPRESSION: Limited evaluation of the BILATERAL calf veins, without evidence of deep venous thrombosis in either lower extremity. Electronically Signed   By: Aram Candela M.D.   On: 10/08/2023 18:49    Pertinent labs & imaging results that were available during my care of the patient were reviewed by  me and considered in my medical decision making (see MDM for details).  Medications Ordered in ED Medications - No data to display                                                                                                                                   Procedures Procedures  (including critical care time)  Medical Decision Making / ED Course   MDM:  63 year old presenting to the emergency department with leg swelling.  On exam, patient has mainly left lower extremity swelling with tenderness although she has some right lower extremity swelling as well.  She reports previous history of DVT.  Her D-dimer is mildly elevated.  Will obtain DVT ultrasound.  No lower extremity wounds or signs of infection.  She denies any chest pain or shortness of breath to suggest PE.  Lower concern for CHF, no shortness of breath, swelling is not symmetric.  No focal tenderness other than calf tenderness suggest any traumatic injury as a cause of the swelling.  Will reassess.  Clinical Course as of 10/08/23 2010  Mon Oct 08, 2023  2008 DVT ultrasound negative.  Patient otherwise denies complaints and feels well.  She reports that she used to have compression stockings but did not use them because they are too tight.  Advised that she should try this again and keep her leg elevated.  Unclear cause of swelling, but with negative ultrasound, no clear signs of infection, no other findings, low concern for dangerous process could be related to chronic venous insufficiency.  Do not think patient needs admission or  further workup at this time. Will discharge patient to home. All questions answered. Patient comfortable with plan of discharge. Return precautions discussed with patient and specified on the after visit summary.  [WS]    Clinical Course User Index [WS] Lonell Grandchild, MD     Additional history obtained:  -External records from outside source obtained and reviewed including: Chart review including previous notes, labs, imaging, consultation notes including prior notes    Lab Tests: -I ordered, reviewed, and interpreted labs.   The pertinent results include:   Labs Reviewed  COMPREHENSIVE METABOLIC PANEL WITH GFR - Abnormal; Notable for the following components:      Result Value   Glucose, Bld 193 (*)    Calcium 8.8 (*)    All other components within normal limits  D-DIMER, QUANTITATIVE - Abnormal; Notable for the following components:   D-Dimer, Quant 0.89 (*)    All other components within normal limits  CBC WITH DIFFERENTIAL/PLATELET  BRAIN NATRIURETIC PEPTIDE  TROPONIN I (HIGH SENSITIVITY)  TROPONIN I (HIGH SENSITIVITY)    Notable for elevated d-dimer  Imaging Studies ordered: I ordered imaging studies including DVT US  On my interpretation imaging demonstrates negative results  I independently visualized and interpreted imaging. I agree with the radiologist interpretation   Medicines ordered and prescription drug management: No orders  of the defined types were placed in this encounter.   -I have reviewed the patients home medicines and have made adjustments as needed  Social Determinants of Health:  Diagnosis or treatment significantly limited by social determinants of health: obesity   Reevaluation: After the interventions noted above, I reevaluated the patient and found that their symptoms have improved  Co morbidities that complicate the patient evaluation  Past Medical History:  Diagnosis Date   Anxiety    Aortic atherosclerosis (HCC) 09/19/2020    Seen on CAT scan 2021   Arthritis    Depression    Diabetes mellitus    Diabetes mellitus, type II (HCC)    Dyspnea    Dysrhythmia    GERD (gastroesophageal reflux disease)    Heart murmur    HTN (hypertension)    Hyperglycemia    IBS (irritable bowel syndrome)    Lung cancer (HCC)    Lung nodules    right, followed by PCP, PET 11/2011   Mental retardation    MI (myocardial infarction) (HCC)    Migraines    NAUSEA AND VOMITING 06/09/2010   Pneumonia    Sleep apnea    cpap    Stroke [facility])       Dispostion: Disposition decision including need for hospitalization was considered, and patient discharged from emergency department.    Final Clinical Impression(s) / ED Diagnoses Final diagnoses:  Leg swelling     This chart was dictated using voice recognition software.  Despite best efforts to proofread,  errors can occur which can change the documentation meaning.    Lonell Grandchild, MD 10/08/23 2010

## 2023-10-09 ENCOUNTER — Ambulatory Visit (HOSPITAL_COMMUNITY)
Admission: RE | Admit: 2023-10-09 | Discharge: 2023-10-09 | Disposition: A | Discharge status: Home / Self Care | Payer: 59 | Source: Ambulatory Visit | Attending: Family Medicine | Admitting: Family Medicine

## 2023-10-09 ENCOUNTER — Encounter (HOSPITAL_COMMUNITY): Payer: Self-pay

## 2023-10-09 DIAGNOSIS — N644 Mastodynia: Secondary | ICD-10-CM

## 2023-10-09 DIAGNOSIS — Z1231 Encounter for screening mammogram for malignant neoplasm of breast: Secondary | ICD-10-CM | POA: Diagnosis present

## 2023-10-10 ENCOUNTER — Encounter (HOSPITAL_COMMUNITY): Payer: Self-pay | Admitting: Psychiatry

## 2023-10-10 ENCOUNTER — Other Ambulatory Visit: Payer: Self-pay | Admitting: Gastroenterology

## 2023-10-10 ENCOUNTER — Ambulatory Visit (INDEPENDENT_AMBULATORY_CARE_PROVIDER_SITE_OTHER): Admitting: Psychiatry

## 2023-10-10 VITALS — BP 115/68 | HR 87 | Ht 70.5 in | Wt 232.0 lb

## 2023-10-10 DIAGNOSIS — F251 Schizoaffective disorder, depressive type: Secondary | ICD-10-CM | POA: Diagnosis not present

## 2023-10-10 MED ORDER — VALBENAZINE TOSYLATE 80 MG PO CAPS: 80.0000 mg | ORAL_CAPSULE | Freq: Every day | ORAL | 2 refills | Status: DC

## 2023-10-10 MED ORDER — PALIPERIDONE ER 9 MG PO TB24: 9.0000 mg | ORAL_TABLET | Freq: Every day | ORAL | 5 refills | Status: DC

## 2023-10-10 MED ORDER — DOXEPIN HCL 25 MG PO CAPS
25.0000 mg | ORAL_CAPSULE | Freq: Every day | ORAL | 2 refills | Status: AC
Start: 2023-10-10 — End: ?

## 2023-10-10 MED ORDER — LAMOTRIGINE 100 MG PO TABS: 100.0000 mg | ORAL_TABLET | Freq: Two times a day (BID) | ORAL | 5 refills | Status: DC

## 2023-10-10 MED ORDER — BENZTROPINE MESYLATE 1 MG PO TABS: 1.0000 mg | ORAL_TABLET | Freq: Every day | ORAL | 5 refills | Status: DC

## 2023-10-10 MED ORDER — SERTRALINE HCL 100 MG PO TABS: 100.0000 mg | ORAL_TABLET | Freq: Every day | ORAL | 5 refills | Status: DC

## 2023-10-10 NOTE — Progress Notes (Signed)
 [facility] MD/PA/NP OP Progress Note  10/10/2023 1:45 PM [name]  MRN:  [id number]  Chief Complaint:  Chief Complaint  Patient presents with   Schizophrenia   Depression   Follow-up   HPI: This patient is a 63 year old divorced white female who lives alone in [place]. She is on disability.   The patient and her social worker [name] return for follow-up after about 6 months.  This is regarding her schizoaffective disorder.  She states that she is doing okay.  She still has problems with mobility and is using a walker.  2 days ago and her legs were really swollen and she went to the emergency room but they did not really find anything abnormal and her Doppler studies were also normal.  She again complains of poor sleep.  She thinks that trazodone is not working and she often stays up all night.  I suggested a switch to doxepin and she is in agreement.  She denies severe depression anxiety thoughts of self-harm or auditory visual hallucinations.  She does note twitching and jerking in her upper extremities and sometimes her head jerks.  I did not see this today by her social worker about that she has seen it quite a bit recently.  I explained that we could add Ingrezza to help with this. Visit Diagnosis:    ICD-10-CM   1. Schizoaffective disorder, depressive type (HCC)  F25.1       Past Psychiatric History: Hospitalization in her younger years, most recently outpatient treatment  Past Medical History:  Past Medical History:  Diagnosis Date   Anxiety    Aortic atherosclerosis (HCC) 09/19/2020   Seen on CAT scan 2021   Arthritis    Depression    Diabetes mellitus    Diabetes mellitus, type II (HCC)    Dyspnea    Dysrhythmia    GERD (gastroesophageal reflux disease)    Heart murmur    HTN (hypertension)    Hyperglycemia    IBS (irritable bowel syndrome)    Lung cancer (HCC)    Lung nodules    right, followed by PCP, PET 11/2011   Mental retardation    MI (myocardial infarction)  (HCC)    Migraines    NAUSEA AND VOMITING 06/09/2010   Pneumonia    Sleep apnea    cpap    Stroke [facility])     Past Surgical History:  Procedure Laterality Date   ABDOMINAL HYSTERECTOMY     BIOPSY  01/15/2020   Procedure: BIOPSY;  Surgeon: Corbin Ade, MD;  [facility];  Service: Endoscopy;;  gastric   BIOPSY  01/25/2022   Procedure: BIOPSY;  Surgeon: Corbin Ade, MD;  [facility];  Service: Endoscopy;;  ascending;descending;sigmoid;   BRONCHIAL BIOPSY  06/14/2021   Procedure: BRONCHIAL BIOPSIES;  Surgeon: Josephine Igo, DO;  [facility];  Service: Pulmonary;;   BRONCHIAL BRUSHINGS  06/14/2021   Procedure: BRONCHIAL BRUSHINGS;  Surgeon: Josephine Igo, DO;  [facility];  Service: Pulmonary;;   BRONCHIAL NEEDLE ASPIRATION BIOPSY  06/14/2021   Procedure: BRONCHIAL NEEDLE ASPIRATION BIOPSIES;  Surgeon: Josephine Igo, DO;  [facility];  Service: Pulmonary;;   COLONOSCOPY  11/2007   hyperplastic polyps, prior hx of adenomas    COLONOSCOPY  05/2010   incomplete due to poor prep, hyperplastic rectal polyp   COLONOSCOPY  05/05/2002   Dimunitive polyps in the rectum and left colon, cold    biopsied/removed.  Scattered few left-sided  diverticula.  Regular colonic   mucosa appeared normal   COLONOSCOPY N/A 05/28/2013   Rourk: mulitple tubular adenomas removed. next tcs 05/2016   COLONOSCOPY WITH PROPOFOL N/A 09/07/2016   Dr. Jena Gauss: For hyperplastic polyps removed. Next colonoscopy March 2023 given history of adenomatous colon polyps in the past.   COLONOSCOPY WITH PROPOFOL N/A 01/25/2022   Redundant/elongated colon, melanosis coli, left-sided diverticula, multiple colon polyps, colonic lipoma. Negative active colitis, adenomas. 3 year surveillance.   ESOPHAGOGASTRODUODENOSCOPY  08/2007   moderate sized hiatal hernia   ESOPHAGOGASTRODUODENOSCOPY  05/2010   noncritical schatzki ring s/p 36F   ESOPHAGOGASTRODUODENOSCOPY  (EGD) WITH ESOPHAGEAL DILATION N/A 02/06/2013   ZOX:WRUEAV esophagus-s/p dilation up to a 58 [place] size with [facility] dilators.  Hiatal hernia   ESOPHAGOGASTRODUODENOSCOPY (EGD) WITH PROPOFOL N/A 09/07/2016   Dr. Jena Gauss: Normal, status post empiric dilation of the esophagus for history of dysphagia   ESOPHAGOGASTRODUODENOSCOPY (EGD) WITH PROPOFOL N/A 05/02/2019   Normal esophagus s/p dilation, normal stomach, normal duodenum   ESOPHAGOGASTRODUODENOSCOPY (EGD) WITH PROPOFOL N/A 01/15/2020   normal esophagus s/p dilation, gastric nodule s/p biopsy. This showed reactive gastropathy with H.pylori.    EXTERNAL EAR SURGERY     bilateral   FIDUCIAL MARKER PLACEMENT  06/14/2021   Procedure: FIDUCIAL MARKER PLACEMENT;  Surgeon: Josephine Igo, DO;  [facility];  Service: Pulmonary;;   FOOT SURGERY     GLAUCOMA SURGERY     KNEE ARTHROPLASTY Right 11/04/2020   Procedure: COMPUTER ASSISTED TOTAL KNEE ARTHROPLASTY;  Surgeon: Samson Frederic, MD;  Location: [facility];  Service: Orthopedics;  Laterality: Right;   MALONEY DILATION N/A 09/07/2016   Procedure: Elease Hashimoto DILATION;  Surgeon: Corbin Ade, MD;  [facility];  Service: Endoscopy;  Laterality: N/A;   MALONEY DILATION N/A 05/02/2019   Procedure: Elease Hashimoto DILATION;  Surgeon: Corbin Ade, MD;  [facility];  Service: Endoscopy;  Laterality: N/A;   MALONEY DILATION N/A 01/15/2020   Procedure: Elease Hashimoto DILATION;  Surgeon: Corbin Ade, MD;  [facility];  Service: Endoscopy;  Laterality: N/A;   POLYPECTOMY  09/07/2016   Procedure: POLYPECTOMY;  Surgeon: Corbin Ade, MD;  [facility];  Service: Endoscopy;;  sigmoid colon x4   POLYPECTOMY  01/25/2022   Procedure: POLYPECTOMY;  Surgeon: Corbin Ade, MD;  [facility];  Service: Endoscopy;;  hepatic flexure and descending/sigmoid colon   small bowel capsule  10/2007   normal   VIDEO BRONCHOSCOPY WITH RADIAL ENDOBRONCHIAL  ULTRASOUND  06/14/2021   Procedure: VIDEO BRONCHOSCOPY WITH RADIAL ENDOBRONCHIAL ULTRASOUND;  Surgeon: Josephine Igo, DO;  [facility];  Service: Pulmonary;;    Family Psychiatric History: See below  Family History:  Family History  Problem Relation Age of Onset   Stroke Mother    Heart attack Father    Sleep apnea Father    Depression Sister    Schizophrenia Sister    Schizophrenia Other    Drug abuse Other    Alcohol abuse Other    Colon cancer Other        aunt   Obesity Other    COPD Other    Anxiety disorder Other    GER disease Other    Diabetes type II Other    Anxiety disorder Other    Depression Other    Liver disease Neg Hx    Inflammatory bowel disease Neg Hx     Social History:  Social History   Socioeconomic History  Marital status: Divorced    Spouse name: Not on file   Number of children: Not on file   Years of education: Not on file   Highest education level: High school graduate  Occupational History   Occupation: disabled    Employer: UNEMPLOYED  Tobacco Use   Smoking status: Every Day    Current packs/day: 0.25    Average packs/day: 0.3 packs/day for 40.0 years (10.0 ttl pk-yrs)    Types: Cigarettes    Passive exposure: Current   Smokeless tobacco: Never  Vaping Use   Vaping status: Never Used  Substance and Sexual Activity   Alcohol use: Yes    Comment: occ   Drug use: No   Sexual activity: Yes    Partners: Male    Birth control/protection: Surgical    Comment: hyst  Other Topics Concern   Not on file  Social History Narrative   Not on file   Social Drivers of Health   Financial Resource Strain: Low Risk  (07/20/2023)   Overall Financial Resource Strain (CARDIA)    Difficulty of Paying Living Expenses: Not hard at all  Food Insecurity: Food Insecurity Present (07/20/2023)   Hunger Vital Sign    Worried About Running Out of Food in the Last Year: Sometimes true    Ran Out of Food in the Last Year: Sometimes true   Transportation Needs: No Transportation Needs (07/20/2023)   PRAPARE - Administrator, Civil Service (Medical): No    Lack of Transportation (Non-Medical): No  Physical Activity: Patient Declined (07/20/2023)   Exercise Vital Sign    Days of Exercise per Week: Patient declined    Minutes of Exercise per Session: Patient declined  Stress: Stress Concern Present (07/20/2023)   [employer] of Occupational Health - Occupational Stress Questionnaire    Feeling of Stress : Very much  Social Connections: Socially Isolated (07/20/2023)   Social Connection and Isolation Panel [NHANES]    Frequency of Communication with Friends and Family: Twice a week    Frequency of Social Gatherings with Friends and Family: Never    Attends Religious Services: Never    Database administrator or Organizations: No    Attends Banker Meetings: Never    Marital Status: Separated    Allergies:  Allergies  Allergen Reactions   Thorazine [Chlorpromazine Hcl] Anaphylaxis   Acetaminophen Other (See Comments)    Makes pt dizzy   Aspirin Other (See Comments)    seizure   Aspirin-Acetaminophen-Caffeine Other (See Comments)    seizure   Naproxen Nausea And Vomiting   Nsaids Nausea And Vomiting   Other     Acidic foods   Penicillins Nausea And Vomiting    Has patient had a PCN reaction causing immediate rash, facial/tongue/throat swelling, SOB or lightheadedness with hypotension:Yes Has patient had a PCN reaction causing severe rash involving mucus membranes or skin necrosis:Yes Has patient had a PCN reaction that required hospitalization:Yes Has patient had a PCN reaction occurring within the last 10 years:No If all of the above answers are "NO", then may proceed with Cephalosporin use.    Tomato Rash    Metabolic Disorder Labs: Lab Results  Component Value Date   HGBA1C 6.4 (H) 06/28/2022   MPG 126 10/25/2020   No results found for: "PROLACTIN" Lab Results  Component  Value Date   CHOL 114 08/08/2023   TRIG 62 08/08/2023   HDL 54 08/08/2023   CHOLHDL 2.1 08/08/2023   VLDL 21 07/20/2014  LDLCALC 46 08/08/2023   LDLCALC 48 06/28/2022   Lab Results  Component Value Date   TSH 4.492 03/22/2019   TSH 3.180 02/28/2018    Therapeutic Level Labs: No results found for: "LITHIUM" No results found for: "VALPROATE" No results found for: "CBMZ"  Current Medications: Current Outpatient Medications  Medication Sig Dispense Refill   doxepin (SINEQUAN) 25 MG capsule Take 1 capsule (25 mg total) by mouth at bedtime. 30 capsule 2   valbenazine (INGREZZA) 80 MG capsule Take 1 capsule (80 mg total) by mouth daily. 30 capsule 2   albuterol (PROVENTIL) (2.5 MG/3ML) 0.083% nebulizer solution Take 3 mLs (2.5 mg total) by nebulization every 6 (six) hours as needed for wheezing or shortness of breath. 75 mL 12   atorvastatin (LIPITOR) 10 MG tablet TAKE (1) TABLET BY MOUTH ONCE DAILY. 30 tablet 5   benztropine (COGENTIN) 1 MG tablet Take 1 tablet (1 mg total) by mouth daily. 30 tablet 5   cycloSPORINE (RESTASIS) 0.05 % ophthalmic emulsion Place 1 drop into both eyes 2 (two) times daily.     dicyclomine (BENTYL) 10 MG capsule TAKE 1 CAPSULE BY MOUTH UP TO 3 TIMES DAILY BEFORE MEALS AS NEEDED FOR ABDOMINAL CRAMPS/LOOSE STOOLS. 90 capsule 11   famotidine (PEPCID) 20 MG tablet TAKE (1) TABLET BY MOUTH TWICE DAILY. 60 tablet 11   GOODSENSE HEMORRHOIDAL 0.25-88.44 % suppository UNWRAP AND PLACE 1 SUPPOSITORY RECTALLY 2 TIMES DAILY AS NEEDED FOR HEMORRHOIDS/ANAL ITCH 12 suppository 4   hydrocortisone (ANUSOL-HC) 2.5 % rectal cream PLACE 1 APPLICATION RECTALLY 2 TIMES DAILY. 30 g 0   lamoTRIgine (LAMICTAL) 100 MG tablet Take 1 tablet (100 mg total) by mouth 2 (two) times daily. 60 tablet 5   NONFORMULARY OR COMPOUNDED ITEM Hemorrhoid cream     paliperidone (INVEGA) 9 MG 24 hr tablet Take 1 tablet (9 mg total) by mouth daily. 30 tablet 5   potassium chloride (KLOR-CON) 10 MEQ  tablet TAKE 2 TABLETS ( ) BY MOUTH TWICE DAILY WITH FOOD 120 tablet 4   PREPARATION H 0.25 % SUPP UNWRAP AND PLACE 1 SUPPOSITORY RECTALLY 2 TIMES DAILY AS NEEDED FOR HEMORRHOIDS/ANAL ITCH 12 suppository 0   propranolol (INDERAL) 10 MG tablet TAKE (1) TABLET BY MOUTH THREE TIMES DAILY. 90 tablet 11   RABEprazole (ACIPHEX) 20 MG tablet TAKE (1) TABLET BY MOUTH TWICE DAILY. 60 tablet 11   senna (SENOKOT) 8.6 MG TABS tablet TAKE (1) TABLET BY MOUTH AT BEDTIME. 30 tablet 5   sertraline (ZOLOFT) 100 MG tablet Take 1 tablet (100 mg total) by mouth daily. 30 tablet 5   torsemide (DEMADEX) 20 MG tablet TAKE (1) TABLET BY MOUTH EACH MORNING. 30 tablet 5   TRELEGY ELLIPTA 100-62.5-25 MCG/ACT AEPB INHALE (1) PUFF INTO THE LUNGS ONCE DAILY. 60 each 0   No current facility-administered medications for this visit.     Musculoskeletal: Strength & Muscle Tone: decreased Gait & Station: unsteady Patient leans: N/A  Psychiatric Specialty Exam: Review of Systems  Musculoskeletal:  Positive for arthralgias and gait problem.  Neurological:  Positive for tremors and weakness.  Psychiatric/Behavioral:  Positive for sleep disturbance.   All other systems reviewed and are negative.   Blood pressure 115/68, pulse 87, height 5' 10.5" (1.791 m), weight 232 lb (105.2 kg), SpO2 97%.Body mass index is 32.82 kg/m.  General Appearance: Casual and Disheveled  Eye Contact:  Fair  Speech:  Clear and Coherent  Volume:  Normal  Mood:  Euthymic  Affect:  Congruent  Thought Process:  Goal Directed  Orientation:  Full (Time, Place, and Person)  Thought Content: WDL   Suicidal Thoughts:  No  Homicidal Thoughts:  No  Memory:  Immediate;   Good Recent;   Fair Remote;   NA  Judgement:  Poor  Insight:  Shallow  Psychomotor Activity:  TD  Concentration:  Concentration: Fair and Attention Span: Fair  Recall:  [employer] of Knowledge: Fair  Language: Good  Akathisia:  No  Handed:  Right  AIMS (if indicated):  Significant for twitches and shakes in her upper extremities and head although this was not witnessed today  Assets:  Communication Skills Desire for Improvement Resilience Social Support  ADL's:  Intact  Cognition: Impaired,  Mild  Sleep:  Poor   Screenings: GAD-7    Flowsheet Row Office Visit from 08/17/2023 in [facility] [place] [facility] Office Visit from 11/03/2022 in [facility] for [facility] at [facility] Office Visit from 09/15/2022 in [facility] [place] [facility] Office Visit from 06/28/2022 in [facility] [place] [facility] Office Visit from 04/24/2022 in [facility]  Total GAD-7 Score 15 19 18 17 21       PHQ2-9    Flowsheet Row Office Visit from 10/10/2023 in [facility] [place] Office Visit from 08/17/2023 in [facility] [place] [facility] Clinical Support from 07/20/2023 in [facility] Office Visit from 11/03/2022 in [facility] for [facility] at [facility] Office Visit from 09/15/2022 in [facility] [place] [facility]  PHQ-2 Total Score 3 6 1 6 6   PHQ-9 Total Score 12 14 8 20 17       Flowsheet Row ED from 10/08/2023 in [facility] Emergency Department at [facility] ED from 09/01/2022 in [facility] Emergency Department at [facility] ED from 04/07/2022 in [facility] Care at [facility] RISK CATEGORY No Risk No Risk No Risk        Assessment and Plan: This patient is a 63 year old female with a history of schizoaffective disorder and cognitive impairment.  She complains of twitching and jerking which may be consistent with tardive dyskinesia so we will add Ingrezza 80 mg to her regimen.  She also is not sleeping well with trazodone so we will switch to doxepin 25 mg at bedtime.  She will continue Zoloft 100 mg daily for depression, Invega 9 mg daily for schizophrenic symptoms and Lamictal 100  mg twice daily for mood stabilization and Cogentin 1 mg to prevent parkinsonian effects from [place].  She will return to see me in 3 months  Collaboration of Care: Collaboration of Care: Primary Care Provider AEB notes are shared with PCP on the epic system  Patient/Guardian was advised Release of Information must be obtained prior to any record release in order to collaborate their care with an outside provider. Patient/Guardian was advised if they have not already done so to contact the registration department to sign all necessary forms in order for [place] to release information regarding their care.   Consent: Patient/Guardian gives verbal consent for treatment and assignment of benefits for services provided during this visit. Patient/Guardian expressed understanding and agreed to proceed.    Diannia Ruder, MD 10/10/2023, 1:45 PM

## 2023-10-11 ENCOUNTER — Telehealth (HOSPITAL_COMMUNITY): Payer: Self-pay | Admitting: *Deleted

## 2023-10-11 ENCOUNTER — Other Ambulatory Visit (HOSPITAL_COMMUNITY): Payer: Self-pay | Admitting: Psychiatry

## 2023-10-11 ENCOUNTER — Telehealth (HOSPITAL_COMMUNITY): Payer: Self-pay

## 2023-10-11 MED ORDER — VALBENAZINE TOSYLATE 40 MG PO CAPS: 40.0000 mg | ORAL_CAPSULE | Freq: Every day | ORAL | 0 refills | Status: DC

## 2023-10-11 NOTE — Telephone Encounter (Signed)
 Opened in Error.

## 2023-10-11 NOTE — Telephone Encounter (Signed)
 Sent. Let him know that pt has cognitive limitations and this will need to be explained to her social worker

## 2023-10-11 NOTE — Telephone Encounter (Signed)
 She does not need to stay on hydrocortisone indefinitely. Refilled 09/2023.

## 2023-10-11 NOTE — Telephone Encounter (Signed)
 Rx care called in stating that usually patients start with 40 MG for 7 days and then increase up. Please advise

## 2023-10-12 ENCOUNTER — Encounter: Payer: Self-pay | Admitting: Family Medicine

## 2023-10-12 ENCOUNTER — Ambulatory Visit: Admitting: Family Medicine

## 2023-10-12 ENCOUNTER — Other Ambulatory Visit: Payer: Self-pay

## 2023-10-12 VITALS — BP 113/56 | HR 72 | Temp 97.2°F | Ht 70.5 in | Wt 231.0 lb

## 2023-10-12 DIAGNOSIS — R0609 Other forms of dyspnea: Secondary | ICD-10-CM | POA: Diagnosis not present

## 2023-10-12 DIAGNOSIS — R600 Localized edema: Secondary | ICD-10-CM | POA: Diagnosis not present

## 2023-10-12 NOTE — Progress Notes (Signed)
 Subjective:    Patient ID: [name], female    DOB: 03/24/1961, 63 y.o.   MRN: [id number]  HPI ER follow up for bilateral ankle swelling -tests were done Continues foot and knee pain  Has involuntary movements - psych has changed meds - added ingrezza and D/C'd Trazadone  Discussed the use of AI scribe software for clinical note transcription with the patient, who gave verbal consent to proceed.  History of Present Illness   [name] is a 63 year old female who presents with shortness of breath and weakness. She is accompanied by her sister.  She has been experiencing shortness of breath for the past two weeks, particularly with exertion such as moving around or walking. She also experiences difficulty rising from a seated position due to weakness and knee pain. No chest pain, palpitations, or dizziness. She attributes some of her sleepiness to her mental health medications, noting a recent change from trazodone to doxepin for sleep.  Bilateral leg swelling has been present for about a month, affecting both legs, with more pronounced swelling on one side. She visited the emergency department where blood tests and an ultrasound were performed, showing no signs of blood clots. There is no history of congestive heart failure or recent trauma to the legs.  She has a history of dietary habits that include high salt intake and junk food, although she notes some improvement, consuming less bacon. She acknowledges eating 'a whole lot of junk' but states her habits are 'a little better than what she used to be.'  She smokes about eight cigarettes a day and is trying to reduce her smoking. No use of alcohol or illicit drugs reported.  She has a history of jerking movements, which started recently, and she is taking medication for this. She also reports having had a visual visit with Doctor Lucina Mellow, which went better than usual, and she is scheduled for follow-up in a month or two.  She has an  aide who assists her three to four days a week, arriving at 11:30 AM. However, due to multiple appointments, the aide has not been present since the swelling began. Her sister is involved in her care, often taking her out for rides and to places, although she sometimes finds it difficult to enjoy due to her physical condition.       Review of Systems     Objective:   Physical Exam General-in no acute distress Eyes-no discharge Lungs-respiratory rate normal, CTA CV-no murmurs,RRR Extremities skin warm dry pedal edema worse on the left leg small amount of edema on the right leg Neuro grossly normal Behavior normal, alert  Recent ER visit ultrasound negative for blood clots      Assessment & Plan:  1. Leg edema, left (Primary) Keep legs propped up Recheck again in 4 weeks If edema becomes prevalent on both sides would consider diuretic but for now keeping legs propped up We did discuss compression stockings but unlikely patient would be compliant   2. DOE (dyspnea on exertion) Echo ordered Patient does have underlying lung issues still a smoker  The patient is often difficult to accurately figure out.  She has significant psychiatric health issues which sometimes makes her symptoms relatively prominent more so than what they may already be.  Because of this it is hard to discern definitively what is going on.  In her situation I do think it is reasonable to do an echo to make sure that there is not  left ventricular dysfunction

## 2023-10-17 ENCOUNTER — Other Ambulatory Visit: Payer: Self-pay | Admitting: Gastroenterology

## 2023-10-22 ENCOUNTER — Other Ambulatory Visit: Payer: Self-pay | Admitting: Nurse Practitioner

## 2023-10-22 ENCOUNTER — Other Ambulatory Visit: Payer: Self-pay | Admitting: Family Medicine

## 2023-10-30 ENCOUNTER — Other Ambulatory Visit: Payer: Self-pay | Admitting: Family Medicine

## 2023-10-30 ENCOUNTER — Other Ambulatory Visit: Payer: Self-pay | Admitting: Gastroenterology

## 2023-10-30 DIAGNOSIS — K649 Unspecified hemorrhoids: Secondary | ICD-10-CM

## 2023-11-02 ENCOUNTER — Ambulatory Visit (HOSPITAL_COMMUNITY)
Admission: RE | Admit: 2023-11-02 | Discharge: 2023-11-02 | Disposition: A | Discharge status: Home / Self Care | Source: Ambulatory Visit | Attending: Family Medicine | Admitting: Family Medicine

## 2023-11-02 DIAGNOSIS — R0609 Other forms of dyspnea: Secondary | ICD-10-CM | POA: Insufficient documentation

## 2023-11-02 LAB — ECHOCARDIOGRAM COMPLETE
AR max vel: 2.78 cm2
AV Area VTI: 2.54 cm2
AV Area mean vel: 2.51 cm2
AV Mean grad: 3 mmHg
AV Peak grad: 5.8 mmHg
Ao pk vel: 1.2 m/s
Area-P 1/2: 4.46 cm2
MV VTI: 2.63 cm2
S' Lateral: 2.9 cm
Single Plane A4C EF: 54.8 %

## 2023-11-06 ENCOUNTER — Ambulatory Visit: Admitting: Physician Assistant

## 2023-11-06 ENCOUNTER — Encounter: Payer: Self-pay | Admitting: Adult Health

## 2023-11-06 ENCOUNTER — Ambulatory Visit: Admitting: Adult Health

## 2023-11-06 VITALS — BP 117/84 | HR 84 | Ht 70.5 in | Wt 232.0 lb

## 2023-11-06 DIAGNOSIS — Z1211 Encounter for screening for malignant neoplasm of colon: Secondary | ICD-10-CM

## 2023-11-06 DIAGNOSIS — Z1331 Encounter for screening for depression: Secondary | ICD-10-CM

## 2023-11-06 DIAGNOSIS — Z01419 Encounter for gynecological examination (general) (routine) without abnormal findings: Secondary | ICD-10-CM

## 2023-11-06 DIAGNOSIS — Z90710 Acquired absence of both cervix and uterus: Secondary | ICD-10-CM

## 2023-11-06 DIAGNOSIS — K649 Unspecified hemorrhoids: Secondary | ICD-10-CM

## 2023-11-06 LAB — HEMOCCULT GUIAC POC 1CARD (OFFICE): Fecal Occult Blood, POC: NEGATIVE

## 2023-11-06 NOTE — Progress Notes (Signed)
 Patient ID: [name], female   DOB: 1961-06-26, 63 y.o.   MRN: [id number] History of Present Illness: [name] is a 63 year old black female, divorced, sp hysterectomy in for a well woman gyn exam. She says she has frequent BM can be soft or hard.  Has headaches and chest pain at times esp in mornings, she said she say her cardiologist last week. And sees Dr Avanell Bob about 2 x a month.  She has Gabriel John, DSS worker with her today.   PCP is Dr Charlotta Cook   Current Medications, Allergies, Past Medical History, Past Surgical History, Family History and Social History were reviewed in [employer] [facility] electronic medical record.     Review of Systems: Patient denies any hearing loss, fatigue, blurred vision, shortness of breath,  abdominal pain, problems with bowel movements, urination, or intercourse(has sex once in a blue moon). No joint pain or mood swings.  See HPI for positives   Physical Exam:BP 117/84 (BP Location: Right Arm, Patient Position: Sitting, Cuff Size: Large)   Pulse 84   Ht 5' 10.5" (1.791 m)   Wt 232 lb (105.2 kg)   BMI 32.82 kg/m   General:  Well developed, well nourished, no acute distress Skin:  Warm and dry Neck:  Midline trachea, normal thyroid , good ROM, no lymphadenopathy Lungs; Clear to auscultation bilaterally Breast:  No dominant palpable mass, retraction, or nipple discharge Cardiovascular: Regular rate and rhythm Abdomen:  Soft, non tender, no hepatosplenomegaly Pelvic:  External genitalia is normal in appearance, no lesions.  The vagina is pale. Urethra has no lesions or masses. The cervix and uterus are absent.  No adnexal masses or tenderness noted.Bladder is non tender, no masses felt. Rectal: Good sphincter tone, no polyps, + hemorrhoids felt.  Hemoccult negative. Extremities/musculoskeletal:  No swelling or varicosities noted, no clubbing or cyanosis Psych:  No mood changes, alert and cooperative,seems happy AA is 0 Fall risk is high, uses rolling walker     11/06/2023   11:00 AM 10/10/2023    1:26 PM 08/17/2023   11:29 AM  Depression screen PHQ 2/9  Decreased Interest 1  3  Down, Depressed, Hopeless 1  3  PHQ - 2 Score 2  6  Altered sleeping 0  3  Tired, decreased energy 1  3  Change in appetite 1  1  Feeling bad or failure about yourself  1  0  Trouble concentrating 1  1  Moving slowly or fidgety/restless 1  0  Suicidal thoughts 0  0  PHQ-9 Score 7  14  Difficult doing work/chores   Somewhat difficult     Information is confidential and restricted. Go to Review Flowsheets to unlock data.   On meds and sees Dr Avanell Bob    11/06/2023   11:01 AM 08/17/2023   11:29 AM 11/03/2022    9:54 AM 09/15/2022   11:37 AM  GAD 7 : Generalized Anxiety Score  Nervous, Anxious, on Edge 1 2 1 2   Control/stop worrying 1 3 3 3   Worry too much - different things 3 2 3 3   Trouble relaxing 1 2 3 3   Restless 2 1 3 3   Easily annoyed or irritable 2 2 3 2   Afraid - awful might happen 1 3 3 2   Total GAD 7 Score 11 15 19 18   Anxiety Difficulty  Somewhat difficult  Somewhat difficult      Upstream - 11/06/23 1055       Pregnancy Intention Screening   Does the  patient want to become pregnant in the next year? N/A    Does the patient's partner want to become pregnant in the next year? N/A    Would the patient like to discuss contraceptive options today? N/A      Contraception Wrap Up   Current Method Female Sterilization   hyst   End Method Female Sterilization   hyst   Contraception Counseling Provided No            Examination chaperoned by Alphonso Aschoff LPN   Impression and Plan: 1. Encounter for well woman exam with routine gynecological exam (Primary) Physical in 2 years Labs with PCP Mammogram was negative 10/09/23 Colonoscopy per GI   2. S/P hysterectomy  3. Hemorrhoids, unspecified hemorrhoid type +hemorrhoids   4. Fecal occult blood screening Hemoccult was negative

## 2023-11-07 ENCOUNTER — Ambulatory Visit (INDEPENDENT_AMBULATORY_CARE_PROVIDER_SITE_OTHER): Payer: 59 | Admitting: Podiatry

## 2023-11-07 ENCOUNTER — Encounter: Payer: Self-pay | Admitting: Podiatry

## 2023-11-07 DIAGNOSIS — M79676 Pain in unspecified toe(s): Secondary | ICD-10-CM

## 2023-11-07 DIAGNOSIS — B351 Tinea unguium: Secondary | ICD-10-CM | POA: Diagnosis not present

## 2023-11-07 DIAGNOSIS — E1142 Type 2 diabetes mellitus with diabetic polyneuropathy: Secondary | ICD-10-CM

## 2023-11-07 NOTE — Progress Notes (Signed)
 This patient returns to my office for at risk foot care.  This patient requires this care by a professional since this patient will be at risk due to having  diabetes.  This patient is unable to cut nails himself since the patient cannot reach his nails.These nails are painful walking and wearing shoes.  This patient presents for at risk foot care today.  General Appearance  Alert, conversant and in no acute stress.  Vascular  Dorsalis pedis and posterior tibial  pulses are palpable  bilaterally.  Capillary return is within normal limits  bilaterally. Temperature is within normal limits  bilaterally.  Neurologic  Senn-Weinstein monofilament wire test within normal limits  bilaterally. Muscle power within normal limits bilaterally.  Nails Thick disfigured discolored nails with subungual debris  from hallux to fifth toes bilaterally. No evidence of bacterial infection or drainage bilaterally.  Orthopedic  No limitations of motion  feet .  No crepitus or effusions noted.  No bony pathology or digital deformities noted.  Skin  normotropic skin with no porokeratosis noted bilaterally.  No signs of infections or ulcers noted.     Onychomycosis  Pain in right toes  Pain in left toes  Consent was obtained for treatment procedures.   Mechanical debridement of nails 1-5  bilaterally performed with a nail nipper.  Filed with dremel without incident.    Return office visit    4  months                  Told patient to return for periodic foot care and evaluation due to potential at risk complications.   Helane Gunther DPM

## 2023-11-12 ENCOUNTER — Encounter: Payer: Self-pay | Admitting: Physician Assistant

## 2023-11-12 ENCOUNTER — Ambulatory Visit (INDEPENDENT_AMBULATORY_CARE_PROVIDER_SITE_OTHER): Admitting: Physician Assistant

## 2023-11-12 VITALS — BP 136/84 | Ht 70.5 in | Wt 232.0 lb

## 2023-11-12 DIAGNOSIS — M25569 Pain in unspecified knee: Secondary | ICD-10-CM

## 2023-11-12 DIAGNOSIS — T63441D Toxic effect of venom of bees, accidental (unintentional), subsequent encounter: Secondary | ICD-10-CM | POA: Diagnosis not present

## 2023-11-12 DIAGNOSIS — R600 Localized edema: Secondary | ICD-10-CM | POA: Diagnosis not present

## 2023-11-12 DIAGNOSIS — T782XXD Anaphylactic shock, unspecified, subsequent encounter: Secondary | ICD-10-CM

## 2023-11-12 DIAGNOSIS — E119 Type 2 diabetes mellitus without complications: Secondary | ICD-10-CM | POA: Diagnosis not present

## 2023-11-12 DIAGNOSIS — T782XXA Anaphylactic shock, unspecified, initial encounter: Secondary | ICD-10-CM | POA: Insufficient documentation

## 2023-11-12 MED ORDER — NAPROXEN 500 MG PO TABS: 500.0000 mg | ORAL_TABLET | Freq: Two times a day (BID) | ORAL | 1 refills | Status: DC

## 2023-11-12 MED ORDER — EPINEPHRINE 0.3 MG/0.3ML IJ SOAJ: 0.3000 mg | INTRAMUSCULAR | 1 refills | Status: AC | PRN

## 2023-11-12 NOTE — Progress Notes (Signed)
 Established Patient Office Visit  Subjective   Patient ID: [name], female    DOB: July 12, 1960  Age: 63 y.o. MRN: [id number]  Chief Complaint  Patient presents with   Follow-up    Problems with legs and jerking Would like script for epipen     Patient presents today for follow up regarding pedal edema. She reports improved symptoms since her last visit with Dr. Geralyn Knee. She denies new or worsening symptoms. She denies chest pain, new shortness of breath, headaches, or visual changes. She relates increased jerking movements but has been seen by her psychiatrist recently and started on Invega  for symptoms. She reports chronic knee pain, but endorses worsening pain today. She is requesting Aleve  for her pain. Patient requesting refill on her EpiPen  today.      Review of Systems  Constitutional:  Negative for fever, malaise/fatigue and weight loss.  Respiratory:  Positive for shortness of breath. Negative for cough and wheezing.   Cardiovascular:  Negative for chest pain and leg swelling.  Musculoskeletal:  Positive for back pain and joint pain. Negative for falls and myalgias.  Neurological:  Positive for focal weakness. Negative for dizziness, tingling and headaches.      Objective:     BP 136/84   Ht 5' 10.5" (1.791 m)   Wt 232 lb (105.2 kg)   BMI 32.82 kg/m    Physical Exam Constitutional:      Appearance: Normal appearance. She is obese.  HENT:     Head: Normocephalic.     Mouth/Throat:     Mouth: Mucous membranes are moist.     Pharynx: Oropharynx is clear.  Eyes:     Extraocular Movements: Extraocular movements intact.     Conjunctiva/sclera: Conjunctivae normal.  Cardiovascular:     Rate and Rhythm: Normal rate and regular rhythm.     Heart sounds: Normal heart sounds. No murmur heard. Pulmonary:     Effort: Pulmonary effort is normal.     Breath sounds: Normal breath sounds. No wheezing or rales.  Musculoskeletal:     Right knee: No swelling, deformity,  effusion or erythema. Tenderness present.     Left knee: No swelling, deformity, effusion or erythema. Tenderness present.     Right lower leg: Edema present.     Left lower leg: Edema present.  Skin:    General: Skin is warm and dry.  Neurological:     General: No focal deficit present.     Mental Status: She is alert and oriented to person, place, and time.  Psychiatric:        Mood and Affect: Mood normal.        Behavior: Behavior normal.     No results found for any visits on 11/12/23.  The ASCVD Risk score (Arnett DK, et al., 2019) failed to calculate for the following reasons:   Risk score cannot be calculated because patient has a medical history suggesting prior/existing ASCVD    Assessment & Plan:   Return in about 4 months (around 03/14/2024).   Pedal edema Assessment & Plan: Patient presents today for follow up regard pedal edema. She reports minor refractory swelling, however overall symptoms have improved. Little to no lower extremity edema today. Advised leg elevation when possible. Patient advised to continue with torsemide  20mg  daily. We discussed low sodium diet.    Arthralgia of lower leg, unspecified laterality Assessment & Plan: Patient presents today with complaints of knee pain. This is a long standing issue. Overall normal physical exam.  She reports taking Aleve  at home for pain relief. When asked about her previous allergy to this medication, patient is unable to recall and endorses taking Aleve  regularly. Medication sent in to her pharmacy for chronic knee pain.   Orders: -     Naproxen ; Take 1 tablet (500 mg total) by mouth 2 (two) times daily with a meal.  Dispense: 60 tablet; Refill: 1  Diabetes mellitus without complication (HCC) -     Hemoglobin A1c  Anaphylactic reaction to bee sting, accidental or unintentional, subsequent encounter -     EPINEPHrine ; Inject 0.3 mg into the muscle as needed for anaphylaxis.  Dispense: 1 each; Refill:  1    Shaena Parkerson, PA-C

## 2023-11-12 NOTE — Assessment & Plan Note (Signed)
 Patient presents today for follow up regard pedal edema. She reports minor refractory swelling, however overall symptoms have improved. Little to no lower extremity edema today. Advised leg elevation when possible. Patient advised to continue with torsemide  20mg  daily. We discussed low sodium diet.

## 2023-11-12 NOTE — Assessment & Plan Note (Signed)
 Patient presents today with complaints of knee pain. This is a long standing issue. Overall normal physical exam. She reports taking Aleve  at home for pain relief. When asked about her previous allergy to this medication, patient is unable to recall and endorses taking Aleve  regularly. Medication sent in to her pharmacy for chronic knee pain.

## 2023-11-13 LAB — HEMOGLOBIN A1C
Est. average glucose Bld gHb Est-mCnc: 134 mg/dL
Hgb A1c MFr Bld: 6.3 % — ABNORMAL HIGH (ref 4.8–5.6)

## 2023-11-20 ENCOUNTER — Other Ambulatory Visit (HOSPITAL_COMMUNITY): Payer: Self-pay | Admitting: Psychiatry

## 2023-11-21 ENCOUNTER — Telehealth (HOSPITAL_COMMUNITY): Payer: Self-pay | Admitting: *Deleted

## 2023-11-21 NOTE — Telephone Encounter (Signed)
 It depends on the reason for the shaking. If its due to side effects (TD) from meds, Ingrezza  shuld work. Ifs from another neurological problem it might not. Recommend she see PCP so see if neuro consult is warranted

## 2023-11-21 NOTE — Telephone Encounter (Signed)
 lmom

## 2023-11-21 NOTE — Telephone Encounter (Signed)
 Bridgette Campus (patient case manager) called stating patient informed her that her Ingrezza  is not working. Per Bridgette Campus, patient is still shaking a lot. Per Bridgette Campus, patient informed her is there something else that can be done. They would like an advice from provider. Bridgette Campus Number is [phone] for provider to call her back.

## 2023-11-22 NOTE — Telephone Encounter (Signed)
 Informed [name] with what provider stated and she agreed and stated she will call them.

## 2023-11-30 ENCOUNTER — Ambulatory Visit: Admitting: Family Medicine

## 2023-12-04 ENCOUNTER — Other Ambulatory Visit (HOSPITAL_COMMUNITY): Payer: Self-pay | Admitting: Psychiatry

## 2023-12-06 NOTE — Telephone Encounter (Signed)
--  spoke with [name] , Child psychotherapist - she states pt is having jerky movements of her hands and legs that are now going on daily - causing her to spill everything and keeping her from activities of daily living. She requests a referral to neurology or someone who can treat condition, please advise---  Copied from CRM [id number]. Topic: Referral - Request for Referral >> Dec 06, 2023  2:26 PM Ivette P wrote: Did the patient discuss referral with their provider in the last year? Yes (If No - schedule appointment) (If Yes - send message)  Appointment offered? No  Type of order/referral and detailed reason for visit: She's been jerking uncontrollably and dr Avanell Bob referred her to see Dr.Luking so he can refer her to neurologist  Preference of office, provider, location: [name] is requesting one in [place] for PT or any recommendation of MD Luking.   If referral order, have you been seen by this specialty before? No (If Yes, this issue or another issue? When? Where?  Can we respond through MyChart? No, Please call pt.   [name] requesting follow up her callback number  [phone]

## 2023-12-07 ENCOUNTER — Telehealth: Payer: Self-pay | Admitting: Family Medicine

## 2023-12-07 NOTE — Telephone Encounter (Signed)
 Nurses Recommend referral to [facility]-Dr.Tat Reason-involuntary movements of the arms and legs Please be aware it could take multiple weeks to get in with this specialist Offer the social worker an appointment with 1 of us  this month if she would like to go ahead and be seen sooner.  At the same time we will go ahead with referral so please let social worker know that as well see message below please  --spoke with Bridgette Campus , Child psychotherapist - she states pt is having jerky movements of her hands and legs that are now going on daily - causing her to spill everything and keeping her from activities of daily living. She requests a referral to neurology or someone who can treat condition, please advise---   Copied from CRM [id number]. Topic: Referral - Request for Referral >> Dec 06, 2023  2:26 PM Ivette P wrote: Did the patient discuss referral with their provider in the last year? Yes

## 2023-12-11 ENCOUNTER — Other Ambulatory Visit: Payer: Self-pay

## 2023-12-11 DIAGNOSIS — R259 Unspecified abnormal involuntary movements: Secondary | ICD-10-CM

## 2023-12-11 NOTE — Telephone Encounter (Signed)
 Spoke with Bridgette Campus and informed her per provider notes and recommendations. She verbalized understanding. Neurology referral placed.

## 2023-12-19 ENCOUNTER — Ambulatory Visit: Payer: 59 | Admitting: Family Medicine

## 2023-12-20 ENCOUNTER — Telehealth: Payer: Self-pay | Admitting: *Deleted

## 2023-12-20 DIAGNOSIS — R259 Unspecified abnormal involuntary movements: Secondary | ICD-10-CM

## 2023-12-20 NOTE — Telephone Encounter (Signed)
 Referral ordered in EPIC.

## 2023-12-20 NOTE — Telephone Encounter (Signed)
 Unfortunately it is simply amazing that specialists can refuse to do a consultation.  We are not asking the specialist to do long-term care or curing the issue but just getting a neurology consultation regarding the tremor-certainly it is possible that the neurologist after seeing the patient may say that this is not a underlying neurologic issue and therefore needs to follow-up with psychiatry-but short of them seeing the neurologist there is no way they can know that with certainty-therefore I am disappointed with the neurology declining to see the patient  May go ahead with new neurology consult try to find a different avenue for the patient Thanks-Dr. Charlotta Cook

## 2023-12-20 NOTE — Telephone Encounter (Signed)
 Copied from CRM [id number]. Topic: General - Other >> Dec 20, 2023  9:49 AM [name] wrote: Reason for CRM: [name] with Social Services called and said neurologist says they're denying seeing her [name] is the one she was referred to, needs help with a new referral please. Callback # [phone]

## 2023-12-21 ENCOUNTER — Other Ambulatory Visit (HOSPITAL_COMMUNITY): Payer: Self-pay | Admitting: Psychiatry

## 2023-12-27 ENCOUNTER — Telehealth: Payer: Self-pay | Admitting: Neurology

## 2023-12-27 NOTE — Telephone Encounter (Signed)
 Was getting a different patient scheduled with social services and she mentioned having a hard time getting this patient in for involuntary movements/jerking. I reviewed chart, sent a message to Dr. Omar Bibber to review to see if we can get patient in. This issue has not been mentioned in any recent notes with Dr. Frederico Jan last time I see shaking discussed with him was back in 2015. She has been seeing behavioral health which this has been discussed and has tried different medications for this Congentin, Inderal , Ingrezza . States nothing has helped. Will call Lue back when I hear back from Dr. Omar Bibber her # is [phone].

## 2024-01-07 ENCOUNTER — Telehealth: Payer: Self-pay | Admitting: *Deleted

## 2024-01-07 NOTE — Telephone Encounter (Signed)
 CALLED PATIENT'S SPONSOR, [name] [name] AND INFORM OF CT FOR 02-01-24- ARRIVAL TIME- 1:15 PM @ [facility], NO RESTRICTIONS TO SCAN, PATIENT TO RECEIVE RESULTS FROM ALISON PERKINS ON 02-11-24 @ 2 PM VIA TELEPHONE, SPOKE WITH MS. [name] AND SHE IS AWARE OF THESE APPTS. AND THE INSTRUCTIONS

## 2024-01-08 ENCOUNTER — Emergency Department (HOSPITAL_COMMUNITY)

## 2024-01-08 ENCOUNTER — Emergency Department (HOSPITAL_COMMUNITY)
Admission: EM | Admit: 2024-01-08 | Discharge: 2024-01-08 | Disposition: A | Discharge status: Home / Self Care | Source: Other Acute Inpatient Hospital | Attending: Emergency Medicine | Admitting: Emergency Medicine

## 2024-01-08 ENCOUNTER — Other Ambulatory Visit: Payer: Self-pay

## 2024-01-08 ENCOUNTER — Encounter (HOSPITAL_COMMUNITY): Payer: Self-pay

## 2024-01-08 ENCOUNTER — Telehealth: Payer: Self-pay | Admitting: *Deleted

## 2024-01-08 DIAGNOSIS — R519 Headache, unspecified: Secondary | ICD-10-CM | POA: Insufficient documentation

## 2024-01-08 DIAGNOSIS — W19XXXA Unspecified fall, initial encounter: Secondary | ICD-10-CM

## 2024-01-08 DIAGNOSIS — M542 Cervicalgia: Secondary | ICD-10-CM | POA: Insufficient documentation

## 2024-01-08 DIAGNOSIS — S8392XA Sprain of unspecified site of left knee, initial encounter: Secondary | ICD-10-CM | POA: Diagnosis not present

## 2024-01-08 DIAGNOSIS — W01190A Fall on same level from slipping, tripping and stumbling with subsequent striking against furniture, initial encounter: Secondary | ICD-10-CM | POA: Insufficient documentation

## 2024-01-08 DIAGNOSIS — R0789 Other chest pain: Secondary | ICD-10-CM | POA: Diagnosis not present

## 2024-01-08 DIAGNOSIS — E119 Type 2 diabetes mellitus without complications: Secondary | ICD-10-CM | POA: Diagnosis not present

## 2024-01-08 DIAGNOSIS — Z8673 Personal history of transient ischemic attack (TIA), and cerebral infarction without residual deficits: Secondary | ICD-10-CM | POA: Insufficient documentation

## 2024-01-08 DIAGNOSIS — Z85118 Personal history of other malignant neoplasm of bronchus and lung: Secondary | ICD-10-CM | POA: Diagnosis not present

## 2024-01-08 DIAGNOSIS — M25562 Pain in left knee: Secondary | ICD-10-CM | POA: Diagnosis present

## 2024-01-08 LAB — COMPREHENSIVE METABOLIC PANEL WITH GFR
ALT: 16 U/L (ref 0–44)
AST: 25 U/L (ref 15–41)
Albumin: 4 g/dL (ref 3.5–5.0)
Alkaline Phosphatase: 79 U/L (ref 38–126)
Anion gap: 11 (ref 5–15)
BUN: 9 mg/dL (ref 8–23)
CO2: 30 mmol/L (ref 22–32)
Calcium: 9.1 mg/dL (ref 8.9–10.3)
Chloride: 97 mmol/L — ABNORMAL LOW (ref 98–111)
Creatinine, Ser: 0.84 mg/dL (ref 0.44–1.00)
GFR, Estimated: >60 mL/min (ref >60)
Glucose, Bld: 98 mg/dL (ref 70–99)
Potassium: 3.5 mmol/L (ref 3.5–5.1)
Sodium: 138 mmol/L (ref 135–145)
Total Bilirubin: 0.5 mg/dL (ref 0.0–1.2)
Total Protein: 7.8 g/dL (ref 6.5–8.1)

## 2024-01-08 LAB — CBC WITH DIFFERENTIAL/PLATELET
Abs Immature Granulocytes: 0.01 10*3/uL (ref 0.00–0.07)
Basophils Absolute: 0 10*3/uL (ref 0.0–0.1)
Basophils Relative: 1 %
Eosinophils Absolute: 0 10*3/uL (ref 0.0–0.5)
Eosinophils Relative: 0 %
HCT: 42.5 % (ref 36.0–46.0)
Hemoglobin: 14.8 g/dL (ref 12.0–15.0)
Immature Granulocytes: 0 %
Lymphocytes Relative: 28 %
Lymphs Abs: 1.2 10*3/uL (ref 0.7–4.0)
MCH: 32.1 pg (ref 26.0–34.0)
MCHC: 34.8 g/dL (ref 30.0–36.0)
MCV: 92.2 fL (ref 80.0–100.0)
Monocytes Absolute: 0.4 10*3/uL (ref 0.1–1.0)
Monocytes Relative: 9 %
Neutro Abs: 2.7 10*3/uL (ref 1.7–7.7)
Neutrophils Relative %: 62 %
Platelets: 163 10*3/uL (ref 150–400)
RBC: 4.61 MIL/uL (ref 3.87–5.11)
RDW: 13.3 % (ref 11.5–15.5)
WBC: 4.4 10*3/uL (ref 4.0–10.5)
nRBC: 0 % (ref 0.0–0.2)

## 2024-01-08 NOTE — ED Triage Notes (Signed)
 Pt arrives from home via [facility]. EMS reports that the patient fell at home 45 minutes PTA. No LOC reported. C-collar in place due complaints of neck pain and hematoma on the head. Pain to the left leg w/ some swelling noted. Pt ambulatory with walker at baseline. Alert and oriented to baseline.

## 2024-01-08 NOTE — ED Notes (Signed)
 Pt ambulated using walker and two people as standby support. Patient ambulates normally with no increased pain.

## 2024-01-08 NOTE — Telephone Encounter (Signed)
 Copied from CRM [id number]. Topic: General - Call Back - No Documentation >> Jan 08, 2024  3:24 PM Geneva B wrote: Reason for CRM: pt social worker jennifer calling wanting to let it be known that patient is in hospital please [phone]

## 2024-01-08 NOTE — Discharge Instructions (Signed)
 Apply ice packs on and off to your knee.  Be sure to use your walker for weightbearing.  Tylenol  every 4 hours as needed for pain.  Please follow-up with your primary care provider later this week for recheck return to the emergency department for any new or worsening symptoms

## 2024-01-08 NOTE — ED Notes (Signed)
 Sister, Avelina, otw to pick up sister

## 2024-01-08 NOTE — ED Provider Notes (Signed)
 [facility] EMERGENCY DEPARTMENT AT [facility] Provider Note   CSN: [id number] Arrival date & time: 01/08/24  1255     Patient presents with: [name] is a 63 y.o. female.    Fall Associated symptoms include chest pain (Right rib pain) and headaches. Pertinent negatives include no abdominal pain and no shortness of breath.       [name] is a 63 y.o. female past medical history of MR, type 2 diabetes, anxiety, prior stroke, lung CA who presents to the Emergency Department via EMS from home for evaluation of fall.  Patient states that she fell prior to arrival while coming out of her bathroom.  States that she struck her head on the door frame causing her to fall.  She was able to get up without assistance.  She attempted to walk into her bedroom and fell again striking her head this time on a dresser and falling on her left knee.  She complains of left-sided and midline neck pain, headache, pain of her right ribs and left knee.  She does not take blood thinners, denies any loss of consciousness.  She ambulates with a walker at baseline.  Denies any chest pain shortness of breath or abdominal pain.  No visual changes, headache or dizziness.  Makeshift cervical collar was applied prior to arrival using a rolled sheet.    Prior to Admission medications   Medication Sig Start Date End Date Taking? Authorizing Provider  albuterol  (PROVENTIL ) (2.5 MG/3ML) 0.083% nebulizer solution Take 3 mLs (2.5 mg total) by nebulization every 6 (six) hours as needed for wheezing or shortness of breath. 06/20/22   Icard, Adine CROME, DO  atorvastatin  (LIPITOR) 10 MG tablet TAKE (1) TABLET BY MOUTH ONCE DAILY. 09/19/23   Alphonsa Glendia LABOR, MD  cycloSPORINE  (RESTASIS ) 0.05 % ophthalmic emulsion Place 1 drop into both eyes 2 (two) times daily.    [provider]  dicyclomine  (BENTYL ) 10 MG capsule TAKE 1 CAPSULE BY MOUTH UP TO 3 TIMES DAILY BEFORE MEALS AS NEEDED FOR ABDOMINAL  CRAMPS/LOOSE STOOLS. 07/26/23   Ezzard Sonny RAMAN, PA-C  doxepin  (SINEQUAN ) 25 MG capsule TAKE (1) CAPSULE BY MOUTH AT BEDTIME. 12/21/23   Okey Barnie SAUNDERS, MD  EPINEPHrine  (EPIPEN  2-PAK) 0.3 mg/0.3 mL IJ SOAJ injection Inject 0.3 mg into the muscle as needed for anaphylaxis. 11/12/23   Grooms, Courtney, PA-C  famotidine  (PEPCID ) 20 MG tablet TAKE (1) TABLET BY MOUTH TWICE DAILY. 07/25/23   Cook, Jayce G, DO  GOODSENSE HEMORRHOIDAL 0.25-88.44 % suppository UNWRAP AND PLACE 1 SUPPOSITORY RECTALLY 2 TIMES DAILY AS NEEDED FOR HEMORRHOIDS/ANAL [facility] 10/30/23   Luking, Scott A, MD  hydrocortisone  (ANUSOL -HC) 2.5 % rectal cream PLACE 1 APPLICATION RECTALLY 2 TIMES DAILY. 10/30/23   Alphonsa Glendia LABOR, MD  INGREZZA  80 MG capsule TAKE (1) TABLET BY MOUTH ONCE DAILY. 12/04/23   Okey Barnie SAUNDERS, MD  lamoTRIgine  (LAMICTAL ) 100 MG tablet Take 1 tablet (100 mg total) by mouth 2 (two) times daily. 10/10/23   Okey Barnie SAUNDERS, MD  naproxen  (NAPROSYN ) 500 MG tablet Take 1 tablet (500 mg total) by mouth 2 (two) times daily with a meal. 11/12/23   Grooms, [place], PA-C  NONFORMULARY OR COMPOUNDED ITEM Hemorrhoid cream    [provider]  paliperidone  (INVEGA ) 9 MG 24 hr tablet Take 1 tablet (9 mg total) by mouth daily. 10/10/23   Okey Barnie SAUNDERS, MD  potassium chloride  (KLOR-CON ) 10 MEQ tablet TAKE 2 TABLETS ( ) BY  MOUTH TWICE DAILY WITH FOOD 09/19/23   Alphonsa Glendia LABOR, MD  propranolol  (INDERAL ) 10 MG tablet TAKE (1) TABLET BY MOUTH THREE TIMES DAILY. 07/25/23   Cook, Jayce G, DO  RABEprazole  (ACIPHEX ) 20 MG tablet TAKE (1) TABLET BY MOUTH TWICE DAILY. 07/26/23   Ezzard Sonny RAMAN, PA-C  senna (SENOKOT) 8.6 MG TABS tablet TAKE (1) TABLET BY MOUTH AT BEDTIME. 05/24/23   Ezzard Sonny RAMAN, PA-C  sertraline  (ZOLOFT ) 100 MG tablet Take 1 tablet (100 mg total) by mouth daily. 10/10/23   Okey Barnie SAUNDERS, MD  torsemide  (DEMADEX ) 20 MG tablet TAKE (1) TABLET BY MOUTH EACH MORNING. 10/22/23   Alphonsa Glendia LABOR, MD  traZODone  (DESYREL ) 100 MG  tablet Take 200 mg by mouth at bedtime as needed. 10/22/23   [provider]  TRELEGY ELLIPTA  100-62.5-25 MCG/ACT AEPB INHALE (1) PUFF INTO THE LUNGS ONCE DAILY. 10/23/23   Cobb, Comer GAILS, NP    Allergies: Thorazine [chlorpromazine hcl], Yellow jacket venom, Acetaminophen , Aspirin, Aspirin-acetaminophen -caffeine, Naproxen , Nsaids, Other, Penicillins, and Tomato    Review of Systems  Constitutional:  Negative for appetite change, chills and fever.  Eyes:  Negative for photophobia.  Respiratory:  Negative for shortness of breath.   Cardiovascular:  Positive for chest pain (Right rib pain).  Gastrointestinal:  Negative for abdominal pain, diarrhea and nausea.  Musculoskeletal:  Positive for arthralgias (Left knee pain,).  Skin:  Negative for color change and wound.  Neurological:  Positive for headaches. Negative for dizziness, syncope and numbness.  Psychiatric/Behavioral:  Negative for confusion.     Updated Vital Signs BP 126/86 (BP Location: Right Wrist)   Pulse 70   Temp 98.1 F (36.7 C) (Oral)   Resp 18   Ht 5' 10 (1.778 m)   Wt 105 kg   SpO2 96%   BMI 33.21 kg/m   Physical Exam Vitals and nursing note reviewed.  Constitutional:      Appearance: Normal appearance. She is not toxic-appearing.  HENT:     Head:     Comments: No hematomas or abrasions of the scalp  Eyes:     Extraocular Movements: Extraocular movements intact.     Conjunctiva/sclera: Conjunctivae normal.     Pupils: Pupils are equal, round, and reactive to light.   Neck:     Trachea: Phonation normal.     Comments: Midline tenderness of the mid cervical spine, also tenderness to the left cervical paraspinal muscles.    Make shift c collar was applied prior to my exam using a rolled sheet Cardiovascular:     Rate and Rhythm: Normal rate and regular rhythm.     Pulses: Normal pulses.  Pulmonary:     Effort: Pulmonary effort is normal.     Breath sounds: Normal breath sounds.  Chest:      Chest wall: Tenderness (ttp right lateral chest wall.  no bony deformity or crepitus) present.  Abdominal:     Palpations: Abdomen is soft.     Tenderness: There is no abdominal tenderness.   Musculoskeletal:        General: Tenderness and signs of injury present.     Cervical back: Tenderness present. Spinous process tenderness and muscular tenderness present.     Left knee: No swelling, effusion or crepitus. Tenderness present. No lateral joint line tenderness. Normal alignment, normal meniscus and normal patellar mobility.   Skin:    General: Skin is warm.     Capillary Refill: Capillary refill takes less than 2 seconds.  Neurological:     General: No focal deficit present.     Mental Status: She is alert.     Sensory: Sensation is intact. No sensory deficit.     Motor: Motor function is intact. No weakness.     Comments: CN II-XII intact.  Speech clear.  No facial droop    (all labs ordered are listed, but only abnormal results are displayed) Labs Reviewed  COMPREHENSIVE METABOLIC PANEL WITH GFR - Abnormal; Notable for the following components:      Result Value   Chloride 97 (*)    All other components within normal limits  CBC WITH DIFFERENTIAL/PLATELET    EKG: None  Radiology: DG Pelvis 1-2 Views Result Date: 01/08/2024 CLINICAL DATA:  Pain after fall. EXAM: PELVIS - 1-2 VIEW COMPARISON:  Reformats from abdominopelvic CT 09/02/2022 FINDINGS: The cortical margins of the bony pelvis are intact. No fracture. Pubic symphysis and sacroiliac joints are congruent. Both femoral heads are well-seated in the respective acetabula. Bilateral hip osteoarthritis, left greater than right. IMPRESSION: 1. No pelvic fracture. 2. Bilateral hip osteoarthritis, left greater than right. Electronically Signed   By: Andrea Gasman M.D.   On: 01/08/2024 16:22   DG Knee Complete 4 Views Left Result Date: 01/08/2024 CLINICAL DATA:  Fall, pain. EXAM: LEFT KNEE - COMPLETE 4+ VIEW COMPARISON:  None  Available. FINDINGS: No fracture or dislocation. Moderate tricompartmental osteoarthritis with osteophytes and joint space narrowing. Small joint effusion but no lipohemarthrosis. No erosive change or focal bone abnormality. IMPRESSION: 1. No fracture or dislocation of the left knee. 2. Moderate tricompartmental osteoarthritis. Electronically Signed   By: Andrea Gasman M.D.   On: 01/08/2024 16:21   DG Ribs Unilateral W/Chest Right Result Date: 01/08/2024 CLINICAL DATA:  Status post fall with rib pain. EXAM: RIGHT RIBS AND CHEST - 3+ VIEW COMPARISON:  Radiograph 04/07/2022, CT 08/01/2013 FINDINGS: No fracture or other bone lesions are seen involving the ribs. There is no pneumothorax or pleural effusion. Fiducial marker with opacity in the right middle lobe, grossly stable, post treatment related changes on CT. Heart size and mediastinal contours are unchanged. Right shoulder arthropathy. IMPRESSION: 1. No right rib fracture or pneumothorax. 2. Post treatment related changes in the right middle lobe. Electronically Signed   By: Andrea Gasman M.D.   On: 01/08/2024 16:20   CT Head Wo Contrast Result Date: 01/08/2024 CLINICAL DATA:  Head trauma, neck trauma, fall at home this afternoon. Neck pain, hematoma on head. EXAM: CT HEAD WITHOUT CONTRAST CT CERVICAL SPINE WITHOUT CONTRAST TECHNIQUE: Multidetector CT imaging of the head and cervical spine was performed following the standard protocol without intravenous contrast. Multiplanar CT image reconstructions of the cervical spine were also generated. RADIATION DOSE REDUCTION: This exam was performed according to the departmental dose-optimization program which includes automated exposure control, adjustment of the mA and/or kV according to patient size and/or use of iterative reconstruction technique. COMPARISON:  MRI head and CT head 03/23/2022. CT cervical spine 03/22/2019. FINDINGS: CT HEAD FINDINGS Brain: No acute intracranial hemorrhage. No CT evidence of  acute infarct. No edema, mass effect, or midline shift. The basilar cisterns are patent. Ventricles: The ventricles are normal. Vascular: Atherosclerotic calcifications of the carotid siphons. No hyperdense vessel. Skull: No acute or aggressive finding. Orbits: Visualized orbits are unremarkable. Sinuses: The visualized paranasal sinuses are clear. Other: Mastoid air cells are clear. CT CERVICAL SPINE FINDINGS Alignment: Straightening and slight reversal of the normal cervical lordosis. No significant listhesis. No facet subluxation or dislocation. Skull  base and vertebrae: No acute fracture. No primary bone lesion or focal pathologic process. Soft tissues and spinal canal: No prevertebral fluid or swelling. No visible canal hematoma. Disc levels: Intervertebral disc space narrowing at multiple levels throughout the cervical spine. Prominent degenerative endplate osteophytes at multiple levels. Disc osteophyte complexes at C3-4 and C4-5 resulting in mild spinal canal stenosis. Additional disc osteophyte complex at C5-6 eccentric to the left resulting in moderate spinal canal stenosis. Facet arthrosis and uncovertebral hypertrophy at multiple levels. Upper chest: No acute finding. Other: None. IMPRESSION: No CT evidence of acute intracranial abnormality. No acute fracture or traumatic malalignment of the cervical spine. Electronically Signed   By: Donnice Mania M.D.   On: 01/08/2024 15:57   CT Cervical Spine Wo Contrast Result Date: 01/08/2024 CLINICAL DATA:  Head trauma, neck trauma, fall at home this afternoon. Neck pain, hematoma on head. EXAM: CT HEAD WITHOUT CONTRAST CT CERVICAL SPINE WITHOUT CONTRAST TECHNIQUE: Multidetector CT imaging of the head and cervical spine was performed following the standard protocol without intravenous contrast. Multiplanar CT image reconstructions of the cervical spine were also generated. RADIATION DOSE REDUCTION: This exam was performed according to the departmental  dose-optimization program which includes automated exposure control, adjustment of the mA and/or kV according to patient size and/or use of iterative reconstruction technique. COMPARISON:  MRI head and CT head 03/23/2022. CT cervical spine 03/22/2019. FINDINGS: CT HEAD FINDINGS Brain: No acute intracranial hemorrhage. No CT evidence of acute infarct. No edema, mass effect, or midline shift. The basilar cisterns are patent. Ventricles: The ventricles are normal. Vascular: Atherosclerotic calcifications of the carotid siphons. No hyperdense vessel. Skull: No acute or aggressive finding. Orbits: Visualized orbits are unremarkable. Sinuses: The visualized paranasal sinuses are clear. Other: Mastoid air cells are clear. CT CERVICAL SPINE FINDINGS Alignment: Straightening and slight reversal of the normal cervical lordosis. No significant listhesis. No facet subluxation or dislocation. Skull base and vertebrae: No acute fracture. No primary bone lesion or focal pathologic process. Soft tissues and spinal canal: No prevertebral fluid or swelling. No visible canal hematoma. Disc levels: Intervertebral disc space narrowing at multiple levels throughout the cervical spine. Prominent degenerative endplate osteophytes at multiple levels. Disc osteophyte complexes at C3-4 and C4-5 resulting in mild spinal canal stenosis. Additional disc osteophyte complex at C5-6 eccentric to the left resulting in moderate spinal canal stenosis. Facet arthrosis and uncovertebral hypertrophy at multiple levels. Upper chest: No acute finding. Other: None. IMPRESSION: No CT evidence of acute intracranial abnormality. No acute fracture or traumatic malalignment of the cervical spine. Electronically Signed   By: Donnice Mania M.D.   On: 01/08/2024 15:57   CT Lumbar Spine Wo Contrast Result Date: 01/08/2024 CLINICAL DATA:  Fall at home. EXAM: CT LUMBAR SPINE WITHOUT CONTRAST TECHNIQUE: Multidetector CT imaging of the lumbar spine was performed  without intravenous contrast administration. Multiplanar CT image reconstructions were also generated. RADIATION DOSE REDUCTION: This exam was performed according to the departmental dose-optimization program which includes automated exposure control, adjustment of the mA and/or kV according to patient size and/or use of iterative reconstruction technique. COMPARISON:  Lumbar spine CT 01/15/2019, MRI 01/07/2020 FINDINGS: Segmentation: 5 lumbar type vertebrae, same numbering system utilized on prior MRI. Alignment: Normal. Vertebrae: No acute fracture. Normal vertebral body heights, no compression deformity. The posterior elements are intact. No pars defects. Paraspinal and other soft tissues: Aortic atherosclerosis. Disc levels: Motion artifact obscures detailed assessment at multiple levels. Disc space narrowing and spurring at multiple levels with broad-based disc bulge.  There is facet hypertrophy at L3-L4 and L4-L5. the combination of disc bulge and facet hypertrophy cause mild spinal canal narrowing at L4-L5. IMPRESSION: 1. No acute fracture or subluxation of the lumbar spine. 2. Multilevel degenerative disc disease and facet hypertrophy. Aortic Atherosclerosis (ICD10-I70.0). Electronically Signed   By: Andrea Gasman M.D.   On: 01/08/2024 15:54     Procedures   Medications Ordered in the ED - No data to display                                  Medical Decision Making Patient brought in by EMS for evaluation of injuries from a fall.  Patient does not take blood thinners.  Struck her head on a door frame.  Denies any loss of consciousness headache or visual change.  She has some lower extremity injury and neck pain.  Patient arrived via EMS with makeshift c-collar applied using rolled sheet.  Patient was placed in hard cervical collar here  Patient ambulates with walker at baseline  Fracture, contusion, sprain, dislocation, concussion all considered  Amount and/or Complexity of Data  Reviewed Labs: ordered.    Details: Labs unremarkable Radiology: ordered.    Details: X-ray of the pelvis without acute finding, x-ray of the left knee without fracture or dislocation, rib film without fracture or pneumothorax  CT head and cervical spine without evidence of intracranial injury or fracture or traumatic malalignment Discussion of management or test interpretation with external provider(s): After review of the imaging results c-collar was removed by me.  Patient neurovascularly intact, mentating well  She is ambulated in the department with a walker without difficulty.  I feel she is appropriate for discharge home, will be discharged in sisters care she is agreeable to symptomatic treatment, given head injury instructions and will follow-up closely outpatient with PCP.  Return precautions were discussed        Final diagnoses:  Fall in home, initial encounter  Sprain of left knee, unspecified ligament, initial encounter    ED Discharge Orders     None          Herlinda Milling, PA-C 01/11/24 1331    Bernard Drivers, MD 01/13/24 1818

## 2024-01-08 NOTE — ED Notes (Signed)
 Contact [facility] for transport. Number added to chart

## 2024-01-09 ENCOUNTER — Encounter (HOSPITAL_COMMUNITY): Payer: Self-pay | Admitting: Psychiatry

## 2024-01-09 ENCOUNTER — Telehealth (HOSPITAL_COMMUNITY): Admitting: Psychiatry

## 2024-01-09 ENCOUNTER — Telehealth: Payer: Self-pay | Admitting: Family Medicine

## 2024-01-09 ENCOUNTER — Other Ambulatory Visit: Payer: Self-pay

## 2024-01-09 DIAGNOSIS — R296 Repeated falls: Secondary | ICD-10-CM

## 2024-01-09 DIAGNOSIS — R29898 Other symptoms and signs involving the musculoskeletal system: Secondary | ICD-10-CM

## 2024-01-09 DIAGNOSIS — F251 Schizoaffective disorder, depressive type: Secondary | ICD-10-CM

## 2024-01-09 DIAGNOSIS — R251 Tremor, unspecified: Secondary | ICD-10-CM

## 2024-01-09 MED ORDER — TRAZODONE HCL 100 MG PO TABS: 200.0000 mg | ORAL_TABLET | Freq: Every evening | ORAL | 2 refills | Status: DC | PRN

## 2024-01-09 MED ORDER — BENZTROPINE MESYLATE 1 MG PO TABS: 1.0000 mg | ORAL_TABLET | Freq: Every day | ORAL | 5 refills | Status: AC

## 2024-01-09 MED ORDER — PALIPERIDONE ER 9 MG PO TB24: 9.0000 mg | ORAL_TABLET | Freq: Every day | ORAL | 5 refills | Status: AC

## 2024-01-09 MED ORDER — AUSTEDO XR 6 MG PO TB24: 6.0000 mg | ORAL_TABLET | Freq: Every day | ORAL | 2 refills | Status: DC

## 2024-01-09 MED ORDER — SERTRALINE HCL 100 MG PO TABS: 100.0000 mg | ORAL_TABLET | Freq: Every day | ORAL | 5 refills | Status: AC

## 2024-01-09 MED ORDER — LAMOTRIGINE 100 MG PO TABS: 100.0000 mg | ORAL_TABLET | Freq: Two times a day (BID) | ORAL | 5 refills | Status: AC

## 2024-01-09 NOTE — Telephone Encounter (Signed)
 Please try to get her in with me relatively soon thank you Please see the note from Dr. Okey below Also please go ahead with referral to [facility] neurology due to leg weakness frequent falls tremors I appreciated thank you-Dr. Glendia Okey, Barnie SAUNDERS, MD  Alphonsa Denise LABOR, MD Hi [name] and her social worker [name] had a phone visit today. As you know, [name] keeps falling and was even in the ED yesterday after falling at home. She complains of twitches and tremors as well as weakness in her legs. I have tried various medicines for tardive dyskinesia, none of which have helped. I'm wondering if she is developing Parkinsonism. [name] states that [facility] wouldn't see her. Could you possible put in a referral to [facility] Neurology? Sometimes they will see pts faster. Thanks, Barnie Okey

## 2024-01-09 NOTE — Telephone Encounter (Signed)
 Referral placed.

## 2024-01-09 NOTE — Progress Notes (Signed)
 Virtual Visit via Telephone Note  I connected with [name] on 01/09/24 at 11:00 AM EDT by telephone and verified that I am speaking with the correct person using two identifiers.  Location: Patient: home Provider: office   I discussed the limitations, risks, security and privacy concerns of performing an evaluation and management service by telephone and the availability of in person appointments. I also discussed with the patient that there may be a patient responsible charge related to this service. The patient expressed understanding and agreed to proceed.       I discussed the assessment and treatment plan with the patient. The patient was provided an opportunity to ask questions and all were answered. The patient agreed with the plan and demonstrated an understanding of the instructions.   The patient was advised to call back or seek an in-person evaluation if the symptoms worsen or if the condition fails to improve as anticipated.  I provided 20 minutes of non-face-to-face time during this encounter.   Barnie Gull, MD  [facility] MD/PA/NP OP Progress Note  01/09/2024 11:20 AM [name]  MRN:  [id number]  Chief Complaint:  Chief Complaint  Patient presents with   Depression   Anxiety   Follow-up   HPI: This patient is a 63 year old divorced black female who lives alone in [place].  She is on disability.  The patient and her social worker Delon return for follow-up by phone after 3 months regarding the patient's schizoaffective disorder.  She is still having a lot of trouble with mobility.  In fact she fell yesterday and was seen in the ED.  All of her x-rays were normal as were her labs.  Her brain CT did not show any abnormalities.  Last time we started Ingrezza  to help with her presumed tardive dyskinesia.  I did not see this but her social worker states that she is having twitching and jerking in her upper extremities and her head.  She claims that her legs twitch  and this is the reason she is falling.  We also tried Cogentin  and propranolol .  We have tried to get her in with [facility] neurology and according to [facility] that I am not willing to see her there.  I will need to speak to her PCP about a referral to a different neurologist.  The patient states that the doxepin  I prescribed does not help her sleep.  She is not very active right now and this may be why she is not sleeping well.  It is hard for her to give an accurate estimate of how much she really is sleeping.  I suggested we go back to the trazodone  for now.  She states she is frustrated with her poor mobility and we need to get to the bottom of this.  However she denies self-harm or auditory or visual hallucinations. Visit Diagnosis:    ICD-10-CM   1. Schizoaffective disorder, depressive type (HCC)  F25.1       Past Psychiatric History: Hospitalization in her younger years, most recently outpatient treatment  Past Medical History:  Past Medical History:  Diagnosis Date   Anxiety    Aortic atherosclerosis (HCC) 09/19/2020   Seen on CAT scan 2021   Arthritis    Depression    Diabetes mellitus    Diabetes mellitus, type II (HCC)    Dyspnea    Dysrhythmia    GERD (gastroesophageal reflux disease)    Heart murmur    HTN (hypertension)  Hyperglycemia    IBS (irritable bowel syndrome)    Lung cancer (HCC)    Lung nodules    right, followed by PCP, PET 11/2011   Mental retardation    MI (myocardial infarction) (HCC)    Migraines    NAUSEA AND VOMITING 06/09/2010   Pneumonia    Sleep apnea    cpap    Stroke [facility])     Past Surgical History:  Procedure Laterality Date   ABDOMINAL HYSTERECTOMY     BIOPSY  01/15/2020   Procedure: BIOPSY;  Surgeon: Shaaron Lamar HERO, MD;  [facility];  Service: Endoscopy;;  gastric   BIOPSY  01/25/2022   Procedure: BIOPSY;  Surgeon: Shaaron Lamar HERO, MD;  [facility];  Service: Endoscopy;;  ascending;descending;sigmoid;    BRONCHIAL BIOPSY  06/14/2021   Procedure: BRONCHIAL BIOPSIES;  Surgeon: Brenna Adine CROME, DO;  Location: [facility];  Service: Pulmonary;;   BRONCHIAL BRUSHINGS  06/14/2021   Procedure: BRONCHIAL BRUSHINGS;  Surgeon: Brenna Adine CROME, DO;  [facility];  Service: Pulmonary;;   BRONCHIAL NEEDLE ASPIRATION BIOPSY  06/14/2021   Procedure: BRONCHIAL NEEDLE ASPIRATION BIOPSIES;  Surgeon: Brenna Adine CROME, DO;  [facility]: [facility];  Service: Pulmonary;;   COLONOSCOPY  11/2007   hyperplastic polyps, prior hx of adenomas    COLONOSCOPY  05/2010   incomplete due to poor prep, hyperplastic rectal polyp   COLONOSCOPY  05/05/2002   Dimunitive polyps in the rectum and left colon, cold    biopsied/removed.  Scattered few left-sided diverticula.  Regular colonic   mucosa appeared normal   COLONOSCOPY N/A 05/28/2013   Rourk: mulitple tubular adenomas removed. next tcs 05/2016   COLONOSCOPY WITH PROPOFOL  N/A 09/07/2016   Dr. Shaaron: For hyperplastic polyps removed. Next colonoscopy March 2023 given history of adenomatous colon polyps in the past.   COLONOSCOPY WITH PROPOFOL  N/A 01/25/2022   Redundant/elongated colon, melanosis coli, left-sided diverticula, multiple colon polyps, colonic lipoma. Negative active colitis, adenomas. 3 year surveillance.   ESOPHAGOGASTRODUODENOSCOPY  08/2007   moderate sized hiatal hernia   ESOPHAGOGASTRODUODENOSCOPY  05/2010   noncritical schatzki ring s/p 78F   ESOPHAGOGASTRODUODENOSCOPY (EGD) WITH ESOPHAGEAL DILATION N/A 02/06/2013   MFM:Wnmfjo esophagus-s/p dilation up to a 58 [place] size with [facility] dilators.  Hiatal hernia   ESOPHAGOGASTRODUODENOSCOPY (EGD) WITH PROPOFOL  N/A 09/07/2016   Dr. Shaaron: Normal, status post empiric dilation of the esophagus for history of dysphagia   ESOPHAGOGASTRODUODENOSCOPY (EGD) WITH PROPOFOL  N/A 05/02/2019   Normal esophagus s/p dilation, normal stomach, normal duodenum   ESOPHAGOGASTRODUODENOSCOPY (EGD) WITH PROPOFOL  N/A  01/15/2020   normal esophagus s/p dilation, gastric nodule s/p biopsy. This showed reactive gastropathy with H.pylori.    EXTERNAL EAR SURGERY     bilateral   FIDUCIAL MARKER PLACEMENT  06/14/2021   Procedure: FIDUCIAL MARKER PLACEMENT;  Surgeon: Brenna Adine CROME, DO;  [facility];  Service: Pulmonary;;   FOOT SURGERY     GLAUCOMA SURGERY     KNEE ARTHROPLASTY Right 11/04/2020   Procedure: COMPUTER ASSISTED TOTAL KNEE ARTHROPLASTY;  Surgeon: Fidel Rogue, MD;  Location: [facility];  Service: Orthopedics;  Laterality: Right;   MALONEY DILATION N/A 09/07/2016   Procedure: AGAPITO DILATION;  Surgeon: Lamar HERO Shaaron, MD;  [facility];  Service: Endoscopy;  Laterality: N/A;   MALONEY DILATION N/A 05/02/2019   Procedure: AGAPITO DILATION;  Surgeon: Shaaron Lamar HERO, MD;  [facility];  Service: Endoscopy;  Laterality: N/A;   MALONEY DILATION N/A 01/15/2020  Procedure: MALONEY DILATION;  Surgeon: Shaaron Lamar HERO, MD;  [facility];  Service: Endoscopy;  Laterality: N/A;   POLYPECTOMY  09/07/2016   Procedure: POLYPECTOMY;  Surgeon: Lamar HERO Shaaron, MD;  [facility];  Service: Endoscopy;;  sigmoid colon x4   POLYPECTOMY  01/25/2022   Procedure: POLYPECTOMY;  Surgeon: Shaaron Lamar HERO, MD;  [facility];  Service: Endoscopy;;  hepatic flexure and descending/sigmoid colon   small bowel capsule  10/2007   normal   VIDEO BRONCHOSCOPY WITH RADIAL ENDOBRONCHIAL ULTRASOUND  06/14/2021   Procedure: VIDEO BRONCHOSCOPY WITH RADIAL ENDOBRONCHIAL ULTRASOUND;  Surgeon: Brenna Adine CROME, DO;  [facility];  Service: Pulmonary;;    Family Psychiatric History: See below  Family History:  Family History  Problem Relation Age of Onset   Heart attack Father    Sleep apnea Father    Stroke Mother    Depression Sister    Schizophrenia Sister    Schizophrenia Other    Drug abuse Other    Alcohol  abuse Other    Colon cancer Other         aunt   Obesity Other    COPD Other    Anxiety disorder Other    GER disease Other    Diabetes type II Other    Anxiety disorder Other    Depression Other    Liver disease Neg Hx    Inflammatory bowel disease Neg Hx     Social History:  Social History   Socioeconomic History   Marital status: Divorced    Spouse name: Not on file   Number of children: Not on file   Years of education: Not on file   Highest education level: High school graduate  Occupational History   Occupation: disabled    Employer: UNEMPLOYED  Tobacco Use   Smoking status: Every Day    Current packs/day: 0.25    Average packs/day: 0.3 packs/day for 40.0 years (10.0 ttl pk-yrs)    Types: Cigarettes    Passive exposure: Current   Smokeless tobacco: Never  Vaping Use   Vaping status: Never Used  Substance and Sexual Activity   Alcohol  use: Yes    Comment: occ   Drug use: No   Sexual activity: Yes    Partners: Male    Birth control/protection: Surgical    Comment: hyst  Other Topics Concern   Not on file  Social History Narrative   Not on file   Social Drivers of Health   Financial Resource Strain: Low Risk  (11/06/2023)   Overall Financial Resource Strain (CARDIA)    Difficulty of Paying Living Expenses: Not very hard  Food Insecurity: Food Insecurity Present (11/06/2023)   Hunger Vital Sign    Worried About Running Out of Food in the Last Year: Sometimes true    Ran Out of Food in the Last Year: Sometimes true  Transportation Needs: No Transportation Needs (11/06/2023)   PRAPARE - Administrator, Civil Service (Medical): No    Lack of Transportation (Non-Medical): No  Physical Activity: Inactive (11/06/2023)   Exercise Vital Sign    Days of Exercise per Week: 0 days    Minutes of Exercise per Session: 0 min  Stress: No Stress Concern Present (11/06/2023)   [employer] of Occupational Health - Occupational Stress Questionnaire    Feeling of Stress : Not at all  Social  Connections: Moderately Isolated (11/06/2023)   Social Connection and Isolation Panel  Frequency of Communication with Friends and Family: More than three times a week    Frequency of Social Gatherings with Friends and Family: Never    Attends Religious Services: Never    Database administrator or Organizations: No    Attends Banker Meetings: Never    Marital Status: Married    Allergies:  Allergies  Allergen Reactions   Thorazine [Chlorpromazine Hcl] Anaphylaxis   Yellow Jacket Venom Anaphylaxis and Swelling   Acetaminophen  Other (See Comments)    Makes pt dizzy   Aspirin Other (See Comments)    seizure   Aspirin-Acetaminophen -Caffeine Other (See Comments)    seizure   Naproxen  Nausea And Vomiting   Nsaids Nausea And Vomiting   Other     Acidic foods   Penicillins Nausea And Vomiting    Has patient had a PCN reaction causing immediate rash, facial/tongue/throat swelling, SOB or lightheadedness with hypotension:Yes Has patient had a PCN reaction causing severe rash involving mucus membranes or skin necrosis:Yes Has patient had a PCN reaction that required hospitalization:Yes Has patient had a PCN reaction occurring within the last 10 years:No If all of the above answers are NO, then may proceed with Cephalosporin use.    Tomato Rash    Metabolic Disorder Labs: Lab Results  Component Value Date   HGBA1C 6.3 (H) 11/12/2023   MPG 126 10/25/2020   No results found for: PROLACTIN Lab Results  Component Value Date   CHOL 114 08/08/2023   TRIG 62 08/08/2023   HDL 54 08/08/2023   CHOLHDL 2.1 08/08/2023   VLDL 21 07/20/2014   LDLCALC 46 08/08/2023   LDLCALC 48 06/28/2022   Lab Results  Component Value Date   TSH 4.492 03/22/2019   TSH 3.180 02/28/2018    Therapeutic Level Labs: No results found for: LITHIUM No results found for: VALPROATE No results found for: CBMZ  Current Medications: Current Outpatient Medications  Medication Sig  Dispense Refill   Deutetrabenazine ER (AUSTEDO XR) 6 MG TB24 Take 6 mg by mouth daily. 30 tablet 2   albuterol  (PROVENTIL ) (2.5 MG/3ML) 0.083% nebulizer solution Take 3 mLs (2.5 mg total) by nebulization every 6 (six) hours as needed for wheezing or shortness of breath. 75 mL 12   atorvastatin  (LIPITOR) 10 MG tablet TAKE (1) TABLET BY MOUTH ONCE DAILY. 30 tablet 5   cycloSPORINE  (RESTASIS ) 0.05 % ophthalmic emulsion Place 1 drop into both eyes 2 (two) times daily.     dicyclomine  (BENTYL ) 10 MG capsule TAKE 1 CAPSULE BY MOUTH UP TO 3 TIMES DAILY BEFORE MEALS AS NEEDED FOR ABDOMINAL CRAMPS/LOOSE STOOLS. 90 capsule 11   EPINEPHrine  (EPIPEN  2-PAK) 0.3 mg/0.3 mL IJ SOAJ injection Inject 0.3 mg into the muscle as needed for anaphylaxis. 1 each 1   famotidine  (PEPCID ) 20 MG tablet TAKE (1) TABLET BY MOUTH TWICE DAILY. 60 tablet 11   GOODSENSE HEMORRHOIDAL 0.25-88.44 % suppository UNWRAP AND PLACE 1 SUPPOSITORY RECTALLY 2 TIMES DAILY AS NEEDED FOR HEMORRHOIDS/ANAL ITCH 12 suppository 4   hydrocortisone  (ANUSOL -HC) 2.5 % rectal cream PLACE 1 APPLICATION RECTALLY 2 TIMES DAILY. 30 g 6   lamoTRIgine  (LAMICTAL ) 100 MG tablet Take 1 tablet (100 mg total) by mouth 2 (two) times daily. 60 tablet 5   naproxen  (NAPROSYN ) 500 MG tablet Take 1 tablet (500 mg total) by mouth 2 (two) times daily with a meal. 60 tablet 1   NONFORMULARY OR COMPOUNDED ITEM Hemorrhoid cream     paliperidone  (INVEGA ) 9 MG 24 hr tablet Take  1 tablet (9 mg total) by mouth daily. 30 tablet 5   potassium chloride  (KLOR-CON ) 10 MEQ tablet TAKE 2 TABLETS ( ) BY MOUTH TWICE DAILY WITH FOOD 120 tablet 4   propranolol  (INDERAL ) 10 MG tablet TAKE (1) TABLET BY MOUTH THREE TIMES DAILY. 90 tablet 11   RABEprazole  (ACIPHEX ) 20 MG tablet TAKE (1) TABLET BY MOUTH TWICE DAILY. 60 tablet 11   senna (SENOKOT) 8.6 MG TABS tablet TAKE (1) TABLET BY MOUTH AT BEDTIME. 30 tablet 5   sertraline  (ZOLOFT ) 100 MG tablet Take 1 tablet (100 mg total) by mouth  daily. 30 tablet 5   torsemide  (DEMADEX ) 20 MG tablet TAKE (1) TABLET BY MOUTH EACH MORNING. 30 tablet 4   traZODone  (DESYREL ) 100 MG tablet Take 2 tablets (200 mg total) by mouth at bedtime as needed. 60 tablet 2   TRELEGY ELLIPTA  100-62.5-25 MCG/ACT AEPB INHALE (1) PUFF INTO THE LUNGS ONCE DAILY. 60 each 4   No current facility-administered medications for this visit.     Musculoskeletal: Strength & Muscle Tone: na Gait & Station: na Patient leans: N/A  Psychiatric Specialty Exam: Review of Systems  Musculoskeletal:  Positive for gait problem.  Neurological:  Positive for tremors and weakness.  Psychiatric/Behavioral:  Positive for sleep disturbance.   All other systems reviewed and are negative.   There were no vitals taken for this visit.There is no height or weight on file to calculate BMI.  General Appearance: NA  Eye Contact:  NA  Speech:  Clear and Coherent  Volume:  Normal  Mood:  Irritable  Affect:  NA  Thought Process:  Goal Directed  Orientation:  Full (Time, Place, and Person)  Thought Content: Rumination   Suicidal Thoughts:  No  Homicidal Thoughts:  No  Memory:  Immediate;   Fair Recent;   Poor Remote;   NA  Judgement:  Impaired  Insight:  Present  Psychomotor Activity:  Decreased and Wide Base  Concentration:  Concentration: Fair and Attention Span: Fair  Recall:  [employer] of Knowledge: Fair  Language: Good  Akathisia:  No  Handed:  Right  AIMS (if indicated): not done  Assets:  Communication Skills Desire for Improvement Resilience Social Support  ADL's:  Intact  Cognition: Impaired,  Mild  Sleep:  Poor   Screenings: GAD-7    Flowsheet Row Office Visit from 11/12/2023 in [facility] Office Visit from 11/06/2023 in [facility] at [facility] Office Visit from 08/17/2023 in [facility] [place] Office Visit from 11/03/2022 in [facility] at  [facility] Office Visit from 09/15/2022 in [facility]  Total GAD-7 Score 12 11 15 19 18    PHQ2-9    Flowsheet Row Office Visit from 11/12/2023 in [facility] Office Visit from 11/06/2023 in [facility] for [facility] at [facility] Office Visit from 10/10/2023 in [facility] [place] Office Visit from 08/17/2023 in [facility] Clinical Support from 07/20/2023 in [facility]  PHQ-2 Total Score 4 2 3 6 1   PHQ-9 Total Score 13 7 12 14 8    Flowsheet Row ED from 01/08/2024 in [facility] Emergency Department at [facility] ED from 10/08/2023 in [facility] at [facility] ED from 09/01/2022 in [facility] Department at [facility]  C-SSRS RISK CATEGORY No Risk No Risk No  Risk     Assessment and Plan: This patient is a 63 year old female with a history of schizoaffective disorder and cognitive impairment.  She may or may not have tardive dyskinesia and it is difficult to tell on a phone visit but she does not feel the Ingrezza  helped.  We will therefore try Austedo XR 6 mg daily.  She will also discontinue doxepin  and go back to trazodone  200 mg at bedtime for sleep.  She will continue Zoloft  100 mg daily for depression, Invega  9 mg daily for schizophrenic symptoms and Lamictal  100 mg twice daily for mood stabilization and Cogentin  to help prevent parkinsonism.  She will return to see me in 3 months  Collaboration of Care: Collaboration of Care: Primary Care Provider AEB I will message her PCP about a referral to a different neurologist.  Patient/Guardian was advised Release of Information must be obtained prior to any record release in order to collaborate their care with an outside provider. Patient/Guardian was advised if they have not already done so to contact the registration department to sign all  necessary forms in order for us  to release information regarding their care.   Consent: Patient/Guardian gives verbal consent for treatment and assignment of benefits for services provided during this visit. Patient/Guardian expressed understanding and agreed to proceed.    Barnie Gull, MD 01/09/2024, 11:20 AM

## 2024-01-09 NOTE — Telephone Encounter (Signed)
 Please see other message regarding this patient she will need an office visit near future and referral-please see other message thank you

## 2024-01-15 ENCOUNTER — Encounter: Payer: Self-pay | Admitting: Nurse Practitioner

## 2024-01-15 ENCOUNTER — Other Ambulatory Visit (HOSPITAL_COMMUNITY): Payer: Self-pay

## 2024-01-15 ENCOUNTER — Ambulatory Visit: Admitting: Nurse Practitioner

## 2024-01-15 VITALS — BP 108/70 | HR 86 | Temp 97.2°F | Ht 70.0 in | Wt 225.6 lb

## 2024-01-15 DIAGNOSIS — M25562 Pain in left knee: Secondary | ICD-10-CM

## 2024-01-15 NOTE — Telephone Encounter (Signed)
 Patient seen in office 01/15/24

## 2024-01-15 NOTE — Telephone Encounter (Signed)
 It appears she has a follow-up with me in September that would be fine

## 2024-01-15 NOTE — Progress Notes (Signed)
   Subjective:    Patient ID: [name], female    DOB: September 12, 1960, 63 y.o.   MRN: [id number]  HPI Presents for complaints of left knee pain after a fall in her home on 01/08/2024.  Patient went to local ED where she had several x-rays and CT scans done.  Her main issue continues to be swelling and pain in the left knee.  2 caseworkers are present today per her request.  Patient lives in an apartment which does have an Engineer, structural.  Uses her walker when she is out in public.  Concerned because she does not use it as much when she is at home although patient states she is doing better about this.  States she fell on her left leg as part of her injuries.  Has had difficulty with her gait due to knee pain and swelling.   Review of Systems  Respiratory:  Negative for cough and chest tightness.   Cardiovascular:  Negative for chest pain.  Musculoskeletal:  Positive for arthralgias, gait problem and joint swelling.       Objective:   Physical Exam Vitals and nursing note reviewed.  Constitutional:      General: She is not in acute distress. Cardiovascular:     Rate and Rhythm: Normal rate and regular rhythm.  Pulmonary:     Effort: Pulmonary effort is normal.     Breath sounds: Normal breath sounds.  Musculoskeletal:     Comments: Significant edema noted around the left knee with mild ballottement.  No erythema or warmth.  No popliteal masses.  Very limited exam of the left knee due to swelling and tenderness.  Able to fully extend the knee but with tenderness.  Uses walker to help with her gait.  Neurological:     Mental Status: She is alert.  Psychiatric:        Mood and Affect: Mood normal.        Behavior: Behavior normal.   EXAM: LEFT KNEE - COMPLETE 4+ VIEW   COMPARISON:  None Available.   FINDINGS: No fracture or dislocation. Moderate tricompartmental osteoarthritis with osteophytes and joint space narrowing. Small joint effusion but no lipohemarthrosis. No erosive change or  focal bone abnormality.   IMPRESSION: 1. No fracture or dislocation of the left knee. 2. Moderate tricompartmental osteoarthritis.     Electronically Signed   By: Andrea Gasman M.D.   On: 01/08/2024 16:21   Today's Vitals   01/15/24 0922  BP: 108/70  Pulse: 86  Temp: (!) 97.2 F (36.2 C)  SpO2: 96%  Weight: 225 lb 9.6 oz (102.3 kg)  Height: 5' 10 (1.778 m)   Body mass index is 32.37 kg/m.         Assessment & Plan:   Problem List Items Addressed This Visit       Other   Acute pain of left knee - Primary   Patient has osteoarthritis and degenerative changes noted on x-rays and CTs from 01/08/2024.  No acute changes were noted on her x-ray at that time.  Patient referred to local [facility] urgent care for evaluation due to the severity of her edema and pain.  Caseworkers will take her there as soon as the visit is complete to see if they can get her in this morning.  Call back here as needed.

## 2024-01-16 ENCOUNTER — Other Ambulatory Visit (HOSPITAL_COMMUNITY): Payer: Self-pay

## 2024-01-17 ENCOUNTER — Other Ambulatory Visit: Payer: Self-pay | Admitting: Gastroenterology

## 2024-01-17 ENCOUNTER — Other Ambulatory Visit: Payer: Self-pay | Admitting: Physician Assistant

## 2024-01-17 DIAGNOSIS — M25569 Pain in unspecified knee: Secondary | ICD-10-CM

## 2024-01-17 NOTE — Telephone Encounter (Signed)
 Alphonsa Glendia LABOR, MD     01/15/24  2:56 PM Note It appears she has a follow-up with me in September that would be fine

## 2024-01-18 ENCOUNTER — Telehealth (HOSPITAL_COMMUNITY): Payer: Self-pay | Admitting: *Deleted

## 2024-01-18 ENCOUNTER — Other Ambulatory Visit: Payer: Self-pay

## 2024-01-18 DIAGNOSIS — M25569 Pain in unspecified knee: Secondary | ICD-10-CM

## 2024-01-18 MED ORDER — NAPROXEN 500 MG PO TABS: 500.0000 mg | ORAL_TABLET | Freq: Two times a day (BID) | ORAL | 1 refills | Status: DC

## 2024-01-18 NOTE — Telephone Encounter (Signed)
 Tried doing a PA for patient Austedo  XR with insurance. Request was denied. Tried to call insurance pharmacy help desk to do an appeal over the phone at [phone]. One rep told staff that it was the wrong number and to call [phone].   That rep from that number told staff to call another number which is [phone]. Staff called that number and a staff informed that plan will pay for Austedo  short acting or Tetrabenazine before they can reconsider the approval for patient Austedo  XR. Or provider will need to write a letter and send medical information as to why this particular medication XR version is a medically necessity for this medication over the medication that is on patient formulary.    The Fax number to send this to is [phone].

## 2024-01-21 ENCOUNTER — Other Ambulatory Visit (HOSPITAL_COMMUNITY): Payer: Self-pay | Admitting: Psychiatry

## 2024-01-21 ENCOUNTER — Other Ambulatory Visit: Payer: Self-pay | Admitting: Family Medicine

## 2024-01-21 DIAGNOSIS — K649 Unspecified hemorrhoids: Secondary | ICD-10-CM

## 2024-01-21 MED ORDER — AUSTEDO 6 MG PO TABS: 6.0000 mg | ORAL_TABLET | ORAL | 2 refills | Status: DC

## 2024-01-21 NOTE — Telephone Encounter (Signed)
 Pharmacy aware and will go ahead and fill medication for patient

## 2024-01-21 NOTE — Telephone Encounter (Signed)
 Austedo  short acting sent in

## 2024-01-23 ENCOUNTER — Other Ambulatory Visit: Payer: Self-pay

## 2024-01-23 DIAGNOSIS — R296 Repeated falls: Secondary | ICD-10-CM

## 2024-01-23 DIAGNOSIS — R259 Unspecified abnormal involuntary movements: Secondary | ICD-10-CM

## 2024-01-23 DIAGNOSIS — R251 Tremor, unspecified: Secondary | ICD-10-CM

## 2024-01-23 DIAGNOSIS — M25562 Pain in left knee: Secondary | ICD-10-CM

## 2024-01-23 DIAGNOSIS — R29898 Other symptoms and signs involving the musculoskeletal system: Secondary | ICD-10-CM

## 2024-02-01 ENCOUNTER — Ambulatory Visit (HOSPITAL_COMMUNITY)
Admission: RE | Admit: 2024-02-01 | Discharge: 2024-02-01 | Disposition: A | Discharge status: Home / Self Care | Source: Ambulatory Visit | Attending: Radiation Oncology | Admitting: Radiation Oncology

## 2024-02-01 DIAGNOSIS — C342 Malignant neoplasm of middle lobe, bronchus or lung: Secondary | ICD-10-CM | POA: Insufficient documentation

## 2024-02-01 MED ORDER — IOHEXOL 300 MG/ML  SOLN
80.0000 mL | Freq: Once | INTRAMUSCULAR | Status: AC | PRN
  Administered 2024-02-01: 80 mL via INTRAVENOUS

## 2024-02-11 ENCOUNTER — Ambulatory Visit
Admission: RE | Admit: 2024-02-11 | Discharge: 2024-02-11 | Disposition: A | Discharge status: Home / Self Care | Payer: 59 | Source: Ambulatory Visit | Attending: Radiation Oncology | Admitting: Radiation Oncology

## 2024-02-11 DIAGNOSIS — C342 Malignant neoplasm of middle lobe, bronchus or lung: Secondary | ICD-10-CM

## 2024-02-11 NOTE — Progress Notes (Signed)
 Radiation Oncology         [phone] ________________________________  Outpatient Follow Up - Conducted via telephone at caregiver request.  I spoke with the patient's social worker to conduct this visit via telephone. The patient's social worker was notified in advance and was offered an in person or telemedicine meeting to allow for face to face communication but instead preferred to proceed with a telephone visit.  Name: [name]        MRN: [id number]  Date of Service: 02/11/2024 DOB: 28-Sep-1960  RR:Olxpwh, Glendia LABOR, MD  Brenna Adine CROME, DO     REFERRING PHYSICIAN: Brenna Adine CROME, DO   DIAGNOSIS: The encounter diagnosis was Primary malignant neoplasm of right middle lobe of lung (HCC).   HISTORY OF PRESENT ILLNESS: [name] is a 63 y.o. female with a carcinoid tumor in the right lung.  The patient has been followed for many years with what was felt to be a benign nodule in the right lung.  She had a CT chest without contrast on 03/04/2021 that showed a nodule in the RML measuring up to 2 cm in close proximity to the pulmonary artery branch.  She underwent a PET scan on 04/01/2021 that showed hypermetabolism with an SUV of 7.1 and this nodule, to rule out AVM in this location, she did also have a CT angiography scan of the chest on 05/26/2021 and there was no post contrast enhancement to suggest vascular anomaly.  She did undergo bronchoscopy on 06/14/2021 cytology from that procedure with Dr.Icard showed neoplastic cells in the fine-needle aspirate consistent with neuroendocrine tumor similar findings were seen in the brushing.  She was offered surgical resection but is interested in less invasive approach.    Rather, she went on to receive stereotactic body radiotherapy (SBRT) which she completed in February 2023.  Since, she has been followed in surveillance. She's had delays in her care due to insect infestation in her home, However she has a legal guardian appointed by the state  who has helped manage her needs at home. The paitent's most recent CT chest on 02/01/24 showed post treatment changes in the RML consistent with architectural distortion and evidence of radiation scarring effects. No new nodules were noted, and she continues to have evidence of atherosclerotic vascular changes.  Her social worker Rosaline was contacted to communicate these results as has been the protocol since she established care.     PREVIOUS RADIATION THERAPY:   08/01/21-08/16/21: The tumor in the RML was treated with a course of stereotactic body radiation treatment. The patient received 50 Gy In 5 fractions at 10 Gy per fraction.   PAST MEDICAL HISTORY:  Past Medical History:  Diagnosis Date   Anxiety    Aortic atherosclerosis (HCC) 09/19/2020   Seen on CAT scan 2021   Arthritis    Depression    Diabetes mellitus    Diabetes mellitus, type II (HCC)    Dyspnea    Dysrhythmia    GERD (gastroesophageal reflux disease)    Heart murmur    HTN (hypertension)    Hyperglycemia    IBS (irritable bowel syndrome)    Lung cancer (HCC)    Lung nodules    right, followed by PCP, PET 11/2011   Mental retardation    MI (myocardial infarction) (HCC)    Migraines    NAUSEA AND VOMITING 06/09/2010   Pneumonia    Sleep apnea    cpap    Stroke (HCC)  PAST SURGICAL HISTORY: Past Surgical History:  Procedure Laterality Date   ABDOMINAL HYSTERECTOMY     BIOPSY  01/15/2020   Procedure: BIOPSY;  Surgeon: Shaaron Lamar HERO, MD;  [facility]: [facility];  Service: Endoscopy;;  gastric   BIOPSY  01/25/2022   Procedure: BIOPSY;  Surgeon: Shaaron Lamar HERO, MD;  [facility]: [facility];  Service: Endoscopy;;  ascending;descending;sigmoid;   BRONCHIAL BIOPSY  06/14/2021   Procedure: BRONCHIAL BIOPSIES;  Surgeon: Brenna Adine CROME, DO;  Location: [facility];  Service: Pulmonary;;   BRONCHIAL BRUSHINGS  06/14/2021   Procedure: BRONCHIAL BRUSHINGS;  Surgeon: Brenna Adine CROME, DO;  Location: [facility];  Service: Pulmonary;;   BRONCHIAL NEEDLE ASPIRATION BIOPSY  06/14/2021   Procedure: BRONCHIAL NEEDLE ASPIRATION BIOPSIES;  Surgeon: Brenna Adine CROME, DO;  Location: [facility];  Service: Pulmonary;;   COLONOSCOPY  11/2007   hyperplastic polyps, prior hx of adenomas    COLONOSCOPY  05/2010   incomplete due to poor prep, hyperplastic rectal polyp   COLONOSCOPY  05/05/2002   Dimunitive polyps in the rectum and left colon, cold    biopsied/removed.  Scattered few left-sided diverticula.  Regular colonic   mucosa appeared normal   COLONOSCOPY N/A 05/28/2013   Rourk: mulitple tubular adenomas removed. next tcs 05/2016   COLONOSCOPY WITH PROPOFOL  N/A 09/07/2016   Dr. Shaaron: For hyperplastic polyps removed. Next colonoscopy March 2023 given history of adenomatous colon polyps in the past.   COLONOSCOPY WITH PROPOFOL  N/A 01/25/2022   Redundant/elongated colon, melanosis coli, left-sided diverticula, multiple colon polyps, colonic lipoma. Negative active colitis, adenomas. 3 year surveillance.   ESOPHAGOGASTRODUODENOSCOPY  08/2007   moderate sized hiatal hernia   ESOPHAGOGASTRODUODENOSCOPY  05/2010   noncritical schatzki ring s/p 69F   ESOPHAGOGASTRODUODENOSCOPY (EGD) WITH ESOPHAGEAL DILATION N/A 02/06/2013   MFM:Wnmfjo esophagus-s/p dilation up to a 58 [place] size with [facility] dilators.  Hiatal hernia   ESOPHAGOGASTRODUODENOSCOPY (EGD) WITH PROPOFOL  N/A 09/07/2016   Dr. Shaaron: Normal, status post empiric dilation of the esophagus for history of dysphagia   ESOPHAGOGASTRODUODENOSCOPY (EGD) WITH PROPOFOL  N/A 05/02/2019   Normal esophagus s/p dilation, normal stomach, normal duodenum   ESOPHAGOGASTRODUODENOSCOPY (EGD) WITH PROPOFOL  N/A 01/15/2020   normal esophagus s/p dilation, gastric nodule s/p biopsy. This showed reactive gastropathy with H.pylori.    EXTERNAL EAR SURGERY     bilateral   FIDUCIAL MARKER PLACEMENT  06/14/2021   Procedure: FIDUCIAL MARKER PLACEMENT;  Surgeon: Brenna Adine CROME, DO;  Location: [facility];  Service: Pulmonary;;   FOOT SURGERY     GLAUCOMA SURGERY     KNEE ARTHROPLASTY Right 11/04/2020   Procedure: COMPUTER ASSISTED TOTAL KNEE ARTHROPLASTY;  Surgeon: Fidel Rogue, MD;  Location: [facility];  Service: Orthopedics;  Laterality: Right;   MALONEY DILATION N/A 09/07/2016   Procedure: AGAPITO DILATION;  Surgeon: Lamar HERO Shaaron, MD;  [facility];  Service: Endoscopy;  Laterality: N/A;   MALONEY DILATION N/A 05/02/2019   Procedure: AGAPITO DILATION;  Surgeon: Shaaron Lamar HERO, MD;  [facility];  Service: Endoscopy;  Laterality: N/A;   MALONEY DILATION N/A 01/15/2020   Procedure: AGAPITO DILATION;  Surgeon: Shaaron Lamar HERO, MD;  [facility];  Service: Endoscopy;  Laterality: N/A;   POLYPECTOMY  09/07/2016   Procedure: POLYPECTOMY;  Surgeon: Lamar HERO Shaaron, MD;  [facility];  Service: Endoscopy;;  sigmoid colon x4   POLYPECTOMY  01/25/2022   Procedure: POLYPECTOMY;  Surgeon: Shaaron Lamar HERO, MD;  [facility];  Service: Endoscopy;;  hepatic flexure and descending/sigmoid colon   small bowel capsule  10/2007   normal   VIDEO BRONCHOSCOPY WITH RADIAL ENDOBRONCHIAL ULTRASOUND  06/14/2021   Procedure: VIDEO BRONCHOSCOPY WITH RADIAL ENDOBRONCHIAL ULTRASOUND;  Surgeon: Brenna Adine CROME, DO;  [facility];  Service: Pulmonary;;     FAMILY HISTORY:  Family History  Problem Relation Age of Onset   Heart attack Father    Sleep apnea Father    Stroke Mother    Depression Sister    Schizophrenia Sister    Schizophrenia Other    Drug abuse Other    Alcohol  abuse Other    Colon cancer Other        aunt   Obesity Other    COPD Other    Anxiety disorder Other    GER disease Other    Diabetes type II Other    Anxiety disorder Other    Depression Other    Liver disease Neg Hx    Inflammatory bowel disease Neg Hx      SOCIAL HISTORY:  reports that she has been smoking cigarettes.  She has a 10 pack-year smoking history. She has been exposed to tobacco smoke. She has never used smokeless tobacco. She reports current alcohol  use. She reports that she does not use drugs.The patient is divorced and lives independently but has a social workers who are responsible for her and are caregivers.  She has an in-home caregiver who visits with her most days.   ALLERGIES: Thorazine [chlorpromazine hcl], Yellow jacket venom, Acetaminophen , Aspirin, Aspirin-acetaminophen -caffeine, Naproxen , Nsaids, Other, Penicillins, and Tomato   MEDICATIONS:  Current Outpatient Medications  Medication Sig Dispense Refill   albuterol  (PROVENTIL ) (2.5 MG/3ML) 0.083% nebulizer solution Take 3 mLs (2.5 mg total) by nebulization every 6 (six) hours as needed for wheezing or shortness of breath. 75 mL 12   atorvastatin  (LIPITOR) 10 MG tablet TAKE (1) TABLET BY MOUTH ONCE DAILY. 30 tablet 5   benztropine  (COGENTIN ) 1 MG tablet Take 1 tablet (1 mg total) by mouth daily. 30 tablet 5   cycloSPORINE  (RESTASIS ) 0.05 % ophthalmic emulsion Place 1 drop into both eyes 2 (two) times daily.     Deutetrabenazine  (AUSTEDO ) 6 MG TABS Take 6 mg by mouth every morning. 30 tablet 2   dicyclomine  (BENTYL ) 10 MG capsule TAKE 1 CAPSULE BY MOUTH UP TO 3 TIMES DAILY BEFORE MEALS AS NEEDED FOR ABDOMINAL CRAMPS/LOOSE STOOLS. 90 capsule 11   EPINEPHrine  (EPIPEN  2-PAK) 0.3 mg/0.3 mL IJ SOAJ injection Inject 0.3 mg into the muscle as needed for anaphylaxis. 1 each 1   famotidine  (PEPCID ) 20 MG tablet TAKE (1) TABLET BY MOUTH TWICE DAILY. 60 tablet 11   GOODSENSE HEMORRHOIDAL 0.25-88.44 % suppository UNWRAP AND PLACE 1 SUPPOSITORY RECTALLY 2 TIMES DAILY AS NEEDED FOR HEMORRHOIDS/ANAL ITCH 12 suppository 11   hydrocortisone  (ANUSOL -HC) 2.5 % rectal cream PLACE 1 APPLICATION RECTALLY 2 TIMES DAILY. 30 g 6   lamoTRIgine  (LAMICTAL ) 100 MG tablet Take 1 tablet (100 mg total) by mouth 2 (two) times daily. 60 tablet 5   naproxen  (NAPROSYN ) 500  MG tablet Take 1 tablet (500 mg total) by mouth 2 (two) times daily with a meal. 60 tablet 1   NONFORMULARY OR COMPOUNDED ITEM Hemorrhoid cream     paliperidone  (INVEGA ) 9 MG 24 hr tablet Take 1 tablet (9 mg total) by mouth daily. 30 tablet 5   potassium chloride  (KLOR-CON ) 10 MEQ tablet TAKE 2 TABLETS ( ) BY MOUTH TWICE DAILY WITH FOOD 120  tablet 4   propranolol  (INDERAL ) 10 MG tablet TAKE (1) TABLET BY MOUTH THREE TIMES DAILY. 90 tablet 11   RABEprazole  (ACIPHEX ) 20 MG tablet TAKE (1) TABLET BY MOUTH TWICE DAILY. 60 tablet 11   SENNA-TABS 8.6 MG tablet TAKE (1) TABLET BY MOUTH AT BEDTIME. 30 tablet 4   sertraline  (ZOLOFT ) 100 MG tablet Take 1 tablet (100 mg total) by mouth daily. 30 tablet 5   torsemide  (DEMADEX ) 20 MG tablet TAKE (1) TABLET BY MOUTH EACH MORNING. 30 tablet 4   traZODone  (DESYREL ) 100 MG tablet Take 2 tablets (200 mg total) by mouth at bedtime as needed. 60 tablet 2   TRELEGY ELLIPTA  100-62.5-25 MCG/ACT AEPB INHALE (1) PUFF INTO THE LUNGS ONCE DAILY. 60 each 4   No current facility-administered medications for this visit.     REVIEW OF SYSTEMS: On review of systems, the patient's social worker Rosaline will take over as her primary advocate, as Delon who also had been involved is retiring. They state the patient is doing well overall at home without any known complaints of chest pain or shortness of breath, recent infections or other complaints.  PHYSICAL EXAM:  Unable to assess given encounter type  ECOG = 0  0 - Asymptomatic (Fully active, able to carry on all predisease activities without restriction)  1 - Symptomatic but completely ambulatory (Restricted in physically strenuous activity but ambulatory and able to carry out work of a light or sedentary nature. For example, light housework, office work)  2 - Symptomatic, <50% in bed during the day (Ambulatory and capable of all self care but unable to carry out any work activities. Up and about more than 50% of  waking hours)  3 - Symptomatic, >50% in bed, but not bedbound (Capable of only limited self-care, confined to bed or chair 50% or more of waking hours)  4 - Bedbound (Completely disabled. Cannot carry on any self-care. Totally confined to bed or chair)  5 - Death   Raylene MM, Creech RH, Tormey DC, et al. [phone]). Toxicity and response criteria of the [facility]. Am. DOROTHA Bridges. Oncol. 5 (6): 649-55    LABORATORY DATA:  Lab Results  Component Value Date   WBC 4.4 01/08/2024   HGB 14.8 01/08/2024   HCT 42.5 01/08/2024   MCV 92.2 01/08/2024   PLT 163 01/08/2024   Lab Results  Component Value Date   NA 138 01/08/2024   K 3.5 01/08/2024   CL 97 (L) 01/08/2024   CO2 30 01/08/2024   Lab Results  Component Value Date   ALT 16 01/08/2024   AST 25 01/08/2024   ALKPHOS 79 01/08/2024   BILITOT 0.5 01/08/2024      RADIOGRAPHY: CT CHEST W CONTRAST Result Date: 02/02/2024 CLINICAL DATA:  Non-small cell lung cancer, monitor status post SB RT. * Tracking Code: BO * EXAM: CT CHEST WITH CONTRAST TECHNIQUE: Multidetector CT imaging of the chest was performed during intravenous contrast administration. RADIATION DOSE REDUCTION: This exam was performed according to the departmental dose-optimization program which includes automated exposure control, adjustment of the mA and/or kV according to patient size and/or use of iterative reconstruction technique. CONTRAST:  80mL OMNIPAQUE  IOHEXOL  300 MG/ML  SOLN COMPARISON:  Multiple priors including CT July 23, 2023 FINDINGS: Cardiovascular: Aortic atherosclerosis. Coronary artery calcifications. Normal size heart. No significant pericardial effusion/thickening. Mediastinum/Nodes: No suspicious thyroid  nodule. No pathologically enlarged mediastinal, hilar or axillary lymph nodes. The esophagus is grossly unremarkable. Lungs/Pleura: Similar post treatment change in  the right middle lobe with radiation fibrosis and architectural  distortion. No new suspicious nodularity in the treatment bed. No new suspicious pulmonary nodules or masses. Scattered atelectasis/scarring. No pleural effusion. No pneumothorax. Upper Abdomen: No acute abnormality. Musculoskeletal: No aggressive lytic or blastic lesion of bone. Probable post radiation change in the midthoracic spine. Multilevel degenerative change of the spine. Degenerative change of the bilateral shoulders. IMPRESSION: 1. Similar post treatment change in the right middle lobe with radiation fibrosis and architectural distortion. No new suspicious nodularity in the treatment bed. 2. No evidence of metastatic disease in the chest. 3. Aortic atherosclerosis. Aortic Atherosclerosis (ICD10-I70.0). Electronically Signed   By: Reyes Holder M.D.   On: 02/02/2024 13:03       IMPRESSION/PLAN: 1. Carcinoid tumor in the right middle lobe.  I spoke with the patient's social workers today and the patient's scans continue to be reassuring without concern of recurrence or new disease. We will plan to repeat her next scan in 6 months per NCCN guidelines and follow up with her team. They will communicate these results to the patient as well.   This encounter was conducted via telephone.  The patient has provided two factor identification and has given verbal consent for this type of encounter and has been advised to only accept a meeting of this type in a secure network environment. The time spent during this encounter was 35 minutes including preparation, discussion, and coordination of the patient's care. The attendants for this meeting include Donald Estefana Husband and Rosaline Bullins and Delon Jock. During the encounter,  Donald Estefana Husband was located at [facility] Radiation Oncology Department.  [name] Ada was located at home and not a part of the discussion but her Case Manager Rosaline Bar and Delon Jock were at work.      Donald KYM Husband,  [facility]   **Disclaimer: This note was dictated with voice recognition software. Similar sounding words can inadvertently be transcribed and this note may contain transcription errors which may not have been corrected upon publication of note.**

## 2024-02-14 ENCOUNTER — Other Ambulatory Visit: Payer: Self-pay | Admitting: Family Medicine

## 2024-02-14 DIAGNOSIS — M25569 Pain in unspecified knee: Secondary | ICD-10-CM

## 2024-02-21 ENCOUNTER — Ambulatory Visit: Admitting: Adult Health

## 2024-02-21 ENCOUNTER — Other Ambulatory Visit (HOSPITAL_COMMUNITY)
Admission: RE | Admit: 2024-02-21 | Discharge: 2024-02-21 | Disposition: A | Discharge status: Home / Self Care | Source: Ambulatory Visit | Attending: Adult Health | Admitting: Adult Health

## 2024-02-21 ENCOUNTER — Encounter: Payer: Self-pay | Admitting: Adult Health

## 2024-02-21 VITALS — BP 114/77 | HR 68 | Ht 70.5 in | Wt 230.0 lb

## 2024-02-21 DIAGNOSIS — Z90710 Acquired absence of both cervix and uterus: Secondary | ICD-10-CM | POA: Diagnosis not present

## 2024-02-21 DIAGNOSIS — Z113 Encounter for screening for infections with a predominantly sexual mode of transmission: Secondary | ICD-10-CM | POA: Insufficient documentation

## 2024-02-21 NOTE — Progress Notes (Signed)
  Subjective:     Patient ID: [name] [name], female   DOB: 08/17/1960, 63 y.o.   MRN: [id number]  HPI Goddess is a 63 year old black female, divorced, sp hysterectomy in requesting STD testing, she is having sex again.  PCP is Dr Alphonsa  Review of Systems Denies any vaginal discharge or itching Reviewed past medical,surgical, social and family history. Reviewed medications and allergies.     Objective:   Physical Exam BP 114/77 (BP Location: Right Arm, Patient Position: Sitting, Cuff Size: Large)   Pulse 68   Ht 5' 10.5 (1.791 m)   Wt 230 lb (104.3 kg)   BMI 32.54 kg/m     Skin warm and dry.Pelvic: external genitalia is normal in appearance no lesions, vagina: pale, no odor,urethra has no lesions or masses noted, cervix and uterus are absent, adnexa: no masses or tenderness noted. Bladder is non tender and no masses felt. Her feet are swollen and she is using a walker, she says she has been eating salt.  Fall risk is low  Upstream - 02/21/24 1116       Pregnancy Intention Screening   Does the patient want to become pregnant in the next year? N/A    Does the patient's partner want to become pregnant in the next year? N/A    Would the patient like to discuss contraceptive options today? N/A      Contraception Wrap Up   Current Method Female Sterilization   hyst   End Method Female Sterilization   hyst   Contraception Counseling Provided No          Assessment:     1. Screening examination for STD (sexually transmitted disease) (Primary) CV swab sent for GC/CHL,trich, BV and yeast Will check HIV and RPR  - Cervicovaginal ancillary only( ) - HIV Antibody (routine testing w rflx) - RPR  2. S/P hysterectomy    Plan:     Follow up prn

## 2024-02-22 ENCOUNTER — Ambulatory Visit: Payer: Self-pay | Admitting: Adult Health

## 2024-02-22 DIAGNOSIS — A749 Chlamydial infection, unspecified: Secondary | ICD-10-CM

## 2024-02-22 LAB — HIV ANTIBODY (ROUTINE TESTING W REFLEX): HIV Screen 4th Generation wRfx: NONREACTIVE

## 2024-02-22 LAB — RPR: RPR Ser Ql: NONREACTIVE

## 2024-02-25 LAB — CERVICOVAGINAL ANCILLARY ONLY
Bacterial Vaginitis (gardnerella): POSITIVE — AB
Candida Glabrata: NEGATIVE
Candida Vaginitis: NEGATIVE
Chlamydia: POSITIVE — AB
Comment: NEGATIVE
Comment: NEGATIVE
Comment: NEGATIVE
Comment: NEGATIVE
Comment: NEGATIVE
Comment: NORMAL
Neisseria Gonorrhea: NEGATIVE
Trichomonas: NEGATIVE

## 2024-02-26 ENCOUNTER — Ambulatory Visit (INDEPENDENT_AMBULATORY_CARE_PROVIDER_SITE_OTHER): Admitting: Neurology

## 2024-02-26 ENCOUNTER — Encounter: Payer: Self-pay | Admitting: Neurology

## 2024-02-26 VITALS — BP 120/68 | HR 64 | Ht 70.5 in | Wt 236.0 lb

## 2024-02-26 DIAGNOSIS — G4734 Idiopathic sleep related nonobstructive alveolar hypoventilation: Secondary | ICD-10-CM | POA: Diagnosis not present

## 2024-02-26 DIAGNOSIS — R259 Unspecified abnormal involuntary movements: Secondary | ICD-10-CM

## 2024-02-26 DIAGNOSIS — G4733 Obstructive sleep apnea (adult) (pediatric): Secondary | ICD-10-CM | POA: Diagnosis not present

## 2024-02-26 NOTE — Progress Notes (Signed)
 Subjective:    Patient ID: [name] is a 63 y.o. female.  HPI    True Mar, MD, PhD [facility] [address], [address] [address] [place], [place] [address]  Dear Dr. Alphonsa,   I saw your patient, [name], upon your kind request in my neurologic clinic today for initial consultation of her abnormal involuntary movements.  The patient is accompanied by her social worker today.  As you know, Ms. Dapolito is a 63 year old female with an underlying complex medical history of mood disorder, hypertension, heart murmur, reflux disease, history of MI, migraine headaches, sleep apnea, history of stroke, irritable bowel syndrome, diabetes and obesity, who reports involuntary movements for several months.  She reports having involuntary jerking and tremors.  She feels that symptoms are constant but also admits that since starting Austedo  her symptoms are better.  She has been on Ingrezza  but is currently no longer on it.  She has been on Cogentin .  She is on multiple other psychotropic medications including Lamictal , Invega , sertraline , trazodone .   I had evaluated her for sleep apnea concern in the past but she has not been on PAP therapy.   Of note, she had a baseline sleep study through our sleep lab in March 2024 which showed moderate to severe obstructive sleep apnea, with a total AHI of 21.1/hour, REM AHI of 58.2/hour, supine AHI of 10.3/hour and O2 nadir of 72% (during  non-supine REM sleep). There was significant time below or at 88% saturation of over 120 minutes for the night, indicating nocturnal hypoxemia, average oxygen saturation was only 90% for the night.  We could not contact her to start CPAP therapy at home or AutoPap therapy.  She would be willing to start CPAP therapy.  She reports that she never got a machine.  She still smokes about half a pack per day and is currently not working on smoking cessation.  She does not currently have a pulmonologist. She  has significant lower extremity edema. She drinks caffeine in the form of coffee, 2 cups in the morning, no alcohol .  Previously:   07/18/2022: 63 year old female with an underlying complex medical history of aortic atherosclerosis, hypertension, hyperlipidemia, coronary artery disease with history of MI, diabetes, reflux disease, anxiety, depression, irritable bowel syndrome, lung nodules, history of stroke, history of pneumonia in September 2023, smoking, and obesity, who was previously diagnosed with obstructive sleep apnea and placed on PAP therapy.  She no longer has a CPAP machine, she reports that it has been years.  She is unclear as to how long it has been since she had a machine. Her Epworth sleepiness score is 13 out of 24, fatigue severity score is 45 out of 63.  She lives alone, she is separated, she does not wish to tell me if she has any children, she smokes about half a pack per day, alcohol  occasionally, no daily caffeine per patient, some tea, unclear if daily.  Prior sleep study results were reviewed today. I was able to review her sleep study report from 08/24/2016, study was conducted at [facility] sleep lab and study was interpreted by Dr. Havery Rumble.  She had a baseline sleep study.  Sleep efficiency was 97.4%, sleep latency 2 minutes, REM latency delayed at 237 minutes.  Total AHI was 16/h, REM AHI was 54/h.  Average oxygen saturation during wakefulness was 92.5, average oxygen saturation during sleep was 90.3, minimum oxygen saturation was 67% during REM sleep time below  or at 88% saturation was 129.6 minutes for the night.  No CPAP compliance data was available, it does not sound like she actually has a CPAP machine any longer. I reviewed your office note from 04/24/2022.  Bedtime is generally late, around 4 AM and rise time between 9 and 9:30 AM.  She has a TV on all night, she reports nocturia about 2-3 times per average night and has occasional morning headaches.  She is  waiting on getting her dentures.  Of note, she takes several medications including psychotropic medications, she also takes high-dose trazodone  at night for sleep, 200 mg nightly.   Her Past Medical History Is Significant For: Past Medical History:  Diagnosis Date   Anxiety    Aortic atherosclerosis (HCC) 09/19/2020   Seen on CAT scan 2021   Arthritis    Depression    Diabetes mellitus    Diabetes mellitus, type II (HCC)    Dyspnea    Dysrhythmia    GERD (gastroesophageal reflux disease)    Heart murmur    HTN (hypertension)    Hyperglycemia    IBS (irritable bowel syndrome)    Lung cancer (HCC)    Lung nodules    right, followed by PCP, PET 11/2011   Mental retardation    MI (myocardial infarction) (HCC)    Migraines    NAUSEA AND VOMITING 06/09/2010   Pneumonia    Sleep apnea    cpap    Stroke [facility])     Her Past Surgical History Is Significant For: Past Surgical History:  Procedure Laterality Date   ABDOMINAL HYSTERECTOMY     BIOPSY  01/15/2020   Procedure: BIOPSY;  Surgeon: Shaaron Lamar HERO, MD;  [facility];  Service: Endoscopy;;  gastric   BIOPSY  01/25/2022   Procedure: BIOPSY;  Surgeon: Shaaron Lamar HERO, MD;  [facility];  Service: Endoscopy;;  ascending;descending;sigmoid;   BRONCHIAL BIOPSY  06/14/2021   Procedure: BRONCHIAL BIOPSIES;  Surgeon: Brenna Adine CROME, DO;  [facility]: [facility];  Service: Pulmonary;;   BRONCHIAL BRUSHINGS  06/14/2021   Procedure: BRONCHIAL BRUSHINGS;  Surgeon: Brenna Adine CROME, DO;  [facility]: [facility];  Service: Pulmonary;;   BRONCHIAL NEEDLE ASPIRATION BIOPSY  06/14/2021   Procedure: BRONCHIAL NEEDLE ASPIRATION BIOPSIES;  Surgeon: Brenna Adine CROME, DO;  Location: [facility];  Service: Pulmonary;;   COLONOSCOPY  11/2007   hyperplastic polyps, prior hx of adenomas    COLONOSCOPY  05/2010   incomplete due to poor prep, hyperplastic rectal polyp   COLONOSCOPY  05/05/2002   Dimunitive polyps in the rectum  and left colon, cold    biopsied/removed.  Scattered few left-sided diverticula.  Regular colonic   mucosa appeared normal   COLONOSCOPY N/A 05/28/2013   Rourk: mulitple tubular adenomas removed. next tcs 05/2016   COLONOSCOPY WITH PROPOFOL  N/A 09/07/2016   Dr. Shaaron: For hyperplastic polyps removed. Next colonoscopy March 2023 given history of adenomatous colon polyps in the past.   COLONOSCOPY WITH PROPOFOL  N/A 01/25/2022   Redundant/elongated colon, melanosis coli, left-sided diverticula, multiple colon polyps, colonic lipoma. Negative active colitis, adenomas. 3 year surveillance.   ESOPHAGOGASTRODUODENOSCOPY  08/2007   moderate sized hiatal hernia   ESOPHAGOGASTRODUODENOSCOPY  05/2010   noncritical schatzki ring s/p 24F   ESOPHAGOGASTRODUODENOSCOPY (EGD) WITH ESOPHAGEAL DILATION N/A 02/06/2013   MFM:Wnmfjo esophagus-s/p dilation up to a 58 [place] size with [facility] dilators.  Hiatal hernia   ESOPHAGOGASTRODUODENOSCOPY (EGD) WITH PROPOFOL  N/A 09/07/2016   Dr. Shaaron: Normal, status post  empiric dilation of the esophagus for history of dysphagia   ESOPHAGOGASTRODUODENOSCOPY (EGD) WITH PROPOFOL  N/A 05/02/2019   Normal esophagus s/p dilation, normal stomach, normal duodenum   ESOPHAGOGASTRODUODENOSCOPY (EGD) WITH PROPOFOL  N/A 01/15/2020   normal esophagus s/p dilation, gastric nodule s/p biopsy. This showed reactive gastropathy with H.pylori.    EXTERNAL EAR SURGERY     bilateral   FIDUCIAL MARKER PLACEMENT  06/14/2021   Procedure: FIDUCIAL MARKER PLACEMENT;  Surgeon: Brenna Adine CROME, DO;  [facility]: [facility];  Service: Pulmonary;;   FOOT SURGERY     GLAUCOMA SURGERY     KNEE ARTHROPLASTY Right 11/04/2020   Procedure: COMPUTER ASSISTED TOTAL KNEE ARTHROPLASTY;  Surgeon: Fidel Rogue, MD;  Location: [facility];  Service: Orthopedics;  Laterality: Right;   MALONEY DILATION N/A 09/07/2016   Procedure: AGAPITO DILATION;  Surgeon: Lamar CHRISTELLA Hollingshead, MD;  [facility];  Service:  Endoscopy;  Laterality: N/A;   MALONEY DILATION N/A 05/02/2019   Procedure: AGAPITO DILATION;  Surgeon: Hollingshead Lamar CHRISTELLA, MD;  [facility];  Service: Endoscopy;  Laterality: N/A;   MALONEY DILATION N/A 01/15/2020   Procedure: AGAPITO DILATION;  Surgeon: Hollingshead Lamar CHRISTELLA, MD;  [facility];  Service: Endoscopy;  Laterality: N/A;   POLYPECTOMY  09/07/2016   Procedure: POLYPECTOMY;  Surgeon: Lamar CHRISTELLA Hollingshead, MD;  [facility];  Service: Endoscopy;;  sigmoid colon x4   POLYPECTOMY  01/25/2022   Procedure: POLYPECTOMY;  Surgeon: Hollingshead Lamar CHRISTELLA, MD;  [facility];  Service: Endoscopy;;  hepatic flexure and descending/sigmoid colon   small bowel capsule  10/2007   normal   VIDEO BRONCHOSCOPY WITH RADIAL ENDOBRONCHIAL ULTRASOUND  06/14/2021   Procedure: VIDEO BRONCHOSCOPY WITH RADIAL ENDOBRONCHIAL ULTRASOUND;  Surgeon: Brenna Adine CROME, DO;  [facility];  Service: Pulmonary;;    Her Family History Is Significant For: Family History  Problem Relation Age of Onset   Heart attack Father    Sleep apnea Father    Stroke Mother    Depression Sister    Schizophrenia Sister    Schizophrenia Other    Drug abuse Other    Alcohol  abuse Other    Colon cancer Other        aunt   Obesity Other    COPD Other    Anxiety disorder Other    GER disease Other    Diabetes type II Other    Anxiety disorder Other    Depression Other    Liver disease Neg Hx    Inflammatory bowel disease Neg Hx     Her Social History Is Significant For: Social History   Socioeconomic History   Marital status: Divorced    Spouse name: Not on file   Number of children: Not on file   Years of education: Not on file   Highest education level: High school graduate  Occupational History   Occupation: disabled    Employer: UNEMPLOYED  Tobacco Use   Smoking status: Every Day    Current packs/day: 0.25    Average packs/day: 0.3 packs/day for 40.0 years (10.0 ttl  pk-yrs)    Types: Cigarettes    Passive exposure: Current   Smokeless tobacco: Never  Vaping Use   Vaping status: Never Used  Substance and Sexual Activity   Alcohol  use: Not Currently    Comment: occ   Drug use: No   Sexual activity: Yes    Partners: Male    Birth control/protection: Surgical    Comment:  hyst  Other Topics Concern   Not on file  Social History Narrative   Lives alone   Left handed   Caffeine: 2 cups coffee/day   Social Drivers of Health   Financial Resource Strain: Low Risk  (11/06/2023)   Overall Financial Resource Strain (CARDIA)    Difficulty of Paying Living Expenses: Not very hard  Food Insecurity: Food Insecurity Present (11/06/2023)   Hunger Vital Sign    Worried About Running Out of Food in the Last Year: Sometimes true    Ran Out of Food in the Last Year: Sometimes true  Transportation Needs: No Transportation Needs (11/06/2023)   PRAPARE - Administrator, Civil Service (Medical): No    Lack of Transportation (Non-Medical): No  Physical Activity: Inactive (11/06/2023)   Exercise Vital Sign    Days of Exercise per Week: 0 days    Minutes of Exercise per Session: 0 min  Stress: No Stress Concern Present (11/06/2023)   [employer] of Occupational Health - Occupational Stress Questionnaire    Feeling of Stress : Not at all  Social Connections: Moderately Isolated (11/06/2023)   Social Connection and Isolation Panel    Frequency of Communication with Friends and Family: More than three times a week    Frequency of Social Gatherings with Friends and Family: Never    Attends Religious Services: Never    Database administrator or Organizations: No    Attends Engineer, structural: Never    Marital Status: Married    Her Allergies Are:  Allergies  Allergen Reactions   Thorazine [Chlorpromazine Hcl] Anaphylaxis   Yellow Jacket Venom Anaphylaxis and Swelling   Acetaminophen  Other (See Comments)    Makes pt dizzy   Aspirin  Other (See Comments)    seizure   Aspirin-Acetaminophen -Caffeine Other (See Comments)    seizure   Naproxen  Nausea And Vomiting   Nsaids Nausea And Vomiting   Other     Acidic foods   Penicillins Nausea And Vomiting    Has patient had a PCN reaction causing immediate rash, facial/tongue/throat swelling, SOB or lightheadedness with hypotension:Yes Has patient had a PCN reaction causing severe rash involving mucus membranes or skin necrosis:Yes Has patient had a PCN reaction that required hospitalization:Yes Has patient had a PCN reaction occurring within the last 10 years:No If all of the above answers are NO, then may proceed with Cephalosporin use.    Tomato Rash  :   Her Current Medications Are:  Outpatient Encounter Medications as of 02/26/2024  Medication Sig   albuterol  (PROVENTIL ) (2.5 MG/3ML) 0.083% nebulizer solution Take 3 mLs (2.5 mg total) by nebulization every 6 (six) hours as needed for wheezing or shortness of breath.   atorvastatin  (LIPITOR) 10 MG tablet TAKE (1) TABLET BY MOUTH ONCE DAILY.   benztropine  (COGENTIN ) 1 MG tablet Take 1 tablet (1 mg total) by mouth daily.   cycloSPORINE  (RESTASIS ) 0.05 % ophthalmic emulsion Place 1 drop into both eyes 2 (two) times daily.   Deutetrabenazine  (AUSTEDO ) 6 MG TABS Take 6 mg by mouth every morning.   diclofenac  (VOLTAREN ) 75 MG EC tablet Oral   dicyclomine  (BENTYL ) 10 MG capsule TAKE 1 CAPSULE BY MOUTH UP TO 3 TIMES DAILY BEFORE MEALS AS NEEDED FOR ABDOMINAL CRAMPS/LOOSE STOOLS.   EPINEPHrine  (EPIPEN  2-PAK) 0.3 mg/0.3 mL IJ SOAJ injection Inject 0.3 mg into the muscle as needed for anaphylaxis.   famotidine  (PEPCID ) 20 MG tablet TAKE (1) TABLET BY MOUTH TWICE DAILY.   GOODSENSE  HEMORRHOIDAL 0.25-88.44 % suppository UNWRAP AND PLACE 1 SUPPOSITORY RECTALLY 2 TIMES DAILY AS NEEDED FOR HEMORRHOIDS/ANAL ITCH   hydrocortisone  (ANUSOL -HC) 2.5 % rectal cream PLACE 1 APPLICATION RECTALLY 2 TIMES DAILY.   lamoTRIgine  (LAMICTAL ) 100 MG  tablet Take 1 tablet (100 mg total) by mouth 2 (two) times daily.   lisinopril  (ZESTRIL ) 5 MG tablet Oral   naproxen  (NAPROSYN ) 500 MG tablet TAKE 1 TABLET BY MOUTH TWICE DAILY WITH A MEAL.   NONFORMULARY OR COMPOUNDED ITEM Hemorrhoid cream   paliperidone  (INVEGA ) 9 MG 24 hr tablet Take 1 tablet (9 mg total) by mouth daily.   potassium chloride  (KLOR-CON ) 10 MEQ tablet TAKE 2 TABLETS ( ) BY MOUTH TWICE DAILY WITH FOOD   propranolol  (INDERAL ) 10 MG tablet TAKE (1) TABLET BY MOUTH THREE TIMES DAILY.   RABEprazole  (ACIPHEX ) 20 MG tablet TAKE (1) TABLET BY MOUTH TWICE DAILY.   SENNA-TABS 8.6 MG tablet TAKE (1) TABLET BY MOUTH AT BEDTIME.   sertraline  (ZOLOFT ) 100 MG tablet Take 1 tablet (100 mg total) by mouth daily.   torsemide  (DEMADEX ) 20 MG tablet TAKE (1) TABLET BY MOUTH EACH MORNING.   traZODone  (DESYREL ) 100 MG tablet Take 2 tablets (200 mg total) by mouth at bedtime as needed.   TRELEGY ELLIPTA  100-62.5-25 MCG/ACT AEPB INHALE (1) PUFF INTO THE LUNGS ONCE DAILY.   INGREZZA  80 MG CPSP Take 1 capsule by mouth daily.   No facility-administered encounter medications on file as of 02/26/2024.  :   Review of Systems:  Out of a complete 14 point review of systems, all are reviewed and negative with the exception of these symptoms as listed below:  Review of Systems  Neurological:        Patient is here with her social worker for referral for involuntary movements. She states she has shakes. Her arms and legs will shake and jerk while she's trying to eat or it can occur at other times too. She has dropped her food and cannot get the food to her mouth. She states sometimes her head, her whole body will jerk. She states she did not start cpap because she did not get a call about it.    Objective:  Neurological Exam  Physical Exam Physical Examination:   Vitals:   02/26/24 1126  BP: 120/68  Pulse: 64  SpO2: 95%    General Examination: The patient is a very pleasant 63 y.o. female  in no acute distress.  She demonstrates shaking movements and jerking movements but does not currently have any on examination.  HEENT: Normocephalic, atraumatic, pupils are equal, round and reactive to light, tracking is preserved, no obvious nystagmus.  Face is symmetric with mild facial masking, speech is edentulous.  She has no lip, neck or jaw tremor, no carotid bruits.  Airway examination reveals moderate to severe mouth dryness, tongue protrudes centrally and palate elevates symmetrically, she is edentulous.  Moderate airway crowding no involuntary tongue movements noted.     Chest: Clear to auscultation without wheezing, rhonchi or crackles noted.   Heart: S1+S2+0, regular and normal without murmurs, rubs or gallops noted.    Abdomen: Soft, non-tender and non-distended.   Extremities: There is 2+ pitting edema in the distal lower extremities bilaterally.    Skin: Warm and dry without trophic changes noted.    Musculoskeletal: exam reveals no obvious joint deformities.    Neurologically:  Mental status: The patient is awake, alert and oriented, unable to provide very many details about her history.  Some details  as supplemented by her Child psychotherapist. Cranial nerves II - XII are as described above under HEENT exam.  Motor exam: Normal bulk, moving all 4 extremities, no obvious resting tremor, no obvious myoclonus, postural or intention tremor, no action tremor.   Fine motor skills and coordination: grossly intact.  Cerebellar testing: No dysmetria or intention tremor. There is no truncal or gait ataxia.  Normal finger-to-nose. Sensory exam: intact to light touch in the upper and lower extremities.  Gait, station and balance: Walks with a rolling walker.     Assessment and Plan:  In summary, TASHAI CATINO is a 63 year old female with an underlying complex medical history of mood disorder, hypertension, heart murmur, reflux disease, history of MI, migraine headaches, sleep apnea, history  of stroke, irritable bowel syndrome, diabetes, smoking, chronic lung disease, and obesity, who presents for evaluation of her abnormal involuntary movements of several months duration.  Movements are not apparent on exam today.  She likely has movements secondary to medication side effects, sertraline  can cause tremors and Invega  can cause involuntary movements, even parkinsonism.  She is currently on Austedo  and also Cogentin .  She previously tried Ingrezza  without much success but believes that the Austedo  has helped a little.  She is advised to follow-up with her psychiatrist regarding her involuntary movements, unfortunately, there is not much more I can offer.  With her upcoming checkup and any scheduled blood work I recommend checking her thyroid  function.  She was diagnosed with obstructive sleep apnea several years ago and we also did a sleep study in March 2024 which confirmed moderate obstructive sleep apnea and nocturnal hypoxemia.  She is strongly advised to work on smoking cessation.  She is advised to proceed with treatment with home AutoPap therapy.  She is willing to proceed.  If her insurance mandates another sleep study, we can consider doing a home sleep test.  We will plan a follow-up in about 3 months for her sleep apnea checkup.  I answered all their questions today and the patient and her social worker were in agreement.   I spent 45 minutes in total face-to-face time and in reviewing records during pre-charting, more than 50% of which was spent in counseling and coordination of care, reviewing test results, reviewing medications and treatment regimen and/or in discussing or reviewing the diagnosis of OSA, abnormal involuntary movements, the prognosis and treatment options. Pertinent laboratory and imaging test results that were available during this visit with the patient were reviewed by me and considered in my medical decision making (see chart for details).

## 2024-02-27 DIAGNOSIS — A749 Chlamydial infection, unspecified: Secondary | ICD-10-CM | POA: Insufficient documentation

## 2024-02-27 MED ORDER — METRONIDAZOLE 500 MG PO TABS: 500.0000 mg | ORAL_TABLET | Freq: Two times a day (BID) | ORAL | 0 refills | Status: DC

## 2024-02-27 MED ORDER — DOXYCYCLINE HYCLATE 100 MG PO TABS: 100.0000 mg | ORAL_TABLET | Freq: Two times a day (BID) | ORAL | 0 refills | Status: DC

## 2024-02-27 NOTE — Telephone Encounter (Signed)
-----   Message from [facility] sent at 02/25/2024 10:51 AM EDT ----- Please let her know negative HIV and RPR. THX

## 2024-02-27 NOTE — Addendum Note (Signed)
 Addended by: Dushaun Okey A on: 02/27/2024 01:09 PM   Modules accepted: Orders

## 2024-02-27 NOTE — Telephone Encounter (Signed)
-----   Message from Delon Lewis sent at 02/27/2024  1:11 PM EDT ----- Can you call her guardian let her know about this

## 2024-02-27 NOTE — Telephone Encounter (Signed)
 Left message with Rosaline @ [facility] @ 2:25 pm to return call regarding results. JSY

## 2024-02-27 NOTE — Telephone Encounter (Signed)
 Spoke with [name] from Social services, letting her know [name]'s HIV and RPR were both negative. [name] voiced understanding. JSY

## 2024-02-28 ENCOUNTER — Telehealth: Payer: Self-pay | Admitting: *Deleted

## 2024-02-28 NOTE — Telephone Encounter (Signed)
 Last sleep study results 10-2023, order for new machine.  DME: [facility]?  LMVM for Rosaline, legal guardian to call back.

## 2024-02-28 NOTE — Telephone Encounter (Signed)
 Spoke with [name] @ [facility] letting her know the results of [name]'s swab. Med sent to pharmacy for [facility] and BV. No sex or alcohol  while taking meds. Partner needs to be treated. He can see his dr or go to health dept. [name] voiced understanding. Call transferred to [facility] for poc appt in 4 weeks. JSY

## 2024-02-28 NOTE — Telephone Encounter (Signed)
-----   Message from Delon Lewis sent at 02/27/2024  1:11 PM EDT ----- Can you call her guardian let her know about this

## 2024-03-05 ENCOUNTER — Other Ambulatory Visit: Payer: Self-pay | Admitting: *Deleted

## 2024-03-05 DIAGNOSIS — E119 Type 2 diabetes mellitus without complications: Secondary | ICD-10-CM

## 2024-03-12 ENCOUNTER — Ambulatory Visit: Payer: Self-pay | Admitting: Family Medicine

## 2024-03-12 LAB — BASIC METABOLIC PANEL WITH GFR
BUN/Creatinine Ratio: 10 — ABNORMAL LOW (ref 12–28)
BUN: 9 mg/dL (ref 8–27)
CO2: 23 mmol/L (ref 20–29)
Calcium: 9.3 mg/dL (ref 8.7–10.3)
Chloride: 102 mmol/L (ref 96–106)
Creatinine, Ser: 0.9 mg/dL (ref 0.57–1.00)
Glucose: 100 mg/dL — ABNORMAL HIGH (ref 70–99)
Potassium: 4.1 mmol/L (ref 3.5–5.2)
Sodium: 140 mmol/L (ref 134–144)
eGFR: 73 mL/min/1.73 (ref >59)

## 2024-03-12 LAB — HEMOGLOBIN A1C
Est. average glucose Bld gHb Est-mCnc: 126 mg/dL
Hgb A1c MFr Bld: 6 % — ABNORMAL HIGH (ref 4.8–5.6)

## 2024-03-14 ENCOUNTER — Ambulatory Visit: Admitting: Family Medicine

## 2024-03-14 ENCOUNTER — Telehealth: Payer: Self-pay | Admitting: Family Medicine

## 2024-03-14 ENCOUNTER — Encounter: Payer: Self-pay | Admitting: Family Medicine

## 2024-03-14 VITALS — BP 119/70 | HR 70 | Temp 97.2°F | Ht 70.5 in | Wt 236.0 lb

## 2024-03-14 DIAGNOSIS — R296 Repeated falls: Secondary | ICD-10-CM | POA: Diagnosis not present

## 2024-03-14 DIAGNOSIS — Z23 Encounter for immunization: Secondary | ICD-10-CM

## 2024-03-14 DIAGNOSIS — G4730 Sleep apnea, unspecified: Secondary | ICD-10-CM

## 2024-03-14 DIAGNOSIS — J438 Other emphysema: Secondary | ICD-10-CM

## 2024-03-14 DIAGNOSIS — K649 Unspecified hemorrhoids: Secondary | ICD-10-CM | POA: Diagnosis not present

## 2024-03-14 DIAGNOSIS — Z720 Tobacco use: Secondary | ICD-10-CM

## 2024-03-14 NOTE — Progress Notes (Signed)
   Subjective:    Patient ID: [name], female    DOB: [birth year], 63 y.o.   MRN: [id number]  HPI 4 month follow up  C/o lungs tightening and feels winded with walking Smoking about 1 pack cigs Q 3 days Swelling in legs and feet  Midback pain going on not taking anything for pain  Patient was have mid back pain Occasionally takes Tylenol  Does a little bit of walking Has a home aide several days a week Patient suffers with mental health as well as cognitive issues which make it difficult for her to care for herself and optimal standpoint Refills for hemerrhoidal creams     Review of Systems     Objective:   Physical Exam  General-in no acute distress Eyes-no discharge Lungs-respiratory rate normal, CTA CV-no murmurs,RRR Extremities skin warm dry no edema Neuro grossly normal Behavior normal, alert       Assessment & Plan:  .1. Hemorrhoids, unspecified hemorrhoid type Refills will be given regarding her creams  2. Immunization due (Primary) Flu shot today - Flu vaccine trivalent PF, 6mos and older(Flulaval,Afluria,Fluarix,Fluzone)  3. Other emphysema (HCC) Continue inhalers quit smoking  4. Tobacco use Quit smoking continue with pulmonary  Continue lung CT surveillance  5. Frequent falls Patient encouraged to use walker  6. Sleep apnea, unspecified type Apparently the sleep apnea clinic tried to reach out to the patient apparently she did not answer her phone the difficult part is is that she has underlying mental health and cognitive issues we will try to coordinate her getting her sleep apnea machine it was impressed upon her that she has to use at least 4 hours every night for insurance to pay for Labs reviewed in detail Results for orders placed or performed in visit on 03/05/24  Basic metabolic panel with GFR   Collection Time: 03/11/24  9:46 AM  Result Value Ref Range   Glucose 100 (H) 70 - 99 mg/dL   BUN 9 8 - 27 mg/dL   Creatinine, Ser 9.09  0.57 - 1.00 mg/dL   eGFR 73 >40 fO/fpw/8.26   BUN/Creatinine Ratio 10 (L) 12 - 28   Sodium 140 134 - 144 mmol/L   Potassium 4.1 3.5 - 5.2 mmol/L   Chloride 102 96 - 106 mmol/L   CO2 23 20 - 29 mmol/L   Calcium  9.3 8.7 - 10.3 mg/dL  Hemoglobin J8r   Collection Time: 03/11/24  9:46 AM  Result Value Ref Range   Hgb A1c MFr Bld 6.0 (H) 4.8 - 5.6 %   Est. average glucose Bld gHb Est-mCnc 126 mg/dL   *Note: Due to a large number of results and/or encounters for the requested time period, some results have not been displayed. A complete set of results can be found in Results Review.    Follow-up in several months as planned

## 2024-03-14 NOTE — Telephone Encounter (Signed)
 Patient apparently has some sort of hemorrhoid cream that her pharmacy that needs refilling but when I tried to refill it via electronic record they would not refill would you please call the pharmacy to see if they can just go ahead and take a verbal order to add 3 refills to hemorrhoid creams thanks

## 2024-03-17 ENCOUNTER — Other Ambulatory Visit: Payer: Self-pay

## 2024-03-17 DIAGNOSIS — R296 Repeated falls: Secondary | ICD-10-CM

## 2024-03-17 DIAGNOSIS — G4730 Sleep apnea, unspecified: Secondary | ICD-10-CM

## 2024-03-17 DIAGNOSIS — F251 Schizoaffective disorder, depressive type: Secondary | ICD-10-CM

## 2024-03-17 DIAGNOSIS — R251 Tremor, unspecified: Secondary | ICD-10-CM

## 2024-03-17 DIAGNOSIS — E119 Type 2 diabetes mellitus without complications: Secondary | ICD-10-CM

## 2024-03-17 MED ORDER — HYDROCORTISONE (PERIANAL) 2.5 % EX CREA: TOPICAL_CREAM | Freq: Two times a day (BID) | CUTANEOUS | 6 refills | Status: AC

## 2024-03-17 NOTE — Progress Notes (Signed)
 Referral to [facility] sent for DME CPAP

## 2024-03-17 NOTE — Telephone Encounter (Signed)
 Please clarify directions for compounded hemorrhoid cream or do you just want patient to have hydrocortisone ?

## 2024-03-17 NOTE — Telephone Encounter (Signed)
 I would recommend the hydrocortisone  2.5% cream apply thin amount twice daily as needed 60 g 2 refills

## 2024-03-17 NOTE — Addendum Note (Signed)
 Addended by: Domique Clapper M on: 03/17/2024 09:12 AM   Modules accepted: Orders

## 2024-03-17 NOTE — Progress Notes (Signed)
 Orders placed.

## 2024-03-19 ENCOUNTER — Other Ambulatory Visit (HOSPITAL_COMMUNITY): Payer: Self-pay | Admitting: Psychiatry

## 2024-03-20 ENCOUNTER — Other Ambulatory Visit: Payer: Self-pay | Admitting: Family Medicine

## 2024-03-27 ENCOUNTER — Ambulatory Visit: Admitting: *Deleted

## 2024-03-27 ENCOUNTER — Ambulatory Visit: Admitting: Nurse Practitioner

## 2024-03-27 DIAGNOSIS — Z8619 Personal history of other infectious and parasitic diseases: Secondary | ICD-10-CM

## 2024-03-27 DIAGNOSIS — Z09 Encounter for follow-up examination after completed treatment for conditions other than malignant neoplasm: Secondary | ICD-10-CM

## 2024-03-27 NOTE — Progress Notes (Signed)
   NURSE VISIT- VAGINITIS/STD/POC  SUBJECTIVE:  [name] is a 63 y.o. [phone] GYN patientfemale here for a vaginal swab for proof of cure after treatment for Chlamydia.  She reports the following symptoms: none for 0 days. Denies abnormal vaginal bleeding, significant pelvic pain, fever, or UTI symptoms.  OBJECTIVE:  There were no vitals taken for this visit.  Appears well, in no apparent distress  ASSESSMENT: Vaginal swab for proof of cure after treatment for chlamydia  PLAN: Self-collected urine sample for Gonorrhea, Chlamydia sent to lab Treatment: to be determined once results are received Follow-up as needed if symptoms persist/worsen, or new symptoms develop  Alan LITTIE Fischer  03/27/2024 11:33 AM

## 2024-03-31 ENCOUNTER — Telehealth: Payer: Self-pay

## 2024-03-31 LAB — GC/CHLAMYDIA PROBE AMP
Chlamydia trachomatis, NAA: NEGATIVE
Neisseria Gonorrhoeae by PCR: NEGATIVE

## 2024-03-31 NOTE — Telephone Encounter (Signed)
 Left message with verbal authorizing therapy

## 2024-03-31 NOTE — Telephone Encounter (Signed)
 Copied from CRM [id number]. Topic: Clinical - Home Health Verbal Orders >> Mar 28, 2024  4:59 PM Everette C wrote: Caller/Agency: [facility] Number: [phone] Service Requested: Skilled Nursing   Start of care has been moved to 04/01/24

## 2024-04-03 ENCOUNTER — Ambulatory Visit: Admitting: Podiatry

## 2024-04-10 ENCOUNTER — Telehealth: Payer: Self-pay

## 2024-04-10 NOTE — Telephone Encounter (Signed)
 Copied from CRM #[id number]. Topic: Clinical - Medical Advice >> Apr 09, 2024  3:09 PM Sophia H wrote: Reason for CRM: Spoke with Mitzie - [facility] who is calling in on behalf of the patient. Wanting to know if PCP would like for patient to check her blood sugars daily, she does not have supplies and will need an RX if so. Please advise   Can contact Chelsea - [phone]  [facility] - [place], [place] - [address]

## 2024-04-11 NOTE — Telephone Encounter (Signed)
 Patient has prediabetes Does not have diabetes Is not on insulin  Therefore she does not need to check her sugars We periodically draw blood and check A1c's through the office If any problems let us  know

## 2024-04-11 NOTE — Telephone Encounter (Signed)
 Spoke with [name] [facility] to let her know per drs notes

## 2024-04-16 ENCOUNTER — Other Ambulatory Visit: Payer: Self-pay | Admitting: Family Medicine

## 2024-04-16 ENCOUNTER — Ambulatory Visit: Admitting: Neurology

## 2024-04-16 ENCOUNTER — Other Ambulatory Visit: Payer: Self-pay | Admitting: Nurse Practitioner

## 2024-04-16 DIAGNOSIS — M25569 Pain in unspecified knee: Secondary | ICD-10-CM

## 2024-04-16 DIAGNOSIS — E785 Hyperlipidemia, unspecified: Secondary | ICD-10-CM

## 2024-05-13 ENCOUNTER — Encounter (HOSPITAL_COMMUNITY): Payer: Self-pay | Admitting: Emergency Medicine

## 2024-05-13 ENCOUNTER — Emergency Department (HOSPITAL_COMMUNITY)

## 2024-05-13 ENCOUNTER — Emergency Department (HOSPITAL_COMMUNITY)
Admission: EM | Admit: 2024-05-13 | Discharge: 2024-05-13 | Disposition: A | Discharge status: Home / Self Care | Attending: Emergency Medicine | Admitting: Emergency Medicine

## 2024-05-13 ENCOUNTER — Other Ambulatory Visit: Payer: Self-pay

## 2024-05-13 DIAGNOSIS — S0083XA Contusion of other part of head, initial encounter: Secondary | ICD-10-CM

## 2024-05-13 DIAGNOSIS — S0031XA Abrasion of nose, initial encounter: Secondary | ICD-10-CM | POA: Diagnosis not present

## 2024-05-13 DIAGNOSIS — S065XAA Traumatic subdural hemorrhage with loss of consciousness status unknown, initial encounter: Secondary | ICD-10-CM

## 2024-05-13 DIAGNOSIS — Y92410 Unspecified street and highway as the place of occurrence of the external cause: Secondary | ICD-10-CM | POA: Insufficient documentation

## 2024-05-13 DIAGNOSIS — S065X0A Traumatic subdural hemorrhage without loss of consciousness, initial encounter: Secondary | ICD-10-CM | POA: Diagnosis not present

## 2024-05-13 DIAGNOSIS — S0990XA Unspecified injury of head, initial encounter: Secondary | ICD-10-CM | POA: Diagnosis present

## 2024-05-13 NOTE — ED Provider Notes (Signed)
 [place] EMERGENCY DEPARTMENT AT [facility] Provider Note   CSN: [id number] Arrival date & time: 05/13/24  0025     Patient presents with: [name] Georgina is a 63 y.o. female.   Patient presents to the emergency department for evaluation after a fall.  Patient reports that she was trying to get into her car, lost her balance and fell.  She did hit her head on the ground and think she was knocked out.  She is complaining of facial pain and headache.       Prior to Admission medications   Medication Sig Start Date End Date Taking? Authorizing Provider  albuterol  (PROVENTIL ) (2.5 MG/3ML) 0.083% nebulizer solution Take 3 mLs (2.5 mg total) by nebulization every 6 (six) hours as needed for wheezing or shortness of breath. 06/20/22   Icard, Adine CROME, DO  atorvastatin  (LIPITOR) 10 MG tablet TAKE (1) TABLET BY MOUTH ONCE DAILY. 04/17/24   Alphonsa Glendia LABOR, MD  benztropine  (COGENTIN ) 1 MG tablet Take 1 tablet (1 mg total) by mouth daily. 01/09/24   Okey Barnie SAUNDERS, MD  cycloSPORINE  (RESTASIS ) 0.05 % ophthalmic emulsion Place 1 drop into both eyes 2 (two) times daily.    [provider]  Deutetrabenazine  (AUSTEDO ) 6 MG TABS TAKE (1) TABLET BY MOUTH ONCE DAILY. 03/19/24   Okey Barnie SAUNDERS, MD  diclofenac  (VOLTAREN ) 75 MG EC tablet Oral 06/05/16   [provider]  dicyclomine  (BENTYL ) 10 MG capsule TAKE 1 CAPSULE BY MOUTH UP TO 3 TIMES DAILY BEFORE MEALS AS NEEDED FOR ABDOMINAL CRAMPS/LOOSE STOOLS. 07/26/23   Ezzard Sonny RAMAN, PA-C  EPINEPHrine  (EPIPEN  2-PAK) 0.3 mg/0.3 mL IJ SOAJ injection Inject 0.3 mg into the muscle as needed for anaphylaxis. 11/12/23   Grooms, Courtney, PA-C  famotidine  (PEPCID ) 20 MG tablet TAKE (1) TABLET BY MOUTH TWICE DAILY. 07/25/23   Cook, Jayce G, DO  GOODSENSE HEMORRHOIDAL 0.25-88.44 % suppository UNWRAP AND PLACE 1 SUPPOSITORY RECTALLY 2 TIMES DAILY AS NEEDED FOR HEMORRHOIDS/ANAL [facility] 01/21/24   Luking, Glendia LABOR, MD  hydrocortisone  (ANUSOL -HC)  2.5 % rectal cream Place rectally 2 (two) times daily. PLACE 1 APPLICATION RECTALLY 2 TIMES DAILY. 03/17/24   Alphonsa Glendia LABOR, MD  INGREZZA  80 MG CPSP Take 1 capsule by mouth daily. 01/01/24   [provider]  lamoTRIgine  (LAMICTAL ) 100 MG tablet Take 1 tablet (100 mg total) by mouth 2 (two) times daily. 01/09/24   Okey Barnie SAUNDERS, MD  lisinopril  (ZESTRIL ) 5 MG tablet Oral 06/05/16   [provider]  naproxen  (NAPROSYN ) 500 MG tablet TAKE 1 TABLET BY MOUTH TWICE DAILY WITH A MEAL. 04/17/24   Alphonsa Glendia LABOR, MD  NONFORMULARY OR COMPOUNDED ITEM Hemorrhoid cream    [provider]  paliperidone  (INVEGA ) 9 MG 24 hr tablet Take 1 tablet (9 mg total) by mouth daily. 01/09/24   Okey Barnie SAUNDERS, MD  potassium chloride  (KLOR-CON ) 10 MEQ tablet TAKE 2 TABLETS ( ) BY MOUTH TWICE DAILY WITH FOOD 03/21/24   Alphonsa Glendia LABOR, MD  propranolol  (INDERAL ) 10 MG tablet TAKE (1) TABLET BY MOUTH THREE TIMES DAILY. 07/25/23   Cook, Jayce G, DO  RABEprazole  (ACIPHEX ) 20 MG tablet TAKE (1) TABLET BY MOUTH TWICE DAILY. 07/26/23   Ezzard Sonny RAMAN, PA-C  SENNA-TABS 8.6 MG tablet TAKE (1) TABLET BY MOUTH AT BEDTIME. 01/18/24   Ezzard Sonny RAMAN, PA-C  sertraline  (ZOLOFT ) 100 MG tablet Take 1 tablet (100 mg total) by mouth daily. 01/09/24   Okey Barnie SAUNDERS,  MD  torsemide  (DEMADEX ) 20 MG tablet TAKE (1) TABLET BY MOUTH EACH MORNING. 04/17/24   Alphonsa Glendia LABOR, MD  traZODone  (DESYREL ) 100 MG tablet Take 2 tablets (200 mg total) by mouth at bedtime as needed. 01/09/24   Okey Barnie SAUNDERS, MD  TRELEGY ELLIPTA  100-62.5-25 MCG/ACT AEPB INHALE (1) PUFF INTO THE LUNGS ONCE DAILY. 10/23/23   Cobb, Comer GAILS, NP    Allergies: Thorazine [chlorpromazine hcl], Yellow jacket venom, Acetaminophen , Aspirin, Aspirin-acetaminophen -caffeine, Naproxen , Nsaids, Other, Penicillins, and Tomato    Review of Systems  Updated Vital Signs BP 120/69   Pulse 71   Temp 98.4 F (36.9 C) (Oral)   Resp 18   Ht 5' 10.5 (1.791 m)   Wt 107  kg   SpO2 93%   BMI 33.37 kg/m   Physical Exam Vitals and nursing note reviewed.  Constitutional:      General: She is not in acute distress.    Appearance: She is well-developed.  HENT:     Head: Normocephalic and atraumatic.     Nose: Congestion (Slight abrasion) present.     Mouth/Throat:     Mouth: Mucous membranes are moist.  Eyes:     General: Vision grossly intact. Gaze aligned appropriately.     Extraocular Movements: Extraocular movements intact.     Conjunctiva/sclera: Conjunctivae normal.  Cardiovascular:     Rate and Rhythm: Normal rate and regular rhythm.     Pulses: Normal pulses.     Heart sounds: Normal heart sounds, S1 normal and S2 normal. No murmur heard.    No friction rub. No gallop.  Pulmonary:     Effort: Pulmonary effort is normal. No respiratory distress.     Breath sounds: Normal breath sounds.  Abdominal:     General: Bowel sounds are normal.     Palpations: Abdomen is soft.     Tenderness: There is no abdominal tenderness. There is no guarding or rebound.     Hernia: No hernia is present.  Musculoskeletal:        General: No swelling.     Cervical back: Full passive range of motion without pain, normal range of motion and neck supple. No spinous process tenderness or muscular tenderness. Normal range of motion.     Right lower leg: No edema.     Left lower leg: No edema.  Skin:    General: Skin is warm and dry.     Capillary Refill: Capillary refill takes less than 2 seconds.     Findings: No ecchymosis, erythema, rash or wound.  Neurological:     General: No focal deficit present.     Mental Status: She is alert and oriented to person, place, and time.     GCS: GCS eye subscore is 4. GCS verbal subscore is 5. GCS motor subscore is 6.     Cranial Nerves: Cranial nerves 2-12 are intact.     Sensory: Sensation is intact.     Motor: Motor function is intact.     Coordination: Coordination is intact.  Psychiatric:        Attention and  Perception: Attention normal.        Mood and Affect: Mood normal.        Speech: Speech normal.        Behavior: Behavior normal.     (all labs ordered are listed, but only abnormal results are displayed) Labs Reviewed - No data to display  EKG: None  Radiology: DG Hip Unilat W or Wo  Pelvis 2-3 Views Left Result Date: 05/13/2024 CLINICAL DATA:  Status post fall. EXAM: DG HIP (WITH OR WITHOUT PELVIS) 2-3V LEFT COMPARISON:  None Available. FINDINGS: There is no evidence of hip fracture or dislocation. There is no evidence of arthropathy or other focal bone abnormality. IMPRESSION: Negative. Electronically Signed   By: Suzen Dials M.D.   On: 05/13/2024 03:00   DG Hip Unilat W or Wo Pelvis 2-3 Views Right Result Date: 05/13/2024 CLINICAL DATA:  Status post fall. EXAM: DG HIP (WITH OR WITHOUT PELVIS) 2-3V RIGHT COMPARISON:  None Available. FINDINGS: There is no evidence of hip fracture or dislocation. There is no evidence of arthropathy or other focal bone abnormality. IMPRESSION: Negative. Electronically Signed   By: Suzen Dials M.D.   On: 05/13/2024 02:58   CT HEAD WO CONTRAST ( ) Result Date: 05/13/2024 EXAM: CT HEAD, FACIAL BONES AND CERVICAL SPINE WITHOUT CONTRAST 05/13/2024 02:19:16 AM TECHNIQUE: CT of the head, facial bones and cervical spine was performed without the administration of intravenous contrast. Multiplanar reformatted images are provided for review. Automated exposure control, iterative reconstruction, and/or weight based adjustment of the mA/kV was utilized to reduce the radiation dose to as low as reasonably achievable. COMPARISON: CT head and CT cervical spine 01/08/2024 CLINICAL HISTORY: Head trauma, moderate-severe FINDINGS: CT HEAD BRAIN AND VENTRICLES: Small volume (2 mm thick) acute subdural hemorrhage along the anterior falx. No mass effect or midline shift. No evidence of acute infarct. No hydrocephalus. SKULL AND SCALP: No acute skull fracture. CT FACIAL  BONES FACIAL BONES: No acute facial fracture. No mandibular dislocation. No suspicious bone lesion. ORBITS: No acute traumatic injury. SINUSES AND MASTOIDS: No acute abnormality. SOFT TISSUES: No acute abnormality. CT CERVICAL SPINE BONES AND ALIGNMENT: No acute fracture or traumatic malalignment. DEGENERATIVE CHANGES: Moderate multilevel degenerative disc disease with endplate spurring and bridging osteophytes. Facet and uncovertebral hypertrophy with varying degrees of neural foraminal stenosis. SOFT TISSUES: No prevertebral soft tissue swelling. Other: Findings conveyed to Dr. Haze via telephone at 2:47 PM. IMPRESSION: 1. Small volume (2 mm thick) acute subdural hemorrhage along the anterior falx. 2. No facial fracture. 3. No acute fracture or traumatic malalignment in the cervical spine. Electronically signed by: Gilmore Molt MD 05/13/2024 02:50 AM EST RP Workstation: [id number]   CT CERVICAL SPINE WO CONTRAST Result Date: 05/13/2024 EXAM: CT HEAD, FACIAL BONES AND CERVICAL SPINE WITHOUT CONTRAST 05/13/2024 02:19:16 AM TECHNIQUE: CT of the head, facial bones and cervical spine was performed without the administration of intravenous contrast. Multiplanar reformatted images are provided for review. Automated exposure control, iterative reconstruction, and/or weight based adjustment of the mA/kV was utilized to reduce the radiation dose to as low as reasonably achievable. COMPARISON: CT head and CT cervical spine 01/08/2024 CLINICAL HISTORY: Head trauma, moderate-severe FINDINGS: CT HEAD BRAIN AND VENTRICLES: Small volume (2 mm thick) acute subdural hemorrhage along the anterior falx. No mass effect or midline shift. No evidence of acute infarct. No hydrocephalus. SKULL AND SCALP: No acute skull fracture. CT FACIAL BONES FACIAL BONES: No acute facial fracture. No mandibular dislocation. No suspicious bone lesion. ORBITS: No acute traumatic injury. SINUSES AND MASTOIDS: No acute abnormality. SOFT TISSUES:  No acute abnormality. CT CERVICAL SPINE BONES AND ALIGNMENT: No acute fracture or traumatic malalignment. DEGENERATIVE CHANGES: Moderate multilevel degenerative disc disease with endplate spurring and bridging osteophytes. Facet and uncovertebral hypertrophy with varying degrees of neural foraminal stenosis. SOFT TISSUES: No prevertebral soft tissue swelling. Other: Findings conveyed to Dr. Haze via telephone at 2:47 PM. IMPRESSION: 1.  Small volume (2 mm thick) acute subdural hemorrhage along the anterior falx. 2. No facial fracture. 3. No acute fracture or traumatic malalignment in the cervical spine. Electronically signed by: Gilmore Molt MD 05/13/2024 02:50 AM EST RP Workstation: [id number]   CT MAXILLOFACIAL WO CONTRAST Result Date: 05/13/2024 EXAM: CT HEAD, FACIAL BONES AND CERVICAL SPINE WITHOUT CONTRAST 05/13/2024 02:19:16 AM TECHNIQUE: CT of the head, facial bones and cervical spine was performed without the administration of intravenous contrast. Multiplanar reformatted images are provided for review. Automated exposure control, iterative reconstruction, and/or weight based adjustment of the mA/kV was utilized to reduce the radiation dose to as low as reasonably achievable. COMPARISON: CT head and CT cervical spine 01/08/2024 CLINICAL HISTORY: Head trauma, moderate-severe FINDINGS: CT HEAD BRAIN AND VENTRICLES: Small volume (2 mm thick) acute subdural hemorrhage along the anterior falx. No mass effect or midline shift. No evidence of acute infarct. No hydrocephalus. SKULL AND SCALP: No acute skull fracture. CT FACIAL BONES FACIAL BONES: No acute facial fracture. No mandibular dislocation. No suspicious bone lesion. ORBITS: No acute traumatic injury. SINUSES AND MASTOIDS: No acute abnormality. SOFT TISSUES: No acute abnormality. CT CERVICAL SPINE BONES AND ALIGNMENT: No acute fracture or traumatic malalignment. DEGENERATIVE CHANGES: Moderate multilevel degenerative disc disease with endplate  spurring and bridging osteophytes. Facet and uncovertebral hypertrophy with varying degrees of neural foraminal stenosis. SOFT TISSUES: No prevertebral soft tissue swelling. Other: Findings conveyed to Dr. Haze via telephone at 2:47 PM. IMPRESSION: 1. Small volume (2 mm thick) acute subdural hemorrhage along the anterior falx. 2. No facial fracture. 3. No acute fracture or traumatic malalignment in the cervical spine. Electronically signed by: Gilmore Molt MD 05/13/2024 02:50 AM EST RP Workstation: [id number]     Procedures   Medications Ordered in the ED - No data to display                                  Medical Decision Making Amount and/or Complexity of Data Reviewed Radiology: ordered and independent interpretation performed. Decision-making details documented in ED Course.   Patient presents to the emergency department for evaluation after ground-level fall.  She is complaining of headache, facial pain, some neck pain and bilateral hip pain.  X-rays of hips were negative.  She has retained range of motion at the hips.  No other extremity injury.  No midline back pain.  CT head, cervical spine, facial bones performed.  Patient with tiny, 2 mm acute subdural hemorrhage in the anterior falx.  No shift.  No other injury.  Cervical spine and facial bones unremarkable.  Patient does not take blood thinners.  Bleeding is minimal.  Will recheck CT in 4 to 6 hours, if no change, appropriate for discharge.  Addendum 6:30 AM-repeat CT has been performed.  Bleeding is stable, slightly less than before.  Patient continues to do well.  Will discharge, no particular follow-up necessary, given return precautions.     Final diagnoses:  Contusion of face, initial encounter  Subdural hematoma [facility])    ED Discharge Orders     None          Jisell Majer, Lonni PARAS, MD 05/13/24 0630

## 2024-05-13 NOTE — ED Triage Notes (Signed)
 Pt states she fell while trying to get into car about 40 minutes ago. Pt states she lost consciousness. Pt has abrasion to nose and c/o headache.

## 2024-05-13 NOTE — ED Notes (Signed)
 Discharge instructions reviewed.   Opportunity for questions and concerns provided.   Alert, oriented and escorted to sisters vehicle via wheelchair.   Displays no signs of distress.

## 2024-05-13 NOTE — Discharge Instructions (Signed)
 There was a tiny amount of bleeding on your brain from the fall.  This should not cause any long-term problems at all.  As long as you are feeling well there is really nothing to do.  If you start having increased pain or other neurologic symptoms, please come back to the ER immediately.

## 2024-05-14 ENCOUNTER — Other Ambulatory Visit: Payer: Self-pay | Admitting: Nurse Practitioner

## 2024-05-28 ENCOUNTER — Ambulatory Visit: Admitting: Neurology

## 2024-05-28 ENCOUNTER — Telehealth: Payer: Self-pay | Admitting: Neurology

## 2024-05-28 NOTE — Telephone Encounter (Signed)
 [name] from [facility](pt case worker) called DME was told they need the Rx for pt's CPAP.  It can be faxed to [phone]

## 2024-05-28 NOTE — Telephone Encounter (Signed)
 Pt does not have CPAP yet, appointment cx

## 2024-05-28 NOTE — Telephone Encounter (Signed)
 Called back and spoke w/ Rosaline. She states DME: [facility]. Aware we will resend order to [employer]. She needs to call to schedule appt with our office day she receives machine and it needs to be 31-90 days from the day she receives it.   Feels pt will be non-compliant with therapy. Lives in independent living. Has a nurse aid 4 hr per day but alone at night. Ambulates with walker. They will update us  if pt non-compliant with therapy once she starts.   Sent community message to [employer] that ordered placed 02/26/2024.

## 2024-06-04 ENCOUNTER — Ambulatory Visit (INDEPENDENT_AMBULATORY_CARE_PROVIDER_SITE_OTHER): Admitting: Podiatry

## 2024-06-04 ENCOUNTER — Encounter: Payer: Self-pay | Admitting: Podiatry

## 2024-06-04 DIAGNOSIS — M79676 Pain in unspecified toe(s): Secondary | ICD-10-CM | POA: Diagnosis not present

## 2024-06-04 DIAGNOSIS — E1142 Type 2 diabetes mellitus with diabetic polyneuropathy: Secondary | ICD-10-CM

## 2024-06-04 DIAGNOSIS — B351 Tinea unguium: Secondary | ICD-10-CM

## 2024-06-04 NOTE — Progress Notes (Signed)
 This patient returns to my office for at risk foot care.  This patient requires this care by a professional since this patient will be at risk due to having  diabetes.  This patient is unable to cut nails himself since the patient cannot reach his nails.These nails are painful walking and wearing shoes.  This patient presents for at risk foot care today.  General Appearance  Alert, conversant and in no acute stress.  Vascular  Dorsalis pedis and posterior tibial  pulses are palpable  bilaterally.  Capillary return is within normal limits  bilaterally. Temperature is within normal limits  bilaterally.  Neurologic  Senn-Weinstein monofilament wire test within normal limits  bilaterally. Muscle power within normal limits bilaterally.  Nails Thick disfigured discolored nails with subungual debris  from hallux to fifth toes bilaterally. No evidence of bacterial infection or drainage bilaterally.  Orthopedic  No limitations of motion  feet .  No crepitus or effusions noted.  No bony pathology or digital deformities noted.  Skin  normotropic skin with no porokeratosis noted bilaterally.  No signs of infections or ulcers noted.     Onychomycosis  Pain in right toes  Pain in left toes  Consent was obtained for treatment procedures.   Mechanical debridement of nails 1-5  bilaterally performed with a nail nipper.  Filed with dremel without incident.    Return office visit    4  months                  Told patient to return for periodic foot care and evaluation due to potential at risk complications.   Helane Gunther DPM

## 2024-06-10 ENCOUNTER — Other Ambulatory Visit: Payer: Self-pay | Admitting: Gastroenterology

## 2024-06-10 ENCOUNTER — Other Ambulatory Visit (HOSPITAL_COMMUNITY): Payer: Self-pay | Admitting: Psychiatry

## 2024-06-11 NOTE — Telephone Encounter (Signed)
 Please call social worker, pt needs appt

## 2024-06-19 ENCOUNTER — Telehealth: Payer: Self-pay

## 2024-06-19 NOTE — Telephone Encounter (Signed)
 Need Supplies for bed pads and lift chair sent to [facility]

## 2024-06-20 ENCOUNTER — Other Ambulatory Visit: Payer: Self-pay | Admitting: Family Medicine

## 2024-06-20 DIAGNOSIS — K649 Unspecified hemorrhoids: Secondary | ICD-10-CM

## 2024-07-01 ENCOUNTER — Telehealth: Payer: Self-pay | Admitting: *Deleted

## 2024-07-01 NOTE — Telephone Encounter (Signed)
 If patient is having severe difficulty swallowing I recommend ER otherwise speech therapy will evaluate May follow-up here for any regular follow-up visits or if new concerns

## 2024-07-01 NOTE — Telephone Encounter (Signed)
 Copied from CRM [id number]. Topic: Clinical - Home Health Verbal Orders >> Jun 25, 2024  1:50 PM Franky GRADE wrote: Caller/Agency: [facility]  Callback Number: [phone] ext [phone]. Secure voicemail.  Service Requested: Speech Therapy Frequency: Evaluation  Any new concerns about the patient? Yes, difficulty swallowing. Speech Therapist won't be available until 07/07/2024

## 2024-07-09 NOTE — Telephone Encounter (Signed)
 Spoke with pt's child psychotherapist and with home health, [facility], verbal orders given for speech evaluation for trouble swallowing

## 2024-07-12 NOTE — Telephone Encounter (Signed)
 I will need new prescription pad I am out of the prescriptions  Please write out order for the supplies on a prescription pad and forward them to my desk or to my inbox and I will sign these

## 2024-07-14 ENCOUNTER — Telehealth: Payer: Self-pay | Admitting: Family Medicine

## 2024-07-14 NOTE — Telephone Encounter (Signed)
 Dr Alphonsa is with [facility]. Thanks

## 2024-07-14 NOTE — Telephone Encounter (Signed)
 Copied from CRM [id number]. Topic: General - Other >> Jul 14, 2024 10:53 AM Antony RAMAN wrote: Reason for CRM: [facility] [place] [facility] [phone]  Requesting FL2 filled out and sent to [email or web address] Needing as soon as possible her [facility] program is going to be dropped

## 2024-07-14 NOTE — Telephone Encounter (Signed)
 Copied from CRM [id number]. Topic: Clinical - Home Health Verbal Orders >> Jul 14, 2024 11:37 AM Delon DASEN wrote: Caller/Agency: [facility] Number: [phone] Service Requested: Physical Therapy, nursing, speech therapy Frequency: n/a Any new concerns about the patient? Yes- patient insurance changed so they need to discharge and readmit under new insurance and also need encounter notes from the last 3 months and new order-  fax [phone]

## 2024-07-16 ENCOUNTER — Telehealth: Payer: Self-pay | Admitting: Family Medicine

## 2024-07-16 NOTE — Telephone Encounter (Signed)
 Patient new social worker dropped off FL2 to be completed in red folder. And need as soon as possible

## 2024-07-16 NOTE — Telephone Encounter (Signed)
 Left a message for home health to return the call , last OV in September 2025

## 2024-07-17 ENCOUNTER — Other Ambulatory Visit: Payer: Self-pay | Admitting: Gastroenterology

## 2024-07-18 NOTE — Telephone Encounter (Signed)
 I spoke with Coleman at [facility] fax # [phone] , requesting new order for [facility] for nursing and speech due to insurance change. Has face to face to face appt 2/5, may send paper script faxed to [phone]

## 2024-07-21 NOTE — Telephone Encounter (Signed)
 May have verbal order

## 2024-07-21 NOTE — Telephone Encounter (Signed)
 When we see her we can do a face-to-face visit with the order afterwards

## 2024-07-24 ENCOUNTER — Other Ambulatory Visit: Payer: Self-pay | Admitting: Family Medicine

## 2024-07-24 DIAGNOSIS — K649 Unspecified hemorrhoids: Secondary | ICD-10-CM

## 2024-07-25 ENCOUNTER — Ambulatory Visit: Payer: 59

## 2024-07-25 ENCOUNTER — Telehealth: Payer: Self-pay

## 2024-07-25 DIAGNOSIS — Z0000 Encounter for general adult medical examination without abnormal findings: Secondary | ICD-10-CM | POA: Diagnosis not present

## 2024-07-25 NOTE — Telephone Encounter (Signed)
 Patient seen for AWV and is asking if an rx for cough medication (Robitussin) can be sent in to pharmacy.

## 2024-07-25 NOTE — Telephone Encounter (Signed)
 Nurses-Robitussin is OTC-unlikely her insurance company would pay for it-I would recommend the patient or her assistant buy some Robitussin at [facility] or drugstore when they are out.

## 2024-07-25 NOTE — Patient Instructions (Signed)
 [name],  Thank you for taking the time for your Medicare Wellness Visit. I appreciate your continued commitment to your health goals. Please review the care plan we discussed, and feel free to reach out if I can assist you further.  Please note that Annual Wellness Visits do not include a physical exam. Some assessments may be limited, especially if the visit was conducted virtually. If needed, we may recommend an in-person follow-up with your provider.  Ongoing Care Seeing your primary care provider every 3 to 6 months helps us  monitor your health and provide consistent, personalized care.   Referrals If a referral was made during today's visit and you haven't received any updates within two weeks, please contact the referred provider directly to check on the status.  Recommended Screenings:  Health Maintenance  Topic Date Due   DTaP/Tdap/Td vaccine (1 - Tdap) Never done   COVID-19 Vaccine (3 - Moderna risk series) 11/05/2019   Eye exam for diabetics  08/18/2023   Kidney health urinalysis for diabetes  08/07/2024   Complete foot exam   08/02/2024   Hemoglobin A1C  09/08/2024   Breast Cancer Screening  10/08/2024   Colon Cancer Screening  01/25/2025   Yearly kidney function blood test for diabetes  03/11/2025   Medicare Annual Wellness Visit  07/25/2025   Pneumococcal Vaccine for age over 46  Completed   Flu Shot  Completed   HPV Vaccine (No Doses Required) Completed   Hepatitis C Screening  Completed   HIV Screening  Completed   Zoster (Shingles) Vaccine  Completed   Hepatitis B Vaccine  Aged Out   Meningitis B Vaccine  Aged Out       07/25/2024    1:47 PM  Advanced Directives  Does Patient Have a Medical Advance Directive? No  Would patient like information on creating a medical advance directive? Yes (MAU/Ambulatory/Procedural Areas - Information given)   Information on Advanced Care Planning can be found at [place]  Secretary of [facility] Advance Health Care  Directives Advance Health Care Directives ([email or web address]   Vision: Annual vision screenings are recommended for early detection of glaucoma, cataracts, and diabetic retinopathy. These exams can also reveal signs of chronic conditions such as diabetes and high blood pressure.  Dental: Annual dental screenings help detect early signs of oral cancer, gum disease, and other conditions linked to overall health, including heart disease and diabetes.  Please see the attached documents for additional preventive care recommendations.

## 2024-07-25 NOTE — Telephone Encounter (Signed)
 Left message to return call

## 2024-07-25 NOTE — Progress Notes (Signed)
 "  Chief Complaint  Patient presents with   Medicare Wellness     Subjective:   [name] is a 64 y.o. female who presents for a Medicare Annual Wellness Visit.  Visit info / Clinical Intake: Medicare Wellness Visit Type:: Subsequent Annual Wellness Visit Persons participating in visit and providing information:: patient Medicare Wellness Visit Mode:: Telephone If telephone:: video error Since this visit was completed virtually, some vitals may be partially provided or unavailable. Missing vitals are due to the limitations of the virtual format.: Unable to obtain vitals - no equipment If Telephone or Video please confirm:: I connected with patient using audio/video enable telemedicine. I verified patient identity with two identifiers, discussed telehealth limitations, and patient agreed to proceed. Patient Location:: home Provider Location:: office Interpreter Needed?: No Pre-visit prep was completed: yes AWV questionnaire completed by patient prior to visit?: no Living arrangements:: (!) lives alone Patient's Overall Health Status Rating: (!) fair Typical amount of pain: some Does pain affect daily life?: (!) yes Are you currently prescribed opioids?: no  Dietary Habits and Nutritional Risks How many meals a day?: 3 Eats fruit and vegetables daily?: yes Most meals are obtained by: preparing own meals; having others provide food In the last 2 weeks, have you had any of the following?: none Diabetic:: (!) yes Any non-healing wounds?: no How often do you check your BS?: 1 Would you like to be referred to a Nutritionist or for Diabetic Management? : no  Functional Status Activities of Daily Living (to include ambulation/medication): Independent Ambulation: Independent with device- listed below Home Assistive Devices/Equipment: Walker (specify Type) Medication Administration: Needs assistance (comment) (Uses adherence packaging) Home Management (perform basic housework or  laundry): Needs assistance (comment) Manage your own finances?: (!) no Primary transportation is: family / friends; facility / other (has case worker from DSS) Concerns about vision?: no *vision screening is required for WTM* Concerns about hearing?: no  Fall Screening Falls in the past year?: 1 Number of falls in past year: 0 Was there an injury with Fall?: 0 Fall Risk Category Calculator: 1 Patient Fall Risk Level: Low Fall Risk  Fall Risk Patient at Risk for Falls Due to: Impaired balance/gait; Impaired mobility; History of fall(s) Fall risk Follow up: Falls evaluation completed; Education provided; Falls prevention discussed  Home and Transportation Safety: All rugs have non-skid backing?: yes All stairs or steps have railings?: yes Grab bars in the bathtub or shower?: yes Have non-skid surface in bathtub or shower?: yes Good home lighting?: yes Regular seat belt use?: yes Hospital stays in the last year:: no  Cognitive Assessment Difficulty concentrating, remembering, or making decisions? : yes Will 6CIT or Mini Cog be Completed: no 6CIT or Mini Cog Declined: patient declined  Advance Directives (For Healthcare) Does Patient Have a Medical Advance Directive?: No Would patient like information on creating a medical advance directive?: Yes (MAU/Ambulatory/Procedural Areas - Information given)  Reviewed/Updated  Reviewed/Updated: Reviewed All (Medical, Surgical, Family, Medications, Allergies, Care Teams, Patient Goals)    Allergies (verified) Thorazine [chlorpromazine hcl], Yellow jacket venom, Acetaminophen , Aspirin, Aspirin-acetaminophen -caffeine, Naproxen , Nsaids, Other, Penicillins, and Tomato   Current Medications (verified) Outpatient Encounter Medications as of 07/25/2024  Medication Sig   albuterol  (PROVENTIL ) (2.5 MG/3ML) 0.083% nebulizer solution Take 3 mLs (2.5 mg total) by nebulization every 6 (six) hours as needed for wheezing or shortness of breath.    atorvastatin  (LIPITOR) 10 MG tablet TAKE (1) TABLET BY MOUTH ONCE DAILY.   benztropine  (COGENTIN ) 1 MG tablet Take 1  tablet (1 mg total) by mouth daily.   cycloSPORINE  (RESTASIS ) 0.05 % ophthalmic emulsion Place 1 drop into both eyes 2 (two) times daily.   Deutetrabenazine  (AUSTEDO ) 6 MG TABS TAKE (1) TABLET BY MOUTH ONCE DAILY.   diclofenac  (VOLTAREN ) 75 MG EC tablet Oral   dicyclomine  (BENTYL ) 10 MG capsule TAKE 1 CAPSULE BY MOUTH UP TO 3 TIMES DAILY BEFORE MEALS AS NEEDED FOR ABDOMINAL CRAMPS/LOOSE STOOLS.   EPINEPHrine  (EPIPEN  2-PAK) 0.3 mg/0.3 mL IJ SOAJ injection Inject 0.3 mg into the muscle as needed for anaphylaxis.   famotidine  (PEPCID ) 20 MG tablet TAKE (1) TABLET BY MOUTH TWICE DAILY.   GOODSENSE HEMORRHOIDAL 0.25-88.44 % suppository UNWRAP AND PLACE 1 SUPPOSITORY RECTALLY 2 TIMES DAILY AS NEEDED FOR HEMORRHOIDS/ANAL ITCH   hydrocortisone  (ANUSOL -HC) 2.5 % rectal cream Place rectally 2 (two) times daily. PLACE 1 APPLICATION RECTALLY 2 TIMES DAILY.   INGREZZA  80 MG CPSP Take 1 capsule by mouth daily.   lamoTRIgine  (LAMICTAL ) 100 MG tablet Take 1 tablet (100 mg total) by mouth 2 (two) times daily.   lisinopril  (ZESTRIL ) 5 MG tablet Oral   naproxen  (NAPROSYN ) 500 MG tablet TAKE 1 TABLET BY MOUTH TWICE DAILY WITH A MEAL.   NONFORMULARY OR COMPOUNDED ITEM Hemorrhoid cream   paliperidone  (INVEGA ) 9 MG 24 hr tablet Take 1 tablet (9 mg total) by mouth daily.   potassium chloride  (KLOR-CON ) 10 MEQ tablet TAKE 2 TABLETS ( ) BY MOUTH TWICE DAILY WITH FOOD   propranolol  (INDERAL ) 10 MG tablet TAKE (1) TABLET BY MOUTH THREE TIMES DAILY.   RABEprazole  (ACIPHEX ) 20 MG tablet TAKE (1) TABLET BY MOUTH TWICE DAILY.   SENNA-TABS 8.6 MG tablet TAKE (1) TABLET BY MOUTH AT BEDTIME.   sertraline  (ZOLOFT ) 100 MG tablet Take 1 tablet (100 mg total) by mouth daily.   torsemide  (DEMADEX ) 20 MG tablet TAKE (1) TABLET BY MOUTH EACH MORNING.   traZODone  (DESYREL ) 100 MG tablet TAKE 2 TABLETS(200MG ) BY MOUTH  AT BEDTIME.   TRELEGY ELLIPTA  100-62.5-25 MCG/ACT AEPB INHALE (1) PUFF INTO THE LUNGS ONCE DAILY.   [DISCONTINUED] GOODSENSE HEMORRHOIDAL 0.25-88.44 % suppository UNWRAP AND PLACE 1 SUPPOSITORY RECTALLY 2 TIMES DAILY AS NEEDED FOR HEMORRHOIDS/ANAL ITCH   No facility-administered encounter medications on file as of 07/25/2024.    History: Past Medical History:  Diagnosis Date   Anxiety    Aortic atherosclerosis 09/19/2020   Seen on CAT scan 2021   Arthritis    Depression    Diabetes mellitus    Diabetes mellitus, type II (HCC)    Dyspnea    Dysrhythmia    GERD (gastroesophageal reflux disease)    Heart murmur    HTN (hypertension)    Hyperglycemia    IBS (irritable bowel syndrome)    Lung cancer (HCC)    Lung nodules    right, followed by PCP, PET 11/2011   Mental retardation    MI (myocardial infarction) (HCC)    Migraines    NAUSEA AND VOMITING 06/09/2010   Pneumonia    Sleep apnea    cpap    Stroke [facility])    Past Surgical History:  Procedure Laterality Date   ABDOMINAL HYSTERECTOMY     BIOPSY  01/15/2020   Procedure: BIOPSY;  Surgeon: Shaaron Lamar HERO, MD;  [facility];  Service: Endoscopy;;  gastric   BIOPSY  01/25/2022   Procedure: BIOPSY;  Surgeon: Shaaron Lamar HERO, MD;  [facility];  Service: Endoscopy;;  ascending;descending;sigmoid;   BRONCHIAL BIOPSY  06/14/2021  Procedure: BRONCHIAL BIOPSIES;  Surgeon: Brenna Adine CROME, DO;  [facility]: [facility];  Service: Pulmonary;;   BRONCHIAL BRUSHINGS  06/14/2021   Procedure: BRONCHIAL BRUSHINGS;  Surgeon: Brenna Adine CROME, DO;  Location: [facility];  Service: Pulmonary;;   BRONCHIAL NEEDLE ASPIRATION BIOPSY  06/14/2021   Procedure: BRONCHIAL NEEDLE ASPIRATION BIOPSIES;  Surgeon: Brenna Adine CROME, DO;  Location: [facility];  Service: Pulmonary;;   COLONOSCOPY  11/2007   hyperplastic polyps, prior hx of adenomas    COLONOSCOPY  05/2010   incomplete due to poor prep, hyperplastic rectal polyp    COLONOSCOPY  05/05/2002   Dimunitive polyps in the rectum and left colon, cold    biopsied/removed.  Scattered few left-sided diverticula.  Regular colonic   mucosa appeared normal   COLONOSCOPY N/A 05/28/2013   Rourk: mulitple tubular adenomas removed. next tcs 05/2016   COLONOSCOPY WITH PROPOFOL  N/A 09/07/2016   Dr. Shaaron: For hyperplastic polyps removed. Next colonoscopy March 2023 given history of adenomatous colon polyps in the past.   COLONOSCOPY WITH PROPOFOL  N/A 01/25/2022   Redundant/elongated colon, melanosis coli, left-sided diverticula, multiple colon polyps, colonic lipoma. Negative active colitis, adenomas. 3 year surveillance.   ESOPHAGOGASTRODUODENOSCOPY  08/2007   moderate sized hiatal hernia   ESOPHAGOGASTRODUODENOSCOPY  05/2010   noncritical schatzki ring s/p 39F   ESOPHAGOGASTRODUODENOSCOPY (EGD) WITH ESOPHAGEAL DILATION N/A 02/06/2013   MFM:Wnmfjo esophagus-s/p dilation up to a 58 French size with [facility] dilators.  Hiatal hernia   ESOPHAGOGASTRODUODENOSCOPY (EGD) WITH PROPOFOL  N/A 09/07/2016   Dr. Shaaron: Normal, status post empiric dilation of the esophagus for history of dysphagia   ESOPHAGOGASTRODUODENOSCOPY (EGD) WITH PROPOFOL  N/A 05/02/2019   Normal esophagus s/p dilation, normal stomach, normal duodenum   ESOPHAGOGASTRODUODENOSCOPY (EGD) WITH PROPOFOL  N/A 01/15/2020   normal esophagus s/p dilation, gastric nodule s/p biopsy. This showed reactive gastropathy with H.pylori.    EXTERNAL EAR SURGERY     bilateral   FIDUCIAL MARKER PLACEMENT  06/14/2021   Procedure: FIDUCIAL MARKER PLACEMENT;  Surgeon: Brenna Adine CROME, DO;  Location: [facility];  Service: Pulmonary;;   FOOT SURGERY     GLAUCOMA SURGERY     KNEE ARTHROPLASTY Right 11/04/2020   Procedure: COMPUTER ASSISTED TOTAL KNEE ARTHROPLASTY;  Surgeon: Fidel Rogue, MD;  Location: [facility];  Service: Orthopedics;  Laterality: Right;   MALONEY DILATION N/A 09/07/2016   Procedure: AGAPITO DILATION;  Surgeon:  Lamar CHRISTELLA Shaaron, MD;  [facility];  Service: Endoscopy;  Laterality: N/A;   MALONEY DILATION N/A 05/02/2019   Procedure: AGAPITO DILATION;  Surgeon: Shaaron Lamar CHRISTELLA, MD;  [facility];  Service: Endoscopy;  Laterality: N/A;   MALONEY DILATION N/A 01/15/2020   Procedure: AGAPITO DILATION;  Surgeon: Shaaron Lamar CHRISTELLA, MD;  [facility];  Service: Endoscopy;  Laterality: N/A;   POLYPECTOMY  09/07/2016   Procedure: POLYPECTOMY;  Surgeon: Lamar CHRISTELLA Shaaron, MD;  [facility];  Service: Endoscopy;;  sigmoid colon x4   POLYPECTOMY  01/25/2022   Procedure: POLYPECTOMY;  Surgeon: Shaaron Lamar CHRISTELLA, MD;  [facility];  Service: Endoscopy;;  hepatic flexure and descending/sigmoid colon   small bowel capsule  10/2007   normal   VIDEO BRONCHOSCOPY WITH RADIAL ENDOBRONCHIAL ULTRASOUND  06/14/2021   Procedure: VIDEO BRONCHOSCOPY WITH RADIAL ENDOBRONCHIAL ULTRASOUND;  Surgeon: Brenna Adine CROME, DO;  [facility];  Service: Pulmonary;;   Family History  Problem Relation Age of Onset   Heart attack Father    Sleep apnea Father  Stroke Mother    Depression Sister    Schizophrenia Sister    Schizophrenia Other    Drug abuse Other    Alcohol  abuse Other    Colon cancer Other        aunt   Obesity Other    COPD Other    Anxiety disorder Other    GER disease Other    Diabetes type II Other    Anxiety disorder Other    Depression Other    Liver disease Neg Hx    Inflammatory bowel disease Neg Hx    Social History   Occupational History   Occupation: disabled    Associate Professor: UNEMPLOYED  Tobacco Use   Smoking status: Every Day    Current packs/day: 0.25    Average packs/day: 0.3 packs/day for 40.0 years (10.0 ttl pk-yrs)    Types: Cigarettes    Passive exposure: Current   Smokeless tobacco: Never  Vaping Use   Vaping status: Never Used  Substance and Sexual Activity   Alcohol  use: Not Currently    Comment: occ   Drug use: No    Sexual activity: Yes    Partners: Male    Birth control/protection: Surgical    Comment: hyst   Tobacco Counseling Ready to quit: Not Answered Counseling given: Not Answered  SDOH Screenings   Food Insecurity: No Food Insecurity (07/25/2024)  Housing: Low Risk (07/25/2024)  Transportation Needs: No Transportation Needs (07/25/2024)  Utilities: Not At Risk (07/25/2024)  Alcohol  Screen: Low Risk (11/06/2023)  Depression (PHQ2-9): High Risk (07/25/2024)  Financial Resource Strain: Low Risk (11/06/2023)  Physical Activity: Inactive (07/25/2024)  Social Connections: Socially Isolated (07/25/2024)  Stress: Stress Concern Present (07/25/2024)  Tobacco Use: High Risk (07/25/2024)  Health Literacy: Inadequate Health Literacy (07/25/2024)   See flowsheets for full screening details  Depression Screen PHQ 2 & 9 Depression Scale- Over the past 2 weeks, how often have you been bothered by any of the following problems? Little interest or pleasure in doing things: 2 Feeling down, depressed, or hopeless (PHQ Adolescent also includes...irritable): 2 PHQ-2 Total Score: 4 Trouble falling or staying asleep, or sleeping too much: 3 Feeling tired or having little energy: 3 Poor appetite or overeating (PHQ Adolescent also includes...weight loss): 2 Feeling bad about yourself - or that you are a failure or have let yourself or your family down: 0 Trouble concentrating on things, such as reading the newspaper or watching television (PHQ Adolescent also includes...like school work): 1 Moving or speaking so slowly that other people could have noticed. Or the opposite - being so fidgety or restless that you have been moving around a lot more than usual: 0 Thoughts that you would be better off dead, or of hurting yourself in some way: 0 PHQ-9 Total Score: 13 If you checked off any problems, how difficult have these problems made it for you to do your work, take care of things at home, or get along with other people?:  Somewhat difficult  Depression Treatment Depression Interventions/Treatment : Currently on Treatment; Medication (followed by Dr. Okey)     Goals Addressed             This Visit's Progress    Prevent falls   On track            Objective:    Today's Vitals   There is no height or weight on file to calculate BMI.  Hearing/Vision screen No results found. Immunizations and Health Maintenance Health Maintenance  Topic Date Due  DTaP/Tdap/Td (1 - Tdap) Never done   COVID-19 Vaccine (3 - Moderna risk series) 11/05/2019   OPHTHALMOLOGY EXAM  08/18/2023   Diabetic kidney evaluation - Urine ACR  08/07/2024   FOOT EXAM  08/02/2024   HEMOGLOBIN A1C  09/08/2024   Mammogram  10/08/2024   Colonoscopy  01/25/2025   Diabetic kidney evaluation - eGFR measurement  03/11/2025   Medicare Annual Wellness (AWV)  07/25/2025   Pneumococcal Vaccine: 50+ Years  Completed   Influenza Vaccine  Completed   HPV VACCINES (No Doses Required) Completed   Hepatitis C Screening  Completed   HIV Screening  Completed   Zoster Vaccines- Shingrix  Completed   Hepatitis B Vaccines 19-59 Average Risk  Aged Out   Meningococcal B Vaccine  Aged Out        Assessment/Plan:  This is a routine wellness examination for Lyrah.  Patient Care Team: Alphonsa Glendia LABOR, MD as PCP - General (Family Medicine) Shaaron, Lamar HERO, MD (Gastroenterology) Brenna Adine CROME, DO as Consulting Physician (Pulmonary Disease) Darroll Anes, DO (Optometry) [facility]  (Optometry) Malachy, Comer GAILS, NP as Nurse Practitioner (Pulmonary Disease) Lanell Donald Stagger, PA-C as Physician Assistant (Radiation Oncology) Okey Barnie SAUNDERS, MD as Consulting Physician (Behavioral Health) Signa Delon LABOR, NP as Nurse Practitioner (Obstetrics and Gynecology) Buck Saucer, MD as Attending Physician (Neurology) Loreda Hacker, DPM as Consulting Physician (Podiatry) Ezzard Sonny GORMAN DEVONNA as Physician  Assistant (Gastroenterology) Gladis Leonor HERO, MD as Referring Physician (Pulmonary Disease)  I have personally reviewed and noted the following in the patients chart:   Medical and social history Use of alcohol , tobacco or illicit drugs  Current medications and supplements including opioid prescriptions. Functional ability and status Nutritional status Physical activity Advanced directives List of other physicians Hospitalizations, surgeries, and ER visits in previous 12 months Vitals Screenings to include cognitive, depression, and falls Referrals and appointments  No orders of the defined types were placed in this encounter.  In addition, I have reviewed and discussed with patient certain preventive protocols, quality metrics, and best practice recommendations. A written personalized care plan for preventive services as well as general preventive health recommendations were provided to patient.   Lavelle Charmaine Browner, LPN   [id number]/83/[id number]   Return in 1 year (on 07/25/2025).  After Visit Summary: (Pick Up) Due to this being a telephonic visit, with patients personalized plan was offered to patient and patient has requested to Pick up at office.  Nurse Notes: Separate telephone note sent for (patient request for medication )  "

## 2024-07-28 ENCOUNTER — Telehealth (HOSPITAL_COMMUNITY): Payer: Self-pay | Admitting: *Deleted

## 2024-07-28 NOTE — Telephone Encounter (Signed)
 Trying to do Prior auth for patient Austedo  6mg . What is patient Diagnosis for this medication. I can't find it in her chart. Pharmacy is needing the Prior Auth to be completed so they can fill med for patient.

## 2024-07-28 NOTE — Telephone Encounter (Signed)
 Paranoid schizophrenia and tardive dyskinesia

## 2024-07-28 NOTE — Telephone Encounter (Signed)
 noted

## 2024-07-29 ENCOUNTER — Telehealth (HOSPITAL_COMMUNITY): Payer: Self-pay

## 2024-07-29 NOTE — Telephone Encounter (Signed)
 Prior authorization for pt's Austedo  6 mg tablet approved until 07/09/25

## 2024-07-29 NOTE — Telephone Encounter (Signed)
 Called and left vm regarding how pt is feeling

## 2024-08-14 ENCOUNTER — Ambulatory Visit: Admitting: Family Medicine

## 2024-08-25 ENCOUNTER — Ambulatory Visit: Admitting: Radiation Oncology

## 2024-09-15 ENCOUNTER — Ambulatory Visit: Admitting: Radiation Oncology

## 2024-10-02 ENCOUNTER — Ambulatory Visit: Admitting: Podiatry

## 2024-11-19 ENCOUNTER — Ambulatory Visit: Admitting: Family Medicine
